# Patient Record
Sex: Female | Born: 1980 | Race: Black or African American | Hispanic: No | Marital: Married | Smoking: Never smoker
Health system: Southern US, Community
[De-identification: ages and names within clinical notes are randomized; demographics above are authoritative.]

## PROBLEM LIST (undated history)

## (undated) DIAGNOSIS — L259 Unspecified contact dermatitis, unspecified cause: Secondary | ICD-10-CM

## (undated) DIAGNOSIS — E669 Obesity, unspecified: Secondary | ICD-10-CM

## (undated) DIAGNOSIS — K469 Unspecified abdominal hernia without obstruction or gangrene: Secondary | ICD-10-CM

## (undated) DIAGNOSIS — L0501 Pilonidal cyst with abscess: Secondary | ICD-10-CM

## (undated) DIAGNOSIS — K5000 Crohn's disease of small intestine without complications: Secondary | ICD-10-CM

## (undated) DIAGNOSIS — L089 Local infection of the skin and subcutaneous tissue, unspecified: Secondary | ICD-10-CM

## (undated) DIAGNOSIS — J310 Chronic rhinitis: Secondary | ICD-10-CM

## (undated) DIAGNOSIS — N979 Female infertility, unspecified: Secondary | ICD-10-CM

## (undated) DIAGNOSIS — D649 Anemia, unspecified: Secondary | ICD-10-CM

## (undated) DIAGNOSIS — E559 Vitamin D deficiency, unspecified: Secondary | ICD-10-CM

## (undated) DIAGNOSIS — Z5189 Encounter for other specified aftercare: Secondary | ICD-10-CM

## (undated) DIAGNOSIS — B2790 Infectious mononucleosis, unspecified without complication: Secondary | ICD-10-CM

## (undated) DIAGNOSIS — K611 Rectal abscess: Secondary | ICD-10-CM

## (undated) DIAGNOSIS — K5090 Crohn's disease, unspecified, without complications: Secondary | ICD-10-CM

## (undated) DIAGNOSIS — S40812S Abrasion of left upper arm, sequela: Secondary | ICD-10-CM

## (undated) DIAGNOSIS — B019 Varicella without complication: Secondary | ICD-10-CM

## (undated) DIAGNOSIS — D219 Benign neoplasm of connective and other soft tissue, unspecified: Secondary | ICD-10-CM

## (undated) DIAGNOSIS — K432 Incisional hernia without obstruction or gangrene: Secondary | ICD-10-CM

## (undated) HISTORY — DX: Obesity, unspecified: E66.9

## (undated) HISTORY — PX: UPPER GASTROINTESTINAL ENDOSCOPY: SHX188

## (undated) HISTORY — DX: Female infertility, unspecified: N97.9

## (undated) HISTORY — DX: Infectious mononucleosis, unspecified without complication: B27.90

## (undated) HISTORY — DX: Vitamin D deficiency, unspecified: E55.9

## (undated) HISTORY — DX: Crohn's disease of small intestine without complications: K50.00

## (undated) HISTORY — DX: Unspecified contact dermatitis, unspecified cause: L25.9

## (undated) HISTORY — PX: KNEE ARTHROSCOPY: SUR90

## (undated) HISTORY — PX: WISDOM TOOTH EXTRACTION: SHX21

## (undated) HISTORY — DX: Encounter for other specified aftercare: Z51.89

## (undated) HISTORY — DX: Pilonidal cyst with abscess: L05.01

## (undated) HISTORY — PX: COLONOSCOPY: SHX174

## (undated) HISTORY — PX: PILONIDAL CYST EXCISION: SHX744

## (undated) HISTORY — DX: Rectal abscess: K61.1

## (undated) HISTORY — DX: Varicella without complication: B01.9

## (undated) HISTORY — DX: Incisional hernia without obstruction or gangrene: K43.2

## (undated) HISTORY — DX: Unspecified abdominal hernia without obstruction or gangrene: K46.9

## (undated) HISTORY — DX: Benign neoplasm of connective and other soft tissue, unspecified: D21.9

## (undated) HISTORY — DX: Crohn's disease, unspecified, without complications: K50.90

## (undated) HISTORY — DX: Chronic rhinitis: J31.0

---

## 2002-09-11 ENCOUNTER — Ambulatory Visit (HOSPITAL_BASED_OUTPATIENT_CLINIC_OR_DEPARTMENT_OTHER): Admission: RE | Admit: 2002-09-11 | Discharge: 2002-09-11 | Payer: Self-pay | Admitting: Ophthalmology

## 2004-11-25 HISTORY — PX: APPENDECTOMY: SHX54

## 2005-01-15 ENCOUNTER — Inpatient Hospital Stay (HOSPITAL_COMMUNITY): Admission: EM | Admit: 2005-01-15 | Discharge: 2005-01-28 | Payer: Self-pay | Admitting: Emergency Medicine

## 2005-01-18 ENCOUNTER — Ambulatory Visit: Payer: Self-pay | Admitting: Internal Medicine

## 2005-02-14 ENCOUNTER — Ambulatory Visit: Payer: Self-pay | Admitting: Internal Medicine

## 2005-02-20 ENCOUNTER — Ambulatory Visit (HOSPITAL_COMMUNITY): Admission: RE | Admit: 2005-02-20 | Discharge: 2005-02-20 | Payer: Self-pay | Admitting: General Surgery

## 2005-03-16 ENCOUNTER — Encounter (INDEPENDENT_AMBULATORY_CARE_PROVIDER_SITE_OTHER): Payer: Self-pay | Admitting: Specialist

## 2005-03-16 ENCOUNTER — Ambulatory Visit: Payer: Self-pay | Admitting: Gastroenterology

## 2005-03-16 DIAGNOSIS — K5289 Other specified noninfective gastroenteritis and colitis: Secondary | ICD-10-CM

## 2005-03-27 ENCOUNTER — Ambulatory Visit: Payer: Self-pay | Admitting: Internal Medicine

## 2005-04-26 ENCOUNTER — Ambulatory Visit: Payer: Self-pay | Admitting: Gastroenterology

## 2005-05-30 ENCOUNTER — Ambulatory Visit: Payer: Self-pay | Admitting: Gastroenterology

## 2005-06-29 ENCOUNTER — Ambulatory Visit: Payer: Self-pay | Admitting: Gastroenterology

## 2005-07-03 ENCOUNTER — Ambulatory Visit: Payer: Self-pay | Admitting: Gastroenterology

## 2005-07-20 ENCOUNTER — Ambulatory Visit: Payer: Self-pay | Admitting: Gastroenterology

## 2005-08-12 ENCOUNTER — Emergency Department (HOSPITAL_COMMUNITY): Admission: EM | Admit: 2005-08-12 | Discharge: 2005-08-12 | Payer: Self-pay | Admitting: Emergency Medicine

## 2005-08-14 ENCOUNTER — Ambulatory Visit: Payer: Self-pay | Admitting: Cardiology

## 2005-08-14 ENCOUNTER — Ambulatory Visit: Payer: Self-pay | Admitting: Gastroenterology

## 2005-08-15 ENCOUNTER — Ambulatory Visit: Payer: Self-pay | Admitting: Gastroenterology

## 2005-08-16 ENCOUNTER — Ambulatory Visit (HOSPITAL_COMMUNITY): Admission: RE | Admit: 2005-08-16 | Discharge: 2005-08-16 | Payer: Self-pay | Admitting: Gastroenterology

## 2005-08-29 ENCOUNTER — Ambulatory Visit: Payer: Self-pay | Admitting: Gastroenterology

## 2005-09-10 ENCOUNTER — Encounter (HOSPITAL_COMMUNITY): Admission: RE | Admit: 2005-09-10 | Discharge: 2005-12-09 | Payer: Self-pay | Admitting: Gastroenterology

## 2005-09-11 ENCOUNTER — Ambulatory Visit: Payer: Self-pay | Admitting: Gastroenterology

## 2005-11-12 ENCOUNTER — Ambulatory Visit: Payer: Self-pay | Admitting: Gastroenterology

## 2006-01-14 ENCOUNTER — Encounter (HOSPITAL_COMMUNITY): Admission: RE | Admit: 2006-01-14 | Discharge: 2006-02-08 | Payer: Self-pay | Admitting: Gastroenterology

## 2006-02-12 ENCOUNTER — Ambulatory Visit: Payer: Self-pay | Admitting: Gastroenterology

## 2006-03-27 ENCOUNTER — Encounter (HOSPITAL_COMMUNITY): Admission: RE | Admit: 2006-03-27 | Discharge: 2006-06-25 | Payer: Self-pay | Admitting: Gastroenterology

## 2006-04-29 ENCOUNTER — Ambulatory Visit: Payer: Self-pay | Admitting: Gastroenterology

## 2006-07-11 ENCOUNTER — Encounter (HOSPITAL_COMMUNITY): Admission: RE | Admit: 2006-07-11 | Discharge: 2006-10-09 | Payer: Self-pay | Admitting: Gastroenterology

## 2006-08-20 ENCOUNTER — Ambulatory Visit: Payer: Self-pay | Admitting: Gastroenterology

## 2006-11-05 ENCOUNTER — Ambulatory Visit: Payer: Self-pay | Admitting: Gastroenterology

## 2006-11-12 ENCOUNTER — Encounter (HOSPITAL_COMMUNITY): Admission: RE | Admit: 2006-11-12 | Discharge: 2007-02-10 | Payer: Self-pay | Admitting: Gastroenterology

## 2006-11-18 ENCOUNTER — Ambulatory Visit: Payer: Self-pay | Admitting: Internal Medicine

## 2006-11-18 LAB — CONVERTED CEMR LAB
AST: 21 units/L (ref 0–37)
Basophils Absolute: 0 10*3/uL (ref 0.0–0.1)
Bilirubin Urine: NEGATIVE
Bilirubin, Direct: 0.1 mg/dL (ref 0.0–0.3)
CO2: 30 meq/L (ref 19–32)
Chloride: 113 meq/L — ABNORMAL HIGH (ref 96–112)
Cholesterol: 164 mg/dL (ref 0–200)
Creatinine, Ser: 0.8 mg/dL (ref 0.4–1.2)
Eosinophils Absolute: 0.1 10*3/uL (ref 0.0–0.6)
GFR calc non Af Amer: 94 mL/min
Glucose, Bld: 79 mg/dL (ref 70–99)
HCT: 40.2 % (ref 36.0–46.0)
Hemoglobin: 13.9 g/dL (ref 12.0–15.0)
MCHC: 34.5 g/dL (ref 30.0–36.0)
MCV: 90.6 fL (ref 78.0–100.0)
Monocytes Absolute: 0.6 10*3/uL (ref 0.2–0.7)
Neutrophils Relative %: 62.3 % (ref 43.0–77.0)
Nitrite: NEGATIVE
RDW: 12.4 % (ref 11.5–14.6)
Sodium: 143 meq/L (ref 135–145)
TSH: 0.81 microintl units/mL (ref 0.35–5.50)
Total Bilirubin: 0.7 mg/dL (ref 0.3–1.2)
Total Protein, Urine: NEGATIVE mg/dL
Total Protein: 6.3 g/dL (ref 6.0–8.3)
pH: 6 (ref 5.0–8.0)

## 2006-12-02 ENCOUNTER — Ambulatory Visit: Payer: Self-pay | Admitting: Gastroenterology

## 2007-03-12 ENCOUNTER — Encounter (HOSPITAL_COMMUNITY): Admission: RE | Admit: 2007-03-12 | Discharge: 2007-05-01 | Payer: Self-pay | Admitting: Gastroenterology

## 2007-04-01 ENCOUNTER — Ambulatory Visit: Payer: Self-pay | Admitting: Gastroenterology

## 2007-05-14 DIAGNOSIS — K5000 Crohn's disease of small intestine without complications: Secondary | ICD-10-CM | POA: Insufficient documentation

## 2007-05-19 ENCOUNTER — Encounter (HOSPITAL_COMMUNITY): Admission: RE | Admit: 2007-05-19 | Discharge: 2007-05-28 | Payer: Self-pay | Admitting: Gastroenterology

## 2007-05-30 ENCOUNTER — Ambulatory Visit: Payer: Self-pay | Admitting: Internal Medicine

## 2007-05-30 LAB — CONVERTED CEMR LAB
Basophils Absolute: 0 10*3/uL (ref 0.0–0.1)
Eosinophils Absolute: 0.1 10*3/uL (ref 0.0–0.6)
MCHC: 34.4 g/dL (ref 30.0–36.0)
MCV: 91.2 fL (ref 78.0–100.0)
Mono Screen: NEGATIVE
Monocytes Relative: 9.4 % (ref 3.0–11.0)
Platelets: 353 10*3/uL (ref 150–400)
RBC: 4.77 M/uL (ref 3.87–5.11)
RDW: 12.8 % (ref 11.5–14.6)

## 2007-06-16 ENCOUNTER — Ambulatory Visit: Payer: Self-pay | Admitting: Internal Medicine

## 2007-07-07 ENCOUNTER — Ambulatory Visit: Payer: Self-pay | Admitting: Internal Medicine

## 2007-07-07 DIAGNOSIS — L259 Unspecified contact dermatitis, unspecified cause: Secondary | ICD-10-CM

## 2007-09-19 ENCOUNTER — Ambulatory Visit: Payer: Self-pay | Admitting: Gastroenterology

## 2007-09-19 LAB — CONVERTED CEMR LAB
ALT: 19 units/L (ref 0–35)
Basophils Absolute: 0 10*3/uL (ref 0.0–0.1)
Basophils Relative: 0.2 % (ref 0.0–1.0)
CO2: 29 meq/L (ref 19–32)
Calcium: 9.4 mg/dL (ref 8.4–10.5)
Chloride: 107 meq/L (ref 96–112)
GFR calc Af Amer: 113 mL/min
GFR calc non Af Amer: 94 mL/min
Glucose, Bld: 99 mg/dL (ref 70–99)
Hemoglobin: 13.9 g/dL (ref 12.0–15.0)
Lymphocytes Relative: 30.6 % (ref 12.0–46.0)
Monocytes Relative: 10.2 % (ref 3.0–12.0)
Neutro Abs: 5.4 10*3/uL (ref 1.4–7.7)
Neutrophils Relative %: 57.8 % (ref 43.0–77.0)
RBC: 4.5 M/uL (ref 3.87–5.11)
Sodium: 142 meq/L (ref 135–145)
Total Bilirubin: 0.6 mg/dL (ref 0.3–1.2)
Total Protein: 6.6 g/dL (ref 6.0–8.3)

## 2007-09-22 ENCOUNTER — Encounter: Payer: Self-pay | Admitting: Gastroenterology

## 2007-11-13 ENCOUNTER — Encounter: Payer: Self-pay | Admitting: Internal Medicine

## 2007-12-24 ENCOUNTER — Ambulatory Visit: Payer: Self-pay | Admitting: Internal Medicine

## 2008-02-17 ENCOUNTER — Ambulatory Visit: Payer: Self-pay | Admitting: Internal Medicine

## 2008-03-05 ENCOUNTER — Encounter: Payer: Self-pay | Admitting: Gastroenterology

## 2008-04-26 ENCOUNTER — Ambulatory Visit: Payer: Self-pay | Admitting: Gastroenterology

## 2008-06-10 ENCOUNTER — Encounter (INDEPENDENT_AMBULATORY_CARE_PROVIDER_SITE_OTHER): Payer: Self-pay | Admitting: *Deleted

## 2008-06-18 ENCOUNTER — Telehealth: Payer: Self-pay | Admitting: Gastroenterology

## 2008-06-25 ENCOUNTER — Ambulatory Visit: Payer: Self-pay | Admitting: Gastroenterology

## 2008-06-25 DIAGNOSIS — K5900 Constipation, unspecified: Secondary | ICD-10-CM | POA: Insufficient documentation

## 2008-06-29 ENCOUNTER — Ambulatory Visit: Payer: Self-pay | Admitting: Gastroenterology

## 2008-07-15 ENCOUNTER — Telehealth: Payer: Self-pay | Admitting: Internal Medicine

## 2008-07-16 ENCOUNTER — Ambulatory Visit: Payer: Self-pay | Admitting: Internal Medicine

## 2008-07-17 ENCOUNTER — Encounter: Payer: Self-pay | Admitting: Internal Medicine

## 2008-07-23 ENCOUNTER — Telehealth: Payer: Self-pay | Admitting: Internal Medicine

## 2008-08-03 ENCOUNTER — Ambulatory Visit: Payer: Self-pay | Admitting: Internal Medicine

## 2008-08-03 LAB — CONVERTED CEMR LAB
Basophils Absolute: 0.1 10*3/uL (ref 0.0–0.1)
Eosinophils Absolute: 0.3 10*3/uL (ref 0.0–0.7)
Lymphocytes Relative: 24.8 % (ref 12.0–46.0)
MCHC: 34 g/dL (ref 30.0–36.0)
MCV: 92.4 fL (ref 78.0–100.0)
Neutrophils Relative %: 67.9 % (ref 43.0–77.0)
Platelets: 299 10*3/uL (ref 150–400)
RBC: 4.39 M/uL (ref 3.87–5.11)
RDW: 12.9 % (ref 11.5–14.6)

## 2008-08-05 ENCOUNTER — Telehealth: Payer: Self-pay | Admitting: Internal Medicine

## 2008-08-06 ENCOUNTER — Telehealth: Payer: Self-pay | Admitting: Gastroenterology

## 2008-08-16 ENCOUNTER — Telehealth (INDEPENDENT_AMBULATORY_CARE_PROVIDER_SITE_OTHER): Payer: Self-pay | Admitting: *Deleted

## 2008-08-26 ENCOUNTER — Telehealth: Payer: Self-pay | Admitting: Gastroenterology

## 2008-09-03 ENCOUNTER — Ambulatory Visit: Payer: Self-pay | Admitting: Internal Medicine

## 2008-09-03 DIAGNOSIS — L52 Erythema nodosum: Secondary | ICD-10-CM

## 2008-09-03 DIAGNOSIS — J351 Hypertrophy of tonsils: Secondary | ICD-10-CM | POA: Insufficient documentation

## 2008-09-07 ENCOUNTER — Ambulatory Visit: Payer: Self-pay | Admitting: Internal Medicine

## 2008-09-10 ENCOUNTER — Telehealth (INDEPENDENT_AMBULATORY_CARE_PROVIDER_SITE_OTHER): Payer: Self-pay | Admitting: *Deleted

## 2008-09-15 ENCOUNTER — Telehealth: Payer: Self-pay | Admitting: Internal Medicine

## 2008-09-15 ENCOUNTER — Encounter: Payer: Self-pay | Admitting: Internal Medicine

## 2008-09-28 ENCOUNTER — Encounter (INDEPENDENT_AMBULATORY_CARE_PROVIDER_SITE_OTHER): Payer: Self-pay | Admitting: *Deleted

## 2008-11-23 ENCOUNTER — Telehealth: Payer: Self-pay | Admitting: Gastroenterology

## 2008-11-25 ENCOUNTER — Encounter: Payer: Self-pay | Admitting: Gastroenterology

## 2008-11-30 ENCOUNTER — Ambulatory Visit: Payer: Self-pay | Admitting: Gastroenterology

## 2008-12-15 ENCOUNTER — Telehealth (INDEPENDENT_AMBULATORY_CARE_PROVIDER_SITE_OTHER): Payer: Self-pay | Admitting: *Deleted

## 2009-01-10 ENCOUNTER — Telehealth: Payer: Self-pay | Admitting: Gastroenterology

## 2009-01-11 DIAGNOSIS — R109 Unspecified abdominal pain: Secondary | ICD-10-CM

## 2009-01-12 ENCOUNTER — Telehealth (INDEPENDENT_AMBULATORY_CARE_PROVIDER_SITE_OTHER): Payer: Self-pay | Admitting: *Deleted

## 2009-01-12 ENCOUNTER — Encounter: Payer: Self-pay | Admitting: Gastroenterology

## 2009-01-12 ENCOUNTER — Ambulatory Visit (HOSPITAL_COMMUNITY): Admission: RE | Admit: 2009-01-12 | Discharge: 2009-01-12 | Payer: Self-pay | Admitting: Gastroenterology

## 2009-01-13 ENCOUNTER — Ambulatory Visit: Payer: Self-pay | Admitting: Gastroenterology

## 2009-01-13 DIAGNOSIS — K5090 Crohn's disease, unspecified, without complications: Secondary | ICD-10-CM | POA: Insufficient documentation

## 2009-01-14 ENCOUNTER — Telehealth (INDEPENDENT_AMBULATORY_CARE_PROVIDER_SITE_OTHER): Payer: Self-pay | Admitting: *Deleted

## 2009-01-14 ENCOUNTER — Encounter: Payer: Self-pay | Admitting: Gastroenterology

## 2009-01-14 LAB — CONVERTED CEMR LAB
Basophils Absolute: 0 10*3/uL (ref 0.0–0.1)
Basophils Relative: 0.1 % (ref 0.0–3.0)
Eosinophils Relative: 1.1 % (ref 0.0–5.0)
HCT: 42.2 % (ref 36.0–46.0)
Hemoglobin: 14.1 g/dL (ref 12.0–15.0)
Lymphocytes Relative: 15.4 % (ref 12.0–46.0)
Lymphs Abs: 1.8 10*3/uL (ref 0.7–4.0)
Monocytes Relative: 7.8 % (ref 3.0–12.0)
Neutro Abs: 8.7 10*3/uL — ABNORMAL HIGH (ref 1.4–7.7)
RBC: 4.77 M/uL (ref 3.87–5.11)
RDW: 12.8 % (ref 11.5–14.6)
WBC: 11.5 10*3/uL — ABNORMAL HIGH (ref 4.5–10.5)

## 2009-02-08 ENCOUNTER — Ambulatory Visit: Payer: Self-pay | Admitting: Gastroenterology

## 2009-03-14 ENCOUNTER — Encounter: Payer: Self-pay | Admitting: Gastroenterology

## 2009-03-22 ENCOUNTER — Ambulatory Visit: Payer: Self-pay | Admitting: Gastroenterology

## 2009-04-25 ENCOUNTER — Ambulatory Visit: Payer: Self-pay | Admitting: Gastroenterology

## 2009-04-25 ENCOUNTER — Telehealth: Payer: Self-pay | Admitting: Gastroenterology

## 2009-04-27 LAB — CONVERTED CEMR LAB
BUN: 4 mg/dL — ABNORMAL LOW (ref 6–23)
Basophils Relative: 0 % (ref 0.0–3.0)
Chloride: 102 meq/L (ref 96–112)
Creatinine, Ser: 0.7 mg/dL (ref 0.4–1.2)
Eosinophils Relative: 0.7 % (ref 0.0–5.0)
GFR calc non Af Amer: 130.04 mL/min (ref >60)
Glucose, Bld: 85 mg/dL (ref 70–99)
Hemoglobin: 13.3 g/dL (ref 12.0–15.0)
MCV: 90.3 fL (ref 78.0–100.0)
Monocytes Absolute: 1 10*3/uL (ref 0.1–1.0)
Neutro Abs: 8.6 10*3/uL — ABNORMAL HIGH (ref 1.4–7.7)
Neutrophils Relative %: 76.1 % (ref 43.0–77.0)
Potassium: 3.8 meq/L (ref 3.5–5.1)
RBC: 4.32 M/uL (ref 3.87–5.11)
WBC: 11.3 10*3/uL — ABNORMAL HIGH (ref 4.5–10.5)

## 2009-04-29 ENCOUNTER — Ambulatory Visit: Payer: Self-pay | Admitting: Internal Medicine

## 2009-04-29 DIAGNOSIS — R1031 Right lower quadrant pain: Secondary | ICD-10-CM

## 2009-05-02 ENCOUNTER — Encounter: Payer: Self-pay | Admitting: Nurse Practitioner

## 2009-05-02 ENCOUNTER — Telehealth: Payer: Self-pay | Admitting: Nurse Practitioner

## 2009-05-05 ENCOUNTER — Telehealth: Payer: Self-pay | Admitting: Gastroenterology

## 2009-05-06 ENCOUNTER — Ambulatory Visit (HOSPITAL_COMMUNITY): Admission: RE | Admit: 2009-05-06 | Discharge: 2009-05-06 | Payer: Self-pay | Admitting: Gastroenterology

## 2009-05-06 ENCOUNTER — Telehealth: Payer: Self-pay | Admitting: Gastroenterology

## 2009-05-06 LAB — CONVERTED CEMR LAB
AST: 20 units/L (ref 0–37)
Albumin: 2.8 g/dL — ABNORMAL LOW (ref 3.5–5.2)
Alkaline Phosphatase: 44 units/L (ref 39–117)
Total Protein: 7.5 g/dL (ref 6.0–8.3)

## 2009-05-09 ENCOUNTER — Ambulatory Visit: Payer: Self-pay | Admitting: Cardiovascular Disease

## 2009-05-09 ENCOUNTER — Telehealth: Payer: Self-pay | Admitting: Gastroenterology

## 2009-05-09 ENCOUNTER — Telehealth (INDEPENDENT_AMBULATORY_CARE_PROVIDER_SITE_OTHER): Payer: Self-pay | Admitting: *Deleted

## 2009-05-10 ENCOUNTER — Inpatient Hospital Stay (HOSPITAL_COMMUNITY): Admission: AD | Admit: 2009-05-10 | Discharge: 2009-05-23 | Payer: Self-pay | Admitting: Gastroenterology

## 2009-05-10 ENCOUNTER — Ambulatory Visit: Payer: Self-pay | Admitting: Gastroenterology

## 2009-05-23 ENCOUNTER — Telehealth (INDEPENDENT_AMBULATORY_CARE_PROVIDER_SITE_OTHER): Payer: Self-pay | Admitting: *Deleted

## 2009-05-26 ENCOUNTER — Ambulatory Visit: Payer: Self-pay | Admitting: Gastroenterology

## 2009-05-26 ENCOUNTER — Inpatient Hospital Stay (HOSPITAL_COMMUNITY): Admission: RE | Admit: 2009-05-26 | Discharge: 2009-06-02 | Payer: Self-pay | Admitting: Gastroenterology

## 2009-05-26 ENCOUNTER — Telehealth: Payer: Self-pay | Admitting: Gastroenterology

## 2009-05-26 DIAGNOSIS — R935 Abnormal findings on diagnostic imaging of other abdominal regions, including retroperitoneum: Secondary | ICD-10-CM | POA: Insufficient documentation

## 2009-05-26 DIAGNOSIS — K5080 Crohn's disease of both small and large intestine without complications: Secondary | ICD-10-CM

## 2009-05-28 HISTORY — PX: OTHER SURGICAL HISTORY: SHX169

## 2009-06-02 ENCOUNTER — Encounter: Payer: Self-pay | Admitting: Gastroenterology

## 2009-06-07 ENCOUNTER — Ambulatory Visit: Payer: Self-pay | Admitting: Gastroenterology

## 2009-06-21 ENCOUNTER — Inpatient Hospital Stay (HOSPITAL_COMMUNITY): Admission: RE | Admit: 2009-06-21 | Discharge: 2009-06-27 | Payer: Self-pay | Admitting: General Surgery

## 2009-06-21 ENCOUNTER — Encounter (INDEPENDENT_AMBULATORY_CARE_PROVIDER_SITE_OTHER): Payer: Self-pay | Admitting: General Surgery

## 2009-07-12 ENCOUNTER — Ambulatory Visit: Payer: Self-pay | Admitting: Gastroenterology

## 2009-07-19 ENCOUNTER — Encounter (INDEPENDENT_AMBULATORY_CARE_PROVIDER_SITE_OTHER): Payer: Self-pay | Admitting: *Deleted

## 2009-07-19 ENCOUNTER — Telehealth: Payer: Self-pay | Admitting: Gastroenterology

## 2009-08-09 ENCOUNTER — Encounter: Payer: Self-pay | Admitting: Gastroenterology

## 2009-08-23 ENCOUNTER — Encounter: Payer: Self-pay | Admitting: Gastroenterology

## 2009-08-26 ENCOUNTER — Telehealth (INDEPENDENT_AMBULATORY_CARE_PROVIDER_SITE_OTHER): Payer: Self-pay | Admitting: *Deleted

## 2009-08-30 ENCOUNTER — Encounter (INDEPENDENT_AMBULATORY_CARE_PROVIDER_SITE_OTHER): Payer: Self-pay | Admitting: *Deleted

## 2009-09-05 ENCOUNTER — Encounter: Payer: Self-pay | Admitting: Gastroenterology

## 2009-09-09 ENCOUNTER — Ambulatory Visit: Payer: Self-pay | Admitting: Gastroenterology

## 2009-09-12 ENCOUNTER — Telehealth (INDEPENDENT_AMBULATORY_CARE_PROVIDER_SITE_OTHER): Payer: Self-pay | Admitting: *Deleted

## 2009-09-23 ENCOUNTER — Telehealth: Payer: Self-pay | Admitting: Gastroenterology

## 2009-10-26 ENCOUNTER — Ambulatory Visit: Payer: Self-pay | Admitting: Gastroenterology

## 2009-11-03 ENCOUNTER — Encounter: Payer: Self-pay | Admitting: Gastroenterology

## 2009-11-15 ENCOUNTER — Encounter (INDEPENDENT_AMBULATORY_CARE_PROVIDER_SITE_OTHER): Payer: Self-pay | Admitting: *Deleted

## 2009-12-06 ENCOUNTER — Encounter: Payer: Self-pay | Admitting: Gastroenterology

## 2010-02-23 ENCOUNTER — Encounter: Payer: Self-pay | Admitting: Internal Medicine

## 2010-02-24 ENCOUNTER — Ambulatory Visit: Payer: Self-pay | Admitting: Internal Medicine

## 2010-02-24 ENCOUNTER — Telehealth: Payer: Self-pay | Admitting: Gastroenterology

## 2010-02-24 ENCOUNTER — Telehealth: Payer: Self-pay | Admitting: *Deleted

## 2010-02-25 ENCOUNTER — Ambulatory Visit: Payer: Self-pay | Admitting: Family Medicine

## 2010-02-27 ENCOUNTER — Telehealth: Payer: Self-pay | Admitting: *Deleted

## 2010-03-03 ENCOUNTER — Ambulatory Visit: Payer: Self-pay | Admitting: Internal Medicine

## 2010-03-27 ENCOUNTER — Telehealth (INDEPENDENT_AMBULATORY_CARE_PROVIDER_SITE_OTHER): Payer: Self-pay | Admitting: *Deleted

## 2010-03-28 ENCOUNTER — Ambulatory Visit: Payer: Self-pay | Admitting: Internal Medicine

## 2010-04-07 ENCOUNTER — Ambulatory Visit: Payer: Self-pay | Admitting: Internal Medicine

## 2010-04-07 LAB — CONVERTED CEMR LAB
Albumin: 3.2 g/dL — ABNORMAL LOW (ref 3.5–5.2)
Alkaline Phosphatase: 55 units/L (ref 39–117)
BUN: 16 mg/dL (ref 6–23)
Basophils Relative: 0.7 % (ref 0.0–3.0)
Calcium: 8.7 mg/dL (ref 8.4–10.5)
Cholesterol: 154 mg/dL (ref 0–200)
Creatinine, Ser: 0.8 mg/dL (ref 0.4–1.2)
Eosinophils Absolute: 0.1 10*3/uL (ref 0.0–0.7)
GFR calc non Af Amer: 109.08 mL/min (ref >60)
HCT: 38.1 % (ref 36.0–46.0)
Hemoglobin: 12.8 g/dL (ref 12.0–15.0)
LDL Cholesterol: 92 mg/dL (ref 0–99)
Lymphocytes Relative: 33.9 % (ref 12.0–46.0)
MCHC: 33.6 g/dL (ref 30.0–36.0)
MCV: 88.5 fL (ref 78.0–100.0)
Neutro Abs: 4.4 10*3/uL (ref 1.4–7.7)
Nitrite: NEGATIVE
RBC: 4.31 M/uL (ref 3.87–5.11)
Specific Gravity, Urine: <1.005
Total Protein: 6.9 g/dL (ref 6.0–8.3)
WBC Urine, dipstick: NEGATIVE

## 2010-04-07 IMAGING — CT CT ABCESS DRAINAGE
1 of 4 series · 13 of 32 positions shown, 19 images · non-contrast
Comparison: none

CLINICAL DATA: Crohn's disease, recurrent right lower quadrant
subcutaneous abdominal wall abscess

[Series 2: rtn ap without · axial · non-contrast · 0.74mm/px · z∈[-484,-419]mm · 13 of 17 slices shown, 19 images]
[im 2/17  soft-tissue]
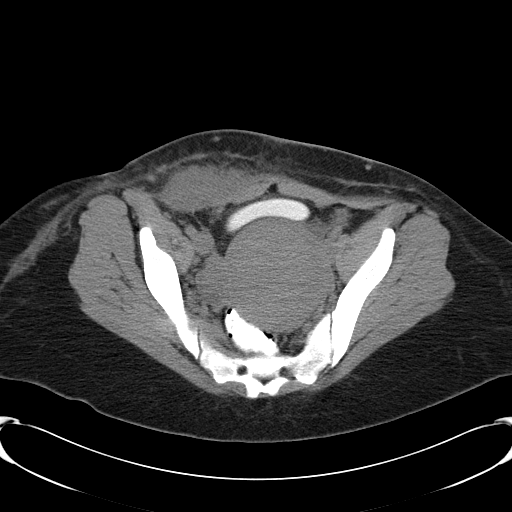
[im 2/17  bone]
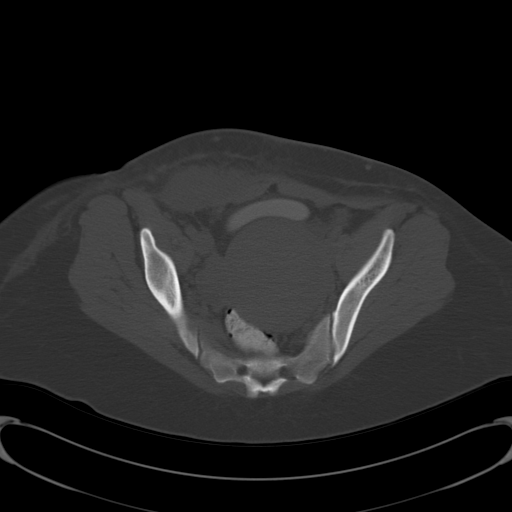
[im 3/17  soft-tissue]
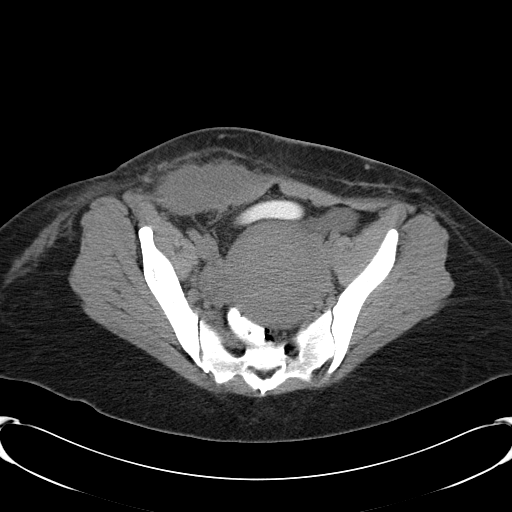
[im 4/17  soft-tissue]
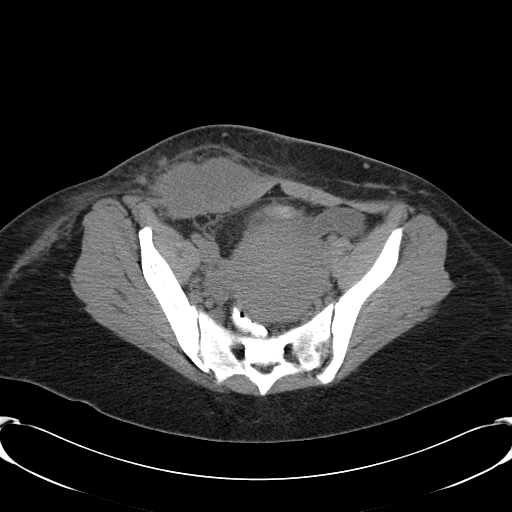
[im 5/17  soft-tissue]
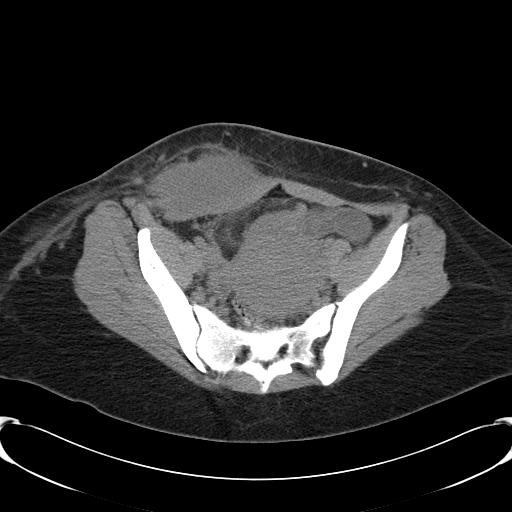
[im 6/17  soft-tissue]
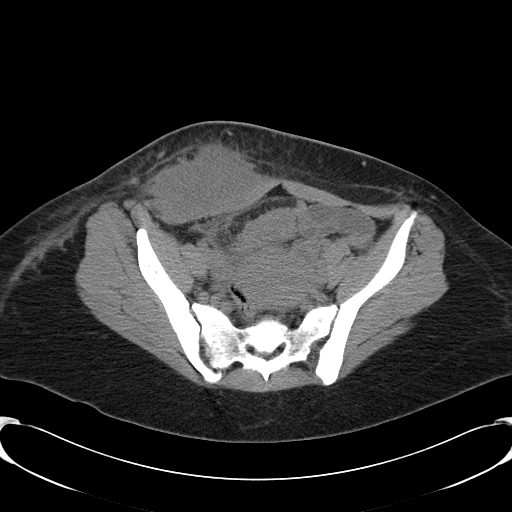
[im 7/17  soft-tissue]
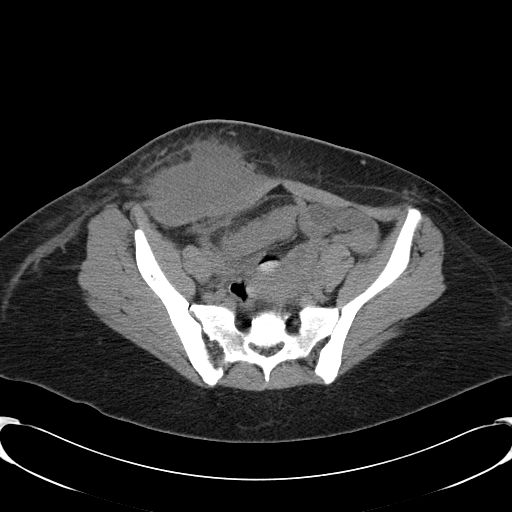
[im 9/17  soft-tissue]
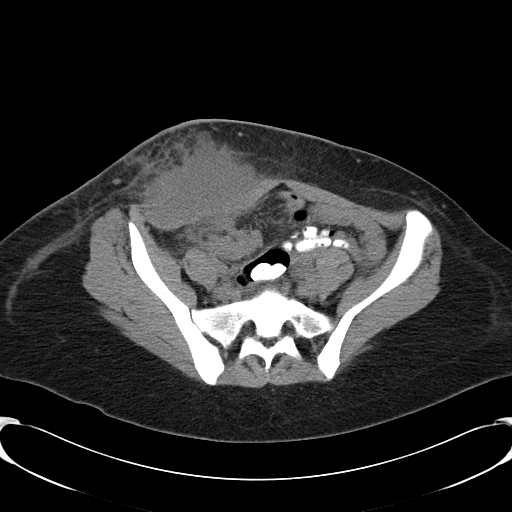
[im 10/17  soft-tissue]
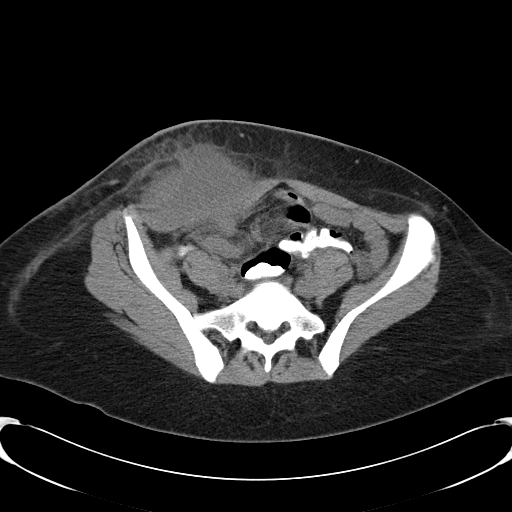
[im 11/17  soft-tissue]
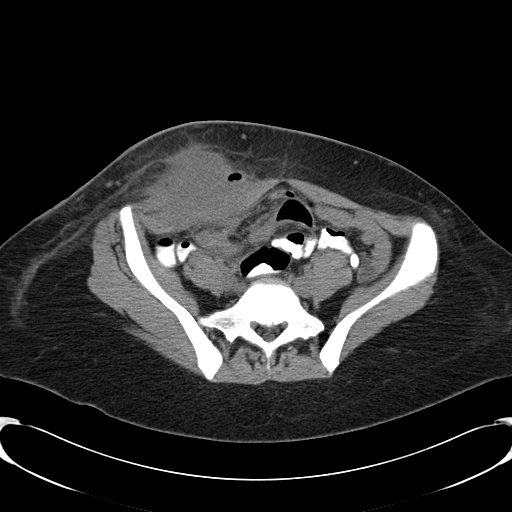
[im 11/17  bone]
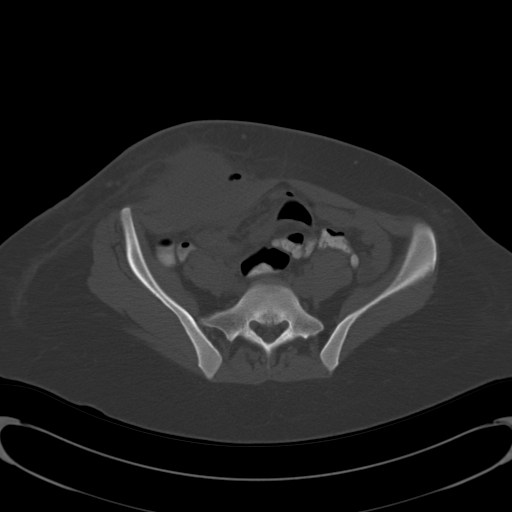
[im 12/17  soft-tissue]
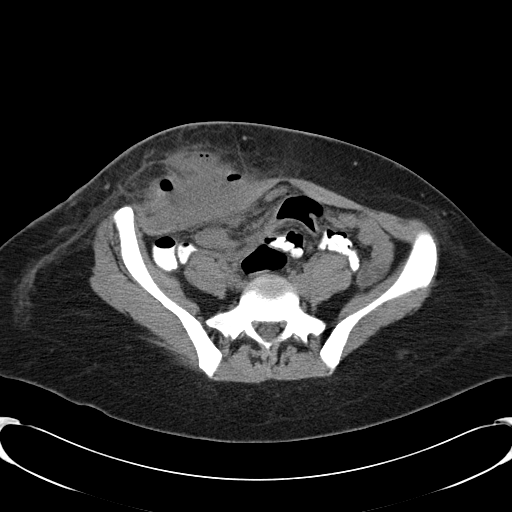
[im 12/17  lung]
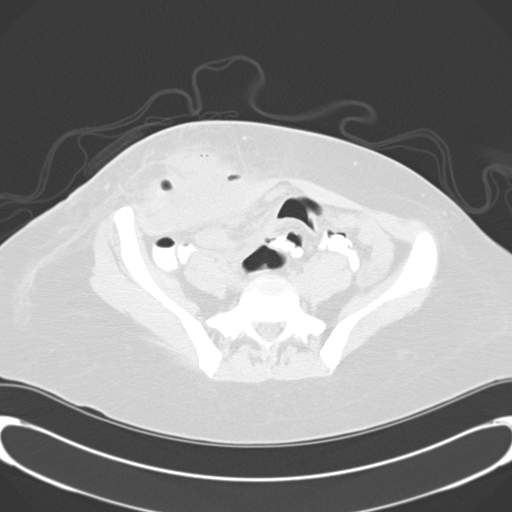
[im 13/17  soft-tissue]
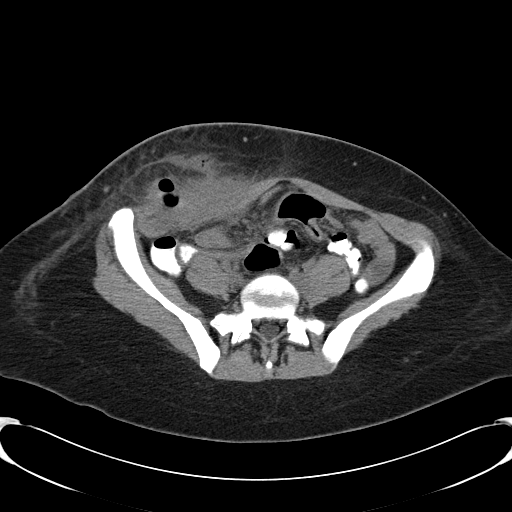
[im 13/17  lung]
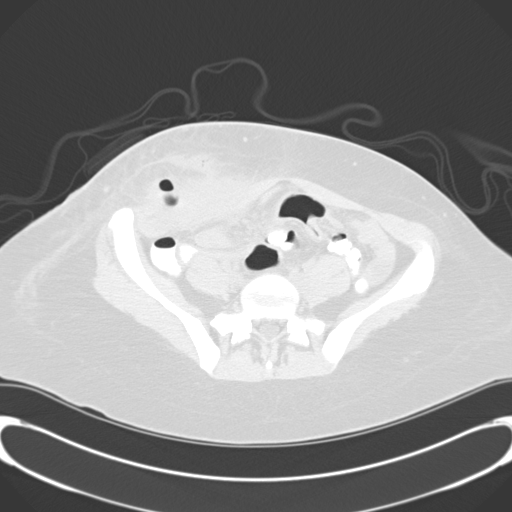
[im 14/17  soft-tissue]
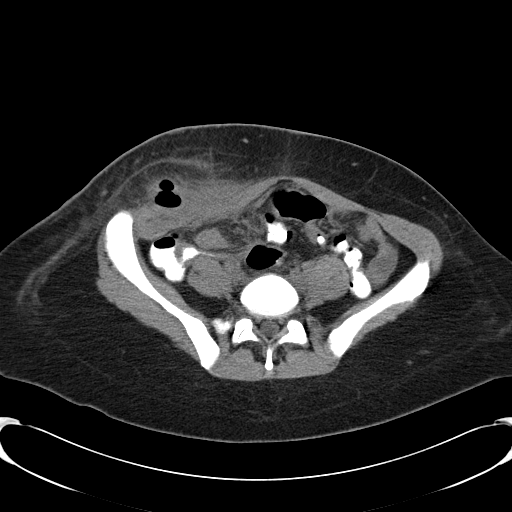
[im 14/17  lung]
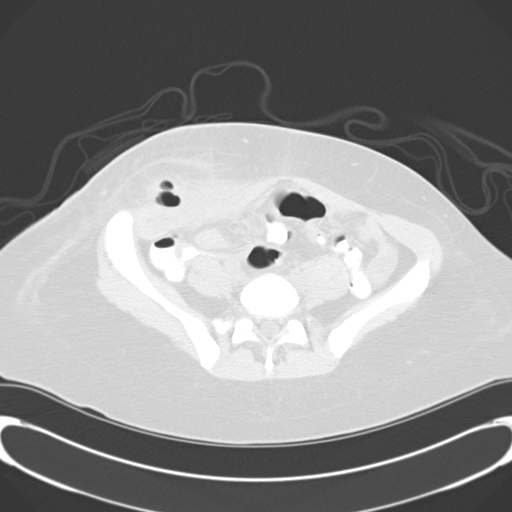
[im 15/17  soft-tissue]
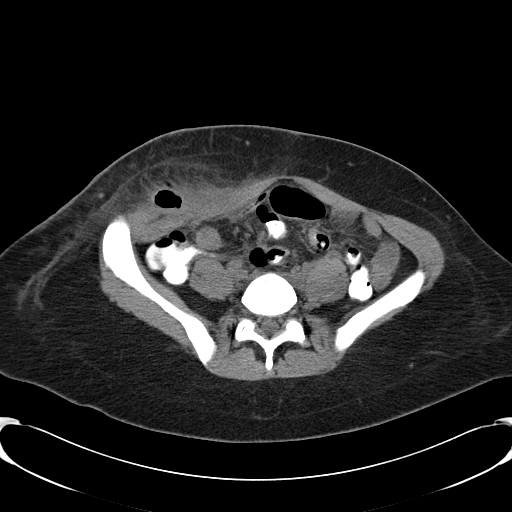
[im 15/17  lung]
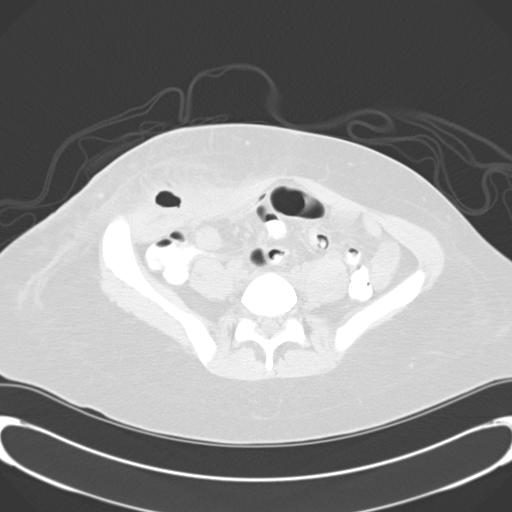

[13 of 32 positions shown; findings below may reference images not displayed]

CT GUIDED RIGHT LOWER QUADRANT ABSCESS DRAINAGE

Date:  05/27/2009 [DATE]

Radiologist:  Anar Makanova, M.D.

Medications:  2 mg Versed, 100 mcg Fentanyl

Guidance:  CT

Fluoroscopy time:  None.

Sedation time:  15 minutes

Contrast volume:  None.

Complications:  No immediate

PROCEDURE/FINDINGS:

Informed consent was obtained from the patient following
explanation of the procedure, risks, benefits and alternatives.
The patient understands, agrees and consents for the procedure.
All questions were addressed.  A time out was performed.

Maximal barrier sterile technique utilized including caps, mask,
sterile gowns, sterile gloves, large sterile drape, hand hygiene,
and betadine

Previous CT scan was reviewed demonstrating a recurrent right lower
quadrant subcutaneous abscess.

The patient was positioned supine.  Noncontrast localization CT was
performed through the abscess in the lower abdomen.  Under sterile
conditions and local anesthesia, an 18 gauge 10 cm access needle
was advanced from a right lower quadrant anterior oblique approach.
There was return of exudative fluid.  The guide wire was inserted
followed by tract dilatation to advance a 12-French drain.
Drainage catheter position in the abscess cavity was confirmed with
CT imaging.  Syringe aspiration yielded 100 ml of exudative fluid.
Samples sent for Gram stain culture.  Catheter secured with a
Prolene suture and connected to external drainage.  Sterile
dressing applied.  No immediate complication.  The patient
tolerated the procedure well.
IMPRESSION: CT guided right lower quadrant subcutaneous abscess drainage.

## 2010-04-18 ENCOUNTER — Ambulatory Visit: Payer: Self-pay | Admitting: Internal Medicine

## 2010-04-18 DIAGNOSIS — G4710 Hypersomnia, unspecified: Secondary | ICD-10-CM | POA: Insufficient documentation

## 2010-04-18 DIAGNOSIS — D259 Leiomyoma of uterus, unspecified: Secondary | ICD-10-CM

## 2010-04-18 DIAGNOSIS — L0501 Pilonidal cyst with abscess: Secondary | ICD-10-CM

## 2010-04-18 DIAGNOSIS — R946 Abnormal results of thyroid function studies: Secondary | ICD-10-CM

## 2010-05-02 ENCOUNTER — Encounter: Payer: Self-pay | Admitting: Gastroenterology

## 2010-05-16 ENCOUNTER — Encounter: Payer: Self-pay | Admitting: Internal Medicine

## 2010-05-18 ENCOUNTER — Telehealth: Payer: Self-pay | Admitting: Internal Medicine

## 2010-05-18 ENCOUNTER — Ambulatory Visit: Payer: Self-pay | Admitting: Internal Medicine

## 2010-05-18 DIAGNOSIS — G479 Sleep disorder, unspecified: Secondary | ICD-10-CM | POA: Insufficient documentation

## 2010-06-18 ENCOUNTER — Encounter: Payer: Self-pay | Admitting: Gastroenterology

## 2010-06-27 NOTE — Progress Notes (Signed)
Summary: TB skin test   Phone Note Outgoing Call Call back at [facility] Phone [phone]   Call placed by: Christian Mate CMA Deborra Medina),  September 12, 2009 2:57 PM Summary of Call: I called pt to remind her to come in and have her TB skin test read.  left message on machine to call back  Initial call taken by: Christian Mate CMA Deborra Medina),  September 12, 2009 2:58 PM

## 2010-06-27 NOTE — Miscellaneous (Signed)
Summary: [facility]   Imported By: Bubba Hales 08/15/2009 08:41:38  _____________________________________________________________________  External Attachment:    Type:   Image     Comment:   External Document

## 2010-06-27 NOTE — Letter (Signed)
Summary: [facility]   Imported By: Phillis Knack 01/06/2010 09:11:45  _____________________________________________________________________  External Attachment:    Type:   Image     Comment:   External Document

## 2010-06-27 NOTE — Progress Notes (Signed)
Summary: Return to work letter   Phone Note Call from Patient   Summary of Call: Johna walked in and is requesting a note to return to work on March 1.  Can I give this to her? Initial call taken by: Christian Mate CMA Deborra Medina),  July 19, 2009 3:13 PM  Follow-up for Phone Call        yes Follow-up by: Milus Banister MD,  July 19, 2009 3:26 PM

## 2010-06-27 NOTE — Assessment & Plan Note (Signed)
Summary: follow up on pneumonia/ssc   Vital Signs:  Patient profile:   30 year old female Menstrual status:  regular Weight:      131 pounds O2 Sat:      98 % on Room air Temp:     98.3 degrees F oral Pulse rate:   99 / minute BP sitting:   120 / 80  (left arm) Cuff size:   regular  Vitals Entered By: Sherron Monday, CMA (AAMA) (March 03, 2010 9:50 AM)  O2 Flow:  Room air CC: Follow-up visit on pneumonia. Pt states that she is feeling better but has a few coughing spells at night.   History of Present Illness: [name] comes in today  for follow up of pneumonia LUL  rx with levaquin.  is much better in energy 85% improved. Only cough at end of day 6 pm after work but not productive . had one episode of slight blood ( see phone note)    early on but none since. Back to work.  Asks about BF who is in chemo for lymphoma but doing ok. Next remicade in November per patient.    Preventive Screening-Counseling & Management  Alcohol-Tobacco     Alcohol drinks/day: 0     Smoking Status: never  Caffeine-Diet-Exercise     Caffeine use/day: 0     Does Patient Exercise: yes     Type of exercise: walking and aerobics     Exercise (avg: min/session): >60     Times/week: 2  Current Medications (verified): 1)  Multivitamins   Tabs (Multiple Vitamin) .... Take One By Mouth Once Daily 2)  Tri-Sprintec 0.18/0.215/0.25 Mg-35 Mcg Tabs (Norgestim-Eth Estrad Triphasic) .... As Directed 3)  Remicade 100 Mg Solr (Infliximab) .[name].. 61m/kg Every 8 Weeks  Allergies (verified): No Known Drug Allergies  Past History:  Past medical, surgical, family and social histories (including risk factors) reviewed for relevance to current acute and chronic problems.  Past Medical History: Reviewed history from 02/24/2010 and no changes required. RHINITIS /? 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 JElwyn ReachDr. Rowe-Rheu  Dr. RRiley NearingFoot specialist  Past Surgical History: Reviewed history from 02/24/2010 and no changes required. Knee Arthroscopy1998,1999,2002 Appendectomy ileocecal resection Jan 2011  Past History:  Care Management: Gastroenterology: JArdis HughsGynecology: RMancel BaleRheumatology:[place] Podiatry: UMarena Chancy  Family History: Reviewed history from 09/03/2008 and no changes required. Mother deceased secondary to brain aneurysm Father is age 6871and fairly healthy H30sister with diabetes and bipolar disorder Maternal grandmother with colon cancer Paternal grandfather with stomach cancer   Social History: Reviewed history from 09/03/2008 and no changes required. Single 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 in [employer]   Never Smoked Alcohol use-no Drug use-no hhof 1 no pets  sleep 6-7 hours           BF with lymphoma  on chemo. walking exercise.   Eats balanced   generally.   Review of Systems  The patient denies anorexia, fever, weight loss, weight gain, vision loss, peripheral edema, prolonged cough, headaches, hemoptysis, abdominal pain, abnormal bleeding, enlarged lymph nodes, and angioedema.    Physical Exam  General:  Well-developed,well-nourished,in no acute distress; alert,appropriate and cooperative throughout examination Neck:  No deformities, masses, or tenderness noted. Lungs:  Normal respiratory effort, chest expands  symmetrically. Lungs are clear to auscultation, no crackles or wheezes.no dullness.   Heart:  normal rate, regular rhythm, and no murmur.   Skin:  turgor normal and color normal.   Cervical Nodes:  No lymphadenopathy noted Psych:  Oriented X3, good eye contact, and not anxious appearing.     Impression &  Recommendations:  Problem # 1:  PNEUMONIA, LEFT UPPER LOBE (ICD-486) Assessment Improved much improved   slight decrease in energy and minor end of day cough .   but no systemic signs .   disc getting flu shot  ( usually gets after remicade ? novemnber )   at this time no need for follow up cxray as long as clinically improving  .  close follow up  discussed of any relapsing signs . Will reassess at  [facility].  The following medications were removed from the medication list:    Levaquin 750 Mg Tabs (Levofloxacin) .[name]... 1 by mouth once daily  Instructed patient to complete antibiotics, and call for worsened shortness of breath or new symptoms.   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 Take one by mouth once daily 2)  Tri-sprintec 0.18/0.215/0.25 Mg-35 Mcg Tabs (Norgestim-eth estrad triphasic) .... As directed 3)  Remicade 100 Mg Solr (Infliximab) .[name].. 4m/kg every 8 weeks  Appended Document: follow up on pneumonia/ssc thanks for forwarding this to me. Remicade probably puts her at risk for infections such as this pneumonia.  It is controlling her crohn's however, and I think she needs to stay on it. I will discuss perhaps lowering her dose a bit at next visit.

## 2010-06-27 NOTE — Letter (Signed)
Summary: Return to Work  [facility]  [address] [place], [place] [address]   Phone: [phone]  Fax: [phone]    07/19/2009  TO: Dorathy Daft IT MAY CONCERN  RE: [name] [address]   The above named individual is under my medical care and may return to work on:  July 26, 2009.    If you have any further questions or need additional information, please call.     Sincerely,   Owens Loffler MD typed by: Christian Mate CMA Deborra Medina)  Appended Document: Return to Work left at front desk for pt

## 2010-06-27 NOTE — Progress Notes (Signed)
Summary: Rectal abcess   Phone Note Call from Patient Call back at Home Phone [phone]   Caller: Patient Summary of Call: patient needs Dr. Theodis Shove to know she has a rectal abcess and she is on Doxycycline 100 take one by mouth two times a day x 10days (i added it to the med list) Initial call taken by: Bernita Buffy CMA Deborra Medina),  September 23, 2009 9:09 AM  Follow-up for Phone Call        Dr Jac Canavan:  Lamar Blinks just wanted you to be aware and see if you wanted anything else done for her. Follow-up by: Christian Mate CMA Deborra Medina),  September 23, 2009 9:27 AM  Additional Follow-up for Phone Call Additional follow up Details #1::        she needs rov with me in next 1-2 weeks Additional Follow-up by: Milus Banister MD,  Sep 26, 2009 7:24 AM    Additional Follow-up for Phone Call Additional follow up Details #2::    left message on machine to call back  [place] Deborra Medina)  Sep 26, 2009 8:29 AM   Spoke with the pt and she will call when she gets home from work and schedule the appt. Follow-up by: Christian Mate CMA Deborra Medina),  Sep 27, 2009 8:53 AM  New/Updated Medications: DOXYCYCLINE HYCLATE 100 MG CAPS (DOXYCYCLINE HYCLATE) take one by mouth two times a day X 10days

## 2010-06-27 NOTE — Assessment & Plan Note (Signed)
Summary: CPX/CJR   Vital Signs:  Patient profile:   30 year old female Menstrual status:  regular Weight:      189 pounds Temp:     98.5 degrees F oral Pulse rate:   60 / minute BP sitting:   120 / 80  (left arm) Cuff size:   regular  Vitals Entered By: Sherron Monday, CMA (AAMA) (April 18, 2010 8:58 AM) CC: Cyst on rt side of buttock inside. Pain started over the weekend and is now red and swollen. Hurts to set.   History of Present Illness: [name] comes in today  early for her appt as a walk in for  new onset of 2 days or buttock pain and  poss abscess.  No fever . hurts to sit NO idarrhea .   Didn get her remicade this am because of this.  she states she does not think recurrences flaring no fever change in bowel habits.  her respiratory infection see previous notes is resolved.  Has hx of I&D and packing  in April at an urgent care and then follow up with surgeon who said to follow and poss other intervention if recurring.  Her surgeon is Dr Zenia Resides.  Other concerns.  Issue with sleep ever since crohns surgery. can fall aslep but mind races in middle of night and acnt fo back . better on vacation other days.  NO alcohol or  issues .      Preventive Screening-Counseling & Management  Alcohol-Tobacco     Alcohol drinks/day: 0     Smoking Status: never  Caffeine-Diet-Exercise     Caffeine use/day: 0     Does Patient Exercise: yes     Type of exercise: walking and aerobics     Exercise (avg: min/session): >60     Times/week: 2  Current Medications (verified): 1)  Multivitamins   Tabs (Multiple Vitamin) .... Take One By Mouth Once Daily 2)  Tri-Sprintec 0.18/0.215/0.25 Mg-35 Mcg Tabs (Norgestim-Eth Estrad Triphasic) .... As Directed 3)  Remicade 100 Mg Solr (Infliximab) .[name].. 62m/kg Every 8 Weeks  Allergies (verified): No Known Drug Allergies  Past History:  Past medical, surgical, family and social histories (including risk factors) reviewed, and no changes  noted (except as noted below).  Past Medical History: Reviewed history from 03/28/2010 and no changes required. RHINITIS /? 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 JElwyn ReachDr. Rowe-Rheu  Dr. RRiley NearingFoot specialist  Past Surgical History: Reviewed history from 02/24/2010 and no changes required. Knee Arthroscopy1998,1999,2002 Appendectomy ileocecal resection Jan 2011  Past History:  Care Management: Gastroenterology: JArdis HughsGynecology: RMancel BaleRheumatology:[place] Podiatry: UMarena Chancy  Family History: Reviewed history from 09/03/2008 and no changes required. Mother deceased secondary to brain aneurysm Father is age 9133and fairly healthy H8sister with diabetes and bipolar disorder Maternal grandmother with colon cancer Paternal grandfather with stomach cancer   Neg for thyroid disease   Social History: Reviewed history from 03/03/2010 and no changes required. 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 walking exercise.   Eats balanced  generally.   Review of Systems  The patient denies anorexia, fever, weight loss, vision loss, hoarseness, chest pain, syncope, prolonged cough, melena, hematochezia, severe indigestion/heartburn, difficulty walking, abnormal bleeding, and enlarged lymph nodes.         apparently has fibroid that needs  medication treatment delayed  from summer because of various illnesses and  schedule  Physical Exam  General:  Well-developed,well-nourished,in no acute distress;  alert,appropriate and cooperative throughout examination Head:  normocephalic and atraumatic.   Eyes:  eoms  no lid lag  slightly prominent    no  stare  Ears:  R ear normal and L ear normal.   Neck:  palp no nodule Lungs:  normal respiratory effort, no intercostal retractions, no accessory muscle use, and normal breath sounds.   Heart:  normal rate, regular rhythm, and no murmur.   Abdomen:  Bowel sounds positive,abdomen soft and non-tender without masses, organomegaly or hernias noted. Msk:  no joint warmth and no redness over joints.   Pulses:  pulses intact without delay   Neurologic:  non focal grossly   no tremor  Skin:  buttock area  right isded pilonidal swelling edema and redness very tender    does not travel to rectal area .  central dimple and no discharge.  Old scar well healed  Cervical Nodes:  No lymphadenopathy noted Psych:  Oriented X3, normally interactive, good eye contact, not anxious appearing, and not depressed appearing.     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  because of upcoming holiday in our office is closed we've arranged for her to be seen by a surgeon's office today for probable I&D.   Problem # 2:  THYROID FUNCTION TEST, ABNORMAL (ICD-794.5) tsh low    no symptoms of hyper thyroid   ? if sleep related .   Will recheck tsh with free t4 t3  when gets remiicade or here before next visit .  disc  poss of  endo rx etc.       prescription written.  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   to try and reassess at follow up  check up   Complete Medication List: 1)  Multivitamins Tabs (Multiple vitamin) .... Take one by  mouth once daily 2)  Tri-sprintec 0.18/0.215/0.25 Mg-35 Mcg Tabs (Norgestim-eth estrad triphasic) .... As directed 3)  Remicade 100 Mg Solr (Infliximab) .[name].. 69m/kg every 8 weeks 4)  Lunesta 3 Mg Tabs (Eszopiclone) ..[name]. 1 by mouth hs as needed for sleep  Patient Instructions: 1)  See surgeon today for your pilonidal abscess.for reasons we discussed. 2)  YOur labs  are good excep t  your thyroid test is off  3)  Will need  repeat TSH  free T4 and free T3   at next lab draw .   794.5  4)  Dx abnromal thyroid tests. 5)  try lunesta for sleep as needed as we disscussed  6)  Schedule  check  up and follow up   December  22 thursday at 8 15 am    Orders Added: 1)  Est. Patient Level IV [[[id number]]

## 2010-06-27 NOTE — Progress Notes (Signed)
Summary: ov  Phone Note Call from Patient Call back at Home Phone [phone]   Caller: vm Summary of Call: Was just there pneumonia.  Same symptoms again & want to come in tomorrow am.   Initial call taken by: Shelbie Hutching, RN,  March 27, 2010 4:39 PM  Follow-up for Phone Call        Spoke to pt- Coughing, congestion, some SOB, feels feverish but unable to take temp. Pt is also having some wheezing.  Follow-up by: Sherron Monday, CMA Deborra Medina),  March 27, 2010 4:42 PM  Additional Follow-up for Phone Call Additional follow up Details #1::        Per Dr. Regis Bill- get a cxr, check temp tonight and can work in tomorrow. Pt aware and order put in for CXR. Appt made. Additional Follow-up by: Sherron Monday, CMA Deborra Medina),  March 27, 2010 4:54 PM

## 2010-06-27 NOTE — Assessment & Plan Note (Signed)
Summary: fu per doc panosh ?uri/njr   Vital Signs:  Patient profile:   30 year old female Menstrual status:  regular Height:      62 inches (157.48 cm) Weight:      129.31 pounds (58.78 kg) O2 Sat:      99 % on Room air Temp:     98.3 degrees F (36.83 degrees C) oral Pulse rate:   107 / minute BP sitting:   110 / 68  (left arm) Cuff size:   regular  Vitals Entered By: Gardenia Phlegm RMA (February 25, 2010 9:21 AM)  O2 Flow:  Room air CC: CT yesterday showed pneumonia- pt stated she started antibiotics (Levaquin) yesterday/ CF Is Patient Diabetic? No   History of Present Illness: Patient in today for reevaluation of a left sided pneumonia. she was seen yesterday with chills cough malaise, congestion. Was started on Levaquin. Since starting the Levaquin the chills have resolved. She is still having a mild intermittent cough. Fatigue is noted but no other concerning symptoms mild nasal congestion and some postnasal drips occurring. When she lies down between the postnasal drip and irritated cough he has trouble breathing slightly when she sits up the results. She denies chest pain, palpitations, shortness of breath or wheezing. No GI or GU c/o.  Current Medications (verified): 1)  Multivitamins   Tabs (Multiple Vitamin) .... Take One By Mouth Once Daily 2)  Tri-Sprintec 0.18/0.215/0.25 Mg-35 Mcg Tabs (Norgestim-Eth Estrad Triphasic) .... As Directed 3)  Remicade 100 Mg Solr (Infliximab) .[name].. 38m/kg Every 8 Weeks 4)  Levaquin 750 Mg Tabs (Levofloxacin) ..[name]. 1 By Mouth Once Daily  Allergies (verified): No Known Drug Allergies  Past History:  Past medical history reviewed for relevance to current acute and chronic problems. Social history (including risk factors) reviewed for relevance to current acute and chronic problems.  Past Medical History: Reviewed history from 02/24/2010 and no changes required. RHINITIS /? 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 JElwyn ReachDr. Rowe-Rheu  Dr. RVickey Hugerspecialist  Social History: Reviewed history from 09/03/2008 and no changes required. 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   Eats balanced   generally.   Review of Systems      See HPI  Physical Exam  General:  Well-developed,well-nourished,in no acute distress; alert,appropriate and cooperative throughout examination Head:  Normocephalic and atraumatic without obvious abnormalities. No apparent alopecia or balding. Ears:  External ear exam shows no significant lesions or deformities.  Otoscopic examination reveals clear canals, tympanic membranes are intact bilaterally without bulging, retraction, inflammation or discharge. Hearing is grossly normal bilaterally. Cerumen noted but not obstructed Mouth:  Oral mucosa and oropharynx without lesions or exudates.  Teeth in good repair. 2-3 + tonsils b/l. Neck:  No deformities, masses, or tenderness noted. Lungs:  Normal respiratory effort, chest expands symmetrically. Lungs are clear to auscultation, no crackles or wheezes. Heart:  Normal rate and regular rhythm. S1 and S2 normal without gallop, murmur, click, rub or other extra sounds. Abdomen:  Bowel sounds positive,abdomen soft and non-tender without masses, organomegaly or hernias noted. Extremities:  No clubbing, cyanosis, edema, or deformity  noted  Cervical Nodes:  R anterior LN tender and L anterior LN tender.   Psych:  Cognition and judgment appear intact. Alert and cooperative with normal attention span and concentration. No  apparent delusions, illusions, hallucinations   Impression & Recommendations:  Problem # 1:  PNEUMONIA, LEFT UPPER LOBE (ICD-486)  Her updated medication list for this problem includes:    Levaquin 750 Mg Tabs (Levofloxacin) .[name]... 1 by mouth once daily Doing better today, will keep out of work on 10/03 and she will notify us if she is unready to go back  Problem # 2:  Young (ICD-555.2) Encouraged daily probiotic while on antibiotics  Complete Medication List: 1)  Multivitamins Tabs (Multiple vitamin) .... Take one by mouth once daily 2)  Tri-sprintec 0.18/0.215/0.25 Mg-35 Mcg Tabs (Norgestim-eth estrad triphasic) .... As directed 3)  Remicade 100 Mg Solr (Infliximab) .[name].. 44m/kg every 8 weeks 4)  Levaquin 750 Mg Tabs (Levofloxacin) ..[name]. 1 by mouth once daily  Patient Instructions: 1)  Take your antibiotic as prescribed until ALL of it is gone, but stop if you develop a rash or swelling and contact our office as soon as possible.  2)  AcutePneumonia symptoms for less then 10 days are not  helped by antibiotics. Take over the counter cough medications. Call if no improvement in 5-7 days, sooner if increasing cough, fever, or new symptoms ( shortness of breath, chest pain) .  3)  Recommend a probiotic daily during the month you take an antibiotic, yogurt or Align caps

## 2010-06-27 NOTE — Progress Notes (Signed)
Summary: walk in   Phone Note Call from Patient Call back at [facility], [place] Phone [phone]   Caller: Patient Summary of Call: Pt walked in from GI- chills, slight fever. No abd. pain. Breathing feels heavy. Some coughing that started last night. This started today temp 99.6 pulse ox 98% room air p 100 bp 110/70 Meds up to date NKDA last infusion was 9/29 Initial call taken by: Sherron Monday, [place] ([place]),  February 24, 2010 12:19 PM  Follow-up for Phone Call        Pt seen Follow-up by: Sherron Monday, CMA (AAMA),  February 24, 2010 1:00 PM

## 2010-06-27 NOTE — Assessment & Plan Note (Signed)
Summary: Not any better/ssc   Vital Signs:  Patient profile:   30 year old female Menstrual status:  regular LMP:     03/06/2010 Weight:      184 pounds O2 Sat:      98 % on Room air Temp:     98.6 degrees F oral Pulse rate:   87 / minute BP sitting:   110 / 70  (left arm) Cuff size:   regular  Vitals Entered By: Sherron Monday, CMA (AAMA) (March 28, 2010 10:56 AM)  O2 Flow:  Room air CC: Still having dry coughing and extreme drainage. Sore throat this am but is feeling better now. LMP (date): 03/06/2010 LMP - Character: normal Menarche (age onset years): 13   Menses interval (days): 28-30 Menstrual flow (days): 7-9 Enter LMP: 03/06/2010   History of Present Illness: [name]  comes in with above concerns .    She had  onset with sore throat 3 days ago and took tylenol  cold and st went away and then got cough and congestion and end of work yestserday felt very tired and chilled    last pm.   and now some better today.   Cpncerned as this was similar to the symptoms she had with the penumonia.  See last visit. She has more of a st with this.    Exposed to school populations. No specific exposures. it hurts to swallow on the right and has some tender LN on that side .    Preventive Screening-Counseling & Management  Alcohol-Tobacco     Alcohol drinks/day: 0     Smoking Status: never  Caffeine-Diet-Exercise     Caffeine use/day: 0     Does Patient Exercise: yes     Type of exercise: walking and aerobics     Exercise (avg: min/session): >60     Times/week: 2  Current Medications (verified): 1)  Multivitamins   Tabs (Multiple Vitamin) .... Take One By Mouth Once Daily 2)  Tri-Sprintec 0.18/0.215/0.25 Mg-35 Mcg Tabs (Norgestim-Eth Estrad Triphasic) .... As Directed 3)  Remicade 100 Mg Solr (Infliximab) .[name].. 22m/kg Every 8 Weeks  Allergies (verified): No Known Drug Allergies  Past History:  Past medical, surgical, family and social histories (including  risk factors) reviewed, and no changes noted (except as noted below).  Past Medical History: RHINITIS /? 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 JElwyn ReachDr. Rowe-Rheu  Dr. RRiley NearingFoot specialist  Past Surgical History: Reviewed history from 02/24/2010 and no changes required. Knee Arthroscopy1998,1999,2002 Appendectomy ileocecal resection Jan 2011  Family History: Reviewed history from 09/03/2008 and no changes required. Mother deceased secondary to brain aneurysm Father is age 5724and fairly healthy H18sister with diabetes and bipolar disorder Maternal grandmother with colon cancer Paternal grandfather with stomach cancer   Social History: Reviewed history from 03/03/2010 and no changes required. 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 walking exercise.   Eats balanced   generally.   Review of Systems       The patient complains of anorexia and  enlarged lymph nodes.  The patient denies fever, weight gain, dyspnea on exertion, prolonged cough, hemoptysis, abdominal pain, transient blindness, difficulty walking, and angioedema.    Physical Exam  General:  alert, well-developed, and well-nourished.  midly congested  non toxic appearing. Head:  normocephalic and atraumatic.   Eyes:  PERRL, EOMs full, conjunctiva clear  Ears:  R ear normal, L ear normal, and no external deformities.   Nose:  no external deformity and no external erythema.  some congestion Mouth:  op right tonsil 1-2+ left 1+ right  slightly red with early bexudate Neck:  Mildly tender rightac node 1+  no pc or others  Lungs:  Normal respiratory effort, chest expands symmetrically. Lungs are clear to auscultation, no crackles or wheezes.no dullness.   Heart:  Normal rate and regular rhythm. S1 and S2 normal without gallop, murmur, click, rub or other extra sounds. Abdomen:  Bowel sounds positive,abdomen soft and non-tender without masses, organomegaly or  noted. Skin:  turgor normal, color normal, no ecchymoses, and no petechiae.   Cervical Nodes:  no posterior cervical adenopathy.  see neck exam  Psych:  Oriented X3, good eye contact, and not anxious appearing.   cxray is clear   Impression & Recommendations:  Problem # 1:  URI (ICD-465.9) acts viral and no signs of pneumonia .    Expectant management    Problem # 2:  ACUTE TONSILLITIS (ICD-463)   ?  she does have hypertrophy but now with  right midl adenopathy reactive  and early exudate   above illness seems viral but at risk with tonsillits  being bacterial cause of assymmetry  low threshhold to  add antibioitc because of this.   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 Take one by mouth once daily 2)  Tri-sprintec 0.18/0.215/0.25 Mg-35 Mcg Tabs (Norgestim-eth estrad triphasic) .... As directed 3)  Remicade 100 Mg Solr (Infliximab) .[name].. 80m/kg every 8 weeks 4)  Cephalexin 500 Mg Caps (Cephalexin) ..[name]. 1 by mouth two times a day for  right sided  tonsillitis  Patient Instructions: 1)  No pneumonia noted  and the coughing is probably from a viral chest cold that should get better on its own.  2)  However you have a right sided tonsilitis   and  would add antibioitc for this if not better in the next day.  3)  Call if fever or throat  getting  worse.   Prescriptions: CEPHALEXIN 500 MG CAPS (CEPHALEXIN) 1 by mouth  two times a day for  right sided  tonsillitis  #20 x 0   Entered and Authorized by:   WBurnis MedinMD   Signed by:   WBurnis MedinMD on 03/28/2010   Method used:   Electronically to        [facility] #[phone] (retail)       [address]      [place]  [address]      Ph: [phone] [phone]      Fax: [phone]  RxID:   [id number]   Orders Added: 1)  Est. Patient Level IV [[56433]

## 2010-06-27 NOTE — Letter (Signed)
Summary: [facility]   Imported By: Phillis Knack 09/22/2009 13:56:28  _____________________________________________________________________  External Attachment:    Type:   Image     Comment:   External Document

## 2010-06-27 NOTE — Letter (Signed)
Summary: Appointment Reminder  [facility]  [place], [place] [address]   Phone: [phone]  Fax: [phone]        August 30, 2009 MRN: [id number]    [address]  [place], [place]  [address]    Dear Ms. DAVIS,   We have been unable to reach you by phone to schedule a TB skin test  appointment that was recommended for you by Dr. Ardis Hughs.  It is very  important that we reach you to schedule an appointment. We hope that you  allow [name] to participate in your health care needs. Please contact us at  [phone] at your earliest convenience to schedule your appointment.     Sincerely,    Christian Mate CMA ([place])  Appended Document: Appointment Reminder letter mailed

## 2010-06-27 NOTE — Miscellaneous (Signed)
Summary: Med. Update  Clinical Lists Changes  Medications: Added new medication of AUGMENTIN 875-125 MG TABS (AMOXICILLIN-POT CLAVULANATE) Take one by mouth 2 times a day for 3 weeks. - Signed Added new medication of PROMETHAZINE HCL 25 MG TABS (PROMETHAZINE HCL) Take 1/2 to 1 tablet by mouth every 4-6 hours as needed for nausea/vomiting. - Signed Rx of AUGMENTIN 875-125 MG TABS (AMOXICILLIN-POT CLAVULANATE) Take one by mouth 2 times a day for 3 weeks.;  #42 x 0;  Signed;  Entered by: Vivia Ewing LPN;  Authorized by: Milus Banister MD;  Method used: Electronically to [facility] [phone]*, [address], [place], [place], [place]  [address], Ph: [phone] or [phone], Fax: [phone] Rx of PROMETHAZINE HCL 25 MG TABS (PROMETHAZINE HCL) Take 1/2 to 1 tablet by mouth every 4-6 hours as needed for nausea/vomiting.;  #30 x 0;  Signed;  Entered by: Vivia Ewing LPN;  Authorized by: Milus Banister MD;  Method used: Electronically to [facility] [phone]*, [address], [place], [place], [place]  [address], Ph: [phone] or [phone], Fax: [phone]    Prescriptions: PROMETHAZINE HCL 25 MG TABS (PROMETHAZINE HCL) Take 1/2 to 1 tablet by mouth every 4-6 hours as needed for nausea/vomiting.  #30 x 0   Entered by:   Vivia Ewing LPN   Authorized by:   Milus Banister MD   Signed by:   Vivia Ewing LPN on 17/61/6073   Method used:   Electronically to        [facility]. [phone]* (retail)       [address]       [place], [place]  [address]       Ph: [phone] or [phone]       Fax: [phone]   RxID:   [id number] AUGMENTIN 875-125 MG TABS (AMOXICILLIN-POT CLAVULANATE) Take one by mouth 2 times a day for 3 weeks.  #42 x 0   Entered by:   Vivia Ewing LPN   Authorized by:   Milus Banister MD   Signed by:   Vivia Ewing LPN on 35/36/1443   Method used:   Electronically to        [facility]. [phone]* (retail)       [address]       [place], [place]  [address]       Ph: [phone] or [phone]       Fax: [phone]   RxID:   [id number]

## 2010-06-27 NOTE — Assessment & Plan Note (Signed)
Review of gastrointestinal problems: 1. Crohn ileitis.  Presented like an acute appendicitis 2006.  Surgery, not in [place], was much more suggestive of Crohn's disease.  She did not respond to immunomodulators and so eventually she Began Remicade April 2007 While continuing on azathioprine.  Early 2008, stopped azathioprine and she tolerated solo maintenance Remicade (5 mg per kilogram) well. April 2009 continues to tolerate Remicade infusions very well, getting her infusions at Dr. Tanna Furry office.   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  Her Remicade dosing was increased to 10 mg per kilogram.  Winter, 2010 presented with right lower quadrant abscess, clear fistula connection between small bowel and the abscess. 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 Infection was drained with interventional radiology, drain eventually pulled however abscess recurred within days and so right lower quadrant drain was replaced.  January, 2011: she is on azathioprine 100 mg a day, Augmentin twice daily, planning for surgery late January. January, 2011: ileocecectomy performed for active disease, persistent fistula, abscess  History of Present Illness Visit Type: Follow-up Visit Primary GI MD: Owens Loffler MD Primary Provider: [name], MD Requesting Provider: n/a Chief Complaint: 2 month follow-up History of Present Illness:     has felt "fine" since last visit.  On 43m/kg every 8 weeks.  Most recent remicade was just last week.  Had labs drawn.  she has had no significant abdominal pains, no diarrhea. She has mild incisional tenderness that does not really bother her.           Current Medications (verified): 1)  Multivitamins   Tabs (Multiple Vitamin) .... Take One By Mouth Once Daily 2)  Loestrin 24 Fe 1-20 Mg-Mcg Tabs (Norethin Ace-Eth Estrad-Fe) ..[name]. 1 By  Mouth Once Daily 3)  Remicade 100 Mg Solr (Infliximab) ..[name]. 131mkg Every 8 Weeks  Allergies (verified): No Known Drug Allergies  Vital Signs:  Patient profile:   [age]ear old female Menstrual status:  regular Height:      62 inches Weight:      162 pounds BMI:     29.74 Pulse rate:   68 / minute Pulse rhythm:   regular BP sitting:   108 / 68  (left arm)  Vitals Entered By: BaRandye LoboCPriceApril 15, 2011 9:40 AM)  Physical Exam  Additional Exam:  Constitutional: generally well appearing Psychiatric: alert and oriented times 3 Abdomen: soft, non-tender, non-distended, normal bowel sounds    Impression & Recommendations:  Problem # 1:  Crohn's disease she is doing well on Remicade 10 mg per kilogram every 8 weeks. She will stay on this regimen. We will get recent labs that were drawn her last week sent over for our review. She will return to see me in 4 months and sooner if needed.  Patient Instructions: 1)  Continue remicade 10/mg/kg every 8 weeks. 2)  Return to see Dr. JaArdis Hughsn 4 months. 3)  We will recent labs sent over from Dr. RoTanna Furryffice. 4)  Call if any troubles. 5)  The medication list was reviewed and reconciled.  All changed / newly prescribed medications were explained.  A complete medication list was provided to the patient / caregiver.   Immunizations Administered:  PPD Skin Test:    Vaccine Type: PPD    Site: right forearm    Mfr: Sanofi Pasteur    Dose: 0.1 ml  Route: ID    Given by: Christian Mate CMA ([place])    Exp. Date: 10/23/2011    Lot #: [id number]  Appended Document: TB skin test read    Clinical Lists Changes  Observations: Added new observation of TB PPDRESULT: negative (09/12/2009 16:56) Added new observation of PPD RESULT: < 63m (09/12/2009 16:56) Added new observation of TB-PPD RDDTE: 09/12/2009 (09/12/2009 16:56)         PPD Results    Date of reading: 09/12/2009    Results: < 561m   Interpretation: negative

## 2010-06-27 NOTE — Assessment & Plan Note (Signed)
Review of gastrointestinal problems: 1. Crohn ileitis.  Presented like an acute appendicitis 2006.  Surgery, not in [place], was much more suggestive of Crohn's disease.  She did not respond to immunomodulators and so eventually she Began Remicade April 2007 While continuing on azathioprine.  Early 2008, stopped azathioprine and she tolerated solo maintenance Remicade (5 mg per kilogram) well. April 2009 continues to tolerate Remicade infusions very well, getting her infusions at Dr. Tanna Furry office.   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  Her Remicade dosing was increased to 10 mg per kilogram.  Winter, 2010 presented with right lower quadrant abscess, clear fistula connection between small bowel and the abscess. 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 Infection was drained with interventional radiology, drain eventually pulled however abscess recurred within days and so right lower quadrant drain was replaced.  January, 2011: she is on azathioprine 100 mg a day, Augmentin twice daily, planning for surgery late January.  History of Present Illness Visit Type: follow up  Primary GI MD: Owens Loffler MD Primary Provider: [name], MD Requesting Provider: n/a Chief Complaint: Post hosp f/u  History of Present Illness:     [name] was discharged from the hospital about a week ago. She is maintaining her right lower quadrant drain by flushing it twice a day. She has had no fevers or chills. Her right lower quadrant discomfort is much much improved. She is taking Augmentin twice daily. She is planning to have surgery with Dr. Zenia Resides in about 2 weeks' time. She will stay on antibiotics and continued drain care until then.           Current Medications (verified): 1)  Multivitamins   Tabs (Multiple Vitamin) .... Take One By Mouth Once Daily 2)  Loestrin 24  Fe 1-20 Mg-Mcg Tabs (Norethin Ace-Eth Estrad-Fe) .[name].. 1 By Mouth Once Daily 3)  Azathioprine 50 Mg Tabs (Azathioprine) .... Take 1 Tab Two Times A Day 4)  Augmentin 875-125 Mg Tabs (Amoxicillin-Pot Clavulanate) .... Take One By Mouth 2 Times A Day For 3 Weeks. 5)  Promethazine Hcl 25 Mg Tabs (Promethazine Hcl) .... Take 1/2 To 1 Tablet By Mouth Every 4-6 Hours As Needed For Nausea/vomiting. 6)  Ensure  Liqd (Nutritional Supplements) .... Three Times A Day  Allergies (verified): No Known Drug Allergies  Vital Signs:  Patient profile:   30 year old female Menstrual status:  regular Height:      62 inches Weight:      157 pounds BMI:     28.82 BSA:     1.73 Pulse rate:   112 / minute Pulse rhythm:   regular BP sitting:   98 / 62  (left arm) Cuff size:   regular  Vitals Entered By: Hope Pigeon CMA (June 07, 2009 10:27 AM)  Physical Exam  Additional Exam:  Constitutional: generally well appearing Psychiatric: alert and oriented times 3 Abdomen: soft,right lower quadrant drain with appropriate mild tenderness at the site. No suggestion of deep tissue infection., non-distended, normal bowel sounds    Impression & Recommendations:  Problem # 1:  Crohn's disease, right lower quadrant abscess,fistulas she will continue on antibiotics twice daily and we'll maintain care of the drain until the time of her surgery in about 2 weeks' time. She will continue taking azathioprine 100 mg a day until the time of surgery and will resume this medicine as  soon as she is reliably taking oral intake following surgery. She will return to my office in mid February which should be about 2 weeks after her surgery. She knows to get touch sooner if there are any issues in the surgeon's know how to contact the [facility] team during her hospital stay if that is required.  Patient Instructions: 1)  Stay on your augmentin twice daily. 2)  Continue drain care. 3)  Will plan that you restart the azathiaprine as  soon as you are eating, drinking reliably after the surgery. 4)  Return to see Dr. Ardis Hughs 2 weeks after surgery [facility] February). 5)  A copy of this information will be sent to Drs. [name], [employer]. 6)  The medication list was reviewed and reconciled.  All changed / newly prescribed medications were explained.  A complete medication list was provided to the patient / caregiver.

## 2010-06-27 NOTE — Progress Notes (Signed)
Summary: follow up request   Phone Note Call from Patient Call back at Home Phone [phone]   Caller: Patient Call For: Dr. Ardis Hughs Reason for Call: Talk to Nurse Summary of Call: pt walked in and asked to be seen right now... not feeling well after taking Remicade and wants to know if it is a drug reaction or Crohn's... Dr. Ardis Hughs is hospital doctor this week and , told pt that she could see her pcp and that I would have Patty call and touch base with her Initial call taken by: Lucien Mons,  February 24, 2010 11:44 AM  Follow-up for Phone Call        I spoke with  the pt and she is seeing Dr Regis Bill now, a chest xray has been ordered.  Dr Regis Bill thinks she has a URI.  I advised the pt to call back if Dr Regis Bill feels she needs to see a GI.  She agreed. Follow-up by: Christian Mate CMA Deborra Medina),  February 24, 2010 12:55 PM

## 2010-06-27 NOTE — Letter (Signed)
Summary: [facility]   Imported By: Phillis Knack 11/24/2009 08:12:01  _____________________________________________________________________  External Attachment:    Type:   Image     Comment:   External Document

## 2010-06-27 NOTE — Assessment & Plan Note (Signed)
Review of gastrointestinal problems: 1. Crohn ileitis.  Presented like an acute appendicitis 2006.  Surgery, not in [place], was much more suggestive of Crohn's disease.  She did not respond to immunomodulators and so eventually she Began Remicade April 2007 While continuing on azathioprine.  Early 2008, stopped azathioprine and she tolerated solo maintenance Remicade (5 mg per kilogram) well. April 2009 continues to tolerate Remicade infusions very well, getting her infusions at Dr. Tanna Furry office.   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  Her Remicade dosing was increased to 10 mg per kilogram.  Winter, 2010 presented with right lower quadrant abscess, clear fistula connection between small bowel and the abscess. 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 Infection was drained with interventional radiology, drain eventually pulled however abscess recurred within days and so right lower quadrant drain was replaced.  January, 2011: she is on azathioprine 100 mg a day, Augmentin twice daily, planning for surgery late January. January, 2011: ileocecectomy performed for active disease, persistent fistula, abscess 2. May, 2011: polynidol cyst drained by urgent care    History of Present Illness Visit Type: Follow-up Visit Primary GI MD: Owens Loffler MD Primary Provider: [name], MD Requesting Provider: n/a Chief Complaint: Colitis History of Present Illness:     very pleasant 30 year old woman whom I last saw 6 weeks ago.  she went home for mothers day, had pain in left buttocks.  Finally went to urgent care, incision/drainage, packing and salt baths.  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  Normal solid stools, non-bloody twice a day.  No abdominal pains.  Feels well. Last remicade 2 months ago, next is in one week.           Current Medications  (verified): 1)  Multivitamins   Tabs (Multiple Vitamin) .... Take One By Mouth Once Daily 2)  Tri-Sprintec 0.18/0.215/0.25 Mg-35 Mcg Tabs (Norgestim-Eth Estrad Triphasic) .... As Directed 3)  Remicade 100 Mg Solr (Infliximab) .[name].. 21m/kg Every 8 Weeks  Allergies (verified): No Known Drug Allergies  Vital Signs:  Patient profile:   30year old female Menstrual status:  regular Height:      62 inches Weight:      169.25 pounds BMI:     31.07 Pulse rate:   84 / minute Pulse rhythm:   regular BP sitting:   106 / 60  (left arm) Cuff size:   regular  Vitals Entered By: June McMurray CSummit(Deborra Medina (October 26, 2009 3:49 PM)  Physical Exam  Additional Exam:  Constitutional: generally well appearing Psychiatric: alert and oriented times 3 Abdomen: soft, non-tender, non-distended, normal bowel sounds Buttocks examination with female assistant in room: no fluctuance, slight linear scar at site of midline previous polynidol cyst. No sign of abscess.   Impression & Recommendations:  Problem # 1:  Crohn's disease symptoms under good control on an every 8 week 10 mg per kilogram Remicade. She will return to see me in 3 months.  Problem # 2:  recent poly-nidal cyst we will get records over from urgent care but it does sounds like that is what she had. Her symptoms are completely gone the site looks clear. She knows to call myself or her surgeon Dr. ARonnald Collumif symptoms return.  Patient Instructions: 1)  [facility] 2)  Return to see Dr. JArdis Hughsin 3 months.  3)  If symptoms return, call Dr. Zenia Resides or Dr. Ardis Hughs. 4)  The medication list was reviewed and reconciled.  All changed / newly prescribed medications were explained.  A complete medication list was provided to the patient / caregiver.

## 2010-06-27 NOTE — Progress Notes (Signed)
Summary: TB skin test reminder   Phone Note Outgoing Call Call back at [facility] Phone [phone]   Call placed by: Christian Mate CMA Deborra Medina),  August 26, 2009 1:42 PM Summary of Call: left message on machine to call back regarding annual TB skin test Initial call taken by: Christian Mate CMA Deborra Medina),  August 26, 2009 1:42 PM  Follow-up for Phone Call        left message on machine to call back [place] Deborra Medina)  August 29, 2009 8:37 AM   left message on machine to call back [place] Deborra Medina)  August 30, 2009 8:55 AM   letter mailed Follow-up by: Christian Mate CMA Deborra Medina),  August 30, 2009 8:55 AM

## 2010-06-27 NOTE — Letter (Signed)
Summary: Office Visit Letter  [facility]  [address] [place], [place] [address]   Phone: [phone]  Fax: [phone]      November 15, 2009 MRN: [id number]   [name]  [place], [place]  [address]   Dear Ms. DAVIS,   According to our records, it is time for you to schedule a follow-up office visit with [name] in the month of August 2011.   At your convenience, please call [phone] (option #2)to schedule an office visit. If you have any questions, concerns, or feel that this letter is in error, we would appreciate your call.   Sincerely,   Zannie Kehr, M.D.  [facility] [phone]

## 2010-06-27 NOTE — Assessment & Plan Note (Signed)
Summary: CHRONS FLARE UP OR POSSIBLE VIRUS//ALP   Vital Signs:  Patient profile:   30 year old female Menstrual status:  regular Height:      62 inches Weight:      169.25 pounds O2 Sat:      98 % on Room air Temp:     99.6 degrees F oral Pulse rate:   100 / minute BP sitting:   110 / 70  (left arm) Cuff size:   regular  Vitals Entered By: Sherron Monday, CMA (AAMA) (February 24, 2010 12:29 PM)  O2 Flow:  Room air CC: chills, slight fever. No abd. pain. Breathing feels heavy. Some coughing that started last night.   History of Present Illness: [name] comes in today  as a walk in  in patient  acute  ... see  triage note and GI note.   Had remicade infusion yesterday at [facility] for her crohns disease and was tole her blood tests were normal . however she develoed nausea chilles and sob and mild dry cough in  last nigh this am and  called Gi  was told to see pcp so walked in to clinic today .  No Vomiting diarrhea or bleeding .  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 slightly runny nose this am  and sob  no wheeze hemoptysis or sweats or inc LN    Had flare of crohns and abscess and surgery ileocolon resection   January 2011   has been stable in the past few months  . Last PPD was in March and negative .  Preventive Screening-Counseling & Management  Alcohol-Tobacco     Alcohol drinks/day: 0     Smoking Status: never  Caffeine-Diet-Exercise     Caffeine use/day: 0     Does Patient Exercise: yes     Type of exercise: walking and aerobics     Exercise (avg: min/session): >60     Times/week: 2  Current Medications (verified): 1)  Multivitamins   Tabs (Multiple Vitamin) .... Take One By Mouth Once Daily 2)  Tri-Sprintec 0.18/0.215/0.25 Mg-35 Mcg Tabs (Norgestim-Eth Estrad Triphasic) .... As Directed 3)  Remicade 100 Mg Solr (Infliximab) .[name].. 39m/kg Every 8 Weeks  Allergies (verified): No Known Drug Allergies  Past History:  Past medical, surgical, family  and social histories (including risk factors) reviewed, and no changes noted (except as noted below).  Past Medical History: RHINITIS /? 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 JElwyn ReachDr. Rowe-Rheu  Dr. RRiley NearingFoot specialist  Past Surgical History: Knee Arthroscopy1998,1999,2002 Appendectomy ileocecal resection Jan 2011  Past History:  Care Management: Gastroenterology: JArdis HughsGynecology: RMancel BaleRheumatology:[place] Podiatry: UMarena Chancy  Family History: Reviewed history from 09/03/2008 and no changes required. Mother deceased secondary to brain aneurysm Father is age 5047and fairly healthy H56sister with diabetes and bipolar disorder Maternal grandmother with colon cancer Paternal grandfather with stomach cancer   Social History: Reviewed history from 09/03/2008 and no changes required. 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   Eats balanced   generally.   Review of Systems  The patient denies vision loss, decreased hearing,  hoarseness, chest pain, syncope, peripheral edema, prolonged cough, abdominal pain, melena, hematochezia, hematuria, muscle weakness, transient blindness, difficulty walking, unusual weight change, abnormal bleeding, enlarged lymph nodes, and angioedema.         malaise fatigue chills no vomiting or diarrhea or urine uti signs    no leg pain or swelling    rest of  12 system reivew  neg or as   per hpi   Physical Exam  General:  mildly ill in nad  slight dypnea resolve with talking and  exam  nasally congested  Head:   Normocephalic and atraumatic without obvious abnormalities. No apparent alopecia or balding. Eyes:  clear  eoms nl no injection.   Ears:  R ear normal, L ear normal, and no external deformities.   Nose:  no external deformity and no external erythema.  congested no face pain  Mouth:  tonsils 1 + no redness or exudate    teeeth in ok repair  Neck:  soddy ac tender nodes no pc nodes  Lungs:  Normal respiratory effort, chest expands symmetrically. Lungs are clear to auscultation, no crackles or wheezes.no dullness.   ? if decrease bs   at bases dry irritative cough noted  Heart:  Normal rate and regular rhythm. S1 and S2 normal without gallop, murmur, click, rub or other extra sounds. Abdomen:  soft, non-tender, normal bowel sounds, no distention, no masses, no guarding, no hepatomegaly, and no splenomegaly.   Msk:  no joint swelling, no joint warmth, and no redness over joints.   Pulses:  nl cap perfusion intact  Extremities:  no clubbing cyanosis or edema  Neurologic:  non focal   alert & oriented X3, strength normal in all extremities, and gait normal.   nl balance Skin:  turgor normal, color normal, no ecchymoses, and no petechiae.   Cervical Nodes:  no posterior cervical adenopathy.  shoddy ac nodes  Psych:  Oriented X3, good eye contact, not anxious appearing, and not depressed appearing.     Impression & Recommendations:  Problem # 1:  COUGH (XFG-182.2) probably  rti    high risk situation for infecxtionr/o pneumonia other process with hx of remicade   and dyspnea. 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 Take one by mouth once daily 2)  Tri-sprintec 0.18/0.215/0.25 Mg-35 Mcg Tabs (Norgestim-eth estrad triphasic) .... As directed 3)  Remicade 100 Mg Solr (Infliximab) .[name].. 78m/kg every 8 weeks 4)  Levaquin 750 Mg Tabs (Levofloxacin) ..[name]. 1 by mouth once daily  Patient Instructions: 1)  get the chest x ray and will call you with  results and plan 2)  this seems like a respiratory infection  poss viral but  concern about bacterial infection also. 3)  Make appt saturday clinic for tomorrow   [facility]ed [facility] closed but called DR KMaudie Mercuryon call who was able to look up copy  of her blood tests and her wbc was  8.2  and nl diff .  Yesterday.   results will be faxed to [name].   See x ray report   probable  LUL pneumonia  and rx with levaquin and seek emergent care if  worse . otherwise keep sat am appt.

## 2010-06-27 NOTE — Progress Notes (Signed)
Summary: cough up a little blood  Phone Note Call from Patient Call back at Home Phone [phone]   Caller: Patient Call For: Burnis Medin MD Summary of Call: pt was seen friday 02-24-2010.  pt cough up a little blood today should she be concern? Initial call taken by: Glo Herring,  February 27, 2010 9:58 AM  Follow-up for Phone Call        have triage or clinical person call for more information  and ask when her follow up visit is. Follow-up by: Burnis Medin MD,  February 27, 2010 1:15 PM  Additional Follow-up for Phone Call Additional follow up Details #1::        Spoke to pt and it was a small amount of blood. Pt states that she was coughing up congestion when she saw some blood. No sob, wheezing, dizziness or passing out. PT states that it just happened the one time. Additional Follow-up by: Sherron Monday, CMA (AAMA),  February 27, 2010 1:32 PM    Additional Follow-up for Phone Call Additional follow up Details #2::    if fever still gone ok to wait until follow up  on friday .  if keeps happening .  call back Follow-up by: Burnis Medin MD,  February 27, 2010 4:55 PM  Additional Follow-up for Phone Call Additional follow up Details #3:: Details for Additional Follow-up Action Taken: Pt aware and is not having fever. She will call us back if she keeps this up. Additional Follow-up by: Sherron Monday, CMA (AAMA),  February 27, 2010 4:57 PM

## 2010-06-27 NOTE — Assessment & Plan Note (Signed)
Review of gastrointestinal problems: 1. Crohn ileitis.  Presented like an acute appendicitis 2006.  Surgery, not in [place], was much more suggestive of Crohn's disease.  She did not respond to immunomodulators and so eventually she Began Remicade April 2007 While continuing on azathioprine.  Early 2008, stopped azathioprine and she tolerated solo maintenance Remicade (5 mg per kilogram) well. April 2009 continues to tolerate Remicade infusions very well, getting her infusions at Dr. [name] office.   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  Her Remicade dosing was increased to 10 mg per kilogram.  Winter, 2010 presented with right lower quadrant abscess, clear fistula connection between small bowel and the abscess. 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 Infection was drained with interventional radiology, drain eventually pulled however abscess recurred within days and so right lower quadrant drain was replaced.  January, 2011: she is on azathioprine 100 mg a day, Augmentin twice daily, planning for surgery late January. January, 2011: ileocecectomy performed for active disease, persistent fistula, abscess    History of Present Illness Visit Type: Follow-up Visit Primary GI MD: Donna Loffler MD Primary Provider: [name], MD Requesting Provider: n/a Chief Complaint: F/u visit  History of Present Illness:     very pleasant 30 year old woman whom I last saw about 6 weeks ago, prior to her ileocecectomy.  s/p ileocecectomy last month.  Did well overall, discharged after about one week.  Since leaving the hospital she is finally starting to feel back to normal.  She is getting more active, not needing to nap.           Current Medications (verified): 1)  Multivitamins   Tabs (Multiple Vitamin) .... Take One By Mouth Once Daily 2)  Loestrin 24 Fe 1-20  Mg-Mcg Tabs (Norethin [name]-Eth Estrad-Fe) .[name].. 1 By Mouth Once Daily 3)  Ensure  Liqd (Nutritional Supplements) .... Three Times A Day  Allergies (verified): No Known Drug Allergies  Vital Signs:  Patient profile:   30 year old female Menstrual status:  regular Height:      62 inches Weight:      155 pounds BMI:     28.45 BSA:     1.72 Pulse rate:   98 / minute Pulse rhythm:   regular BP sitting:   98 / 62  (left arm) Cuff size:   regular  Vitals Entered By: [name] (July 12, 2009 10:58 AM)  Physical Exam  Additional Exam:  Constitutional: generally well appearing Psychiatric: alert and oriented times 3 Abdomen: soft,very mildly tender around the surgical incision site which is healing very nicely in right lower quadrant, non-distended, normal bowel sounds    Impression & Recommendations:  Problem # 1:  CROHN'S DISEASE she is doing well post ileocecectomy for last month. She needs to be back on maintenance Crohn's therapy. I have recommended that we restart the Remicade at 10 mg per kilogram. My decision was based on the fact that while on 5 mg per kilogram she continued to have a Medicaid enough inflammation in her intestines that she developed a fistula and ultimately an abscess. I do not think we need to add immunomodulators at this point. Indeed she never really responded to immunomodulators in the first place. We will set this up for her to be done every 8 weeks and she'll return to see me in 2-3 months time.  Patient Instructions: 1)  Start back on remicade at 10 mg/kg dose, every 8 weeks. 2)  Return to see Dr. [name] in 2-3 months, sooner if needed. 3)  A copy of this information will be sent to Dr. [name]. 4)  The medication list was reviewed and reconciled.  All changed / newly prescribed medications were explained.  A complete medication list was provided to the patient / caregiver.  Appended Document: Orders Update/Remicade    Clinical Lists  Changes  Orders: Added new Test order of [place] - T. Rowe (remicade) ([facility]) - Signed

## 2010-06-29 NOTE — Letter (Signed)
Summary: [facility]   Imported By: Laural Benes 05/04/2010 11:31:33  _____________________________________________________________________  External Attachment:    Type:   Image     Comment:   External Document

## 2010-06-29 NOTE — Letter (Signed)
Summary: [facility]   Imported By: Phillis Knack 06/14/2010 11:52:43  _____________________________________________________________________  External Attachment:    Type:   Image     Comment:   External Document

## 2010-06-29 NOTE — Progress Notes (Signed)
Summary: FYI  Phone Note Call from Patient   Caller: Patient Call For: Donna Medin MD Summary of Call: Pt's last tetanus was 06/2003. Initial call taken by: [facility] CMA AAMA,  May 18, 2010 1:50 PM      Immunization History:  Tetanus/Td Immunization History:    Tetanus/Td:  historical (06/29/2003)

## 2010-06-29 NOTE — Assessment & Plan Note (Signed)
Summary: fup ok per dr Regis Bill to do//ccm   Vital Signs:  Patient profile:   30 year old female Menstrual status:  regular LMP:     05/04/2010 Weight:      192 pounds Pulse rate:   72 / minute BP sitting:   120 / 80  (left arm) Cuff size:   regular  Vitals Entered By: Sherron Monday, CMA Deborra Medina) (May 18, 2010 8:25 AM) CC: follow-up visit LMP (date): 05/04/2010 LMP - Character: normal Menarche (age onset years): 6   Menses interval (days): 28-30 Menstrual flow (days): 7-9 Enter LMP: 05/04/2010   History of Present Illness: [name]  comes in today  for preventive visit   Since last visit : Pilonidal abscess better  and felt no definitive procedure needed unless recurrent. Crohns  :may have surgery in summer for hernia Sleep: lunesta helps  but not taking all the time .   working on this and prefers no meds GYNE:  has fibroid and" poss to be rx with  medication  to help it shrink" Had thyroid labs redone NA yet.  Preventive Screening-Counseling & Management  Alcohol-Tobacco     Alcohol drinks/day: 0     Smoking Status: never  Caffeine-Diet-Exercise     Caffeine use/day: 0     Does Patient Exercise: yes     Type of exercise: walking and aerobics     Exercise (avg: min/session): >60     Times/week: 2  Hep-HIV-STD-Contraception     Dental Visit-last 6 months yes     Sun Exposure-Excessive: no  Safety-Violence-Falls     Seat Belt Use: yes     Firearms in the Home: no firearms in the home     Smoke Detectors: yes     Fall Risk: no      Drug Use:  no.        Blood Transfusions:  no.    Current Medications (verified): 1)  Multivitamins   Tabs (Multiple Vitamin) .... Take One By Mouth Once Daily 2)  Tri-Sprintec 0.18/0.215/0.25 Mg-35 Mcg Tabs (Norgestim-Eth Estrad Triphasic) .... As Directed 3)  Remicade 100 Mg Solr (Infliximab) .[name].. 70m/kg Every 8 Weeks 4)  Lunesta 3 Mg Tabs (Eszopiclone) ..[name]. 1 By Mouth Hs As Needed For Sleep  Allergies  (verified): No Known Drug Allergies  Past History:  Past medical, surgical, family and social histories (including risk factors) reviewed, and no changes noted (except as noted below).  Past Medical History: RHINITIS /? 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 JElwyn ReachDr. Rowe-Rheu  Dr. RRiley NearingFoot specialist  Past Surgical History: Reviewed history from 02/24/2010 and no changes required. Knee Arthroscopy1998,1999,2002 Appendectomy ileocecal resection Jan 2011  Past History:  Care Management: Gastroenterology: JArdis HughsGynecology: RMancel BaleRheumatology:[place] Podiatry: UMarena Chancy  Family History: Reviewed history from 04/18/2010 and no changes required. Mother deceased secondary to brain aneurysm Father is age 5540and fairly healthy H81sister with diabetes and bipolar disorder Maternal grandmother with colon cancer Paternal grandfather with stomach cancer   Neg for thyroid disease   Social History: Reviewed history from 03/03/2010 and no changes required. 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 walking exercise.   Eats balanced   generally. Seat Belt Use:  yes Dental Care w/in 6 mos.:  yes Fall Risk:  no Sun Exposure-Excessive:  no Blood Transfusions:  no  Review of Systems  The patient denies anorexia, fever, weight loss, weight gain, vision loss, decreased hearing, hoarseness, chest pain, syncope, dyspnea on exertion, peripheral edema, prolonged cough,  headaches, hemoptysis, abdominal pain, melena, hematochezia, severe indigestion/heartburn, hematuria, muscle weakness, difficulty walking, depression, abnormal bleeding, enlarged lymph nodes, angioedema, and breast masses.    Physical Exam  General:  Well-developed,well-nourished,in no acute distress; alert,appropriate and cooperative throughout examination Head:  normocephalic and atraumatic.   Eyes:  PERRL, EOMs full, conjunctiva clear  Ears:  R ear normal, L ear normal, and ear piercing(s) noted.   Nose:  no external deformity and no nasal discharge.   Mouth:  good dentition and pharynx pink and moist.  tonsillar hypertrophy Neck:  palp no nodule Breasts:  No mass, nodules, thickening, tenderness, bulging, retraction, inflamation, nipple discharge or skin changes noted.   Lungs:  Normal respiratory effort, chest expands symmetrically. Lungs are clear to auscultation, no crackles or wheezes.no dullness.   Heart:  Normal rate and regular rhythm. S1 and S2 normal without gallop, murmur, click, rub or other extra sounds.no lifts.   Abdomen:  fibroid lower abd soft, non-tender, no hepatomegaly, and no splenomegaly.  wellhealed  scars  Genitalia:  per gyne Msk:  no joint swelling, no joint warmth, and no redness over joints.   Pulses:  pulses intact without delay   Extremities:  no clubbing cyanosis or edema  Neurologic:  alert & oriented X3, strength normal in all extremities, gait normal, and DTRs symmetrical and normal.   Skin:  turgor normal and color normal.  some inc hair on chin  neck  midl acne face  Cervical Nodes:  No lymphadenopathy noted Axillary Nodes:  No palpable lymphadenopathy Inguinal Nodes:  No significant adenopathy Psych:  Normal eye contact, appropriate affect. Cognition appears normal.    Impression & Recommendations:  Problem # 1:  Preventive Health Care (ICD-V70.0) Discussed nutrition,exercise,diet,healthy weight, vitamin D and calcium. 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  Problem # 5:  Hx of FIBROIDS, UTERUS (ICD-218.9) following  may go on   hormonal rc for this   Complete Medication List: 1)  Multivitamins Tabs (Multiple vitamin) .... Take one by mouth once daily 2)  Tri-sprintec 0.18/0.215/0.25 Mg-35 Mcg Tabs (Norgestim-eth estrad triphasic) .... As directed 3)  Remicade 100 Mg Solr (Infliximab) .[name].. 63m/kg every 8 weeks 4)  Lunesta 3 Mg Tabs (Eszopiclone) ..[name]. 1 by mouth hs as needed for sleep  Patient Instructions: 1)  can use lunesta as needed 2)  call in 2-3 weeks if havenet heard about thyroid tests. 3)  other wise yearly visits or as needed  4)  let u[place]now when you had  last  tetanus shot .   Orders Added: 1)  Est. Patient 18-39 years [[45625]2)  Est. Patient Level II [21308]  Appended Document: fup ok per dr Regis Bill to do//ccm Tell patient that her thyroid labs from DR  [facility] are normal this time.  ( tsh .64 and free t4 o.59  within reference range )  Recommend repeat TSH in 3 months either here or  elsewhere. ( dx abnormal thyroid test)   Appended Document: fup ok per dr Regis Bill to do//ccm Pt aware.

## 2010-08-13 LAB — DIFFERENTIAL
Eosinophils Absolute: 0.1 10*3/uL (ref 0.0–0.7)
Eosinophils Relative: 1 % (ref 0–5)
Lymphs Abs: 1.6 10*3/uL (ref 0.7–4.0)
Monocytes Relative: 8 % (ref 3–12)

## 2010-08-13 LAB — COMPREHENSIVE METABOLIC PANEL
ALT: 20 U/L (ref 0–35)
Albumin: 2.8 g/dL — ABNORMAL LOW (ref 3.5–5.2)
Alkaline Phosphatase: 44 U/L (ref 39–117)
Potassium: 4.5 mEq/L (ref 3.5–5.1)
Sodium: 138 mEq/L (ref 135–145)
Total Protein: 6.3 g/dL (ref 6.0–8.3)

## 2010-08-13 LAB — URINALYSIS, ROUTINE W REFLEX MICROSCOPIC
Nitrite: NEGATIVE
Specific Gravity, Urine: 1.01 (ref 1.005–1.030)
pH: 6 (ref 5.0–8.0)

## 2010-08-13 LAB — CBC
MCHC: 34.3 g/dL (ref 30.0–36.0)
Platelets: 229 10*3/uL (ref 150–400)
Platelets: 397 10*3/uL (ref 150–400)
RBC: 3.48 MIL/uL — ABNORMAL LOW (ref 3.87–5.11)
RBC: 4.21 MIL/uL (ref 3.87–5.11)
RDW: 17.4 % — ABNORMAL HIGH (ref 11.5–15.5)
WBC: 9.8 10*3/uL (ref 4.0–10.5)

## 2010-08-13 LAB — BASIC METABOLIC PANEL
BUN: 1 mg/dL — ABNORMAL LOW (ref 6–23)
Creatinine, Ser: 0.49 mg/dL (ref 0.4–1.2)
GFR calc non Af Amer: >60 mL/min (ref >60)
Potassium: 3.5 mEq/L (ref 3.5–5.1)

## 2010-08-13 LAB — URINE MICROSCOPIC-ADD ON

## 2010-08-13 LAB — PREGNANCY, URINE: Preg Test, Ur: NEGATIVE

## 2010-08-13 LAB — URINE CULTURE: Special Requests: NEGATIVE

## 2010-08-28 ENCOUNTER — Telehealth: Payer: Self-pay

## 2010-08-28 LAB — COMPREHENSIVE METABOLIC PANEL
ALT: 10 U/L (ref 0–35)
ALT: 11 U/L (ref 0–35)
AST: 15 U/L (ref 0–37)
Albumin: 2.4 g/dL — ABNORMAL LOW (ref 3.5–5.2)
Alkaline Phosphatase: 37 U/L — ABNORMAL LOW (ref 39–117)
BUN: 3 mg/dL — ABNORMAL LOW (ref 6–23)
CO2: 26 mEq/L (ref 19–32)
CO2: 27 mEq/L (ref 19–32)
Calcium: 8.3 mg/dL — ABNORMAL LOW (ref 8.4–10.5)
Chloride: 104 mEq/L (ref 96–112)
Chloride: 106 mEq/L (ref 96–112)
Creatinine, Ser: 0.7 mg/dL (ref 0.4–1.2)
GFR calc Af Amer: >60 mL/min (ref >60)
GFR calc non Af Amer: >60 mL/min (ref >60)
GFR calc non Af Amer: >60 mL/min (ref >60)
Glucose, Bld: 102 mg/dL — ABNORMAL HIGH (ref 70–99)
Glucose, Bld: 117 mg/dL — ABNORMAL HIGH (ref 70–99)
Potassium: 3.4 mEq/L — ABNORMAL LOW (ref 3.5–5.1)
Sodium: 134 mEq/L — ABNORMAL LOW (ref 135–145)
Sodium: 138 mEq/L (ref 135–145)
Total Bilirubin: 0.2 mg/dL — ABNORMAL LOW (ref 0.3–1.2)
Total Bilirubin: 0.5 mg/dL (ref 0.3–1.2)
Total Protein: 5.8 g/dL — ABNORMAL LOW (ref 6.0–8.3)

## 2010-08-28 LAB — CBC
HCT: 35.1 % — ABNORMAL LOW (ref 36.0–46.0)
HCT: 37.6 % (ref 36.0–46.0)
Hemoglobin: 11.8 g/dL — ABNORMAL LOW (ref 12.0–15.0)
Hemoglobin: 11.8 g/dL — ABNORMAL LOW (ref 12.0–15.0)
MCHC: 32.4 g/dL (ref 30.0–36.0)
MCV: 89.4 fL (ref 78.0–100.0)
MCV: 90.9 fL (ref 78.0–100.0)
Platelets: 249 10*3/uL (ref 150–400)
Platelets: 290 10*3/uL (ref 150–400)
Platelets: 300 10*3/uL (ref 150–400)
RBC: 3.93 MIL/uL (ref 3.87–5.11)
RBC: 4 MIL/uL (ref 3.87–5.11)
RBC: 4.05 MIL/uL (ref 3.87–5.11)
RBC: 4.13 MIL/uL (ref 3.87–5.11)
RDW: 15.7 % — ABNORMAL HIGH (ref 11.5–15.5)
WBC: 7.3 10*3/uL (ref 4.0–10.5)
WBC: 9 10*3/uL (ref 4.0–10.5)
WBC: 9.6 10*3/uL (ref 4.0–10.5)

## 2010-08-28 LAB — SEDIMENTATION RATE: Sed Rate: 34 mm/hr — ABNORMAL HIGH (ref 0–22)

## 2010-08-28 LAB — CULTURE, ROUTINE-ABSCESS

## 2010-08-28 LAB — DIFFERENTIAL
Basophils Relative: 1 % (ref 0–1)
Eosinophils Relative: 1 % (ref 0–5)
Lymphocytes Relative: 22 % (ref 12–46)
Lymphs Abs: 1.7 10*3/uL (ref 0.7–4.0)
Lymphs Abs: 2 10*3/uL (ref 0.7–4.0)
Monocytes Relative: 6 % (ref 3–12)
Neutro Abs: 7.1 10*3/uL (ref 1.7–7.7)
Neutrophils Relative %: 75 % (ref 43–77)

## 2010-08-28 NOTE — Telephone Encounter (Signed)
Message copied by Christian Mate on Mon Aug 28, 2010  8:40 AM ------      Message from: Christian Mate      Created: Tue Aug 01, 2010  1:31 PM       Annual skin test due

## 2010-08-28 NOTE — Telephone Encounter (Signed)
Left message on machine to call back  

## 2010-08-29 LAB — BODY FLUID CULTURE

## 2010-08-29 LAB — DIFFERENTIAL
Basophils Absolute: 0 10*3/uL (ref 0.0–0.1)
Basophils Relative: 0 % (ref 0–1)
Eosinophils Absolute: 0.1 10*3/uL (ref 0.0–0.7)
Eosinophils Relative: 1 % (ref 0–5)
Lymphs Abs: 3.7 10*3/uL (ref 0.7–4.0)
Neutrophils Relative %: 47 % (ref 43–77)

## 2010-08-29 LAB — COMPREHENSIVE METABOLIC PANEL
ALT: 23 U/L (ref 0–35)
AST: 14 U/L (ref 0–37)
Alkaline Phosphatase: 49 U/L (ref 39–117)
CO2: 29 mEq/L (ref 19–32)
Calcium: 8.7 mg/dL (ref 8.4–10.5)
GFR calc Af Amer: >60 mL/min (ref >60)
Glucose, Bld: 92 mg/dL (ref 70–99)
Potassium: 4 mEq/L (ref 3.5–5.1)
Sodium: 134 mEq/L — ABNORMAL LOW (ref 135–145)
Total Protein: 6.8 g/dL (ref 6.0–8.3)

## 2010-08-29 LAB — CBC
HCT: 40.3 % (ref 36.0–46.0)
MCV: 91.1 fL (ref 78.0–100.0)
Platelets: 363 10*3/uL (ref 150–400)
Platelets: 386 10*3/uL (ref 150–400)
RBC: 4.7 MIL/uL (ref 3.87–5.11)
RDW: 14.6 % (ref 11.5–15.5)
WBC: 10 10*3/uL (ref 4.0–10.5)
WBC: 10.2 10*3/uL (ref 4.0–10.5)

## 2010-08-29 LAB — ANAEROBIC CULTURE

## 2010-08-29 NOTE — Telephone Encounter (Signed)
Left message on machine to call back letter mailed. 

## 2010-09-06 ENCOUNTER — Ambulatory Visit (INDEPENDENT_AMBULATORY_CARE_PROVIDER_SITE_OTHER): Payer: BC Managed Care – PPO | Admitting: Gastroenterology

## 2010-09-06 DIAGNOSIS — K5080 Crohn's disease of both small and large intestine without complications: Secondary | ICD-10-CM

## 2010-09-08 ENCOUNTER — Encounter: Payer: BC Managed Care – PPO | Admitting: Gastroenterology

## 2010-09-08 LAB — TB SKIN TEST
Induration: NEGATIVE
TB Skin Test: NEGATIVE mm

## 2010-09-25 ENCOUNTER — Encounter: Payer: Self-pay | Admitting: Gastroenterology

## 2010-09-25 ENCOUNTER — Other Ambulatory Visit (INDEPENDENT_AMBULATORY_CARE_PROVIDER_SITE_OTHER): Payer: BC Managed Care – PPO

## 2010-09-25 ENCOUNTER — Ambulatory Visit (INDEPENDENT_AMBULATORY_CARE_PROVIDER_SITE_OTHER): Payer: BC Managed Care – PPO | Admitting: Gastroenterology

## 2010-09-25 VITALS — BP 118/76 | HR 80 | Ht 63.0 in | Wt 213.0 lb

## 2010-09-25 DIAGNOSIS — R8290 Unspecified abnormal findings in urine: Secondary | ICD-10-CM

## 2010-09-25 DIAGNOSIS — K5090 Crohn's disease, unspecified, without complications: Secondary | ICD-10-CM

## 2010-09-25 DIAGNOSIS — R82998 Other abnormal findings in urine: Secondary | ICD-10-CM

## 2010-09-25 LAB — URINALYSIS
Ketones, ur: NEGATIVE
Leukocytes, UA: NEGATIVE
Nitrite: NEGATIVE
Specific Gravity, Urine: 1.015 (ref 1.000–1.030)
Urobilinogen, UA: 0.2 (ref 0.0–1.0)

## 2010-09-25 NOTE — Progress Notes (Signed)
Review of gastrointestinal problems:  1. Crohn ileitis. Presented like an acute appendicitis 2006. Surgery, not in [place], was much more suggestive of Crohn's disease. She did not respond to immunomodulators and so eventually she Began Remicade April 2007 While continuing on azathioprine. Early 2008, stopped azathioprine and she tolerated solo maintenance Remicade (5 mg per kilogram) well. April 2009 continues to tolerate Remicade infusions very well, getting her infusions at Dr. Tanna Furry office. 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 Her Remicade dosing was increased to 10 mg per kilogram. Winter, 2010 presented with right lower quadrant abscess, clear fistula connection between small bowel and the abscess. 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 Infection was drained with interventional radiology, drain eventually pulled however abscess recurred within days and so right lower quadrant drain was replaced. January, 2011: she is on azathioprine 100 mg a day, Augmentin twice daily, planning for surgery late January. January, 2011: ileocecectomy performed for active disease, persistent fistula, abscess  2. May, 2011: polynidol cyst drained by urgent care   HPI: This is a very pleasant 30 year old woman whom I last saw in June 2011.  Was doing well on remicade q 8 weeks.  Started to have right sided distension/discomfort. Right sided abd wall hernia.  Had another polynidal cyst (winter then also another 2-3 weeks ago).   Planning on hernia repair the end of June with Dr. Zenia Resides.  Last remicade was early mid April.  The infusions are going well.    She has minor diarrhea at times, no obvious bleeding.  Does have mild right sided "gas like pains".   Also had pnumonia in November, not hospitalized.   Physical Exam: BP 118/76  Pulse 80  Ht 5' 3"  (1.6 m)  Wt 213 lb  (96.616 kg)  BMI 37.73 kg/m2 Constitutional: generally well-appearing Psychiatric: alert and oriented x3 Abdomen: soft, nontender, nondistended, no obvious ascites, no peritoneal signs, normal bowel sounds; right sided abdominal wall defect at the site of abdominal scar. Nontender.    Assessment and plan: 30 y.o. female with  Crohn's disease, right-sided abdominal hernia  Currently she is planning on having surgery to fix this abdominal wall defect in one or 2 months. She does have gas pains, mild right-sided discomforts. Not sure if this is related to her abdominal hernia. She is planning on having hernia surgery and I'm going to get in touch with her surgeon to discuss this.  She also mentioned that she has noticed some cloudy urine lately. No difficulty or pain with urination. We will order a urinalysis and urine culture

## 2010-09-25 NOTE — Patient Instructions (Addendum)
Return to see Dr. Ardis Hughs in late July (about a month after your upcoming surgery). Continue remicade every 8 weeks. Urinalysis, urine culture today for cloudy urine lately. Dr. Ardis Hughs will contact Dr. Zenia Resides to discuss the right sided hernia surgery that is planned for later this summer.

## 2010-09-27 LAB — URINE CULTURE

## 2010-10-06 ENCOUNTER — Telehealth: Payer: Self-pay | Admitting: Gastroenterology

## 2010-10-06 NOTE — Telephone Encounter (Signed)
Dr Ardis Hughs, you saw pt on 09/25/10 and wrote you will speak with Dr Zenia Resides, surgeon about pt's hernia surgery. Please advise. Thanks.

## 2010-10-09 NOTE — Telephone Encounter (Signed)
I spoke with her on the phone this morning, her hernia does seem fairly bothersome and it is enlarging. I agree with her going ahead and getting it fixed now.

## 2010-10-10 NOTE — Assessment & Plan Note (Signed)
[facility] OFFICE NOTE   [name], [name]                         MRN:          [id number]  DATE:04/01/2007                            DOB:          11-08-1980    PRIMARY CARE PHYSICIAN:  Sandy Salaam. Shawna Orleans, DO   GI PROBLEM LIST:  1. Perforated appendicitis.  Appendectomy in August 2006.  There was      question during surgery of Crohn disease, although surgical      followup later told her there was no evidence of Crohn disease.      She had postoperative right lower quadrant inflammation, phlegmon.      Admitted to [facility] for 10 days with right lower quadrant      phlegmon, question abscess, treated with intravenous antibiotics.  2. Crohn ileitis.  Serial CT scans showing significant, right-sided      inflammation and ileal thickening.  Inflammatory bowel disease      First Step performed, results pending.  Colonoscopy October 2006      with normal colon but inflamed terminal ileum.  Biopsies confirmed      normal colon and chronically inflamed terminal ileum.  No      granulomas.  Began Entocort, October 2006, 9 mg once daily.  During      Entocort taper, she  had Crohn's flare with pain and went to      outside emergency room.  She was admitted with IV steroids and sent      home on prednisone, Cipro, and Flagyl.  Began azathioprine January      2007. Increased azathioprine to 150 mg daily, February 2007.      Significant abdominal pain March 2007.  CT showed continued      significant inflammation, a question of abscess in left side      pelvis.  Repeat CT to attempt drainage of this fluid collection      showed fluid collection had improved after just 2 to 3 days of      increased steroids to 40 mg daily.  Began Remicade April 2007.      Tapered off steroids, tolerating Remicade and Azathioprine well.      March 2008, off steroids, off azathioprine, tolerating solo      maintenance Remicade well.   INTERVAL HISTORY:  I saw [name] [name] 4 months ago.  She has been receiving  Remicade infusions 5 mg/kg every 8 weeks and is tolerating this very  well.  She is completely off all of her other Crohn's medicines.  She  has 1-2 soft, brown, non-bloody stools a day.  She has no abdominal  pain.  She has no nausea or vomiting.  No obstructive symptoms.  She did  have some oral thrush recently and was followed by Urgent Care and took  what sounds like 2 pills of fluconazole and that cleared up very  quickly.  She has never had this before, and has had no trouble since  then.  Her last Remicade dose was about a month ago.   CURRENT MEDICINES:  1. Remicade infusions  5 mg/kg q 8 weeks, last dose mid October.  2. Caltrate.  3. Multivitamin.   PHYSICAL EXAMINATION:  VITAL SIGNS:  Weight 155 pounds, blood pressure  90/60, pulse 72.  CONSTITUTIONAL:  Generally well.  LUNGS:  Clear to auscultation bilaterally.  ABDOMEN:  Soft, nontender, nondistended.  Normoactive bowel sounds.   ASSESSMENT AND PLAN:  A 30 year old woman with Crohn's ileitis.   She is doing very well on Remicade 5 mg/kg q 8 weeks.  She will continue  on this for now.  She has a CBC and a complete metabolic profile checked  at the time of her Remicade and will continue doing that.  We will look  into her having her Remicade transfusions done at Dr. Tanna Furry office, who  is a rheumatologist in town, who has a very smooth and efficient system  for outpatient Remicade infusions.     Milus Banister, MD  Electronically Signed    DPJ/MedQ  DD: 04/01/2007  DT: 04/02/2007  Job #: [id number]   cc:   Sandy Salaam. Lucien Mons, M.D.

## 2010-10-10 NOTE — Assessment & Plan Note (Signed)
[facility] OFFICE NOTE   [name], [name]                         MRN:          [id number]  DATE:09/19/2007                            DOB:          Oct 21, 1980    PRIMARY CARE PHYSICIAN:  Sandy Salaam. Shawna Orleans, DO   PROBLEM LIST:  1. Perforated appendicitis.  Appendectomy in August 2006.  There was      question during surgery of Crohn disease, although surgical      followup later told her there was no evidence of Crohn disease.      She had postoperative right lower quadrant inflammation, phlegmon.      Admitted to [facility] for 10 days with right lower quadrant      phlegmon, question abscess, treated with intravenous antibiotics.  2. Crohn ileitis.  Serial CT scans showing significant, right-sided      inflammation and ileal thickening.  Inflammatory bowel disease      First Step performed, results pending.  Colonoscopy October 2006      with normal colon but inflamed terminal ileum.  Biopsies confirmed      normal colon and chronically inflamed terminal ileum.  No      granulomas.  Began Entocort, October 2006, 9 mg once daily.  During      Entocort taper, she  had Crohn's flare with pain and went to      outside emergency room.  She was admitted with IV steroids and sent      home on prednisone, Cipro, and Flagyl.  Began azathioprine January      2007. Increased azathioprine to 150 mg daily, February 2007.      Significant abdominal pain March 2007.  CT showed continued      significant inflammation, a question of abscess in left side      pelvis.  Repeat CT to attempt drainage of this fluid collection      showed fluid collection had improved after just 2 to 3 days of      increased steroids to 40 mg daily.  Began Remicade April 2007.      Tapered off steroids, tolerating Remicade and Azathioprine well.      March 2008, off steroids, off azathioprine, tolerating solo      maintenance Remicade well.  3.  April 2009 continues to tolerate Remicade infusions very well,      getting her infusions at Dr. Tanna Furry office.   INTERVAL HISTORY:  I last saw [name] five months ago.  She continues to  receive Remicade infusions q 8 weeks.  Now she is doing these at Dr.  Tanna Furry office.  She has no problems with diarrhea, constipation,  abdominal pain, fevers, chills, rashes, skin lesions.  She feels very  well overall.   CURRENT MEDICINES:  Caltrate, Flintstone multivitamin, Remicade 5 mg per  kg every 8 weeks.   PHYSICAL EXAMINATION:  Weight 164 pounds which is up 9 pounds since her  last visit.  Blood pressure 110/58, pulse 68.  CONSTITUTIONAL:  Generally well-appearing, anicteric sclerae.  LUNGS:  Clear to auscultation bilaterally.  HEART:  Regular rate and rhythm.  ABDOMEN:  Soft, nontender, nondistended, normal bowel sounds.  EXTREMITIES:  No lower extremity edema.   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   She is doing very well on Remicade 5 mg per kg every 8 weeks.  She will  continue to do this for now.  She will get a CBC and a complete  metabolic profile today.  She will return to see me in three to four  months or sooner if needed.  For now, she will continue getting her  Remicade infusions every eight weeks at Dr. Tanna Furry office.     Milus Banister, MD  Electronically Signed    DPJ/MedQ  DD: 09/19/2007  DT: 09/19/2007  Job #: [id number]   cc:   Michael Litter, M.D.  Sandy Salaam. Shawna Orleans, DO

## 2010-10-10 NOTE — Assessment & Plan Note (Signed)
[place] OFFICE NOTE   NAME:[name], [name]                         MRN:          [id number]  DATE:12/02/2006                            DOB:          [birth year]    PRIMARY CARE PHYSICIAN:  Dr. Drema Pry   GI PROBLEM LIST:  1. Perforated appendicitis.  Appendectomy in August 2006.  There was      question during surgery of Crohn disease, although surgical      followup later told her there was no evidence of Crohn disease.      She had postoperative right lower quadrant inflammation, phlegmon.      Admitted to [facility] for 10 days with right lower quadrant      phlegmon, question abscess, treated with intravenous antibiotics.  2. Crohn ileitis.  Serial CT scans showing significant, right-sided      inflammation and ileal thickening.  Inflammatory bowel disease      First Step performed, results pending.  Colonoscopy October 2006      with normal colon but inflamed terminal ileum.  Biopsies confirmed      normal colon and chronically inflamed terminal ileum.  No      granulomas.  Began Entocort, October 2006, 9 mg once daily.  During      Entocort taper, she  had Crohn's flare with pain and went to      outside emergency room.  She was admitted with IV steroids and sent      home on prednisone, Cipro, and Flagyl.  Began azathioprine January      2007. Increased azathioprine to 150 mg daily, February 2007.      Significant abdominal pain March 2007.  CT showed continued      significant inflammation, a question of abscess in left side      pelvis.  Repeat CT to attempt drainage of this fluid collection      showed fluid collection had improved after just 2 to 3 days of      increased steroids to 40 mg daily.  Began Remicade April 2007.      Tapered off steroids, tolerating Remicade and Azathioprine well.      March 2008, off steroids, off azathioprine, tolerating solo      maintenance Remicade well.   FAMILY HISTORY:  I last saw [name] 2-1/2 months ago.  She has been  receiving Remicade infusions 5 mg/kg every 8 weeks and is tolerating  this very well.  She is completely off all other Crohn's medicines.  She  has 1-2 soft but solid brown bowel movements a day, non-bloody. She has  no diarrhea, no abdominal pains.  She has no obstructive type symptoms  of nausea, vomiting or bloating after eating.   CURRENT MEDICINES:  1. Remicade infusions 5 mg/kg q. 8 weeks.  2. Caltrate.  3. Multivitamin.   Weight 148 pounds which is stable.  Blood pressure 110/58, pulse 68.  CONSTITUTIONAL:  Generally well-appearing.  LUNGS:  Clear to auscultation bilaterally.  HEART:  Regular rate and rhythm.  ABDOMEN:  Soft, nontender, nondistended. Normal  bowel sounds.   ASSESSMENT AND PLAN:  A 30 year old woman with Crohn's ileitis.   She is doing very well on Remicade solo therapy.  She will continue on  this.  She has a CBC and a complete metabolic profile checked at the  time of her Remicade and we will continue doing that.  She will see me  again in 4 months and sooner if needed.     Milus Banister, MD  Electronically Signed    DPJ/MedQ  DD: 12/02/2006  DT: 12/02/2006  Job #: [id number]   cc:   Sandy Salaam. Shawna Orleans, DO

## 2010-10-10 NOTE — Assessment & Plan Note (Signed)
[facility]                           PRIMARY CARE OFFICE NOTE   KETRA, DUCHESNE                         MRN:          [id number]  DATE:11/18/2006                            DOB:          06-18-1980    CHIEF COMPLAINT:  New patient to practice.   HISTORY OF PRESENT ILLNESS:  The patient is a 30 year old African-  American female here to establish primary care.  She is referred by Dr.  Oretha Caprice of [facility].  She has a past medical history of Crohn's  disease discovered in 2006.  She had a perforated appendix and there was  a question of postop right lower quadrant inflammation.  Subsequent CT  showed ileal thickening and biopsy confirmed Crohn's disease.  She was  initially started on IV prednisone and began azathioprine in January of  2007, then started Remicade April of 2007, tapering off steroids.  She  has a 20 pound weight loss after being diagnosed with Crohn's but on  maintenance Remicade has been able to regain most of that weight.   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   CURRENT MEDICATIONS:  1. Flintstone multivitamin.  2. Caltrate with Vitamin D b.i.d.   ALLERGIES TO MEDICATIONS:  None known.   SOCIAL HISTORY:  Patient is single, currently living with a roommate and  is employed as a Psychologist, educational.   FAMILY HISTORY:  Mother deceased secondary to brain aneurysm, father is  alive at age 62, he is fairly healthy.  Patient denies any history of  inflammatory bowel disease in her family.  She has a half sister that is  diabetic and bipolar.  There is colon cancer in the  maternal  grandmother and paternal grandfather is known to have stomach cancer.   HABITS:  1. No alcohol.  2. No tobacco.  3. Denies any recreational drug use.   PREVENTIVE CARE HISTORY:  1. Her last Pap was in 2007.  2. Last menstrual period June 17th.  3. She is not sexually  active.   REVIEW OF SYSTEMS:  No fevers, chills.  Patient denies any vision  changes, no skin changes, no chest pain, shortness of breath or cough.  Denies heartburn.  All other systems negative.   PHYSICAL EXAM:  VITALS:  Height is 5 feet 3 inches, weight is 145.2  pounds, temperature is 97.3, pulse is 64, BP is 109/63.  GENERAL:  The patient is a very pleasant 30 year old African-American  female in no apparent distress.  HEENT:  Normocephalic, atraumatic.  Pupils were equal and reactive to  light bilaterally.  Extraocular motility was intact.  Patient was  anicteric.  Conjunctiva was within normal limits.  Patient had bilateral  cerumen of her external auditory canals, could not visualize tympanic  membranes.  Hearing was grossly normal.  Oropharyngeal exam was  unremarkable.  Normal dentition.  NECK:  Supple, no adenopathy, carotid bruit, no thyromegaly or thyroid  nodules.  CHEST EXAM:  Normal respiratory effort.  LUNGS:  Clear  to auscultation bilaterally.  No rubs, no rales or  wheezing.  CARDIOVASCULAR:  Regular rate and rhythm.  No significant murmurs, rubs  or gallops appreciated.  ABDOMEN:  Soft and nontender, positive bowel sounds, no organomegaly.  MUSCULOSKELETAL:  No clubbing, cyanosis or edema.  SKIN:  Warm and dry.  NEUROLOGIC:  Cranial nerves II-XII grossly intact.   Review of laboratory data shows recent CBC of February, 2008, WBC 6.8,  H&H of 14 and 41.5, platelet count of 276,000.  Comprehensive metabolic  profile notable for glucose 66, BUN 9, creatinine 0.64, slightly  depressed albumin at 3.3.   IMPRESSION/RECOMMENDATIONS:  1. History of Crohn's disease currently in remission with Remicade.  2. History of knee surgery times three.  3. Health maintenance.   RECOMMENDATIONS:  1. She is looking for a new OB/GYN.  I recommended HPV testing as well      as consideration of Gardasil.  2. Discussed general health measures including normal weight.  We       established target weight of approximately 130 pounds.  We      discussed weight loss strategies.  3. She had screening labs this morning including cholesterol and TSH.  4. Follow up in 6 months to 1 year.     Sandy Salaam. Shawna Orleans, DO  Electronically Signed    RDY/MedQ  DD: 11/18/2006  DT: 11/18/2006  Job #: [id number]

## 2010-10-13 NOTE — H&P (Signed)
[name], [name]                ACCOUNT NO.:  [id number]   MEDICAL RECORD NO.:  [id number]          PATIENT TYPE:  EMS   LOCATION:  ED                           FACILITY[facility]   PHYSICIAN:  Felicita Gage, MD     DATE OF BIRTH:  Sep 03, 1980   DATE OF ADMISSION:  01/15/2005  DATE OF DISCHARGE:                                HISTORY & PHYSICAL   CHIEF COMPLAINT:  Abdominal pain, nausea and fever x2 days.   HISTORY OF PRESENT ILLNESS:  [name] is a very pleasant 30 year old  African-American woman status post appendectomy on 12/29/04 by a Dr. Elie Confer  at [facility] in [place], [place].  The patient states  that she felt well after the surgery.  She was discharged on hospital day  #2, on a liquid diet for three to four days after which she was switched to  a solid diet.  She was also on p.o. amoxicillin.  Followup appointment a  week after the surgery was determined to be normal.  The patient was advised  to resume a regular diet and was also advised that any heavy lifting or  straining may still elicit some pain in the area of the appendectomy scar.  The patient states that on the 16th she lifted some heavy pipes and felt  some pain with gas and fever and these symptoms resolved after she ate.  On  the morning of 8/21 the patient ate breakfast, passed some gas, had a bowel  movement and felt sore in the abdomen.  She  took some Mylanta, felt  nauseous, slightly lightheaded, no syncope, no vomiting but the pain  persisted. She was then evaluated by the school nurse and sent to [facility] Emergency Room for evaluation.   The patient stated that she has been feeling like she had a fever and took  Tylenol after which the fever broke.  No other symptoms at the present time.   PAST MEDICAL HISTORY:  1.  Bilateral ovarian cysts.  2. Bilateral knee surgery status post fall and      trauma.  3. Umbilical hernia as a child.  4. Status post appendectomy       12/29/04.   ADMISSION MEDICATIONS:  None.   ALLERGIES:  None.   FAMILY HISTORY:  Significant for mother dying of a cardiac aneurysm, father  has CHF and arthritis, one sister has diabetes mellitus.   SOCIAL HISTORY:  The patient is a Electronics engineer.  No alcohol nor tobacco  use.  The patient socially drinks three to four drinks a month.  No illicit  drug use.   REVIEW OF SYSTEMS:  Greater than 10 systems reviewed, other than HPI,  negative.   PHYSICAL EXAMINATION:  Blood pressure 121/71, pulse 104, respirations 20,  temperature 101.4.  GENERAL: Very pleasant, young, Caucasian woman lying in bed in no acute  distress.  Alert and oriented x3, cooperative with the exam.  HEENT: Normocephalic, atraumatic, PERRL, sclerae anicteric, mucous membranes  moist, conjunctivae pale.  NECK: Supple, no LAD, no JVD.  LUNGS: Clear to  auscultation bilaterally, no wheezes, no rales.  CARDIOVASCULAR SYSTEMS: S1, S2, regular rate and rhythm, no murmurs, rubs or  gallops.  ABDOMEN: Soft, positive bowel sounds, mild epigastric and left upper  quadrant tenderness.  Tenderness also in the area of the appendectomy scar  around McBurney's point.  No rebound or guarding noted.  EXTREMITIES: No clubbing, cyanosis or edema.  NEUROLOGIC EXAM: Nonfocal.   X-RAYS:  Abdominal x-ray series mild ileus versus partial small-bowel  obstruction left mid abdomen.   LABORATORIES:  WBC 16.8, hemoglobin 10.7, hematocrit 33.3, MCV 70.8,  platelets 851, urinalysis negative, sodium 132, potassium 3.6, chloride 97,  carbon dioxide 24, glucose 75, BUN 4, creatinine 0.8, calcium 9.0.   ASSESSMENT AND PLAN:  A 30 year old Caucasian woman, with abdominal pain,  nausea, fever x2 days.   1.  Abdominal pain, nausea, fever.  X-rays indicate partial small-bowel      obstruction verus mild ileus.  In light of the patient's recent surgery      we will cover her with antibiotics for possible abscess secondary to the       appendectomy.  She will be started on ciprofloxacin 400 mg IV q.12h. and      also metronidazole 1 g IV q.12 h.  She will be given morphine for pain,      Phenergan 12.5 mg IV q.4 h. For nausea.  She will also be given IV      fluids, normal saline at 125 mL per hour and she will be n.p.o. until      tomorrow.  If her pain is decreased we will start her on a clear diet      and advance as tolerated.  She is on Tylenol p.r.n. fever/pain as well.   1.  Anemia.  From the numbers, microcytic anemia likely secondary to blood      loss.  We will start her on IM supplement empirically.  Further      management will be deferred to primary care.  We will also check some      iron studies.   1.  Thrombocytosis.  The patient's platelets are 851, likely reactive.  We      do not have old records to compare at the present time.  We will      continue to monitor her platelets and plan further workup as needed.   DISPOSITION:  If patient's abdominal pain resolves with the present  management and she is able to tolerate p.o. without difficulty she will be  discharged with PCP followup.  If not, she will be seen by surgery for  possible I&D or further operative management.  Of note, the patient had a CT  scan of the abdomen pending at the time of this dictation.   PROPHYLAXIS:  The patient is on SCDs for DVT prophylaxis, Protonix for GI  prophylaxis.      Felicita Gage, MD  Electronically Signed     GDK/MEDQ  D:  01/15/2005  T:  01/15/2005  Job:  [id number]

## 2010-10-13 NOTE — Assessment & Plan Note (Signed)
[place] OFFICE NOTE   NAME:[name], [name]                         MRN:          [id number]  DATE:08/20/2006                            DOB:          May 16, 1981    GI PROBLEM LIST:  1. Perforated appendicitis.  Appendectomy in August 2006.  There was      question during surgery of Crohn disease, although surgical      followup later told her there was no evidence of Crohn disease.      She had postoperative right lower quadrant inflammation, phlegmon.      Admitted to [facility] for 10 days with right lower quadrant      phlegmon, question abscess, treated with intravenous antibiotics.  2. Crohn ileitis.  Serial CT scans showing significant, right-sided      inflammation and ileal thickening.  Inflammatory bowel disease      First Step performed, results pending.  Colonoscopy October 2006      with normal colon but inflamed terminal ileum.  Biopsies confirmed      normal colon and chronically inflamed terminal ileum.  No      granulomas.  Began Entocort, October 2006, 9 mg once daily.  During      Entocort taper, she  had Crohn's flare with pain and went to      outside emergency room.  She was admitted with IV steroids and sent      home on prednisone, Cipro, and Flagyl.  Began azathioprine January      2007. Increased azathioprine to 150 mg daily, February 2007.      Significant abdominal pain March 2007.  CT showed continued      significant inflammation, a question of abscess in left side      pelvis.  Repeat CT to attempt drainage of this fluid collection      showed fluid collection had improved after just 2 to 3 days of      increased steroids to 40 mg daily.  Began Remicade April 2007.      Tapered off steroids, tolerating Remicade and Azathioprine well.      March 2008, off steroids, off azathioprine, tolerating solo      maintenance Remicade well.   INTERVAL HISTORY:  I last saw [place] 3 months  ago.  She has been  receiving Remicade infusions q. 8 weeks at 5 mg per kg.  She is  tolerating this very well.  She is completely off azathioprine, and has  not been on steroids for months.  She has noticed in the past 2 to 3  weeks that she has had slight increased frequency, but her stools are  still solid and formed.  She is going 2 to 3 times a day.  No increase  in the amount of stool.  She has had no abdominal cramping.  She had no  eye symptoms, joint problems, or rashes or lesions on her skin.   CURRENT MEDICATIONS:  1. Remicade infusions q. 8 weeks.  2. Caltrate.  3. Multivitamin.   PHYSICAL EXAMINATION:  Weight 148  pounds.  Blood pressure 110/78.  Pulse  78.  CONSTITUTIONAL:  Generally well appearing.  LUNGS:  Clear to auscultation bilaterally.  HEART:  Regular rate and rhythm.  ABDOMEN:  Soft, non-tender, non-distended.  Normal bowel sounds.   ASSESSMENT AND PLAN:  A 30 year old woman with Crohn's ileitis.   She is doing very well on maintenance Remicade as solo therapy.  She  gets lab tests with her Remicade infusions.  The last set was 1 month  ago, and her CBC was completely normal, and her CMET was normal except  for a mildly decreased albumin of 3.3, and a glucose of 66.  Her recent  slight increase in frequency, I do not believe is Crohn's related.  She  has been exercising a bit more lately, and perhaps this has changed her  bowel regimen a bit.  She knows to get in touch with me if this change  gets worse though.  For the time being, she will remain on Remicade q. 8  weeks with labs done prior to each infusion.  She will return to see me  in 3 months.  She was also interested in getting set up for a primary  care physician, and I will arrange that to be done with our [place]  team.     Milus Banister, MD  Electronically Signed    DPJ/MedQ  DD: 08/20/2006  DT: 08/20/2006  Job #: [id number]

## 2010-10-13 NOTE — Assessment & Plan Note (Signed)
[place] OFFICE NOTE   NAME:DAVISFatime, [name]                         MRN:          [id number]  DATE:04/29/2006                            DOB:          [birth year]    GI PROBLEM LIST:  1. Perforated appendicitis.  Appendectomy in August 2006.  There was      question during surgery of Crohn disease, although surgical      followup later told her there was no evidence of Crohn disease.      She had postoperative right lower quadrant inflammation, phlegmon.      Admitted to [facility] for 10 days with right lower quadrant      phlegmon, question abscess, treated with intravenous antibiotics.  2. Crohn ileitis.  Serial CT scans showing significant, right-sided      inflammation and ileal thickening.  Inflammatory bowel disease      First Step performed, results pending.  Colonoscopy October 2006      with normal colon but inflamed terminal ileum.  Biopsies confirmed      normal colon and chronically inflamed terminal ileum.  No      granulomas.  Began Entocort, October 2006, 9 mg once daily.  During      Entocort taper, she  had Crohn's flare with pain and went to      outside emergency room.  She was admitted with IV steroids and sent      home on prednisone, Cipro, and Flagyl.  Began azathioprine January      2007. Increased azathioprine to 150 mg daily, February 2007.      Significant abdominal pain March 2007.  CT showed continued      significant inflammation, a question of abscess in left side      pelvis.  Repeat CT to attempt drainage of this fluid collection      showed fluid collection had improved after just 2 to 3 days of      increased steroids to 40 mg daily.  Began Remicade April 2007.      Tapered off steroids, tolerating Remicade and Azathioprine well.   INTERVAL HISTORY:  I last saw [name] 2-1/2 to 3 months ago.  She is  tolerating her q.8 week Remicade schedule very well, this is at 5 mg per  kg dosing.  She has also been taking azathioprine 150 mg p.o. daily,  that has been probably 7-8 months now.  Her last CBC showed a white  count of 5.9 thousand, this was the end of October, 2007.  She has felt  very well.  She did have some mild constipation two weeks ago but that  has since resolved.  She has had no urgency, no loss stools, no  bleeding, no dramatic abdominal pains.   CURRENT MEDICINES:  1. Azathioprine 150 mg daily.  2. Multivitamin.  3. Caltrate.  4. Remicade infusions 5 mg/kg q.8 weeks.   PHYSICAL EXAM:  Weight 154 pounds, up 7 pounds since her last visit.  Blood pressure 116/60, pulse 80.  CONSTITUTIONAL:  Generally well appearing.  HEART:  Regular  rate and rhythm.  LUNGS:  Clear to auscultation bilaterally.  ABDOMEN:  Soft, nontender, nondistended, normal bowel sounds.  EXTREMITIES:  No lower extremity edema.   ASSESSMENT AND PLAN:  A 30 year old woman with Crohn's ileitis.  She is  doing very well on every other month Remicade 5 mg/kg.  I have discussed  her case with some regional inflammatory bowel experts and I think it  seems reasonable to start tapering her off of one of her Crohn's  medicines, but I think the best choices are azathioprine, I am not sure  she ever really received Efectin, and recalling her past she was on it  for 2-3 months and still failing and that is when Remicade had to be  started.  Probably, it should have affected her before then.  She will  therefore stop her azathioprine, will see how she does on solely  Remicade for the next several months.  She does have scheduled labs at  the time of her infusions and we will continue to follow that.  She will  return to clinic in 3 months and sooner if needed.  She pays a $300 co-  pay and we have given her numbers to the [employer] representative to see  if there is any assistance plan that she qualifies for.     Milus Banister, MD  Electronically Signed    DPJ/MedQ  DD: 04/29/2006   DT: 04/30/2006  Job #: [id number]

## 2010-10-13 NOTE — Op Note (Signed)
[name], [name]                            ACCOUNT NO.:  [id number]   MEDICAL RECORD NO.:  [id number]                   PATIENT TYPE:  AMB   LOCATION:  DSC                                  FACILITY:  [facility]   PHYSICIAN:  Garlan Fair., M.D.         DATE OF BIRTH:  Oct 07, 1980   DATE OF PROCEDURE:  09/11/2002  DATE OF DISCHARGE:                                 OPERATIVE REPORT   PREOPERATIVE DIAGNOSIS:  Multiple chalazia, upper lids, both eyes.   POSTOPERATIVE DIAGNOSIS:  Multiple chalazia, upper lids, both eyes.   PROCEDURE:  Excision, multiple chalazia, upper lid, left and right eye.   ANESTHESIA:  Local using Xylocaine 2% and Marcaine 0.75% and Wydase, done  under IV sedation.   DESCRIPTION OF PROCEDURE:  Under the influence of IV sedation, the patient  was prepped and draped in the usual manner.  The upper lids of each eye were  infiltrated with several milliliters of Xylocaine.  There were three large  chalazia found to be located along the margin of the upper lid of the right  eye and two large chalazia located at the upper lid of the left eye.  A  chalazion clamp was applied over the larger lesion of the right eye.  The  lid was everted and a cruciate incision was made in the tarsus of the  lesion.  The lesion was curetted using chalazion curette, and the sac was  excised in total.  This same procedure was repeated for each of the other  two chalazia of the upper lid of the right eye.  Maxitrol ophthalmic  ointment and a pressure patch were applied.  Then the attention was given to  the left eye.  A chalazion clamp was applied over the larger lesion, which  was located centrally, and the lid was everted.  A cruciate incision was  made in the tarsus of the lesion, and the lesion was curetted using a  chalazion curette.  The sac was excised in total using sharp and blunt  dissection.  The chalazion clamp was applied over the smaller lesion and the  same procedure  was proceeded.  Hemostasis was achieved by using cautery.  Maxitrol ophthalmic ointment was applied along with a patch.  The patient  tolerated the procedure well and was discharged to the postanesthesia  recovery room in satisfactory condition.  She was instructed to keep both  eyes patched, but she is allowed to remove the patch over the left eye at  approximately 6 to 7 p.m. today.  She is to remove the patch over the right  eye tomorrow morning after 8 a.m.  She was instructed to call if there are  any problems.  She is to take Vicodin every four hours as needed for pain  and to resume her drops of __________ to both eyes four times a day when the  patches are removed.   DISCHARGE  DIAGNOSIS:  Multiple chalazia, upper lids, both eyes.                                               Garlan Fair., M.D.    TB/MEDQ  D:  09/11/2002  T:  09/11/2002  Job:  [id number]

## 2010-10-13 NOTE — Assessment & Plan Note (Signed)
[place] OFFICE NOTE   NAME:[name], [name]                         MRN:          [id number]  DATE:02/12/2006                            DOB:          March 04, 1981    Perforated appendicitis.  Appendectomy in August 2006.  There was question        during surgery of Crohn disease, although surgical followup later told        her there was no evidence of Crohn disease.  She had postoperative        right lower quadrant inflammation, phlegmon.  Admitted to [facility]        for 10 days with right lower quadrant phlegmon, question abscess,        treated with intravenous antibiotics.  2.    Crohn ileitis.  Serial CT scans showing significant, right-sided        inflammation and ileal thickening.  Inflammatory bowel disease First        Step performed, results pending.  Colonoscopy October 2006 with normal        colon but inflamed terminal ileum.  Biopsies confirmed normal colon        and chronically inflamed terminal ileum.  No granulomas.  Began        Entocort, October 2006, 9 mg once daily.  During Entocort taper, she        had Crohn's flare with pain and went to outside emergency room.  She        was admitted with IV steroids and sent home on prednisone, Cipro, and        Flagyl.  Began azathioprine January 2007. Increased azathioprine to        150 mg daily, February 2007.  Significant abdominal pain March 2007.        CT showed continued significant inflammation, a question of abscess in        left side pelvis.  Repeat CT to attempt drainage of this fluid        collection showed fluid collection had improved after just 2 to 3 days        of increased steroids to 40 mg daily.  Began Remicade April 2007.        Tapered off steroids, tolerating Remicade and Azathioprine well.   INTERVAL HISTORY:  I last saw [name] 3 months ago.  She has had 4 Remicade  infusions and is feeling very well.  She has one  to two bowel movements a  day.  Sometimes they are a little softer, sometimes a little harder.  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  She has had no fevers, chills, no  abdominal pains.   CURRENT MEDICATIONS:  1. Azathioprine 150 mg p.o. daily.  2. Remicade 5 mg/kg q.8 weeks.  3. Multivitamin.  4. Carafate.   PHYSICAL EXAMINATION:  Weight 147 pounds, up 2 pounds since her last visit.  Blood pressure 102/58, pulse 78.  CONSTITUTIONAL:  Generally well appearing.  NEUROLOGICAL:  Alert and oriented x3.  LUNGS:  Clear to auscultation bilaterally.  HEART:  Regular rate and rhythm.  ABDOMEN:  Soft, nontender, nondistended, normal bowel sounds.   ASSESSMENT AND PLAN:  A 30 year old woman with Crohn's ileitis.   She is doing very well on bimonthly Remicade and Azathioprine.  She needs a  CBC checked.  I will do that and liver tests to see if she is tolerating the  Azathioprine well.  If so, I will make no changes in her medicines and she  will continue Azathioprine 150 mg a day and Remicade q.8 weeks.  She will  return to see me in 3 months' time.                                   Milus Banister, MD   DPJ/MedQ  DD:  02/12/2006  DT:  02/13/2006  Job #:  [id number]

## 2010-10-13 NOTE — Discharge Summary (Signed)
[name], [name]                ACCOUNT NO.:  [id number]   MEDICAL RECORD NO.:  [id number]          PATIENT TYPE:  INP   LOCATION:  3825                         FACILITY:  [facility]   PHYSICIAN:  Minna Merritts, M.D.DATE OF BIRTH:  Dec 29, 1980   DATE OF ADMISSION:  01/15/2005  DATE OF DISCHARGE:  01/28/2005                                 DISCHARGE SUMMARY   ADDENDUM:  Please see the detailed discharge summary by Dr. Jacquelynn Cree,  dictated on January 26, 2005.   The patient continues to do well, currently afebrile, without any abdominal  pain, nausea, or vomiting. She will be discharged to home with the following  medications.   DISCHARGE MEDICATIONS:  1.  Augmentin 875 mg p.o. b.i.d. for another 7 days, to make a total of 2      weeks an antibiotics.  2.  Pentasa 250 mg 4 pills every day.  3.  Ferrous sulfate 325 mg p.o. daily.  4.  Calcium 500 mg with vitamin D, zinc, magnesium, over-the-counter      supplement every day.   DISCHARGE LABORATORY DATA:  White count of 12.4, hemoglobin and hematocrit  10.5 and 32.6, platelet count 448,000. Sodium 136, potassium 3.9, chloride  102, CO2 28, BUN 5, creatinine 0.7, blood sugar is 90. Calcium is 8.9.   SPECIAL INSTR5UCTIONS:  The patient is instructed to seek medical care if  she has diarrhea, fever, abdominal pain. She is also suggested to seek a  primary care physician for regular health care.   FOLLOWUP:  1.  The patient instructed to see Dr. Zella Richer in 2-3 weeks.  2.  She will see Dr. Ardis Hughs on February 14, 2005.           ______________________________  Minna Merritts, M.D.     PMJ/MEDQ  D:  01/28/2005  T:  01/28/2005  Job:  [id number]   cc:   Jacquelynn Cree, M.D.   Odis Hollingshead, M.D.  [address], [place]   [address]

## 2010-10-13 NOTE — Consult Note (Signed)
[name], [name]                ACCOUNT NO.:  [id number]   MEDICAL RECORD NO.:  [id number]          PATIENT TYPE:  INP   LOCATION:  1508                         FACILITY:  [facility]   PHYSICIAN:  Odis Hollingshead, M.D.DATE OF BIRTH:  [birth year]   DATE OF CONSULTATION:  DATE OF DISCHARGE:                                   CONSULTATION   REQUESTING PHYSICIAN:  Milus Banister, M.D., [facility].   REASON:  Right lower quadrant inflammatory process.   HISTORY OF PRESENT ILLNESS:  This is a 30 year old female who is  approximately postop day #18 from an open appendectomy from a ruptured  appendicitis.  Apparently, at the time, there was some concern for possible  Crohn's disease, although she was told that some tests were sent off, and  they were negative.  She was kept on antibiotics for a week postoperatively.  She came from [place] back to school at [employer], and for about the  past six days had not felt well, but yesterday got acutely ill with  increasing right lower quadrant and nausea.  Also had some constipation but  passed some gas.  She was noted to be febrile with a temperature of 101.4  and mildly tachycardic.  Her white cell count was elevated to 16,800.  A CT  scan demonstrated what appeared to be a complex inflammatory process in the  right lower quadrant.  She subsequently was admitted and started on IV  antibiotics.  Because of the question of Crohn's disease, a GI consultation  was obtained; however, this was not felt to be suggestive of Crohn's, given  her history and given the CT scan, but more like a postoperative infectious  process, and I was subsequently asked to see her.  Since starting IV  antibiotics, she feels much better.   PAST MEDICAL HISTORY:  1.  Perforated appendicitis.  2.  Umbilical hernia.  3.  Ovarian cyst.   PAST SURGICAL HISTORY:  1.  Umbilical hernia repair.  2.  Appendectomy.   ALLERGIES:  None.   CURRENT MEDICATIONS:  Include  Protonix, iron, Cipro, Flagyl, p.r.n. morphine  and Phenergan.   SOCIAL HISTORY:  Denies tobacco use.  Occasional alcohol use.  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   REVIEW OF SYSTEMS:  PULMONARY:  She says she has had a little cough since  the appendectomy that is nonproductive.  CARDIOVASCULAR:  No heart disease  or hypertension.   PHYSICAL EXAMINATION:  GENERAL:  A well-developed and well-nourished female  who appears to be in no acute distress.  She is pleasant and cooperative.  NECK:  Supple without masses.  RESPIRATORY:  Breath sounds are clear and equal.  Respirations unlabored.  CARDIOVASCULAR:  Heart demonstrates a regular rate and rhythm.  No murmur  heard.  ABDOMEN:  Soft with a right lower quadrant scar/incision that is clean and  intact with peri-incisional tenderness.  Some bowel sounds are heard.  No  obvious masses.  A small subumbilical incision is noted.   LABORATORY DATA:  White cell count is  14,000 today with a hemoglobin of 8.8.   CT scan was reviewed.  Demonstrates what appears to be a complex  inflammatory process in the right lower quadrant with small areas of fluid,  which could be a small little abscess as large as 1.7 cm and also some small  amount of free fluid.   IMPRESSION:  Postoperative infection/abscess with inflammatory phlegmon.  I  think this is probably more likely than Crohn's disease, although certainly  given the original surgeon's inspection operatively, this probably is  something that needs to be worked up further.  I think the fluid collections  may be too small to percutaneously drain.  She currently is better after  receiving IV fluid hydration and IV fluid antibiotics.   PLAN:  I would continue the IV antibiotics and bowel rest.  Would repeat her  CT scan in a few days.  Hopefully things will improve, but if she has more  fluid collection that is organizing, percutaneous drainage would be  warranted.      Odis Hollingshead, M.D.  Electronically Signed     TJR/MEDQ  D:  01/16/2005  T:  01/16/2005  Job:  [id number]   cc:   Milus Banister, M.D.

## 2010-10-13 NOTE — Discharge Summary (Signed)
NAMERENNEE, [name]                ACCOUNT NO.:  [id number]   MEDICAL RECORD NO.:  [id number]          PATIENT TYPE:  INP   LOCATION:  1508                         FACILITY:  [facility]   PHYSICIAN:  [name], M.D.   DATE OF BIRTH:  26-Jul-1980   DATE OF ADMISSION:  01/15/2005  DATE OF DISCHARGE:                                 DISCHARGE SUMMARY   DISCHARGE DIAGNOSES:  1.  Perforated appendicitis, status post appendectomy with postoperative      infection involving the terminal ileum and cecum with possible      microabscesses to same areas.  2.  Partial small bowel obstruction.  3.  Inflammatory bowel disease.  4.  Microcytic anemia.  5.  Hypokalemia.   DISCHARGE MEDICATIONS:  1.  Augmentin 875 mg p.o. b.i.d. x1 week.  2.  Pentasa 250 mg q.i.d.  3.  Calcium with vitamin D, zinc, and magnesium 500 mg daily.   CONSULTATIONS:  1.  Dr. Ardis Hughs of gastroenterology.  2.  Dr. Zella Richer of general surgery.   PROCEDURES/DIAGNOSTIC STUDIES:  1.  Acute abdominal series on January 15, 2005 showed mild ileus or partial      obstruction involving a small bowel loop in the left mid abdomen.  2.  CT scan of the abdomen and pelvis on January 15, 2005 showed a complex      inflammatory process in the right lower quadrant with wall thickening      involving the distal ileum and cecum.  Probable small loculi of      extraluminal gas and possible small abscesses in the right lower      quadrant.  Extensive inflammatory changes.  Mildly enlarged mesenteric      lymph nodes.  Changes were felt to be consistent with inflammatory or      infectious enteritis.  3.  Two views of the abdomen on January 17, 2005 showed interval improvement      and small bowel distention, compatible with an improving ileus.  No      extraluminal air was identified.  4.  Repeat CT scan of the abdomen and pelvis on January 19, 2005 showed      extensive inflammatory changes within the pelvis, largely unchanged to  slightly improved in the interval.  Loculated fluid collections persist,      which are slightly smaller on this study.  Moderate amount of free      pelvic fluid is stable.  5.  Repeat CT scanning on January 25, 2005 showed persistent wall thickening      inflammation involving the terminal ileum, suggesting Crohn's disease.      There is a small amount of fluid and inflammation around this area, but      it appears to be improved.  Two small focal fluid collections with some      thick rim-like enhancement appear slightly smaller and maybe resolving      small abscesses.  The uterus and ovaries appear normal.   DISCHARGE LABORATORY VALUES:  A CBC showed a white blood cell count of 9.2,  hemoglobin 10, hematocrit 31,  and platelets 518.  Sodium 136, potassium 3.9,  chloride 102, bicarb 28, BUN 2, creatinine 0.6, and glucose 95.  Calcium was  8.5.  Blood cultures were negative.   HISTORY OF PRESENT ILLNESS:  The patient is a 30 year old female who  presented to the hospital on postoperative day #18 from an open appendectomy  after having a ruptured appendicitis.  At the time of the surgical  intervention, there was a question of possible Crohn's disease.  Initial  testing was negative for this.  She was kept on antibiotics for  approximately one week postoperatively.  Over the week prior to her  admission, she developed increasing right lower quadrant pain accompanied by  nausea and constipation.  She also had some fever.  Upon initial evaluation  in the emergency department, she was found to have an elevated white blood  cell count and CT scanning revealed a complex inflammatory process, so she  was subsequently admitted and started on IV antibiotics.   HOSPITAL COURSE:  Problem 1:  Abdominal pain and fever:  The patient was  admitted and GI consultation was emergently obtained.  She was empirically  treated with IV Cipro and Flagyl.  It was felt that due to her leukocytosis,  fever, and  large amount of inflammation and fluid collection seen on the CT  scan, that a surgical consult would be prudent.  Dr. Zella Richer of surgery  saw the patient on hospital day #2.  She began to clinically improve on IV  antibiotics on hospital day #2; however, on hospital day #4, CT scanning did  not show any improvement, and the patient began to show evidence of  increasing white blood cell count.  Due to this, her antibiotics were  changed from Cipro and Flagyl to Primaxin.  She had dramatic improvement  with treatment with Primaxin.  She currently is on day #5 of Primaxin to  complete a course of 7 days, at which time she will be transitioned over to  p.o. Augmentin for an additional week.  The plan is for an outpatient  colonoscopy in two weeks time, once her infection is completely eradicated.  Problem 2:  Small bowel obstruction:  The patient's small bowel obstruction  resolved early in her hospital course, and she was able to gradually  progress her diet and is tolerating solid foods at the time of this  dictation.  Problem 3:  Hypokalemia:  Thought to be secondary to GI losses.  She was  appropriately repleted.  Problem 4:  Iron-deficiency anemia:  Likely secondary to her recent surgery  and ongoing inflammatory process.  Her hemoglobin remained stable, and she  was asymptomatic.   DISPOSITION:  The patient will be discharged home on January 28, 2005 if  she continues to show improvement.  She will follow up with Dr. Zella Richer  in 2-3 weeks.  She will follow up with Dr. Ardis Hughs in two weeks for a  colonoscopy.           ______________________________  [name], M.D.     CR/MEDQ  D:  01/26/2005  T:  01/26/2005  Job:  [id number]   cc:   Odis Hollingshead, M.D.  [address], [address]  [place]   [id number]   Milus Banister, M.D.

## 2010-11-08 ENCOUNTER — Encounter: Payer: Self-pay | Admitting: Internal Medicine

## 2010-11-08 ENCOUNTER — Ambulatory Visit (INDEPENDENT_AMBULATORY_CARE_PROVIDER_SITE_OTHER): Payer: BC Managed Care – PPO | Admitting: Internal Medicine

## 2010-11-08 ENCOUNTER — Ambulatory Visit (INDEPENDENT_AMBULATORY_CARE_PROVIDER_SITE_OTHER)
Admission: RE | Admit: 2010-11-08 | Discharge: 2010-11-08 | Disposition: A | Discharge status: Home / Self Care | Payer: BC Managed Care – PPO | Source: Ambulatory Visit | Attending: Internal Medicine | Admitting: Internal Medicine

## 2010-11-08 VITALS — BP 120/80 | HR 90 | Wt 220.0 lb

## 2010-11-08 DIAGNOSIS — M25532 Pain in left wrist: Secondary | ICD-10-CM

## 2010-11-08 DIAGNOSIS — M25539 Pain in unspecified wrist: Secondary | ICD-10-CM

## 2010-11-08 DIAGNOSIS — H6120 Impacted cerumen, unspecified ear: Secondary | ICD-10-CM

## 2010-11-08 NOTE — Assessment & Plan Note (Signed)
Bilateral ear canals successfully irrigated today

## 2010-11-08 NOTE — Assessment & Plan Note (Signed)
Status post fall and does not appear to be related to underlying Crohn's disease. Avoid anti-inflammatory with Crohn's disease. Obtain left wrist radiograph.

## 2010-11-08 NOTE — Progress Notes (Signed)
  Subjective:    Patient ID: [name], female    DOB: [birth year], 30 y.o.   MRN: [id number]  HPI Pt presents to clinic for evaluation of wrist pain and decreased hearing. Meds have axonal fall approximately one month ago striking her left side. Presented to urgent care with pain including wrist pain and was told to represent a tendinitis. No x-rays performed. Pain is persisted with certain movements but is mild. Does have full range of motion intact. Is not attempting a medication for this problem. No other alleviating or exacerbating factors. Also notes a clock sensation of left ear without drainage pain fever or chills. Attempted over-the-counter ear cleaning drops without improvement. No other complaints.  Reviewed past medical history, medications and allergies.  Review of Systems see history of present illness     Objective:   Physical Exam  Nursing note and vitals reviewed. Constitutional: She appears well-developed and well-nourished. No distress.  HENT:  Head: Normocephalic and atraumatic.  Right Ear: External ear normal.  Left Ear: External ear normal.  Nose: Nose normal.       Bilateral canals with cerumen blocking  Eyes: Conjunctivae are normal. No scleral icterus.  Musculoskeletal:       Left wrist without erythema warmth or effusion. No bony abnormality. Nontender. Sensation grossly intact. Full range of motion noted.  Neurological: She is alert.  Skin: Skin is warm and dry. No rash noted. She is not diaphoretic. No erythema.  Psychiatric: She has a normal mood and affect.          Assessment & Plan:

## 2010-11-09 ENCOUNTER — Telehealth: Payer: Self-pay

## 2010-11-09 ENCOUNTER — Other Ambulatory Visit (INDEPENDENT_AMBULATORY_CARE_PROVIDER_SITE_OTHER): Payer: Self-pay | Admitting: General Surgery

## 2010-11-09 ENCOUNTER — Ambulatory Visit (HOSPITAL_COMMUNITY)
Admission: RE | Admit: 2010-11-09 | Discharge: 2010-11-09 | Disposition: A | Discharge status: Home / Self Care | Payer: BC Managed Care – PPO | Source: Ambulatory Visit | Attending: General Surgery | Admitting: General Surgery

## 2010-11-09 ENCOUNTER — Encounter (HOSPITAL_COMMUNITY): Payer: BC Managed Care – PPO

## 2010-11-09 DIAGNOSIS — Z8701 Personal history of pneumonia (recurrent): Secondary | ICD-10-CM | POA: Insufficient documentation

## 2010-11-09 DIAGNOSIS — K432 Incisional hernia without obstruction or gangrene: Secondary | ICD-10-CM

## 2010-11-09 DIAGNOSIS — Z01818 Encounter for other preprocedural examination: Secondary | ICD-10-CM | POA: Insufficient documentation

## 2010-11-09 DIAGNOSIS — Z01812 Encounter for preprocedural laboratory examination: Secondary | ICD-10-CM | POA: Insufficient documentation

## 2010-11-09 LAB — DIFFERENTIAL
Basophils Absolute: 0 10*3/uL (ref 0.0–0.1)
Basophils Relative: 0 % (ref 0–1)
Neutro Abs: 4.6 10*3/uL (ref 1.7–7.7)
Neutrophils Relative %: 58 % (ref 43–77)

## 2010-11-09 LAB — BASIC METABOLIC PANEL
BUN: 11 mg/dL (ref 6–23)
GFR calc Af Amer: >60 mL/min (ref >60)
GFR calc non Af Amer: >60 mL/min (ref >60)
Potassium: 4.1 mEq/L (ref 3.5–5.1)
Sodium: 136 mEq/L (ref 135–145)

## 2010-11-09 LAB — SURGICAL PCR SCREEN: MRSA, PCR: NEGATIVE

## 2010-11-09 LAB — CBC
MCHC: 31.5 g/dL (ref 30.0–36.0)
Platelets: 329 10*3/uL (ref 150–400)
RDW: 14.2 % (ref 11.5–15.5)

## 2010-11-09 LAB — HCG, SERUM, QUALITATIVE: Preg, Serum: NEGATIVE

## 2010-11-09 NOTE — Telephone Encounter (Signed)
Message copied by Santiago Bumpers on Thu Nov 09, 2010  9:40 AM ------      Message from: Anise Salvo      Created: Thu Nov 09, 2010  8:05 AM       Wrist xray nl

## 2010-11-09 NOTE — Telephone Encounter (Signed)
Pt.notified

## 2010-11-09 NOTE — Telephone Encounter (Signed)
Left message for pt to call back for x-ray results

## 2010-11-09 NOTE — Telephone Encounter (Signed)
Message copied by Santiago Bumpers on Thu Nov 09, 2010 10:47 AM ------      Message from: Anise Salvo      Created: Thu Nov 09, 2010  8:05 AM       Wrist xray nl

## 2010-11-13 ENCOUNTER — Encounter (INDEPENDENT_AMBULATORY_CARE_PROVIDER_SITE_OTHER): Payer: Self-pay | Admitting: General Surgery

## 2010-11-13 ENCOUNTER — Telehealth: Payer: Self-pay | Admitting: Gastroenterology

## 2010-11-13 NOTE — Telephone Encounter (Signed)
ok 

## 2010-11-13 NOTE — Telephone Encounter (Signed)
FYI for Dr Ardis Hughs

## 2010-11-22 ENCOUNTER — Inpatient Hospital Stay (HOSPITAL_COMMUNITY): Admission: RE | Admit: 2010-11-22 | Payer: Self-pay | Source: Ambulatory Visit | Admitting: General Surgery

## 2010-11-22 ENCOUNTER — Other Ambulatory Visit (INDEPENDENT_AMBULATORY_CARE_PROVIDER_SITE_OTHER): Payer: Self-pay | Admitting: General Surgery

## 2010-11-22 ENCOUNTER — Ambulatory Visit (HOSPITAL_COMMUNITY)
Admission: RE | Admit: 2010-11-22 | Discharge: 2010-11-22 | Disposition: A | Discharge status: Home / Self Care | Payer: BC Managed Care – PPO | Source: Ambulatory Visit | Attending: General Surgery | Admitting: General Surgery

## 2010-11-22 DIAGNOSIS — K5090 Crohn's disease, unspecified, without complications: Secondary | ICD-10-CM | POA: Insufficient documentation

## 2010-11-22 DIAGNOSIS — L0591 Pilonidal cyst without abscess: Secondary | ICD-10-CM

## 2010-11-22 DIAGNOSIS — L0231 Cutaneous abscess of buttock: Secondary | ICD-10-CM | POA: Insufficient documentation

## 2010-11-22 DIAGNOSIS — S31809A Unspecified open wound of unspecified buttock, initial encounter: Secondary | ICD-10-CM | POA: Insufficient documentation

## 2010-11-22 DIAGNOSIS — X58XXXA Exposure to other specified factors, initial encounter: Secondary | ICD-10-CM | POA: Insufficient documentation

## 2010-11-22 DIAGNOSIS — K469 Unspecified abdominal hernia without obstruction or gangrene: Secondary | ICD-10-CM | POA: Insufficient documentation

## 2010-11-23 ENCOUNTER — Telehealth (INDEPENDENT_AMBULATORY_CARE_PROVIDER_SITE_OTHER): Payer: Self-pay

## 2010-11-23 NOTE — Telephone Encounter (Signed)
Pt called to ask if she has bowel movement how should she clean herself. Pt instructed to use wet wipes with gentle cleansing. Pt wants to know if she can change bandage if it becomes soiled. Pt advised to use the gauze given to here at discharge and change bandage if needed. Pt to call with concerns.

## 2010-11-24 ENCOUNTER — Encounter (INDEPENDENT_AMBULATORY_CARE_PROVIDER_SITE_OTHER): Payer: BC Managed Care – PPO

## 2010-11-30 ENCOUNTER — Encounter (INDEPENDENT_AMBULATORY_CARE_PROVIDER_SITE_OTHER): Payer: BC Managed Care – PPO | Admitting: General Surgery

## 2010-12-05 NOTE — Op Note (Signed)
Donna, [name] NO.:  [id number]  MEDICAL RECORD NO.:  [id number]  LOCATION:  DAYL                         FACILITY:  [facility]  PHYSICIAN:  Mare Loan, MD      DATE OF BIRTH:  May 02, 1981  DATE OF PROCEDURE:  11/22/2010 DATE OF DISCHARGE:                              OPERATIVE REPORT   PREOPERATIVE DIAGNOSIS:  Questionable pilonidal cyst, chronic abscess cavity.  POSTOPERATIVE DIAGNOSIS:  Chronic abscess cavity, question of cutaneous Crohn's.  SURGEON:  Leighana Neyman L. Zenia Resides, M.D.  ASSISTANT:  OR staff.  ESTIMATED BLOOD LOSS:  Minimal.  ANESTHESIA:  General endotracheal.  SPECIMEN:  Chronic abscess cavity.  FINDINGS:  Superficial cavity.  No deeper cavity was found.  DISPOSITION:  The patient to PACU in stable condition.  INDICATION FOR PROCEDURE:  [name] is a 30 year old female who I had previously done ileocecectomy for Crohn disease.  She had been doing well and actually developed a hernia, which I was going to repair.  She had been seen in the office by Dr. Brantley Stage and actually had incision and drainage what was felt to be a pilonidal cyst.  She was on schedule this week for repair of a hernia, but I had seen her last week because she began to have increasing pain around the gluteal cleft area.  I saw her last week in the office and once again, we found evidence of an abscess. Incision and drainage was performed.  We discussed removing the cavity versus repair of her hernia.  I told her that I did not think it was possible to see both at the same time given the infection risk. Therefore, we decided to proceed with excision of the cavity.  DETAILS OF PROCEDURE:  [name] was identified in the preoperative holding area and received IV antibiotics.  She was seen by Anesthesia and taken to the operating suite.  In the operating suite, placed in supine position.  Sequential compression devices were applied to lower extremity.  She was placed  under general endotracheal anesthesia.  She was then placed in prone position.  The gluteal cleft area was prepped and draped in usual sterile fashion.  Surgical time-out was performed. I began by drying an incision around the old cavity site.  Using a 15 blade, excised the skin around the cavity site.  I did not find any deeper connection using a hemostat.  I therefore used electrocautery to remove en bloc the cavity and the surrounding tissue.  The specimen was passed off the operative field.  I irrigated the wound bed and found minimal amount of oozing which was controlled with electrocautery.  I irrigated the wound bed with copies amounts of saline.  I then reapproximated in layers with a 3-0 Vicryl and 2-0 Vicryl.  I closed the dermis with a 2-0 Vicryl.  I then closed with nylon with interrupted 2-0 nylon suture.  I placed an iodoform packing inferiorly.  I then placed dry gauze and Tegaderm for final dressing.  The patient was awoken and placed in supine position, extubated, transferred to post anesthesia care unit.  She will be discharged home with Percocet.  Follow up this Friday for  wound check and removal of packing.  I will likely have her follow up with Dr. Johney Maine next week for another wound check, possible removal of some of the sutures.  I have instructed that if she may have some drainage, hopefully this will not recur.  I am going to probably have her follow up with Dr. Johney Maine for her hernia.  She is on Remicade for her Crohn's disease and has been treated successfully for the abdominal portion.  She will continue to follow Dr. Ardis Hughs as needed.     Mare Loan, MD     ALA/MEDQ  D:  11/22/2010  T:  11/22/2010  Job:  [id number]  cc:   Milus Banister, MD [address] [place], [place] [address]  [place] Regis Bill, MD [place] [facility] [place] [place] [address]  Electronically Signed by Ronnald Collum MD on 12/05/2010 11:42:33 AM

## 2010-12-08 ENCOUNTER — Ambulatory Visit (INDEPENDENT_AMBULATORY_CARE_PROVIDER_SITE_OTHER): Payer: BC Managed Care – PPO | Admitting: General Surgery

## 2010-12-08 VITALS — Temp 96.8°F

## 2010-12-08 DIAGNOSIS — Z9889 Other specified postprocedural states: Secondary | ICD-10-CM

## 2010-12-08 NOTE — Progress Notes (Signed)
Patient returns approximately 3 weeks following excision of a pilonidal cyst by Dr. Zenia Resides. She reports no particular problems.  On examination the wound is very nicely healed. She had 3 remaining sutures which are removed.  Of note is the patient has a known incisional hernia. She would like to have this repaired but wants to wait until next summer. I asked her to call the office a few months ahead of time when she would like to look into having this repaired.  For the current problems she will return as needed.

## 2010-12-08 NOTE — Patient Instructions (Signed)
Call as needed for any problems with the incision.  Call for appointment when you would like to address having your hernia repair

## 2011-01-10 ENCOUNTER — Encounter: Payer: Self-pay | Admitting: Gastroenterology

## 2011-01-10 ENCOUNTER — Ambulatory Visit (INDEPENDENT_AMBULATORY_CARE_PROVIDER_SITE_OTHER): Payer: BC Managed Care – PPO | Admitting: Gastroenterology

## 2011-01-10 VITALS — BP 112/82 | HR 88 | Ht 63.0 in | Wt 227.0 lb

## 2011-01-10 DIAGNOSIS — K5090 Crohn's disease, unspecified, without complications: Secondary | ICD-10-CM

## 2011-01-10 NOTE — Patient Instructions (Signed)
Return to see Dr. Ardis Hughs in 3-4 months, sooner if needed. Continue remicade every 8 weeks. A copy of this information will be made available to Dr. Excell Seltzer.

## 2011-01-10 NOTE — Progress Notes (Signed)
Review of gastrointestinal problems:  1. Crohn ileitis. Presented like an acute appendicitis 2006. Surgery, not in [place], was much more suggestive of Crohn's disease. She did not respond to immunomodulators and so eventually she Began Remicade April 2007 While continuing on azathioprine. Early 2008, stopped azathioprine and she tolerated solo maintenance Remicade (5 mg per kilogram) well. April 2009 continues to tolerate Remicade infusions very well, getting her infusions at Dr. Tanna Furry office. 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 Her Remicade dosing was increased to 10 mg per kilogram. Winter, 2010 presented with right lower quadrant abscess, clear fistula connection between small bowel and the abscess. 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 Infection was drained with interventional radiology, drain eventually pulled however abscess recurred within days and so right lower quadrant drain was replaced. January, 2011: she is on azathioprine 100 mg a day, Augmentin twice daily, planning for surgery late January. January, 2011: ileocecectomy performed for active disease, persistent fistula, abscess. April, 2012 doing well on Remicade every 8 weeks. 2. May, 2011: polynidol cyst drained by urgent care; drained another time 2011. eventually surgically resected spring 2012   HPI: This is a  very pleasant 30 year old woman whom I last saw about 4 months ago.  She is feeling fine.  She was considering hernia repair but postponed due to polynidol cyst issue again, finally surgically removed. No bleeding.  Minor perihernial discmfort.  No nausea or vomiting.  Her last remicade was late July.        Review of systems: Pertinent positive and negative review of systems were noted in the above HPI section.  All other review of systems was otherwise negative.   Past  Medical History  Diagnosis Date  . Chronic rhinitis   . Acute swimmers' ear   . Contact dermatitis and other eczema, due to unspecified cause   . Regional enteritis of small intestine   . Muscle abscess   . Rectal abscess   . Mononucleosis   . Obesity   . Pneumonia   . Pilonidal abscess   . Fibroids   . Crohn's     Past Surgical History  Procedure Date  . Knee arthroscopy 99, 02  . Appendectomy 7/06  . Ileocecal resection 1/11    crohns     reports that she has never smoked. She does not have any smokeless tobacco history on file. She reports that she does not drink alcohol or use illicit drugs.  family history includes Aneurysm in her mother; Bipolar disorder in her sister; Colon cancer in her maternal grandmother; Diabetes in her sister; Multiple sclerosis in her sister; and Stomach cancer in her paternal grandfather.    Current Medications, Allergies were all reviewed with the patient via [facility] record system.    Physical Exam: BP 112/82  Pulse 88  Ht 5' 3"  (1.6 m)  Wt 227 lb (102.967 kg)  BMI 40.21 kg/m2  LMP 01/04/2011 Constitutional: generally well-appearing Psychiatric: alert and oriented x3 Site of pilonidal cyst removal looked normal    Assessment and plan: 30 y.o. female with Crohn's disease, doing well currently  She will continue on Remicade every 8 weeks she'll return to see me in 3-4 months and sooner if needed.

## 2011-03-05 ENCOUNTER — Telehealth: Payer: Self-pay | Admitting: Internal Medicine

## 2011-03-05 ENCOUNTER — Encounter: Payer: Self-pay | Admitting: Internal Medicine

## 2011-03-05 NOTE — Telephone Encounter (Signed)
Pt req a work in cpx before end of the year. Pls advise.

## 2011-03-06 ENCOUNTER — Ambulatory Visit (INDEPENDENT_AMBULATORY_CARE_PROVIDER_SITE_OTHER): Payer: BC Managed Care – PPO | Admitting: Internal Medicine

## 2011-03-06 ENCOUNTER — Encounter: Payer: Self-pay | Admitting: Internal Medicine

## 2011-03-06 VITALS — BP 120/80 | HR 78 | Temp 98.7°F | Wt 230.0 lb

## 2011-03-06 DIAGNOSIS — N398 Other specified disorders of urinary system: Secondary | ICD-10-CM

## 2011-03-06 DIAGNOSIS — H6090 Unspecified otitis externa, unspecified ear: Secondary | ICD-10-CM

## 2011-03-06 DIAGNOSIS — IMO0002 Reserved for concepts with insufficient information to code with codable children: Secondary | ICD-10-CM

## 2011-03-06 DIAGNOSIS — M549 Dorsalgia, unspecified: Secondary | ICD-10-CM | POA: Insufficient documentation

## 2011-03-06 DIAGNOSIS — H60399 Other infective otitis externa, unspecified ear: Secondary | ICD-10-CM

## 2011-03-06 DIAGNOSIS — K5090 Crohn's disease, unspecified, without complications: Secondary | ICD-10-CM

## 2011-03-06 LAB — POCT URINALYSIS DIPSTICK
Bilirubin, UA: NEGATIVE
Glucose, UA: NEGATIVE
Ketones, UA: NEGATIVE
Spec Grav, UA: 1.015

## 2011-03-06 NOTE — Progress Notes (Signed)
  Subjective:    Patient ID: [name], female    DOB: 03/29/1981, 30 y.o.   MRN: [id number]  HPI Patient comes in today for SDA  For acute problem evaluation. However this has been going on a few weeks.  Martin Majestic to urgent care for Had ear wax removed and had cloudy  Urine so urine was checked   And was given 5 days of antibiotic  . Apparently ucx was clear  This was about  2 weeks.  Felt" better"  But doesn't feel drinking enough water. So increased intake at night  . Has been   Sore in  Low back back and hard to urinate with urgency at times in am ;no numbness or weakness of LE  No constipation.  Or change in stool No fever . Radiation in legs although poss pain on right more than left.  Crohns no change; had remicade 3 weeks ago?  Due in Novemnber Period started 3-4 days ago.  ? Change in bladder habits  With urgency in the middle of night  More but drinking a lot more water  In the evening. No sodas or sig etoh.   Review of Systems NO fever cp sob  Ears clogged and hurt and  Using ear drops prn not qid getting better but still an issue. Had abd hernia not yet repaired but no pain from this.  Feels malaise sometimes  Unsure of sx are important had pilonidal cyst removed this summer .    Past history family history social history reviewed in the electronic medical record.  Outpatient Encounter Prescriptions as of 03/06/2011  Medication Sig Dispense Refill  . Calcium Carbonate-Vit D-Min (CALTRATE PLUS PO) Take by mouth daily.        . drospirenone-ethinyl estradiol (YAZ,GIANVI,LORYNA) 3-0.02 MG tablet Take 1 tablet by mouth daily.        [name] inFLIXimab (REMICADE) 100 MG injection Inject 10 mg/kg into the vein every 8 (eight) weeks.        . Multiple Vitamin (MULTIVITAMIN) capsule Take 1 capsule by mouth daily.        [name] DISCONTD: Eszopiclone (ESZOPICLONE) 3 MG TABS Take 3 mg by mouth at bedtime. Take immediately before bedtime       . DISCONTD: Norgestimate-Ethinyl Estradiol Triphasic  (TRI-SPRINTEC) 0.18/0.215/0.25 MG-35 MCG tablet Take 1 tablet by mouth daily.           Objective:   Physical Exam wdwn in nad  HEENT: Normocephalic ;atraumatic , Eyes;  PERRL, EOMs  Full, lids and conjunctiva clear,,Ears: no deformities, canals  Whitish ear drops  In canals tm seen on left not right, Nose: no deformity or discharge  Mouth : OP clear without lesion or edema .  Mild erythema no exudate Neck no masses  Abdomen:  Sof,t normal bowel sounds without hepatosplenomegaly, no guarding rebound or masses no CVA tenderness Back somewhat lorsdosis Points to area of ls si area no midline tenderness and no flank pain. Some pain when lifting right leg no gross weakness Skin no bruising or bleeding     Assessment & Plan:  LBP ? If mechanical vs other   On menses now  Nl otherwise   UA clear except the blood. Sx not typical of renal stones

## 2011-03-06 NOTE — Patient Instructions (Signed)
The back pain could be mechanical back pain.  The urine test is normal except the blood from probably  Period. Get x ray.and will let you know results. Use tylenol as needed. For now If the pain is not getting better after period  ( another 1-2 weeks) or getting worse  Advise reevaluation. return office visit.

## 2011-03-07 ENCOUNTER — Encounter: Payer: Self-pay | Admitting: Internal Medicine

## 2011-03-07 DIAGNOSIS — N398 Other specified disorders of urinary system: Secondary | ICD-10-CM | POA: Insufficient documentation

## 2011-03-07 DIAGNOSIS — H6090 Unspecified otitis externa, unspecified ear: Secondary | ICD-10-CM | POA: Insufficient documentation

## 2011-03-07 LAB — COMPREHENSIVE METABOLIC PANEL
ALT: 14
AST: 21
Albumin: 3.3 — ABNORMAL LOW
Calcium: 9.2
Chloride: 105
Creatinine, Ser: 0.64
GFR calc Af Amer: >60
Sodium: 139
Total Bilirubin: 0.7

## 2011-03-07 LAB — CBC
MCV: 90.5
Platelets: 289
WBC: 9.5

## 2011-03-07 NOTE — Assessment & Plan Note (Signed)
Monitor as mechanical back pain and close follow up  X ray pending.

## 2011-03-08 ENCOUNTER — Ambulatory Visit (INDEPENDENT_AMBULATORY_CARE_PROVIDER_SITE_OTHER)
Admission: RE | Admit: 2011-03-08 | Discharge: 2011-03-08 | Disposition: A | Discharge status: Home / Self Care | Payer: BC Managed Care – PPO | Source: Ambulatory Visit | Attending: Internal Medicine | Admitting: Internal Medicine

## 2011-03-08 DIAGNOSIS — K5090 Crohn's disease, unspecified, without complications: Secondary | ICD-10-CM

## 2011-03-08 DIAGNOSIS — M549 Dorsalgia, unspecified: Secondary | ICD-10-CM

## 2011-03-09 ENCOUNTER — Telehealth: Payer: Self-pay | Admitting: *Deleted

## 2011-03-09 DIAGNOSIS — M549 Dorsalgia, unspecified: Secondary | ICD-10-CM

## 2011-03-09 DIAGNOSIS — Z0000 Encounter for general adult medical examination without abnormal findings: Secondary | ICD-10-CM

## 2011-03-09 NOTE — Telephone Encounter (Signed)
See result note with back x ray

## 2011-03-09 NOTE — Telephone Encounter (Signed)
Left message for pt to call back  °

## 2011-03-09 NOTE — Telephone Encounter (Signed)
Message copied by Sherron Monday on Fri Mar 09, 2011  1:23 PM ------      Message from: [facility], [place]      Created: Thu Mar 08, 2011  8:35 PM       Tell patient that  X ray shows no acute or abnormal finding.       Can do CPX in afternoon October 10 30 for 30 minutes .      We can do labs that day or ahead of time.   But Add hla b27 , crp ,  To her cpx labs dx back pain.

## 2011-03-12 LAB — CBC
HCT: 38.5
MCV: 91.2
Platelets: 262
RDW: 12.8

## 2011-03-12 LAB — BASIC METABOLIC PANEL
BUN: 9
Chloride: 106
Glucose, Bld: 67 — ABNORMAL LOW
Potassium: 3.9

## 2011-03-12 NOTE — Telephone Encounter (Signed)
Left message for pt to call back  °

## 2011-03-14 LAB — CBC
Platelets: 279
RDW: 13.5

## 2011-03-14 LAB — COMPREHENSIVE METABOLIC PANEL
ALT: 16
Albumin: 2.9 — ABNORMAL LOW
Alkaline Phosphatase: 72
GFR calc Af Amer: >60
Potassium: 3.9
Sodium: 141
Total Protein: 5.8 — ABNORMAL LOW

## 2011-03-15 NOTE — Telephone Encounter (Signed)
Pt aware and cpx appt made.

## 2011-03-19 ENCOUNTER — Other Ambulatory Visit (INDEPENDENT_AMBULATORY_CARE_PROVIDER_SITE_OTHER): Payer: BC Managed Care – PPO

## 2011-03-19 DIAGNOSIS — Z0000 Encounter for general adult medical examination without abnormal findings: Secondary | ICD-10-CM

## 2011-03-19 DIAGNOSIS — M549 Dorsalgia, unspecified: Secondary | ICD-10-CM

## 2011-03-19 LAB — BASIC METABOLIC PANEL
BUN: 10 mg/dL (ref 6–23)
Calcium: 8.3 mg/dL — ABNORMAL LOW (ref 8.4–10.5)
GFR: 111.49 mL/min (ref >60.00)
Glucose, Bld: 102 mg/dL — ABNORMAL HIGH (ref 70–99)
Potassium: 4 mEq/L (ref 3.5–5.1)

## 2011-03-19 LAB — LIPID PANEL
Cholesterol: 149 mg/dL (ref 0–200)
HDL: 67.8 mg/dL (ref >39.00)
Triglycerides: 53 mg/dL (ref 0.0–149.0)
VLDL: 10.6 mg/dL (ref 0.0–40.0)

## 2011-03-19 LAB — CBC WITH DIFFERENTIAL/PLATELET
Basophils Absolute: 0 10*3/uL (ref 0.0–0.1)
Hemoglobin: 12.3 g/dL (ref 12.0–15.0)
Lymphocytes Relative: 28.7 % (ref 12.0–46.0)
Monocytes Relative: 7.8 % (ref 3.0–12.0)
Neutro Abs: 5.4 10*3/uL (ref 1.4–7.7)
Neutrophils Relative %: 62.1 % (ref 43.0–77.0)
RDW: 15.7 % — ABNORMAL HIGH (ref 11.5–14.6)

## 2011-03-19 LAB — URINALYSIS
Bilirubin Urine: NEGATIVE
Hgb urine dipstick: NEGATIVE
Ketones, ur: NEGATIVE
Total Protein, Urine: NEGATIVE
Urine Glucose: NEGATIVE

## 2011-03-19 LAB — HEPATIC FUNCTION PANEL
Albumin: 3 g/dL — ABNORMAL LOW (ref 3.5–5.2)
Alkaline Phosphatase: 52 U/L (ref 39–117)
Total Protein: 6.9 g/dL (ref 6.0–8.3)

## 2011-03-28 ENCOUNTER — Encounter: Payer: Self-pay | Admitting: Internal Medicine

## 2011-03-28 ENCOUNTER — Ambulatory Visit (INDEPENDENT_AMBULATORY_CARE_PROVIDER_SITE_OTHER): Payer: BC Managed Care – PPO | Admitting: Internal Medicine

## 2011-03-28 VITALS — BP 120/60 | HR 121 | Ht 62.5 in | Wt 234.0 lb

## 2011-03-28 DIAGNOSIS — K5090 Crohn's disease, unspecified, without complications: Secondary | ICD-10-CM

## 2011-03-28 DIAGNOSIS — G4710 Hypersomnia, unspecified: Secondary | ICD-10-CM

## 2011-03-28 DIAGNOSIS — Z0000 Encounter for general adult medical examination without abnormal findings: Secondary | ICD-10-CM

## 2011-03-28 DIAGNOSIS — M549 Dorsalgia, unspecified: Secondary | ICD-10-CM

## 2011-03-28 DIAGNOSIS — E8809 Other disorders of plasma-protein metabolism, not elsewhere classified: Secondary | ICD-10-CM

## 2011-03-28 DIAGNOSIS — E669 Obesity, unspecified: Secondary | ICD-10-CM

## 2011-03-28 DIAGNOSIS — K6289 Other specified diseases of anus and rectum: Secondary | ICD-10-CM

## 2011-03-28 NOTE — Patient Instructions (Signed)
If  Your pain and loose stool does not improve  I want you to see Dr Ardis Hughs or GI department.  Also will send them a copy of you labs . If  doing better  Then check up in a year.  Healthy weight loss would help possibly back and knee joints.

## 2011-03-28 NOTE — Progress Notes (Signed)
Subjective:    Patient ID: [name], female    DOB: 10-Jan-1981, 30 y.o.   MRN: [id number]  HPI Patient comes in today for preventive visit and follow-up of medical issues. Update of her history since her last visit.  LBP some better no fever  No uti sx  Last night  Had rectal pain after hard bm  Then loose stool and pain ful area to sit thinks she has a hemorrhoid or fissure no fever or sig abd carmping  Didn't sleep much last night  Ears ok  Irritative get ear nfection   No fever uri now  Sees gyne for fibroid  Gi on remicade  Due in about 2 weeks  Outpatient Encounter Prescriptions as of 03/28/2011  Medication Sig Dispense Refill  . Calcium Carbonate-Vit D-Min (CALTRATE PLUS PO) Take by mouth daily.        . drospirenone-ethinyl estradiol (YAZ,GIANVI,LORYNA) 3-0.02 MG tablet Take 1 tablet by mouth daily.        [name] inFLIXimab (REMICADE) 100 MG injection Inject 10 mg/kg into the vein every 8 (eight) weeks.        [name] loratadine (CLARITIN) 10 MG tablet Take 10 mg by mouth daily.        . Multiple Vitamin (MULTIVITAMIN) capsule Take 1 capsule by mouth daily.            Review of Systems ROS:  GEN/ HEENTNo fever, significant weight changes sweats headaches vision problems hearing changes, CV/ PULM; No chest pain shortness of breath cough, syncope,edema  change in exercise tolerance. GI /GU: No adominal pain, vomiting, . No blood in the stool. No significant GU symptoms. Has fibroids and has had lupron for this SKIN/HEME: ,no acute skin rashes suspicious lesions or bleeding. No lymphadenopathy, nodules, masses.  NEURO/ PSYCH:  No neurologic signs such as weakness numbness No depression anxiety. IMM/ Allergy: No unusual infections.  Allergy .   REST of 12 system review negative  Past Medical History  Diagnosis Date  . Chronic rhinitis   . Acute swimmers' ear   . Contact dermatitis and other eczema, due to unspecified cause   . Regional enteritis of small intestine   . Muscle  abscess   . Rectal abscess   . Mononucleosis   . Obesity   . Pneumonia   . Pilonidal abscess   . Fibroids   . Crohn's    Past Surgical History  Procedure Date  . Knee arthroscopy 99, 02  . Appendectomy 7/06  . Ileocecal resection 1/11    crohns  . Pilonidal cyst excision     summer 2012    reports that she has never smoked. She does not have any smokeless tobacco history on file. She reports that she does not drink alcohol or use illicit drugs. family history includes Aneurysm in her mother; Bipolar disorder in her sister; Colon cancer in her maternal grandmother; Diabetes in her sister; Healthy in her father; Multiple sclerosis in her sister; and Stomach cancer in her paternal grandfather. No Known Allergies     Objective:   Physical Exam Physical Exam: Vital signs reviewed SHF:WYOV is a well-developed well-nourished alert cooperative  aafemale who appears her stated age in no acute distress.  HEENT: normocephalic  traumatic , Eyes: PERRL EOM's full, conjunctiva clear, Nares: paten,t no deformity discharge or tenderness., Ears: no deformity EAC's clear TMs with normal landmarks. Mouth: clear OP, no lesions, edema. Tonsils 1 + good airway.  Moist mucous membranes. Dentition in adequate repair. NECK: supple without  masses, thyromegaly or bruits. CHEST/PULM:  Clear to auscultation and percussion breath sounds equal no wheeze , rales or rhonchi. No chest wall deformities or tenderness. CV: PMI is nondisplaced, S1 S2 no gallops, murmurs, rubs. Peripheral pulses are full without delay.No JVD .  ABDOMEN: Bowel sounds normal nontender  No guard or rebound, no hepato splenomegal no CVA tenderness.  No hernia. Extremtities:  No clubbing cyanosis or edema, no acute joint swelling or redness no focal atrophy NEURO:  Oriented x3, cranial nerves 3-12 appear to be intact, no obvious focal weakness,gait within normal limits no abnormal reflexes or asymmetrical SKIN: No acute rashes normal turgor,  color, no bruising or petechiae. PSYCH: Oriented, good eye contact, no obvious depression anxiety, cognition and judgment appear normal. Ext GU nl  Rectal :no sig erythema no obv fissure   No mass seenRectal very tender ? No mass' clay colored dc  On glove  No blood   Lab Results  Component Value Date   WBC 8.7 03/19/2011   HGB 12.3 03/19/2011   HCT 37.0 03/19/2011   PLT 313.0 03/19/2011   GLUCOSE 102* 03/19/2011   CHOL 149 03/19/2011   TRIG 53.0 03/19/2011   HDL 67.80 03/19/2011   LDLCALC 71 03/19/2011   ALT 10 03/19/2011   AST 16 03/19/2011   NA 138 03/19/2011   K 4.0 03/19/2011   CL 108 03/19/2011   CREATININE 0.8 03/19/2011   BUN 10 03/19/2011   CO2 25 03/19/2011   TSH 1.42 03/19/2011   INR 1.47 05/26/2009       Assessment & Plan:  Preventive Health Care Counseled regarding healthy nutrition, exercise, sleep, injury prevention, calcium vit d and healthy weight . Flu shot   Due for remicade in 2 weeks or less Rectal pain  Very severe and hard to do a rectal exam  Pt thinks its fissure no fever abd pain with this. However she should see gi if not getting better.  Obesity Counseled.  Very busy   With current job and medial issues  Counseled.   Strategies discussed  Crohn's on remicade    Per gi  Labs nl except low albumin     Urine clear   No protein. LBP better prob mechanical Fibroids  Hx of lupron rx  ? Not that helpful.

## 2011-03-29 DIAGNOSIS — K6289 Other specified diseases of anus and rectum: Secondary | ICD-10-CM | POA: Insufficient documentation

## 2011-03-29 DIAGNOSIS — E8809 Other disorders of plasma-protein metabolism, not elsewhere classified: Secondary | ICD-10-CM | POA: Insufficient documentation

## 2011-03-29 NOTE — Assessment & Plan Note (Signed)
No fever  Onset after a hard stool  Unsure dx  At risk for infection

## 2011-03-29 NOTE — Assessment & Plan Note (Signed)
Improved poss mechanical labs urevealing

## 2011-03-30 ENCOUNTER — Telehealth: Payer: Self-pay

## 2011-03-30 NOTE — Telephone Encounter (Signed)
Message copied by Barron Alvine on Fri Mar 30, 2011  8:53 AM ------      Message from: Milus Banister      Created: Fri Mar 30, 2011  7:18 AM       Hey,      She was overall doing pretty well at my last visit.  Rectal pain could be perianal crohn's.  I'm not sure about her alb, it was definitely lower about a year ago (several checks).  Could be from chronic GI disease.             I'll have my office get in touch with her to get her back in to see me soon.            Thanks            dj                  Lavera Vandermeer,      Can you call Kammie, I'd like to see her in next 2-3 weeks, sooner if rectal pain worsens before then.            Thanks            ----- Message -----         From: Lottie Dawson         Sent: 03/29/2011   9:03 PM           To: Owens Loffler, MD            Linna Hoff;      Sending you note       Told her to  See you if rectal pain continues. Unsure about dx. Seems sever for hx.             Also,do you think her low albumin is from her gi status loss?  Her urine is neg for protein.  Do we need to do more evaluation for this?            Thanks       wanda

## 2011-03-30 NOTE — Telephone Encounter (Signed)
Left message on machine to call back  

## 2011-04-02 NOTE — Telephone Encounter (Signed)
Left message on machine to call back  I will send a letter to her home.

## 2011-04-09 ENCOUNTER — Ambulatory Visit (INDEPENDENT_AMBULATORY_CARE_PROVIDER_SITE_OTHER): Payer: BC Managed Care – PPO | Admitting: Gastroenterology

## 2011-04-09 ENCOUNTER — Encounter: Payer: Self-pay | Admitting: Gastroenterology

## 2011-04-09 VITALS — BP 96/50 | HR 64 | Ht 62.0 in | Wt 231.0 lb

## 2011-04-09 DIAGNOSIS — K5090 Crohn's disease, unspecified, without complications: Secondary | ICD-10-CM

## 2011-04-09 NOTE — Patient Instructions (Signed)
If you have recurrent anal discomforts, call to be seen by Dr. Ardis Hughs office during the pain. Return to see Dr. Ardis Hughs in 4 months, sooner if needed. A copy of this information will be made available to Dr. Regis Bill.

## 2011-04-09 NOTE — Progress Notes (Signed)
Review of gastrointestinal problems:  1. Crohn ileitis. Presented like an acute appendicitis 2006. Surgery, not in [place], was much more suggestive of Crohn's disease. She did not respond to immunomodulators and so eventually she Began Remicade April 2007 While continuing on azathioprine. Early 2008, stopped azathioprine and she tolerated solo maintenance Remicade (5 mg per kilogram) well. April 2009 continues to tolerate Remicade infusions very well, getting her infusions at Dr. Tanna Furry office. 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 Her Remicade dosing was increased to 10 mg per kilogram. Winter, 2010 presented with right lower quadrant abscess, clear fistula connection between small bowel and the abscess. 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 Infection was drained with interventional radiology, drain eventually pulled however abscess recurred within days and so right lower quadrant drain was replaced. January, 2011: she is on azathioprine 100 mg a day, Augmentin twice daily, planning for surgery late January. January, 2011: ileocecectomy performed for active disease, persistent fistula, abscess. April, 2012 doing well on Remicade every 8 weeks.  2. May, 2011: polynidol cyst drained by urgent care; drained another time 2011. eventually surgically resected spring 2012   HPI: This is a very pleasant 30 year old woman whom I last saw about 3 months ago.  Anal pain dramatically improved.  She has solid BMs. She has some pressure in rectum following a BM.  Has dyspepsia  No blood in stools, no abd pains.    Had a very loose stool diarrhea stool the day of the BM.    Past Medical History  Diagnosis Date  . Chronic rhinitis   . Acute swimmers' ear   . Contact dermatitis and other eczema, due to unspecified cause   . Regional enteritis of small intestine   .  Muscle abscess   . Rectal abscess   . Mononucleosis   . Obesity   . Pneumonia   . Pilonidal abscess   . Fibroids   . Crohn's     Past Surgical History  Procedure Date  . Knee arthroscopy 99, 02  . Appendectomy 7/06  . Ileocecal resection 1/11    crohns  . Pilonidal cyst excision     summer 2012    Current Outpatient Prescriptions  Medication Sig Dispense Refill  . Calcium Carbonate-Vit D-Min (CALTRATE PLUS PO) Take by mouth daily.        . drospirenone-ethinyl estradiol (YAZ,GIANVI,LORYNA) 3-0.02 MG tablet Take 1 tablet by mouth daily.        [name] inFLIXimab (REMICADE) 100 MG injection Inject 10 mg/kg into the vein every 8 (eight) weeks.        [name] loratadine (CLARITIN) 10 MG tablet Take 10 mg by mouth daily.        . Multiple Vitamin (MULTIVITAMIN) capsule Take 1 capsule by mouth daily.          Allergies as of 04/09/2011  . (No Known Allergies)    Family History  Problem Relation Age of Onset  . Aneurysm Mother     brain  . Diabetes Sister     half sister  . Bipolar disorder Sister     half sister  . Colon cancer Maternal Grandmother   . Stomach cancer Paternal Grandfather   . Multiple sclerosis Sister   . Healthy Father     fairly    History   Social History  . Marital Status: Single    Spouse Name: N/A  Number of Children: N/A  . Years of Education: N/A   Occupational History  . math teacher    Social History Main Topics  . Smoking status: Never Smoker   . Smokeless tobacco: Never Used  . Alcohol Use: No  . Drug Use: No  . Sexually Active: Not on file   Other Topics Concern  . 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 female with Crohn's disease, brief anal discomfort  She said her anal pain is completely gone. I did not do an anal exam on her today since she is symptom free. I doubt that this was Crohn's-related since it was such a brief episode. More likely symptomatic hemorrhoids or small fissure. She is to call here if she has recurrence.

## 2011-10-02 ENCOUNTER — Encounter (INDEPENDENT_AMBULATORY_CARE_PROVIDER_SITE_OTHER): Payer: Self-pay | Admitting: Surgery

## 2011-10-02 ENCOUNTER — Ambulatory Visit (INDEPENDENT_AMBULATORY_CARE_PROVIDER_SITE_OTHER): Payer: BC Managed Care – PPO | Admitting: Surgery

## 2011-10-02 VITALS — BP 134/82 | HR 68 | Temp 97.1°F | Resp 16 | Ht 62.5 in | Wt 243.0 lb

## 2011-10-02 DIAGNOSIS — K5090 Crohn's disease, unspecified, without complications: Secondary | ICD-10-CM

## 2011-10-02 DIAGNOSIS — K432 Incisional hernia without obstruction or gangrene: Secondary | ICD-10-CM

## 2011-10-02 DIAGNOSIS — Z6841 Body Mass Index (BMI) 40.0 and over, adult: Secondary | ICD-10-CM

## 2011-10-02 HISTORY — DX: Incisional hernia without obstruction or gangrene: K43.2

## 2011-10-02 NOTE — Progress Notes (Signed)
Subjective:     Patient ID: [name], female   DOB: 1980/07/07, 31 y.o.   MRN: [id number]  HPI  [name]  01/23/1981 [id number]  Patient Care Team: Burnis Medin, MD as PCP - General Milus Banister, MD (Gastroenterology) Delice Lesch, [name], MD (Rheumatology)  This patient is a 31 y.o.female who presents today for surgical evaluation at the request of Dr. Regis Bill.   Reason for visit: Consideration of repair of incisional hernia.  Patient is a Donna morbidly obese female. She is Crohn's. She is followed by Dr. Ardis Hughs. She gets Remicade every 2 months. Her disease is more stable. She does have a history of recurrent pilonidal infections. Last drained year ago. No new skin infections.  She required urgent ileocecostomy 2011 by my partner Dr. Zenia Resides whom has since left the practice. Patient developed a hernia at her incision. She held off on repair. She notes it's getting larger. She is due for a summer break. She is a Pharmacist, hospital. She is hoping to get it repaired the summer.  Patient Active Problem List  Diagnoses  . FIBROIDS, UTERUS  . PERSISTENT DISORDER INIT/MAINTAINING WAKEFULNESS  . TONSILLAR HYPERTROPHY  . CROHN'S DISEASE  . ECZEMA  . ERYTHEMA NODOSUM  . THYROID FUNCTION TEST, ABNORMAL  . UNSPECIFIED SLEEP DISTURBANCE  . Left wrist pain  . Cerumen impaction  . Back pain  . External otitis  . Voiding dysfunction  . Morbid obesity with body mass index of 40.0-44.9 in adult  . Hypoalbuminemia  . Incisional hernia, RLQ    Past Medical History  Diagnosis Date  . Chronic rhinitis   . Acute swimmers' ear   . Contact dermatitis and other eczema, due to unspecified cause   . Regional enteritis of small intestine   . Muscle abscess   . Rectal abscess   . Mononucleosis   . Obesity   . Pneumonia   . Pilonidal abscess   . Fibroids   . Crohn's   . Hernia     Past Surgical History  Procedure Date  . Knee arthroscopy  99, 02  . Appendectomy 7/06  . Ileocecal resection 1/11    crohns disease with fisula/stricture. Dr. Ronnald Collum  . Pilonidal cyst excision 2011, 2012    I&Ds    History   Social History  . Marital Status: Single    Spouse Name: N/A    Number of Children: N/A  . Years of Education: N/A   Occupational History  . math teacher    Social History Main Topics  . Smoking status: Never Smoker   . Smokeless tobacco: Never Used  . Alcohol Use: No  . Drug Use: No  . Sexually Active: Not on file   Other Topics Concern  . 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 Aneurysm Mother     brain  . Diabetes Sister     half sister  . Bipolar disorder Sister     half sister  . Colon cancer Maternal Grandmother   . Stomach cancer Paternal Grandfather   . Multiple sclerosis Sister   . Healthy Father     fairly    Current Outpatient Prescriptions  Medication Sig Dispense Refill  . Calcium Carbonate-Vit D-Min (  CALTRATE PLUS PO) Take by mouth daily.        . drospirenone-ethinyl estradiol (YAZ,GIANVI,LORYNA) 3-0.02 MG tablet Take 1 tablet by mouth daily.        [name] inFLIXimab (REMICADE) 100 MG injection Inject 10 mg/kg into the vein every 8 (eight) weeks.        [name] loratadine (CLARITIN) 10 MG tablet Take 10 mg by mouth daily.        . Multiple Vitamin (MULTIVITAMIN) capsule Take 1 capsule by mouth daily.           No Known Allergies  BP 134/82  Pulse 68  Temp(Src) 97.1 F (36.2 C) (Temporal)  Resp 16  Ht 5' 2.5" (1.588 m)  Wt 243 lb (110.224 kg)  BMI 43.74 kg/m2     Review of Systems  Constitutional: Negative for fever, chills, diaphoresis, appetite change and fatigue.  HENT: Negative for ear pain, sore throat, trouble swallowing, neck pain and ear discharge.    Eyes: Negative for photophobia, discharge and visual disturbance.  Respiratory: Negative for cough, choking, chest tightness and shortness of breath.   Cardiovascular: Negative for chest pain and palpitations.       Patient walks 30 minutes for about 1 miles without difficulty.  No exertional chest/neck/shoulder/arm pain.   Gastrointestinal: Negative for nausea, vomiting, abdominal pain, diarrhea, constipation, anal bleeding and rectal pain.       No personal nor family history of GI/colon cancer, +CROHN's irritable bowel syndrome, allergy such as Celiac Sprue, dietary/dairy problems, colitis, ulcers nor gastritis.    BM 2-3x/day  No recent sick contacts/gastroenteritis.  No travel outside the country.  No changes in diet.    Genitourinary: Negative for dysuria, frequency and difficulty urinating.  Musculoskeletal: Negative for myalgias and gait problem.  Skin: Negative for color change, pallor and rash.  Neurological: Negative for dizziness, speech difficulty, weakness and numbness.  Hematological: Negative for adenopathy.  Psychiatric/Behavioral: Negative for confusion and agitation. The patient is not nervous/anxious.        Objective:   Physical Exam  Constitutional: She is oriented to person, place, and time. She appears well-developed and well-nourished. No distress.       Pear shaped obesity  HENT:  Head: Normocephalic.  Mouth/Throat: Oropharynx is clear and moist. No oropharyngeal exudate.  Eyes: Conjunctivae and EOM are normal. Pupils are equal, round, and reactive to light. No scleral icterus.  Neck: Normal range of motion. Neck supple. No tracheal deviation present.  Cardiovascular: Normal rate, regular rhythm and intact distal pulses.   Pulmonary/Chest: Effort normal and breath sounds normal. No respiratory distress. She exhibits no tenderness.  Abdominal: Soft. She exhibits no distension and no mass. There is no tenderness. There is no rebound and no CVA tenderness. A  hernia is present. Hernia confirmed positive in the ventral area. Hernia confirmed negative in the right inguinal area and confirmed negative in the left inguinal area.         Obese.    Genitourinary: No vaginal discharge found.  Musculoskeletal: Normal range of motion. She exhibits no tenderness.  Lymphadenopathy:    She has no cervical adenopathy.       Right: No inguinal adenopathy present.       Left: No inguinal adenopathy present.  Neurological: She is alert and oriented to person, place, and time. No cranial nerve deficit. She exhibits normal muscle tone. Coordination normal.  Skin: Skin is warm and dry. No rash noted. She is not diaphoretic. No erythema.  Psychiatric: She has  a normal mood and affect. Her behavior is normal. Judgment and thought content normal.       Assessment:     [place] RLQ in morbidly obese pt chronically immunosuppressed with prior pilonidal infections    Plan:     She is above average risk for infection given her pilonidal drainage. However that is improved and stable now. Her Crohn's disease is stable so hopefully she will not develop any fistulae or other problems.  Given her morbid obesity, I do not think that primary. Will be sufficient. She required mesh. Probably a larger sheet given her risk factors. I think is reasonable start out laparoscopically provided to her not to severe adhesions. Undecided using a ultra light weight polypropylene versus parietex depending on how things go. She is interested in repair.  The anatomy & physiology of the abdominal wall was discussed.  The pathophysiology of hernias was discussed.  Natural history risks without surgery including progeressive enlargement, pain, incarceration & strangulation was discussed.   Contributors to complications such as smoking, obesity, diabetes, prior surgery, etc were discussed.   I feel the risks of no intervention will lead to serious problems that outweigh the operative risks;  therefore, I recommended surgery to reduce and repair the hernia.  I explained laparoscopic techniques with possible need for an open approach.  I noted the probable use of mesh to patch and/or buttress the hernia repair  Risks such as bleeding, infection, abscess, need for further treatment, heart attack, death, and other risks were discussed.  I noted a good likelihood this will help address the problem.   Goals of post-operative recovery were discussed as well.  Possibility that this will not correct all symptoms was explained.  I stressed the importance of low-impact activity, aggressive pain control, avoiding constipation, & not pushing through pain to minimize risk of post-operative chronic pain or injury. Possibility of reherniation especially with smoking, obesity, diabetes, immunosuppression, and other health conditions was discussed.  We will work to minimize complications.     An educational handout further explaining the pathology & treatment options was given as well.  Questions were answered.  The patient expresses understanding & wishes to proceed with surgery.

## 2011-10-02 NOTE — Patient Instructions (Signed)
Hernia Repair with Laparoscope A hernia occurs when an internal organ pushes out through a weak spot in the belly (abdominal) wall muscles. Hernias most commonly occur in the groin and around the navel. Hernias can also occur through a cut by the surgeon (incision) after an abdominal operation. A hernia may be caused by:  Lifting heavy objects.   Prolonged coughing.   Straining to move your bowels.  Hernias can often be pushed back into place (reduced). Most hernias tend to get worse over time. Problems occur when abdominal contents get stuck in the opening and the blood supply is blocked or impaired (incarcerated hernia). Because of these risks, you require surgery to repair the hernia. Your hernia will be repaired using a laparoscope. Laparoscopic surgery is a type of minimally invasive surgery. It does not involve making a typical surgical cut (incision) in the skin. A laparoscope is a telescope-like rod and lens system. It is usually connected to a video camera and a light source so your caregiver can clearly see the operative area. The instruments are inserted through  to  inch (5 mm or 10 mm) openings in the skin at specific locations. A working and viewing space is created by blowing a small amount of carbon dioxide gas into the abdominal cavity. The abdomen is essentially blown up like a balloon (insufflated). This elevates the abdominal wall above the internal organs like a dome. The carbon dioxide gas is common to the human body and can be absorbed by tissue and removed by the respiratory system. Once the repair is completed, the small incisions will be closed with either stitches (sutures) or staples (just like a paper stapler only this staple holds the skin together). LET YOUR CAREGIVERS KNOW ABOUT:  Allergies.   Medications taken including herbs, eye drops, over the counter medications, and creams.   Use of steroids (by mouth or creams).   Previous problems with anesthetics or  Novocaine.   Possibility of pregnancy, if this applies.   History of blood clots (thrombophlebitis).   History of bleeding or blood problems.   Previous surgery.   Other health problems.  BEFORE THE PROCEDURE  Laparoscopy can be done either in a hospital or out-patient clinic. You may be given a mild sedative to help you relax before the procedure. Once in the operating room, you will be given a general anesthesia to make you sleep (unless you and your caregiver choose a different anesthetic).  AFTER THE PROCEDURE  After the procedure you will be watched in a recovery area. Depending on what type of hernia was repaired, you might be admitted to the hospital or you might go home the same day. With this procedure you may have less pain and scarring. This usually results in a quicker recovery and less risk of infection. HOME CARE INSTRUCTIONS   Bed rest is not required. You may continue your normal activities but avoid heavy lifting (more than 10 pounds) or straining.   Cough gently. If you are a smoker it is best to stop, as even the best hernia repair can break down with the continual strain of coughing.   Avoid driving until given the OK by your surgeon.   There are no dietary restrictions unless given otherwise.   TAKE ALL MEDICATIONS AS DIRECTED.   Only take over-the-counter or prescription medicines for pain, discomfort, or fever as directed by your caregiver.  SEEK MEDICAL CARE IF:   There is increasing abdominal pain or pain in your incisions.  There is more bleeding from incisions, other than minimal spotting.   You feel light headed or faint.   You develop an unexplained fever, chills, and/or an oral temperature above 102 F (38.9 C).   You have redness, swelling, or increasing pain in the wound.   Pus coming from wound.   A foul smell coming from the wound or dressings.  SEEK IMMEDIATE MEDICAL CARE IF:   You develop a rash.   You have difficulty breathing.    You have any allergic problems.  MAKE SURE YOU:   Understand these instructions.   Will watch your condition.   Will get help right away if you are not doing well or get worse.  Document Released: 05/14/2005 Document Revised: 05/03/2011 Document Reviewed: 04/13/2009 [facility] Patient Information 2012 [place].

## 2011-10-23 ENCOUNTER — Ambulatory Visit (HOSPITAL_COMMUNITY): Admission: RE | Admit: 2011-10-23 | Payer: BC Managed Care – PPO | Source: Ambulatory Visit | Admitting: Surgery

## 2011-10-23 ENCOUNTER — Encounter (HOSPITAL_COMMUNITY): Admission: RE | Payer: Self-pay | Source: Ambulatory Visit

## 2011-10-23 SURGERY — REPAIR, HERNIA, VENTRAL, LAPAROSCOPIC
Anesthesia: General

## 2011-10-29 ENCOUNTER — Telehealth: Payer: Self-pay | Admitting: Gastroenterology

## 2011-10-29 NOTE — Telephone Encounter (Signed)
noted 

## 2011-11-15 ENCOUNTER — Encounter (HOSPITAL_COMMUNITY)
Admission: RE | Admit: 2011-11-15 | Discharge: 2011-11-15 | Disposition: A | Discharge status: Home / Self Care | Payer: BC Managed Care – PPO | Source: Ambulatory Visit | Attending: Surgery | Admitting: Surgery

## 2011-11-15 ENCOUNTER — Encounter (HOSPITAL_COMMUNITY): Payer: Self-pay

## 2011-11-15 ENCOUNTER — Encounter (HOSPITAL_COMMUNITY): Payer: Self-pay | Admitting: Pharmacy Technician

## 2011-11-15 DIAGNOSIS — K5090 Crohn's disease, unspecified, without complications: Secondary | ICD-10-CM | POA: Insufficient documentation

## 2011-11-15 DIAGNOSIS — D219 Benign neoplasm of connective and other soft tissue, unspecified: Secondary | ICD-10-CM

## 2011-11-15 HISTORY — PX: EYE SURGERY: SHX253

## 2011-11-15 HISTORY — DX: Benign neoplasm of connective and other soft tissue, unspecified: D21.9

## 2011-11-15 HISTORY — DX: Crohn's disease, unspecified, without complications: K50.90

## 2011-11-15 LAB — CBC
HCT: 36.4 % (ref 36.0–46.0)
Hemoglobin: 11 g/dL — ABNORMAL LOW (ref 12.0–15.0)
MCH: 24.2 pg — ABNORMAL LOW (ref 26.0–34.0)
MCHC: 30.2 g/dL (ref 30.0–36.0)
MCV: 80.2 fL (ref 78.0–100.0)
RBC: 4.54 MIL/uL (ref 3.87–5.11)

## 2011-11-15 NOTE — Pre-Procedure Instructions (Signed)
11-15-11 Teach back method used for preop instructions.

## 2011-11-15 NOTE — Patient Instructions (Addendum)
[facility]  11/15/2011   Your procedure is scheduled on:   11-27-2011  Report to [place] at     [place]   AM.  Call this number if you have problems the morning of surgery: [phone]   Remember:   Do not eat food:After Midnight.  Take these medicines the morning of surgery with A SIP OF WATER: Claritin, eye drops if needed   Do not wear jewelry, make-up or nail polish.  Do not wear lotions, powders, or perfumes. You may wear deodorant.  Do not shave 48 hours prior to surgery.(face and neck okay, no shaving of legs)  Do not bring valuables to the hospital.  Contacts, dentures or bridgework may not be worn into surgery.  Leave suitcase in the car. After surgery it may be brought to your room.  For patients admitted to the hospital, checkout time is 11:00 AM the day of discharge.   Patients discharged the day of surgery will not be allowed to drive home.  Name and phone number of your driver: family  Special Instructions: CHG Shower Use Special Wash: 1/2 bottle night before surgery and 1/2 bottle morning of surgery.(avoid face and genitals)   Please read over the following fact sheets that you were given: MRSA Information.

## 2011-11-23 ENCOUNTER — Telehealth (INDEPENDENT_AMBULATORY_CARE_PROVIDER_SITE_OTHER): Payer: Self-pay | Admitting: General Surgery

## 2011-11-23 NOTE — Telephone Encounter (Signed)
Patient called in to ask if it would be ok for her to go up stairs after she has hernia surgery. She lives on the second floor and concerned. I advised her that if the stairs are fairly spaced apart and steep that it may be best to stay with a friend who lives in a one story house on on the first floor of apartment for the first couple days. However, if the stairs are of reasonable height to make sure to take her time going up the stairs, use the railing for support and to take her time. Also advised she may have pain medication issued and that would help her. Also advised that she use caution with the stairs as needed. Patient agreed.

## 2011-11-27 ENCOUNTER — Encounter (HOSPITAL_COMMUNITY): Payer: Self-pay | Admitting: Anesthesiology

## 2011-11-27 ENCOUNTER — Ambulatory Visit (HOSPITAL_COMMUNITY): Payer: BC Managed Care – PPO | Admitting: Anesthesiology

## 2011-11-27 ENCOUNTER — Encounter (HOSPITAL_COMMUNITY): Payer: Self-pay | Admitting: *Deleted

## 2011-11-27 ENCOUNTER — Ambulatory Visit (HOSPITAL_COMMUNITY)
Admission: RE | Admit: 2011-11-27 | Discharge: 2011-11-28 | Disposition: A | Discharge status: Home / Self Care | Payer: BC Managed Care – PPO | Source: Ambulatory Visit | Attending: Surgery | Admitting: Surgery

## 2011-11-27 ENCOUNTER — Encounter (HOSPITAL_COMMUNITY)
Admission: RE | Disposition: A | Discharge status: Home / Self Care | Payer: Self-pay | Source: Ambulatory Visit | Attending: Surgery

## 2011-11-27 DIAGNOSIS — K432 Incisional hernia without obstruction or gangrene: Secondary | ICD-10-CM | POA: Insufficient documentation

## 2011-11-27 DIAGNOSIS — Z79899 Other long term (current) drug therapy: Secondary | ICD-10-CM | POA: Insufficient documentation

## 2011-11-27 DIAGNOSIS — Z01812 Encounter for preprocedural laboratory examination: Secondary | ICD-10-CM | POA: Insufficient documentation

## 2011-11-27 DIAGNOSIS — K660 Peritoneal adhesions (postprocedural) (postinfection): Secondary | ICD-10-CM | POA: Insufficient documentation

## 2011-11-27 DIAGNOSIS — K5090 Crohn's disease, unspecified, without complications: Secondary | ICD-10-CM | POA: Insufficient documentation

## 2011-11-27 DIAGNOSIS — D259 Leiomyoma of uterus, unspecified: Secondary | ICD-10-CM | POA: Insufficient documentation

## 2011-11-27 HISTORY — PX: VENTRAL HERNIA REPAIR: SHX424

## 2011-11-27 SURGERY — REPAIR, HERNIA, VENTRAL, LAPAROSCOPIC
Anesthesia: General | Site: Abdomen | Wound class: Clean

## 2011-11-27 MED ORDER — DEXAMETHASONE SODIUM PHOSPHATE 10 MG/ML IJ SOLN
INTRAMUSCULAR | Status: DC | PRN
  Administered 2011-11-27: 10 mg via INTRAVENOUS

## 2011-11-27 MED ORDER — PROMETHAZINE HCL 25 MG/ML IJ SOLN: 12.5000 mg | Freq: Four times a day (QID) | INTRAMUSCULAR | Status: DC | PRN

## 2011-11-27 MED ORDER — MAGIC MOUTHWASH
15.0000 mL | Freq: Four times a day (QID) | ORAL | Status: DC | PRN
  Filled 2011-11-27: qty 15

## 2011-11-27 MED ORDER — OXYCODONE HCL 5 MG PO TABS: 5.0000 mg | ORAL_TABLET | ORAL | Status: AC | PRN

## 2011-11-27 MED ORDER — SODIUM CHLORIDE 0.9 % IJ SOLN
3.0000 mL | Freq: Two times a day (BID) | INTRAMUSCULAR | Status: DC
  Administered 2011-11-27: 3 mL via INTRAVENOUS

## 2011-11-27 MED ORDER — BUPIVACAINE 0.25 % ON-Q PUMP DUAL CATH 300 ML
300.0000 mL | INJECTION | Status: DC
  Filled 2011-11-27: qty 300

## 2011-11-27 MED ORDER — DROSPIRENONE-ETHINYL ESTRADIOL 3-0.02 MG PO TABS
1.0000 | ORAL_TABLET | Freq: Every day | ORAL | Status: DC
  Administered 2011-11-27 – 2011-11-28 (×2): 1 via ORAL

## 2011-11-27 MED ORDER — HYDROMORPHONE HCL PF 1 MG/ML IJ SOLN: 0.2500 mg | INTRAMUSCULAR | Status: DC | PRN

## 2011-11-27 MED ORDER — HEPARIN SODIUM (PORCINE) 5000 UNIT/ML IJ SOLN
5000.0000 [IU] | Freq: Once | INTRAMUSCULAR | Status: AC
  Administered 2011-11-27: 5000 [IU] via SUBCUTANEOUS

## 2011-11-27 MED ORDER — PROPOFOL 10 MG/ML IV EMUL
INTRAVENOUS | Status: DC | PRN
  Administered 2011-11-27: 200 mg via INTRAVENOUS

## 2011-11-27 MED ORDER — LORATADINE 10 MG PO TABS
10.0000 mg | ORAL_TABLET | Freq: Every day | ORAL | Status: DC
  Administered 2011-11-27: 10 mg via ORAL
  Filled 2011-11-27 (×2): qty 1

## 2011-11-27 MED ORDER — ACETAMINOPHEN 650 MG RE SUPP: 650.0000 mg | RECTAL | Status: DC | PRN

## 2011-11-27 MED ORDER — CHLORHEXIDINE GLUCONATE 4 % EX LIQD
1.0000 "application " | Freq: Once | CUTANEOUS | Status: DC
  Filled 2011-11-27: qty 15

## 2011-11-27 MED ORDER — PSYLLIUM 95 % PO PACK
1.0000 | PACK | Freq: Two times a day (BID) | ORAL | Status: DC
  Administered 2011-11-27 – 2011-11-28 (×2): 1 via ORAL
  Filled 2011-11-27 (×3): qty 1

## 2011-11-27 MED ORDER — CEFAZOLIN SODIUM-DEXTROSE 2-3 GM-% IV SOLR
INTRAVENOUS | Status: AC
  Filled 2011-11-27: qty 50

## 2011-11-27 MED ORDER — LACTATED RINGERS IR SOLN
Status: DC | PRN
  Administered 2011-11-27: 1000 mL

## 2011-11-27 MED ORDER — BUPIVACAINE 0.25 % ON-Q PUMP DUAL CATH 300 ML
INJECTION | Status: DC | PRN
  Administered 2011-11-27: 300 mL

## 2011-11-27 MED ORDER — SUCCINYLCHOLINE CHLORIDE 20 MG/ML IJ SOLN
INTRAMUSCULAR | Status: DC | PRN
  Administered 2011-11-27: 100 mg via INTRAVENOUS

## 2011-11-27 MED ORDER — STERILE WATER FOR IRRIGATION IR SOLN
Status: DC | PRN
  Administered 2011-11-27: 1000 mL

## 2011-11-27 MED ORDER — ALUM & MAG HYDROXIDE-SIMETH 200-200-20 MG/5ML PO SUSP: 30.0000 mL | Freq: Four times a day (QID) | ORAL | Status: DC | PRN

## 2011-11-27 MED ORDER — GLYCOPYRROLATE 0.2 MG/ML IJ SOLN
INTRAMUSCULAR | Status: DC | PRN
  Administered 2011-11-27: .8 mg via INTRAVENOUS

## 2011-11-27 MED ORDER — LACTATED RINGERS IV SOLN
INTRAVENOUS | Status: DC
  Administered 2011-11-27: 14:00:00 via INTRAVENOUS

## 2011-11-27 MED ORDER — HYDROMORPHONE HCL PF 1 MG/ML IJ SOLN
INTRAMUSCULAR | Status: DC | PRN
  Administered 2011-11-27 (×3): 1 mg via INTRAVENOUS

## 2011-11-27 MED ORDER — BUPIVACAINE-EPINEPHRINE 0.25% -1:200000 IJ SOLN
INTRAMUSCULAR | Status: AC
  Filled 2011-11-27: qty 1

## 2011-11-27 MED ORDER — BUPIVACAINE-EPINEPHRINE PF 0.25-1:200000 % IJ SOLN
INTRAMUSCULAR | Status: AC
  Filled 2011-11-27: qty 30

## 2011-11-27 MED ORDER — ONDANSETRON HCL 4 MG/2ML IJ SOLN: 4.0000 mg | Freq: Four times a day (QID) | INTRAMUSCULAR | Status: DC | PRN

## 2011-11-27 MED ORDER — LIDOCAINE HCL (CARDIAC) 20 MG/ML IV SOLN
INTRAVENOUS | Status: DC | PRN
  Administered 2011-11-27: 100 mg via INTRAVENOUS

## 2011-11-27 MED ORDER — BISACODYL 10 MG RE SUPP: 10.0000 mg | Freq: Two times a day (BID) | RECTAL | Status: DC | PRN

## 2011-11-27 MED ORDER — ACETAMINOPHEN 325 MG PO TABS: 650.0000 mg | ORAL_TABLET | ORAL | Status: DC | PRN

## 2011-11-27 MED ORDER — LIP MEDEX EX OINT
1.0000 "application " | TOPICAL_OINTMENT | Freq: Two times a day (BID) | CUTANEOUS | Status: DC
  Administered 2011-11-27 – 2011-11-28 (×2): 1 via TOPICAL
  Filled 2011-11-27: qty 7

## 2011-11-27 MED ORDER — HEPARIN SODIUM (PORCINE) 5000 UNIT/ML IJ SOLN
INTRAMUSCULAR | Status: AC
  Filled 2011-11-27: qty 1

## 2011-11-27 MED ORDER — SODIUM CHLORIDE 0.9 % IJ SOLN: 3.0000 mL | INTRAMUSCULAR | Status: DC | PRN

## 2011-11-27 MED ORDER — ROCURONIUM BROMIDE 100 MG/10ML IV SOLN
INTRAVENOUS | Status: DC | PRN
  Administered 2011-11-27: 10 mg via INTRAVENOUS
  Administered 2011-11-27: 20 mg via INTRAVENOUS
  Administered 2011-11-27: 10 mg via INTRAVENOUS

## 2011-11-27 MED ORDER — ACETAMINOPHEN 10 MG/ML IV SOLN
INTRAVENOUS | Status: AC
  Filled 2011-11-27: qty 100

## 2011-11-27 MED ORDER — ACETAMINOPHEN 10 MG/ML IV SOLN
INTRAVENOUS | Status: DC | PRN
  Administered 2011-11-27: 1000 mg via INTRAVENOUS

## 2011-11-27 MED ORDER — NAPROXEN 500 MG PO TABS
500.0000 mg | ORAL_TABLET | Freq: Two times a day (BID) | ORAL | Status: DC
  Administered 2011-11-27 – 2011-11-28 (×2): 500 mg via ORAL
  Filled 2011-11-27 (×5): qty 1

## 2011-11-27 MED ORDER — MAGNESIUM HYDROXIDE 400 MG/5ML PO SUSP: 30.0000 mL | Freq: Two times a day (BID) | ORAL | Status: DC | PRN

## 2011-11-27 MED ORDER — OXYCODONE HCL 5 MG PO TABS
5.0000 mg | ORAL_TABLET | ORAL | Status: DC | PRN
  Administered 2011-11-27 (×2): 10 mg via ORAL
  Administered 2011-11-28: 5 mg via ORAL
  Administered 2011-11-28 (×2): 10 mg via ORAL
  Filled 2011-11-27 (×5): qty 2

## 2011-11-27 MED ORDER — LACTATED RINGERS IV BOLUS (SEPSIS)
1000.0000 mL | Freq: Three times a day (TID) | INTRAVENOUS | Status: DC | PRN
  Administered 2011-11-27 – 2011-11-28 (×2): 1000 mL via INTRAVENOUS

## 2011-11-27 MED ORDER — DIPHENHYDRAMINE HCL 25 MG PO TABS
25.0000 mg | ORAL_TABLET | Freq: Four times a day (QID) | ORAL | Status: DC | PRN
  Filled 2011-11-27: qty 1

## 2011-11-27 MED ORDER — NEOSTIGMINE METHYLSULFATE 1 MG/ML IJ SOLN
INTRAMUSCULAR | Status: DC | PRN
  Administered 2011-11-27: 5 mg via INTRAVENOUS

## 2011-11-27 MED ORDER — FENTANYL CITRATE 0.05 MG/ML IJ SOLN
INTRAMUSCULAR | Status: DC | PRN
  Administered 2011-11-27 (×2): 50 ug via INTRAVENOUS
  Administered 2011-11-27 (×2): 100 ug via INTRAVENOUS
  Administered 2011-11-27 (×2): 50 ug via INTRAVENOUS
  Administered 2011-11-27: 100 ug via INTRAVENOUS

## 2011-11-27 MED ORDER — FENTANYL CITRATE 0.05 MG/ML IJ SOLN
25.0000 ug | INTRAMUSCULAR | Status: DC | PRN
  Administered 2011-11-27 (×2): 50 ug via INTRAVENOUS
  Filled 2011-11-27 (×2): qty 2

## 2011-11-27 MED ORDER — ONDANSETRON HCL 4 MG/2ML IJ SOLN
INTRAMUSCULAR | Status: DC | PRN
  Administered 2011-11-27: 4 mg via INTRAVENOUS

## 2011-11-27 MED ORDER — SODIUM CHLORIDE 0.9 % IV SOLN: 250.0000 mL | INTRAVENOUS | Status: DC | PRN

## 2011-11-27 MED ORDER — CEFAZOLIN SODIUM-DEXTROSE 2-3 GM-% IV SOLR
2.0000 g | Freq: Once | INTRAVENOUS | Status: AC
  Administered 2011-11-27: 2 g via INTRAVENOUS

## 2011-11-27 MED ORDER — DEXTROSE 5 % IV SOLN
3.0000 g | Freq: Once | INTRAVENOUS | Status: DC
  Filled 2011-11-27: qty 30

## 2011-11-27 MED ORDER — LACTATED RINGERS IV SOLN
INTRAVENOUS | Status: DC | PRN
  Administered 2011-11-27 (×2): via INTRAVENOUS
  Administered 2011-11-27: 1000 mL

## 2011-11-27 MED ORDER — BUPIVACAINE-EPINEPHRINE 0.25% -1:200000 IJ SOLN
INTRAMUSCULAR | Status: DC | PRN
  Administered 2011-11-27: 80 mL

## 2011-11-27 MED ORDER — CEFAZOLIN SODIUM-DEXTROSE 2-3 GM-% IV SOLR: 2.0000 g | INTRAVENOUS | Status: DC

## 2011-11-27 MED ORDER — MIDAZOLAM HCL 5 MG/5ML IJ SOLN
INTRAMUSCULAR | Status: DC | PRN
  Administered 2011-11-27: 2 mg via INTRAVENOUS

## 2011-11-27 MED ORDER — DIPHENHYDRAMINE HCL 25 MG PO CAPS: 25.0000 mg | ORAL_CAPSULE | Freq: Four times a day (QID) | ORAL | Status: DC | PRN

## 2011-11-27 MED ORDER — PROMETHAZINE HCL 25 MG/ML IJ SOLN: 6.2500 mg | INTRAMUSCULAR | Status: DC | PRN

## 2011-11-27 SURGICAL SUPPLY — 47 items
APPLIER CLIP 5 13 M/L LIGAMAX5 (MISCELLANEOUS)
APR CLP MED LRG 5 ANG JAW (MISCELLANEOUS)
BINDER ABD UNIV 12 45-62 (WOUND CARE) IMPLANT
BINDER ABDOMINAL 46IN 62IN (WOUND CARE)
CANISTER SUCTION 2500CC (MISCELLANEOUS) ×2 IMPLANT
CATH KIT ON Q 7.5IN SLV (PAIN MANAGEMENT) ×2 IMPLANT
CLIP APPLIE 5 13 M/L LIGAMAX5 (MISCELLANEOUS) IMPLANT
CLOTH BEACON ORANGE TIMEOUT ST (SAFETY) ×2 IMPLANT
DECANTER SPIKE VIAL GLASS SM (MISCELLANEOUS) ×2 IMPLANT
DEVICE SECURE STRAP 25 ABSORB (INSTRUMENTS) ×2 IMPLANT
DEVICE TROCAR PUNCTURE CLOSURE (ENDOMECHANICALS) ×1 IMPLANT
DRAPE LAPAROSCOPIC ABDOMINAL (DRAPES) ×2 IMPLANT
DRSG TEGADERM 2-3/8X2-3/4 SM (GAUZE/BANDAGES/DRESSINGS) ×6 IMPLANT
ELECT REM PT RETURN 9FT ADLT (ELECTROSURGICAL) ×2
ELECTRODE REM PT RTRN 9FT ADLT (ELECTROSURGICAL) ×1 IMPLANT
FILTER SMOKE EVAC LAPAROSHD (FILTER) IMPLANT
GAUZE SPONGE 2X2 8PLY STRL LF (GAUZE/BANDAGES/DRESSINGS) IMPLANT
GLOVE ECLIPSE 8.0 STRL XLNG CF (GLOVE) ×2 IMPLANT
GLOVE INDICATOR 8.0 STRL GRN (GLOVE) ×4 IMPLANT
GOWN STRL NON-REIN LRG LVL3 (GOWN DISPOSABLE) ×4 IMPLANT
GOWN STRL REIN XL XLG (GOWN DISPOSABLE) ×4 IMPLANT
HAND ACTIVATED (MISCELLANEOUS) IMPLANT
KIT BASIN OR (CUSTOM PROCEDURE TRAY) ×2 IMPLANT
MESH PHYSIO OVAL 20X25CM (Mesh General) ×1 IMPLANT
NDL SPNL 22GX3.5 QUINCKE BK (NEEDLE) IMPLANT
NEEDLE SPNL 22GX3.5 QUINCKE BK (NEEDLE) ×2 IMPLANT
NS IRRIG 1000ML POUR BTL (IV SOLUTION) ×2 IMPLANT
PAIN PUMP ON-Q 400MLX5ML 5IN (MISCELLANEOUS) ×1 IMPLANT
PEN SKIN MARKING BROAD (MISCELLANEOUS) ×2 IMPLANT
PENCIL BUTTON HOLSTER BLD 10FT (ELECTRODE) IMPLANT
SCISSORS LAP 5X35 DISP (ENDOMECHANICALS) ×2 IMPLANT
SET IRRIG TUBING LAPAROSCOPIC (IRRIGATION / IRRIGATOR) IMPLANT
SLEEVE Z-THREAD 5X100MM (TROCAR) ×4 IMPLANT
SPONGE GAUZE 2X2 STER 10/PKG (GAUZE/BANDAGES/DRESSINGS)
STRIP CLOSURE SKIN 1/2X4 (GAUZE/BANDAGES/DRESSINGS) ×2 IMPLANT
STRIP CLOSURE SKIN 1/4X4 (GAUZE/BANDAGES/DRESSINGS) ×1 IMPLANT
SUT MNCRL AB 4-0 PS2 18 (SUTURE) ×2 IMPLANT
SUT PROLENE 1 CT 1 30 (SUTURE) ×7 IMPLANT
SUT VIC AB 2-0 UR6 27 (SUTURE) IMPLANT
TOWEL OR 17X26 10 PK STRL BLUE (TOWEL DISPOSABLE) ×2 IMPLANT
TRAY FOLEY CATH 14FRSI W/METER (CATHETERS) ×1 IMPLANT
TRAY LAP CHOLE (CUSTOM PROCEDURE TRAY) ×2 IMPLANT
TROCAR XCEL BLADELESS 5X75MML (TROCAR) ×2 IMPLANT
TROCAR Z-THREAD FIOS 11X100 BL (TROCAR) ×2 IMPLANT
TROCAR Z-THREAD FIOS 5X100MM (TROCAR) ×2 IMPLANT
TUBING INSUFFLATION 10FT LAP (TUBING) ×2 IMPLANT
TUNNELER SHEATH ON-Q 16GX12 DP (PAIN MANAGEMENT) ×1 IMPLANT

## 2011-11-27 NOTE — Preoperative (Signed)
Beta Blockers   Reason not to administer Beta Blockers:Not Applicable 

## 2011-11-27 NOTE — Brief Op Note (Signed)
11/27/2011  10:25 AM  PATIENT:  [name]  31 y.o. female  Patient Care Team: Burnis Medin, MD as PCP - General Milus Banister, MD (Gastroenterology) Delice Lesch, [name], MD (Rheumatology)  PRE-OPERATIVE DIAGNOSIS:  Incisional VWH  POST-OPERATIVE DIAGNOSIS:  Incisional [place] RLQ  PROCEDURE:  Procedure(s) (LRB): LAPAROSCOPIC VENTRAL HERNIA (N/A) INSERTION OF MESH (N/A) Takedown & retacking of bladder  SURGEON:  Surgeon(s) and Role:    * Adin Hector, MD - Primary  PHYSICIAN ASSISTANT:   ASSISTANTS: none   ANESTHESIA:   local and general  EBL:  Total I/O In: 1900 [I.V.:1900] Out: 325 [Urine:300; Blood:25]  BLOOD ADMINISTERED:none  DRAINS: none   LOCAL MEDICATIONS USED:  bupivicaine & OnQ pump   SPECIMEN:  No Specimen  DISPOSITION OF SPECIMEN:  N/A  COUNTS:  YES  TOURNIQUET:  * No tourniquets in log *  DICTATION: .Other Dictation: Dictation Number [id number]  PLAN OF CARE: Discharge to home after PACU  PATIENT DISPOSITION:  PACU - hemodynamically stable.   Delay start of Pharmacological VTE agent (>24hrs) due to surgical blood loss or risk of bleeding: not applicable

## 2011-11-27 NOTE — Discharge Instructions (Signed)
HERNIA REPAIR: POST OP INSTRUCTIONS  1. DIET: Follow a light bland diet the first 24 hours after arrival home, such as soup, liquids, crackers, etc.  Be sure to include lots of fluids daily.  Avoid fast food or heavy meals as your are more likely to get nauseated.  Eat a low fat the next few days after surgery. 2. Take your usually prescribed home medications unless otherwise directed. 3. PAIN CONTROL: a. Pain is best controlled by a usual combination of three different methods TOGETHER: i. Ice/Heat ii. Over the counter pain medication iii. Prescription pain medication b. Most patients will experience some swelling and bruising around the hernia(s) such as the bellybutton, groins, or old incisions.  Ice packs or heating pads (30-60 minutes up to 6 times a day) will help. Use ice for the first few days to help decrease swelling and bruising, then switch to heat to help relax tight/sore spots and speed recovery.  Some people prefer to use ice alone, heat alone, alternating between ice & heat.  Experiment to what works for you.  Swelling and bruising can take several weeks to resolve.   c. It is helpful to take an over-the-counter pain medication regularly for the first few weeks.  Choose one of the following that works best for you: i. Naproxen (Aleve, etc)  Two 294m tabs twice a day ii. Ibuprofen (Advil, etc) Three 2051mtabs four times a day (every meal & bedtime) iii. Acetaminophen (Tylenol, etc) 325-65028mour times a day (every meal & bedtime) d. A  prescription for pain medication should be given to you upon discharge.  Take your pain medication as prescribed.  i. If you are having problems/concerns with the prescription medicine (does not control pain, nausea, vomiting, rash, itching, etc), please call us [name] see if we need to switch you to a different pain medicine that will work better for you and/or control your side effect better. ii. If you need a refill on your pain  medication, please contact your pharmacy.  They will contact our office to request authorization. Prescriptions will not be filled after 5 pm or on week-ends. 4. Avoid getting constipated.  Between the surgery and the pain medications, it is common to experience some constipation.  Increasing fluid intake and taking a fiber supplement (such as Metamucil, Citrucel, FiberCon, MiraLax, etc) 1-2 times a day regularly will usually help prevent this problem from occurring.  A mild laxative (prune juice, Milk of Magnesia, MiraLax, etc) should be taken according to package directions if there are no bowel movements after 48 hours.   5. Wash / shower every day.  You may shower over the dressings as they are waterproof.   6. Remove your waterproof bandages 5 days after surgery.  You may leave the incision open to air.  You may replace a dressing/Band-Aid to cover the incision for comfort if you wish.  Continue to shower over incision(s) after the dressing is off.    7. ACTIVITIES as tolerated:   a. You may resume regular (light) daily activities beginning the next day--such as daily self-care, walking, climbing stairs--gradually increasing activities as tolerated.  If you can walk 30 minutes without difficulty, it is safe to try more intense activity such as jogging, treadmill, bicycling, low-impact aerobics, swimming, etc. b. Save the most intensive and strenuous activity for last such as sit-ups, heavy lifting, contact sports, etc  Refrain from any heavy lifting or straining until you are off narcotics for pain control.  c. DO NOT PUSH THROUGH PAIN.  Let pain be your guide: If it hurts to do something, don't do it.  Pain is your body warning you to avoid that activity for another week until the pain goes down. d. You may drive when you are no longer taking prescription pain medication, you can comfortably wear a seatbelt, and you can safely maneuver your car and apply brakes. e. Dennis Bast may have sexual intercourse  when it is comfortable.  8. FOLLOW UP in our office a. Please call [facility] at [phone] to set up an appointment to see your surgeon in the office for a follow-up appointment approximately 2-3 weeks after your surgery. b. Make sure that you call for this appointment the day you arrive home to insure a convenient appointment time. 9.  IF YOU HAVE DISABILITY OR FAMILY LEAVE FORMS, BRING THEM TO THE OFFICE FOR PROCESSING.  DO NOT GIVE THEM TO YOUR DOCTOR.  WHEN TO CALL us [phone]: 1. Poor pain control 2. Reactions / problems with new medications (rash/itching, nausea, etc)  3. Fever over 101.5 F (38.5 C) 4. Inability to urinate 5. Nausea and/or vomiting 6. Worsening swelling or bruising 7. Continued bleeding from incision. 8. Increased pain, redness, or drainage from the incision   The clinic staff is available to answer your questions during regular business hours (8:30am-5pm).  Please don't hesitate to call and ask to speak to one of our nurses for clinical concerns.   If you have a medical emergency, go to the nearest emergency room or call 911.  A surgeon from [facility] is always on call at the hospitals in [facility], [place], [place], [place], [place]  [address] ?  [address] [address], [place], [place]   [address] MAIN: [phone] ? TOLL FREE: [phone] ? FAX: [phone] [email or web address].

## 2011-11-27 NOTE — Op Note (Signed)
NAMEARYAA, BUNTING NO.:  [id number]  MEDICAL RECORD NO.:  [id number]  LOCATION:  [facility]                         FACILITY:  [facility]  PHYSICIAN:  Adin Hector, MD     DATE OF BIRTH:  20-May-1981  DATE OF PROCEDURE:  11/27/2011 DATE OF DISCHARGE:                              OPERATIVE REPORT   PRIMARY CARE PHYSICIAN:  [name]. Regis Bill, MD  GASTROENTEROLOGIST:  Milus Banister, MD  SURGEON:  Adin Hector, MD  ASSISTANT:  RN  PREOPERATIVE DIAGNOSIS:  Right lower quadrant incisional hernia status post small bowel resection for Crohn disease.  POSTOPERATIVE DIAGNOSIS:  Right lower quadrant incisional hernia status post small bowel resection for Crohn disease.  PROCEDURE PERFORMED: 1. Laparoscopic lysis of adhesions. 2. Laparoscopic takedown of bladder. 3. Laparoscopic ventral hernia repair with mesh. 4. Re-tacking of bladder.  ANESTHESIA: 1. General anesthesia 2. Local anesthetic and a field block around all fascial stitch sites. 3. ON-Q continuous bupivacaine pain pump.  SPECIMENS:  None.  DRAINS:  None.  ESTIMATED BLOOD LOSS:  Minimal.  COMPLICATIONS:  None apparent.  INDICATIONS:  [name] is a pleasant 31 year old morbidly obese female with Crohn disease.  She required an urgent ileocecal resection in 2012. She developed a hernia at her incision.  It has gotten larger and has become uncomfortable.  She wishes to have repair.  The anatomy and physiology of abdominal formation was discussed. Pathophysiology of herniation was discussed.  Options discussed and recommendations made for laparoscopic ventral hernia repair with mesh. Risks, benefits, and alternatives discussed.  Questions answered.  She agreed to proceed.  OPERATIVE FINDINGS: 1. She had a primarily vertical right lower quadrant incisional hernia     in the paramedian region more in the medial end of her prior     ileocecectomy site. 2. She had a fibroid uterus. 3. She had  moderate adhesions of omentum to the anterior abdominal     wall.  DESCRIPTION OF PROCEDURE:  Informed consent was confirmed.  The patient underwent general anesthesia without any difficulty.  She was positioned supine with arms tucked.  Her abdomen and mons pubis were prepped and draped in sterile fashion.  Surgical time-out confirmed our plan.  I placed a #5 mm port in the left upper quadrant using optical entry technique with the patient steep reverse Trendelenburg and left side up. Entry was clean.  Under direct visualization, I placed 5-mm port in the left flank.  I upsized the left upper quadrant port to a 10-mm port and placed a 5-mm port in the left lower quadrant laterally.  I turned attention to the abdominal examination.  She had some moderate omental adhesions that I carefully freed off using cold scissors and focused cautery.  I released that.  I ran the small bowel from the ileocolonic region more proximally and saw no intra-loop adhesions or other abnormalities.  She had a moderately enlarged fibroid uterus about 4 times normal.  I could see the obvious hernia defect.  I scored the peritoneum from the left lower quadrant across over to the right lower quadrant.  I got in the preperitoneal plane.  I freed the peritoneum down like  as you would for TAPP hernia repair.  I freed the bladder dome off the anterior abdominal wall and the pubis as well.  I marked out the region of defect.    Because she was morbidly obese and the hernia was a decent size, I chose a 25 x 20 cm dual-sided mesh (Physiomesh = ultralight Polypropylene/Monocryl).  I placed in the abdomen vertically so that the inferior edge tucked just below the pubic brim since it was rather low.  I secured it to the anterior abdominal wall using #1 Prolene interrupted primarily around the periphery on the upper 2/3rds and then along the pubic ridge inferiorly.  I then used a tacker to help tack the inferior edge to  the ligaments near the pubic rim.  I then tacked around the periphery and centrally as well.  This provided at least 10 cm coverage circumferentially given her morbid obesity, immunosuppressive state, and moderate hernia size.  I then retacked the peritoneum and bladder using the absorbable tacker back up to its original area and on the periphery.  I stayed away from the bladder itself, but just tacked the peritoneum.  Hemostasis was excellent.  I closed the fascial defect in the left upper quadrant using a 0-Vicryl stitch using laparoscopic suture passer under direct visualization.  I placed On Q tunnelers on right and left side.  I evacuated carbon dioxide, tried to stitch it down, closed the stitch sites with Steri- Strips and the port sites using Monocryl stitch.  The patient is being extubated to go to the recovery room.  If her skin is well controlled, she can leave today.  Otherwise, might have to stay overnight.     Adin Hector, MD     SCG/MEDQ  D:  11/27/2011  T:  11/27/2011  Job:  [id number]  cc:   [name]. Regis Bill, MD [place], [place] [address]  Milus Banister, MD [address] [place], [place] [address]

## 2011-11-27 NOTE — Transfer of Care (Signed)
Immediate Anesthesia Transfer of Care Note  Patient: [name]  Procedure(s) Performed: Procedure(s) (LRB): LAPAROSCOPIC VENTRAL HERNIA (N/A) INSERTION OF MESH (N/A)  Patient Location: PACU  Anesthesia Type: General  Level of Consciousness: sedated  Airway & Oxygen Therapy: Patient Spontanous Breathing and Patient connected to face mask oxygen  Post-op Assessment: Report given to PACU RN and Post -op Vital signs reviewed and stable  Post vital signs: Reviewed and stable  Complications: No apparent anesthesia complications

## 2011-11-27 NOTE — Anesthesia Preprocedure Evaluation (Addendum)
Anesthesia Evaluation  Patient identified by MRN, date of birth, ID band Patient awake    Reviewed: Allergy & Precautions, H&P , NPO status , Patient's Chart, lab work & pertinent test results  Airway Mallampati: II TM Distance: >3 FB Neck ROM: Full    Dental No notable dental hx.    Pulmonary  breath sounds clear to auscultation  Pulmonary exam normal       Cardiovascular negative cardio ROS  Rhythm:Regular Rate:Normal     Neuro/Psych negative neurological ROS  negative psych ROS   GI/Hepatic Neg liver ROS, Crohn's disease.   Endo/Other  negative endocrine ROSMorbid obesity  Renal/GU negative Renal ROS  negative genitourinary   Musculoskeletal negative musculoskeletal ROS (+)   Abdominal (+) + obese,   Peds negative pediatric ROS (+)  Hematology negative hematology ROS (+)   Anesthesia Other Findings   Reproductive/Obstetrics negative OB ROS                          Anesthesia Physical Anesthesia Plan  ASA: III  Anesthesia Plan: General   Post-op Pain Management:    Induction: Intravenous  Airway Management Planned: Oral ETT  Additional Equipment:   Intra-op Plan:   Post-operative Plan: Extubation in OR  Informed Consent: I have reviewed the patients History and Physical, chart, labs and discussed the procedure including the risks, benefits and alternatives for the proposed anesthesia with the patient or authorized representative who has indicated his/her understanding and acceptance.   Dental advisory given  Plan Discussed with: CRNA  Anesthesia Plan Comments:         Anesthesia Quick Evaluation

## 2011-11-27 NOTE — Anesthesia Postprocedure Evaluation (Signed)
  Anesthesia Post-op Note  Patient: [name]  Procedure(s) Performed: Procedure(s) (LRB): LAPAROSCOPIC VENTRAL HERNIA (N/A) INSERTION OF MESH (N/A)  Patient Location: PACU  Anesthesia Type: General  Level of Consciousness: awake and alert   Airway and Oxygen Therapy: Patient Spontanous Breathing  Post-op Pain: mild  Post-op Assessment: Post-op Vital signs reviewed, Patient's Cardiovascular Status Stable, Respiratory Function Stable, Patent Airway and No signs of Nausea or vomiting  Post-op Vital Signs: stable  Complications: No apparent anesthesia complications

## 2011-11-27 NOTE — H&P (Signed)
[name]  Jun 11, 1980 [id number]  CARE TEAM:  PCP: Lottie Dawson, MD  Outpatient Care Team: Patient Care Team: Donna Medin, MD as PCP - General Donna Banister, MD (Gastroenterology) Donna Lesch, MD (Obstetrics and Gynecology) Donna Duos, MD (Rheumatology)  Inpatient Treatment Team: Treatment Team: Attending Provider: Adin Hector, MD   This patient is a 31 y.o.female who presents today for surgical evaluation  Reason for visit: Consideration of repair of incisional hernia.  Patient is a pleasant morbidly obese female. She is Crohn's. She is followed by Dr. Ardis Hughs. She gets Remicade every 2 months. Her disease is more stable. She does have a history of recurrent pilonidal infections. Last drained year ago. No new skin infections.  She required urgent ileocecostomy 2011 by my partner Dr. [name] whom has since left the practice. Patient developed a hernia at her incision. She held off on repair. She notes it's getting larger. She is due for a summer break. She is a Pharmacist, hospital. She is hoping to get it repaired the summer.  No new events since last evaluation   Patient Active Problem List  Diagnosis  . FIBROIDS, UTERUS  . PERSISTENT DISORDER INIT/MAINTAINING WAKEFULNESS  . TONSILLAR HYPERTROPHY  . CROHN'S DISEASE  . ECZEMA  . ERYTHEMA NODOSUM  . THYROID FUNCTION TEST, ABNORMAL  . UNSPECIFIED SLEEP DISTURBANCE  . Left wrist pain  . Cerumen impaction  . Back pain  . External otitis  . Voiding dysfunction  . Morbid obesity with body mass index of 40.0-44.9 in adult  . Hypoalbuminemia  . Incisional hernia, RLQ    Past Medical History  Diagnosis Date  . Chronic rhinitis   . Acute swimmers' ear   . Contact dermatitis and other eczema, due to unspecified cause   . Regional enteritis of small intestine   . Rectal abscess   . Mononucleosis   . Obesity   . Pneumonia   . Pilonidal abscess   . Fibroids 11-15-11    remains with fibroids  . Crohn's 11-15-11      no issues in 2 yrs  . Hernia     Past Surgical History  Procedure Date  . Knee arthroscopy 99, 02    Bil.  [name] Appendectomy 7/06  . Ileocecal resection 1/11    crohns disease with fisula/stricture. Dr. Ronnald Collum  . Pilonidal cyst excision 2011, 2012    I&Ds  . Eye surgery 11-15-11    2003-multiple stys    History   Social History  . Marital Status: Single    Spouse Name: N/A    Number of Children: N/A  . Years of Education: N/A   Occupational History  . math teacher    Social History Main Topics  . Smoking status: Never Smoker   . Smokeless tobacco: Never Used  . Alcohol Use: No  . Drug Use: No  . Sexually Active: Not on file   Other Topics Concern  . 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 Aneurysm Mother     brain  . Diabetes Sister     half sister  . Bipolar disorder Sister     half sister  . Colon cancer Maternal Grandmother   . Stomach cancer Paternal Grandfather   . Multiple sclerosis Sister   .  Healthy Father     fairly    Current Facility-Administered Medications  Medication Dose Route Frequency Provider Last Rate Last Dose  . ceFAZolin (ANCEF) IVPB 2 g/50 mL premix  2 g Intravenous Once Donna Hector, MD      . chlorhexidine (HIBICLENS) 4 % liquid 1 application  1 application Topical Once Donna Hector, MD      . chlorhexidine (HIBICLENS) 4 % liquid 1 application  1 application Topical Once Donna Hector, MD      . heparin 5000 UNIT/ML injection           . heparin injection 5,000 Units  5,000 Units Subcutaneous Once Donna Hector, MD   5,000 Units at 11/27/11 0600  . DISCONTD: ceFAZolin (ANCEF) 3 g in dextrose 5 % 50 mL IVPB  3 g Intravenous Once Donna Hector, MD      . DISCONTD: ceFAZolin (ANCEF) IVPB 2 g/50 mL  premix  2 g Intravenous 60 min Pre-Op Donna Hector, MD       Facility-Administered Medications Ordered in Other Encounters  Medication Dose Route Frequency Provider Last Rate Last Dose  . lactated ringers infusion    Continuous PRN Francesco Sor, CRNA        Review of Systems  Constitutional: Negative for fever, chills, diaphoresis, appetite change and fatigue.  HENT: Negative for ear pain, sore throat, trouble swallowing, neck pain and ear discharge.  Eyes: Negative for photophobia, discharge and visual disturbance.  Respiratory: Negative for cough, choking, chest tightness and shortness of breath.  Cardiovascular: Negative for chest pain and palpitations.  Patient walks 30 minutes for about 1 miles without difficulty. No exertional chest/neck/shoulder/arm pain.  Gastrointestinal: Negative for nausea, vomiting, abdominal pain, diarrhea, constipation, anal bleeding and rectal pain.  No personal nor family history of GI/colon cancer, +CROHN's irritable bowel syndrome, allergy such as Celiac Sprue, dietary/dairy problems, colitis, ulcers nor gastritis.  BM 2-3x/day  No recent sick contacts/gastroenteritis. No travel outside the country. No changes in diet.  Genitourinary: Negative for dysuria, frequency and difficulty urinating.  Musculoskeletal: Negative for myalgias and gait problem.  Skin: Negative for color change, pallor and rash.  Neurological: Negative for dizziness, speech difficulty, weakness and numbness.  Hematological: Negative for adenopathy.  Psychiatric/Behavioral: Negative for confusion and agitation. The patient is not nervous/anxious.  Objective:   Physical Exam  Constitutional: She is oriented to person, place, and time. She appears well-developed and well-nourished. No distress.  Pear shaped obesity  HENT:  Head: Normocephalic.  Mouth/Throat: Oropharynx is clear and moist. No oropharyngeal exudate.  Eyes: Conjunctivae and EOM are normal. Pupils are equal, round,  and reactive to light. No scleral icterus.  Neck: Normal range of motion. Neck supple. No tracheal deviation present.  Cardiovascular: Normal rate, regular rhythm and intact distal pulses.  Pulmonary/Chest: Effort normal and breath sounds normal. No respiratory distress. She exhibits no tenderness.  Abdominal: Soft. She exhibits no distension and no mass. There is no tenderness. There is no rebound and no CVA tenderness. A hernia is present. Hernia confirmed positive in the ventral area. Hernia confirmed negative in the right inguinal area and confirmed negative in the left inguinal area.  RLQ [place] reducible  Obese.  Genitourinary: No vaginal discharge found.  Musculoskeletal: Normal range of motion. She exhibits no tenderness.  Lymphadenopathy:  She has no cervical adenopathy.  Right: No inguinal adenopathy present.  Left: No inguinal adenopathy present.  Neurological: She is alert and oriented to person, place, and  time. No cranial nerve deficit. She exhibits normal muscle tone. Coordination normal.  Skin: Skin is warm and dry. No rash noted. She is not diaphoretic. No erythema.  Psychiatric: She has a normal mood and affect. Her behavior is normal. Judgment and thought content normal.  Assessment:   [place] RLQ in morbidly obese pt chronically immunosuppressed with prior pilonidal infections  Plan:   She is above average risk for infection given her pilonidal drainage. However that is improved and stable now. Her Crohn's disease is stable so hopefully she will not develop any fistulae or other problems.  Given her morbid obesity, I do not think that primary. Will be sufficient. She required mesh. Probably a larger sheet given her risk factors. I think is reasonable start out laparoscopically provided to her not to severe adhesions. Undecided using a ultra light weight polypropylene versus parietex depending on how things go. She is interested in repair.  The anatomy & physiology of the abdominal  wall was discussed. The pathophysiology of hernias was discussed. Natural history risks without surgery including progeressive enlargement, pain, incarceration & strangulation was discussed. Contributors to complications such as smoking, obesity, diabetes, prior surgery, etc were discussed.  I feel the risks of no intervention will lead to serious problems that outweigh the operative risks; therefore, I recommended surgery to reduce and repair the hernia. I explained laparoscopic techniques with possible need for an open approach. I noted the probable use of mesh to patch and/or buttress the hernia repair  Risks such as bleeding, infection, abscess, need for further treatment, heart attack, death, and other risks were discussed. I noted a good likelihood this will help address the problem. Goals of post-operative recovery were discussed as well. Possibility that this will not correct all symptoms was explained. I stressed the importance of low-impact activity, aggressive pain control, avoiding constipation, & not pushing through pain to minimize risk of post-operative chronic pain or injury. Possibility of reherniation especially with smoking, obesity, diabetes, immunosuppression, and other health conditions was discussed. We will work to minimize complications.  An educational handout further explaining the pathology & treatment options was given as well. Questions were answered. The patient expresses understanding & wishes to proceed with surgery.  I have re-reviewed the the patient's records, history, medications, and allergies.  I have re-examined the patient.  I again discussed intraoperative plans and goals of post-operative recovery.  The patient agrees to proceed.    -VTE prophylaxis- SCDs, etc -mobilize as tolerated to help recovery  [name], M.D., F.A.C.S. Gastrointestinal and Minimally Invasive Surgery [facility] [address], [place], [place]  [id number] [phone] Main / Paging [phone] Voice Mail   11/27/2011

## 2011-11-28 ENCOUNTER — Encounter (HOSPITAL_COMMUNITY): Payer: Self-pay | Admitting: Surgery

## 2011-11-28 NOTE — Discharge Summary (Signed)
Physician Discharge Summary  Patient ID: Lilianne Delair MRN: [id number] DOB/AGE: 30-Aug-1980 31 y.o.  Admit date: 11/27/2011 Discharge date: 11/28/2011  Patient Care Team: Burnis Medin, MD as PCP - General Milus Banister, MD (Gastroenterology) Delice Lesch, [name], MD (Rheumatology)   Admission Diagnoses: Principal Problem:  *Incisional hernia, RLQ Active Problems:  CROHN'S DISEASE  Morbid obesity with body mass index of 40.0-44.9 in adult  Discharge Diagnoses:  Principal Problem:  *Incisional hernia, RLQ Active Problems:  CROHN'S DISEASE  Morbid obesity with body mass index of 40.0-44.9 in adult   Discharged Condition: good  Hospital Course: Pleasant morbidly obese female with Crohn's disease.  Underwent ileocecal resection a few years ago.  Developed an incisional hernia.  I recommended repair and she agreed.  She underwent laparoscopic lysis lesions and repair.  Mesh was placed.  She was rather sore required overnight observation.  By the time of discharge she was walking the hallways, urinating fine, tolerating solid food, pain was controlled with oral medication as well as heat and the On-Q pump.  Therefore I felt it was safe for her to be discharged home.  Care of and removal of On-Q catheter was discussed.  Dressing care was discussed.  We will follow her closely.  Instructions were discussed and written.  Consults: None  Significant Diagnostic Studies:   Treatments: surgery: Lap VEH repair w mesh  Discharge Exam: Blood pressure 129/85, pulse 83, temperature 98.3 F (36.8 C), temperature source Oral, resp. rate 18, height 5' 2.5" (1.588 m), weight 244 lb 11.4 oz (111 kg), last menstrual period 11/23/2011, SpO2 98.00%.  General: Pt awake/alert/oriented x4 in no major acute distress Eyes: PERRL, normal EOM. Sclera nonicteric Neuro: CN II-XII intact w/o focal sensory/motor deficits. Lymph: No head/neck/groin  lymphadenopathy Psych:  No delerium/psychosis/paranoia HENT: Normocephalic, Mucus membranes moist.  No thrush.  Mild hoarseness at first - improved w cough Neck: Supple, No tracheal deviation Chest: No pain.  Good respiratory excursion. CV:  Pulses intact.  Regular rhythm Abdomen: Soft, Nondistended.  Mild/mod tender at incisions.  No incarcerated hernias.  Binder & ONQ cath in place Ext:  SCDs BLE.  No significant edema.  No cyanosis Skin: No petechiae / purpurae   Disposition: 01-Home or Self Care  Discharge Orders    Future Orders Please Complete By Expires   Diet - low sodium heart healthy      Increase activity slowly        Medication List  As of 11/28/2011  7:18 AM   TAKE these medications         diphenhydrAMINE 25 MG tablet   Commonly known as: BENADRYL   Take 25 mg by mouth every 6 (six) hours as needed. allergies      drospirenone-ethinyl estradiol 3-0.02 MG tablet   Commonly known as: YAZ,GIANVI,LORYNA   Take 1 tablet by mouth daily.      flintstones complete 60 MG chewable tablet   Chew 1 tablet by mouth daily.      loratadine 10 MG tablet   Commonly known as: CLARITIN   Take 10 mg by mouth daily.      oxyCODONE 5 MG immediate release tablet   Commonly known as: Oxy IR/ROXICODONE   Take 1-2 tablets (5-10 mg total) by mouth every 4 (four) hours as needed for pain.      REMICADE 100 MG injection   Generic drug: inFLIXimab   Inject 10 mg/kg into the vein every 8 (eight) weeks.  tetrahydrozoline 0.05 % ophthalmic solution   Place 1 drop into both eyes as needed. Red eyes           Follow-up Information    Follow up with Dashea Mcmullan C., MD. Schedule an appointment as soon as possible for a visit in 3 weeks.   Contact information:   [facility] [address] [place] [phone]          Signed: Adin Hector. 11/28/2011, 7:18 AM

## 2011-11-28 NOTE — Progress Notes (Signed)
Pt instructed on how to remove On-Q pump after discharge from the hospital. Pt verbalized understanding. No comments or questions at this time. Pt understands how to check the On-Q pump and verbalized how to tell when it is empty and ready for removal.

## 2011-12-18 ENCOUNTER — Ambulatory Visit (INDEPENDENT_AMBULATORY_CARE_PROVIDER_SITE_OTHER): Payer: BC Managed Care – PPO | Admitting: Surgery

## 2011-12-18 ENCOUNTER — Encounter (INDEPENDENT_AMBULATORY_CARE_PROVIDER_SITE_OTHER): Payer: Self-pay | Admitting: Surgery

## 2011-12-18 VITALS — BP 126/72 | HR 84 | Temp 97.0°F | Resp 16 | Ht 62.5 in | Wt 238.4 lb

## 2011-12-18 DIAGNOSIS — K432 Incisional hernia without obstruction or gangrene: Secondary | ICD-10-CM

## 2011-12-18 NOTE — Patient Instructions (Addendum)
HERNIA REPAIR: POST OP INSTRUCTIONS  1. DIET: Follow a light bland diet the first 24 hours after arrival home, such as soup, liquids, crackers, etc.  Be sure to include lots of fluids daily.  Avoid fast food or heavy meals as your are more likely to get nauseated.  Eat a low fat the next few days after surgery. 2. Take your usually prescribed home medications unless otherwise directed. 3. PAIN CONTROL: a. Pain is best controlled by a usual combination of three different methods TOGETHER: i. Ice/Heat ii. Over the counter pain medication iii. Prescription pain medication b. Most patients will experience some swelling and bruising around the hernia(s) such as the bellybutton, groins, or old incisions.  Ice packs or heating pads (30-60 minutes up to 6 times a day) will help. Use ice for the first few days to help decrease swelling and bruising, then switch to heat to help relax tight/sore spots and speed recovery.  Some people prefer to use ice alone, heat alone, alternating between ice & heat.  Experiment to what works for you.  Swelling and bruising can take several weeks to resolve.   c. It is helpful to take an over-the-counter pain medication regularly for the first few weeks.  Choose one of the following that works best for you: i. Naproxen (Aleve, etc)  Two 279m tabs twice a day ii. Ibuprofen (Advil, etc) Three 2073mtabs four times a day (every meal & bedtime) iii. Acetaminophen (Tylenol, etc) 325-65040mour times a day (every meal & bedtime) d. A  prescription for pain medication should be given to you upon discharge.  Take your pain medication as prescribed.  i. If you are having problems/concerns with the prescription medicine (does not control pain, nausea, vomiting, rash, itching, etc), please call us [name] see if we need to switch you to a different pain medicine that will work better for you and/or control your side effect better. ii. If you need a refill on your pain  medication, please contact your pharmacy.  They will contact our office to request authorization. Prescriptions will not be filled after 5 pm or on week-ends. 4. Avoid getting constipated.  Between the surgery and the pain medications, it is common to experience some constipation.  Increasing fluid intake and taking a fiber supplement (such as Metamucil, Citrucel, FiberCon, MiraLax, etc) 1-2 times a day regularly will usually help prevent this problem from occurring.  A mild laxative (prune juice, Milk of Magnesia, MiraLax, etc) should be taken according to package directions if there are no bowel movements after 48 hours.   5. Wash / shower every day.  You may shower over the dressings as they are waterproof.   6. Remove your waterproof bandages 5 days after surgery.  You may leave the incision open to air.  You may replace a dressing/Band-Aid to cover the incision for comfort if you wish.  Continue to shower over incision(s) after the dressing is off.    7. ACTIVITIES as tolerated:   a. You may resume regular (light) daily activities beginning the next day-such as daily self-care, walking, climbing stairs-gradually increasing activities as tolerated.  If you can walk 30 minutes without difficulty, it is safe to try more intense activity such as jogging, treadmill, bicycling, low-impact aerobics, swimming, etc. b. Save the most intensive and strenuous activity for last such as sit-ups, heavy lifting, contact sports, etc  Refrain from any heavy lifting or straining until you are off narcotics for pain control.  c. DO NOT PUSH THROUGH PAIN.  Let pain be your guide: If it hurts to do something, don't do it.  Pain is your body warning you to avoid that activity for another week until the pain goes down. d. You may drive when you are no longer taking prescription pain medication, you can comfortably wear a seatbelt, and you can safely maneuver your car and apply brakes. e. Dennis Bast may have sexual intercourse  when it is comfortable.  8. FOLLOW UP in our office a. Please call [facility] at [phone] to set up an appointment to see your surgeon in the office for a follow-up appointment approximately 2-3 weeks after your surgery. b. Make sure that you call for this appointment the day you arrive home to insure a convenient appointment time. 9.  IF YOU HAVE DISABILITY OR FAMILY LEAVE FORMS, BRING THEM TO THE OFFICE FOR PROCESSING.  DO NOT GIVE THEM TO YOUR DOCTOR.  WHEN TO CALL us [phone]: 1. Poor pain control 2. Reactions / problems with new medications (rash/itching, nausea, etc)  3. Fever over 101.5 F (38.5 C) 4. Inability to urinate 5. Nausea and/or vomiting 6. Worsening swelling or bruising 7. Continued bleeding from incision. 8. Increased pain, redness, or drainage from the incision   The clinic staff is available to answer your questions during regular business hours (8:30am-5pm).  Please don't hesitate to call and ask to speak to one of our nurses for clinical concerns.   If you have a medical emergency, go to the nearest emergency room or call 911.  A surgeon from [facility] is always on call at the hospitals in [facility], [place], [place], [place], [place]  [address] ?  [address] [address], [place], [place]   [address] MAIN: [phone] ? TOLL FREE: [phone] ? FAX: [phone] [email or web address]

## 2011-12-18 NOTE — Progress Notes (Signed)
Subjective:     Patient ID: [name], female   DOB: 04-Oct-1980, 31 y.o.   MRN: [id number]  HPI   [name]  1980/12/09 [id number]  Patient Care Team: Burnis Medin, MD as PCP - General Milus Banister, MD (Gastroenterology) Delice Lesch, [name], MD (Rheumatology)  This patient is a 31 y.o.female who presents today for surgical evaluation at the request of Dr. Regis Bill.   Reason for visit: f/u s/p lap repair of RLQ incisional hernia. 11/27/2011  Patient is a pleasant morbidly obese female with Crohn's disease followed by Dr. Ardis Hughs. She gets Remicade every 2 months. Her disease is more stable. She does have a history of recurrent pilonidal infections. Last drained year ago. No new skin infections. She required urgent ileocecostomy 2011 by my partner Dr. Zenia Resides whom has since left the practice. Patient developed a hernia at her incision.   I repaired it laparoscopically.  It was low so I did do takedown her bladder and retackthat up.  She is able to self-remove the On-Q catheters out.  She is now three weeks out.  She notes she feels well.  Close to regular activity.  Off narcotics.  No fevers or chills.  Urinating fine.  Regular bowel movements.  Patient Active Problem List  Diagnosis  . FIBROIDS, UTERUS  . PERSISTENT DISORDER INIT/MAINTAINING WAKEFULNESS  . TONSILLAR HYPERTROPHY  . CROHN'S DISEASE  . ECZEMA  . ERYTHEMA NODOSUM  . THYROID FUNCTION TEST, ABNORMAL  . UNSPECIFIED SLEEP DISTURBANCE  . Left wrist pain  . Back pain  . External otitis  . Voiding dysfunction  . Morbid obesity with body mass index of 40.0-44.9 in adult  . Hypoalbuminemia  . Incisional hernia, RLQ    Past Medical History  Diagnosis Date  . Chronic rhinitis   . Acute swimmers' ear   . Contact dermatitis and other eczema, due to unspecified cause   . Regional enteritis of small intestine   . Rectal abscess   . Mononucleosis   . Obesity   .  Pneumonia   . Pilonidal abscess   . Fibroids 11-15-11    remains with fibroids  . Crohn's 11-15-11    no issues in 2 yrs  . Hernia     Past Surgical History  Procedure Date  . Knee arthroscopy 99, 02    Bil.  [name] Appendectomy 7/06  . Ileocecal resection 1/11    crohns disease with fisula/stricture. Dr. Ronnald Collum  . Pilonidal cyst excision 2011, 2012    I&Ds  . Eye surgery 11-15-11    2003-multiple stys  . Ventral hernia repair 11/27/2011    Procedure: LAPAROSCOPIC VENTRAL HERNIA;  Surgeon: Adin Hector, MD;  Location: [facility];  Service: General;  Laterality: N/A;  Laparoscopic Exploration & Repair of Hernia in Abdomen    History   Social History  . Marital Status: Single    Spouse Name: N/A    Number of Children: N/A  . Years of Education: N/A   Occupational History  . math teacher    Social History Main Topics  . Smoking status: Never Smoker   . Smokeless tobacco: Never Used  . Alcohol Use: No  . Drug Use: No  . Sexually Active: Not on file   Other Topics Concern  . 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 Aneurysm Mother     brain  . Heart disease Mother 68    heart anyrism  . Diabetes Sister     half sister  . Bipolar disorder Sister     half sister  . Colon cancer Maternal Grandmother   . Stomach cancer Paternal Grandfather   . Multiple sclerosis Sister   . Healthy Father     fairly    Current Outpatient Prescriptions  Medication Sig Dispense Refill  . diphenhydrAMINE (BENADRYL) 25 MG tablet Take 25 mg by mouth every 6 (six) hours as needed. allergies      . drospirenone-ethinyl estradiol (YAZ,GIANVI,LORYNA) 3-0.02 MG tablet Take 1 tablet by mouth daily.        . flintstones complete (FLINTSTONES) 60 MG chewable tablet Chew 1  tablet by mouth daily.      [name] inFLIXimab (REMICADE) 100 MG injection Inject 10 mg/kg into the vein every 8 (eight) weeks.       [name] loratadine (CLARITIN) 10 MG tablet Take 10 mg by mouth daily.       [name] tetrahydrozoline 0.05 % ophthalmic solution Place 1 drop into both eyes as needed. Red eyes         No Known Allergies  BP 126/72  Pulse 84  Temp 97 F (36.1 C) (Temporal)  Resp 16  Ht 5' 2.5" (1.588 m)  Wt 238 lb 6.4 oz (108.138 kg)  BMI 42.91 kg/m2  LMP 11/25/2011     Review of Systems  Constitutional: Negative for fever, chills, diaphoresis, appetite change and fatigue.  HENT: Negative for ear pain, sore throat, trouble swallowing, neck pain and ear discharge.   Eyes: Negative for photophobia, discharge and visual disturbance.  Respiratory: Negative for cough, choking, chest tightness and shortness of breath.   Cardiovascular: Negative for chest pain and palpitations.       Patient walks 30 minutes for about 1 miles without difficulty.  No exertional chest/neck/shoulder/arm pain.   Gastrointestinal: Negative for nausea, vomiting, abdominal pain, diarrhea, constipation, blood in stool, abdominal distention, anal bleeding and rectal pain.       No personal nor family history of GI/colon cancer, irritable bowel syndrome, allergy such as Celiac Sprue, dietary/dairy problems, colitis, ulcers nor gastritis.    BM 2-3x/day  No recent sick contacts/gastroenteritis.  No travel outside the country.  No changes in diet.    Genitourinary: Negative for dysuria, frequency and difficulty urinating.  Musculoskeletal: Negative for myalgias and gait problem.  Skin: Negative for color change, pallor and rash.  Neurological: Negative for dizziness, speech difficulty, weakness and numbness.  Hematological: Negative for adenopathy.  Psychiatric/Behavioral: Negative for confusion and agitation. The patient is not nervous/anxious.        Objective:   Physical Exam  Constitutional: She is  oriented to person, place, and time. She appears well-developed and well-nourished. No distress.       Pear shaped obesity  HENT:  Head: Normocephalic.  Mouth/Throat: Oropharynx is clear and moist. No oropharyngeal exudate.  Eyes: Conjunctivae and EOM are normal. Pupils are equal, round, and reactive to light. No scleral icterus.  Neck: Normal range of motion. Neck supple. No tracheal deviation present.  Cardiovascular: Normal rate, regular rhythm and intact distal pulses.   Pulmonary/Chest: Effort normal and breath sounds normal. No respiratory distress. She exhibits no tenderness.  Abdominal: Soft. She exhibits no distension and no mass. There is no tenderness. There  is no rebound and no CVA tenderness. No hernia. Hernia confirmed negative in the ventral area, confirmed negative in the right inguinal area and confirmed negative in the left inguinal area.         Obese.    Genitourinary: No vaginal discharge found.  Musculoskeletal: Normal range of motion. She exhibits no tenderness.  Lymphadenopathy:    She has no cervical adenopathy.       Right: No inguinal adenopathy present.       Left: No inguinal adenopathy present.  Neurological: She is alert and oriented to person, place, and time. No cranial nerve deficit. She exhibits normal muscle tone. Coordination normal.  Skin: Skin is warm and dry. No rash noted. She is not diaphoretic. No erythema.  Psychiatric: She has a normal mood and affect. Her behavior is normal. Judgment and thought content normal.       Assessment:     [place] RLQ in morbidly obese pt chronically immunosuppressed with prior pilonidal infections 3 weeks s/p lap repair, recovering well    Plan:     Increase activity as tolerated.  Do not push through pain.  Advanced on diet as tolerated. Bowel regimen to avoid problems.  Weight loss to minimize recurrence.  Return to clinic p.r.n. The patient expressed understanding and appreciation

## 2012-01-02 ENCOUNTER — Telehealth (INDEPENDENT_AMBULATORY_CARE_PROVIDER_SITE_OTHER): Payer: Self-pay | Admitting: General Surgery

## 2012-01-02 NOTE — Telephone Encounter (Signed)
[name], at Dr. Elza Rafter office, calling to ask if pt needed pre-medication to have her teeth cleaned.  They were concerned for the mesh implanted during her surgery.  FAXed okay to proceed without pre-meds:  [phone].

## 2012-07-28 LAB — HM PAP SMEAR: HM Pap smear: NORMAL

## 2012-09-26 ENCOUNTER — Ambulatory Visit (INDEPENDENT_AMBULATORY_CARE_PROVIDER_SITE_OTHER): Payer: BC Managed Care – PPO | Admitting: Internal Medicine

## 2012-09-26 ENCOUNTER — Encounter: Payer: Self-pay | Admitting: Internal Medicine

## 2012-09-26 VITALS — BP 114/70 | HR 91 | Temp 97.7°F | Wt 231.0 lb

## 2012-09-26 DIAGNOSIS — Z79899 Other long term (current) drug therapy: Secondary | ICD-10-CM

## 2012-09-26 DIAGNOSIS — K5090 Crohn's disease, unspecified, without complications: Secondary | ICD-10-CM

## 2012-09-26 DIAGNOSIS — J069 Acute upper respiratory infection, unspecified: Secondary | ICD-10-CM

## 2012-09-26 DIAGNOSIS — J351 Hypertrophy of tonsils: Secondary | ICD-10-CM

## 2012-09-26 DIAGNOSIS — J040 Acute laryngitis: Secondary | ICD-10-CM

## 2012-09-26 MED ORDER — DOXYCYCLINE HYCLATE 100 MG PO CAPS: 100.0000 mg | ORAL_CAPSULE | Freq: Two times a day (BID) | ORAL | Status: DC

## 2012-09-26 NOTE — Patient Instructions (Addendum)
This is a respiratory infection. It is most likely viral however if this is persistent or progressive would add antibiotics for possible bacterial sinusitis.  If your symptoms are persistent past 10-12 days without improvement or get face pain can begin the antibiotic .   If you get a fever contact the on-call service.  Saline nose spray decongestants in the day increased humidity plain Mucinex to try to break up the mucus could be helpful for comfort.  Hoarseness is just a sign of the congestion regarding the respiratory infection. Fortunately your lungs are clear today in your oxygen level is normal

## 2012-09-26 NOTE — Progress Notes (Signed)
Chief Complaint  Patient presents with  . Sore Throat    Sputum is yellow.  Has tried some otc dayquil, cough drops and robitussin  . Nasal Congestion  . Post nasal congestion  . Cough    HPI: Patient comes in today for SDA for  new problem evaluation. Last week  Sore throat and  Tonsils swollen  Got better  Then had urio sx and teaching and hard to talk and having yellow mucous.  Took today.  Throat feels fine right is always more swollen. Fever at onset not this week.  No cp but sore after coughing and getting trapped. Ongoing thick nasal drainage postnasal drainage cloudy and discolored. No specific face pain chest pain or shortness of breath  Her Crohn's disease is stable has done pretty well since hernia surgery last year. ROS: See pertinent positives and negatives per HPI. No active wheezing vomiting syncope  Past Medical History  Diagnosis Date  . Chronic rhinitis   . Acute swimmers' ear   . Contact dermatitis and other eczema, due to unspecified cause   . Regional enteritis of small intestine   . Rectal abscess   . Mononucleosis   . Obesity   . Pneumonia   . Pilonidal abscess   . Fibroids 11-15-11    remains with fibroids  . Crohn's 11-15-11    no issues in 2 yrs  . Hernia   . Incisional hernia, RLQ 10/02/2011    Family History  Problem Relation Age of Onset  . Aneurysm Mother     brain  . Heart disease Mother 7    heart anyrism  . Diabetes Sister     half sister  . Bipolar disorder Sister     half sister  . Colon cancer Maternal Grandmother   . Stomach cancer Paternal Grandfather   . Multiple sclerosis Sister   . Healthy Father     fairly    History   Social History  . Marital Status: Single    Spouse Name: N/A    Number of Children: N/A  . Years of Education: N/A   Occupational History  . math teacher    Social History Main Topics  . Smoking status: Never Smoker   . Smokeless tobacco: Never Used  . Alcohol Use: No  . Drug Use: No  .  Sexually Active: None   Other Topics Concern  . 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 diphenhydrAMINE (BENADRYL) 25 MG tablet Take 25 mg by mouth every 6 (six) hours as needed. allergies      . drospirenone-ethinyl estradiol (YAZ,GIANVI,LORYNA) 3-0.02 MG tablet Take 1 tablet by mouth daily.        . flintstones complete (FLINTSTONES) 60 MG chewable tablet Chew 1 tablet by mouth daily.      [name] inFLIXimab (REMICADE) 100 MG injection Inject 10 mg/kg into the vein every 8 (eight) weeks.       [name] loratadine (CLARITIN) 10 MG tablet Take 10 mg by mouth daily.       [name] doxycycline (VIBRAMYCIN) 100 MG capsule Take 1 capsule (100 mg total) by mouth 2 (two) times daily.  20 capsule  0  . [DISCONTINUED] tetrahydrozoline 0.05 % ophthalmic solution Place 1 drop into both eyes as needed. Red eyes       No facility-administered encounter medications on file as of 09/26/2012.    EXAM:  BP 114/70  Pulse 91  Temp(Src) 97.7 F (36.5 C) (Oral)  Wt 231 lb (104.781 kg)  BMI 41.55 kg/m2  SpO2 98%  LMP 09/26/2012  Body mass index is 41.55 kg/(m^2).  `WDWN in NAD  quiet respirations; mildly congested  somewhat hoarse. Non toxic . Is mouth breathing HEENT: Normocephalic ;atraumatic , Eyes;  PERRL, EOMs  Full, lids and conjunctiva clear,,Ears: no deformities, canals nl, TM landmarks normal, Nose: no deformity or some discharge and congested;face minimally tender Mouth : OP clear without lesion or edema . Tonsils +2 to +3 asymmetrical right greater than left no exudate Neck: Supple without adenopathy or masses or bruits Chest:  Clear to A&P without wheezes rales or rhonchi CV:  S1-S2 no gallops or murmurs peripheral perfusion is  normal Skin :nl perfusion and no acute rashes  ses on inspection palpation  MS: moves all extremities without noticeable focal  abnormality  PSYCH: pleasant and cooperative, no obvious depression or anxiety  ASSESSMENT AND PLAN:  Discussed the following assessment and plan:  URI, acute - Possible early sinusitis at risk  Acute laryngitis  TONSILLAR HYPERTROPHY  CROHN'S DISEASE  High risk medication use - Remicade Expected management and symptomatic treatment if persistent and progressive and asked like sinusitis she can add antibiotic.  currently I am avoiding Augmentin right now because of her history of Crohn's disease and what to avoid diarrhea from the medication. She is on high-risk medication close observation is warranted -Patient advised to return or notify health care team  if symptoms worsen or persist or new concerns arise.  Patient Instructions  This is a respiratory infection. It is most likely viral however if this is persistent or progressive would add antibiotics for possible bacterial sinusitis.  If your symptoms are persistent past 10-12 days without improvement or get face pain can begin the antibiotic .   If you get a fever contact the on-call service.  Saline nose spray decongestants in the day increased humidity plain Mucinex to try to break up the mucus could be helpful for comfort.  Hoarseness is just a sign of the congestion regarding the respiratory infection. Fortunately your lungs are clear today in your oxygen level is normal     Mariann Laster K. Greyden Besecker M.D.

## 2012-11-17 ENCOUNTER — Ambulatory Visit (INDEPENDENT_AMBULATORY_CARE_PROVIDER_SITE_OTHER): Payer: BC Managed Care – PPO | Admitting: Gastroenterology

## 2012-11-17 ENCOUNTER — Other Ambulatory Visit (INDEPENDENT_AMBULATORY_CARE_PROVIDER_SITE_OTHER): Payer: BC Managed Care – PPO

## 2012-11-17 ENCOUNTER — Encounter: Payer: Self-pay | Admitting: Gastroenterology

## 2012-11-17 VITALS — BP 134/62 | HR 95 | Ht 62.5 in | Wt 231.0 lb

## 2012-11-17 DIAGNOSIS — K5090 Crohn's disease, unspecified, without complications: Secondary | ICD-10-CM

## 2012-11-17 DIAGNOSIS — Z23 Encounter for immunization: Secondary | ICD-10-CM

## 2012-11-17 DIAGNOSIS — K50919 Crohn's disease, unspecified, with unspecified complications: Secondary | ICD-10-CM

## 2012-11-17 LAB — CALCIUM: Calcium: 9 mg/dL (ref 8.4–10.5)

## 2012-11-17 MED ORDER — DIPHENHYDRAMINE HCL 25 MG PO CAPS: 25.0000 mg | ORAL_CAPSULE | ORAL | Status: DC

## 2012-11-17 MED ORDER — INFLIXIMAB 100 MG IV SOLR: 5.0000 mg/kg | INTRAVENOUS | Status: AC

## 2012-11-17 MED ORDER — ACETAMINOPHEN ER 650 MG PO TBCR: 650.0000 mg | EXTENDED_RELEASE_TABLET | ORAL | Status: DC

## 2012-11-17 NOTE — Progress Notes (Signed)
Review of gastrointestinal problems:  1. Crohn ileitis. Presented like an acute appendicitis 2006. Surgery, not in [place], was much more suggestive of Crohn's disease. She did not respond to immunomodulators and so eventually she Began Remicade April 2007 While continuing on azathioprine. Early 2008, stopped azathioprine and she tolerated solo maintenance Remicade (5 mg per kilogram) well. April 2009 continues to tolerate Remicade infusions very well, getting her infusions at Dr. Tanna Furry office. 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 Her Remicade dosing was increased to 10 mg per kilogram. Winter, 2010 presented with right lower quadrant abscess, clear fistula connection between small bowel and the abscess. 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 Infection was drained with interventional radiology, drain eventually pulled however abscess recurred within days and so right lower quadrant drain was replaced. January, 2011: she is on azathioprine 100 mg a day, Augmentin twice daily, planning for surgery late January. January, 2011: ileocecectomy performed for active disease, persistent fistula, abscess. April, 2012 doing well on Remicade every 8 weeks.  2. May, 2011: polynidol cyst drained by urgent care; drained another time 2011. eventually surgically resected spring 2012    HPI: This is a    very pleasant 32 year old woman whom I last saw about 18 months ago.  She was just engaged to be married last month  Last seen 18 months ago.  Hernia surgery with DR. Gross last summer.  No real sicknesses in past year.  REmicade every 2 months.  BMs twice per day, no over bleeding.    No significant abd pains.     Past Medical History  Diagnosis Date  . Chronic rhinitis   . Acute swimmers' ear   . Contact dermatitis and other eczema, due to unspecified cause    . Regional enteritis of small intestine   . Rectal abscess   . Mononucleosis   . Obesity   . Pneumonia   . Pilonidal abscess   . Fibroids 11-15-11    remains with fibroids  . Crohn's 11-15-11    no issues in 2 yrs  . Hernia   . Incisional hernia, RLQ 10/02/2011    Past Surgical History  Procedure Laterality Date  . Knee arthroscopy  99, 02    Bil.  [name] Appendectomy  7/06  . Ileocecal resection  1/11    crohns disease with fisula/stricture. Dr. Ronnald Collum  . Pilonidal cyst excision  2011, 2012    I&Ds  . Eye surgery  11-15-11    2003-multiple stys  . Ventral hernia repair  11/27/2011    Procedure: LAPAROSCOPIC VENTRAL HERNIA;  Surgeon: Adin Hector, MD;  Location: [facility];  Service: General;  Laterality: N/A;  Laparoscopic Exploration & Repair of Hernia in Abdomen    Current Outpatient Prescriptions  Medication Sig Dispense Refill  . diphenhydrAMINE (BENADRYL) 25 MG tablet Take 25 mg by mouth every 6 (six) hours as needed. allergies      . drospirenone-ethinyl estradiol (YAZ,GIANVI,LORYNA) 3-0.02 MG tablet Take 1 tablet by mouth daily.        . flintstones complete (FLINTSTONES) 60 MG chewable tablet Chew 1 tablet by mouth daily.      [name] inFLIXimab (REMICADE) 100 MG injection Inject 10 mg/kg into the vein every 8 (eight) weeks.        No current facility-administered medications for this visit.    Allergies as of 11/17/2012  . (No Known  Allergies)    Family History  Problem Relation Age of Onset  . Aneurysm Mother     brain  . Heart disease Mother 54    heart anyrism  . Diabetes Sister     half sister  . Bipolar disorder Sister     half sister  . Colon cancer Maternal Grandmother   . Stomach cancer Paternal Grandfather   . Multiple sclerosis Sister   . Healthy Father     fairly    History   Social History  . Marital Status: Single    Spouse Name: N/A    Number of Children: N/A  . Years of Education: N/A   Occupational History  . math teacher    Social  History Main Topics  . Smoking status: Never Smoker   . Smokeless tobacco: Never Used  . Alcohol Use: No  . Drug Use: No  . Sexually Active: Not on file   Other Topics Concern  . 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 female with Crohn's disease  She asked about backing off her Remicade from 10 mg per kilogram down to 5 mg per kilogram and I think that is a good idea and probably safe trial. We discussed immunizations and her bone health. She will get vitamin levels, calcium level today. DEXA scan today. She requires TB testing around now we will perform that for her. She will return to see me in 6 months and sooner if needed.

## 2012-11-17 NOTE — Patient Instructions (Addendum)
You will have labs checked today in the basement lab.  Please head down after you check out with the front desk  (calcium, vitamin D). Dexa scan for bone health. 11/18/12 1130 am [facility]. Continue getting flu shots every year. Pneumococcal vaccination today. Please return to see Dr. Ardis Hughs in 6 months. Will decrease your every two month remicade injection to 81m/kg. TB testing today. Please return on 11/19/12 Wednesday for TB skin test reading.                                               We are excited to introduce MyChart, a new best-in-class service that provides you online access to important information in your electronic medical record. We want to make it easier for you to view your health information - all in one secure location - when and where you need it. We expect MyChart will enhance the quality of care and service we provide.  When you register for MyChart, you can:    View your test results.    Request appointments and receive appointment reminders via email.    Request medication renewals.    View your medical history, allergies, medications and immunizations.    Communicate with your physician's office through a password-protected site.    Conveniently print information such as your medication lists.  To find out if MyChart is right for you, please talk to a member of our clinical staff today. We will gladly answer your questions about this free health and wellness tool.  If you are age 30 or older and want a member of your family to have access to your record, you must provide written consent by completing a proxy form available at our office. Please speak to our clinical staff about guidelines regarding accounts for patients younger than age 30  As you activate your MyChart account and need any technical assistance, please call the MyChart technical support line at [phone] or email your question to [email or web address]. If you email  your question(s), please include your name, a return phone number and the best time to reach you.  If you have non-urgent health-related questions, you can send a message to our office through [place] [email or web address] If you have a medical emergency, call 911.  Thank you for using MyChart as your new health and wellness resource!   MyChart licensed from [employer]  1999-2010. Patents Pending.

## 2012-11-18 ENCOUNTER — Other Ambulatory Visit: Payer: BC Managed Care – PPO

## 2012-11-18 LAB — VITAMIN D 25 HYDROXY (VIT D DEFICIENCY, FRACTURES): Vit D, 25-Hydroxy: 24 ng/mL — ABNORMAL LOW (ref 30–89)

## 2012-11-19 ENCOUNTER — Ambulatory Visit (INDEPENDENT_AMBULATORY_CARE_PROVIDER_SITE_OTHER)
Admission: RE | Admit: 2012-11-19 | Discharge: 2012-11-19 | Disposition: A | Discharge status: Home / Self Care | Payer: BC Managed Care – PPO | Source: Ambulatory Visit | Attending: Gastroenterology | Admitting: Gastroenterology

## 2012-11-19 DIAGNOSIS — K50919 Crohn's disease, unspecified, with unspecified complications: Secondary | ICD-10-CM

## 2012-11-19 DIAGNOSIS — K5090 Crohn's disease, unspecified, without complications: Secondary | ICD-10-CM

## 2012-11-19 LAB — TB SKIN TEST: TB Skin Test: NEGATIVE

## 2013-03-23 ENCOUNTER — Other Ambulatory Visit (INDEPENDENT_AMBULATORY_CARE_PROVIDER_SITE_OTHER): Payer: BC Managed Care – PPO

## 2013-03-23 DIAGNOSIS — Z0000 Encounter for general adult medical examination without abnormal findings: Secondary | ICD-10-CM

## 2013-03-23 LAB — CBC WITH DIFFERENTIAL/PLATELET
Basophils Relative: 0.4 % (ref 0.0–3.0)
Eosinophils Relative: 1.1 % (ref 0.0–5.0)
Hemoglobin: 11.7 g/dL — ABNORMAL LOW (ref 12.0–15.0)
Lymphocytes Relative: 35.6 % (ref 12.0–46.0)
MCV: 80.2 fl (ref 78.0–100.0)
Neutro Abs: 5.7 10*3/uL (ref 1.4–7.7)
Neutrophils Relative %: 54.3 % (ref 43.0–77.0)
RBC: 4.46 Mil/uL (ref 3.87–5.11)
WBC: 10.6 10*3/uL — ABNORMAL HIGH (ref 4.5–10.5)

## 2013-03-23 LAB — BASIC METABOLIC PANEL
Calcium: 8.7 mg/dL (ref 8.4–10.5)
Chloride: 105 mEq/L (ref 96–112)
Creatinine, Ser: 0.8 mg/dL (ref 0.4–1.2)
Sodium: 138 mEq/L (ref 135–145)

## 2013-03-23 LAB — HEPATIC FUNCTION PANEL
ALT: 13 U/L (ref 0–35)
Albumin: 2.9 g/dL — ABNORMAL LOW (ref 3.5–5.2)
Alkaline Phosphatase: 66 U/L (ref 39–117)
Bilirubin, Direct: 0 mg/dL (ref 0.0–0.3)
Total Protein: 7.4 g/dL (ref 6.0–8.3)

## 2013-03-23 LAB — LIPID PANEL
Cholesterol: 172 mg/dL (ref 0–200)
HDL: 68.1 mg/dL (ref >39.00)
LDL Cholesterol: 84 mg/dL (ref 0–99)
Total CHOL/HDL Ratio: 3
VLDL: 19.6 mg/dL (ref 0.0–40.0)

## 2013-03-31 ENCOUNTER — Ambulatory Visit (INDEPENDENT_AMBULATORY_CARE_PROVIDER_SITE_OTHER): Payer: BC Managed Care – PPO | Admitting: Internal Medicine

## 2013-03-31 ENCOUNTER — Encounter: Payer: Self-pay | Admitting: Internal Medicine

## 2013-03-31 VITALS — BP 114/70 | HR 86 | Temp 97.8°F | Ht 62.75 in | Wt 246.0 lb

## 2013-03-31 DIAGNOSIS — Z79899 Other long term (current) drug therapy: Secondary | ICD-10-CM

## 2013-03-31 DIAGNOSIS — E8809 Other disorders of plasma-protein metabolism, not elsewhere classified: Secondary | ICD-10-CM

## 2013-03-31 DIAGNOSIS — K5090 Crohn's disease, unspecified, without complications: Secondary | ICD-10-CM

## 2013-03-31 DIAGNOSIS — Z23 Encounter for immunization: Secondary | ICD-10-CM

## 2013-03-31 DIAGNOSIS — Z6841 Body Mass Index (BMI) 40.0 and over, adult: Secondary | ICD-10-CM

## 2013-03-31 DIAGNOSIS — R5381 Other malaise: Secondary | ICD-10-CM

## 2013-03-31 DIAGNOSIS — D649 Anemia, unspecified: Secondary | ICD-10-CM | POA: Insufficient documentation

## 2013-03-31 DIAGNOSIS — Z0000 Encounter for general adult medical examination without abnormal findings: Secondary | ICD-10-CM

## 2013-03-31 NOTE — Progress Notes (Signed)
Chief Complaint  Patient presents with  . Annual Exam    HPI: Patient comes in today for Preventive Health Care visit   On remicade infusions  To get one in about 10 days   hasn't had flu vaccine yet   Has gyne ;heavy at times .  On cyclic hormones   Few cramps.   More tired  Than normal  Sometimes after eating   Normal sleep normally . Snore? No osa   [place] .   Had lost weight and then gained with fast food visits  A little more tired than usual .   Sleep 6-7 hours   hsa / about travel. Risk of infection   ROS:  GEN/ HEENT: No fever, significant weight changes sweats headaches vision problems hearing changes, CV/ PULM; No chest pain shortness of breath cough, syncope,edema  change in exercise tolerance. GI /GU: No adominal pain, vomiting, change in bowel habits. No blood in the stool. No significant GU symptoms. SKIN/HEME: ,no acute skin rashes suspicious lesions or bleeding. No lymphadenopathy, nodules, masses.  NEURO/ PSYCH:  No neurologic signs such as weakness numbness. No depression anxiety. IMM/ Allergy: No unusual infections.  Allergy .   REST of 12 system review negative except as per HPI   Past Medical History  Diagnosis Date  . Chronic rhinitis   . Acute swimmers' ear   . Contact dermatitis and other eczema, due to unspecified cause   . Regional enteritis of small intestine   . Rectal abscess   . Mononucleosis   . Obesity   . Pneumonia   . Pilonidal abscess   . Fibroids 11-15-11    remains with fibroids  . Crohn's 11-15-11    no issues in 2 yrs  . Hernia   . Incisional hernia, RLQ 10/02/2011    Family History  Problem Relation Age of Onset  . Aneurysm Mother     brain  . Heart disease Mother 49    heart anyrism  . Diabetes Sister     half sister  . Bipolar disorder Sister     half sister  . Colon cancer Maternal Grandmother   . Stomach cancer Paternal Grandfather   . Multiple sclerosis Sister   . Healthy Father     fairly    History    Social History  . Marital Status: Single    Spouse Name: N/A    Number of Children: N/A  . Years of Education: N/A   Occupational History  . math teacher    Social History Main Topics  . Smoking status: Never Smoker   . Smokeless tobacco: Never Used  . Alcohol Use: No  . Drug Use: No  . Sexual Activity: None   Other Topics Concern  . 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 Calcium Carb-Cholecalciferol (CALCIUM 600 + D PO) Take 1 tablet by mouth daily.  . diphenhydrAMINE (BENADRYL) 25 MG tablet Take 25 mg by mouth every 6 (six) hours as needed. allergies  . drospirenone-ethinyl estradiol (YAZ,GIANVI,LORYNA) 3-0.02 MG tablet Take 1 tablet by mouth daily.    . flintstones complete (FLINTSTONES) 60 MG chewable tablet Chew 1 tablet  by mouth daily.    EXAM:  BP 114/70  Pulse 86  Temp(Src) 97.8 F (36.6 C) (Oral)  Ht 5' 2.75" (1.594 m)  Wt 246 lb (111.585 kg)  BMI 43.92 kg/m2  SpO2 98%  LMP 03/17/2013  Body mass index is 43.92 kg/(m^2).  Physical Exam: Vital signs reviewed [id number] is a well-developed well-nourished alert cooperative   female who appears her stated age in no acute distress.  HEENT: normocephalic atraumatic , Eyes: PERRL EOM's full, conjunctiva clear, Nares: paten,t no deformity discharge or tenderness., mild congestion Ears: no deformity EAC's clear TMs with normal landmarks. Mouth: clear OP, no lesions, edema.  Moist mucous membranes. Dentition in adequate repair. NECK: supple without masses, thyromegaly or bruits. CHEST/PULM:  Clear to auscultation and percussion breath sounds equal no wheeze , rales or rhonchi. No chest wall deformities or tenderness. Breast: normal by inspection . No  dimpling, discharge, masses, tenderness or discharge . CV: PMI is nondisplaced, S1 S2 no gallops, murmurs, rubs. Peripheral pulses are full without delay.No JVD .  ABDOMEN: Bowel sounds normal nontender  No guard or rebound, no hepato splenomegal no CVA tenderness.   Extremtities:  No clubbing cyanosis or edema, no acute joint swelling or redness no focal atrophy NEURO:  Oriented x3, cranial nerves 3-12 appear to be intact, no obvious focal weakness,gait within normal limits no abnormal reflexes or asymmetrical SKIN: No acute rashes normal turgor, color, no bruising or petechiae. PSYCH: Oriented, good eye contact, no obvious depression anxiety, cognition and judgment appear normal. LN: no cervical axillary iadenopathy  Lab Results  Component Value Date   WBC 10.6* 03/23/2013   HGB 11.7* 03/23/2013   HCT 35.8* 03/23/2013   PLT 376.0 03/23/2013   GLUCOSE 88 03/23/2013   CHOL 172 03/23/2013   TRIG 98.0 03/23/2013   HDL 68.10 03/23/2013   LDLCALC 84 03/23/2013   ALT 13 03/23/2013   AST 16 03/23/2013   NA 138 03/23/2013   K 4.7 03/23/2013   CL 105 03/23/2013   CREATININE 0.8 03/23/2013   BUN 11 03/23/2013   CO2 26 03/23/2013   TSH 1.57 03/23/2013   INR 1.47 05/26/2009    ASSESSMENT AND PLAN:  Discussed the following assessment and plan:  Visit for preventive health examination  Need for prophylactic vaccination and inoculation against influenza - Plan: Flu Vaccine QUAD 36+ mos PF IM (Fluarix)  Morbid obesity with body mass index of 40.0-44.9 in adult  Hypoalbuminemia  High risk medication use  CROHN'S DISEASE  Anemia - mild presumed iron def bleeding and poss abs issue consider continuous therapy ocps , fu in a few months  Other malaise and fatigue Disc flu vaccine best 2 weeks away form infusion however about 10 days pre . And if wait it will be in December and  Better to give now.  Patient Care Team: Burnis Medin, MD as PCP - General Milus Banister, MD  (Gastroenterology) Delice Lesch, [name], MD (Rheumatology) Patient Instructions  Flu vaccine usually best 14 days away rom remicade  Can return for 14 days after infusion.  You have mail anemia prob iron deficincy  Can take iron supplement each day  . If ok with dr Ardis Hughs  Some times not well absorbed .  Ask gyne about continuous hormonal therapy to lose less blood every month. And help the anemia .  Recheck cbcdiff in  3-4 months and fu visit when convenient.   weight loss will help your fatigue  also .   [name]. Jiro Kiester M.D. Health Maintenance  Topic Date Due  . Tetanus/tdap  06/28/2013  . Influenza Vaccine  12/26/2013  . Pap Smear  07/29/2015   Health Maintenance Review

## 2013-03-31 NOTE — Patient Instructions (Addendum)
Flu vaccine usually best 14 days away rom remicade  Can return for 14 days after infusion.  You have mail anemia prob iron deficincy  Can take iron supplement each day  . If ok with dr Ardis Hughs  Some times not well absorbed .  Ask gyne about continuous hormonal therapy to lose less blood every month. And help the anemia .  Recheck cbcdiff in  3-4 months and fu visit when convenient.   weight loss will help your fatigue also .

## 2013-04-03 ENCOUNTER — Telehealth: Payer: Self-pay

## 2013-04-03 NOTE — Telephone Encounter (Signed)
Left message on machine to call back  

## 2013-04-03 NOTE — Telephone Encounter (Signed)
[name], I think once daily iron supp would be fine. We'll get in touch with her to let her know. Thanks [name], Can you call [name]. She should start OTC iron supp, 341m, one pill once daily. Thanks ----- Message ----- From: WBurnis Medin MD Sent: 03/31/2013 6:37 PM To: DMilus Banister MD Please advise patient if can proceed with adding iron supplement for hermild anemia Thanks [facility]

## 2013-04-06 NOTE — Telephone Encounter (Signed)
Left message on machine to call back work and home

## 2013-04-06 NOTE — Telephone Encounter (Signed)
Left message on machine to call back  

## 2013-04-06 NOTE — Telephone Encounter (Signed)
Pt has been notified and will begin the iron supplement

## 2013-07-17 ENCOUNTER — Telehealth: Payer: Self-pay | Admitting: Gastroenterology

## 2013-07-17 NOTE — Telephone Encounter (Signed)
She should start OTC iron supp, 311m, one pill once daily.  Pt aware and thanked me for calling

## 2013-07-29 ENCOUNTER — Other Ambulatory Visit (INDEPENDENT_AMBULATORY_CARE_PROVIDER_SITE_OTHER): Payer: BC Managed Care – PPO

## 2013-07-29 DIAGNOSIS — D649 Anemia, unspecified: Secondary | ICD-10-CM

## 2013-07-29 LAB — IBC PANEL
Iron: 114 ug/dL (ref 42–145)
SATURATION RATIOS: 36.8 % (ref 20.0–50.0)
TRANSFERRIN: 221 mg/dL (ref 212.0–360.0)

## 2013-07-29 LAB — CBC WITH DIFFERENTIAL/PLATELET
BASOS PCT: 0.5 % (ref 0.0–3.0)
Basophils Absolute: 0 10*3/uL (ref 0.0–0.1)
EOS ABS: 0.1 10*3/uL (ref 0.0–0.7)
Eosinophils Relative: 1.2 % (ref 0.0–5.0)
HCT: 40.1 % (ref 36.0–46.0)
HEMOGLOBIN: 13.1 g/dL (ref 12.0–15.0)
LYMPHS ABS: 3.2 10*3/uL (ref 0.7–4.0)
LYMPHS PCT: 34.9 % (ref 12.0–46.0)
MCHC: 32.7 g/dL (ref 30.0–36.0)
MCV: 86.2 fl (ref 78.0–100.0)
Monocytes Absolute: 0.9 10*3/uL (ref 0.1–1.0)
Monocytes Relative: 9.7 % (ref 3.0–12.0)
NEUTROS ABS: 5 10*3/uL (ref 1.4–7.7)
Neutrophils Relative %: 53.7 % (ref 43.0–77.0)
Platelets: 337 10*3/uL (ref 150.0–400.0)
RBC: 4.65 Mil/uL (ref 3.87–5.11)
RDW: 19.2 % — ABNORMAL HIGH (ref 11.5–14.6)
WBC: 9.3 10*3/uL (ref 4.5–10.5)

## 2013-07-29 LAB — FERRITIN: Ferritin: 14.8 ng/mL (ref 10.0–291.0)

## 2013-08-05 ENCOUNTER — Encounter: Payer: BC Managed Care – PPO | Admitting: Internal Medicine

## 2013-08-05 ENCOUNTER — Encounter: Payer: Self-pay | Admitting: Internal Medicine

## 2013-08-05 NOTE — Progress Notes (Signed)
Document opened and reviewed for OV but appt  canceled same day .  

## 2013-08-31 ENCOUNTER — Ambulatory Visit (INDEPENDENT_AMBULATORY_CARE_PROVIDER_SITE_OTHER): Payer: BC Managed Care – PPO | Admitting: Internal Medicine

## 2013-08-31 ENCOUNTER — Encounter: Payer: Self-pay | Admitting: Internal Medicine

## 2013-08-31 VITALS — BP 100/60 | Temp 98.0°F | Ht 62.75 in | Wt 247.0 lb

## 2013-08-31 DIAGNOSIS — R51 Headache: Secondary | ICD-10-CM

## 2013-08-31 DIAGNOSIS — R519 Headache, unspecified: Secondary | ICD-10-CM

## 2013-08-31 DIAGNOSIS — D649 Anemia, unspecified: Secondary | ICD-10-CM

## 2013-08-31 DIAGNOSIS — T887XXA Unspecified adverse effect of drug or medicament, initial encounter: Secondary | ICD-10-CM

## 2013-08-31 NOTE — Patient Instructions (Signed)
Anemia is better.  Pay attention.  To bleeding and diet.  Recheck routine  Preventive visit in November or thereabouts.,

## 2013-08-31 NOTE — Progress Notes (Signed)
Chief Complaint  Patient presents with  . Follow-up    HPI: [name]  comes in today for follow up of  multiple medical problems.  anemia has crohns and iron defic few weeks  And had rectal pain and then gas pain and  Loose stools   But hard.    "Rectum on fire. " Tried for  Weeks and couldn't tolerate otherwise  Cycle  Bleeding  And to see on Friday    Energy some better  Eating not out as much  And cooking  .  No fast foods .  No tea  recent pressure sore back of head feel in br a while ago and didn't head  Has allergy sx taking allegra .  No neuro sx.  ROS: See pertinent positives and negatives per HPI.  Past Medical History  Diagnosis Date  . Chronic rhinitis   . Acute swimmers' ear   . Contact dermatitis and other eczema, due to unspecified cause   . Regional enteritis of small intestine   . Rectal abscess   . Mononucleosis   . Obesity   . Pneumonia   . Pilonidal abscess   . Fibroids 11-15-11    remains with fibroids  . Crohn's 11-15-11    no issues in 2 yrs  . Hernia   . Incisional hernia, RLQ 10/02/2011    Family History  Problem Relation Age of Onset  . Aneurysm Mother     brain  . Heart disease Mother 79    heart anyrism  . Diabetes Sister     half sister  . Bipolar disorder Sister     half sister  . Colon cancer Maternal Grandmother   . Stomach cancer Paternal Grandfather   . Multiple sclerosis Sister   . Healthy Father     fairly    History   Social History  . Marital Status: Single    Spouse Name: N/A    Number of Children: N/A  . Years of Education: N/A   Occupational History  . math teacher    Social History Main Topics  . Smoking status: Never Smoker   . Smokeless tobacco: Never Used  . Alcohol Use: No  . Drug Use: No  . Sexual Activity: None   Other Topics Concern  . 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 Calcium Carb-Cholecalciferol (CALCIUM 1000 + D) 1000-800 MG-UNIT TABS Take by mouth.  . diphenhydrAMINE (BENADRYL) 25 MG tablet Take 25 mg by mouth every 6 (six) hours as needed. allergies  . drospirenone-ethinyl estradiol (YAZ,GIANVI,LORYNA) 3-0.02 MG tablet Take 1 tablet by mouth daily.    . flintstones complete (FLINTSTONES) 60 MG chewable tablet Chew 1 tablet by mouth daily.  . [DISCONTINUED] Calcium Carb-Cholecalciferol (CALCIUM 600 + D PO) Take 1 tablet by mouth daily.    EXAM:  BP 100/60  Temp(Src) 98 F (36.7 C) (Oral)  Ht 5' 2.75" (1.594 m)  Wt 247 lb (112.038 kg)  BMI 44.09 kg/m2  Body mass index is 44.09 kg/(m^2). Wt Readings from Last 3 Encounters:  08/31/13 247 lb (112.038 kg)  03/31/13 246 lb (111.585 kg)  11/17/12 231  lb (104.781 kg)    GENERAL: vitals reviewed and listed above, alert, oriented, appears well hydrated and in no acute distress HEENT: atraumatic, conjunctiva  clear, no obvious abnormalities on inspection of external nose and earsNECK: no obvious masses on inspection palpation  LUNGS: clear to auscultation bilaterally, no wheezes, rales or rhonchi, good air movement CV: HRRR, no clubbing cyanosis or  peripheral edema nl cap refill  MS: moves all extremities without noticeable focal  abnormality PSYCH: pleasant and cooperative, no obvious depression or anxiety Lab Results  Component Value Date   WBC 9.3 07/29/2013   HGB 13.1 07/29/2013   HCT 40.1 07/29/2013   PLT 337.0 07/29/2013   GLUCOSE 88 03/23/2013   CHOL 172 03/23/2013   TRIG 98.0 03/23/2013   HDL 68.10 03/23/2013   LDLCALC 84 03/23/2013   ALT 13 03/23/2013   AST 16 03/23/2013   NA 138 03/23/2013   K 4.7 03/23/2013   CL 105 03/23/2013   CREATININE 0.8 03/23/2013   BUN 11 03/23/2013   CO2 26 03/23/2013   TSH 1.57 03/23/2013   INR 1.47 05/26/2009     ASSESSMENT AND PLAN:  Discussed the following assessment and plan:  Anemia, unspecified - iron def much better  didnt toelrate iron fortunately ok today   Medication side effect - iron sulfate  Disc other options if needed   Iron in future ie once a week etc.  Head pain  Do obvious sig issues   Follow  Non focal exam today loows well .  Has mild allergic [place]  -Patient advised to return or notify health care team  if symptoms worsen ,persist or new concerns arise.  Patient Instructions  Anemia is better.  Pay attention.  To bleeding and diet.  Recheck routine  Preventive visit in November or thereabouts.,    [name]. Panosh M.D.  Pre visit review using our clinic review tool, if applicable. No additional management support is needed unless otherwise documented below in the visit note.

## 2014-04-07 ENCOUNTER — Ambulatory Visit (INDEPENDENT_AMBULATORY_CARE_PROVIDER_SITE_OTHER): Payer: BC Managed Care – PPO | Admitting: Internal Medicine

## 2014-04-07 ENCOUNTER — Other Ambulatory Visit: Payer: Self-pay | Admitting: *Deleted

## 2014-04-07 ENCOUNTER — Encounter: Payer: Self-pay | Admitting: Internal Medicine

## 2014-04-07 ENCOUNTER — Ambulatory Visit: Payer: BC Managed Care – PPO | Admitting: Family Medicine

## 2014-04-07 VITALS — BP 130/60 | Temp 98.1°F | Wt 261.6 lb

## 2014-04-07 DIAGNOSIS — R300 Dysuria: Secondary | ICD-10-CM

## 2014-04-07 DIAGNOSIS — Z6841 Body Mass Index (BMI) 40.0 and over, adult: Secondary | ICD-10-CM

## 2014-04-07 DIAGNOSIS — R5383 Other fatigue: Secondary | ICD-10-CM

## 2014-04-07 DIAGNOSIS — D259 Leiomyoma of uterus, unspecified: Secondary | ICD-10-CM

## 2014-04-07 DIAGNOSIS — Z23 Encounter for immunization: Secondary | ICD-10-CM

## 2014-04-07 DIAGNOSIS — N398 Other specified disorders of urinary system: Secondary | ICD-10-CM

## 2014-04-07 LAB — POCT URINALYSIS DIPSTICK
BILIRUBIN UA: NEGATIVE
GLUCOSE UA: NEGATIVE
Ketones, UA: NEGATIVE
Leukocytes, UA: NEGATIVE
NITRITE UA: NEGATIVE
Protein, UA: NEGATIVE
RBC UA: NEGATIVE
SPEC GRAV UA: 1.02
Urobilinogen, UA: 0.2
pH, UA: 6

## 2014-04-07 NOTE — Patient Instructions (Signed)
No uti today based on urine test.  Sounds like ineffective sleep   Advise urology help with the bladder issues .    You will be contact about this

## 2014-04-07 NOTE — Progress Notes (Signed)
Pre visit review using our clinic review tool, if applicable. No additional management support is needed unless otherwise documented below in the visit note.  Chief Complaint  Patient presents with  . Fatigue    Feeling very tired.  Has trouble emptying her bladder or voiding.  . Trouble urinating    HPI: Patient [name]  comes in today for SDA for  new problem evaluation.listed as possible UTI but patient states it's really with changes in voiding over the last few months. She states that she goes frequently during the day but at night feels like she has to go but sleeps through it and then has a hard time voiding in the morning. She is a very large fibroid is to be removed by surgery but not until the summer.  She is become more sleepy and tired recently and not rested when she awakens bedtime 11:30 up at 6:30. Denies any snoring but she does have enlarged tonsils.  Sometimes she takes an extra nap. Crohn's diseases and flaring. Last laboratory tests were in April and they were stable without significant anemia but slightly low ferritin. Denies specific depression otherwise.  ROS: See pertinent positives and negatives per HPI.  Past Medical History  Diagnosis Date  . Chronic rhinitis   . Acute swimmers' ear   . Contact dermatitis and other eczema, due to unspecified cause   . Regional enteritis of small intestine   . Rectal abscess   . Mononucleosis   . Obesity   . Pneumonia   . Pilonidal abscess   . Fibroids 11-15-11    remains with fibroids  . Crohn's 11-15-11    no issues in 2 yrs  . Hernia   . Incisional hernia, RLQ 10/02/2011    Family History  Problem Relation Age of Onset  . Aneurysm Mother     brain  . Heart disease Mother 39    heart anyrism  . Diabetes Sister     half sister  . Bipolar disorder Sister     half sister  . Colon cancer Maternal Grandmother   . Stomach cancer Paternal Grandfather   . Multiple sclerosis Sister   . Healthy Father     fairly      History   Social History  . Marital Status: Single    Spouse Name: N/A    Number of Children: N/A  . Years of Education: N/A   Occupational History  . math teacher    Social History Main Topics  . Smoking status: Never Smoker   . Smokeless tobacco: Never Used  . Alcohol Use: No  . Drug Use: No  . Sexual Activity: None   Other Topics Concern  . 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 Calcium Carb-Cholecalciferol (CALCIUM 1000 + D) 1000-800 MG-UNIT TABS Take by mouth.  . diphenhydrAMINE (BENADRYL) 25 MG tablet Take 25 mg by mouth every 6 (six) hours as needed. allergies  . drospirenone-ethinyl estradiol (YAZ,GIANVI,LORYNA) 3-0.02 MG tablet Take 1 tablet by mouth daily.    . flintstones complete (FLINTSTONES) 60 MG chewable tablet Chew 1 tablet by mouth daily.    EXAM:  BP 130/60  mmHg  Temp(Src) 98.1 F (36.7 C) (Oral)  Wt 261 lb 9.6 oz (118.661 kg)  Body mass index is 46.7 kg/(m^2).  GENERAL: vitals reviewed and listed above, alert, oriented, appears well hydrated and in no acute distress looks somewhat tired  HEENT: atraumatic, conjunctiva  clear, no obvious abnormalities on inspection of external nose and ears   NECK: no obvious masses on inspection palpation  LUNGS: clear to auscultation bilaterally, no wheezes, rales or rhonchi, good air movement CV: HRRR, no clubbing cyanosis or  peripheral edema nl cap refill  Abdomen:  Sof,t normal bowel sounds without hepatosplenomegaly, no guarding rebound s no CVA tenderness prominent fibroid mass  noted non tender. Cannot tell size of bladder MS: moves all extremities without noticeable focal  abnormality PSYCH: pleasant and cooperative, no obvious depression  or anxiety Wt Readings from Last 3 Encounters:  04/07/14 261 lb 9.6 oz (118.661 kg)  08/31/13 247 lb (112.038 kg)  03/31/13 246 lb (111.585 kg)   UA clear    ASSESSMENT AND PLAN:  Discussed the following assessment and plan:  Dysuria - Plan: POC Urinalysis Dipstick  Voiding dysfunction - Plan: Ambulatory referral to Urology  Uterine leiomyoma, unspecified location  Morbid obesity with body mass index of 40.0-44.9 in adult  Other fatigue  Need for prophylactic vaccination and inoculation against influenza - Plan: Flu Vaccine QUAD 36+ mos PF IM (Fluarix Quad PF) Certainly fibroid could be adding to her urinary symptoms however they seem more like voiding dysfunction not related to simple pressure. Also complains of fatigue that she has had before although relapse of been okay in the last 6 months. I still suspect some kind of abnormal sleeping although she denies sleep apnea. Would do the urology consult first she is due for her usual GYN surgery until next summer. Sitter further workup regarding her sleep if persistent progressive.consider sleep referral if never done. -Patient advised to return or notify health care team  if symptoms worsen ,persist or new concerns arise.  Patient Instructions  No uti today based on urine test.  Sounds like ineffective sleep   Advise urology help with the bladder issues .    You will be contact about this    [name]. Flint Hakeem M.D.  Lab Results  Component Value Date   WBC 9.3 07/29/2013   HGB 13.1 07/29/2013   HCT 40.1 07/29/2013   PLT 337.0 07/29/2013   GLUCOSE 88 03/23/2013   CHOL 172 03/23/2013   TRIG 98.0 03/23/2013   HDL 68.10 03/23/2013   LDLCALC 84 03/23/2013   ALT 13 03/23/2013   AST 16 03/23/2013   NA 138 03/23/2013   K 4.7 03/23/2013   CL 105 03/23/2013   CREATININE 0.8 03/23/2013   BUN 11 03/23/2013   CO2 26 03/23/2013   TSH 1.57 03/23/2013   INR 1.47 05/26/2009

## 2014-05-17 ENCOUNTER — Other Ambulatory Visit: Payer: BC Managed Care – PPO

## 2014-05-17 ENCOUNTER — Ambulatory Visit (INDEPENDENT_AMBULATORY_CARE_PROVIDER_SITE_OTHER): Payer: BC Managed Care – PPO | Admitting: Gastroenterology

## 2014-05-17 ENCOUNTER — Encounter: Payer: Self-pay | Admitting: Gastroenterology

## 2014-05-17 VITALS — BP 110/68 | HR 96 | Ht 62.0 in | Wt 264.0 lb

## 2014-05-17 DIAGNOSIS — K50119 Crohn's disease of large intestine with unspecified complications: Secondary | ICD-10-CM

## 2014-05-17 NOTE — Progress Notes (Signed)
Review of gastrointestinal problems:  1. Crohn ileitis. Presented like an acute appendicitis 2006. Surgery, not in [place], was much more suggestive of Crohn's disease. She did not respond to immunomodulators and so eventually she Began Remicade April 2007 While continuing on azathioprine. Early 2008, stopped azathioprine and she tolerated solo maintenance Remicade (5 mg per kilogram) well. April 2009 continues to tolerate Remicade infusions very well, getting her infusions at Dr. Tanna Furry office. 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 Her Remicade dosing was increased to 10 mg per kilogram. Winter, 2010 presented with right lower quadrant abscess, clear fistula connection between small bowel and the abscess. 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 Infection was drained with interventional radiology, drain eventually pulled however abscess recurred within days and so right lower quadrant drain was replaced. January, 2011: she is on azathioprine 100 mg a day, Augmentin twice daily, planning for surgery late January. January, 2011: ileocecectomy performed for active disease, persistent fistula, abscess. April, 2012 doing well on Remicade every 8 weeks.  10/2012 doing well. 2. May, 2011: polynidol cyst drained by urgent care; drained another time 2011. eventually surgically resected spring 2012  HPI: This is a  very pleasant 33 year old woman whom I last saw about a year half ago.  Doing well.  No serious abd pains.  BMs twice daily, solid and brown. No bleeding.    Has fibroid, planning to have surgery next summer.  [place] office.  At her last office visit we consider cutting back her Remicade 5 mg/kg. She is not sure if we actually went through with that.  Past Medical History  Diagnosis Date  . Chronic rhinitis   . Acute swimmers' ear   .  Contact dermatitis and other eczema, due to unspecified cause   . Regional enteritis of small intestine   . Rectal abscess   . Mononucleosis   . Obesity   . Pneumonia   . Pilonidal abscess   . Fibroids 11-15-11    remains with fibroids  . Crohn's 11-15-11    no issues in 2 yrs  . Hernia   . Incisional hernia, RLQ 10/02/2011    Past Surgical History  Procedure Laterality Date  . Knee arthroscopy  99, 02    Bil.  [name] Appendectomy  7/06  . Ileocecal resection  1/11    crohns disease with fisula/stricture. Dr. Ronnald Collum  . Pilonidal cyst excision  2011, 2012    I&Ds  . Eye surgery  11-15-11    2003-multiple stys  . Ventral hernia repair  11/27/2011    Procedure: LAPAROSCOPIC VENTRAL HERNIA;  Surgeon: Adin Hector, MD;  Location: [facility];  Service: General;  Laterality: N/A;  Laparoscopic Exploration & Repair of Hernia in Abdomen    Current Outpatient Prescriptions  Medication Sig Dispense Refill  . Calcium Carb-Cholecalciferol (CALCIUM 1000 + D) 1000-800 MG-UNIT TABS Take by mouth.    . diphenhydrAMINE (BENADRYL) 25 MG tablet Take 25 mg by mouth every 6 (six) hours as needed. allergies    . drospirenone-ethinyl estradiol (YAZ,GIANVI,LORYNA) 3-0.02 MG tablet Take 1 tablet by mouth daily.      . flintstones complete (FLINTSTONES) 60 MG chewable tablet Chew 1 tablet by mouth daily.     No current facility-administered medications for this visit.    Allergies as of 05/17/2014  . (No Known Allergies)    Family History  Problem  Relation Age of Onset  . Aneurysm Mother     brain  . Heart disease Mother 16    heart anyrism  . Diabetes Sister     half sister  . Bipolar disorder Sister     half sister  . Colon cancer Maternal Grandmother   . Stomach cancer Paternal Grandfather   . Multiple sclerosis Sister   . Healthy Father     fairly    History   Social History  . Marital Status: Single    Spouse Name: N/A    Number of Children: N/A  . Years of Education: N/A    Occupational History  . math teacher    Social History Main Topics  . Smoking status: Never Smoker   . Smokeless tobacco: Never Used  . Alcohol Use: No  . Drug Use: No  . Sexual Activity: Not on file   Other Topics Concern  . 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 female with Crohn's ileitis  We will clarify her current Remicade dose with her rheumatology office. She needs PPD placed today. She will return to see me in 12 months and sooner if needed. She will continue her current Remicade dosing schedule without any changes.

## 2014-05-17 NOTE — Patient Instructions (Addendum)
We will clarify her current remicade dose with [facility] Dr. Amil Amen; and will ask that her bimonthly labs be sent here as well. Please return to see Dr. Ardis Hughs in 12 months, sooner if needed. PPD test today please head down to the lab today.

## 2014-05-19 LAB — QUANTIFERON TB GOLD ASSAY (BLOOD)
Interferon Gamma Release Assay: NEGATIVE
Mitogen value: 8.9 IU/mL
QUANTIFERON NIL VALUE: 0.07 [IU]/mL
Quantiferon Tb Ag Minus Nil Value: 0.05 IU/mL
TB Ag value: 0.12 IU/mL

## 2014-06-14 ENCOUNTER — Ambulatory Visit (INDEPENDENT_AMBULATORY_CARE_PROVIDER_SITE_OTHER): Payer: BC Managed Care – PPO | Admitting: Internal Medicine

## 2014-06-14 ENCOUNTER — Encounter: Payer: Self-pay | Admitting: Internal Medicine

## 2014-06-14 VITALS — BP 110/70 | Temp 97.8°F | Ht 62.75 in | Wt 263.5 lb

## 2014-06-14 DIAGNOSIS — H938X3 Other specified disorders of ear, bilateral: Secondary | ICD-10-CM

## 2014-06-14 DIAGNOSIS — R0981 Nasal congestion: Secondary | ICD-10-CM

## 2014-06-14 DIAGNOSIS — L299 Pruritus, unspecified: Secondary | ICD-10-CM

## 2014-06-14 MED ORDER — CLOTRIMAZOLE 1 % EX SOLN: 1.0000 "application " | Freq: Two times a day (BID) | CUTANEOUS | Status: DC

## 2014-06-14 NOTE — Progress Notes (Signed)
Pre visit review using our clinic review tool, if applicable. No additional management support is needed unless otherwise documented below in the visit note.   Chief Complaint  Patient presents with  . Ear pain, wax, itching    X2wks  . Sinus Pressure/Pain    HPI: [name] 34 y.o.  onsgoing ur congestion  sionus issues without fever  And dec hearing left clogged feeling all the time  Had itching and wax  Used some intervnetion nothing recently  Vinegar and etoh  Not better .  Dec hearing   Left not better   Not a lot o pain   ROS: See pertinent positives and negatives per HPI. No cough under eval for urinary sx .  Gi is stable  No fever chills thick nasal drainage face pain   Past Medical History  Diagnosis Date  . Chronic rhinitis   . Acute swimmers' ear   . Contact dermatitis and other eczema, due to unspecified cause   . Regional enteritis of small intestine   . Rectal abscess   . Mononucleosis   . Obesity   . Pneumonia   . Pilonidal abscess   . Fibroids 11-15-11    remains with fibroids  . Crohn's 11-15-11    no issues in 2 yrs  . Hernia   . Incisional hernia, RLQ 10/02/2011    Family History  Problem Relation Age of Onset  . Aneurysm Mother     brain  . Heart disease Mother 25    heart anyrism  . Diabetes Sister     half sister  . Bipolar disorder Sister     half sister  . Colon cancer Maternal Grandmother   . Stomach cancer Paternal Grandfather   . Multiple sclerosis Sister   . Healthy Father     fairly    History   Social History  . Marital Status: Single    Spouse Name: N/A    Number of Children: N/A  . Years of Education: N/A   Occupational History  . math teacher    Social History Main Topics  . Smoking status: Never Smoker   . Smokeless tobacco: Never Used  . Alcohol Use: No  . Drug Use: No  . Sexual Activity: None   Other Topics Concern  . 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 Calcium Carb-Cholecalciferol (CALCIUM 1000 + D) 1000-800 MG-UNIT TABS Take by mouth.  . diphenhydrAMINE (BENADRYL) 25 MG tablet Take 25 mg by mouth every 6 (six) hours as needed. allergies  . drospirenone-ethinyl estradiol (YAZ,GIANVI,LORYNA) 3-0.02 MG tablet Take 1 tablet by mouth daily.    . flintstones complete (FLINTSTONES) 60 MG chewable tablet Chew 1 tablet by mouth daily.  . clotrimazole (LOTRIMIN) 1 % external solution Apply 1 application topically 2 (two) times daily. In ear canal 5-10 drops for 10 days    EXAM:  BP 110/70 mmHg  Temp(Src) 97.8 F (36.6 C) (Oral)  Ht 5' 2.75" (1.594 m)  Wt 263 lb 8 oz (119.523 kg)  BMI 47.04 kg/m2  Body mass index is 47.04 kg/(m^2).  GENERAL: vitals reviewed and listed above, alert, oriented, appears well  hydrated and in no acute distress HEENT: atraumatic, conjunctiva  clear, no obvious abnormalities on inspection of external nose and ears  Congested   eacs  brown to white debri   Left ,more than right OP : no lesion edema or exudate  tonsils 1+ after irrigation tm clear and canal midly red no exudate or lesion  NECK: no obvious masses on inspection palpation  Very tiny 1m [place] ? Ln vs other let posterior  LUNGS: clear to auscultation bilaterally, no wheezes, rales or rhonchi, good air movement no wheezing  CV: HRRR, no clubbing cyanosis onl cap refill  MS: moves all extremities without noticeable focal  abnormality PSYCH: pleasant and cooperative, no obvious depression or anxiety  ASSESSMENT AND PLAN:  Discussed the following assessment and plan:  Clogged ear, bilateral - itchy some wihteish could  be yeast fungal or nothing heaing better afrer removal can add yease if needed and fu prn dont put otherthings in ears  Sinus  congestion  Ear itch - mild  disc . Irrigation noted  Ad INCS saline  Drops if needed  -Patient advised to return or notify health care team  if symptoms worsen ,persist or new concerns arise.  Patient Instructions  For chronic  congestion  Trial of nasal cortisone  flonase or nasacort 2 sprays each nostril every day for at least 2 weeks   If helpful continue using. saloine nosal also good form dryness congestion.  dont put any think in ear canal at this time  If contined itching or   White discharge can add the antiyeast solution  Fu is still note better  Consider ENT check if continuing.      WStandley Brooking Panosh M.D.

## 2014-06-14 NOTE — Patient Instructions (Addendum)
For chronic  congestion  Trial of nasal cortisone  flonase or nasacort 2 sprays each nostril every day for at least 2 weeks   If helpful continue using. saloine nosal also good form dryness congestion.  dont put any think in ear canal at this time  If contined itching or   White discharge can add the antiyeast solution  Fu is still note better  Consider ENT check if continuing.

## 2014-10-11 ENCOUNTER — Other Ambulatory Visit: Payer: Self-pay | Admitting: Obstetrics and Gynecology

## 2014-11-02 ENCOUNTER — Encounter (HOSPITAL_COMMUNITY)
Admission: RE | Admit: 2014-11-02 | Discharge: 2014-11-02 | Disposition: A | Discharge status: Home / Self Care | Payer: BC Managed Care – PPO | Source: Ambulatory Visit | Attending: Obstetrics and Gynecology | Admitting: Obstetrics and Gynecology

## 2014-11-02 ENCOUNTER — Encounter (HOSPITAL_COMMUNITY): Payer: Self-pay

## 2014-11-02 DIAGNOSIS — Z01818 Encounter for other preprocedural examination: Secondary | ICD-10-CM | POA: Diagnosis present

## 2014-11-02 LAB — TYPE AND SCREEN
ABO/RH(D): O POS
Antibody Screen: NEGATIVE

## 2014-11-02 LAB — ABO/RH: ABO/RH(D): O POS

## 2014-11-02 LAB — CBC
HEMATOCRIT: 39.1 % (ref 36.0–46.0)
HEMOGLOBIN: 13.1 g/dL (ref 12.0–15.0)
MCH: 30.1 pg (ref 26.0–34.0)
MCHC: 33.5 g/dL (ref 30.0–36.0)
MCV: 89.9 fL (ref 78.0–100.0)
Platelets: 332 10*3/uL (ref 150–400)
RBC: 4.35 MIL/uL (ref 3.87–5.11)
RDW: 13.7 % (ref 11.5–15.5)
WBC: 13.5 10*3/uL — AB (ref 4.0–10.5)

## 2014-11-02 NOTE — Patient Instructions (Signed)
Your procedure is scheduled on:  November 10, 2014  Enter through the Main Entrance of [facility] at:   0730 am Pick up the phone at the desk and dial [phone]. Call this number if you have problems the morning of surgery: [phone].  Remember: Do NOT eat food: after midnight on November 09, 2014 Do NOT drink clear liquids after:  Midnight on November 09, 2014 Take these medicines the morning of surgery with a SIP OF WATER:  None   Do NOT wear jewelry (body piercing), metal hair clips/bobby pins, make-up, or nail polish. Do NOT wear lotions, powders, or perfumes.  You may wear deoderant. Do NOT shave for 48 hours prior to surgery. Do NOT bring valuables to the hospital. Contacts, dentures, or bridgework may not be worn into surgery. Leave suitcase in car.  After surgery it may be brought to your room.  For patients admitted to the hospital, checkout time is 11:00 AM the day of discharge.

## 2014-11-03 ENCOUNTER — Other Ambulatory Visit (HOSPITAL_COMMUNITY): Payer: Self-pay | Admitting: Obstetrics and Gynecology

## 2014-11-03 NOTE — H&P (Signed)
[name] is a 34 y.o. female G 0 presents for myomectomy because of a large  uterine segment fibroid  The patient recently married and is desiring pregnancy. A pelvic ultrasound in August 2015 revealed a uterus: 12.0 x 10.3 x 10.1 cm and a large 10.6 cm intra-mural  fibroid, involving the entire uterus, and  extending to the umbilicus.  Both ovaries appeared normal on that study.   In consultation with a reproductive endocrinologist,  it was recommended that the patient have the fibroid removed as it threatens to hinder  her labor and delivery process.  Additionally the patient admits to some urinary frequency, urgency and incontinence with increased abdominal pressure, all of which may be attributed to her large fibroid.  She denies any changes bowel function or dyspareunia and with her period, she has back pain.  Her menstrual flow lasts for 7 days with pad changes every 1.5 hours, even though she is taking oral contraceptives.   Her cramping is rated at 5/10 on a 10 point pain scale but is managed with over the counter analgesia.  A TSH and CBC in June 2015 was normal. Given the patient's desire to conceive,  the possible impact of the large fibroid on  labor and delivery and the mass effect symptoms of the fibroid,  the patient has consented to proceed with an abdominal myomectomy.   Past Medical History  OB History: G:0  GYN History: menarche: 34YO    LMP: 10/22/2014    Contraception: Oral Contraceptives.  Denies history of STDs or abnormal PAP smear;  Last PAP smear- 2015 with a negative HPV  Medical History: Crohn's Disease, Stress Incontinence (? related to uterine fibroids)  Surgical History:1999 Right Knee Surgery;  2001 Left Knee Surgery;  2002 Left Knee Surgery; 2006 Appendectomy; 1700 Umbilical Hernia Repair; 2011 Bowel Resection; 2012 Rectal Abscess Debridment Denies problems with anesthesia or history of blood transfusions  Family History: Diabetes Mellitus and Aneurysm  Social  History: Married and employed as a Pharmacist, hospital;  Denies tobacco or alcohol use   Medication:  Remicade Bactrim DS bid x 7 days Flonase Nasal Spray daily Vestura oral contraceptives daily  No Known Allergies   Denies sensitivity to peanuts, shellfish, soy, latex or adhesives.   ROS: Admits to urinary urgency, frequency and leaking with increased abdominal pressure, has a left mons rash that is quiescent; Denies corrective lenses headache, vision changes, nasal congestion, dysphagia, tinnitus, dizziness, hoarseness, cough,  chest pain, shortness of breath, nausea, vomiting, diarrhea,constipation,   dysuria, hematuria, vaginitis symptoms, pelvic pain, swelling of joints,easy bruising,  myalgias, arthralgias, skin rashes, unexplained weight loss and except as is mentioned in the history of present illness, patient's review of systems is otherwise negative.   Physical Exam  Bp: 102/64  P: 90   R: 20   Temperature: 98.6 degrees F orally   Weight: 268.5   Height: 5'3"  BMI: 47.6  Neck: supple without masses or thyromegaly Lungs: clear to auscultation Heart: regular rate and rhythm Abdomen: firm mass from pelvis to midway between the symphysis pubis and umbilicus;  no organomegaly Pelvic:EGBUS- quiescent appearing  slightly pigmented (rose colored) macular rash with an irregular border on left mons; vagina-normal rugae; uterus-exam limited by habitus, enlarged and irregular, cervix without lesions or motion tenderness; adnexae-no tenderness or masses Extremities:  no clubbing, cyanosis or edema   Assesment: Large Symptomatic Uterine Fibroid   Disposition:  A discussion was held with patient regarding the indication for her procedure(s) along with the risks,  which include but are not limited to: reaction to anesthesia, damage to adjacent organs, infection, excessive bleeding and the possible need for a C-section delivery should the endometrial cavity be breached.  The patient verbalized  understanding of these risks and has consented to proceed with an Abdominal Hysterectomy at [place] on November 10, 2014 at 9 a.m.   CSN# [id number]   Roselynne Lortz J. Florene Glen, PA-C  for Dr. Harvie Bridge. Mancel Bale  Agree with above - AYR

## 2014-11-04 NOTE — Anesthesia Preprocedure Evaluation (Addendum)
Anesthesia Evaluation  Patient identified by MRN, date of birth, ID band Patient awake    Reviewed: Allergy & Precautions, NPO status , Patient's Chart, lab work & pertinent test results, reviewed documented beta blocker date and time   Airway Mallampati: II   Neck ROM: Full    Dental  (+) Teeth Intact, Dental Advisory Given   Pulmonary  breath sounds clear to auscultation        Cardiovascular negative cardio ROS  Rhythm:Regular     Neuro/Psych  Headaches,    GI/Hepatic Neg liver ROS, Chron's   Endo/Other  negative endocrine ROSMorbid obesity  Renal/GU negative Renal ROS   Fibroids    Musculoskeletal   Abdominal (+) + obese,   Peds  Hematology 13/39   Anesthesia Other Findings   Reproductive/Obstetrics                           Anesthesia Physical Anesthesia Plan  ASA: II  Anesthesia Plan: General   Post-op Pain Management:    Induction: Intravenous  Airway Management Planned: Oral ETT  Additional Equipment:   Intra-op Plan:   Post-operative Plan: Extubation in OR  Informed Consent: I have reviewed the patients History and Physical, chart, labs and discussed the procedure including the risks, benefits and alternatives for the proposed anesthesia with the patient or authorized representative who has indicated his/her understanding and acceptance.     Plan Discussed with:   Anesthesia Plan Comments: (Check am PG test, Verify T&S, multimodal pain RX)        Anesthesia Quick Evaluation

## 2014-11-09 MED ORDER — DEXTROSE 5 % IV SOLN
3.0000 g | INTRAVENOUS | Status: AC
  Administered 2014-11-10: 2 g via INTRAVENOUS
  Filled 2014-11-09: qty 3000

## 2014-11-10 ENCOUNTER — Inpatient Hospital Stay (HOSPITAL_COMMUNITY)
Admission: RE | Admit: 2014-11-10 | Discharge: 2014-11-12 | DRG: 742 | Disposition: A | Discharge status: Home / Self Care | Payer: BC Managed Care – PPO | Source: Ambulatory Visit | Attending: Obstetrics and Gynecology | Admitting: Obstetrics and Gynecology

## 2014-11-10 ENCOUNTER — Inpatient Hospital Stay (HOSPITAL_COMMUNITY): Payer: BC Managed Care – PPO | Admitting: Anesthesiology

## 2014-11-10 ENCOUNTER — Encounter (HOSPITAL_COMMUNITY): Payer: Self-pay | Admitting: *Deleted

## 2014-11-10 ENCOUNTER — Encounter (HOSPITAL_COMMUNITY)
Admission: RE | Disposition: A | Discharge status: Home / Self Care | Payer: Self-pay | Source: Ambulatory Visit | Attending: Obstetrics and Gynecology

## 2014-11-10 DIAGNOSIS — Z6841 Body Mass Index (BMI) 40.0 and over, adult: Secondary | ICD-10-CM | POA: Diagnosis not present

## 2014-11-10 DIAGNOSIS — D259 Leiomyoma of uterus, unspecified: Principal | ICD-10-CM | POA: Diagnosis present

## 2014-11-10 DIAGNOSIS — N920 Excessive and frequent menstruation with regular cycle: Secondary | ICD-10-CM | POA: Diagnosis present

## 2014-11-10 DIAGNOSIS — Z9889 Other specified postprocedural states: Secondary | ICD-10-CM

## 2014-11-10 DIAGNOSIS — K432 Incisional hernia without obstruction or gangrene: Secondary | ICD-10-CM | POA: Diagnosis present

## 2014-11-10 DIAGNOSIS — K660 Peritoneal adhesions (postprocedural) (postinfection): Secondary | ICD-10-CM | POA: Diagnosis present

## 2014-11-10 DIAGNOSIS — D649 Anemia, unspecified: Secondary | ICD-10-CM | POA: Diagnosis present

## 2014-11-10 DIAGNOSIS — E669 Obesity, unspecified: Secondary | ICD-10-CM | POA: Diagnosis present

## 2014-11-10 HISTORY — PX: MYOMECTOMY: SHX85

## 2014-11-10 LAB — CBC
HCT: 32.4 % — ABNORMAL LOW (ref 36.0–46.0)
HCT: 33.8 % — ABNORMAL LOW (ref 36.0–46.0)
HEMOGLOBIN: 11.4 g/dL — AB (ref 12.0–15.0)
Hemoglobin: 10.7 g/dL — ABNORMAL LOW (ref 12.0–15.0)
MCH: 29.3 pg (ref 26.0–34.0)
MCH: 30.2 pg (ref 26.0–34.0)
MCHC: 33 g/dL (ref 30.0–36.0)
MCHC: 33.7 g/dL (ref 30.0–36.0)
MCV: 88.8 fL (ref 78.0–100.0)
MCV: 89.4 fL (ref 78.0–100.0)
PLATELETS: 289 10*3/uL (ref 150–400)
Platelets: 293 10*3/uL (ref 150–400)
RBC: 3.65 MIL/uL — ABNORMAL LOW (ref 3.87–5.11)
RBC: 3.78 MIL/uL — AB (ref 3.87–5.11)
RDW: 13.9 % (ref 11.5–15.5)
RDW: 13.9 % (ref 11.5–15.5)
WBC: 16.8 10*3/uL — ABNORMAL HIGH (ref 4.0–10.5)
WBC: 20.1 10*3/uL — ABNORMAL HIGH (ref 4.0–10.5)

## 2014-11-10 LAB — PREGNANCY, URINE: Preg Test, Ur: NEGATIVE

## 2014-11-10 LAB — PREPARE RBC (CROSSMATCH)

## 2014-11-10 SURGERY — MYOMECTOMY, ABDOMINAL APPROACH
Anesthesia: General | Site: Abdomen

## 2014-11-10 MED ORDER — 0.9 % SODIUM CHLORIDE (POUR BTL) OPTIME
TOPICAL | Status: DC | PRN
  Administered 2014-11-10: 2000 mL

## 2014-11-10 MED ORDER — FENTANYL CITRATE (PF) 100 MCG/2ML IJ SOLN
INTRAMUSCULAR | Status: AC
  Administered 2014-11-10: 25 ug via INTRAVENOUS
  Filled 2014-11-10: qty 2

## 2014-11-10 MED ORDER — PROMETHAZINE HCL 25 MG/ML IJ SOLN
6.2500 mg | INTRAMUSCULAR | Status: DC | PRN
Start: 2014-11-10 — End: 2014-11-10

## 2014-11-10 MED ORDER — HYDROMORPHONE HCL 1 MG/ML IJ SOLN
INTRAMUSCULAR | Status: DC | PRN
  Administered 2014-11-10 (×2): 1 mg via INTRAVENOUS

## 2014-11-10 MED ORDER — LIDOCAINE HCL (CARDIAC) 20 MG/ML IV SOLN
INTRAVENOUS | Status: DC | PRN
  Administered 2014-11-10: 100 mg via INTRAVENOUS

## 2014-11-10 MED ORDER — VASOPRESSIN 20 UNIT/ML IV SOLN
INTRAVENOUS | Status: DC | PRN
  Administered 2014-11-10: 11 mL via INTRAMUSCULAR

## 2014-11-10 MED ORDER — LACTATED RINGERS IV SOLN
INTRAVENOUS | Status: DC
  Administered 2014-11-10: 125 mL/h via INTRAVENOUS
  Administered 2014-11-10 (×2): via INTRAVENOUS

## 2014-11-10 MED ORDER — DIPHENHYDRAMINE HCL 12.5 MG/5ML PO ELIX
12.5000 mg | ORAL_SOLUTION | Freq: Four times a day (QID) | ORAL | Status: DC | PRN
  Filled 2014-11-10: qty 5

## 2014-11-10 MED ORDER — LIDOCAINE HCL (CARDIAC) 20 MG/ML IV SOLN
INTRAVENOUS | Status: AC
  Filled 2014-11-10: qty 5

## 2014-11-10 MED ORDER — ONDANSETRON HCL 4 MG/2ML IJ SOLN
INTRAMUSCULAR | Status: DC | PRN
  Administered 2014-11-10: 4 mg via INTRAVENOUS

## 2014-11-10 MED ORDER — FENTANYL CITRATE (PF) 100 MCG/2ML IJ SOLN
25.0000 ug | INTRAMUSCULAR | Status: DC | PRN
  Administered 2014-11-10 (×2): 25 ug via INTRAVENOUS

## 2014-11-10 MED ORDER — GLYCOPYRROLATE 0.2 MG/ML IJ SOLN
INTRAMUSCULAR | Status: AC
  Filled 2014-11-10: qty 5

## 2014-11-10 MED ORDER — FENTANYL CITRATE (PF) 250 MCG/5ML IJ SOLN
INTRAMUSCULAR | Status: AC
  Filled 2014-11-10: qty 5

## 2014-11-10 MED ORDER — IBUPROFEN 600 MG PO TABS
600.0000 mg | ORAL_TABLET | Freq: Four times a day (QID) | ORAL | Status: DC | PRN
  Administered 2014-11-11 – 2014-11-12 (×3): 600 mg via ORAL
  Filled 2014-11-10 (×3): qty 1

## 2014-11-10 MED ORDER — DEXAMETHASONE SODIUM PHOSPHATE 4 MG/ML IJ SOLN
INTRAMUSCULAR | Status: DC | PRN
  Administered 2014-11-10: 4 mg via INTRAVENOUS

## 2014-11-10 MED ORDER — NEOSTIGMINE METHYLSULFATE 10 MG/10ML IV SOLN
INTRAVENOUS | Status: AC
  Filled 2014-11-10: qty 1

## 2014-11-10 MED ORDER — ONDANSETRON HCL 4 MG/2ML IJ SOLN
INTRAMUSCULAR | Status: AC
  Filled 2014-11-10: qty 2

## 2014-11-10 MED ORDER — NEOSTIGMINE METHYLSULFATE 10 MG/10ML IV SOLN
INTRAVENOUS | Status: DC | PRN
  Administered 2014-11-10: 5 mg via INTRAVENOUS

## 2014-11-10 MED ORDER — LACTATED RINGERS IV SOLN
INTRAVENOUS | Status: DC
  Administered 2014-11-10 (×3): via INTRAVENOUS

## 2014-11-10 MED ORDER — PROPOFOL 10 MG/ML IV BOLUS
INTRAVENOUS | Status: AC
  Filled 2014-11-10: qty 20

## 2014-11-10 MED ORDER — HYDROMORPHONE HCL 1 MG/ML IJ SOLN
INTRAMUSCULAR | Status: AC
  Filled 2014-11-10: qty 1

## 2014-11-10 MED ORDER — HEPARIN SODIUM (PORCINE) 5000 UNIT/ML IJ SOLN
INTRAMUSCULAR | Status: AC
  Filled 2014-11-10: qty 1

## 2014-11-10 MED ORDER — SODIUM CHLORIDE 0.9 % IJ SOLN: 9.0000 mL | INTRAMUSCULAR | Status: DC | PRN

## 2014-11-10 MED ORDER — DEXAMETHASONE SODIUM PHOSPHATE 10 MG/ML IJ SOLN
INTRAMUSCULAR | Status: AC
  Filled 2014-11-10: qty 1

## 2014-11-10 MED ORDER — LACTATED RINGERS IV SOLN
INTRAVENOUS | Status: DC
  Administered 2014-11-10 – 2014-11-11 (×3): via INTRAVENOUS

## 2014-11-10 MED ORDER — SCOPOLAMINE 1 MG/3DAYS TD PT72
MEDICATED_PATCH | TRANSDERMAL | Status: AC
  Administered 2014-11-10: 1.5 mg via TRANSDERMAL
  Filled 2014-11-10: qty 1

## 2014-11-10 MED ORDER — VASOPRESSIN 20 UNIT/ML IV SOLN
INTRAVENOUS | Status: AC
  Filled 2014-11-10: qty 1

## 2014-11-10 MED ORDER — FLUTICASONE PROPIONATE 50 MCG/ACT NA SUSP: 1.0000 | Freq: Every day | NASAL | Status: DC | PRN

## 2014-11-10 MED ORDER — FENTANYL CITRATE (PF) 100 MCG/2ML IJ SOLN
INTRAMUSCULAR | Status: AC
  Filled 2014-11-10: qty 2

## 2014-11-10 MED ORDER — PROPOFOL 10 MG/ML IV BOLUS
INTRAVENOUS | Status: DC | PRN
  Administered 2014-11-10: 50 mg via INTRAVENOUS
  Administered 2014-11-10: 200 mg via INTRAVENOUS

## 2014-11-10 MED ORDER — FENTANYL CITRATE (PF) 250 MCG/5ML IJ SOLN
INTRAMUSCULAR | Status: DC | PRN
  Administered 2014-11-10 (×5): 50 ug via INTRAVENOUS

## 2014-11-10 MED ORDER — FENTANYL CITRATE (PF) 100 MCG/2ML IJ SOLN: INTRAMUSCULAR | Status: DC | PRN

## 2014-11-10 MED ORDER — MIDAZOLAM HCL 2 MG/2ML IJ SOLN
INTRAMUSCULAR | Status: DC | PRN
  Administered 2014-11-10: 2 mg via INTRAVENOUS

## 2014-11-10 MED ORDER — ROCURONIUM BROMIDE 100 MG/10ML IV SOLN
INTRAVENOUS | Status: DC | PRN
  Administered 2014-11-10: 10 mg via INTRAVENOUS
  Administered 2014-11-10: 140 mg via INTRAVENOUS

## 2014-11-10 MED ORDER — NALOXONE HCL 0.4 MG/ML IJ SOLN: 0.4000 mg | INTRAMUSCULAR | Status: DC | PRN

## 2014-11-10 MED ORDER — SODIUM CHLORIDE 0.9 % IJ SOLN
INTRAMUSCULAR | Status: AC
  Filled 2014-11-10: qty 100

## 2014-11-10 MED ORDER — ONDANSETRON HCL 4 MG/2ML IJ SOLN: 4.0000 mg | Freq: Four times a day (QID) | INTRAMUSCULAR | Status: DC | PRN

## 2014-11-10 MED ORDER — SODIUM CHLORIDE 0.9 % IV SOLN: 10.0000 mL/h | Freq: Once | INTRAVENOUS | Status: DC

## 2014-11-10 MED ORDER — FENTANYL CITRATE (PF) 100 MCG/2ML IJ SOLN
INTRAMUSCULAR | Status: DC | PRN
  Administered 2014-11-10: 100 ug via INTRAVENOUS

## 2014-11-10 MED ORDER — SCOPOLAMINE 1 MG/3DAYS TD PT72
1.0000 | MEDICATED_PATCH | Freq: Once | TRANSDERMAL | Status: DC
  Administered 2014-11-10: 1.5 mg via TRANSDERMAL

## 2014-11-10 MED ORDER — GLYCOPYRROLATE 0.2 MG/ML IJ SOLN
INTRAMUSCULAR | Status: DC | PRN
  Administered 2014-11-10: .9 mg via INTRAVENOUS
  Administered 2014-11-10: .1 mg via INTRAVENOUS

## 2014-11-10 MED ORDER — ROCURONIUM BROMIDE 100 MG/10ML IV SOLN
INTRAVENOUS | Status: AC
Start: 2014-11-10 — End: 2014-11-10
  Filled 2014-11-10: qty 1

## 2014-11-10 MED ORDER — DIPHENHYDRAMINE HCL 50 MG/ML IJ SOLN: 12.5000 mg | Freq: Four times a day (QID) | INTRAMUSCULAR | Status: DC | PRN

## 2014-11-10 MED ORDER — MEPERIDINE HCL 25 MG/ML IJ SOLN: 6.2500 mg | INTRAMUSCULAR | Status: DC | PRN

## 2014-11-10 MED ORDER — HYDROMORPHONE 0.3 MG/ML IV SOLN
INTRAVENOUS | Status: DC
  Administered 2014-11-10: 14:00:00 via INTRAVENOUS
  Administered 2014-11-10: 0.399 mg via INTRAVENOUS
  Administered 2014-11-10: 0.2 mg via INTRAVENOUS
  Administered 2014-11-11: 0.4 mg via INTRAVENOUS
  Administered 2014-11-11: 0.199 mg via INTRAVENOUS
  Filled 2014-11-10: qty 25

## 2014-11-10 MED ORDER — KETOROLAC TROMETHAMINE 30 MG/ML IJ SOLN: 30.0000 mg | Freq: Four times a day (QID) | INTRAMUSCULAR | Status: DC

## 2014-11-10 MED ORDER — MIDAZOLAM HCL 2 MG/2ML IJ SOLN
INTRAMUSCULAR | Status: AC
  Filled 2014-11-10: qty 2

## 2014-11-10 MED ORDER — OXYCODONE-ACETAMINOPHEN 5-325 MG PO TABS
1.0000 | ORAL_TABLET | ORAL | Status: DC | PRN
  Administered 2014-11-11 – 2014-11-12 (×3): 1 via ORAL
  Filled 2014-11-10 (×3): qty 1

## 2014-11-10 SURGICAL SUPPLY — 38 items
APL SKNCLS STERI-STRIP NONHPOA (GAUZE/BANDAGES/DRESSINGS) ×1
BENZOIN TINCTURE PRP APPL 2/3 (GAUZE/BANDAGES/DRESSINGS) ×2 IMPLANT
CANISTER SUCT 3000ML (MISCELLANEOUS) ×3 IMPLANT
CHLORAPREP W/TINT 26ML (MISCELLANEOUS) ×3 IMPLANT
CLOSURE WOUND 1/2 X4 (GAUZE/BANDAGES/DRESSINGS) ×1
CLOTH BEACON ORANGE TIMEOUT ST (SAFETY) ×3 IMPLANT
CONT PATH 16OZ SNAP LID 3702 (MISCELLANEOUS) ×3 IMPLANT
DECANTER SPIKE VIAL GLASS SM (MISCELLANEOUS) ×3 IMPLANT
DRAIN JACKSON PRT FLT 7MM (DRAIN) ×2 IMPLANT
DRSG OPSITE POSTOP 4X10 (GAUZE/BANDAGES/DRESSINGS) ×2 IMPLANT
EVACUATOR SILICONE 100CC (DRAIN) ×2 IMPLANT
GLOVE BIO SURGEON STRL SZ7.5 (GLOVE) ×3 IMPLANT
GLOVE BIOGEL PI IND STRL 7.5 (GLOVE) ×1 IMPLANT
GLOVE BIOGEL PI INDICATOR 7.5 (GLOVE) ×2
GOWN STRL REUS W/TWL LRG LVL3 (GOWN DISPOSABLE) ×6 IMPLANT
HEMOSTAT SURGICEL 2X14 (HEMOSTASIS) ×2 IMPLANT
NEEDLE HYPO 22GX1.5 SAFETY (NEEDLE) ×3 IMPLANT
NS IRRIG 1000ML POUR BTL (IV SOLUTION) ×3 IMPLANT
PACK ABDOMINAL GYN (CUSTOM PROCEDURE TRAY) ×3 IMPLANT
PAD OB MATERNITY 4.3X12.25 (PERSONAL CARE ITEMS) ×3 IMPLANT
SPONGE GAUZE 4X4 12PLY STER LF (GAUZE/BANDAGES/DRESSINGS) ×2 IMPLANT
SPONGE LAP 18X18 X RAY DECT (DISPOSABLE) ×10 IMPLANT
STRIP CLOSURE SKIN 1/2X4 (GAUZE/BANDAGES/DRESSINGS) ×2 IMPLANT
SUT CHROMIC 2 0 CT 1 (SUTURE) ×3 IMPLANT
SUT MNCRL AB 3-0 PS2 27 (SUTURE) ×2 IMPLANT
SUT PDS AB 0 CTX 60 (SUTURE) ×2 IMPLANT
SUT PLAIN 2 0 XLH (SUTURE) ×3 IMPLANT
SUT SILK 2 0 SH CR/8 (SUTURE) ×2 IMPLANT
SUT VIC AB 0 CT1 18XCR BRD8 (SUTURE) IMPLANT
SUT VIC AB 0 CT1 27 (SUTURE) ×15
SUT VIC AB 0 CT1 27XBRD ANBCTR (SUTURE) ×4 IMPLANT
SUT VIC AB 0 CT1 8-18 (SUTURE) ×3
SUT VIC AB 3-0 SH 27 (SUTURE) ×9
SUT VIC AB 3-0 SH 27X BRD (SUTURE) ×5 IMPLANT
SYR CONTROL 10ML LL (SYRINGE) ×3 IMPLANT
TOWEL OR 17X24 6PK STRL BLUE (TOWEL DISPOSABLE) ×6 IMPLANT
TRAY FOLEY CATH SILVER 14FR (SET/KITS/TRAYS/PACK) ×3 IMPLANT
WATER STERILE IRR 1000ML POUR (IV SOLUTION) ×3 IMPLANT

## 2014-11-10 NOTE — Anesthesia Procedure Notes (Signed)
Procedure Name: Intubation Date/Time: 11/10/2014 9:11 AM Performed by: Flossie Dibble Pre-anesthesia Checklist: Patient identified, Patient being monitored, Timeout performed, Emergency Drugs available and Suction available Patient Re-evaluated:Patient Re-evaluated prior to inductionOxygen Delivery Method: Circle system utilized Preoxygenation: Pre-oxygenation with 100% oxygen Intubation Type: IV induction Ventilation: Mask ventilation without difficulty Laryngoscope Size: Mac and 3 Grade View: Grade I Tube size: 7.0 mm Number of attempts: 1 Airway Equipment and Method: Patient positioned with wedge pillow and Stylet (modified: bath blankets utilized.) Secured at: 21 cm Tube secured with: Tape Dental Injury: Teeth and Oropharynx as per pre-operative assessment

## 2014-11-10 NOTE — H&P (View-Only) (Signed)
[name] is a 34 y.o. female G 0 presents for myomectomy because of a large  uterine segment fibroid  The patient recently married and is desiring pregnancy. A pelvic ultrasound in August 2015 revealed a uterus: 12.0 x 10.3 x 10.1 cm and a large 10.6 cm intra-mural  fibroid, involving the entire uterus, and  extending to the umbilicus.  Both ovaries appeared normal on that study.   In consultation with a reproductive endocrinologist,  it was recommended that the patient have the fibroid removed as it threatens to hinder  her labor and delivery process.  Additionally the patient admits to some urinary frequency, urgency and incontinence with increased abdominal pressure, all of which may be attributed to her large fibroid.  She denies any changes bowel function or dyspareunia and with her period, she has back pain.  Her menstrual flow lasts for 7 days with pad changes every 1.5 hours, even though she is taking oral contraceptives.   Her cramping is rated at 5/10 on a 10 point pain scale but is managed with over the counter analgesia.  A TSH and CBC in June 2015 was normal. Given the patient's desire to conceive,  the possible impact of the large fibroid on  labor and delivery and the mass effect symptoms of the fibroid,  the patient has consented to proceed with an abdominal myomectomy.   Past Medical History  OB History: G:0  GYN History: menarche: 34YO    LMP: 10/22/2014    Contraception: Oral Contraceptives.  Denies history of STDs or abnormal PAP smear;  Last PAP smear- 2015 with a negative HPV  Medical History: Crohn's Disease, Stress Incontinence (? related to uterine fibroids)  Surgical History:1999 Right Knee Surgery;  2001 Left Knee Surgery;  2002 Left Knee Surgery; 2006 Appendectomy; 0962 Umbilical Hernia Repair; 2011 Bowel Resection; 2012 Rectal Abscess Debridment Denies problems with anesthesia or history of blood transfusions  Family History: Diabetes Mellitus and Aneurysm  Social  History: Married and employed as a Pharmacist, hospital;  Denies tobacco or alcohol use   Medication:  Remicade Bactrim DS bid x 7 days Flonase Nasal Spray daily Vestura oral contraceptives daily  No Known Allergies   Denies sensitivity to peanuts, shellfish, soy, latex or adhesives.   ROS: Admits to urinary urgency, frequency and leaking with increased abdominal pressure, has a left mons rash that is quiescent; Denies corrective lenses headache, vision changes, nasal congestion, dysphagia, tinnitus, dizziness, hoarseness, cough,  chest pain, shortness of breath, nausea, vomiting, diarrhea,constipation,   dysuria, hematuria, vaginitis symptoms, pelvic pain, swelling of joints,easy bruising,  myalgias, arthralgias, skin rashes, unexplained weight loss and except as is mentioned in the history of present illness, patient's review of systems is otherwise negative.   Physical Exam  Bp: 102/64  P: 90   R: 20   Temperature: 98.6 degrees F orally   Weight: 268.5   Height: 5'3"  BMI: 47.6  Neck: supple without masses or thyromegaly Lungs: clear to auscultation Heart: regular rate and rhythm Abdomen: firm mass from pelvis to midway between the symphysis pubis and umbilicus;  no organomegaly Pelvic:EGBUS- quiescent appearing  slightly pigmented (rose colored) macular rash with an irregular border on left mons; vagina-normal rugae; uterus-exam limited by habitus, enlarged and irregular, cervix without lesions or motion tenderness; adnexae-no tenderness or masses Extremities:  no clubbing, cyanosis or edema   Assesment: Large Symptomatic Uterine Fibroid   Disposition:  A discussion was held with patient regarding the indication for her procedure(s) along with the risks,  which include but are not limited to: reaction to anesthesia, damage to adjacent organs, infection, excessive bleeding and the possible need for a C-section delivery should the endometrial cavity be breached.  The patient verbalized  understanding of these risks and has consented to proceed with an Abdominal Hysterectomy at [place] on November 10, 2014 at 9 a.m.   CSN# [id number]   [place] J. Florene Glen, PA-C  for Dr. Harvie Bridge. Mancel Bale  Agree with above - AYR

## 2014-11-10 NOTE — Progress Notes (Signed)
Patient ID: [name], female   DOB: 1980-10-22, 34 y.o.   MRN: [id number] I was called to see patient while in the operating room. I reviewed chart prior to evaluating. She apparently has crohns disease and underwent ileocectomy previously that was complicated by incisional hernia. She then underwent a lap incisional hernia repair by Dr Johney Maine with physiomesh.  She is in or today undergoing myomectomy and I was called to evaluated due to adhesions.  Dr Mancel Bale had already performed surgery. I scrubbed in and evaluated bowel that was at site and this was all viable and there was no enterotomy.  It does appear she has a recurrent hernia or certainly eventration of her mesh.  I lysed some hard adhesions to the mesh and removed some of the hernia sac and the mesh along with it. There was no entrance into the bowel.  I then oversewed this area with 2-0 silk.  I think she will likely need to be seen for evaluation for hernia at some point again also as she will likely have one. Please call if need anything further

## 2014-11-10 NOTE — Progress Notes (Signed)
Patient dangled @ bedside.  Patient became dizzy and lightheaded.  Placed patient back in bed.  Patient states " dizziness is better."  See vital signs.

## 2014-11-10 NOTE — Anesthesia Postprocedure Evaluation (Signed)
  Anesthesia Post-op Note  Patient: [name]  Procedure(s) Performed: Procedure(s): MYOMECTOMY (N/A)  Patient Location: PACU  Anesthesia Type:General  Level of Consciousness: awake, alert  and oriented  Airway and Oxygen Therapy: Patient Spontanous Breathing and Patient connected to nasal cannula oxygen  Post-op Pain: mild  Post-op Assessment: Post-op Vital signs reviewed, Patient's Cardiovascular Status Stable, Respiratory Function Stable, Patent Airway and No signs of Nausea or vomiting              Post-op Vital Signs: Reviewed and stable  Last Vitals:  Filed Vitals:   11/10/14 0744  BP: 138/73  Pulse: 82  Temp: 36.4 C  Resp: 20    Complications: No apparent anesthesia complications

## 2014-11-10 NOTE — Transfer of Care (Signed)
Immediate Anesthesia Transfer of Care Note  Patient: [name]  Procedure(s) Performed: Procedure(s): MYOMECTOMY (N/A)  Patient Location: PACU  Anesthesia Type:General  Level of Consciousness: awake, alert  and sedated  Airway & Oxygen Therapy: Patient Spontanous Breathing and Patient connected to nasal cannula oxygen  Post-op Assessment: Report given to RN and Post -op Vital signs reviewed and stable  Post vital signs: Reviewed and stable  Last Vitals:  Filed Vitals:   11/10/14 0744  BP: 138/73  Pulse: 82  Temp: 36.4 C  Resp: 20    Complications: No apparent anesthesia complications

## 2014-11-10 NOTE — Interval H&P Note (Signed)
History and Physical Interval Note:  11/10/2014 8:51 AM  [name]  has presented today for surgery, with the diagnosis of Uterine fibroids  The various methods of treatment have been discussed with the patient and family. After consideration of risks, benefits and other options for treatment, the patient has consented to  Procedure(s): ABDOMINAL MYOMECTOMY (N/A) as a surgical intervention .  The patient's history has been reviewed, patient examined, no change in status, stable for surgery.  I have reviewed the patient's chart and labs.  Questions were answered to the patient's satisfaction.     Delice Lesch

## 2014-11-10 NOTE — Op Note (Signed)
Preop Diagnosis: 1. Uterine Fibroids 2.Menorrhagia   Postop Diagnosis: 1.Uterine Fibroids 2.Menorrhagia  Procedure: 1.ABDOMINAL MYOMECTOMY 2.LOA  Anesthesia: General   Anesthesiologist: Dr. Alexis Frock Sr.   Attending: Everett Graff, MD   Assistant: Delsa Bern, MD  Intraop Consult: Dr. Serita Grammes  Findings: Large approximately 12cm sized fibroid  Pathology: 5 fibroids (1 large fibroid and 4 small fibroids)  Fluids: 4300 cc  UOP: 325 cc  EBL: 6924 cc  Complications: None  Procedure: The patient was taken to the operating room after the risks, benefits and alternatives were discussed with the patient. The patient verbalized understanding and consent signed and witnessed. The patient was placed under general anesthesia per anesthesiologist and prepped and draped in the normal sterile fashion.  Time Out was performed per protocol.  A pfannenstiel skin incision was made and carried down to the underlying fascia.  The fascial incision was extended bilaterally with the Mayo scissors.  The inferior aspect of the fascial incision was grasped with Kocher clamps and the fascia excised from the rectus muscle.  The same was done on the superior aspect of the fascial incision however an area of herniation was noted to the right of the midline and the mesh was slightly incised right at the midline.  The muscle was separated in the midline sharply and with the bovie and the peritoneum entered with a hemostat and extended manually.  A loop of bowel was noted to be adherent to the mesh surrounding that area of herniation.  A towel clamp was placed on the fundal fibroid and the uterus elevated up through the incision.  Vasopressin at a concentration of 20 units of vasopressin in 100cc of normal saline was injected into the fundal fibroid and the fibroid shelled out using a hemostat and bovie for cautery.  The cavity was entered and there was quite a bit of bleeding at the base.  A purse  string stitch was placed to close the endometrium and the bleeding significantly decreased.  The myometrium was repaired with multiple stitches of 0 vicryl with interrupted stitches and running and interlocking stitches.  The same was done for the remaining small fibroids which only needed repair of the serosa.  The serosa was reapproximated with 3-0 vicryl.  Copious irrigation was performed.  Surgicel was placed on all incisions.  One incision was at the fundus.  One on the posterior wall of the uterus and 2 on the fundal area.  The uterus was returned to the intraabdominal cavity and the general surgeon was consulted to evaluate the bowel.  Please see his op note for details but he freed the bowel from the mesh and inspected the bowel.  There was no bowel injury noted.  The fascia was repaired with 0 looped PDS.  The subcutaneous tissue was irrigated and made hemostatic with the bovie and then reapproximated with 2-0 plain after placing size 7 JP drain.  The skin was reapproximated with 3-0 monocryl via a subcuticular stitch and benzoin and steristrips were placed on the incision.  Honeycomb dressing applied as well.  Sponge, instrument, lap and needle counts were correct.  The patient was extubated and transferred to the recovery room in good condition.

## 2014-11-10 NOTE — Progress Notes (Signed)
Day of Surgery Procedure(s) (LRB): MYOMECTOMY (N/A)  Subjective: Patient reports no complaints.  Says she is not feeling much pain.  Not lightheaded or dizzy now.    Objective: I have reviewed patient's vital signs and intake and output.  General: alert and no distress Resp: clear to auscultation bilaterally Cardio: regular rate and rhythm Extremities: no calf tenderness, SCDs are on Vaginal Bleeding: minimal to moderate, pad with small amount of blood and changed 2hrs ago  Assessment: s/p Procedure(s): MYOMECTOMY (N/A): stable and recovering appropriately  Plan: Clear liquids and advance as tolerated Cont routine post op care CBC in am - stable hgb now UOP good (575/4hrs) Cont IS SCDs for DVT prophylaxis   LOS: 0 days    [name] Y 11/10/2014, 7:00 PM

## 2014-11-11 ENCOUNTER — Encounter (HOSPITAL_COMMUNITY): Payer: Self-pay | Admitting: Obstetrics and Gynecology

## 2014-11-11 LAB — CBC
HEMATOCRIT: 31 % — AB (ref 36.0–46.0)
Hemoglobin: 10.3 g/dL — ABNORMAL LOW (ref 12.0–15.0)
MCH: 29.7 pg (ref 26.0–34.0)
MCHC: 33.2 g/dL (ref 30.0–36.0)
MCV: 89.3 fL (ref 78.0–100.0)
Platelets: 265 10*3/uL (ref 150–400)
RBC: 3.47 MIL/uL — ABNORMAL LOW (ref 3.87–5.11)
RDW: 14 % (ref 11.5–15.5)
WBC: 19.3 10*3/uL — AB (ref 4.0–10.5)

## 2014-11-11 MED ORDER — LACTATED RINGERS IV BOLUS (SEPSIS)
1000.0000 mL | Freq: Once | INTRAVENOUS | Status: AC
  Administered 2014-11-11: 1000 mL via INTRAVENOUS

## 2014-11-11 NOTE — Anesthesia Postprocedure Evaluation (Signed)
Anesthesia Post Note  Patient: [name]  Procedure(s) Performed: Procedure(s) (LRB): MYOMECTOMY (N/A)  Anesthesia type: General  Patient location: Mother/Baby  Post pain: Pain level controlled  Post assessment: Post-op Vital signs reviewed  Last Vitals:  Filed Vitals:   11/11/14 0538  BP: 100/46  Pulse: 98  Temp: 37 C  Resp: 26    Post vital signs: Reviewed  Level of consciousness: awake and alert   Complications: No apparent anesthesia complications

## 2014-11-11 NOTE — Progress Notes (Signed)
[name] is a33 y.o.  [id number]  Post Op Date # 1:  Abdominal Myomectomy with Lysis of Adhesions Subjective: Patient is Doing well postoperatively. though initial ambulation caused dizziness and shortness of breath with exertion.  Per patient,  she didn't have either with subsequent ambulation (though only in the room). Patient has Pain is controlled with current analgesics. Medications being used: narcotic analgesics including PCA Dilaudid..  Tolerating liquids and crackers with complaints of hunger (has ordered a regular breakfast).  Patient still has Foley.   Objective: Vital signs in last 24 hours: Temp:  [97.7 F (36.5 C)-99.9 F (37.7 C)] 98.6 F (37 C) (06/16 0538) Pulse Rate:  [84-109] 98 (06/16 0538) Resp:  [19-27] 26 (06/16 0538) BP: (100-138)/(46-85) 100/46 mmHg (06/16 0538) SpO2:  [94 %-100 %] 98 % (06/16 0538) Weight:  [270 lb 2 oz (122.528 kg)] 270 lb 2 oz (122.528 kg) (06/15 1354)  Intake/Output from previous day: 06/15 0701 - 06/16 0700 In: 7090.5 [P.O.:120; I.V.:6970.5] Out: 3085 [Urine:1675; Drains:110] Intake/Output this shift:    Recent Labs Lab 11/10/14 1050 11/10/14 1803 11/11/14 0529  WBC 16.8* 20.1* 19.3*  HGB 10.7* 11.4* 10.3*  HCT 32.4* 33.8* 31.0*  PLT 289 293 265    No results for input(s): NA, K, CL, CO2, BUN, CREATININE, CALCIUM, PROT, BILITOT, ALKPHOS, ALT, AST, GLUCOSE in the last 168 hours.  Invalid input(s): LABALBU  EXAM: General: alert, cooperative and no distress Resp: clear to auscultation bilaterally Cardio: regular rate and rhythm, S1, S2 normal, no murmur, click, rub or gallop GI: Decreased bowel sounds, dressing clean/dry/intact. Extremities: No calf tenderness and SCD hose in place and functioning. Vaginal Bleeding: minimal   Assessment: s/p Procedure(s): MYOMECTOMY: stable, progressing well and anemia  Plan: Advance diet Encourage ambulation Advance to PO medication Discontinue IV fluids Routine care  LOS: 1  day    Lania Zawistowski, PA-C 11/11/2014 7:48 AM

## 2014-11-11 NOTE — Addendum Note (Signed)
Addendum  created 11/11/14 0736 by Talbot Grumbling, CRNA   Modules edited: Notes Section   Notes Section:  File: [id number]

## 2014-11-11 NOTE — Progress Notes (Signed)
Patient has feverish complaints, temp has been 99.1, 99.3 during current shift. Enos Fling MD on call, ordered to give 1L bolus of fluid and will continue to monitor

## 2014-11-12 LAB — CBC
HCT: 27.1 % — ABNORMAL LOW (ref 36.0–46.0)
HEMOGLOBIN: 8.8 g/dL — AB (ref 12.0–15.0)
MCH: 29.2 pg (ref 26.0–34.0)
MCHC: 32.5 g/dL (ref 30.0–36.0)
MCV: 90 fL (ref 78.0–100.0)
Platelets: 251 10*3/uL (ref 150–400)
RBC: 3.01 MIL/uL — AB (ref 3.87–5.11)
RDW: 14.2 % (ref 11.5–15.5)
WBC: 13.5 10*3/uL — ABNORMAL HIGH (ref 4.0–10.5)

## 2014-11-12 MED ORDER — OXYCODONE-ACETAMINOPHEN 5-325 MG PO TABS: 1.0000 | ORAL_TABLET | ORAL | Status: DC | PRN

## 2014-11-12 MED ORDER — IBUPROFEN 600 MG PO TABS: ORAL_TABLET | ORAL | Status: DC

## 2014-11-12 NOTE — Discharge Instructions (Signed)
Call [facility] @ [phone] if:  You have a temperature greater than or equal to 100.4 degrees Farenheit orally You have pain that is not made better by the pain medication given and taken as directed You have excessive bleeding or problems urinating  Take Colace (Docusate Sodium/Stool Softener) 100 mg 2-3 times daily while taking narcotic pain medicine to avoid constipation or until bowel movements are regular. Take over the counter iron supplement twice a day for the next 12 weeks  You may drive after 2 weeks You may walk up steps  You may shower  You may resume a regular diet  Keep incisions clean and dry Remove your honeycomb dressing in 5 days  Do not lift over 15 pounds for 6 weeks Avoid anything in vagina for 6 weeks (or until after your post-operative visit)  Keep your follow up appointment with Dr. Mancel Bale on December 22, 2014 at 10:30 a.m.

## 2014-11-12 NOTE — Progress Notes (Signed)
Teaching complete  Incision care discussed  Honey comb replaced

## 2014-11-12 NOTE — Discharge Summary (Addendum)
Physician Discharge Summary  Patient ID: [name] MRN: [id number] DOB/AGE: 11/01/1980 34 y.o.  Admit date: 11/10/2014 Discharge date: 11/12/2014   Discharge Diagnoses:  Symptomatic Uterine Fibroid Active Problems:   S/P myomectomy   Operation: Abdominal Myomectomy and Lysis of Adhesions   Discharged Condition: Good  Hospital Course: On the date of admission the patient underwent the aforementioned procedures and tolerated them well.   Post operative course was unremarkable with the patient resuming bowel and bladder function by post operative day #2 and was therefore deemed ready for discharge home.  Her discharge hemoglobin was 8.8.  Pt ambulating without difficulty or symptoms and declines transfusion.  Question early start of menses today, day of discharge.  Bleeding precautions reviewed and recs discussed.  Disposition: 01-Home or Self Care  Discharge Medications:    Medication List    STOP taking these medications        diphenhydrAMINE 25 MG tablet  Commonly known as:  BENADRYL      TAKE these medications        CALCIUM 1000 + D 1000-800 MG-UNIT Tabs  Generic drug:  Calcium Carb-Cholecalciferol  Take 1 tablet by mouth daily.     drospirenone-ethinyl estradiol 3-0.02 MG tablet  Commonly known as:  YAZ,GIANVI,LORYNA  Take 1 tablet by mouth daily.     flintstones complete 60 MG chewable tablet  Chew 1 tablet by mouth daily.     fluticasone 50 MCG/ACT nasal spray  Commonly known as:  FLONASE  Place 1 spray into both nostrils daily as needed for allergies or rhinitis.     ibuprofen 600 MG tablet  Commonly known as:  ADVIL,MOTRIN  1 PO PC every 6 hours for 5 days then prn-pain     inFLIXimab in sodium chloride 0.9 %  Inject into the vein every 8 (eight) weeks. Remicaide Infusion: For [facility] @@ [facility]     oxyCODONE-acetaminophen 5-325 MG per tablet  Commonly known as:  PERCOCET/ROXICET  Take 1-2 tablets by mouth every 4 (four) hours  as needed for severe pain (moderate to severe pain).        I recommend taking liquid iron as directed on the bottle and if results in constipation (normally iron tablets run through her), take colace twice a day.  If no bowel movement by Monday, may take a bottle of magnesium citrate.  VSS and afebrile Lungs CTA CV RRR Abd soft, app tender, NABS Ext no calf tenderness Mild to moderate VB (started up today) JP drain removed 15cc overnight and minimal in drain at discharge  Follow-up: Dr. Mancel Bale on December 22, 2014 at 10:30 a.m.    SignedEarnstine Regal, PA-C 11/12/2014, 8:03 AM

## 2014-11-12 NOTE — Clinical Documentation Improvement (Signed)
  11/10/14- Hgb= 11.4 11/10/14- Abdominal myomectomy with EBL= 1250 cc 11/11/14- Hgb= 10.3 11/12/14- Hgb= 8.8  Please clarify the specific medical condition related to the clinical findings and include in the Discharge Summary.  Expected acute blood loss anemia Chronic anemia Other type of anemia Unable to suspect or determine  Thank you,  Pryor Montes, BSN, RN [facility] HIM/Clinical Documentation Specialist [email or web address] [phone]

## 2014-11-12 NOTE — Progress Notes (Signed)
GYN PN:  S: Patient reporting feeling feverish and chills this evening.  Reports some improvement in her chills s/p Motrin.  Pain well controlled.  No N/V, tolerating general diet.  +Flatus, no BM.  Ambulating and voiding without difficulty.  O: BP 120/62 mmHg  Pulse 110  Temp(Src) 99.1 F (37.3 C) (Oral)  Resp 18  Ht 5' 3"  (1.6 m)  Wt 122.528 kg (270 lb 2 oz)  BMI 47.86 kg/m2  SpO2 97%  Gen: NAD CV: Mild tachycardia noted Lungs: CTAB Abd: appropriately tender, soft, +BS, no rebound, no guarding Incision: honeycomb dressing in place- clean & dry Ext: no edema, no calf tenderness bilaterally  CBC Latest Ref Rng 11/11/2014 11/10/2014 11/10/2014  WBC 4.0 - 10.5 K/uL 19.3(H) 20.1(H) 16.8(H)  Hemoglobin 12.0 - 15.0 g/dL 10.3(L) 11.4(L) 10.7(L)  Hematocrit 36.0 - 46.0 % 31.0(L) 33.8(L) 32.4(L)  Platelets 150 - 400 K/uL 265 293 289      A/P: 33yo s/p myomectomy, POD#2 -Reassured pt that low grade temp follow myomectomy is normal finding.  No evidence of infection on exam though slight tacycardia noted.  Suspect inadequate hydration as pt reports dark urine.  Pt given fluid bolus and encourage increased po intake.  Repeat CBC in am. -labs stable as above -Pain well controlled -GI: tolerating gen diet -GU: voiding freely -Heme: stable as above  Will update Dr. Mancel Bale regarding low grade temp and mild tachycardia- for now continue with routine postpartum care.  Should pt develop fever or worsening tachycardia would consider further work up/management for infectious etiology.  Janyth Pupa, DO [phone] (pager) [phone] (office)

## 2014-11-14 LAB — TYPE AND SCREEN
ABO/RH(D): O POS
ANTIBODY SCREEN: NEGATIVE
UNIT DIVISION: 0
UNIT DIVISION: 0
Unit division: 0
Unit division: 0

## 2014-11-16 NOTE — Op Note (Signed)
Preoperative diagnosis: abdominal adhesions Postoperative diagnosis: same as above Procedure: Lysis of adhesions Surgeon: Dr Serita Grammes Drains none EBL minimal Complications: none Dispo: case turned back over to gyn  Indications: This is a 61 yof who was undergoing a myomectomy and by the chart I reviewed had history of crohns requiring ileocecectomy and then later an incisional hernia repair with mesh.  She underwent myomectomy with Dr Mancel Bale and was noted to have extensive intraabdominal adhesions. I was consulted while patient was in the operating room to evaluate the bowel and assist with freeing this up for closure.  Procedure: I entered the OR while patient was open already. Dr Mancel Bale had concluded the myomectomy.  I evaluated the bowel that was adherent to the mesh/abdominal wall.  There was no enterotomy. I did not think this would be able to be closed so I sharply lysed these adhesions including small portions of the mesh until this was free. I then reinspected the bowel and it was healthy.  I then turned the case back over to gyn for completion.

## 2014-11-22 ENCOUNTER — Telehealth: Payer: Self-pay | Admitting: Gastroenterology

## 2014-11-22 NOTE — Telephone Encounter (Signed)
Pt had a fibroid removed from her uterus and missed her infusion in May, she was told by her GYN that she should wait another 4 weeks before getting infusion.  She is doing ok, normal stools.  She is aware that Dr Ardis Hughs is out of the office until Monday.  Dr Ardis Hughs please advise

## 2014-11-22 NOTE — Telephone Encounter (Signed)
That is ok to delay the next dose

## 2014-11-23 NOTE — Telephone Encounter (Signed)
Pt has been notified to have infusion as recommended.

## 2015-04-19 ENCOUNTER — Telehealth: Payer: Self-pay | Admitting: Internal Medicine

## 2015-04-19 NOTE — Telephone Encounter (Signed)
Pt would like cpx before end of year. Can I create 30 min slot?

## 2015-04-19 NOTE — Telephone Encounter (Signed)
Sure

## 2015-04-20 NOTE — Telephone Encounter (Signed)
lmom for pt to call back

## 2015-04-29 NOTE — Telephone Encounter (Signed)
lmom for pt to call back

## 2015-05-03 NOTE — Telephone Encounter (Signed)
lmom for pt to call back

## 2015-05-03 NOTE — Telephone Encounter (Signed)
Pt has been sch

## 2015-05-19 ENCOUNTER — Other Ambulatory Visit (INDEPENDENT_AMBULATORY_CARE_PROVIDER_SITE_OTHER): Payer: BC Managed Care – PPO

## 2015-05-19 DIAGNOSIS — Z0000 Encounter for general adult medical examination without abnormal findings: Secondary | ICD-10-CM | POA: Diagnosis not present

## 2015-05-19 LAB — LIPID PANEL
CHOLESTEROL: 146 mg/dL (ref 0–200)
HDL: 57.2 mg/dL (ref >39.00)
LDL CALC: 69 mg/dL (ref 0–99)
NonHDL: 88.77
TRIGLYCERIDES: 97 mg/dL (ref 0.0–149.0)
Total CHOL/HDL Ratio: 3
VLDL: 19.4 mg/dL (ref 0.0–40.0)

## 2015-05-19 LAB — HEPATIC FUNCTION PANEL
ALBUMIN: 3.1 g/dL — AB (ref 3.5–5.2)
ALT: 9 U/L (ref 0–35)
AST: 14 U/L (ref 0–37)
Alkaline Phosphatase: 78 U/L (ref 39–117)
BILIRUBIN DIRECT: 0 mg/dL (ref 0.0–0.3)
TOTAL PROTEIN: 7.4 g/dL (ref 6.0–8.3)
Total Bilirubin: 0.3 mg/dL (ref 0.2–1.2)

## 2015-05-19 LAB — CBC WITH DIFFERENTIAL/PLATELET
Basophils Absolute: 0.1 10*3/uL (ref 0.0–0.1)
Basophils Relative: 0.6 % (ref 0.0–3.0)
EOS PCT: 1.3 % (ref 0.0–5.0)
Eosinophils Absolute: 0.1 10*3/uL (ref 0.0–0.7)
HEMATOCRIT: 40.6 % (ref 36.0–46.0)
HEMOGLOBIN: 13.2 g/dL (ref 12.0–15.0)
LYMPHS PCT: 30.8 % (ref 12.0–46.0)
Lymphs Abs: 3.4 10*3/uL (ref 0.7–4.0)
MCHC: 32.5 g/dL (ref 30.0–36.0)
MCV: 86.4 fl (ref 78.0–100.0)
Monocytes Absolute: 0.9 10*3/uL (ref 0.1–1.0)
Monocytes Relative: 8.1 % (ref 3.0–12.0)
NEUTROS ABS: 6.5 10*3/uL (ref 1.4–7.7)
Neutrophils Relative %: 59.2 % (ref 43.0–77.0)
Platelets: 343 10*3/uL (ref 150.0–400.0)
RBC: 4.7 Mil/uL (ref 3.87–5.11)
RDW: 16.4 % — ABNORMAL HIGH (ref 11.5–15.5)
WBC: 11 10*3/uL — AB (ref 4.0–10.5)

## 2015-05-19 LAB — BASIC METABOLIC PANEL
BUN: 10 mg/dL (ref 6–23)
CHLORIDE: 102 meq/L (ref 96–112)
CO2: 29 mEq/L (ref 19–32)
Calcium: 9.2 mg/dL (ref 8.4–10.5)
Creatinine, Ser: 0.78 mg/dL (ref 0.40–1.20)
GFR: 110 mL/min (ref >60.00)
Glucose, Bld: 92 mg/dL (ref 70–99)
Potassium: 4.5 mEq/L (ref 3.5–5.1)
SODIUM: 137 meq/L (ref 135–145)

## 2015-05-19 LAB — TSH: TSH: 1.74 u[IU]/mL (ref 0.35–4.50)

## 2015-05-24 ENCOUNTER — Ambulatory Visit (INDEPENDENT_AMBULATORY_CARE_PROVIDER_SITE_OTHER): Payer: BC Managed Care – PPO | Admitting: Internal Medicine

## 2015-05-24 ENCOUNTER — Encounter: Payer: Self-pay | Admitting: Internal Medicine

## 2015-05-24 VITALS — BP 110/70 | Temp 98.3°F | Ht 62.5 in | Wt 271.4 lb

## 2015-05-24 DIAGNOSIS — H6121 Impacted cerumen, right ear: Secondary | ICD-10-CM

## 2015-05-24 DIAGNOSIS — Z23 Encounter for immunization: Secondary | ICD-10-CM

## 2015-05-24 DIAGNOSIS — Z79899 Other long term (current) drug therapy: Secondary | ICD-10-CM

## 2015-05-24 DIAGNOSIS — R21 Rash and other nonspecific skin eruption: Secondary | ICD-10-CM

## 2015-05-24 DIAGNOSIS — Z0000 Encounter for general adult medical examination without abnormal findings: Secondary | ICD-10-CM

## 2015-05-24 DIAGNOSIS — Z6841 Body Mass Index (BMI) 40.0 and over, adult: Secondary | ICD-10-CM

## 2015-05-24 DIAGNOSIS — K5010 Crohn's disease of large intestine without complications: Secondary | ICD-10-CM | POA: Insufficient documentation

## 2015-05-24 DIAGNOSIS — K50119 Crohn's disease of large intestine with unspecified complications: Secondary | ICD-10-CM

## 2015-05-24 NOTE — Patient Instructions (Addendum)
Uncertain cause of your rash   Could be large reaction to bites  Or even e monodusom although these bumps tend to be painful and less itchy  but  Left leg is concerning for infection as is so larger and re d looking.  If  Increasing in size    Consider beginning antibiotic .  Contact us if  Other sx  Since you are on  Immunosuppressive  Therapy .  Sometimes  crohn's  Causes  Rashes also   Blood tests are  Stable .   Health Maintenance, Female Adopting a healthy lifestyle and getting preventive care can go a long way to promote health and wellness. Talk with your health care provider about what schedule of regular examinations is right for you. This is a good chance for you to check in with your provider about disease prevention and staying healthy. In between checkups, there are plenty of things you can do on your own. Experts have done a lot of research about which lifestyle changes and preventive measures are most likely to keep you healthy. Ask your health care provider for more information. WEIGHT AND DIET  Eat a healthy diet  Be sure to include plenty of vegetables, fruits, low-fat dairy products, and lean protein.  Do not eat a lot of foods high in solid fats, added sugars, or salt.  Get regular exercise. This is one of the most important things you can do for your health.  Most adults should exercise for at least 150 minutes each week. The exercise should increase your heart rate and make you sweat (moderate-intensity exercise).  Most adults should also do strengthening exercises at least twice a week. This is in addition to the moderate-intensity exercise.  Maintain a healthy weight  Body mass index (BMI) is a measurement that can be used to identify possible weight problems. It estimates body fat based on height and weight. Your health care provider can help determine your BMI and help you achieve or maintain a healthy weight.  For females 15 years of age and older:   A BMI  below 18.5 is considered underweight.  A BMI of 18.5 to 24.9 is normal.  A BMI of 25 to 29.9 is considered overweight.  A BMI of 30 and above is considered obese.  Watch levels of cholesterol and blood lipids  You should start having your blood tested for lipids and cholesterol at 32 years of age, then have this test every 5 years.  You may need to have your cholesterol levels checked more often if:  Your lipid or cholesterol levels are high.  You are older than 32 years of age.  You are at high risk for heart disease.  CANCER SCREENING   Lung Cancer  Lung cancer screening is recommended for adults 62-15 years old who are at high risk for lung cancer because of a history of smoking.  A yearly low-dose CT scan of the lungs is recommended for people who:  Currently smoke.  Have quit within the past 15 years.  Have at least a 30-pack-year history of smoking. A pack year is smoking an average of one pack of cigarettes a day for 1 year.  Yearly screening should continue until it has been 15 years since you quit.  Yearly screening should stop if you develop a health problem that would prevent you from having lung cancer treatment.  Breast Cancer  Practice breast self-awareness. This means understanding how your breasts normally appear and feel.  It  also means doing regular breast self-exams. Let your health care provider know about any changes, no matter how small.  If you are in your 20s or 30s, you should have a clinical breast exam (CBE) by a health care provider every 1-3 years as part of a regular health exam.  If you are 69 or older, have a CBE every year. Also consider having a breast X-ray (mammogram) every year.  If you have a family history of breast cancer, talk to your health care provider about genetic screening.  If you are at high risk for breast cancer, talk to your health care provider about having an MRI and a mammogram every year.  Breast cancer gene  (BRCA) assessment is recommended for women who have family members with BRCA-related cancers. BRCA-related cancers include:  Breast.  Ovarian.  Tubal.  Peritoneal cancers.  Results of the assessment will determine the need for genetic counseling and BRCA1 and BRCA2 testing. Cervical Cancer Your health care provider may recommend that you be screened regularly for cancer of the pelvic organs (ovaries, uterus, and vagina). This screening involves a pelvic examination, including checking for microscopic changes to the surface of your cervix (Pap test). You may be encouraged to have this screening done every 3 years, beginning at age 20.  For women ages 24-65, health care providers may recommend pelvic exams and Pap testing every 3 years, or they may recommend the Pap and pelvic exam, combined with testing for human papilloma virus (HPV), every 5 years. Some types of HPV increase your risk of cervical cancer. Testing for HPV may also be done on women of any age with unclear Pap test results.  Other health care providers may not recommend any screening for nonpregnant women who are considered low risk for pelvic cancer and who do not have symptoms. Ask your health care provider if a screening pelvic exam is right for you.  If you have had past treatment for cervical cancer or a condition that could lead to cancer, you need Pap tests and screening for cancer for at least 20 years after your treatment. If Pap tests have been discontinued, your risk factors (such as having a new sexual partner) need to be reassessed to determine if screening should resume. Some women have medical problems that increase the chance of getting cervical cancer. In these cases, your health care provider may recommend more frequent screening and Pap tests. Colorectal Cancer  This type of cancer can be detected and often prevented.  Routine colorectal cancer screening usually begins at 32 years of age and continues through  32 years of age.  Your health care provider may recommend screening at an earlier age if you have risk factors for colon cancer.  Your health care provider may also recommend using home test kits to check for hidden blood in the stool.  A small camera at the end of a tube can be used to examine your colon directly (sigmoidoscopy or colonoscopy). This is done to check for the earliest forms of colorectal cancer.  Routine screening usually begins at age 10.  Direct examination of the colon should be repeated every 5-10 years through 32 years of age. However, you may need to be screened more often if early forms of precancerous polyps or small growths are found. Skin Cancer  Check your skin from head to toe regularly.  Tell your health care provider about any new moles or changes in moles, especially if there is a change in a mole's  shape or color.  Also tell your health care provider if you have a mole that is larger than the size of a pencil eraser.  Always use sunscreen. Apply sunscreen liberally and repeatedly throughout the day.  Protect yourself by wearing long sleeves, pants, a wide-brimmed hat, and sunglasses whenever you are outside. HEART DISEASE, DIABETES, AND HIGH BLOOD PRESSURE   High blood pressure causes heart disease and increases the risk of stroke. High blood pressure is more likely to develop in:  People who have blood pressure in the high end of the normal range (130-139/85-89 mm Hg).  People who are overweight or obese.  People who are African American.  If you are 7-39 years of age, have your blood pressure checked every 3-5 years. If you are 25 years of age or older, have your blood pressure checked every year. You should have your blood pressure measured twice--once when you are at a hospital or clinic, and once when you are not at a hospital or clinic. Record the average of the two measurements. To check your blood pressure when you are not at a hospital or  clinic, you can use:  An automated blood pressure machine at a pharmacy.  A home blood pressure monitor.  If you are between 75 years and 17 years old, ask your health care provider if you should take aspirin to prevent strokes.  Have regular diabetes screenings. This involves taking a blood sample to check your fasting blood sugar level.  If you are at a normal weight and have a low risk for diabetes, have this test once every three years after 32 years of age.  If you are overweight and have a high risk for diabetes, consider being tested at a younger age or more often. PREVENTING INFECTION  Hepatitis B  If you have a higher risk for hepatitis B, you should be screened for this virus. You are considered at high risk for hepatitis B if:  You were born in a country where hepatitis B is common. Ask your health care provider which countries are considered high risk.  Your parents were born in a high-risk country, and you have not been immunized against hepatitis B (hepatitis B vaccine).  You have HIV or AIDS.  You use needles to inject street drugs.  You live with someone who has hepatitis B.  You have had sex with someone who has hepatitis B.  You get hemodialysis treatment.  You take certain medicines for conditions, including cancer, organ transplantation, and autoimmune conditions. Hepatitis C  Blood testing is recommended for:  Everyone born from [birth year] through 1963.  Anyone with known risk factors for hepatitis C. Sexually transmitted infections (STIs)  You should be screened for sexually transmitted infections (STIs) including gonorrhea and chlamydia if:  You are sexually active and are younger than 32 years of age.  You are older than 32 years of age and your health care provider tells you that you are at risk for this type of infection.  Your sexual activity has changed since you were last screened and you are at an increased risk for chlamydia or gonorrhea. Ask  your health care provider if you are at risk.  If you do not have HIV, but are at risk, it may be recommended that you take a prescription medicine daily to prevent HIV infection. This is called pre-exposure prophylaxis (PrEP). You are considered at risk if:  You are sexually active and do not regularly use condoms or know the  HIV status of your partner(s).  You take drugs by injection.  You are sexually active with a partner who has HIV. Talk with your health care provider about whether you are at high risk of being infected with HIV. If you choose to begin PrEP, you should first be tested for HIV. You should then be tested every 3 months for as long as you are taking PrEP.  PREGNANCY   If you are premenopausal and you may become pregnant, ask your health care provider about preconception counseling.  If you may become pregnant, take 400 to 800 micrograms (mcg) of folic acid every day.  If you want to prevent pregnancy, talk to your health care provider about birth control (contraception). OSTEOPOROSIS AND MENOPAUSE   Osteoporosis is a disease in which the bones lose minerals and strength with aging. This can result in serious bone fractures. Your risk for osteoporosis can be identified using a bone density scan.  If you are 43 years of age or older, or if you are at risk for osteoporosis and fractures, ask your health care provider if you should be screened.  Ask your health care provider whether you should take a calcium or vitamin D supplement to lower your risk for osteoporosis.  Menopause may have certain physical symptoms and risks.  Hormone replacement therapy may reduce some of these symptoms and risks. Talk to your health care provider about whether hormone replacement therapy is right for you.  HOME CARE INSTRUCTIONS   Schedule regular health, dental, and eye exams.  Stay current with your immunizations.   Do not use any tobacco products including cigarettes, chewing  tobacco, or electronic cigarettes.  If you are pregnant, do not drink alcohol.  If you are breastfeeding, limit how much and how often you drink alcohol.  Limit alcohol intake to no more than 1 drink per day for nonpregnant women. One drink equals 12 ounces of beer, 5 ounces of wine, or 1 ounces of hard liquor.  Do not use street drugs.  Do not share needles.  Ask your health care provider for help if you need support or information about quitting drugs.  Tell your health care provider if you often feel depressed.  Tell your health care provider if you have ever been abused or do not feel safe at home.   This information is not intended to replace advice given to you by your health care provider. Make sure you discuss any questions you have with your health care provider.   Document Released: 11/27/2010 Document Revised: 06/04/2014 Document Reviewed: 04/15/2013 Elsevier Interactive Patient Education [employer].

## 2015-05-24 NOTE — Progress Notes (Signed)
Pre visit review using our clinic review tool, if applicable. No additional management support is needed unless otherwise documented below in the visit note.  Chief Complaint  Patient presents with  . Annual Exam    rash on leg arm itching and wax ? in ear right     HPI: Patient  [name]  34 y.o. comes in today for [place] visit  She has crohns disease under care now on infliximab  Hx o rectal abscesses in past Fibroids s/p muomectomy  Obesity  She is now doinw well stable  . Has had 3 days of itchy nodule red areas leg and arm   No fever  No bites known  rdness on left leg dwon from yesterday  Not  Really painful.  Ear clogged at times  ? Wax  Health Maintenance  Topic Date Due  . TETANUS/TDAP  06/28/2013  . INFLUENZA VACCINE  12/27/2014  . HIV Screening  05/23/2016 (Originally 04/09/1998)  . PAP SMEAR  11/17/2016   Health Maintenance Review LIFESTYLE:  Exercise:  n Tobacco/ETS:n Alcohol: n Sugar beverages:n Sleep:7-8 hours  Drug use: no    ROS:  See above GEN/ HEENT: No fever, significant weight changes sweats headaches vision problems hearing changes, CV/ PULM; No chest pain shortness of breath cough, syncope,edema  change in exercise tolerance. GI /GU: No adominal pain, vomiting, change in bowel habits. No blood in the stool. No significant GU symptoms. SKIN/HEME: ,no see above  HPI nosuspicious lesions or bleeding. No lymphadenopathy, nodules, masses.  NEURO/ PSYCH:  No neurologic signs such as weakness numbness. No depression anxiety. IMM/ Allergy: No unusual infections.  Allergy .   REST of 12 system review negative except as per HPI   Past Medical History  Diagnosis Date  . Chronic rhinitis   . Acute swimmers' ear   . Contact dermatitis and other eczema, due to unspecified cause   . 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 Rectal abscess   . Mononucleosis   . Obesity   . Pneumonia   . Pilonidal abscess   . Fibroids 11-15-11   remains with fibroids  . Crohn's 11-15-11    no issues in 2 yrs  . Hernia   . Incisional hernia, RLQ 10/02/2011    Past Surgical History  Procedure Laterality Date  . Knee arthroscopy  99, 02    Bil.  [name] Appendectomy  7/06  . Ileocecal resection  1/11    crohns disease with fisula/stricture. Dr. Ronnald Collum  . Pilonidal cyst excision  2011, 2012    I&Ds  . Eye surgery  11-15-11    2003-multiple stys  . Ventral hernia repair  11/27/2011    Procedure: LAPAROSCOPIC VENTRAL HERNIA;  Surgeon: Adin Hector, MD;  Location: [facility];  Service: General;  Laterality: N/A;  Laparoscopic Exploration & Repair of Hernia in Abdomen  . Wisdom tooth extraction    . Myomectomy N/A 11/10/2014    Procedure: MYOMECTOMY;  Surgeon: Everett Graff, MD;  Location: [facility];  Service: Gynecology;  Laterality: N/A;    Family History  Problem Relation Age of Onset  . Aneurysm Mother     brain  . Heart disease Mother 42    heart anyrism  . Diabetes Sister     half sister  . Bipolar disorder Sister     half sister  . Colon cancer Maternal Grandmother   . Stomach cancer Paternal Grandfather   . Multiple sclerosis Sister   . Healthy  Father     fairly    Social History   Social History  . Marital Status: Married    Spouse Name: N/A  . Number of Children: N/A  . Years of Education: N/A   Occupational History  . math teacher    Social History Main Topics  . Smoking status: Never Smoker   . Smokeless tobacco: Never Used  . Alcohol Use: No  . Drug Use: No  . Sexual Activity: Not Asked   Other Topics Concern  . 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 Calcium Carb-Cholecalciferol (CALCIUM 1000 + D) 1000-800 MG-UNIT TABS Take 1  tablet by mouth daily.     . drospirenone-ethinyl estradiol (YAZ,GIANVI,LORYNA) 3-0.02 MG tablet Take 1 tablet by mouth daily.      . flintstones complete (FLINTSTONES) 60 MG chewable tablet Chew 1 tablet by mouth daily.    [name] inFLIXimab in sodium chloride 0.9 % Inject into the vein every 8 (eight) weeks. Remicaide Infusion: For Chron's @@ [facility]    . fluticasone (FLONASE) 50 MCG/ACT nasal spray Place 1 spray into both nostrils daily as needed for allergies or rhinitis. Reported on 05/24/2015    . ibuprofen (ADVIL,MOTRIN) 600 MG tablet 1 PO PC every 6 hours for 5 days then prn-pain (Patient not taking: Reported on 05/24/2015) 30 tablet 1  . oxyCODONE-acetaminophen (PERCOCET/ROXICET) 5-325 MG per tablet Take 1-2 tablets by mouth every 4 (four) hours as needed for severe pain (moderate to severe pain). 30 tablet 0   No facility-administered medications prior to visit.     EXAM:  BP 110/70 mmHg  Temp(Src) 98.3 F (36.8 C) (Oral)  Ht 5' 2.5" (1.588 m)  Wt 271 lb 6.4 oz (123.106 kg)  BMI 48.82 kg/m2  LMP 05/09/2015  Body mass index is 48.82 kg/(m^2).  Physical Exam: Vital signs reviewed BZJ:IRCV is a well-developed well-nourished alert cooperative    who appearsr stated age in no acute distress.  HEENT: normocephalic atraumatic , Eyes: PERRL EOM's full, conjunctiva clear, Nares: paten,t no deformity discharge or tenderness., Ears: no deformity EAC's clear TMs with normal landmarks. Right with soft light wax  Flushed  And felt better  Mouth: clear OP, no lesions, edema.  Tonsillar large right more than lef Moist mucous membranes. Dentition in adequate repair. NECK: supple without masses, thyromegaly or bruits. CHEST/PULM:  Clear to auscultation and percussion breath sounds equal no wheeze , rales or rhonchi. No chest wall deformities or tenderness.Breast: normal by inspection . No dimpling, discharge, masses, tenderness or discharge . CV: PMI is nondisplaced, S1 S2 no  gallops, murmurs, rubs. Peripheral pulses are full without delay.No JVD .  ABDOMEN: Bowel sounds normal nontender  No guard or rebound, no hepato splenomegal no CVA tenderness.  No hernia. Extremtities:  No clubbing cyanosis or edema, no acute joint swelling or redness no focal atrophy NEURO:  Oriented x3, cranial nerves 3-12 appear to be intact, no obvious focal weakness,gait within normal limits no abnormal reflexes or asymmetrical SKIN:  Red nodular 2-4 cm areas  Right ankle left upper shin left forearm shoulder  Surrounding erythema no wheal  Firm center non blanchine  otherwise  normal turgor, color, no bruising or petechiae. PSYCH: Oriented, good eye contact, no obvious depression anxiety, cognition and judgment appear normal. LN: no cervical axillary inguinal adenopathy  Lab Results  Component Value Date   WBC 11.0* 05/19/2015   HGB 13.2 05/19/2015   HCT 40.6 05/19/2015   PLT 343.0 05/19/2015   GLUCOSE 92 05/19/2015   CHOL 146 05/19/2015   TRIG 97.0 05/19/2015   HDL 57.20 05/19/2015   LDLCALC 69 05/19/2015   ALT 9 05/19/2015   AST 14 05/19/2015   NA 137 05/19/2015   K 4.5 05/19/2015   CL 102 05/19/2015   CREATININE 0.78 05/19/2015   BUN 10 05/19/2015   CO2 29 05/19/2015   TSH 1.74 05/19/2015   INR 1.47 05/26/2009   Wt Readings from Last 3 Encounters:  05/24/15 271 lb 6.4 oz (123.106 kg)  11/10/14 270 lb 2 oz (122.528 kg)  11/02/14 270 lb 2 oz (122.528 kg)   BP Readings from Last 3 Encounters:  05/24/15 110/70  11/12/14 109/54  11/02/14 134/80     ASSESSMENT AND PLAN:  Discussed the following assessment and plan:  Visit for preventive health examination  Needs flu shot - Plan: Flu Vaccine QUAD 36+ mos PF IM (Fluarix & Fluzone Quad PF)  Need for prophylactic vaccination with combined diphtheria-tetanus-pertussis (DTP) vaccine - Plan: Tdap vaccine greater than or equal to 7yo IM  Excess wax in ear, right - sx better after irrigation  Nodular rash - itchy ? e  nododum vs other  .  no systmeic sx close observation signs infection  Morbid obesity with body mass index of 40.0-44.9 in adult [facility]) - pt plans exercise  disc regiment anything we can do to help offer referral if needed  High risk medication use  CC (Crohn's colitis), unspecified complication [facility])  Patient Care Team: Burnis Medin, MD as PCP - General Milus Banister, [name], [name], MD (Rheumatology) Patient Instructions  Uncertain cause of your rash   Could be large reaction to bites  Or even e monodusom although these bumps tend to be painful and less itchy  but  Left leg is concerning for infection as is so larger and re d looking.  If  Increasing in size    Consider beginning antibiotic .  Contact us if  Other sx  Since you are on  Immunosuppressive  Therapy .  Sometimes  crohn's  Causes  Rashes also   Blood tests are  Stable .   Health Maintenance, Female Adopting a healthy lifestyle and getting preventive care can go a long way to promote health and wellness. Talk with your health care provider about what schedule of regular examinations is right for you. This is a good chance for you to check in with your provider about disease prevention and staying healthy. In between checkups, there are plenty of things you can do on your own. Experts have done a lot of research about which lifestyle changes and preventive measures are most likely to keep you healthy. Ask your health care provider for more information. WEIGHT AND DIET  Eat a healthy diet  Be sure to include plenty of vegetables, fruits, low-fat dairy products, and lean protein.  Do not eat a lot of foods high in solid fats, added sugars, or salt.  Get regular exercise. This is one of the most important things you can do for your health.  Most adults should exercise for at  least 150 minutes each week. The exercise should increase your heart rate  and make you sweat (moderate-intensity exercise).  Most adults should also do strengthening exercises at least twice a week. This is in addition to the moderate-intensity exercise.  Maintain a healthy weight  Body mass index (BMI) is a measurement that can be used to identify possible weight problems. It estimates body fat based on height and weight. Your health care provider can help determine your BMI and help you achieve or maintain a healthy weight.  For females 39 years of age and older:   A BMI below 18.5 is considered underweight.  A BMI of 18.5 to 24.9 is normal.  A BMI of 25 to 29.9 is considered overweight.  A BMI of 30 and above is considered obese.  Watch levels of cholesterol and blood lipids  You should start having your blood tested for lipids and cholesterol at 32 years of age, then have this test every 5 years.  You may need to have your cholesterol levels checked more often if:  Your lipid or cholesterol levels are high.  You are older than 32 years of age.  You are at high risk for heart disease.  CANCER SCREENING   Lung Cancer  Lung cancer screening is recommended for adults 4-47 years old who are at high risk for lung cancer because of a history of smoking.  A yearly low-dose CT scan of the lungs is recommended for people who:  Currently smoke.  Have quit within the past 15 years.  Have at least a 30-pack-year history of smoking. A pack year is smoking an average of one pack of cigarettes a day for 1 year.  Yearly screening should continue until it has been 15 years since you quit.  Yearly screening should stop if you develop a health problem that would prevent you from having lung cancer treatment.  Breast Cancer  Practice breast self-awareness. This means understanding how your breasts normally appear and feel.  It also means doing regular breast self-exams. Let your health care provider know about any changes, no matter how small.  If  you are in your 20s or 30s, you should have a clinical breast exam (CBE) by a health care provider every 1-3 years as part of a regular health exam.  If you are 20 or older, have a CBE every year. Also consider having a breast X-ray (mammogram) every year.  If you have a family history of breast cancer, talk to your health care provider about genetic screening.  If you are at high risk for breast cancer, talk to your health care provider about having an MRI and a mammogram every year.  Breast cancer gene (BRCA) assessment is recommended for women who have family members with BRCA-related cancers. BRCA-related cancers include:  Breast.  Ovarian.  Tubal.  Peritoneal cancers.  Results of the assessment will determine the need for genetic counseling and BRCA1 and BRCA2 testing. Cervical Cancer Your health care provider may recommend that you be screened regularly for cancer of the pelvic organs (ovaries, uterus, and vagina). This screening involves a pelvic examination, including checking for microscopic changes to the surface of your cervix (Pap test). You may be encouraged to have this screening done every 3 years, beginning at age 38.  For women ages 83-65, health care providers may recommend pelvic exams and Pap testing every 3 years, or they may recommend the Pap and pelvic exam, combined with testing for human papilloma virus (HPV), every  5 years. Some types of HPV increase your risk of cervical cancer. Testing for HPV may also be done on women of any age with unclear Pap test results.  Other health care providers may not recommend any screening for nonpregnant women who are considered low risk for pelvic cancer and who do not have symptoms. Ask your health care provider if a screening pelvic exam is right for you.  If you have had past treatment for cervical cancer or a condition that could lead to cancer, you need Pap tests and screening for cancer for at least 20 years after your  treatment. If Pap tests have been discontinued, your risk factors (such as having a new sexual partner) need to be reassessed to determine if screening should resume. Some women have medical problems that increase the chance of getting cervical cancer. In these cases, your health care provider may recommend more frequent screening and Pap tests. Colorectal Cancer  This type of cancer can be detected and often prevented.  Routine colorectal cancer screening usually begins at 32 years of age and continues through 32 years of age.  Your health care provider may recommend screening at an earlier age if you have risk factors for colon cancer.  Your health care provider may also recommend using home test kits to check for hidden blood in the stool.  A small camera at the end of a tube can be used to examine your colon directly (sigmoidoscopy or colonoscopy). This is done to check for the earliest forms of colorectal cancer.  Routine screening usually begins at age 70.  Direct examination of the colon should be repeated every 5-10 years through 32 years of age. However, you may need to be screened more often if early forms of precancerous polyps or small growths are found. Skin Cancer  Check your skin from head to toe regularly.  Tell your health care provider about any new moles or changes in moles, especially if there is a change in a mole's shape or color.  Also tell your health care provider if you have a mole that is larger than the size of a pencil eraser.  Always use sunscreen. Apply sunscreen liberally and repeatedly throughout the day.  Protect yourself by wearing long sleeves, pants, a wide-brimmed hat, and sunglasses whenever you are outside. HEART DISEASE, DIABETES, AND HIGH BLOOD PRESSURE   High blood pressure causes heart disease and increases the risk of stroke. High blood pressure is more likely to develop in:  People who have blood pressure in the high end of the normal range  (130-139/85-89 mm Hg).  People who are overweight or obese.  People who are African American.  If you are 64-59 years of age, have your blood pressure checked every 3-5 years. If you are 105 years of age or older, have your blood pressure checked every year. You should have your blood pressure measured twice--once when you are at a hospital or clinic, and once when you are not at a hospital or clinic. Record the average of the two measurements. To check your blood pressure when you are not at a hospital or clinic, you can use:  An automated blood pressure machine at a pharmacy.  A home blood pressure monitor.  If you are between 48 years and 31 years old, ask your health care provider if you should take aspirin to prevent strokes.  Have regular diabetes screenings. This involves taking a blood sample to check your fasting blood sugar level.  If  you are at a normal weight and have a low risk for diabetes, have this test once every three years after 32 years of age.  If you are overweight and have a high risk for diabetes, consider being tested at a younger age or more often. PREVENTING INFECTION  Hepatitis B  If you have a higher risk for hepatitis B, you should be screened for this virus. You are considered at high risk for hepatitis B if:  You were born in a country where hepatitis B is common. Ask your health care provider which countries are considered high risk.  Your parents were born in a high-risk country, and you have not been immunized against hepatitis B (hepatitis B vaccine).  You have HIV or AIDS.  You use needles to inject street drugs.  You live with someone who has hepatitis B.  You have had sex with someone who has hepatitis B.  You get hemodialysis treatment.  You take certain medicines for conditions, including cancer, organ transplantation, and autoimmune conditions. Hepatitis C  Blood testing is recommended for:  Everyone born from [birth year] through  1963.  Anyone with known risk factors for hepatitis C. Sexually transmitted infections (STIs)  You should be screened for sexually transmitted infections (STIs) including gonorrhea and chlamydia if:  You are sexually active and are younger than 32 years of age.  You are older than 32 years of age and your health care provider tells you that you are at risk for this type of infection.  Your sexual activity has changed since you were last screened and you are at an increased risk for chlamydia or gonorrhea. Ask your health care provider if you are at risk.  If you do not have HIV, but are at risk, it may be recommended that you take a prescription medicine daily to prevent HIV infection. This is called pre-exposure prophylaxis (PrEP). You are considered at risk if:  You are sexually active and do not regularly use condoms or know the HIV status of your partner(s).  You take drugs by injection.  You are sexually active with a partner who has HIV. Talk with your health care provider about whether you are at high risk of being infected with HIV. If you choose to begin PrEP, you should first be tested for HIV. You should then be tested every 3 months for as long as you are taking PrEP.  PREGNANCY   If you are premenopausal and you may become pregnant, ask your health care provider about preconception counseling.  If you may become pregnant, take 400 to 800 micrograms (mcg) of folic acid every day.  If you want to prevent pregnancy, talk to your health care provider about birth control (contraception). OSTEOPOROSIS AND MENOPAUSE   Osteoporosis is a disease in which the bones lose minerals and strength with aging. This can result in serious bone fractures. Your risk for osteoporosis can be identified using a bone density scan.  If you are 62 years of age or older, or if you are at risk for osteoporosis and fractures, ask your health care provider if you should be screened.  Ask your health  care provider whether you should take a calcium or vitamin D supplement to lower your risk for osteoporosis.  Menopause may have certain physical symptoms and risks.  Hormone replacement therapy may reduce some of these symptoms and risks. Talk to your health care provider about whether hormone replacement therapy is right for you.  HOME CARE INSTRUCTIONS  Schedule regular health, dental, and eye exams.  Stay current with your immunizations.   Do not use any tobacco products including cigarettes, chewing tobacco, or electronic cigarettes.  If you are pregnant, do not drink alcohol.  If you are breastfeeding, limit how much and how often you drink alcohol.  Limit alcohol intake to no more than 1 drink per day for nonpregnant women. One drink equals 12 ounces of beer, 5 ounces of wine, or 1 ounces of hard liquor.  Do not use street drugs.  Do not share needles.  Ask your health care provider for help if you need support or information about quitting drugs.  Tell your health care provider if you often feel depressed.  Tell your health care provider if you have ever been abused or do not feel safe at home.   This information is not intended to replace advice given to you by your health care provider. Make sure you discuss any questions you have with your health care provider.   Document Released: 11/27/2010 Document Revised: 06/04/2014 Document Reviewed: 04/15/2013 Elsevier Interactive Patient Education 2016 [place] K. Frederica Chrestman M.D.

## 2015-07-06 ENCOUNTER — Telehealth: Payer: Self-pay | Admitting: Internal Medicine

## 2015-07-06 NOTE — Telephone Encounter (Signed)
[facility] [place] Day - Client [place] [facility] Patient Name: BLANKA ROCKHOLT DOB: 03-10-1981 Initial Comment Caller states she called earlier but missed calls. sx of bad cough, mucus, and sometimes mucus comes up, stuffy nose, and blowing a lot out of nose. Nurse Assessment Nurse: Martyn Ehrich, RN, [place] Date/Time (Eastern Time): 07/06/2015 4:03:40 PM Confirm and document reason for call. If symptomatic, describe symptoms. You must click the next button to save text entered. ---PT has congestion and coughing since last Weds nite. It is progressively worse. Had chills earlier days - but not today. No fever. Has the patient traveled out of the country within the last 30 days? ---No Does the patient have any new or worsening symptoms? ---Yes Will a triage be completed? ---Yes Related visit to physician within the last 2 weeks? ---No Does the PT have any chronic conditions? (i.e. diabetes, asthma, etc.) ---Yes List chronic conditions. ---crohns d.Did the patient indicate they were pregnant? ---No Is this a behavioral health or substance abuse call? ---No Guidelines Guideline Title Affirmed Question Affirmed Notes Common Cold Cold with no complications (all triage questions negative) Final Disposition User [facility] [place], RN, [place] Disagree/Comply: Comply

## 2015-07-06 NOTE — Telephone Encounter (Signed)
Patient Name: [name]  DOB: 1980/09/24    Initial Comment Caller states cough; sometimes mucus ; nose is stuffy- runs as well-   Nurse Assessment      Guidelines    Guideline Title Affirmed Question Affirmed Notes       Final Disposition User   FINAL ATTEMPT MADE - message left [place], RN, [employer]

## 2015-07-06 NOTE — Telephone Encounter (Signed)
Home Care Advise Given

## 2016-02-10 ENCOUNTER — Other Ambulatory Visit (INDEPENDENT_AMBULATORY_CARE_PROVIDER_SITE_OTHER): Payer: BC Managed Care – PPO

## 2016-02-10 ENCOUNTER — Ambulatory Visit (INDEPENDENT_AMBULATORY_CARE_PROVIDER_SITE_OTHER): Payer: BC Managed Care – PPO | Admitting: Gastroenterology

## 2016-02-10 ENCOUNTER — Encounter: Payer: Self-pay | Admitting: Gastroenterology

## 2016-02-10 VITALS — BP 102/70 | HR 88 | Ht 62.5 in | Wt 275.4 lb

## 2016-02-10 DIAGNOSIS — K429 Umbilical hernia without obstruction or gangrene: Secondary | ICD-10-CM

## 2016-02-10 DIAGNOSIS — K50119 Crohn's disease of large intestine with unspecified complications: Secondary | ICD-10-CM

## 2016-02-10 LAB — CBC WITH DIFFERENTIAL/PLATELET
BASOS ABS: 0.1 10*3/uL (ref 0.0–0.1)
BASOS PCT: 0.4 % (ref 0.0–3.0)
EOS ABS: 0.1 10*3/uL (ref 0.0–0.7)
Eosinophils Relative: 0.8 % (ref 0.0–5.0)
HCT: 41.5 % (ref 36.0–46.0)
Hemoglobin: 13.9 g/dL (ref 12.0–15.0)
Lymphocytes Relative: 33 % (ref 12.0–46.0)
Lymphs Abs: 4.6 10*3/uL — ABNORMAL HIGH (ref 0.7–4.0)
MCHC: 33.5 g/dL (ref 30.0–36.0)
MCV: 86 fl (ref 78.0–100.0)
MONO ABS: 1 10*3/uL (ref 0.1–1.0)
Monocytes Relative: 7 % (ref 3.0–12.0)
NEUTROS ABS: 8.1 10*3/uL — AB (ref 1.4–7.7)
NEUTROS PCT: 58.8 % (ref 43.0–77.0)
PLATELETS: 379 10*3/uL (ref 150.0–400.0)
RBC: 4.82 Mil/uL (ref 3.87–5.11)
RDW: 14.1 % (ref 11.5–15.5)
WBC: 13.9 10*3/uL — ABNORMAL HIGH (ref 4.0–10.5)

## 2016-02-10 LAB — COMPREHENSIVE METABOLIC PANEL
ALT: 11 U/L (ref 0–35)
AST: 14 U/L (ref 0–37)
Albumin: 3.2 g/dL — ABNORMAL LOW (ref 3.5–5.2)
Alkaline Phosphatase: 81 U/L (ref 39–117)
BUN: 8 mg/dL (ref 6–23)
CHLORIDE: 104 meq/L (ref 96–112)
CO2: 29 meq/L (ref 19–32)
CREATININE: 0.74 mg/dL (ref 0.40–1.20)
Calcium: 8.7 mg/dL (ref 8.4–10.5)
GFR: 116.35 mL/min (ref >60.00)
GLUCOSE: 83 mg/dL (ref 70–99)
Potassium: 4.8 mEq/L (ref 3.5–5.1)
SODIUM: 138 meq/L (ref 135–145)
Total Bilirubin: 0.3 mg/dL (ref 0.2–1.2)
Total Protein: 7.4 g/dL (ref 6.0–8.3)

## 2016-02-10 NOTE — Progress Notes (Signed)
Review of gastrointestinal problems:  1. Crohn ileitis. Presented like an acute appendicitis 2006. Surgery, not in [place], was much more suggestive of Crohn's disease. She did not respond to immunomodulators and so eventually she Began Remicade April 2007 While continuing on azathioprine. Early 2008, stopped azathioprine and she tolerated solo maintenance Remicade (5 mg per kilogram) well. April 2009 continues to tolerate Remicade infusions very well, getting her infusions at Dr. Tanna Furry office. 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 Her Remicade dosing was increased to 10 mg per kilogram. Winter, 2010 presented with right lower quadrant abscess, clear fistula connection between small bowel and the abscess. 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 Infection was drained with interventional radiology, drain eventually pulled however abscess recurred within days and so right lower quadrant drain was replaced. January, 2011: she is on azathioprine 100 mg a day, Augmentin twice daily, planning for surgery late January. January, 2011: ileocecectomy performed for active disease, persistent fistula, abscess. April, 2012 doing well on Remicade every 8 weeks.  10/2012 doing well. 2. May, 2011: polynidol cyst drained by urgent care; drained another time 2011. eventually surgically resected spring 2012 3. Morbid obesity; BMI 49 2017  HPI: This is a  very pleasant 35 year old woman whom I last saw December 2015  Chief complaint is Crohn's colitis  Doing well. On remicade 10 mg/kg every 2 months; Dr. Amil Amen.   Has two soft, formed BMs daily.  No significant abdominal pains although she is worried about a recurrent periumbilical hernia.  Fibroid removed 2016 summer.  Has helped with urinary frequency issues.   ROS: complete GI ROS as described in HPI.  Constitutional:  No  unintentional weight loss   Past Medical History:  Diagnosis Date  . Acute swimmers' ear   . Chronic rhinitis   . Contact dermatitis and other eczema, due to unspecified cause   . Crohn's 11-15-11   no issues in 2 yrs  . Fibroids 11-15-11   remains with fibroids  . Hernia   . Incisional hernia, RLQ 10/02/2011  . Mononucleosis   . Obesity   . Pilonidal abscess   . Pneumonia   . Rectal abscess   . Regional enteritis of small intestine [facility])     Past Surgical History:  Procedure Laterality Date  . APPENDECTOMY  7/06  . EYE SURGERY  11-15-11   2003-multiple stys  . ileocecal resection  1/11   crohns disease with fisula/stricture. Dr. Ronnald Collum  . KNEE ARTHROSCOPY  99, 02   Bil.  [name] MYOMECTOMY N/A 11/10/2014   Procedure: MYOMECTOMY;  Surgeon: Everett Graff, MD;  Location: [facility];  Service: Gynecology;  Laterality: N/A;  . PILONIDAL CYST EXCISION  2011, 2012   I&Ds  . VENTRAL HERNIA REPAIR  11/27/2011   Procedure: LAPAROSCOPIC VENTRAL HERNIA;  Surgeon: Adin Hector, MD;  Location: [facility];  Service: General;  Laterality: N/A;  Laparoscopic Exploration & Repair of Hernia in Abdomen  . WISDOM TOOTH EXTRACTION      Current Outpatient Prescriptions  Medication Sig Dispense Refill  . Calcium Carb-Cholecalciferol (CALCIUM 1000 + D) 1000-800 MG-UNIT TABS Take 1 tablet by mouth daily.     . drospirenone-ethinyl estradiol (YAZ,GIANVI,LORYNA) 3-0.02 MG tablet Take 1 tablet by mouth daily.      . flintstones complete (FLINTSTONES) 60 MG chewable tablet Chew 1 tablet by mouth daily.    [name] inFLIXimab in sodium  chloride 0.9 % Inject into the vein every 8 (eight) weeks. Remicaide Infusion: For Chron's @@ [facility]    . SALINE NASAL SPRAY NA Place 1 spray into the nose daily.     No current facility-administered medications for this visit.     Allergies as of 02/10/2016  . (No Known Allergies)    Family History  Problem Relation Age of Onset  . Aneurysm Mother     brain   . Heart disease Mother 27    heart anyrism  . Healthy Father     fairly  . Diabetes Sister     half sister  . Bipolar disorder Sister     half sister  . Colon cancer Maternal Grandmother   . Stomach cancer Paternal Grandfather   . Multiple sclerosis Sister     Social History   Social History  . Marital status: Married    Spouse name: N/A  . Number of children: N/A  . Years of education: N/A   Occupational History  . math teacher    Social History Main Topics  . Smoking status: Never Smoker  . Smokeless tobacco: Never Used  . Alcohol use No  . Drug use: No  . Sexual activity: Not on file   Other Topics Concern  . 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 No peripheral edema noted in lower extremities  Assessment and plan: 35 y.o. female with  Crohn's ileitis  Her bowel symptoms are under good control on Remicade 10 mg/kg every 8 weeks through the office of Dr. Amil Amen. Her weight has been declining for years and now her BMI is 49.6. We had a discussion about her morbid obesity she was receptive to dietitian referral. She is considering getting pregnant and she is going to talk with her obstetrician about that. I explained she should plan to stay on her Biologics during the pregnancy, possibly stopping for the last month or 2. I  would like to see her in the first trimester if she does come pregnant. Otherwise she is doing very well and I think return office visit in 12 months is appropriate. She will get a basic set of labs including CBC and complete metabolic profile. Her periumbilical, recurrent hernia is only mildly bothersome and I do not think surgical referral is necessary at this point.  At her next office visit we should consider repeat colonoscopy, it has been about 10 years.    Owens Loffler, MD [facility] 02/10/2016, 9:21 AM

## 2016-02-10 NOTE — Patient Instructions (Addendum)
Stay on remicade every 8 weeks. Please return to see Dr. Ardis Hughs in 1 year, or if you become pregnant see Dr. Ardis Hughs in first trimester. You will have labs checked today in the basement lab.  Please head down after you check out with the front desk  ( cbc, cmet). Dietician referral for general weight loss strategies.

## 2016-03-08 ENCOUNTER — Encounter: Payer: BC Managed Care – PPO | Attending: Gastroenterology | Admitting: Dietician

## 2016-03-08 ENCOUNTER — Encounter: Payer: Self-pay | Admitting: Dietician

## 2016-03-08 DIAGNOSIS — K50119 Crohn's disease of large intestine with unspecified complications: Secondary | ICD-10-CM | POA: Insufficient documentation

## 2016-03-08 DIAGNOSIS — Z6841 Body Mass Index (BMI) 40.0 and over, adult: Secondary | ICD-10-CM

## 2016-03-08 NOTE — Patient Instructions (Signed)
Great job with the changes that you have made. Continue with your healthy diet and avoiding sugar containing beverages. Recommend some form of exercise most days.   Make mindful choices, eat slowly, stop when you are full.

## 2016-03-08 NOTE — Progress Notes (Signed)
  Medical Nutrition Therapy:  Appt start time: 0930 end time:  1030.   Assessment:  Primary concerns today: Patient is here alone.  She would like to lose weight.  Hx includes Crohn's disease which has been stable for the past 6 years.  She had surgery about 6 years ago for this.  She has chronic high WBC.  She wants to become pregnant.  Weight hx: Lowest adult weight 160-165 lbs after Chron's flair. Weight goal:  Initial 230 lbs Highest adult weight 275 lbs this year. Today 269 lbs.  Patient lives with her husband.  She does the shopping and share cooking.  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  Her husband is content with his weight but is willing to help and she states that he is supportive.  Preferred Learning Style:   No preference indicated   Learning Readiness:   Ready  Change in progress   MEDICATIONS: see list to include MVI   DIETARY INTAKE:  Usual eating pattern includes 3 meals and 2 snacks per day. Preps on Sunday.  "If I drink enough water in the day, I do not see a need for a snack."  Changes made after visit to Dr. Ardis Hughs one month ago by decreasing sugar drinks and sweets and packing lunch more rather than buying.  She states that she is feeling better. Avoided foods include spicy foods.    24-hr recall:  B ( AM): instant oatmeal  Snk ( AM): none L ( PM): chicken, beans, rice or potatoes- was eating out one month ago Snk ( PM): occasional fruit, graham crackers D ( PM): baked meat, veges, one starch Snk ( PM): occasional dessert or other sugar snack but usually tries to avoid eating after 8:00 Beverages: water, quit bottled sweet tea, occasional fruit punch, occasional homemade sweet tea  Usual physical activity: none, enjoys dance, pilades  Estimated energy needs: 1600 calories 180 g carbohydrates 100 g protein 53 g fat  Progress Towards Goal(s):  In progress.   Nutritional Diagnosis:  NB-1.1 Food and nutrition-related knowledge deficit As  related to weight management.  As evidenced by patient report.    Intervention:  Nutrition counseling/education related to healthy eating/ for weight management.  Great job with the changes that you have made. Continue with your healthy diet and avoiding sugar containing beverages. Recommend some form of exercise most days.   Make mindful choices, eat slowly, stop when you are full.  Teaching Method Utilized:  Visual Auditory Hands on  Handouts given during visit include:  Weight loss tips  Meal plan card  My plate  Snack list  Breakfast idease  Nutrition therapy for Chron's  Barriers to learning/adherence to lifestyle change: none  Demonstrated degree of understanding via:  Teach Back   Monitoring/Evaluation:  Dietary intake, exercise, and body weight prn.

## 2016-05-29 ENCOUNTER — Other Ambulatory Visit (INDEPENDENT_AMBULATORY_CARE_PROVIDER_SITE_OTHER): Payer: BC Managed Care – PPO

## 2016-05-29 DIAGNOSIS — Z0000 Encounter for general adult medical examination without abnormal findings: Secondary | ICD-10-CM

## 2016-05-29 LAB — HEPATIC FUNCTION PANEL
ALBUMIN: 3.3 g/dL — AB (ref 3.5–5.2)
ALT: 12 U/L (ref 0–35)
AST: 13 U/L (ref 0–37)
Alkaline Phosphatase: 94 U/L (ref 39–117)
BILIRUBIN TOTAL: 0.3 mg/dL (ref 0.2–1.2)
Bilirubin, Direct: 0.1 mg/dL (ref 0.0–0.3)
Total Protein: 6.7 g/dL (ref 6.0–8.3)

## 2016-05-29 LAB — CBC WITH DIFFERENTIAL/PLATELET
BASOS PCT: 0.4 % (ref 0.0–3.0)
Basophils Absolute: 0.1 10*3/uL (ref 0.0–0.1)
Eosinophils Absolute: 0.2 10*3/uL (ref 0.0–0.7)
Eosinophils Relative: 1 % (ref 0.0–5.0)
HCT: 39.6 % (ref 36.0–46.0)
Hemoglobin: 13.2 g/dL (ref 12.0–15.0)
LYMPHS ABS: 4.4 10*3/uL — AB (ref 0.7–4.0)
Lymphocytes Relative: 25.5 % (ref 12.0–46.0)
MCHC: 33.3 g/dL (ref 30.0–36.0)
MCV: 86.7 fl (ref 78.0–100.0)
MONO ABS: 1.4 10*3/uL — AB (ref 0.1–1.0)
MONOS PCT: 8 % (ref 3.0–12.0)
Neutro Abs: 11.2 10*3/uL — ABNORMAL HIGH (ref 1.4–7.7)
Neutrophils Relative %: 65.1 % (ref 43.0–77.0)
Platelets: 431 10*3/uL — ABNORMAL HIGH (ref 150.0–400.0)
RBC: 4.57 Mil/uL (ref 3.87–5.11)
RDW: 14.1 % (ref 11.5–15.5)
WBC: 17.3 10*3/uL — ABNORMAL HIGH (ref 4.0–10.5)

## 2016-05-29 LAB — LIPID PANEL
CHOLESTEROL: 164 mg/dL (ref 0–200)
HDL: 42.3 mg/dL (ref >39.00)
LDL CALC: 106 mg/dL — AB (ref 0–99)
NonHDL: 121.97
TRIGLYCERIDES: 81 mg/dL (ref 0.0–149.0)
Total CHOL/HDL Ratio: 4
VLDL: 16.2 mg/dL (ref 0.0–40.0)

## 2016-05-29 LAB — TSH: TSH: 1.86 u[IU]/mL (ref 0.35–4.50)

## 2016-05-29 LAB — BASIC METABOLIC PANEL
BUN: 9 mg/dL (ref 6–23)
CHLORIDE: 101 meq/L (ref 96–112)
CO2: 30 mEq/L (ref 19–32)
Calcium: 8.7 mg/dL (ref 8.4–10.5)
Creatinine, Ser: 0.65 mg/dL (ref 0.40–1.20)
GFR: 134.89 mL/min (ref >60.00)
GLUCOSE: 81 mg/dL (ref 70–99)
POTASSIUM: 4 meq/L (ref 3.5–5.1)
Sodium: 137 mEq/L (ref 135–145)

## 2016-06-13 ENCOUNTER — Encounter: Payer: BC Managed Care – PPO | Admitting: Internal Medicine

## 2016-06-19 NOTE — Progress Notes (Signed)
Pre visit review using our clinic review tool, if applicable. No additional management support is needed unless otherwise documented below in the visit note.  Chief Complaint  Patient presents with  . Annual Exam    HPI: Patient  [name]  36 y.o. comes in today for Preventive Health Care visit   Had recent resp infection   And had just begum antibiotic before lab tests were done . Now is better tonsils not bothering her  .  crohns stable last remicade nov   Stopped ocps   To try for preg .  She is hesitant concern about  Complications and going throught gocy changes   Because of past hx of many surgeries.  But ok  Per her gi and obgyne  Health Maintenance  Topic Date Due  . HIV Screening  06/19/2017 (Originally 04/09/1998)  . PAP SMEAR  11/17/2016  . TETANUS/TDAP  05/23/2025  . INFLUENZA VACCINE  Addressed   Health Maintenance Review LIFESTYLE:  Exercise:   Teaching  .  Tobacco/ETS: no Alcohol:   no Sugar beverages: not as much     Has seen   Dietician and not doing this .  Sleep:  Average 7  Drug use: no   HH of    2  No pets  Work:  40   9 hours   Per day and weekend . [place] .  HS Now off ocps  married    ROS:  GEN/ HEENT: No fever, significant weight changes sweats headaches vision problems hearing changes, CV/ PULM; No chest pain shortness of breath cough, syncope,edema  change in exercise tolerance. GI /GU: No adominal pain, vomiting, change in bowel habits. No blood in the stool. No significant GU symptoms. SKIN/HEME: ,no acute skin rashes suspicious lesions or bleeding. No lymphadenopathy, nodules, masses.  NEURO/ PSYCH:  No neurologic signs such as weakness numbness. No depression anxiety. IMM/ Allergy: No unusual infections.  Allergy .   REST of 12 system review negative except as per HPI   Past Medical History:  Diagnosis Date  . Acute swimmers' ear   . Chronic rhinitis   . Contact dermatitis and other eczema, due to unspecified cause   .  Crohn's 11-15-11   no issues in 2 yrs  . Fibroids 11-15-11   remains with fibroids  . Hernia   . Incisional hernia, RLQ 10/02/2011  . Mononucleosis   . Obesity   . Pilonidal abscess   . Pneumonia   . Rectal abscess   . 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[place])   . THYROID FUNCTION TEST, ABNORMAL 04/18/2010   Qualifier: Diagnosis of  By: Regis Bill MD, [name]     Past Surgical History:  Procedure Laterality Date  . APPENDECTOMY  7/06  . EYE SURGERY  11-15-11   2003-multiple stys  . ileocecal resection  1/11   crohns disease with fisula/stricture. Dr. Ronnald Collum  . KNEE ARTHROSCOPY  99, 02   Bil.  [name] MYOMECTOMY N/A 11/10/2014   Procedure: MYOMECTOMY;  Surgeon: Everett Graff, MD;  Location: [facility];  Service: Gynecology;  Laterality: N/A;  . PILONIDAL CYST EXCISION  2011, 2012   I&Ds  . VENTRAL HERNIA REPAIR  11/27/2011   Procedure: LAPAROSCOPIC VENTRAL HERNIA;  Surgeon: Adin Hector, MD;  Location: [facility];  Service: General;  Laterality: N/A;  Laparoscopic Exploration & Repair of Hernia in Abdomen  . WISDOM TOOTH EXTRACTION      Family History  Problem Relation Age of Onset  .  Aneurysm Mother     brain  . Heart disease Mother 36    heart anyrism  . Healthy Father     fairly  . Diabetes Sister     half sister  . Bipolar disorder Sister     half sister  . Colon cancer Maternal Grandmother   . Stomach cancer Paternal Grandfather   . Multiple sclerosis Sister     Social History   Social History  . Marital status: Married    Spouse name: N/A  . Number of children: N/A  . Years of education: N/A   Occupational History  . math teacher    Social History Main Topics  . Smoking status: Never Smoker  . Smokeless tobacco: Never Used  . Alcohol use No  . Drug use: No  . Sexual activity: Not Asked   Other Topics Concern  . 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 Calcium Carb-Cholecalciferol (CALCIUM 1000 + D) 1000-800 MG-UNIT TABS Take 1 tablet by mouth daily.     . flintstones complete (FLINTSTONES) 60 MG chewable tablet Chew 1 tablet by mouth daily.    [name] inFLIXimab in sodium chloride 0.9 % Inject into the vein every 8 (eight) weeks. Remicaide Infusion: For Chron's @@ [facility]    . SALINE NASAL SPRAY NA Place 1 spray into the nose daily.    . drospirenone-ethinyl estradiol (YAZ,GIANVI,LORYNA) 3-0.02 MG tablet Take 1 tablet by mouth daily.       No facility-administered medications prior to visit.      EXAM:  BP 118/80 (BP Location: Right Arm, Patient Position: Sitting, Cuff Size: Large)   Temp 97.6 F (36.4 C) (Oral)   Ht 5' 3"  (1.6 m)   Wt 270 lb (122.5 kg)   BMI 47.83 kg/m   Body mass index is 47.83 kg/m.  Physical Exam: Vital signs reviewed NOM:VEHM is a well-developed well-nourished alert cooperative    who appearsr stated age in no acute distress.  HEENT: normocephalic atraumatic , Eyes: PERRL EOM's full, conjunctiva clear, Nares: paten,t no deformity discharge or tenderness., Ears: no deformity EAC's clear TMs with normal landmarks. Mouth: clear OP, no lesions, edema  Tonsils 2 + l;eft more than right airway patent.  Moist mucous membranes. Dentition in adequate repair. NECK: supple without masses, thyromegaly or bruits. [place] ac nodes  CHEST/PULM:  Clear to auscultation and percussion breath sounds equal no wheeze , rales or rhonchi. No chest wall deformities or tenderness. CV: PMI is nondisplaced, S1 S2 no gallops, murmurs, rubs. Peripheral pulses are full without delay.No JVD .  ABDOMEN: Bowel sounds normal nontender  No guard or rebound, no hepato splenomegal no CVA tenderness.  No hernia. Breast exam deferred done per gyne Extremtities:  No clubbing  cyanosis or edema, no acute joint swelling or redness no focal atrophy NEURO:  Oriented x3, cranial nerves 3-12 appear to be intact, no obvious focal weakness,gait within normal limits no abnormal reflexes or asymmetrical SKIN: No acute rashes normal turgor, color, no bruising or petechiae. PSYCH: Oriented, good eye contact, no obvious depression anxiety, cognition and judgment appear normal. LN: no cervical axillary inguinal adenopathy  Lab  Results  Component Value Date   WBC 17.3 (H) 05/29/2016   HGB 13.2 05/29/2016   HCT 39.6 05/29/2016   PLT 431.0 (H) 05/29/2016   GLUCOSE 81 05/29/2016   CHOL 164 05/29/2016   TRIG 81.0 05/29/2016   HDL 42.30 05/29/2016   LDLCALC 106 (H) 05/29/2016   ALT 12 05/29/2016   AST 13 05/29/2016   NA 137 05/29/2016   K 4.0 05/29/2016   CL 101 05/29/2016   CREATININE 0.65 05/29/2016   BUN 9 05/29/2016   CO2 30 05/29/2016   TSH 1.86 05/29/2016   INR 1.47 05/26/2009   Wt Readings from Last 3 Encounters:  06/20/16 270 lb (122.5 kg)  03/08/16 269 lb (122 kg)  02/10/16 275 lb 6 oz (124.9 kg)   BP Readings from Last 3 Encounters:  06/20/16 118/80  02/10/16 102/70  05/24/15 110/70     ASSESSMENT AND PLAN:  Discussed the following assessment and plan:  Visit for preventive health examination  High risk medication use - remicade   Crohn's disease of colon with complication ([place])  TONSILLAR HYPERTROPHY - recurrent tonsil infection but gets better in  interim wants to avoid surgery   Morbid obesity with body mass index of 40.0-44.9 in adult ([place]) -  ont nutrition adovce advise weight waqtcher help.  Disc  preconceptions  And  Risk benefit of pregnancy etc.    Self care attendance    Healthy eating and weigh loss will help  Suspect that the elevated WBC with neutrophilia was related to her respiratory infection just treated with antibiotics at the time of the blood draw. She appears to be well now. Will follow if recurrent problem. Patient Care  Team: Burnis Medin, MD as PCP - General Milus Banister, [name], [name], MD (Rheumatology) Patient Instructions  Glad you are doing well.  I suppose the elevated WBC was from the infection you had .    4 weeks after remicade come for  The second  Pneumonia vaccine Make sure you are taking    Folic acid supplements to avoid  Birth defects  .   Try joining weight  watchers   Also  They have some good apps and tracking systems to help  You  Eat healthy and lose weight.     [name]. Trinty Marken M.D.

## 2016-06-20 ENCOUNTER — Ambulatory Visit (INDEPENDENT_AMBULATORY_CARE_PROVIDER_SITE_OTHER): Payer: BC Managed Care – PPO | Admitting: Internal Medicine

## 2016-06-20 ENCOUNTER — Encounter: Payer: Self-pay | Admitting: Internal Medicine

## 2016-06-20 VITALS — BP 118/80 | Temp 97.6°F | Ht 63.0 in | Wt 270.0 lb

## 2016-06-20 DIAGNOSIS — J351 Hypertrophy of tonsils: Secondary | ICD-10-CM | POA: Diagnosis not present

## 2016-06-20 DIAGNOSIS — K50119 Crohn's disease of large intestine with unspecified complications: Secondary | ICD-10-CM | POA: Diagnosis not present

## 2016-06-20 DIAGNOSIS — Z0000 Encounter for general adult medical examination without abnormal findings: Secondary | ICD-10-CM

## 2016-06-20 DIAGNOSIS — Z79899 Other long term (current) drug therapy: Secondary | ICD-10-CM | POA: Diagnosis not present

## 2016-06-20 DIAGNOSIS — Z6841 Body Mass Index (BMI) 40.0 and over, adult: Secondary | ICD-10-CM

## 2016-06-20 NOTE — Patient Instructions (Addendum)
Glad you are doing well.  I suppose the elevated WBC was from the infection you had .    4 weeks after remicade come for  The second  Pneumonia vaccine Make sure you are taking    Folic acid supplements to avoid  Birth defects  .   Try joining weight  watchers   Also  They have some good apps and tracking systems to help  You  Eat healthy and lose weight.

## 2017-02-15 ENCOUNTER — Encounter: Payer: Self-pay | Admitting: Internal Medicine

## 2017-02-15 ENCOUNTER — Encounter: Payer: Self-pay | Admitting: Gastroenterology

## 2017-02-15 ENCOUNTER — Ambulatory Visit (INDEPENDENT_AMBULATORY_CARE_PROVIDER_SITE_OTHER): Payer: BC Managed Care – PPO | Admitting: Gastroenterology

## 2017-02-15 VITALS — BP 110/62 | HR 103 | Ht 62.0 in | Wt 291.0 lb

## 2017-02-15 DIAGNOSIS — K5000 Crohn's disease of small intestine without complications: Secondary | ICD-10-CM

## 2017-02-15 NOTE — Progress Notes (Signed)
Review of gastrointestinal problems:  1. Crohn ileo(colitis).Presented like an acute appendicitis 2006. Surgery, not in [place], was much more suggestive of Crohn's disease. 02/2005 colonoscopy Dr. Ardis Hughs: rectum to cecum inflammation (erosions, granularity, TI nodular, friable, ulcerated. Path showed chronic, active TI inflammation; normal colon biopsies.  She did not respond to immunomodulators and so eventually she Began Remicade April 2007 While continuing on azathioprine. Early 2008, stopped azathioprine and she tolerated solo maintenance Remicade (5 mg per kilogram) well. April 2009 continues to tolerate Remicade infusions very well, getting her infusions at Dr. Tanna Furry office. 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 Her Remicade dosing was increased to 10 mg per kilogram. Winter, 2010 presented with right lower quadrant abscess, clear fistula connection between small bowel and the abscess. 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 Infection was drained with interventional radiology, drain eventually pulled however abscess recurred within days and so right lower quadrant drain was replaced. January, 2011: she is on azathioprine 100 mg a day, Augmentin twice daily, planning for surgery late January. January, 2011: ileocecectomy performed for active disease, persistent fistula, abscess. April, 2012 doing well on Remicade every 8 weeks.  10/2012 doing well.  01/2016 doing well on remicade 1m/kg every 8 weeks Dr. BMelissa Noonoffice. 2. May, 2011: polynidol cystdrained by urgent care; drained another time 2011. eventually surgically resected spring 2012 3. Morbid obesity; BMI 49 2017; BMI 53 2018    Blood work July 2018 CBC shows white blood cell count 11.8 thousand, hemoglobin 12.5, platelets 385. Complete metabolic profile was normal.  HPI: This is a  very pleasant  36year old woman my last saw about a year ago she is continuing to get Remicade 10 mg/kg every 8 weeks for her Crohn's ileitis.  Her bowels have been perfectly normal 1-2 soft solid brown bowel movements a day without any bleeding. She has no abdominal pains.  She has been bothered by some minor skin fungal infections on her feet. Is also been bothered by a lot of mosquito bites this summer.  Chief complaint is Crohn's ileitis  She has gained about 16 pounds in the past year. Her BMI is now 53  ROS: complete GI ROS as described in HPI, all other review negative.  Constitutional:  No unintentional weight loss   Past Medical History:  Diagnosis Date  . Acute swimmers' ear   . Chronic rhinitis   . Contact dermatitis and other eczema, due to unspecified cause   . Crohn's 11-15-11   no issues in 2 yrs  . Fibroids 11-15-11   remains with fibroids  . Hernia   . Incisional hernia, RLQ 10/02/2011  . Mononucleosis   . Obesity   . Pilonidal abscess   . Pneumonia   . Rectal abscess   . 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   . THYROID FUNCTION TEST, ABNORMAL 04/18/2010   Qualifier: Diagnosis of  By: PRegis BillMD, WStandley Brooking    Past Surgical History:  Procedure Laterality Date  . APPENDECTOMY  7/06  . EYE SURGERY  11-15-11   2003-multiple stys  . ileocecal resection  1/11   crohns disease with fisula/stricture. Dr. ARonnald Collum . KNEE ARTHROSCOPY  99, 02   Bil.  .Marland KitchenMYOMECTOMY N/A 11/10/2014   Procedure: MYOMECTOMY;  Surgeon: AEverett Graff MD;  [facility];  Service: Gynecology;  Laterality: N/A;  . PILONIDAL CYST EXCISION  2011, 2012  I&Ds  . VENTRAL HERNIA REPAIR  11/27/2011   Procedure: LAPAROSCOPIC VENTRAL HERNIA;  Surgeon: Adin Hector, MD;  Location[facility];  Service: General;  Laterality: N/A;  Laparoscopic Exploration & Repair of Hernia in Abdomen  . WISDOM TOOTH EXTRACTION      Current Outpatient Prescriptions  Medication Sig Dispense Refill  . Calcium  Carb-Cholecalciferol (CALCIUM 1000 + D) 1000-800 MG-UNIT TABS Take 1 tablet by mouth daily.     [name] inFLIXimab in sodium chloride 0.9 % Inject into the vein every 8 (eight) weeks. Remicaide Infusion: For Chron's @@ [facility]    . Prenatal MV-Min-Fe Fum-FA-DHA (PRENATAL 1 PO) Take by mouth.    [name] SALINE NASAL SPRAY NA Place 1 spray into the nose daily.     No current facility-administered medications for this visit.     Allergies as of 02/15/2017  . (No Known Allergies)    Family History  Problem Relation Age of Onset  . Aneurysm Mother        brain  . Heart disease Mother 78       heart anyrism  . Healthy Father        fairly  . Diabetes Sister        half sister  . Bipolar disorder Sister        half sister  . Colon cancer Maternal Grandmother   . Stomach cancer Paternal Grandfather   . Multiple sclerosis Sister     Social History   Social History  . Marital status: Married    Spouse name: N/A  . Number of children: N/A  . Years of education: N/A   Occupational History  . math teacher    Social History Main Topics  . Smoking status: Never Smoker  . Smokeless tobacco: Never Used  . Alcohol use No  . Drug use: No  . Sexual activity: Yes    Partners: Male   Other Topics Concern  . 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 female with Crohn's ileitis  She is on  maintenance Remicade at 10 mg/kg and that has been the case for 6 or 7 years now. Her last colonoscopy was in 2006 I recommended we repeat that now. If she has no sign of active inflammation in any of her examined colon, terminal ileum and we discussed possibly trying to turn her Remicade down to 5 mg/kg again. I also explained to her that there are newer biologic agents that have much better side effect profile than infliximab. In particular Stelara has very good efficacy for Crohn's disease and has much less serious side effects involving immune system.  Please see the "Patient Instructions" section for addition details about the plan.  Owens Loffler, MD [facility] 02/15/2017, 3:59 PM

## 2017-02-15 NOTE — Patient Instructions (Addendum)
You will be set up for a colonoscopy ([facility] with MAC sedation).  Call on Monday and we can get you set up for any Thursday in October.  Normal BMI (Body Mass Index- based on height and weight) is between 19 and 25. Your BMI today is Body mass index is 53.22 kg/m. [name] Please consider follow up  regarding your BMI with your Primary Care Provider.

## 2017-02-18 ENCOUNTER — Other Ambulatory Visit: Payer: Self-pay

## 2017-02-18 ENCOUNTER — Telehealth: Payer: Self-pay

## 2017-02-18 DIAGNOSIS — K50119 Crohn's disease of large intestine with unspecified complications: Secondary | ICD-10-CM

## 2017-02-18 MED ORDER — NA SULFATE-K SULFATE-MG SULF 17.5-3.13-1.6 GM/177ML PO SOLN: 1.0000 | Freq: Once | ORAL | 0 refills | Status: AC

## 2017-02-18 NOTE — Telephone Encounter (Signed)
The pt has been scheduled for colon on 03/07/17 and notified via My Chart

## 2017-02-18 NOTE — Telephone Encounter (Signed)
The pt called to advise she is ready to set up colon at [facility] for 03/07/17.

## 2017-02-22 ENCOUNTER — Ambulatory Visit: Payer: BC Managed Care – PPO | Admitting: Gastroenterology

## 2017-03-04 ENCOUNTER — Telehealth: Payer: Self-pay | Admitting: Gastroenterology

## 2017-03-04 NOTE — Telephone Encounter (Signed)
Left message on machine to call back  

## 2017-03-04 NOTE — Telephone Encounter (Signed)
The pt called to state she was put on an abx for a "sore" on her arm and wants to make sure she can continue with colon as planned.  She was advised that as long as she has no respiratory complaints and no fever she can proceed as planned.

## 2017-03-05 ENCOUNTER — Encounter (HOSPITAL_COMMUNITY): Payer: Self-pay | Admitting: *Deleted

## 2017-03-05 NOTE — Progress Notes (Signed)
Patient stated she ate some corn last night, patient instructed to call dr Ardis Hughs and let him know.

## 2017-03-07 ENCOUNTER — Encounter (HOSPITAL_COMMUNITY)
Admission: RE | Disposition: A | Discharge status: Home / Self Care | Payer: Self-pay | Source: Ambulatory Visit | Attending: Gastroenterology

## 2017-03-07 ENCOUNTER — Encounter (HOSPITAL_COMMUNITY): Payer: Self-pay | Admitting: Emergency Medicine

## 2017-03-07 ENCOUNTER — Ambulatory Visit (HOSPITAL_COMMUNITY)
Admission: RE | Admit: 2017-03-07 | Discharge: 2017-03-07 | Disposition: A | Discharge status: Home / Self Care | Payer: BC Managed Care – PPO | Source: Ambulatory Visit | Attending: Gastroenterology | Admitting: Gastroenterology

## 2017-03-07 ENCOUNTER — Ambulatory Visit (HOSPITAL_COMMUNITY): Payer: BC Managed Care – PPO | Admitting: Anesthesiology

## 2017-03-07 DIAGNOSIS — Z980 Intestinal bypass and anastomosis status: Secondary | ICD-10-CM | POA: Insufficient documentation

## 2017-03-07 DIAGNOSIS — K5000 Crohn's disease of small intestine without complications: Secondary | ICD-10-CM | POA: Diagnosis present

## 2017-03-07 DIAGNOSIS — Z9049 Acquired absence of other specified parts of digestive tract: Secondary | ICD-10-CM | POA: Insufficient documentation

## 2017-03-07 DIAGNOSIS — K50119 Crohn's disease of large intestine with unspecified complications: Secondary | ICD-10-CM

## 2017-03-07 DIAGNOSIS — Z6841 Body Mass Index (BMI) 40.0 and over, adult: Secondary | ICD-10-CM | POA: Diagnosis not present

## 2017-03-07 DIAGNOSIS — K633 Ulcer of intestine: Secondary | ICD-10-CM

## 2017-03-07 HISTORY — DX: Anemia, unspecified: D64.9

## 2017-03-07 HISTORY — DX: Local infection of the skin and subcutaneous tissue, unspecified: L08.9

## 2017-03-07 HISTORY — DX: Abrasion of left upper arm, sequela: S40.812S

## 2017-03-07 HISTORY — PX: COLONOSCOPY WITH PROPOFOL: SHX5780

## 2017-03-07 SURGERY — COLONOSCOPY WITH PROPOFOL
Anesthesia: Monitor Anesthesia Care

## 2017-03-07 MED ORDER — PROPOFOL 10 MG/ML IV BOLUS
INTRAVENOUS | Status: AC
  Filled 2017-03-07: qty 20

## 2017-03-07 MED ORDER — LIDOCAINE 2% (20 MG/ML) 5 ML SYRINGE
INTRAMUSCULAR | Status: AC
  Filled 2017-03-07: qty 5

## 2017-03-07 MED ORDER — PROPOFOL 10 MG/ML IV BOLUS
INTRAVENOUS | Status: DC | PRN
  Administered 2017-03-07: 20 mg via INTRAVENOUS

## 2017-03-07 MED ORDER — PROPOFOL 500 MG/50ML IV EMUL
INTRAVENOUS | Status: DC | PRN
  Administered 2017-03-07: 140 ug/kg/min via INTRAVENOUS

## 2017-03-07 MED ORDER — LIDOCAINE 2% (20 MG/ML) 5 ML SYRINGE
INTRAMUSCULAR | Status: DC | PRN
  Administered 2017-03-07: 100 mg via INTRAVENOUS

## 2017-03-07 MED ORDER — SODIUM CHLORIDE 0.9 % IV SOLN: INTRAVENOUS | Status: DC

## 2017-03-07 MED ORDER — PROPOFOL 10 MG/ML IV BOLUS
INTRAVENOUS | Status: AC
  Filled 2017-03-07: qty 40

## 2017-03-07 MED ORDER — LACTATED RINGERS IV SOLN
INTRAVENOUS | Status: DC
  Administered 2017-03-07: 09:00:00 via INTRAVENOUS

## 2017-03-07 SURGICAL SUPPLY — 22 items

## 2017-03-07 NOTE — Anesthesia Procedure Notes (Signed)
Date/Time: 03/07/2017 8:56 AM Performed by: Danley Danker L Oxygen Delivery Method: Simple face mask

## 2017-03-07 NOTE — Anesthesia Preprocedure Evaluation (Addendum)
Anesthesia Evaluation  Patient identified by MRN, date of birth, ID band Patient awake    Reviewed: Allergy & Precautions, NPO status , Patient's Chart, lab work & pertinent test results, reviewed documented beta blocker date and time   Airway Mallampati: II   Neck ROM: Full    Dental  (+) Teeth Intact, Dental Advisory Given   Pulmonary    Pulmonary exam normal        Cardiovascular negative cardio ROS Normal cardiovascular exam     Neuro/Psych  Headaches,    GI/Hepatic Neg liver ROS, Chron's   Endo/Other  Morbid obesity  Renal/GU negative Renal ROS   Fibroids    Musculoskeletal   Abdominal (+) + obese,   Peds  Hematology 13/39   Anesthesia Other Findings Super obese  Reproductive/Obstetrics                             Anesthesia Physical  Anesthesia Plan  ASA: IV  Anesthesia Plan: MAC   Post-op Pain Management:    Induction: Intravenous  PONV Risk Score and Plan: 2 and Propofol infusion and Treatment may vary due to age or medical condition  Airway Management Planned: Natural Airway  Additional Equipment:   Intra-op Plan:   Post-operative Plan:   Informed Consent: I have reviewed the patients History and Physical, chart, labs and discussed the procedure including the risks, benefits and alternatives for the proposed anesthesia with the patient or authorized representative who has indicated his/her understanding and acceptance.     Plan Discussed with: CRNA  Anesthesia Plan Comments:         Anesthesia Quick Evaluation

## 2017-03-07 NOTE — H&P (View-Only) (Signed)
Review of gastrointestinal problems:  1. Crohn ileo(colitis).Presented like an acute appendicitis 2006. Surgery, not in [place], was much more suggestive of Crohn's disease. 02/2005 colonoscopy Dr. Ardis Hughs: rectum to cecum inflammation (erosions, granularity, TI nodular, friable, ulcerated. Path showed chronic, active TI inflammation; normal colon biopsies.  She did not respond to immunomodulators and so eventually she Began Remicade April 2007 While continuing on azathioprine. Early 2008, stopped azathioprine and she tolerated solo maintenance Remicade (5 mg per kilogram) well. April 2009 continues to tolerate Remicade infusions very well, getting her infusions at Dr. Tanna Furry office. 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 Her Remicade dosing was increased to 10 mg per kilogram. Winter, 2010 presented with right lower quadrant abscess, clear fistula connection between small bowel and the abscess. 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 Infection was drained with interventional radiology, drain eventually pulled however abscess recurred within days and so right lower quadrant drain was replaced. January, 2011: she is on azathioprine 100 mg a day, Augmentin twice daily, planning for surgery late January. January, 2011: ileocecectomy performed for active disease, persistent fistula, abscess. April, 2012 doing well on Remicade every 8 weeks.  10/2012 doing well.  01/2016 doing well on remicade 38m/kg every 8 weeks Dr. BMelissa Noonoffice. 2. May, 2011: polynidol cystdrained by urgent care; drained another time 2011. eventually surgically resected spring 2012 3. Morbid obesity; BMI 49 2017; BMI 53 2018    Blood work July 2018 CBC shows white blood cell count 11.8 thousand, hemoglobin 12.5, platelets 385. Complete metabolic profile was normal.  HPI: This is a  very pleasant  36year old woman my last saw about a year ago she is continuing to get Remicade 10 mg/kg every 8 weeks for her Crohn's ileitis.  Her bowels have been perfectly normal 1-2 soft solid brown bowel movements a day without any bleeding. She has no abdominal pains.  She has been bothered by some minor skin fungal infections on her feet. Is also been bothered by a lot of mosquito bites this summer.  Chief complaint is Crohn's ileitis  She has gained about 16 pounds in the past year. Her BMI is now 53  ROS: complete GI ROS as described in HPI, all other review negative.  Constitutional:  No unintentional weight loss   Past Medical History:  Diagnosis Date  . Acute swimmers' ear   . Chronic rhinitis   . Contact dermatitis and other eczema, due to unspecified cause   . Crohn's 11-15-11   no issues in 2 yrs  . Fibroids 11-15-11   remains with fibroids  . Hernia   . Incisional hernia, RLQ 10/02/2011  . Mononucleosis   . Obesity   . Pilonidal abscess   . Pneumonia   . Rectal abscess   . 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[place]   . THYROID FUNCTION TEST, ABNORMAL 04/18/2010   Qualifier: Diagnosis of  By: PRegis BillMD, WStandley Brooking    Past Surgical History:  Procedure Laterality Date  . APPENDECTOMY  7/06  . EYE SURGERY  11-15-11   2003-multiple stys  . ileocecal resection  1/11   crohns disease with fisula/stricture. Dr. ARonnald Collum . KNEE ARTHROSCOPY  99, 02   Bil.  .Marland KitchenMYOMECTOMY N/A 11/10/2014   Procedure: MYOMECTOMY;  Surgeon: AEverett Graff MD;  [facility];  Service: Gynecology;  Laterality: N/A;  . PILONIDAL CYST EXCISION  2011, 2012  I&Ds  . VENTRAL HERNIA REPAIR  11/27/2011   Procedure: LAPAROSCOPIC VENTRAL HERNIA;  Surgeon: Adin Hector, MD;  Location[facility];  Service: General;  Laterality: N/A;  Laparoscopic Exploration & Repair of Hernia in Abdomen  . WISDOM TOOTH EXTRACTION      Current Outpatient Prescriptions  Medication Sig Dispense Refill  . Calcium  Carb-Cholecalciferol (CALCIUM 1000 + D) 1000-800 MG-UNIT TABS Take 1 tablet by mouth daily.     [name] inFLIXimab in sodium chloride 0.9 % Inject into the vein every 8 (eight) weeks. Remicaide Infusion: For Chron's @@ [facility]    . Prenatal MV-Min-Fe Fum-FA-DHA (PRENATAL 1 PO) Take by mouth.    [name] SALINE NASAL SPRAY NA Place 1 spray into the nose daily.     No current facility-administered medications for this visit.     Allergies as of 02/15/2017  . (No Known Allergies)    Family History  Problem Relation Age of Onset  . Aneurysm Mother        brain  . Heart disease Mother 43       heart anyrism  . Healthy Father        fairly  . Diabetes Sister        half sister  . Bipolar disorder Sister        half sister  . Colon cancer Maternal Grandmother   . Stomach cancer Paternal Grandfather   . Multiple sclerosis Sister     Social History   Social History  . Marital status: Married    Spouse name: N/A  . Number of children: N/A  . Years of education: N/A   Occupational History  . math teacher    Social History Main Topics  . Smoking status: Never Smoker  . Smokeless tobacco: Never Used  . Alcohol use No  . Drug use: No  . Sexual activity: Yes    Partners: Male   Other Topics Concern  . 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 female with Crohn's ileitis  She is on  maintenance Remicade at 10 mg/kg and that has been the case for 6 or 7 years now. Her last colonoscopy was in 2006 I recommended we repeat that now. If she has no sign of active inflammation in any of her examined colon, terminal ileum and we discussed possibly trying to turn her Remicade down to 5 mg/kg again. I also explained to her that there are newer biologic agents that have much better side effect profile than infliximab. In particular Stelara has very good efficacy for Crohn's disease and has much less serious side effects involving immune system.  Please see the "Patient Instructions" section for addition details about the plan.  Owens Loffler, MD [facility] 02/15/2017, 3:59 PM

## 2017-03-07 NOTE — Op Note (Signed)
[facility] Patient Name: [name] Procedure Date: 03/07/2017 MRN: [id number] Attending MD: Milus Banister , MD Date of Birth: 25-Mar-1981 CSN: [id number] Age: 36 Admit Type: Outpatient Procedure:                Colonoscopy Indications:              Crohn's disease of the small bowel, Disease                            activity assessment of Crohn's disease of the small                            bowel Providers:                Milus Banister, MD, Elmer Ramp. Tilden Dome, RN,                            William Dalton, Technician Referring MD:              Medicines:                Monitored Anesthesia Care Complications:            No immediate complications. Estimated blood loss:                            None. Estimated Blood Loss:     Estimated blood loss: none. Procedure:                Pre-Anesthesia Assessment:                           - Prior to the procedure, a History and Physical                            was performed, and patient medications and                            allergies were reviewed. The patient's tolerance of                            previous anesthesia was also reviewed. The risks                            and benefits of the procedure and the sedation                            options and risks were discussed with the patient.                            All questions were answered, and informed consent                            was obtained. Prior Anticoagulants: The patient has                            taken no previous anticoagulant  or antiplatelet                            agents. ASA Grade Assessment: III - A patient with                            severe systemic disease. After reviewing the risks                            and benefits, the patient was deemed in                            satisfactory condition to undergo the procedure.                           After obtaining informed consent, the colonoscope                   was passed under direct vision. Throughout the                            procedure, the patient's blood pressure, pulse, and                            oxygen saturations were monitored continuously. The                            EC-[id number] ([id number]) scope was introduced through                            the anus and advanced to the the ileocolonic                            anastomosis. The colonoscopy was performed without                            difficulty. The patient tolerated the procedure                            well. The quality of the bowel preparation was                            excellent. The rectum was photographed. Scope In: 9:03:13 AM Scope Out: 9:18:24 AM Scope Withdrawal Time: 0 hours 7 minutes 5 seconds  Total Procedure Duration: 0 hours 15 minutes 11 seconds  Findings:      The 2011 ileocectomy anastomosis was widely patent. Just proximal to the       IC anastomosis there were 5-6 puctate erosions and the surrounding       mucosa was slightly inflammed. The more proximal neo-terminal ileum       mucosa was normal (to about 10cm from the anastomosis). The area of the       erosions was biospied.      The colon mucosa was normal throughout, random biopsies were taken.      The exam was otherwise without abnormality on direct and retroflexion  views. Impression:               - Widely patent IC anastomosis from 2011                            ileocectomy.                           - A few small erosions and mild inflammation just                            proximal to the IC anastomosis, biopsied.                           - Normal colonic mucosa, biopsied. Moderate Sedation:      N/A- Per Anesthesia Care Recommendation:           - Patient has a contact number available for                            emergencies. The signs and symptoms of potential                            delayed complications were discussed with the                             patient. Return to normal activities tomorrow.                            Written discharge instructions were provided to the                            patient.                           - Resume previous diet.                           - Continue present medications.                           - Await final pathology results. Given macroscopic                            inflammation, will likely recommend no change in                            your biologic dosing (53m/kg every 8 weeks). Procedure Code(s):        --- Professional ---                           [id number] Colonoscopy, flexible; with biopsy, single                            or multiple Diagnosis Code(s):        --- Professional ---  K63.3, Ulcer of intestine                           K50.00, Crohn's disease of small intestine without                            complications CPT copyright 2016 American Medical Association. All rights reserved. The codes documented in this report are preliminary and upon coder review may  be revised to meet current compliance requirements. Milus Banister, MD 03/07/2017 9:28:34 AM This report has been signed electronically. Number of Addenda: 0

## 2017-03-07 NOTE — Anesthesia Postprocedure Evaluation (Signed)
Anesthesia Post Note  Patient: [name]  Procedure(s) Performed: COLONOSCOPY WITH PROPOFOL (N/A )     Patient location during evaluation: PACU Anesthesia Type: MAC Level of consciousness: awake and alert Pain management: pain level controlled Vital Signs Assessment: post-procedure vital signs reviewed and stable Respiratory status: spontaneous breathing, nonlabored ventilation, respiratory function stable and patient connected to nasal cannula oxygen Cardiovascular status: stable and blood pressure returned to baseline Postop Assessment: no apparent nausea or vomiting Anesthetic complications: no    Last Vitals:  Vitals:   03/07/17 0940 03/07/17 0945  BP: (!) 99/51 (!) 115/52  Pulse: 81 72  Resp: (!) 22 (!) 26  Temp:    SpO2: 99% 99%    Last Pain:  Vitals:   03/07/17 0926  TempSrc: Oral                 Unknown Flannigan P Treasure [place]

## 2017-03-07 NOTE — Transfer of Care (Signed)
Immediate Anesthesia Transfer of Care Note  Patient: [name]  Procedure(s) Performed: COLONOSCOPY WITH PROPOFOL (N/A )  Patient Location: Endoscopy Unit  Anesthesia Type:MAC  Level of Consciousness: awake  Airway & Oxygen Therapy: Patient Spontanous Breathing and Patient connected to face mask oxygen  Post-op Assessment: Report given to RN and Post -op Vital signs reviewed and stable  Post vital signs: Reviewed and stable  Last Vitals:  Vitals:   03/07/17 0813  BP: 120/61  Pulse: 76  Resp: 17  Temp: 36.7 C  SpO2: 98%    Last Pain:  Vitals:   03/07/17 0813  TempSrc: Oral         Complications: No apparent anesthesia complications

## 2017-03-07 NOTE — Interval H&P Note (Signed)
History and Physical Interval Note:  03/07/2017 8:45 AM  [name]  has presented today for surgery, with the diagnosis of crohns   The various methods of treatment have been discussed with the patient and family. After consideration of risks, benefits and other options for treatment, the patient has consented to  Procedure(s): COLONOSCOPY WITH PROPOFOL (N/A) as a surgical intervention .  The patient's history has been reviewed, patient examined, no change in status, stable for surgery.  I have reviewed the patient's chart and labs.  Questions were answered to the patient's satisfaction.     Milus Banister

## 2017-03-07 NOTE — Discharge Instructions (Signed)

## 2017-03-08 ENCOUNTER — Encounter (HOSPITAL_COMMUNITY): Payer: Self-pay | Admitting: Gastroenterology

## 2017-07-04 ENCOUNTER — Encounter: Payer: BC Managed Care – PPO | Admitting: Internal Medicine

## 2017-07-08 ENCOUNTER — Encounter: Payer: BC Managed Care – PPO | Admitting: Internal Medicine

## 2017-07-22 NOTE — Progress Notes (Signed)
Chief Complaint  Patient presents with  . Annual Exam    Pt is trying to get pregnant / having cough x 1 mo / Discuss [place] [facility] program    HPI: Patient  [name]  37 y.o. comes in today for [place] visit  And above   Cough irritative upper  Chest no fever cp sob fever   crohns  still   On remicade.  Stable   Fertility rx and weight gain   Ovulation medicine . So far.   Asks about referral     Health Maintenance  Topic Date Due  . HIV Screening  04/09/1998  . PAP SMEAR  01/27/2020  . TETANUS/TDAP  05/23/2025  . INFLUENZA VACCINE  Completed   Health Maintenance Review LIFESTYLE:  Exercise:   Play wii to begin this .  2 x per week .  Tobacco/ETS: no Alcohol:   no Sugar beverages: no Sleep: about 6 -7 Drug use: no HH of  2 no pets  Work: HS  ROS:  GEN/ HEENT: No fever, significant weight changes sweats headaches vision problems hearing changes, CV/ PULM; No chest pain shortness of breath cough, syncope,edema  change in exercise tolerance. GI /GU: No adominal pain, vomiting, change in bowel habits. No blood in the stool. No significant GU symptoms. SKIN/HEME: ,no acute skin rashes suspicious lesions or bleeding. No lymphadenopathy, nodules, masses.  NEURO/ PSYCH:  No neurologic signs such as weakness numbness. No depression anxiety. IMM/ Allergy: No unusual infections.  Allergy .   REST of 12 system review negative except as per HPI   Past Medical History:  Diagnosis Date  . Abrasion of upper arm with infection, left, sequela    started antibiotics lef arm oct 4th started antibiotics healing well  . Anemia    hx of 2 yrs ago  . Chronic rhinitis   . Contact dermatitis and other eczema, due to unspecified cause   . Crohn's 11-15-11   no issues in 2 yrs  . Fibroids 11-15-11   remains with fibroids  . Hernia   . Incisional hernia, RLQ 10/02/2011  . Mononucleosis   . Obesity   . Pilonidal abscess   . Rectal abscess   . Regional  enteritis of small intestine [facility])     Past Surgical History:  Procedure Laterality Date  . APPENDECTOMY  7/06  . COLONOSCOPY WITH PROPOFOL N/A 03/07/2017   Procedure: COLONOSCOPY WITH PROPOFOL;  Surgeon: Milus Banister, MD;  Location: [facility];  Service: Endoscopy;  Laterality: N/A;  . EYE SURGERY  11-15-11   2003-multiple stys  . ileocecal resection  1/11   crohns disease with fisula/stricture. Dr. Ronnald Collum  . KNEE ARTHROSCOPY  99, 02   Bil.  [name] MYOMECTOMY N/A 11/10/2014   Procedure: MYOMECTOMY;  Surgeon: Everett Graff, MD;  Location: [place] [facility];  Service: Gynecology;  Laterality: N/A;  . PILONIDAL CYST EXCISION  2011, 2012   I&Ds  . VENTRAL HERNIA REPAIR  11/27/2011   Procedure: LAPAROSCOPIC VENTRAL HERNIA;  Surgeon: Adin Hector, MD;  Location: [facility];  Service: General;  Laterality: N/A;  Laparoscopic Exploration & Repair of Hernia in Abdomen  . WISDOM TOOTH EXTRACTION      Family History  Problem Relation Age of Onset  . Aneurysm Mother        brain  . Heart disease Mother 26       heart anyrism  . Healthy Father        fairly  .  Diabetes Sister        half sister  . Bipolar disorder Sister        half sister  . Colon cancer Maternal Grandmother   . Stomach cancer Paternal Grandfather   . Multiple sclerosis Sister     Social History   Socioeconomic History  . Marital status: Married    Spouse name: None  . Number of children: None  . Years of education: None  . Highest education level: None  Social Needs  . Financial resource strain: None  . Food insecurity - worry: None  . Food insecurity - inability: None  . Transportation needs - medical: None  . Transportation needs - non-medical: None  Occupational History  . Occupation: Music therapist  Tobacco Use  . Smoking status: Never Smoker  . Smokeless tobacco: Never Used  Substance and Sexual Activity  . Alcohol use: No  . Drug use: No  . Sexual activity: Yes    Partners: Male  Other Topics Concern    . 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 Cholecalciferol (VITAMIN D) 2000 units CAPS Take 2,000 Units by mouth daily.    . clomiPHENE (CLOMID) 50 MG tablet Take 50 mg by mouth every 30 (thirty) days. During menstrual cycles x 90month    . InFLIXimab (REMICADE IV) IV infusion every 2 months    . metFORMIN (GLUCOPHAGE) 500 MG tablet Take 500 mg by mouth daily with breakfast.    . Prenatal Vit-Fe Fumarate-FA (PRENATAL PO) Take 1 tablet by mouth daily.    .Marland Kitchensulfamethoxazole-trimethoprim (BACTRIM DS,SEPTRA DS) 800-160 MG tablet Take 1 tablet by mouth 2 (two) times daily. For 10 days     No facility-administered medications prior to visit.      EXAM:  BP 126/64 (BP Location: Left Arm, Patient Position: Sitting, Cuff Size: Large)   Pulse 84   Temp 98 F (36.7 C) (Oral)   Ht 5' 2.5" (1.588 m)   Wt 299 lb 14.4 oz (136 kg)   BMI 53.98 kg/m   Body mass index is 53.98 kg/m. Wt Readings from Last 3 Encounters:  07/23/17 299 lb 14.4 oz (136 kg)  03/07/17 291 lb (132 kg)  02/15/17 291 lb (132 kg)    Physical Exam: Vital signs reviewed [id number] a well-developed well-nourished alert cooperative    who appearsr stated age in no acute distress.  HEENT: normocephalic atraumatic , Eyes: PERRL EOM's full, conjunctiva clear, Nares: paten,t no deformity discharge or tenderness., Ears: no deformity EAC's clear TMs with normal landmarks. Mouth: clear OP, no lesions, edema. Tonsils 3+ no swelling or exudate  Moist mucous membranes. Dentition in adequate repair. NECK: supple without masses, thyromegaly or bruits. CHEST/PULM:  Clear to auscultation and percussion breath sounds equal no wheeze , rales or rhonchi. No chest wall  deformities or tenderness. Breast: normal by inspection . No dimpling, discharge, masses, tenderness or discharge . CV: PMI is nondisplaced, S1 S2 no gallops, murmurs, rubs. Peripheral pulses without delay.No JVD .  ABDOMEN: Bowel sounds normal nontender  No guard or rebound, no hepato splenomegal no CVA tenderness.  . Extremtities:  No clubbing cyanosis or edema,  no acute joint swelling or redness no focal atrophy NEURO:  Oriented x3, cranial nerves 3-12 appear to be intact, no obvious focal weakness,gait within normal limits no abnormal reflexes or asymmetrical SKIN: No acute rashes normal turgor, color, no bruising or petechiae. PSYCH: Oriented, good eye contact, no obvious depression anxiety, cognition and judgment appear normal. LN: no cervical axillary l adenopathy    BP Readings from Last 3 Encounters:  07/23/17 126/64  03/07/17 (!) 115/52  02/15/17 110/62    Lab results reviewed with patient   ASSESSMENT AND PLAN:  Discussed the following assessment and plan:  Visit for preventive health examination - Plan: CMP, CBC with Differential/Platelet, Hemoglobin A1c, Lipid panel, TSH, T4, free  Crohn's disease of colon with complication ([place]) - Plan: Amb Ref to Medical Weight Management, CMP, CBC with Differential/Platelet, Hemoglobin A1c, Lipid panel, TSH, T4, free  Obesity, morbid, BMI 50 or higher ([place]) - Plan: Amb Ref to Medical Weight Management, CMP, CBC with Differential/Platelet, Hemoglobin A1c, Lipid panel, TSH, T4, free  Cough, persistent - Plan: CMP, CBC with Differential/Platelet, Hemoglobin A1c, Lipid panel, TSH, T4, free  TONSILLAR HYPERTROPHY - Plan: CMP, CBC with Differential/Platelet, Hemoglobin A1c, Lipid panel, TSH, T4, free  Medication management - Plan: CMP, CBC with Differential/Platelet, Hemoglobin A1c, Lipid panel, TSH, T4, free Cough could be from  Silent reflux  Vs  pnd allergy etc   Exam is unrevealing but    Not alarming  Can fu if  persistent or  progressive  Weight loss may help  In fertility  Patient Care Team: Burnis Medin, MD as PCP - General Milus Banister, [name], [name], MD (Rheumatology) Patient Instructions  Try claritin allegra or zyrtec generic   Could have silent reflux cause the cough also   Weight loss will help and soetimes we use acid suppression .    Will notify you  of labs when available.   Health Maintenance, Female Adopting a healthy lifestyle and getting preventive care can go a long way to promote health and wellness. Talk with your health care provider about what schedule of regular examinations is right for you. This is a good chance for you to check in with your provider about disease prevention and staying healthy. In between checkups, there are plenty of things you can do on your own. Experts have done a lot of research about which lifestyle changes and preventive measures are most likely to keep you healthy. Ask your health care provider for more information. Weight and diet Eat a healthy diet  Be sure to include plenty of vegetables, fruits, low-fat dairy products, and lean protein.  Do not eat a lot of foods high in solid fats, added sugars, or salt.  Get regular exercise. This is one of the most important things you can do for your health. ? Most adults should exercise for at least 150 minutes each week. The exercise should increase your heart rate and make you sweat (moderate-intensity exercise). ? Most adults should also do strengthening exercises at least twice a week. This is in addition to the moderate-intensity exercise.  Maintain a healthy weight  Body mass index (BMI) is a measurement that can be used to identify possible weight problems. It estimates body fat based on height and weight. Your health care provider can help determine your BMI and help you achieve or maintain a healthy weight.  For females 52  years of age and older: ? A BMI  below 18.5 is considered underweight. ? A BMI of 18.5 to 24.9 is normal. ? A BMI of 25 to 29.9 is considered overweight. ? A BMI of 30 and above is considered obese.  Watch levels of cholesterol and blood lipids  You should start having your blood tested for lipids and cholesterol at 34 years of age, then have this test every 5 years.  You may need to have your cholesterol levels checked more often if: ? Your lipid or cholesterol levels are high. ? You are older than 35 years of age. ? You are at high risk for heart disease.  Cancer screening Lung Cancer  Lung cancer screening is recommended for adults 59-47 years old who are at high risk for lung cancer because of a history of smoking.  A yearly low-dose CT scan of the lungs is recommended for people who: ? Currently smoke. ? Have quit within the past 15 years. ? Have at least a 30-pack-year history of smoking. A pack year is smoking an average of one pack of cigarettes a day for 1 year.  Yearly screening should continue until it has been 15 years since you quit.  Yearly screening should stop if you develop a health problem that would prevent you from having lung cancer treatment.  Breast Cancer  Practice breast self-awareness. This means understanding how your breasts normally appear and feel.  It also means doing regular breast self-exams. Let your health care provider know about any changes, no matter how small.  If you are in your 20s or 30s, you should have a clinical breast exam (CBE) by a health care provider every 1-3 years as part of a regular health exam.  If you are 27 or older, have a CBE every year. Also consider having a breast X-ray (mammogram) every year.  If you have a family history of breast cancer, talk to your health care provider about genetic screening.  If you are at high risk for breast cancer, talk to your health care provider about having an MRI and a mammogram every  year.  Breast cancer gene (BRCA) assessment is recommended for women who have family members with BRCA-related cancers. BRCA-related cancers include: ? Breast. ? Ovarian. ? Tubal. ? Peritoneal cancers.  Results of the assessment will determine the need for genetic counseling and BRCA1 and BRCA2 testing.  Cervical Cancer Your health care provider may recommend that you be screened regularly for cancer of the pelvic organs (ovaries, uterus, and vagina). This screening involves a pelvic examination, including checking for microscopic changes to the surface of your cervix (Pap test). You may be encouraged to have this screening done every 3 years, beginning at age 95.  For women ages 61-65, health care providers may recommend pelvic exams and Pap testing every 3 years, or they may recommend the Pap and pelvic exam, combined with testing for human papilloma virus (HPV), every 5 years. Some types of HPV increase your risk of cervical cancer. Testing for HPV may also be done on women of any age with unclear Pap test results.  Other health care providers may not recommend any screening for nonpregnant women who are considered low risk for pelvic cancer and who do not have symptoms. Ask your health care provider if a screening pelvic exam is right for you.  If you have had past treatment for cervical cancer or a condition that could lead to cancer, you need Pap tests and screening for cancer for at least 20 years after your  treatment. If Pap tests have been discontinued, your risk factors (such as having a new sexual partner) need to be reassessed to determine if screening should resume. Some women have medical problems that increase the chance of getting cervical cancer. In these cases, your health care provider may recommend more frequent screening and Pap tests.  Colorectal Cancer  This type of cancer can be detected and often prevented.  Routine colorectal cancer screening usually begins at 35  years of age and continues through 35 years of age.  Your health care provider may recommend screening at an earlier age if you have risk factors for colon cancer.  Your health care provider may also recommend using home test kits to check for hidden blood in the stool.  A small camera at the end of a tube can be used to examine your colon directly (sigmoidoscopy or colonoscopy). This is done to check for the earliest forms of colorectal cancer.  Routine screening usually begins at age 32.  Direct examination of the colon should be repeated every 5-10 years through 35 years of age. However, you may need to be screened more often if early forms of precancerous polyps or small growths are found.  Skin Cancer  Check your skin from head to toe regularly.  Tell your health care provider about any new moles or changes in moles, especially if there is a change in a mole's shape or color.  Also tell your health care provider if you have a mole that is larger than the size of a pencil eraser.  Always use sunscreen. Apply sunscreen liberally and repeatedly throughout the day.  Protect yourself by wearing long sleeves, pants, a wide-brimmed hat, and sunglasses whenever you are outside.  Heart disease, diabetes, and high blood pressure  High blood pressure causes heart disease and increases the risk of stroke. High blood pressure is more likely to develop in: ? People who have blood pressure in the high end of the normal range (130-139/85-89 mm Hg). ? People who are overweight or obese. ? People who are African American.  If you are 33-35 years of age, have your blood pressure checked every 3-5 years. If you are 57 years of age or older, have your blood pressure checked every year. You should have your blood pressure measured twice-once when you are at a hospital or clinic, and once when you are not at a hospital or clinic. Record the average of the two measurements. To check your blood pressure  when you are not at a hospital or clinic, you can use: ? An automated blood pressure machine at a pharmacy. ? A home blood pressure monitor.  If you are between 106 years and 79 years old, ask your health care provider if you should take aspirin to prevent strokes.  Have regular diabetes screenings. This involves taking a blood sample to check your fasting blood sugar level. ? If you are at a normal weight and have a low risk for diabetes, have this test once every three years after 35 years of age. ? If you are overweight and have a high risk for diabetes, consider being tested at a younger age or more often. Preventing infection Hepatitis B  If you have a higher risk for hepatitis B, you should be screened for this virus. You are considered at high risk for hepatitis B if: ? You were born in a country where hepatitis B is common. Ask your health care provider which countries are considered high risk. ?  Your parents were born in a high-risk country, and you have not been immunized against hepatitis B (hepatitis B vaccine). ? You have HIV or AIDS. ? You use needles to inject street drugs. ? You live with someone who has hepatitis B. ? You have had sex with someone who has hepatitis B. ? You get hemodialysis treatment. ? You take certain medicines for conditions, including cancer, organ transplantation, and autoimmune conditions.  Hepatitis C  Blood testing is recommended for: ? Everyone born from [birth year] through 1963. ? Anyone with known risk factors for hepatitis C.  Sexually transmitted infections (STIs)  You should be screened for sexually transmitted infections (STIs) including gonorrhea and chlamydia if: ? You are sexually active and are younger than 35 years of age. ? You are older than 35 years of age and your health care provider tells you that you are at risk for this type of infection. ? Your sexual activity has changed since you were last screened and you are at an increased  risk for chlamydia or gonorrhea. Ask your health care provider if you are at risk.  If you do not have HIV, but are at risk, it may be recommended that you take a prescription medicine daily to prevent HIV infection. This is called pre-exposure prophylaxis (PrEP). You are considered at risk if: ? You are sexually active and do not regularly use condoms or know the HIV status of your partner(s). ? You take drugs by injection. ? You are sexually active with a partner who has HIV.  Talk with your health care provider about whether you are at high risk of being infected with HIV. If you choose to begin PrEP, you should first be tested for HIV. You should then be tested every 3 months for as long as you are taking PrEP. Pregnancy  If you are premenopausal and you may become pregnant, ask your health care provider about preconception counseling.  If you may become pregnant, take 400 to 800 micrograms (mcg) of folic acid every day.  If you want to prevent pregnancy, talk to your health care provider about birth control (contraception). Osteoporosis and menopause  Osteoporosis is a disease in which the bones lose minerals and strength with aging. This can result in serious bone fractures. Your risk for osteoporosis can be identified using a bone density scan.  If you are 68 years of age or older, or if you are at risk for osteoporosis and fractures, ask your health care provider if you should be screened.  Ask your health care provider whether you should take a calcium or vitamin D supplement to lower your risk for osteoporosis.  Menopause may have certain physical symptoms and risks.  Hormone replacement therapy may reduce some of these symptoms and risks. Talk to your health care provider about whether hormone replacement therapy is right for you. Follow these instructions at home:  Schedule regular health, dental, and eye exams.  Stay current with your immunizations.  Do not use any  tobacco products including cigarettes, chewing tobacco, or electronic cigarettes.  If you are pregnant, do not drink alcohol.  If you are breastfeeding, limit how much and how often you drink alcohol.  Limit alcohol intake to no more than 1 drink per day for nonpregnant women. One drink equals 12 ounces of beer, 5 ounces of wine, or 1 ounces of hard liquor.  Do not use street drugs.  Do not share needles.  Ask your health care provider for help if you  need support or information about quitting drugs.  Tell your health care provider if you often feel depressed.  Tell your health care provider if you have ever been abused or do not feel safe at home. This information is not intended to replace advice given to you by your health care provider. Make sure you discuss any questions you have with your health care provider. Document Released: 11/27/2010 Document Revised: 10/20/2015 Document Reviewed: 02/15/2015 Elsevier Interactive Patient Education  2018 [place]. Mialynn Shelvin M.D.

## 2017-07-23 ENCOUNTER — Ambulatory Visit (INDEPENDENT_AMBULATORY_CARE_PROVIDER_SITE_OTHER): Payer: BC Managed Care – PPO | Admitting: Internal Medicine

## 2017-07-23 ENCOUNTER — Encounter: Payer: Self-pay | Admitting: Internal Medicine

## 2017-07-23 VITALS — BP 126/64 | HR 84 | Temp 98.0°F | Ht 62.5 in | Wt 299.9 lb

## 2017-07-23 DIAGNOSIS — K50119 Crohn's disease of large intestine with unspecified complications: Secondary | ICD-10-CM

## 2017-07-23 DIAGNOSIS — R053 Chronic cough: Secondary | ICD-10-CM

## 2017-07-23 DIAGNOSIS — Z0000 Encounter for general adult medical examination without abnormal findings: Secondary | ICD-10-CM

## 2017-07-23 DIAGNOSIS — Z79899 Other long term (current) drug therapy: Secondary | ICD-10-CM

## 2017-07-23 DIAGNOSIS — R05 Cough: Secondary | ICD-10-CM | POA: Diagnosis not present

## 2017-07-23 DIAGNOSIS — J351 Hypertrophy of tonsils: Secondary | ICD-10-CM

## 2017-07-23 NOTE — Patient Instructions (Signed)
Try claritin allegra or zyrtec generic   Could have silent reflux cause the cough also   Weight loss will help and soetimes we use acid suppression .    Will notify you  of labs when available.   Health Maintenance, Female Adopting a healthy lifestyle and getting preventive care can go a long way to promote health and wellness. Talk with your health care provider about what schedule of regular examinations is right for you. This is a good chance for you to check in with your provider about disease prevention and staying healthy. In between checkups, there are plenty of things you can do on your own. Experts have done a lot of research about which lifestyle changes and preventive measures are most likely to keep you healthy. Ask your health care provider for more information. Weight and diet Eat a healthy diet  Be sure to include plenty of vegetables, fruits, low-fat dairy products, and lean protein.  Do not eat a lot of foods high in solid fats, added sugars, or salt.  Get regular exercise. This is one of the most important things you can do for your health. ? Most adults should exercise for at least 150 minutes each week. The exercise should increase your heart rate and make you sweat (moderate-intensity exercise). ? Most adults should also do strengthening exercises at least twice a week. This is in addition to the moderate-intensity exercise.  Maintain a healthy weight  Body mass index (BMI) is a measurement that can be used to identify possible weight problems. It estimates body fat based on height and weight. Your health care provider can help determine your BMI and help you achieve or maintain a healthy weight.  For females 2 years of age and older: ? A BMI below 18.5 is considered underweight. ? A BMI of 18.5 to 24.9 is normal. ? A BMI of 25 to 29.9 is considered overweight. ? A BMI of 30 and above is considered obese.  Watch levels of cholesterol and blood lipids  You should  start having your blood tested for lipids and cholesterol at 35 years of age, then have this test every 5 years.  You may need to have your cholesterol levels checked more often if: ? Your lipid or cholesterol levels are high. ? You are older than 35 years of age. ? You are at high risk for heart disease.  Cancer screening Lung Cancer  Lung cancer screening is recommended for adults 23-73 years old who are at high risk for lung cancer because of a history of smoking.  A yearly low-dose CT scan of the lungs is recommended for people who: ? Currently smoke. ? Have quit within the past 15 years. ? Have at least a 30-pack-year history of smoking. A pack year is smoking an average of one pack of cigarettes a day for 1 year.  Yearly screening should continue until it has been 15 years since you quit.  Yearly screening should stop if you develop a health problem that would prevent you from having lung cancer treatment.  Breast Cancer  Practice breast self-awareness. This means understanding how your breasts normally appear and feel.  It also means doing regular breast self-exams. Let your health care provider know about any changes, no matter how small.  If you are in your 20s or 30s, you should have a clinical breast exam (CBE) by a health care provider every 1-3 years as part of a regular health exam.  If you are 40  or older, have a CBE every year. Also consider having a breast X-ray (mammogram) every year.  If you have a family history of breast cancer, talk to your health care provider about genetic screening.  If you are at high risk for breast cancer, talk to your health care provider about having an MRI and a mammogram every year.  Breast cancer gene (BRCA) assessment is recommended for women who have family members with BRCA-related cancers. BRCA-related cancers include: ? Breast. ? Ovarian. ? Tubal. ? Peritoneal cancers.  Results of the assessment will determine the need  for genetic counseling and BRCA1 and BRCA2 testing.  Cervical Cancer Your health care provider may recommend that you be screened regularly for cancer of the pelvic organs (ovaries, uterus, and vagina). This screening involves a pelvic examination, including checking for microscopic changes to the surface of your cervix (Pap test). You may be encouraged to have this screening done every 3 years, beginning at age 31.  For women ages 64-65, health care providers may recommend pelvic exams and Pap testing every 3 years, or they may recommend the Pap and pelvic exam, combined with testing for human papilloma virus (HPV), every 5 years. Some types of HPV increase your risk of cervical cancer. Testing for HPV may also be done on women of any age with unclear Pap test results.  Other health care providers may not recommend any screening for nonpregnant women who are considered low risk for pelvic cancer and who do not have symptoms. Ask your health care provider if a screening pelvic exam is right for you.  If you have had past treatment for cervical cancer or a condition that could lead to cancer, you need Pap tests and screening for cancer for at least 20 years after your treatment. If Pap tests have been discontinued, your risk factors (such as having a new sexual partner) need to be reassessed to determine if screening should resume. Some women have medical problems that increase the chance of getting cervical cancer. In these cases, your health care provider may recommend more frequent screening and Pap tests.  Colorectal Cancer  This type of cancer can be detected and often prevented.  Routine colorectal cancer screening usually begins at 35 years of age and continues through 35 years of age.  Your health care provider may recommend screening at an earlier age if you have risk factors for colon cancer.  Your health care provider may also recommend using home test kits to check for hidden blood in  the stool.  A small camera at the end of a tube can be used to examine your colon directly (sigmoidoscopy or colonoscopy). This is done to check for the earliest forms of colorectal cancer.  Routine screening usually begins at age 30.  Direct examination of the colon should be repeated every 5-10 years through 35 years of age. However, you may need to be screened more often if early forms of precancerous polyps or small growths are found.  Skin Cancer  Check your skin from head to toe regularly.  Tell your health care provider about any new moles or changes in moles, especially if there is a change in a mole's shape or color.  Also tell your health care provider if you have a mole that is larger than the size of a pencil eraser.  Always use sunscreen. Apply sunscreen liberally and repeatedly throughout the day.  Protect yourself by wearing long sleeves, pants, a wide-brimmed hat, and sunglasses whenever you are  outside.  Heart disease, diabetes, and high blood pressure  High blood pressure causes heart disease and increases the risk of stroke. High blood pressure is more likely to develop in: ? People who have blood pressure in the high end of the normal range (130-139/85-89 mm Hg). ? People who are overweight or obese. ? People who are African American.  If you are 18-39 years of age, have your blood pressure checked every 3-5 years. If you are 40 years of age or older, have your blood pressure checked every year. You should have your blood pressure measured twice-once when you are at a hospital or clinic, and once when you are not at a hospital or clinic. Record the average of the two measurements. To check your blood pressure when you are not at a hospital or clinic, you can use: ? An automated blood pressure machine at a pharmacy. ? A home blood pressure monitor.  If you are between 55 years and 79 years old, ask your health care provider if you should take aspirin to prevent  strokes.  Have regular diabetes screenings. This involves taking a blood sample to check your fasting blood sugar level. ? If you are at a normal weight and have a low risk for diabetes, have this test once every three years after 35 years of age. ? If you are overweight and have a high risk for diabetes, consider being tested at a younger age or more often. Preventing infection Hepatitis B  If you have a higher risk for hepatitis B, you should be screened for this virus. You are considered at high risk for hepatitis B if: ? You were born in a country where hepatitis B is common. Ask your health care provider which countries are considered high risk. ? Your parents were born in a high-risk country, and you have not been immunized against hepatitis B (hepatitis B vaccine). ? You have HIV or AIDS. ? You use needles to inject street drugs. ? You live with someone who has hepatitis B. ? You have had sex with someone who has hepatitis B. ? You get hemodialysis treatment. ? You take certain medicines for conditions, including cancer, organ transplantation, and autoimmune conditions.  Hepatitis C  Blood testing is recommended for: ? Everyone born from 1945 through 1965. ? Anyone with known risk factors for hepatitis C.  Sexually transmitted infections (STIs)  You should be screened for sexually transmitted infections (STIs) including gonorrhea and chlamydia if: ? You are sexually active and are younger than 35 years of age. ? You are older than 35 years of age and your health care provider tells you that you are at risk for this type of infection. ? Your sexual activity has changed since you were last screened and you are at an increased risk for chlamydia or gonorrhea. Ask your health care provider if you are at risk.  If you do not have HIV, but are at risk, it may be recommended that you take a prescription medicine daily to prevent HIV infection. This is called pre-exposure prophylaxis  (PrEP). You are considered at risk if: ? You are sexually active and do not regularly use condoms or know the HIV status of your partner(s). ? You take drugs by injection. ? You are sexually active with a partner who has HIV.  Talk with your health care provider about whether you are at high risk of being infected with HIV. If you choose to begin PrEP, you should first be tested   for HIV. You should then be tested every 3 months for as long as you are taking PrEP. Pregnancy  If you are premenopausal and you may become pregnant, ask your health care provider about preconception counseling.  If you may become pregnant, take 400 to 800 micrograms (mcg) of folic acid every day.  If you want to prevent pregnancy, talk to your health care provider about birth control (contraception). Osteoporosis and menopause  Osteoporosis is a disease in which the bones lose minerals and strength with aging. This can result in serious bone fractures. Your risk for osteoporosis can be identified using a bone density scan.  If you are 47 years of age or older, or if you are at risk for osteoporosis and fractures, ask your health care provider if you should be screened.  Ask your health care provider whether you should take a calcium or vitamin D supplement to lower your risk for osteoporosis.  Menopause may have certain physical symptoms and risks.  Hormone replacement therapy may reduce some of these symptoms and risks. Talk to your health care provider about whether hormone replacement therapy is right for you. Follow these instructions at home:  Schedule regular health, dental, and eye exams.  Stay current with your immunizations.  Do not use any tobacco products including cigarettes, chewing tobacco, or electronic cigarettes.  If you are pregnant, do not drink alcohol.  If you are breastfeeding, limit how much and how often you drink alcohol.  Limit alcohol intake to no more than 1 drink per day for  nonpregnant women. One drink equals 12 ounces of beer, 5 ounces of wine, or 1 ounces of hard liquor.  Do not use street drugs.  Do not share needles.  Ask your health care provider for help if you need support or information about quitting drugs.  Tell your health care provider if you often feel depressed.  Tell your health care provider if you have ever been abused or do not feel safe at home. This information is not intended to replace advice given to you by your health care provider. Make sure you discuss any questions you have with your health care provider. Document Released: 11/27/2010 Document Revised: 10/20/2015 Document Reviewed: 02/15/2015 [employer]vier Interactive Patient Education  [employer].

## 2017-07-24 LAB — LIPID PANEL
CHOL/HDL RATIO: 3
Cholesterol: 146 mg/dL (ref 0–200)
HDL: 47.6 mg/dL (ref >39.00)
LDL Cholesterol: 89 mg/dL (ref 0–99)
NONHDL: 98.48
Triglycerides: 47 mg/dL (ref 0.0–149.0)
VLDL: 9.4 mg/dL (ref 0.0–40.0)

## 2017-07-24 LAB — COMPREHENSIVE METABOLIC PANEL
ALT: 8 U/L (ref 0–35)
AST: 11 U/L (ref 0–37)
Albumin: 3.3 g/dL — ABNORMAL LOW (ref 3.5–5.2)
Alkaline Phosphatase: 87 U/L (ref 39–117)
BUN: 10 mg/dL (ref 6–23)
CO2: 29 meq/L (ref 19–32)
Calcium: 9.3 mg/dL (ref 8.4–10.5)
Chloride: 99 mEq/L (ref 96–112)
Creatinine, Ser: 0.74 mg/dL (ref 0.40–1.20)
GFR: 115.34 mL/min (ref >60.00)
GLUCOSE: 77 mg/dL (ref 70–99)
POTASSIUM: 4 meq/L (ref 3.5–5.1)
SODIUM: 136 meq/L (ref 135–145)
Total Bilirubin: 0.3 mg/dL (ref 0.2–1.2)
Total Protein: 7.1 g/dL (ref 6.0–8.3)

## 2017-07-24 LAB — CBC WITH DIFFERENTIAL/PLATELET
BASOS PCT: 2.6 % (ref 0.0–3.0)
Basophils Absolute: 0.4 10*3/uL — ABNORMAL HIGH (ref 0.0–0.1)
EOS PCT: 0.8 % (ref 0.0–5.0)
Eosinophils Absolute: 0.1 10*3/uL (ref 0.0–0.7)
HCT: 35.8 % — ABNORMAL LOW (ref 36.0–46.0)
Hemoglobin: 11.3 g/dL — ABNORMAL LOW (ref 12.0–15.0)
LYMPHS ABS: 4 10*3/uL (ref 0.7–4.0)
Lymphocytes Relative: 27.5 % (ref 12.0–46.0)
MCHC: 31.6 g/dL (ref 30.0–36.0)
MCV: 81.1 fl (ref 78.0–100.0)
Monocytes Absolute: 1.1 10*3/uL — ABNORMAL HIGH (ref 0.1–1.0)
Monocytes Relative: 7.3 % (ref 3.0–12.0)
NEUTROS ABS: 8.9 10*3/uL — AB (ref 1.4–7.7)
NEUTROS PCT: 61.8 % (ref 43.0–77.0)
PLATELETS: 412 10*3/uL — AB (ref 150.0–400.0)
RBC: 4.42 Mil/uL (ref 3.87–5.11)
RDW: 17.4 % — AB (ref 11.5–15.5)
WBC: 14.4 10*3/uL — ABNORMAL HIGH (ref 4.0–10.5)

## 2017-07-24 LAB — TSH: TSH: 1.1 u[IU]/mL (ref 0.35–4.50)

## 2017-07-24 LAB — T4, FREE: FREE T4: 0.84 ng/dL (ref 0.60–1.60)

## 2017-07-24 LAB — HEMOGLOBIN A1C: Hgb A1c MFr Bld: 6.1 % (ref 4.6–6.5)

## 2017-07-30 NOTE — Progress Notes (Signed)
LM x 1 on 07/29/17

## 2017-08-26 DEATH — deceased

## 2017-08-28 ENCOUNTER — Encounter: Payer: Self-pay | Admitting: Gastroenterology

## 2017-09-19 ENCOUNTER — Encounter (INDEPENDENT_AMBULATORY_CARE_PROVIDER_SITE_OTHER): Payer: Self-pay

## 2017-10-02 ENCOUNTER — Encounter (INDEPENDENT_AMBULATORY_CARE_PROVIDER_SITE_OTHER): Payer: Self-pay | Admitting: Family Medicine

## 2017-10-02 ENCOUNTER — Ambulatory Visit (INDEPENDENT_AMBULATORY_CARE_PROVIDER_SITE_OTHER): Payer: BC Managed Care – PPO | Admitting: Family Medicine

## 2017-10-02 VITALS — BP 104/68 | HR 85 | Temp 97.4°F | Ht 63.0 in | Wt 293.0 lb

## 2017-10-02 DIAGNOSIS — Z6841 Body Mass Index (BMI) 40.0 and over, adult: Secondary | ICD-10-CM | POA: Diagnosis not present

## 2017-10-02 DIAGNOSIS — E559 Vitamin D deficiency, unspecified: Secondary | ICD-10-CM | POA: Diagnosis not present

## 2017-10-02 DIAGNOSIS — Z9189 Other specified personal risk factors, not elsewhere classified: Secondary | ICD-10-CM | POA: Diagnosis not present

## 2017-10-02 DIAGNOSIS — Z1331 Encounter for screening for depression: Secondary | ICD-10-CM

## 2017-10-02 DIAGNOSIS — R5383 Other fatigue: Secondary | ICD-10-CM | POA: Diagnosis not present

## 2017-10-02 DIAGNOSIS — R739 Hyperglycemia, unspecified: Secondary | ICD-10-CM | POA: Diagnosis not present

## 2017-10-02 DIAGNOSIS — R0602 Shortness of breath: Secondary | ICD-10-CM | POA: Diagnosis not present

## 2017-10-02 DIAGNOSIS — K5010 Crohn's disease of large intestine without complications: Secondary | ICD-10-CM | POA: Diagnosis not present

## 2017-10-02 DIAGNOSIS — Z0289 Encounter for other administrative examinations: Secondary | ICD-10-CM

## 2017-10-03 LAB — COMPREHENSIVE METABOLIC PANEL
ALK PHOS: 105 IU/L (ref 39–117)
ALT: 11 IU/L (ref 0–32)
AST: 13 IU/L (ref 0–40)
Albumin/Globulin Ratio: 1 — ABNORMAL LOW (ref 1.2–2.2)
Albumin: 3.5 g/dL (ref 3.5–5.5)
BUN/Creatinine Ratio: 12 (ref 9–23)
BUN: 8 mg/dL (ref 6–20)
Bilirubin Total: 0.2 mg/dL (ref 0.0–1.2)
CHLORIDE: 99 mmol/L (ref 96–106)
CO2: 24 mmol/L (ref 20–29)
CREATININE: 0.65 mg/dL (ref 0.57–1.00)
Calcium: 8.9 mg/dL (ref 8.7–10.2)
GFR calc Af Amer: 134 mL/min/{1.73_m2} (ref >59)
GFR calc non Af Amer: 116 mL/min/{1.73_m2} (ref >59)
GLUCOSE: 80 mg/dL (ref 65–99)
Globulin, Total: 3.6 g/dL (ref 1.5–4.5)
Potassium: 4.2 mmol/L (ref 3.5–5.2)
Sodium: 138 mmol/L (ref 134–144)
Total Protein: 7.1 g/dL (ref 6.0–8.5)

## 2017-10-03 LAB — CBC WITH DIFFERENTIAL
BASOS ABS: 0 10*3/uL (ref 0.0–0.2)
Basos: 0 %
EOS (ABSOLUTE): 0.2 10*3/uL (ref 0.0–0.4)
Eos: 1 %
Hematocrit: 36.9 % (ref 34.0–46.6)
Hemoglobin: 11.2 g/dL (ref 11.1–15.9)
IMMATURE GRANULOCYTES: 1 %
Immature Grans (Abs): 0.1 10*3/uL (ref 0.0–0.1)
LYMPHS: 30 %
Lymphocytes Absolute: 3.9 10*3/uL — ABNORMAL HIGH (ref 0.7–3.1)
MCH: 24.7 pg — AB (ref 26.6–33.0)
MCHC: 30.4 g/dL — ABNORMAL LOW (ref 31.5–35.7)
MCV: 82 fL (ref 79–97)
MONOCYTES: 7 %
Monocytes Absolute: 1 10*3/uL — ABNORMAL HIGH (ref 0.1–0.9)
NEUTROS ABS: 8 10*3/uL — AB (ref 1.4–7.0)
NEUTROS PCT: 61 %
RBC: 4.53 x10E6/uL (ref 3.77–5.28)
RDW: 17.4 % — ABNORMAL HIGH (ref 12.3–15.4)
WBC: 13.1 10*3/uL — ABNORMAL HIGH (ref 3.4–10.8)

## 2017-10-03 LAB — VITAMIN B12: VITAMIN B 12: 428 pg/mL (ref 232–1245)

## 2017-10-03 LAB — LIPID PANEL WITH LDL/HDL RATIO
CHOLESTEROL TOTAL: 149 mg/dL (ref 100–199)
HDL: 51 mg/dL (ref >39)
LDL CALC: 83 mg/dL (ref 0–99)
LDL/HDL RATIO: 1.6 ratio (ref 0.0–3.2)
Triglycerides: 75 mg/dL (ref 0–149)
VLDL CHOLESTEROL CAL: 15 mg/dL (ref 5–40)

## 2017-10-03 LAB — VITAMIN D 25 HYDROXY (VIT D DEFICIENCY, FRACTURES): VIT D 25 HYDROXY: 24.9 ng/mL — AB (ref 30.0–100.0)

## 2017-10-03 LAB — HEMOGLOBIN A1C
ESTIMATED AVERAGE GLUCOSE: 126 mg/dL
HEMOGLOBIN A1C: 6 % — AB (ref 4.8–5.6)

## 2017-10-03 LAB — FOLATE: FOLATE: 6.2 ng/mL (ref >3.0)

## 2017-10-03 LAB — T4, FREE: Free T4: 1.08 ng/dL (ref 0.82–1.77)

## 2017-10-03 LAB — T3: T3, Total: 118 ng/dL (ref 71–180)

## 2017-10-03 LAB — TSH: TSH: 1.13 u[IU]/mL (ref 0.450–4.500)

## 2017-10-03 LAB — INSULIN, RANDOM: INSULIN: 9.8 u[IU]/mL (ref 2.6–24.9)

## 2017-10-08 NOTE — Progress Notes (Signed)
.  Office: [phone]  /  Fax: [phone]   HPI:   Chief Complaint: OBESITY  [name] (MR# [id number]) is a 37 y.o. female who presents on 10/02/2017 for obesity evaluation and treatment. Current BMI is Body mass index is 51.9 kg/m.[name] [name] has struggled with obesity for years and has been unsuccessful in either losing weight or maintaining long term weight loss. [name] attended our information session and states she is currently in the action stage of change and ready to dedicate time achieving and maintaining a healthier weight.   [name] states her family eats meals together her desired weight loss is 83 lbs she started gaining weight after she had a depo shot for fibroid her heaviest weight ever was 299 lbs she is a picky eater and doesn't like to eat healthier foods  she has significant food cravings issues  she snacks frequently in the evenings she is frequently drinking liquids with calories she frequently makes poor food choices she frequently eats larger portions than normal  she struggles with emotional eating    Fatigue [name] feels her energy is lower than it should be. This has worsened with weight gain and has not worsened recently. [name] admits to daytime somnolence and  admits to waking up still tired. Patient is at risk for obstructive sleep apnea. Patent has a history of symptoms of daytime fatigue. Patient generally gets 6 hours of sleep per night, and states they generally have generally restful sleep. Snoring is present. Apneic episodes are not present. Epworth Sleepiness Score is 12.  Dyspnea on exertion Sarabi notes increasing shortness of breath with exercising and seems to be worsening over time with weight gain. She notes getting out of breath sooner with activity than she used to. This has not gotten worse recently. Adalee denies orthopnea.  Hyperglycemia [name] has a history of some elevated blood glucose readings without a diagnosis of diabetes. She  is on metformin for infertility, no diagnosis of polycystic ovarian syndrome. She admits to polyphagia.  At risk for diabetes [name] is at higher than average risk for developing diabetes due to her obesity and hyperglycemia. She currently denies polyuria or polydipsia.  Vitamin D Deficiency [name] has a diagnosis of vitamin D deficiency. She is on Vit D prescription 50,000 weekly. She notes fatigue and denies nausea, vomiting or muscle weakness.  Crohn's Disease [name] is status post ileocecal resection, she stable on Remicade. Trying to avoid very spicy food and ETOH.  Depression Screen [name]'s Food and Mood (modified PHQ-9) score was  Depression screen PHQ 2/9 10/02/2017  Decreased Interest 1  Down, Depressed, Hopeless 0  PHQ - 2 Score 1  Altered sleeping 2  Tired, decreased energy 3  Change in appetite 1  Feeling bad or failure about yourself  0  Trouble concentrating 0  Moving slowly or fidgety/restless 0  Suicidal thoughts 0  PHQ-9 Score 7  Difficult doing work/chores Not difficult at all    ALLERGIES: No Known Allergies  MEDICATIONS: Current Outpatient Medications on File Prior to Visit  Medication Sig Dispense Refill  . Cholecalciferol (VITAMIN D) 2000 units CAPS Take 2,000 Units by mouth daily.    . clomiPHENE (CLOMID) 50 MG tablet Take 50 mg by mouth every 30 (thirty) days. During menstrual cycles x 9month    . fexofenadine (ALLEGRA) 180 MG tablet Take 180 mg by mouth daily.    . InFLIXimab (REMICADE IV) IV infusion every 2 months    . metFORMIN (GLUCOPHAGE) 500 MG tablet Take 500 mg  by mouth daily with breakfast.    . Prenatal Vit-Fe Fumarate-FA (PRENATAL PO) Take 1 tablet by mouth daily.     No current facility-administered medications on file prior to visit.     PAST MEDICAL HISTORY: Past Medical History:  Diagnosis Date  . Abrasion of upper arm with infection, left, sequela    started antibiotics lef arm oct 4th started antibiotics healing well  . Anemia     hx of 2 yrs ago  . Chronic rhinitis   . Contact dermatitis and other eczema, due to unspecified cause   . Crohn's 11-15-11   no issues in 2 yrs  . Fibroids 11-15-11   remains with fibroids  . Hernia   . Incisional hernia, RLQ 10/02/2011  . Infertility, female   . Mononucleosis   . Obesity   . Pilonidal abscess   . Rectal abscess   . 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 Vitamin D deficiency     PAST SURGICAL HISTORY: Past Surgical History:  Procedure Laterality Date  . APPENDECTOMY  7/06  . COLONOSCOPY WITH PROPOFOL N/A 03/07/2017   Procedure: COLONOSCOPY WITH PROPOFOL;  Surgeon: Milus Banister, MD;  Location: [facility];  Service: Endoscopy;  Laterality: N/A;  . EYE SURGERY  11-15-11   2003-multiple stys  . ileocecal resection  1/11   crohns disease with fisula/stricture. Dr. Ronnald Collum  . KNEE ARTHROSCOPY  99, 02   [place].  [name] MYOMECTOMY N/A 11/10/2014   Procedure: MYOMECTOMY;  Surgeon: Everett Graff, MD;  Location: [facility];  Service: Gynecology;  Laterality: N/A;  . PILONIDAL CYST EXCISION  2011, 2012   I&Ds  . VENTRAL HERNIA REPAIR  11/27/2011   Procedure: LAPAROSCOPIC VENTRAL HERNIA;  Surgeon: Adin Hector, MD;  Location: [facility];  Service: General;  Laterality: N/A;  Laparoscopic Exploration & Repair of Hernia in Abdomen  . WISDOM TOOTH EXTRACTION      SOCIAL HISTORY: Social History   Tobacco Use  . Smoking status: Never Smoker  . Smokeless tobacco: Never Used  Substance Use Topics  . Alcohol use: No  . Drug use: No    FAMILY HISTORY: Family History  Problem Relation Age of Onset  . Aneurysm Mother        brain  . Heart disease Mother 27       heart anyrism  . Healthy Father        fairly  . Diabetes Sister        half sister  . Bipolar disorder Sister        half sister  . Colon cancer Maternal Grandmother   . Stomach cancer Paternal Grandfather   . Multiple sclerosis Sister     ROS: Review of Systems  Constitutional: Positive for  malaise/fatigue. Negative for weight loss.  HENT: Positive for ear discharge.        + Nasal stuffiness + Swollen glands  Respiratory: Positive for shortness of breath.   Cardiovascular: Negative for orthopnea.       + Very cold feet or hands  Gastrointestinal: Negative for nausea and vomiting.  Genitourinary: Negative for frequency.  Musculoskeletal:       Negative muscle weakness  Endo/Heme/Allergies: Negative for polydipsia.       Positive polyphagia    PHYSICAL EXAM: Blood pressure 104/68, pulse 85, temperature (!) 97.4 F (36.3 C), temperature source Oral, height 5' 3"  (1.6 m), weight 293 lb (132.9 kg), last menstrual period 09/09/2017, SpO2 98 %. Body mass index is  51.9 kg/m. Physical Exam  Constitutional: She is oriented to person, place, and time. She appears well-developed and well-nourished.  HENT:  Head: Normocephalic and atraumatic.  Nose: Nose normal.  Eyes: EOM are normal. No scleral icterus.  Neck: Normal range of motion. Neck supple. No thyromegaly present.  Cardiovascular: Normal rate and regular rhythm.  Pulmonary/Chest: Effort normal. No respiratory distress.  Abdominal: Soft. There is no tenderness.  + Obesity  Musculoskeletal:  Range of Motion normal in all 4 extremities Trace edema noted in bilateral lower extremities  Neurological: She is alert and oriented to person, place, and time. Coordination normal.  Skin: Skin is warm and dry.  Psychiatric: She has a normal mood and affect. Her behavior is normal.  Vitals reviewed.   RECENT LABS AND TESTS: BMET    Component Value Date/Time   NA 138 10/02/2017 1245   K 4.2 10/02/2017 1245   CL 99 10/02/2017 1245   CO2 24 10/02/2017 1245   GLUCOSE 80 10/02/2017 1245   GLUCOSE 77 07/23/2017 1606   BUN 8 10/02/2017 1245   CREATININE 0.65 10/02/2017 1245   CALCIUM 8.9 10/02/2017 1245   GFRNONAA 116 10/02/2017 1245   GFRAA 134 10/02/2017 1245   Lab Results  Component Value Date   HGBA1C 6.0 (H)  10/02/2017   Lab Results  Component Value Date   INSULIN 9.8 10/02/2017   CBC    Component Value Date/Time   WBC 13.1 (H) 10/02/2017 1245   WBC 14.4 (H) 07/23/2017 1606   RBC 4.53 10/02/2017 1245   RBC 4.42 07/23/2017 1606   HGB 11.2 10/02/2017 1245   HCT 36.9 10/02/2017 1245   PLT 412.0 (H) 07/23/2017 1606   MCV 82 10/02/2017 1245   MCH 24.7 (L) 10/02/2017 1245   MCH 29.2 11/12/2014 0536   MCHC 30.4 (L) 10/02/2017 1245   MCHC 31.6 07/23/2017 1606   RDW 17.4 (H) 10/02/2017 1245   LYMPHSABS 3.9 (H) 10/02/2017 1245   MONOABS 1.1 (H) 07/23/2017 1606   EOSABS 0.2 10/02/2017 1245   BASOSABS 0.0 10/02/2017 1245   Iron/TIBC/Ferritin/ %Sat    Component Value Date/Time   IRON 114 07/29/2013 0825   FERRITIN 14.8 07/29/2013 0825   IRONPCTSAT 36.8 07/29/2013 0825   Lipid Panel     Component Value Date/Time   CHOL 149 10/02/2017 1245   TRIG 75 10/02/2017 1245   HDL 51 10/02/2017 1245   CHOLHDL 3 07/23/2017 1606   VLDL 9.4 07/23/2017 1606   LDLCALC 83 10/02/2017 1245   Hepatic Function Panel     Component Value Date/Time   PROT 7.1 10/02/2017 1245   ALBUMIN 3.5 10/02/2017 1245   AST 13 10/02/2017 1245   ALT 11 10/02/2017 1245   ALKPHOS 105 10/02/2017 1245   BILITOT 0.2 10/02/2017 1245   BILIDIR 0.1 05/29/2016 0938      Component Value Date/Time   TSH 1.130 10/02/2017 1245   Vitamin D No recent labs  ECG  shows NSR with a rate of 87 BPM INDIRECT CALORIMETER done today shows a VO2 of 272 and a REE of 1896. Her calculated basal metabolic rate is 7494 thus her basal metabolic rate is worse than expected.    ASSESSMENT AND PLAN: Other fatigue - Plan: EKG 12-Lead, Vitamin B12, CBC With Differential, Folate, T3, T4, free, TSH, Lipid Panel With LDL/HDL Ratio  Shortness of breath on exertion - Plan: CBC With Differential  Hyperglycemia - Plan: Comprehensive metabolic panel, Hemoglobin A1c, Insulin, random  Vitamin D deficiency - Plan:  VITAMIN D 25 Hydroxy (Vit-D  Deficiency, Fractures)  Crohn's disease of colon without complication (HCC)  Depression screening  At risk for diabetes mellitus  Class 3 severe obesity with serious comorbidity and body mass index (BMI) of 50.0 to 59.9 in adult, unspecified obesity type ([place])  PLAN:  Fatigue [name] was informed that her fatigue may be related to obesity, depression or many other causes. Labs will be ordered, and in the meanwhile [name] has agreed to work on diet, exercise and weight loss to help with fatigue. Proper sleep hygiene was discussed including the need for 7-8 hours of quality sleep each night. A sleep study was not ordered based on symptoms and Epworth score.  Dyspnea on exertion [name]'s shortness of breath appears to be obesity related and exercise induced. She has agreed to work on weight loss and gradually increase exercise to treat her exercise induced shortness of breath. If [name] follows our instructions and loses weight without improvement of her shortness of breath, we will plan to refer to pulmonology. We will monitor this condition regularly. [name] agrees to this plan.  Hyperglycemia Fasting labs will be obtained and results with be discussed with [name] in 2 weeks at her follow up visit. In the meanwhile [name] was started on a lower simple carbohydrate diet and will work on weight loss efforts.  Diabetes risk counselling [name] was given extended (15 minutes) diabetes prevention counseling today. She is 37 y.o. female and has risk factors for diabetes including obesity and hyperglycemia. We discussed intensive lifestyle modifications today with an emphasis on weight loss as well as increasing exercise and decreasing simple carbohydrates in her diet.  Vitamin D Deficiency [name] was informed that low vitamin D levels contributes to fatigue and are associated with obesity, breast, and colon cancer. [name] agrees to continue taking prescription Vit D @50 ,000 IU every week and will  follow up for routine testing of vitamin D, at least 2-3 times per year. She was informed of the risk of over-replacement of vitamin D and agrees to not increase her dose unless she discusses this with [name] first. [name] agrees to follow up with our clinic in 2 weeks.  Crohn's Disease [name] is to start diet prescription and will follow closely as we may need to adjust eating plan due to GI issues. [name] agrees to follow up with our clinic in 2 weeks.  Depression Screen [name] had a mildly positive depression screening. Depression is commonly associated with obesity and often results in emotional eating behaviors. We will monitor this closely and work on CBT to help improve the non-hunger eating patterns. Referral to Psychology may be required if no improvement is seen as she continues in our clinic.  Obesity [name] is currently in the action stage of change and her goal is to continue with weight loss efforts She has agreed to follow the Category 3 plan [name] has been instructed to work up to a goal of 150 minutes of combined cardio and strengthening exercise per week for weight loss and overall health benefits. We discussed the following Behavioral Modification Strategies today: increasing lean protein intake, decreasing simple carbohydrates , decrease eating out and work on meal planning and easy cooking plans  [name] has agreed to follow up with our clinic in 2 weeks. She was informed of the importance of frequent follow up visits to maximize her success with intensive lifestyle modifications for her multiple health conditions. She was informed we would discuss her lab results at her next visit unless  there is a critical issue that needs to be addressed sooner. [name] agreed to keep her next visit at the agreed upon time to discuss these results.    OBESITY BEHAVIORAL INTERVENTION VISIT  Today's visit was # 1 out of 22.  Starting weight: 293 lbs Starting date: 10/02/17 Today's weight : 293  lbs  Today's date: 10/02/2017 Total lbs lost to date: 0 (Patients must lose 7 lbs in the first 6 months to continue with counseling)   ASK: We discussed the diagnosis of obesity with Standley Dakins today and Alecia agreed to give [name] permission to discuss obesity behavioral modification therapy today.  ASSESS: Circe has the diagnosis of obesity and her BMI today is 51.92 [place] is in the action stage of change   ADVISE: Mirakle was educated on the multiple health risks of obesity as well as the benefit of weight loss to improve her health. She was advised of the need for long term treatment and the importance of lifestyle modifications.  AGREE: Multiple dietary modification options and treatment options were discussed and  Carletha agreed to the above obesity treatment plan.   I, Trixie Dredge, am acting as transcriptionist for Dennard Nip, MD  I have reviewed the above documentation for accuracy and completeness, and I agree with the above. -Dennard Nip, MD

## 2017-10-16 ENCOUNTER — Ambulatory Visit (INDEPENDENT_AMBULATORY_CARE_PROVIDER_SITE_OTHER): Payer: BC Managed Care – PPO | Admitting: Family Medicine

## 2017-10-16 VITALS — BP 110/70 | HR 83 | Temp 97.6°F | Ht 63.0 in | Wt 287.0 lb

## 2017-10-16 DIAGNOSIS — Z9189 Other specified personal risk factors, not elsewhere classified: Secondary | ICD-10-CM

## 2017-10-16 DIAGNOSIS — Z6841 Body Mass Index (BMI) 40.0 and over, adult: Secondary | ICD-10-CM | POA: Diagnosis not present

## 2017-10-16 DIAGNOSIS — R7303 Prediabetes: Secondary | ICD-10-CM | POA: Diagnosis not present

## 2017-10-16 DIAGNOSIS — E559 Vitamin D deficiency, unspecified: Secondary | ICD-10-CM

## 2017-10-17 MED ORDER — VITAMIN D (ERGOCALCIFEROL) 1.25 MG (50000 UNIT) PO CAPS: 50000.0000 [IU] | ORAL_CAPSULE | ORAL | 0 refills | Status: DC

## 2017-10-17 NOTE — Progress Notes (Signed)
Office: [phone]  /  Fax: [phone]   HPI:   Chief Complaint: OBESITY [name] is here to discuss her progress with her obesity treatment plan. She is on the Category 3 plan and is following her eating plan approximately 100 % of the time. She states she is exercising 0 minutes 0 times per week. [name] did very well with weight loss on Category 3 plan. Hunger was controlled and she struggled to eat all her protein. She liked her food choices and did well with meal prepping.  Her weight is 287 lb (130.2 kg) today and has had a weight loss of 6 pounds over a period of 2 weeks since her last visit. She has lost 6 lbs since starting treatment with [name].  Vitamin D Deficiency [name] has a diagnosis of vitamin D deficiency. She is on OTC Vit D, not yet at goal. She denies nausea, vomiting or muscle weakness.  Pre-Diabetes [name] has a diagnosis of pre-diabetes based on her elevated Hgb A1c and was informed this puts her at greater risk of developing diabetes. A1c elevated at 6.0, polyphagia improved on diet prescription and she denies hypoglycemia. She is taking metformin currently and continues to work on diet and exercise to decrease risk of diabetes.   At risk for diabetes [name] is at higher than average risk for developing diabetes due to her obesity and pre-diabetes. She currently denies polyuria or polydipsia.  ALLERGIES: No Known Allergies  MEDICATIONS: Current Outpatient Medications on File Prior to Visit  Medication Sig Dispense Refill  . Cholecalciferol (VITAMIN D) 2000 units CAPS Take 2,000 Units by mouth daily.    . clomiPHENE (CLOMID) 50 MG tablet Take 50 mg by mouth every 30 (thirty) days. During menstrual cycles x 76month    . fexofenadine (ALLEGRA) 180 MG tablet Take 180 mg by mouth daily.    . InFLIXimab (REMICADE IV) IV infusion every 2 months    . metFORMIN (GLUCOPHAGE) 500 MG tablet Take 500 mg by mouth daily with breakfast.    . Prenatal Vit-Fe Fumarate-FA  (PRENATAL PO) Take 1 tablet by mouth daily.     No current facility-administered medications on file prior to visit.     PAST MEDICAL HISTORY: Past Medical History:  Diagnosis Date  . Abrasion of upper arm with infection, left, sequela    started antibiotics lef arm oct 4th started antibiotics healing well  . Anemia    hx of 2 yrs ago  . Chronic rhinitis   . Contact dermatitis and other eczema, due to unspecified cause   . Crohn's 11-15-11   no issues in 2 yrs  . Fibroids 11-15-11   remains with fibroids  . Hernia   . Incisional hernia, RLQ 10/02/2011  . Infertility, female   . Mononucleosis   . Obesity   . Pilonidal abscess   . Rectal abscess   . 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   . Vitamin D deficiency     PAST SURGICAL HISTORY: Past Surgical History:  Procedure Laterality Date  . APPENDECTOMY  7/06  . COLONOSCOPY WITH PROPOFOL N/A 03/07/2017   Procedure: COLONOSCOPY WITH PROPOFOL;  Surgeon: JMilus Banister MD;  Location: [facility];  Service: Endoscopy;  Laterality: N/A;  . EYE SURGERY  11-15-11   2003-multiple stys  . ileocecal resection  1/11   crohns disease with fisula/stricture. Dr. ARonnald Collum . KNEE ARTHROSCOPY  99, 02   Bil.  .Marland KitchenMYOMECTOMY N/A 11/10/2014   Procedure: MYOMECTOMY;  Surgeon: AEverett Graff  MD;  Location: [facility];  Service: Gynecology;  Laterality: N/A;  . PILONIDAL CYST EXCISION  2011, 2012   I&Ds  . VENTRAL HERNIA REPAIR  11/27/2011   Procedure: LAPAROSCOPIC VENTRAL HERNIA;  Surgeon: Adin Hector, MD;  Location: [facility];  Service: General;  Laterality: N/A;  Laparoscopic Exploration & Repair of Hernia in Abdomen  . WISDOM TOOTH EXTRACTION      SOCIAL HISTORY: Social History   Tobacco Use  . Smoking status: Never Smoker  . Smokeless tobacco: Never Used  Substance Use Topics  . Alcohol use: No  . Drug use: No    FAMILY HISTORY: Family History  Problem Relation Age of Onset  . Aneurysm Mother        brain  . Heart  disease Mother 29       heart anyrism  . Healthy Father        fairly  . Diabetes Sister        half sister  . Bipolar disorder Sister        half sister  . Colon cancer Maternal Grandmother   . Stomach cancer Paternal Grandfather   . Multiple sclerosis Sister     ROS: Review of Systems  Constitutional: Positive for weight loss.  Gastrointestinal: Negative for nausea and vomiting.  Genitourinary: Negative for frequency.  Musculoskeletal:       Negative muscle weakness  Endo/Heme/Allergies: Negative for polydipsia.       Positive polyphagia Negative hypoglycemia    PHYSICAL EXAM: Blood pressure 110/70, pulse 83, temperature 97.6 F (36.4 C), temperature source Oral, height 5' 3"  (1.6 m), weight 287 lb (130.2 kg), SpO2 97 %. Body mass index is 50.84 kg/m. Physical Exam  Constitutional: She is oriented to person, place, and time. She appears well-developed and well-nourished.  Cardiovascular: Normal rate.  Pulmonary/Chest: Effort normal.  Musculoskeletal: Normal range of motion.  Neurological: She is oriented to person, place, and time.  Skin: Skin is warm and dry.  Psychiatric: She has a normal mood and affect. Her behavior is normal.  Vitals reviewed.   RECENT LABS AND TESTS: BMET    Component Value Date/Time   NA 138 10/02/2017 1245   K 4.2 10/02/2017 1245   CL 99 10/02/2017 1245   CO2 24 10/02/2017 1245   GLUCOSE 80 10/02/2017 1245   GLUCOSE 77 07/23/2017 1606   BUN 8 10/02/2017 1245   CREATININE 0.65 10/02/2017 1245   CALCIUM 8.9 10/02/2017 1245   GFRNONAA 116 10/02/2017 1245   GFRAA 134 10/02/2017 1245   Lab Results  Component Value Date   HGBA1C 6.0 (H) 10/02/2017   HGBA1C 6.1 07/23/2017   Lab Results  Component Value Date   INSULIN 9.8 10/02/2017   CBC    Component Value Date/Time   WBC 13.1 (H) 10/02/2017 1245   WBC 14.4 (H) 07/23/2017 1606   RBC 4.53 10/02/2017 1245   RBC 4.42 07/23/2017 1606   HGB 11.2 10/02/2017 1245   HCT 36.9  10/02/2017 1245   PLT 412.0 (H) 07/23/2017 1606   MCV 82 10/02/2017 1245   MCH 24.7 (L) 10/02/2017 1245   MCH 29.2 11/12/2014 0536   MCHC 30.4 (L) 10/02/2017 1245   MCHC 31.6 07/23/2017 1606   RDW 17.4 (H) 10/02/2017 1245   LYMPHSABS 3.9 (H) 10/02/2017 1245   MONOABS 1.1 (H) 07/23/2017 1606   EOSABS 0.2 10/02/2017 1245   BASOSABS 0.0 10/02/2017 1245   Iron/TIBC/Ferritin/ %Sat    Component Value Date/Time   IRON 114  07/29/2013 0825   FERRITIN 14.8 07/29/2013 0825   IRONPCTSAT 36.8 07/29/2013 0825   Lipid Panel     Component Value Date/Time   CHOL 149 10/02/2017 1245   TRIG 75 10/02/2017 1245   HDL 51 10/02/2017 1245   CHOLHDL 3 07/23/2017 1606   VLDL 9.4 07/23/2017 1606   LDLCALC 83 10/02/2017 1245   Hepatic Function Panel     Component Value Date/Time   PROT 7.1 10/02/2017 1245   ALBUMIN 3.5 10/02/2017 1245   AST 13 10/02/2017 1245   ALT 11 10/02/2017 1245   ALKPHOS 105 10/02/2017 1245   BILITOT 0.2 10/02/2017 1245   BILIDIR 0.1 05/29/2016 0938      Component Value Date/Time   TSH 1.130 10/02/2017 1245   TSH 1.10 07/23/2017 1606   TSH 1.86 05/29/2016 0938  Results for YENI, JIGGETTS (MRN [id number]) as of 10/17/2017 11:00  Ref. Range 10/02/2017 12:45  Vitamin D, 25-Hydroxy Latest Ref Range: 30.0 - 100.0 ng/mL 24.9 (L)    ASSESSMENT AND PLAN: Vitamin D deficiency  Prediabetes  At risk for diabetes mellitus  Class 3 severe obesity with serious comorbidity and body mass index (BMI) of 50.0 to 59.9 in adult, unspecified obesity type ([place])  PLAN:  Vitamin D Deficiency [name] was informed that low vitamin D levels contributes to fatigue and are associated with obesity, breast, and colon cancer. [name] agrees to start prescription Vit D @50 ,000 IU every week #4 with no refills. She will follow up for routine testing of vitamin D, at least 2-3 times per year. She was informed of the risk of over-replacement of vitamin D and agrees to not increase her dose unless she  discusses this with [name] first. [name] agrees to follow up with our clinic in 2 to 3 weeks.  Pre-Diabetes [name] will continue to work on weight loss, diet, exercise, and decreasing simple carbohydrates in her diet to help decrease the risk of diabetes. We dicussed metformin including benefits and risks. She was informed that eating too many simple carbohydrates or too many calories at one sitting increases the likelihood of GI side effects. Mayar agrees to continue taking metformin and she agrees to follow up with our clinic in 2 to 3 weeks as directed to monitor her progress.  Diabetes risk counselling [name] was given extended (30 minutes) diabetes prevention counseling today. She is 37 y.o. female and has risk factors for diabetes including obesity and pre-diabetes. We discussed intensive lifestyle modifications today with an emphasis on weight loss as well as increasing exercise and decreasing simple carbohydrates in her diet.  Obesity [name] is currently in the action stage of change. As such, her goal is to continue with weight loss efforts She has agreed to follow the Category 3 plan [name] has been instructed to work up to a goal of 150 minutes of combined cardio and strengthening exercise per week for weight loss and overall health benefits. We discussed the following Behavioral Modification Strategies today: increasing lean protein intake, decreasing simple carbohydrates  and holiday eating strategies    Hadlee has agreed to follow up with our clinic in 2 to 3 weeks. She was informed of the importance of frequent follow up visits to maximize her success with intensive lifestyle modifications for her multiple health conditions.   OBESITY BEHAVIORAL INTERVENTION VISIT  Today's visit was # 2 out of 22.  Starting weight: 293 lbs Starting date: 10/02/17 Today's weight : 287 lbs Today's date: 10/16/2017 Total lbs lost to date: 6 (Patients must  lose 7 lbs in the first 6 months to continue  with counseling)   ASK: We discussed the diagnosis of obesity with Standley Dakins today and Dimitra agreed to give [name] permission to discuss obesity behavioral modification therapy today.  ASSESS: Marchel has the diagnosis of obesity and her BMI today is 50.85 Inger is in the action stage of change   ADVISE: Delita was educated on the multiple health risks of obesity as well as the benefit of weight loss to improve her health. She was advised of the need for long term treatment and the importance of lifestyle modifications.  AGREE: Multiple dietary modification options and treatment options were discussed and  Adela agreed to the above obesity treatment plan.  I, Trixie Dredge, am acting as transcriptionist for Dennard Nip, MD  I have reviewed the above documentation for accuracy and completeness, and I agree with the above. -Dennard Nip, MD

## 2017-10-27 ENCOUNTER — Other Ambulatory Visit (HOSPITAL_COMMUNITY)
Admission: RE | Admit: 2017-10-27 | Discharge: 2017-10-27 | Disposition: A | Discharge status: Home / Self Care | Payer: BC Managed Care – PPO | Source: Ambulatory Visit | Attending: Obstetrics and Gynecology | Admitting: Obstetrics and Gynecology

## 2017-10-27 DIAGNOSIS — N979 Female infertility, unspecified: Secondary | ICD-10-CM | POA: Insufficient documentation

## 2017-10-28 LAB — PROGESTERONE: PROGESTERONE: 11.2 ng/mL

## 2017-11-07 ENCOUNTER — Ambulatory Visit (INDEPENDENT_AMBULATORY_CARE_PROVIDER_SITE_OTHER): Payer: BC Managed Care – PPO | Admitting: Family Medicine

## 2017-11-07 VITALS — BP 112/69 | HR 76 | Temp 98.0°F | Ht 63.0 in | Wt 279.0 lb

## 2017-11-07 DIAGNOSIS — E559 Vitamin D deficiency, unspecified: Secondary | ICD-10-CM | POA: Diagnosis not present

## 2017-11-07 DIAGNOSIS — Z9189 Other specified personal risk factors, not elsewhere classified: Secondary | ICD-10-CM | POA: Diagnosis not present

## 2017-11-07 DIAGNOSIS — Z6841 Body Mass Index (BMI) 40.0 and over, adult: Secondary | ICD-10-CM | POA: Diagnosis not present

## 2017-11-07 DIAGNOSIS — E66813 Obesity, class 3: Secondary | ICD-10-CM

## 2017-11-07 MED ORDER — VITAMIN D (ERGOCALCIFEROL) 1.25 MG (50000 UNIT) PO CAPS
50000.0000 [IU] | ORAL_CAPSULE | ORAL | 0 refills | Status: DC
Start: 2017-11-07 — End: 2017-11-25

## 2017-11-07 NOTE — Progress Notes (Signed)
Office: [phone]  /  Fax: [phone]   HPI:   Chief Complaint: OBESITY [name] is here to discuss her progress with her obesity treatment plan. She is on the Category 3 plan and is following her eating plan approximately 98 % of the time. She states she is exercising 0 minutes 0 times per week. [name] continues to do well with Category 3 plan. Hunger is controlled. She is traveling soon and has questions about eating out.  Her weight is 279 lb (126.6 kg) today and has had a weight loss of 8 pounds over a period of 3 weeks since her last visit. She has lost 14 lbs since starting treatment with [name].  Vitamin D Deficiency [name] has a diagnosis of vitamin D deficiency. She is stable on prescription Vit D and denies nausea, vomiting or muscle weakness.  At risk for osteopenia and osteoporosis [name] is at higher risk of osteopenia and osteoporosis due to vitamin D deficiency.   ALLERGIES: No Known Allergies  MEDICATIONS: Current Outpatient Medications on File Prior to Visit  Medication Sig Dispense Refill  . clomiPHENE (CLOMID) 50 MG tablet Take 50 mg by mouth every 30 (thirty) days. During menstrual cycles x 25month    . fexofenadine (ALLEGRA) 180 MG tablet Take 180 mg by mouth daily.    . InFLIXimab (REMICADE IV) IV infusion every 2 months    . metFORMIN (GLUCOPHAGE) 500 MG tablet Take 500 mg by mouth daily with breakfast.    . Prenatal Vit-Fe Fumarate-FA (PRENATAL PO) Take 1 tablet by mouth daily.    . Vitamin D, Ergocalciferol, (DRISDOL) 50000 units CAPS capsule Take 1 capsule (50,000 Units total) by mouth every 7 (seven) days. 4 capsule 0   No current facility-administered medications on file prior to visit.     PAST MEDICAL HISTORY: Past Medical History:  Diagnosis Date  . Abrasion of upper arm with infection, left, sequela    started antibiotics lef arm oct 4th started antibiotics healing well  . Anemia    hx of 2 yrs ago  . Chronic rhinitis   . Contact dermatitis  and other eczema, due to unspecified cause   . Crohn's 11-15-11   no issues in 2 yrs  . Fibroids 11-15-11   remains with fibroids  . Hernia   . Incisional hernia, RLQ 10/02/2011  . Infertility, female   . Mononucleosis   . Obesity   . Pilonidal abscess   . Rectal abscess   . 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   . Vitamin D deficiency     PAST SURGICAL HISTORY: Past Surgical History:  Procedure Laterality Date  . APPENDECTOMY  7/06  . COLONOSCOPY WITH PROPOFOL N/A 03/07/2017   Procedure: COLONOSCOPY WITH PROPOFOL;  Surgeon: JMilus Banister MD;  Location: [facility];  Service: Endoscopy;  Laterality: N/A;  . EYE SURGERY  11-15-11   2003-multiple stys  . ileocecal resection  1/11   crohns disease with fisula/stricture. Dr. ARonnald Collum . KNEE ARTHROSCOPY  99, 02   Bil.  .Marland KitchenMYOMECTOMY N/A 11/10/2014   Procedure: MYOMECTOMY;  Surgeon: AEverett Graff MD;  [facility];  Service: Gynecology;  Laterality: N/A;  . PILONIDAL CYST EXCISION  2011, 2012   I&Ds  . VENTRAL HERNIA REPAIR  11/27/2011   Procedure: LAPAROSCOPIC VENTRAL HERNIA;  Surgeon: SAdin Hector MD;  Location: [facility];  Service: General;  Laterality: N/A;  Laparoscopic Exploration & Repair of Hernia in Abdomen  . WISDOM TOOTH EXTRACTION  SOCIAL HISTORY: Social History   Tobacco Use  . Smoking status: Never Smoker  . Smokeless tobacco: Never Used  Substance Use Topics  . Alcohol use: No  . Drug use: No    FAMILY HISTORY: Family History  Problem Relation Age of Onset  . Aneurysm Mother        brain  . Heart disease Mother 59       heart anyrism  . Healthy Father        fairly  . Diabetes Sister        half sister  . Bipolar disorder Sister        half sister  . Colon cancer Maternal Grandmother   . Stomach cancer Paternal Grandfather   . Multiple sclerosis Sister     ROS: Review of Systems  Constitutional: Positive for weight loss.  Gastrointestinal: Negative for nausea and  vomiting.  Musculoskeletal:       Negative muscle weakness    PHYSICAL EXAM: Blood pressure 112/69, pulse 76, temperature 98 F (36.7 C), temperature source Oral, height 5' 3"  (1.6 m), weight 279 lb (126.6 kg), last menstrual period 11/06/2017, SpO2 97 %. Body mass index is 49.42 kg/m. Physical Exam  Constitutional: She is oriented to person, place, and time. She appears well-developed and well-nourished.  Cardiovascular: Normal rate.  Pulmonary/Chest: Effort normal.  Musculoskeletal: Normal range of motion.  Neurological: She is oriented to person, place, and time.  Skin: Skin is warm and dry.  Psychiatric: She has a normal mood and affect. Her behavior is normal.  Vitals reviewed.   RECENT LABS AND TESTS: BMET    Component Value Date/Time   NA 138 10/02/2017 1245   K 4.2 10/02/2017 1245   CL 99 10/02/2017 1245   CO2 24 10/02/2017 1245   GLUCOSE 80 10/02/2017 1245   GLUCOSE 77 07/23/2017 1606   BUN 8 10/02/2017 1245   CREATININE 0.65 10/02/2017 1245   CALCIUM 8.9 10/02/2017 1245   GFRNONAA 116 10/02/2017 1245   GFRAA 134 10/02/2017 1245   Lab Results  Component Value Date   HGBA1C 6.0 (H) 10/02/2017   HGBA1C 6.1 07/23/2017   Lab Results  Component Value Date   INSULIN 9.8 10/02/2017   CBC    Component Value Date/Time   WBC 13.1 (H) 10/02/2017 1245   WBC 14.4 (H) 07/23/2017 1606   RBC 4.53 10/02/2017 1245   RBC 4.42 07/23/2017 1606   HGB 11.2 10/02/2017 1245   HCT 36.9 10/02/2017 1245   PLT 412.0 (H) 07/23/2017 1606   MCV 82 10/02/2017 1245   MCH 24.7 (L) 10/02/2017 1245   MCH 29.2 11/12/2014 0536   MCHC 30.4 (L) 10/02/2017 1245   MCHC 31.6 07/23/2017 1606   RDW 17.4 (H) 10/02/2017 1245   LYMPHSABS 3.9 (H) 10/02/2017 1245   MONOABS 1.1 (H) 07/23/2017 1606   EOSABS 0.2 10/02/2017 1245   BASOSABS 0.0 10/02/2017 1245   Iron/TIBC/Ferritin/ %Sat    Component Value Date/Time   IRON 114 07/29/2013 0825   FERRITIN 14.8 07/29/2013 0825   IRONPCTSAT 36.8  07/29/2013 0825   Lipid Panel     Component Value Date/Time   CHOL 149 10/02/2017 1245   TRIG 75 10/02/2017 1245   HDL 51 10/02/2017 1245   CHOLHDL 3 07/23/2017 1606   VLDL 9.4 07/23/2017 1606   LDLCALC 83 10/02/2017 1245   Hepatic Function Panel     Component Value Date/Time   PROT 7.1 10/02/2017 1245   ALBUMIN 3.5 10/02/2017 1245  AST 13 10/02/2017 1245   ALT 11 10/02/2017 1245   ALKPHOS 105 10/02/2017 1245   BILITOT 0.2 10/02/2017 1245   BILIDIR 0.1 05/29/2016 0938      Component Value Date/Time   TSH 1.130 10/02/2017 1245   TSH 1.10 07/23/2017 1606   TSH 1.86 05/29/2016 0938  Results for [name], [name] (MRN [id number]) as of 11/07/2017 11:57  Ref. Range 10/02/2017 12:45  Vitamin D, 25-Hydroxy Latest Ref Range: 30.0 - 100.0 ng/mL 24.9 (L)    ASSESSMENT AND PLAN: Vitamin D deficiency - Plan: Vitamin D, Ergocalciferol, (DRISDOL) 50000 units CAPS capsule  At risk for osteoporosis  Class 3 severe obesity with serious comorbidity and body mass index (BMI) of 45.0 to 49.9 in adult, unspecified obesity type ([place])  PLAN:  Vitamin D Deficiency [name] was informed that low vitamin D levels contributes to fatigue and are associated with obesity, breast, and colon cancer. [name] agrees to continue taking prescription Vit D @50 ,000 IU every week #4 and we will refill for 1 month. She will follow up for routine testing of vitamin D, at least 2-3 times per year. She was informed of the risk of over-replacement of vitamin D and agrees to not increase her dose unless she discusses this with [name] first. [name] agrees to follow up with our clinic in 2 to 3 weeks.  At risk for osteopenia and osteoporosis [name] is at risk for osteopenia and osteoporsis due to her vitamin D deficiency. She was encouraged to take her vitamin D and follow her higher calcium diet and increase strengthening exercise to help strengthen her bones and decrease her risk of osteopenia and  osteoporosis.  Obesity [name] is currently in the action stage of change. As such, her goal is to continue with weight loss efforts She has agreed to follow the Category 3 plan [name] has been instructed to work up to a goal of 150 minutes of combined cardio and strengthening exercise per week for weight loss and overall health benefits. We discussed the following Behavioral Modification Strategies today: increasing lean protein intake, work on meal planning and easy cooking plans, travel eating strategies, and celebration eating strategies   [name] has agreed to follow up with our clinic in 2 to 3 weeks. She was informed of the importance of frequent follow up visits to maximize her success with intensive lifestyle modifications for her multiple health conditions.   OBESITY BEHAVIORAL INTERVENTION VISIT  Today's visit was # 3 out of 22.  Starting weight: 293 lbs Starting date: 10/02/17 Today's weight : 279 lbs Today's date: 11/07/2017 Total lbs lost to date: 14 (Patients must lose 7 lbs in the first 6 months to continue with counseling)   ASK: We discussed the diagnosis of obesity with [name] today and [name] agreed to give [name] permission to discuss obesity behavioral modification therapy today.  ASSESS: [name] has the diagnosis of obesity and her BMI today is 49.44 [name] is in the action stage of change   ADVISE: [name] was educated on the multiple health risks of obesity as well as the benefit of weight loss to improve her health. She was advised of the need for long term treatment and the importance of lifestyle modifications.  AGREE: Multiple dietary modification options and treatment options were discussed and  [place] agreed to the above obesity treatment plan.  I, [name], am acting as transcriptionist for Donna Nip, MD  I have reviewed the above documentation for accuracy and completeness, and I agree with the above. -  Donna Nip, MD

## 2017-11-21 ENCOUNTER — Encounter: Payer: Self-pay | Admitting: *Deleted

## 2017-11-25 ENCOUNTER — Ambulatory Visit (INDEPENDENT_AMBULATORY_CARE_PROVIDER_SITE_OTHER): Payer: BC Managed Care – PPO | Admitting: Family Medicine

## 2017-11-25 VITALS — BP 101/64 | HR 84 | Temp 98.2°F | Ht 63.0 in | Wt 278.0 lb

## 2017-11-25 DIAGNOSIS — Z9189 Other specified personal risk factors, not elsewhere classified: Secondary | ICD-10-CM | POA: Diagnosis not present

## 2017-11-25 DIAGNOSIS — E66813 Obesity, class 3: Secondary | ICD-10-CM

## 2017-11-25 DIAGNOSIS — E559 Vitamin D deficiency, unspecified: Secondary | ICD-10-CM | POA: Diagnosis not present

## 2017-11-25 DIAGNOSIS — R7303 Prediabetes: Secondary | ICD-10-CM

## 2017-11-25 DIAGNOSIS — Z6841 Body Mass Index (BMI) 40.0 and over, adult: Secondary | ICD-10-CM

## 2017-11-25 MED ORDER — VITAMIN D (ERGOCALCIFEROL) 1.25 MG (50000 UNIT) PO CAPS: 50000.0000 [IU] | ORAL_CAPSULE | ORAL | 0 refills | Status: DC

## 2017-11-25 NOTE — Progress Notes (Signed)
Office: [phone]  /  Fax: [phone]   HPI:   Chief Complaint: OBESITY [name] is here to discuss her progress with her obesity treatment plan. She is on the Category 3 plan and is following her eating plan approximately 95 % of the time. She states she is exercising 0 minutes 0 times per week. [name] did well with weight loss on her anniversary trip last week. She reports mindful eating and she is making sure to get protein in and she planned snacks for her trip. Her weight is 278 lb (126.1 kg) today and has had a weight loss of 1 pound over a period of 2 weeks since her last visit. She has lost 15 lbs since starting treatment with [name].  Vitamin D deficiency Clarke has a diagnosis of vitamin D deficiency. Her previous level was not at goal. She is currently taking prescription vit D and denies nausea, vomiting or muscle weakness.  Pre-Diabetes [name] has a diagnosis of prediabetes based on her elevated Hgb A1c and was informed this puts her at greater risk of developing diabetes. She is taking metformin currently and continues to work on diet and exercise to decrease risk of diabetes. She denies nausea, diarrhea or hypoglycemia.  At risk for diabetes [name] is at higher than average risk for developing diabetes due to her obesity and pre-diabetes. She currently denies polyuria or polydipsia.  ALLERGIES: No Known Allergies  MEDICATIONS: Current Outpatient Medications on File Prior to Visit  Medication Sig Dispense Refill  . clomiPHENE (CLOMID) 50 MG tablet Take 50 mg by mouth every 30 (thirty) days. During menstrual cycles x 65month    . fexofenadine (ALLEGRA) 180 MG tablet Take 180 mg by mouth daily.    . InFLIXimab (REMICADE IV) IV infusion every 2 months    . metFORMIN (GLUCOPHAGE) 500 MG tablet Take 500 mg by mouth daily with breakfast.    . Prenatal Vit-Fe Fumarate-FA (PRENATAL PO) Take 1 tablet by mouth daily.     No current facility-administered medications on file prior  to visit.     PAST MEDICAL HISTORY: Past Medical History:  Diagnosis Date  . Abrasion of upper arm with infection, left, sequela    started antibiotics lef arm oct 4th started antibiotics healing well  . Anemia    hx of 2 yrs ago  . Chicken pox   . Chronic rhinitis   . Contact dermatitis and other eczema, due to unspecified cause   . Crohn's 11-15-11   no issues in 2 yrs  . Fibroids 11-15-11   remains with fibroids  . Hernia   . Incisional hernia, RLQ 10/02/2011  . Infertility, female   . Mononucleosis   . Obesity   . Pilonidal abscess   . Rectal abscess   . 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[place]   . Vitamin D deficiency     PAST SURGICAL HISTORY: Past Surgical History:  Procedure Laterality Date  . APPENDECTOMY  7/06  . COLONOSCOPY WITH PROPOFOL N/A 03/07/2017   Procedure: COLONOSCOPY WITH PROPOFOL;  Surgeon: JMilus Banister MD;  Location: [facility];  Service: Endoscopy;  Laterality: N/A;  . EYE SURGERY  11-15-11   2003-multiple stys  . ileocecal resection  1/11   crohns disease with fisula/stricture. Dr. ARonnald Collum . KNEE ARTHROSCOPY  99, 02   Bil.  .Marland KitchenMYOMECTOMY N/A 11/10/2014   Procedure: MYOMECTOMY;  Surgeon: AEverett Graff MD;  [facility];  Service: Gynecology;  Laterality: N/A;  . PILONIDAL CYST EXCISION  2011, 2012   I&Ds  . VENTRAL HERNIA REPAIR  11/27/2011   Procedure: LAPAROSCOPIC VENTRAL HERNIA;  Surgeon: Adin Hector, MD;  Location: [facility];  Service: General;  Laterality: N/A;  Laparoscopic Exploration & Repair of Hernia in Abdomen  . WISDOM TOOTH EXTRACTION      SOCIAL HISTORY: Social History   Tobacco Use  . Smoking status: Never Smoker  . Smokeless tobacco: Never Used  Substance Use Topics  . Alcohol use: No  . Drug use: No    FAMILY HISTORY: Family History  Problem Relation Age of Onset  . Aneurysm Mother        brain  . Heart disease Mother 37       heart anyrism  . Healthy Father        fairly  . Diabetes Sister         half sister  . Bipolar disorder Sister        half sister  . Colon cancer Maternal Grandmother   . Stomach cancer Paternal Grandfather   . Multiple sclerosis Sister     ROS: Review of Systems  Constitutional: Positive for weight loss.  Gastrointestinal: Negative for diarrhea, nausea and vomiting.  Genitourinary: Negative for frequency.  Musculoskeletal:       Negative for muscle weakness  Endo/Heme/Allergies: Negative for polydipsia.       Negative for hypoglycemia    PHYSICAL EXAM: Blood pressure 101/64, pulse 84, temperature 98.2 F (36.8 C), temperature source Oral, height 5' 3"  (1.6 m), weight 278 lb (126.1 kg), last menstrual period 11/06/2017, SpO2 96 %. Body mass index is 49.25 kg/m. Physical Exam  Constitutional: She is oriented to person, place, and time. She appears well-developed and well-nourished.  Cardiovascular: Normal rate.  Pulmonary/Chest: Effort normal.  Musculoskeletal: Normal range of motion.  Neurological: She is oriented to person, place, and time.  Skin: Skin is warm and dry.  Psychiatric: She has a normal mood and affect. Her behavior is normal.  Vitals reviewed.   RECENT LABS AND TESTS: BMET    Component Value Date/Time   NA 138 10/02/2017 1245   K 4.2 10/02/2017 1245   CL 99 10/02/2017 1245   CO2 24 10/02/2017 1245   GLUCOSE 80 10/02/2017 1245   GLUCOSE 77 07/23/2017 1606   BUN 8 10/02/2017 1245   CREATININE 0.65 10/02/2017 1245   CALCIUM 8.9 10/02/2017 1245   GFRNONAA 116 10/02/2017 1245   GFRAA 134 10/02/2017 1245   Lab Results  Component Value Date   HGBA1C 6.0 (H) 10/02/2017   HGBA1C 6.1 07/23/2017   Lab Results  Component Value Date   INSULIN 9.8 10/02/2017   CBC    Component Value Date/Time   WBC 13.1 (H) 10/02/2017 1245   WBC 14.4 (H) 07/23/2017 1606   RBC 4.53 10/02/2017 1245   RBC 4.42 07/23/2017 1606   HGB 11.2 10/02/2017 1245   HCT 36.9 10/02/2017 1245   PLT 412.0 (H) 07/23/2017 1606   MCV 82 10/02/2017  1245   MCH 24.7 (L) 10/02/2017 1245   MCH 29.2 11/12/2014 0536   MCHC 30.4 (L) 10/02/2017 1245   MCHC 31.6 07/23/2017 1606   RDW 17.4 (H) 10/02/2017 1245   LYMPHSABS 3.9 (H) 10/02/2017 1245   MONOABS 1.1 (H) 07/23/2017 1606   EOSABS 0.2 10/02/2017 1245   BASOSABS 0.0 10/02/2017 1245   Iron/TIBC/Ferritin/ %Sat    Component Value Date/Time   IRON 114 07/29/2013 0825   FERRITIN 14.8 07/29/2013 0825   IRONPCTSAT 36.8  07/29/2013 0825   Lipid Panel     Component Value Date/Time   CHOL 149 10/02/2017 1245   TRIG 75 10/02/2017 1245   HDL 51 10/02/2017 1245   CHOLHDL 3 07/23/2017 1606   VLDL 9.4 07/23/2017 1606   LDLCALC 83 10/02/2017 1245   Hepatic Function Panel     Component Value Date/Time   PROT 7.1 10/02/2017 1245   ALBUMIN 3.5 10/02/2017 1245   AST 13 10/02/2017 1245   ALT 11 10/02/2017 1245   ALKPHOS 105 10/02/2017 1245   BILITOT 0.2 10/02/2017 1245   BILIDIR 0.1 05/29/2016 0938      Component Value Date/Time   TSH 1.130 10/02/2017 1245   TSH 1.10 07/23/2017 1606   TSH 1.86 05/29/2016 0938   Results for DANYLLE, OUK (MRN [id number]) as of 11/25/2017 16:03  Ref. Range 10/02/2017 12:45  Vitamin D, 25-Hydroxy Latest Ref Range: 30.0 - 100.0 ng/mL 24.9 (L)   ASSESSMENT AND PLAN: Vitamin D deficiency - Plan: Vitamin D, Ergocalciferol, (DRISDOL) 50000 units CAPS capsule  Prediabetes  At risk for diabetes mellitus  Class 3 severe obesity with serious comorbidity and body mass index (BMI) of 45.0 to 49.9 in adult, unspecified obesity type ([place])  PLAN:  Vitamin D Deficiency Metha was informed that low vitamin D levels contributes to fatigue and are associated with obesity, breast, and colon cancer. She agrees to continue to take prescription Vit D @50 ,000 IU every week #4 with no refills and will follow up for routine testing of vitamin D, at least 2-3 times per year. She was informed of the risk of over-replacement of vitamin D and agrees to not increase her dose  unless she discusses this with [name] first. Min agrees to follow up as directed.  Pre-Diabetes Lynesha will continue to work on weight loss, exercise, and decreasing simple carbohydrates in her diet to help decrease the risk of diabetes. We dicussed metformin including benefits and risks. She was informed that eating too many simple carbohydrates or too many calories at one sitting increases the likelihood of GI side effects. Meghanne agrees to continue  metformin for now and a prescription was not written today. We will recheck A1c in 2 months and Alicia agreed to follow up with [name] as directed to monitor her progress.  Diabetes risk counseling Kyomi was given extended (15 minutes) diabetes prevention counseling today. She is 37 y.o. female and has risk factors for diabetes including obesity and pre-diabetes. We discussed intensive lifestyle modifications today with an emphasis on weight loss as well as increasing exercise and decreasing simple carbohydrates in her diet.    Obesity Karina is currently in the action stage of change. As such, her goal is to continue with weight loss efforts She has agreed to follow the Category 3 plan Lulla has been instructed to work up to a goal of 150 minutes of combined cardio and strengthening exercise per week for weight loss and overall health benefits. We discussed the following Behavioral Modification Strategies today: planning for success, work on meal planning and easy cooking plans  Narcissa has agreed to follow up with our clinic in 3 weeks. She was informed of the importance of frequent follow up visits to maximize her success with intensive lifestyle modifications for her multiple health conditions.   OBESITY BEHAVIORAL INTERVENTION VISIT  Today's visit was # 4 out of 22.  Starting weight: 293 lbs Starting date: 10/02/17 Today's weight : 278 lbs  Today's date: 11/25/2017 Total lbs lost to date: 15 (Patients  must lose 7 lbs in the first 6 months to  continue with counseling)   ASK: We discussed the diagnosis of obesity with Standley Dakins today and Alvia agreed to give [name] permission to discuss obesity behavioral modification therapy today.  ASSESS: Stefhanie has the diagnosis of obesity and her BMI today is 49.26 Ceri is in the action stage of change   ADVISE: Amberle was educated on the multiple health risks of obesity as well as the benefit of weight loss to improve her health. She was advised of the need for long term treatment and the importance of lifestyle modifications.  AGREE: Multiple dietary modification options and treatment options were discussed and  Marium agreed to the above obesity treatment plan.  I, Doreene Nest, am acting as transcriptionist for Dennard Nip, MD  I have reviewed the above documentation for accuracy and completeness, and I agree with the above. -Dennard Nip, MD

## 2017-12-16 ENCOUNTER — Ambulatory Visit (INDEPENDENT_AMBULATORY_CARE_PROVIDER_SITE_OTHER): Payer: BC Managed Care – PPO | Admitting: Family Medicine

## 2017-12-16 VITALS — BP 102/67 | HR 86 | Temp 97.7°F | Ht 63.0 in | Wt 276.0 lb

## 2017-12-16 DIAGNOSIS — Z6841 Body Mass Index (BMI) 40.0 and over, adult: Secondary | ICD-10-CM

## 2017-12-16 DIAGNOSIS — E559 Vitamin D deficiency, unspecified: Secondary | ICD-10-CM | POA: Diagnosis not present

## 2017-12-16 NOTE — Progress Notes (Signed)
Office: [phone]  /  Fax: [phone]   HPI:   Chief Complaint: OBESITY [name] is here to discuss her progress with her obesity treatment plan. She is on the Category 3 plan and is following her eating plan approximately 99 % of the time. She states she is playing the Wii for 60 minutes 1 time per week. [name] did well with weight loss. She had no problem eating all the food on her plan. Her weight is 276 lb (125.2 kg) today and has had a weight loss of 2 pounds over a period of 3 weeks since her last visit. She has lost 17 lbs since starting treatment with [name].  Vitamin D deficiency [name] has a diagnosis of vitamin D deficiency. She is currently taking prescription vit D and denies nausea, vomiting or muscle weakness.  ALLERGIES: No Known Allergies  MEDICATIONS: Current Outpatient Medications on File Prior to Visit  Medication Sig Dispense Refill  . clomiPHENE (CLOMID) 50 MG tablet Take 50 mg by mouth every 30 (thirty) days. During menstrual cycles x 42month    . fexofenadine (ALLEGRA) 180 MG tablet Take 180 mg by mouth daily.    . InFLIXimab (REMICADE IV) IV infusion every 2 months    . metFORMIN (GLUCOPHAGE) 500 MG tablet Take 500 mg by mouth daily with breakfast.    . Prenatal Vit-Fe Fumarate-FA (PRENATAL PO) Take 1 tablet by mouth daily.    . Vitamin D, Ergocalciferol, (DRISDOL) 50000 units CAPS capsule Take 1 capsule (50,000 Units total) by mouth every 7 (seven) days. 4 capsule 0   No current facility-administered medications on file prior to visit.     PAST MEDICAL HISTORY: Past Medical History:  Diagnosis Date  . Abrasion of upper arm with infection, left, sequela    started antibiotics lef arm oct 4th started antibiotics healing well  . Anemia    hx of 2 yrs ago  . Chicken pox   . Chronic rhinitis   . Contact dermatitis and other eczema, due to unspecified cause   . Crohn's 11-15-11   no issues in 2 yrs  . Fibroids 11-15-11   remains with fibroids  . Hernia    . Incisional hernia, RLQ 10/02/2011  . Infertility, female   . Mononucleosis   . Obesity   . Pilonidal abscess   . Rectal abscess   . 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   . Vitamin D deficiency     PAST SURGICAL HISTORY: Past Surgical History:  Procedure Laterality Date  . APPENDECTOMY  7/06  . COLONOSCOPY WITH PROPOFOL N/A 03/07/2017   Procedure: COLONOSCOPY WITH PROPOFOL;  Surgeon: JMilus Banister MD;  Location: [facility];  Service: Endoscopy;  Laterality: N/A;  . EYE SURGERY  11-15-11   2003-multiple stys  . ileocecal resection  1/11   crohns disease with fisula/stricture. Dr. ARonnald Collum . KNEE ARTHROSCOPY  99, 02   Bil.  .Marland KitchenMYOMECTOMY N/A 11/10/2014   Procedure: MYOMECTOMY;  Surgeon: AEverett Graff MD;  [facility];  Service: Gynecology;  Laterality: N/A;  . PILONIDAL CYST EXCISION  2011, 2012   I&Ds  . VENTRAL HERNIA REPAIR  11/27/2011   Procedure: LAPAROSCOPIC VENTRAL HERNIA;  Surgeon: SAdin Hector MD;  [facility];  Service: General;  Laterality: N/A;  Laparoscopic Exploration & Repair of Hernia in Abdomen  . WISDOM TOOTH EXTRACTION      SOCIAL HISTORY: Social History   Tobacco Use  . Smoking status: Never Smoker  . Smokeless tobacco:  Never Used  Substance Use Topics  . Alcohol use: No  . Drug use: No    FAMILY HISTORY: Family History  Problem Relation Age of Onset  . Aneurysm Mother        brain  . Heart disease Mother 40       heart anyrism  . Healthy Father        fairly  . Diabetes Sister        half sister  . Bipolar disorder Sister        half sister  . Colon cancer Maternal Grandmother   . Stomach cancer Paternal Grandfather   . Multiple sclerosis Sister     ROS: Review of Systems  Constitutional: Positive for weight loss.  Gastrointestinal: Negative for nausea and vomiting.  Musculoskeletal:       Negative for muscle weakness    PHYSICAL EXAM: Blood pressure 102/67, pulse 86, temperature 97.7 F (36.5 C),  temperature source Oral, height 5' 3"  (1.6 m), weight 276 lb (125.2 kg), last menstrual period 12/03/2017, SpO2 96 %. Body mass index is 48.89 kg/m. Physical Exam  Constitutional: She is oriented to person, place, and time. She appears well-developed and well-nourished.  Cardiovascular: Normal rate.  Pulmonary/Chest: Effort normal.  Musculoskeletal: Normal range of motion.  Neurological: She is oriented to person, place, and time.  Skin: Skin is warm and dry.  Psychiatric: She has a normal mood and affect. Her behavior is normal.  Vitals reviewed.   RECENT LABS AND TESTS: BMET    Component Value Date/Time   NA 138 10/02/2017 1245   K 4.2 10/02/2017 1245   CL 99 10/02/2017 1245   CO2 24 10/02/2017 1245   GLUCOSE 80 10/02/2017 1245   GLUCOSE 77 07/23/2017 1606   BUN 8 10/02/2017 1245   CREATININE 0.65 10/02/2017 1245   CALCIUM 8.9 10/02/2017 1245   GFRNONAA 116 10/02/2017 1245   GFRAA 134 10/02/2017 1245   Lab Results  Component Value Date   HGBA1C 6.0 (H) 10/02/2017   HGBA1C 6.1 07/23/2017   Lab Results  Component Value Date   INSULIN 9.8 10/02/2017   CBC    Component Value Date/Time   WBC 13.1 (H) 10/02/2017 1245   WBC 14.4 (H) 07/23/2017 1606   RBC 4.53 10/02/2017 1245   RBC 4.42 07/23/2017 1606   HGB 11.2 10/02/2017 1245   HCT 36.9 10/02/2017 1245   PLT 412.0 (H) 07/23/2017 1606   MCV 82 10/02/2017 1245   MCH 24.7 (L) 10/02/2017 1245   MCH 29.2 11/12/2014 0536   MCHC 30.4 (L) 10/02/2017 1245   MCHC 31.6 07/23/2017 1606   RDW 17.4 (H) 10/02/2017 1245   LYMPHSABS 3.9 (H) 10/02/2017 1245   MONOABS 1.1 (H) 07/23/2017 1606   EOSABS 0.2 10/02/2017 1245   BASOSABS 0.0 10/02/2017 1245   Iron/TIBC/Ferritin/ %Sat    Component Value Date/Time   IRON 114 07/29/2013 0825   FERRITIN 14.8 07/29/2013 0825   IRONPCTSAT 36.8 07/29/2013 0825   Lipid Panel     Component Value Date/Time   CHOL 149 10/02/2017 1245   TRIG 75 10/02/2017 1245   HDL 51 10/02/2017 1245     CHOLHDL 3 07/23/2017 1606   VLDL 9.4 07/23/2017 1606   LDLCALC 83 10/02/2017 1245   Hepatic Function Panel     Component Value Date/Time   PROT 7.1 10/02/2017 1245   ALBUMIN 3.5 10/02/2017 1245   AST 13 10/02/2017 1245   ALT 11 10/02/2017 1245   ALKPHOS 105 10/02/2017  1245   BILITOT 0.2 10/02/2017 1245   BILIDIR 0.1 05/29/2016 0938      Component Value Date/Time   TSH 1.130 10/02/2017 1245   TSH 1.10 07/23/2017 1606   TSH 1.86 05/29/2016 0938   Results for MANUEL, LAWHEAD (MRN [id number]) as of 12/16/2017 14:30  Ref. Range 10/02/2017 12:45  Vitamin D, 25-Hydroxy Latest Ref Range: 30.0 - 100.0 ng/mL 24.9 (L)   ASSESSMENT AND PLAN: Vitamin D deficiency  Class 3 severe obesity with serious comorbidity and body mass index (BMI) of 45.0 to 49.9 in adult, unspecified obesity type ([place])  PLAN:  Vitamin D Deficiency [name] was informed that low vitamin D levels contributes to fatigue and are associated with obesity, breast, and colon cancer. She agrees to continue to take prescription Vit D @50 ,000 IU every week and we will check vitamin D levels next month. [name] will follow up for routine testing of vitamin D, at least 2-3 times per year. She was informed of the risk of over-replacement of vitamin D and agrees to not increase her dose unless she discusses this with [name] first.  We spent > than 50% of the 15 minute visit on the counseling as documented in the note.  Obesity [name] is currently in the action stage of change. As such, her goal is to continue with weight loss efforts She has agreed to follow the Category 3 plan [name] has been instructed to work up to a goal of 150 minutes of combined cardio and strengthening exercise per week for weight loss and overall health benefits. We discussed the following Behavioral Modification Strategies today: increase H2O intake and work on meal planning and easy cooking plans  [name] has agreed to follow up with our clinic in 3 weeks. She  was informed of the importance of frequent follow up visits to maximize her success with intensive lifestyle modifications for her multiple health conditions.   OBESITY BEHAVIORAL INTERVENTION VISIT  Today's visit was # 5 out of 22.  Starting weight: 293 lbs Starting date: 10/02/17 Today's weight : 276 lbs Today's date: 12/16/2017 Total lbs lost to date: 17    ASK: We discussed the diagnosis of obesity with [name] today and [name] agreed to give [name] permission to discuss obesity behavioral modification therapy today.  ASSESS: [name] has the diagnosis of obesity and her BMI today is 48.9 [name] is in the action stage of change   ADVISE: [name] was educated on the multiple health risks of obesity as well as the benefit of weight loss to improve her health. She was advised of the need for long term treatment and the importance of lifestyle modifications.  AGREE: Multiple dietary modification options and treatment options were discussed and  [name] agreed to the above obesity treatment plan.  I, Doreene Nest, am acting as transcriptionist for Dennard Nip, MD  I have reviewed the above documentation for accuracy and completeness, and I agree with the above. -Dennard Nip, MD

## 2017-12-24 ENCOUNTER — Telehealth: Payer: Self-pay | Admitting: Gastroenterology

## 2017-12-24 NOTE — Telephone Encounter (Signed)
   Labs dated December 09, 2017 were reviewed.  White blood cell count 17,000, hemoglobin 12.7, normal platelets.  Complete med about profile was normal.   Can you let her know that I reviewed her Labs her white blood cell count was little high.  Not sure why that would be.,  She needs next available ROV with me

## 2017-12-24 NOTE — Telephone Encounter (Signed)
Left message on machine to call back  

## 2017-12-25 NOTE — Telephone Encounter (Signed)
The pt is aware and will call back to set up f/u appt.

## 2017-12-25 NOTE — Telephone Encounter (Signed)
Left message on machine to call back  

## 2018-01-06 ENCOUNTER — Ambulatory Visit (INDEPENDENT_AMBULATORY_CARE_PROVIDER_SITE_OTHER): Payer: BC Managed Care – PPO | Admitting: Physician Assistant

## 2018-01-06 VITALS — BP 108/71 | HR 77 | Temp 97.9°F | Ht 63.0 in | Wt 270.0 lb

## 2018-01-06 DIAGNOSIS — E7849 Other hyperlipidemia: Secondary | ICD-10-CM | POA: Diagnosis not present

## 2018-01-06 DIAGNOSIS — Z9189 Other specified personal risk factors, not elsewhere classified: Secondary | ICD-10-CM | POA: Diagnosis not present

## 2018-01-06 DIAGNOSIS — E559 Vitamin D deficiency, unspecified: Secondary | ICD-10-CM

## 2018-01-06 DIAGNOSIS — E8881 Metabolic syndrome: Secondary | ICD-10-CM

## 2018-01-06 DIAGNOSIS — Z6841 Body Mass Index (BMI) 40.0 and over, adult: Secondary | ICD-10-CM

## 2018-01-06 MED ORDER — VITAMIN D (ERGOCALCIFEROL) 1.25 MG (50000 UNIT) PO CAPS: 50000.0000 [IU] | ORAL_CAPSULE | ORAL | 0 refills | Status: DC

## 2018-01-06 NOTE — Progress Notes (Signed)
Office: [phone]  /  Fax: [phone]   HPI:   Chief Complaint: OBESITY [name] is here to discuss her progress with her obesity treatment plan. She is on the Category 3 plan and is following her eating plan approximately 98 % of the time. She states she is doing Wii fitness for 45 minutes 1 time per week. [name] did very well with weight loss. She reports following plan very closely. She is meal planning well. 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  Her weight is 270 lb (122.5 kg) today and has had a weight loss of 6 pounds over a period of 3 weeks since her last visit. She has lost 23 lbs since starting treatment with [name].  Vitamin D Deficiency [name] has a diagnosis of vitamin D deficiency. She is currently taking prescription Vit D, last level not at goal. She denies nausea, vomiting or muscle weakness.  At risk for osteopenia and osteoporosis [name] is at higher risk of osteopenia and osteoporosis due to vitamin D deficiency.   Insulin Resistance [name] has a diagnosis of insulin resistance based on her elevated fasting insulin level >5. Although [name]'s blood glucose readings are still under good control, insulin resistance puts her at greater risk of metabolic syndrome and diabetes. She is taking metformin currently and continues to work on diet and exercise to decrease risk of diabetes. She denies polyphagia, hypoglycemia, nausea, vomiting, or diarrhea.  Hyperlipidemia [name] has hyperlipidemia and has been trying to improve her cholesterol levels with intensive lifestyle modification including a low saturated fat diet, exercise and weight loss. Last level at goal and she denies any chest pain, claudication or myalgias.  ALLERGIES: No Known Allergies  MEDICATIONS: Current Outpatient Medications on File Prior to Visit  Medication Sig Dispense Refill  . clomiPHENE (CLOMID) 50 MG tablet Take 50 mg by mouth every  30 (thirty) days. During menstrual cycles x 40month    . fexofenadine (ALLEGRA) 180 MG tablet Take 180 mg by mouth daily.    . InFLIXimab (REMICADE IV) IV infusion every 2 months    . metFORMIN (GLUCOPHAGE) 500 MG tablet Take 500 mg by mouth daily with breakfast.    . Prenatal Vit-Fe Fumarate-FA (PRENATAL PO) Take 1 tablet by mouth daily.     No current facility-administered medications on file prior to visit.     PAST MEDICAL HISTORY: Past Medical History:  Diagnosis Date  . Abrasion of upper arm with infection, left, sequela    started antibiotics lef arm oct 4th started antibiotics healing well  . Anemia    hx of 2 yrs ago  . Chicken pox   . Chronic rhinitis   . Contact dermatitis and other eczema, due to unspecified cause   . Crohn's 11-15-11   no issues in 2 yrs  . Fibroids 11-15-11   remains with fibroids  . Hernia   . Incisional hernia, RLQ 10/02/2011  . Infertility, female   . Mononucleosis   . Obesity   . Pilonidal abscess   . Rectal abscess   . 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   . Vitamin D deficiency     PAST SURGICAL HISTORY: Past Surgical History:  Procedure Laterality Date  . APPENDECTOMY  7/06  . COLONOSCOPY WITH PROPOFOL N/A 03/07/2017   Procedure: COLONOSCOPY WITH PROPOFOL;  Surgeon: JMilus Banister MD;  Location: [facility];  Service: Endoscopy;  Laterality: N/A;  . EYE SURGERY  11-15-11  2003-multiple stys  . ileocecal resection  1/11   crohns disease with fisula/stricture. Dr. Ronnald Collum  . KNEE ARTHROSCOPY  99, 02   Bil.  [name] MYOMECTOMY N/A 11/10/2014   Procedure: MYOMECTOMY;  Surgeon: Everett Graff, MD;  Location: [facility];  Service: Gynecology;  Laterality: N/A;  . PILONIDAL CYST EXCISION  2011, 2012   I&Ds  . VENTRAL HERNIA REPAIR  11/27/2011   Procedure: LAPAROSCOPIC VENTRAL HERNIA;  Surgeon: Adin Hector, MD;  Location: [facility];  Service: General;  Laterality: N/A;  Laparoscopic Exploration & Repair of Hernia in Abdomen  . WISDOM  TOOTH EXTRACTION      SOCIAL HISTORY: Social History   Tobacco Use  . Smoking status: Never Smoker  . Smokeless tobacco: Never Used  Substance Use Topics  . Alcohol use: No  . Drug use: No    FAMILY HISTORY: Family History  Problem Relation Age of Onset  . Aneurysm Mother        brain  . Heart disease Mother 67       heart anyrism  . Healthy Father        fairly  . Diabetes Sister        half sister  . Bipolar disorder Sister        half sister  . Colon cancer Maternal Grandmother   . Stomach cancer Paternal Grandfather   . Multiple sclerosis Sister     ROS: Review of Systems  Constitutional: Positive for weight loss.  Cardiovascular: Negative for chest pain and claudication.  Gastrointestinal: Negative for diarrhea, nausea and vomiting.  Musculoskeletal: Negative for myalgias.       Negative muscle weakness  Endo/Heme/Allergies:       Negative polyphagia Negative hypoglycemia    PHYSICAL EXAM: Blood pressure 108/71, pulse 77, temperature 97.9 F (36.6 C), temperature source Oral, height 5' 3"  (1.6 m), weight 270 lb (122.5 kg), SpO2 95 %. Body mass index is 47.83 kg/m. Physical Exam  Constitutional: She is oriented to person, place, and time. She appears well-developed and well-nourished.  Cardiovascular: Normal rate.  Pulmonary/Chest: Effort normal.  Musculoskeletal: Normal range of motion.  Neurological: She is oriented to person, place, and time.  Skin: Skin is warm and dry.  Psychiatric: She has a normal mood and affect. Her behavior is normal.  Vitals reviewed.   RECENT LABS AND TESTS: BMET    Component Value Date/Time   NA 138 10/02/2017 1245   K 4.2 10/02/2017 1245   CL 99 10/02/2017 1245   CO2 24 10/02/2017 1245   GLUCOSE 80 10/02/2017 1245   GLUCOSE 77 07/23/2017 1606   BUN 8 10/02/2017 1245   CREATININE 0.65 10/02/2017 1245   CALCIUM 8.9 10/02/2017 1245   GFRNONAA 116 10/02/2017 1245   GFRAA 134 10/02/2017 1245   Lab Results    Component Value Date   HGBA1C 6.0 (H) 10/02/2017   HGBA1C 6.1 07/23/2017   Lab Results  Component Value Date   INSULIN 9.8 10/02/2017   CBC    Component Value Date/Time   WBC 13.1 (H) 10/02/2017 1245   WBC 14.4 (H) 07/23/2017 1606   RBC 4.53 10/02/2017 1245   RBC 4.42 07/23/2017 1606   HGB 11.2 10/02/2017 1245   HCT 36.9 10/02/2017 1245   PLT 412.0 (H) 07/23/2017 1606   MCV 82 10/02/2017 1245   MCH 24.7 (L) 10/02/2017 1245   MCH 29.2 11/12/2014 0536   MCHC 30.4 (L) 10/02/2017 1245   MCHC 31.6 07/23/2017 1606  RDW 17.4 (H) 10/02/2017 1245   LYMPHSABS 3.9 (H) 10/02/2017 1245   MONOABS 1.1 (H) 07/23/2017 1606   EOSABS 0.2 10/02/2017 1245   BASOSABS 0.0 10/02/2017 1245   Iron/TIBC/Ferritin/ %Sat    Component Value Date/Time   IRON 114 07/29/2013 0825   FERRITIN 14.8 07/29/2013 0825   IRONPCTSAT 36.8 07/29/2013 0825   Lipid Panel     Component Value Date/Time   CHOL 149 10/02/2017 1245   TRIG 75 10/02/2017 1245   HDL 51 10/02/2017 1245   CHOLHDL 3 07/23/2017 1606   VLDL 9.4 07/23/2017 1606   LDLCALC 83 10/02/2017 1245   Hepatic Function Panel     Component Value Date/Time   PROT 7.1 10/02/2017 1245   ALBUMIN 3.5 10/02/2017 1245   AST 13 10/02/2017 1245   ALT 11 10/02/2017 1245   ALKPHOS 105 10/02/2017 1245   BILITOT 0.2 10/02/2017 1245   BILIDIR 0.1 05/29/2016 0938      Component Value Date/Time   TSH 1.130 10/02/2017 1245   TSH 1.10 07/23/2017 1606   TSH 1.86 05/29/2016 0938  Results for WINNI, EHRHARD (MRN [id number]) as of 01/06/2018 17:25  Ref. Range 10/02/2017 12:45  Vitamin D, 25-Hydroxy Latest Ref Range: 30.0 - 100.0 ng/mL 24.9 (L)    ASSESSMENT AND PLAN: Vitamin D deficiency - Plan: VITAMIN D 25 Hydroxy (Vit-D Deficiency, Fractures), Vitamin D, Ergocalciferol, (DRISDOL) 50000 units CAPS capsule  Insulin resistance - Plan: Comprehensive metabolic panel, Hemoglobin A1c, Insulin, random  Other hyperlipidemia - Plan: Lipid Panel With LDL/HDL  Ratio  At risk for osteoporosis  Class 3 severe obesity with serious comorbidity and body mass index (BMI) of 45.0 to 49.9 in adult, unspecified obesity type (HCC)  PLAN:  Vitamin D Deficiency Gaila was informed that low vitamin D levels contributes to fatigue and are associated with obesity, breast, and colon cancer. Jalaiyah agrees to continue taking prescription Vit D @50 ,000 IU every week #4 and we will refill for 1 month. She will follow up for routine testing of vitamin D, at least 2-3 times per year. She was informed of the risk of over-replacement of vitamin D and agrees to not increase her dose unless she discusses this with [name] first. We will check labs and Jeslynn agrees to follow up with our clinic in 4 weeks.  At risk for osteopenia and osteoporosis Yanelle is at risk for osteopenia and osteoporsis due to her vitamin D deficiency. She was encouraged to take her vitamin D and follow her higher calcium diet and increase strengthening exercise to help strengthen her bones and decrease her risk of osteopenia and osteoporosis.  Insulin Resistance Azzie will continue to work on weight loss, diet, exercise, and decreasing simple carbohydrates in her diet to help decrease the risk of diabetes. We dicussed metformin including benefits and risks. She was informed that eating too many simple carbohydrates or too many calories at one sitting increases the likelihood of GI side effects. Gabbrielle agrees to continue taking metformin and we will check labs. Jatavia agrees to follow up with our clinic in 4 weeks as directed to monitor her progress.  Hyperlipidemia Chandrea was informed of the American Heart Association Guidelines emphasizing intensive lifestyle modifications as the first line treatment for hyperlipidemia. We discussed many lifestyle modifications today in depth, and Navi will continue to work on decreasing saturated fats such as fatty red meat, butter and many fried foods. She will also  increase vegetables and lean protein in her diet and continue to work on diet,  exercise, and weight loss efforts. We will check labs and Tiffanni agrees to follow up with our clinic in 4 weeks.  Obesity Omara is currently in the action stage of change. As such, her goal is to continue with weight loss efforts She has agreed to follow the Category 3 plan Fatemah has been instructed to work up to a goal of 150 minutes of combined cardio and strengthening exercise per week for weight loss and overall health benefits. We discussed the following Behavioral Modification Strategies today: work on meal planning and easy cooking plans and planning for success   Laynie has agreed to follow up with our clinic in 4 weeks. She was informed of the importance of frequent follow up visits to maximize her success with intensive lifestyle modifications for her multiple health conditions.   OBESITY BEHAVIORAL INTERVENTION VISIT  Today's visit was # 6 out of 22.  Starting weight: 293 lbs Starting date: 10/02/17 Today's weight : 270 lbs Today's date: 01/06/2018 Total lbs lost to date: 70    ASK: We discussed the diagnosis of obesity with [name] today and [name] agreed to give [name] permission to discuss obesity behavioral modification therapy today.  ASSESS: Najiyah has the diagnosis of obesity and her BMI today is 47.84 [name] is in the action stage of change   ADVISE: Cardelia was educated on the multiple health risks of obesity as well as the benefit of weight loss to improve her health. She was advised of the need for long term treatment and the importance of lifestyle modifications.  AGREE: Multiple dietary modification options and treatment options were discussed and  Lilliah agreed to the above obesity treatment plan.  [name], am acting as transcriptionist for Abby Potash, PA-C I, Abby Potash, PA-C have reviewed above note and agree with its content

## 2018-01-07 LAB — COMPREHENSIVE METABOLIC PANEL
ALT: 11 IU/L (ref 0–32)
AST: 15 IU/L (ref 0–40)
Albumin/Globulin Ratio: 0.9 — ABNORMAL LOW (ref 1.2–2.2)
Albumin: 3.5 g/dL (ref 3.5–5.5)
Alkaline Phosphatase: 97 IU/L (ref 39–117)
BUN/Creatinine Ratio: 14 (ref 9–23)
BUN: 10 mg/dL (ref 6–20)
Bilirubin Total: 0.2 mg/dL (ref 0.0–1.2)
CALCIUM: 8.9 mg/dL (ref 8.7–10.2)
CO2: 23 mmol/L (ref 20–29)
CREATININE: 0.69 mg/dL (ref 0.57–1.00)
Chloride: 101 mmol/L (ref 96–106)
GFR calc Af Amer: 131 mL/min/{1.73_m2} (ref >59)
GFR, EST NON AFRICAN AMERICAN: 114 mL/min/{1.73_m2} (ref >59)
Globulin, Total: 3.9 g/dL (ref 1.5–4.5)
Glucose: 73 mg/dL (ref 65–99)
Potassium: 4.2 mmol/L (ref 3.5–5.2)
Sodium: 139 mmol/L (ref 134–144)
Total Protein: 7.4 g/dL (ref 6.0–8.5)

## 2018-01-07 LAB — LIPID PANEL WITH LDL/HDL RATIO
CHOLESTEROL TOTAL: 138 mg/dL (ref 100–199)
HDL: 43 mg/dL (ref >39)
LDL Calculated: 85 mg/dL (ref 0–99)
LDl/HDL Ratio: 2 ratio (ref 0.0–3.2)
TRIGLYCERIDES: 48 mg/dL (ref 0–149)
VLDL Cholesterol Cal: 10 mg/dL (ref 5–40)

## 2018-01-07 LAB — INSULIN, RANDOM: INSULIN: 10.1 u[IU]/mL (ref 2.6–24.9)

## 2018-01-07 LAB — HEMOGLOBIN A1C
Est. average glucose Bld gHb Est-mCnc: 114 mg/dL
Hgb A1c MFr Bld: 5.6 % (ref 4.8–5.6)

## 2018-01-07 LAB — VITAMIN D 25 HYDROXY (VIT D DEFICIENCY, FRACTURES): Vit D, 25-Hydroxy: 38.4 ng/mL (ref 30.0–100.0)

## 2018-02-04 ENCOUNTER — Ambulatory Visit (INDEPENDENT_AMBULATORY_CARE_PROVIDER_SITE_OTHER): Payer: BC Managed Care – PPO | Admitting: Physician Assistant

## 2018-02-04 VITALS — BP 94/61 | HR 90 | Temp 97.1°F | Ht 63.0 in | Wt 263.0 lb

## 2018-02-04 DIAGNOSIS — R7303 Prediabetes: Secondary | ICD-10-CM | POA: Diagnosis not present

## 2018-02-04 DIAGNOSIS — Z9189 Other specified personal risk factors, not elsewhere classified: Secondary | ICD-10-CM | POA: Diagnosis not present

## 2018-02-04 DIAGNOSIS — E559 Vitamin D deficiency, unspecified: Secondary | ICD-10-CM | POA: Diagnosis not present

## 2018-02-04 DIAGNOSIS — Z6841 Body Mass Index (BMI) 40.0 and over, adult: Secondary | ICD-10-CM

## 2018-02-04 MED ORDER — VITAMIN D (ERGOCALCIFEROL) 1.25 MG (50000 UNIT) PO CAPS: 50000.0000 [IU] | ORAL_CAPSULE | ORAL | 0 refills | Status: DC

## 2018-02-04 MED ORDER — METFORMIN HCL 500 MG PO TABS
500.0000 mg | ORAL_TABLET | Freq: Every day | ORAL | 0 refills | Status: DC
Start: 2018-02-04 — End: 2018-02-26

## 2018-02-05 NOTE — Progress Notes (Signed)
Office: [phone]  /  Fax: [phone]   HPI:   Chief Complaint: OBESITY [name] is here to discuss her progress with her obesity treatment plan. She is on the Category 3 plan and is following her eating plan approximately 97 % of the time. She states she is exercising 0 minutes 0 times per week. [name] did very well with weight loss. 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 She has been meal planning very well.  Her weight is 263 lb (119.3 kg) today and has had a weight loss of 7 pounds over a period of 4 weeks since her last visit. She has lost 30 lbs since starting treatment with [name].  Vitamin D Deficiency [name] has a diagnosis of vitamin D deficiency. She is on prescription Vit D and denies nausea, vomiting or muscle weakness.  At risk for osteopenia and osteoporosis Donna is at higher risk of osteopenia and osteoporosis due to vitamin D deficiency.   Pre-Diabetes [name] has a diagnosis of pre-diabetes based on her elevated Hgb A1c and was informed this puts her at greater risk of developing diabetes. She is taking metformin currently and last A1c improved at 5.6. She continues to work on diet and exercise to decrease risk of diabetes. She denies polyphagia or hypoglycemia.  ALLERGIES: No Known Allergies  MEDICATIONS: Current Outpatient Medications on File Prior to Visit  Medication Sig Dispense Refill  . clomiPHENE (CLOMID) 50 MG tablet Take 50 mg by mouth every 30 (thirty) days. During menstrual cycles x 81month    . fexofenadine (ALLEGRA) 180 MG tablet Take 180 mg by mouth daily.    . InFLIXimab (REMICADE IV) IV infusion every 2 months    . Prenatal Vit-Fe Fumarate-FA (PRENATAL PO) Take 1 tablet by mouth daily.     No current facility-administered medications on file prior to visit.     PAST MEDICAL HISTORY: Past Medical History:  Diagnosis Date  . Abrasion of upper arm with infection, left, sequela    started antibiotics  lef arm oct 4th started antibiotics healing well  . Anemia    hx of 2 yrs ago  . Chicken pox   . Chronic rhinitis   . Contact dermatitis and other eczema, due to unspecified cause   . Crohn's 11-15-11   no issues in 2 yrs  . Fibroids 11-15-11   remains with fibroids  . Hernia   . Incisional hernia, RLQ 10/02/2011  . Infertility, female   . Mononucleosis   . Obesity   . Pilonidal abscess   . Rectal abscess   . 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   . Vitamin D deficiency     PAST SURGICAL HISTORY: Past Surgical History:  Procedure Laterality Date  . APPENDECTOMY  7/06  . COLONOSCOPY WITH PROPOFOL N/A 03/07/2017   Procedure: COLONOSCOPY WITH PROPOFOL;  Surgeon: JMilus Banister MD;  Location: [facility];  Service: Endoscopy;  Laterality: N/A;  . EYE SURGERY  11-15-11   2003-multiple stys  . ileocecal resection  1/11   crohns disease with fisula/stricture. Dr. ARonnald Collum . KNEE ARTHROSCOPY  99, 02   Bil.  .Marland KitchenMYOMECTOMY N/A 11/10/2014   Procedure: MYOMECTOMY;  Surgeon: AEverett Graff MD;  [facility];  Service: Gynecology;  Laterality: N/A;  . PILONIDAL CYST EXCISION  2011, 2012   I&Ds  . VENTRAL HERNIA REPAIR  11/27/2011   Procedure: LAPAROSCOPIC VENTRAL HERNIA;  Surgeon: SAdin Hector MD;  Location:  [facility];  Service: General;  Laterality: N/A;  Laparoscopic Exploration & Repair of Hernia in Abdomen  . WISDOM TOOTH EXTRACTION      SOCIAL HISTORY: Social History   Tobacco Use  . Smoking status: Never Smoker  . Smokeless tobacco: Never Used  Substance Use Topics  . Alcohol use: No  . Drug use: No    FAMILY HISTORY: Family History  Problem Relation Age of Onset  . Aneurysm Mother        brain  . Heart disease Mother 12       heart anyrism  . Healthy Father        fairly  . Diabetes Sister        half sister  . Bipolar disorder Sister        half sister  . Colon cancer Maternal Grandmother   . Stomach cancer Paternal Grandfather   . Multiple  sclerosis Sister     ROS: Review of Systems  Constitutional: Positive for weight loss.  Gastrointestinal: Negative for nausea and vomiting.  Musculoskeletal:       Negative muscle weakness  Endo/Heme/Allergies:       Negative polyphagia Negative hypoglycemia    PHYSICAL EXAM: Blood pressure 94/61, pulse 90, temperature (!) 97.1 F (36.2 C), temperature source Oral, height 5' 3"  (1.6 m), weight 263 lb (119.3 kg), last menstrual period 01/07/2018, SpO2 96 %. Body mass index is 46.59 kg/m. Physical Exam  Constitutional: She is oriented to person, place, and time. She appears well-developed and well-nourished.  Cardiovascular: Normal rate.  Pulmonary/Chest: Effort normal.  Musculoskeletal: Normal range of motion.  Neurological: She is oriented to person, place, and time.  Skin: Skin is warm and dry.  Psychiatric: She has a normal mood and affect. Her behavior is normal.  Vitals reviewed.   RECENT LABS AND TESTS: BMET    Component Value Date/Time   NA 139 01/06/2018 1122   K 4.2 01/06/2018 1122   CL 101 01/06/2018 1122   CO2 23 01/06/2018 1122   GLUCOSE 73 01/06/2018 1122   GLUCOSE 77 07/23/2017 1606   BUN 10 01/06/2018 1122   CREATININE 0.69 01/06/2018 1122   CALCIUM 8.9 01/06/2018 1122   GFRNONAA 114 01/06/2018 1122   GFRAA 131 01/06/2018 1122   Lab Results  Component Value Date   HGBA1C 5.6 01/06/2018   HGBA1C 6.0 (H) 10/02/2017   HGBA1C 6.1 07/23/2017   Lab Results  Component Value Date   INSULIN 10.1 01/06/2018   INSULIN 9.8 10/02/2017   CBC    Component Value Date/Time   WBC 13.1 (H) 10/02/2017 1245   WBC 14.4 (H) 07/23/2017 1606   RBC 4.53 10/02/2017 1245   RBC 4.42 07/23/2017 1606   HGB 11.2 10/02/2017 1245   HCT 36.9 10/02/2017 1245   PLT 412.0 (H) 07/23/2017 1606   MCV 82 10/02/2017 1245   MCH 24.7 (L) 10/02/2017 1245   MCH 29.2 11/12/2014 0536   MCHC 30.4 (L) 10/02/2017 1245   MCHC 31.6 07/23/2017 1606   RDW 17.4 (H) 10/02/2017 1245    LYMPHSABS 3.9 (H) 10/02/2017 1245   MONOABS 1.1 (H) 07/23/2017 1606   EOSABS 0.2 10/02/2017 1245   BASOSABS 0.0 10/02/2017 1245   Iron/TIBC/Ferritin/ %Sat    Component Value Date/Time   IRON 114 07/29/2013 0825   FERRITIN 14.8 07/29/2013 0825   IRONPCTSAT 36.8 07/29/2013 0825   Lipid Panel     Component Value Date/Time   CHOL 138 01/06/2018 1122   TRIG 48 01/06/2018  1122   HDL 43 01/06/2018 1122   CHOLHDL 3 07/23/2017 1606   VLDL 9.4 07/23/2017 1606   LDLCALC 85 01/06/2018 1122   Hepatic Function Panel     Component Value Date/Time   PROT 7.4 01/06/2018 1122   ALBUMIN 3.5 01/06/2018 1122   AST 15 01/06/2018 1122   ALT 11 01/06/2018 1122   ALKPHOS 97 01/06/2018 1122   BILITOT 0.2 01/06/2018 1122   BILIDIR 0.1 05/29/2016 0938      Component Value Date/Time   TSH 1.130 10/02/2017 1245   TSH 1.10 07/23/2017 1606   TSH 1.86 05/29/2016 0938  Results for CHENIKA, NEVILS (MRN [id number]) as of 02/05/2018 08:22  Ref. Range 01/06/2018 11:22  Vitamin D, 25-Hydroxy Latest Ref Range: 30.0 - 100.0 ng/mL 38.4    ASSESSMENT AND PLAN: Vitamin D deficiency - Plan: Vitamin D, Ergocalciferol, (DRISDOL) 50000 units CAPS capsule  Prediabetes - Plan: metFORMIN (GLUCOPHAGE) 500 MG tablet  At risk for osteoporosis  Class 3 severe obesity with serious comorbidity and body mass index (BMI) of 45.0 to 49.9 in adult, unspecified obesity type ([place])  PLAN:  Vitamin D Deficiency Shavaun was informed that low vitamin D levels contributes to fatigue and are associated with obesity, breast, and colon cancer. Alisha agrees to continue taking prescription Vit D @50 ,000 IU every week #4 and we will refill for 1 month. She will follow up for routine testing of vitamin D, at least 2-3 times per year. She was informed of the risk of over-replacement of vitamin D and agrees to not increase her dose unless she discusses this with [name] first. Fatema agrees to follow up with our clinic in 3 to 4 weeks.  At risk  for osteopenia and osteoporosis Leanora was given extended  (15 minutes) osteoporosis prevention counseling today. Mikesha is at risk for osteopenia and osteoporsis due to her vitamin D deficiency. She was encouraged to take her vitamin D and follow her higher calcium diet and increase strengthening exercise to help strengthen her bones and decrease her risk of osteopenia and osteoporosis.  Pre-Diabetes Orabelle will continue to work on weight loss, exercise, and decreasing simple carbohydrates in her diet to help decrease the risk of diabetes. We dicussed metformin including benefits and risks. She was informed that eating too many simple carbohydrates or too many calories at one sitting increases the likelihood of GI side effects. Escarlet agrees to continue taking metformin 500 mg q AM #30 and we will refill for 1 month. Julyssa agrees to follow up with our clinic in 3 to 4 weeks as directed to monitor her progress.  Obesity Oluwasemilore is currently in the action stage of change. As such, her goal is to continue with weight loss efforts She has agreed to follow the Category 3 plan Rozell has been instructed to work up to a goal of 150 minutes of combined cardio and strengthening exercise per week for weight loss and overall health benefits. We discussed the following Behavioral Modification Strategies today: work on meal planning and easy cooking plans, increase H20 intake, and planning for success   Christiann has agreed to follow up with our clinic in 3 to 4 weeks. She was informed of the importance of frequent follow up visits to maximize her success with intensive lifestyle modifications for her multiple health conditions.   OBESITY BEHAVIORAL INTERVENTION VISIT  Today's visit was # 7   Starting weight: 293 lbs Starting date: 10/02/17 Today's weight : 263 lbs  Today's date: 02/04/2018 Total lbs lost  to date: 77    ASK: We discussed the diagnosis of obesity with Standley Dakins today and Krissa agreed  to give [name] permission to discuss obesity behavioral modification therapy today.  ASSESS: Zonia has the diagnosis of obesity and her BMI today is 46.6 Aime is in the action stage of change   ADVISE: Selin was educated on the multiple health risks of obesity as well as the benefit of weight loss to improve her health. She was advised of the need for long term treatment and the importance of lifestyle modifications.  AGREE: Multiple dietary modification options and treatment options were discussed and  Silena agreed to the above obesity treatment plan.  Wilhemena Durie, am acting as transcriptionist for Abby Potash, PA-C I, Abby Potash, PA-C have reviewed above note and agree with its content

## 2018-02-24 LAB — HM PAP SMEAR: HM Pap smear: NORMAL

## 2018-02-26 ENCOUNTER — Encounter (INDEPENDENT_AMBULATORY_CARE_PROVIDER_SITE_OTHER): Payer: Self-pay | Admitting: Physician Assistant

## 2018-02-26 ENCOUNTER — Ambulatory Visit (INDEPENDENT_AMBULATORY_CARE_PROVIDER_SITE_OTHER): Payer: BC Managed Care – PPO | Admitting: Physician Assistant

## 2018-02-26 VITALS — BP 100/66 | HR 81 | Temp 98.2°F | Ht 63.0 in | Wt 262.0 lb

## 2018-02-26 DIAGNOSIS — Z9189 Other specified personal risk factors, not elsewhere classified: Secondary | ICD-10-CM | POA: Diagnosis not present

## 2018-02-26 DIAGNOSIS — E559 Vitamin D deficiency, unspecified: Secondary | ICD-10-CM | POA: Diagnosis not present

## 2018-02-26 DIAGNOSIS — R7303 Prediabetes: Secondary | ICD-10-CM

## 2018-02-26 DIAGNOSIS — Z6841 Body Mass Index (BMI) 40.0 and over, adult: Secondary | ICD-10-CM

## 2018-02-26 MED ORDER — VITAMIN D (ERGOCALCIFEROL) 1.25 MG (50000 UNIT) PO CAPS: 50000.0000 [IU] | ORAL_CAPSULE | ORAL | 0 refills | Status: DC

## 2018-02-26 MED ORDER — METFORMIN HCL 500 MG PO TABS: 500.0000 mg | ORAL_TABLET | Freq: Every day | ORAL | 0 refills | Status: DC

## 2018-02-27 NOTE — Progress Notes (Signed)
Office: [phone]  /  Fax: [phone]   HPI:   Chief Complaint: OBESITY [name] is here to discuss her progress with her obesity treatment plan. She is on the Category 3 plan and is following her eating plan approximately 92 % of the time. She states she is exercising 0 minutes 0 times per week. Enijah did well with weight loss. She reports being very stressed with work recently and working longer hours. She has been eating out more than normal.  Her weight is 262 lb (118.8 kg) today and has had a weight loss of 1 pound over a period of 3 weeks since her last visit. She has lost 31 lbs since starting treatment with [name].  Vitamin D Deficiency [name] has a diagnosis of vitamin D deficiency. She is currently taking prescription Vit D and denies nausea, vomiting or muscle weakness.  At risk for osteopenia and osteoporosis [name] is at higher risk of osteopenia and osteoporosis due to vitamin D deficiency.   Pre-Diabetes [name] has a diagnosis of pre-diabetes based on her elevated Hgb A1c and was informed this puts her at greater risk of developing diabetes. She is taking metformin currently and continues to work on diet and exercise to decrease risk of diabetes. She denies nausea or hypoglycemia.  ALLERGIES: No Known Allergies  MEDICATIONS: Current Outpatient Medications on File Prior to Visit  Medication Sig Dispense Refill  . clomiPHENE (CLOMID) 50 MG tablet Take 50 mg by mouth every 30 (thirty) days. During menstrual cycles x 67month    . fexofenadine (ALLEGRA) 180 MG tablet Take 180 mg by mouth daily.    . InFLIXimab (REMICADE IV) IV infusion every 2 months    . Prenatal Vit-Fe Fumarate-FA (PRENATAL PO) Take 1 tablet by mouth daily.     No current facility-administered medications on file prior to visit.     PAST MEDICAL HISTORY: Past Medical History:  Diagnosis Date  . Abrasion of upper arm with infection, left, sequela    started antibiotics lef arm oct 4th started  antibiotics healing well  . Anemia    hx of 2 yrs ago  . Chicken pox   . Chronic rhinitis   . Contact dermatitis and other eczema, due to unspecified cause   . Crohn's 11-15-11   no issues in 2 yrs  . Fibroids 11-15-11   remains with fibroids  . Hernia   . Incisional hernia, RLQ 10/02/2011  . Infertility, female   . Mononucleosis   . Obesity   . Pilonidal abscess   . Rectal abscess   . 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   . Vitamin D deficiency     PAST SURGICAL HISTORY: Past Surgical History:  Procedure Laterality Date  . APPENDECTOMY  7/06  . COLONOSCOPY WITH PROPOFOL N/A 03/07/2017   Procedure: COLONOSCOPY WITH PROPOFOL;  Surgeon: JMilus Banister MD;  Location: [facility];  Service: Endoscopy;  Laterality: N/A;  . EYE SURGERY  11-15-11   2003-multiple stys  . ileocecal resection  1/11   crohns disease with fisula/stricture. Dr. ARonnald Collum . KNEE ARTHROSCOPY  99, 02   Bil.  .Marland KitchenMYOMECTOMY N/A 11/10/2014   Procedure: MYOMECTOMY;  Surgeon: AEverett Graff MD;  [facility];  Service: Gynecology;  Laterality: N/A;  . PILONIDAL CYST EXCISION  2011, 2012   I&Ds  . VENTRAL HERNIA REPAIR  11/27/2011   Procedure: LAPAROSCOPIC VENTRAL HERNIA;  Surgeon: SAdin Hector MD;  Location: [facility];  Service: General;  Laterality: N/A;  Laparoscopic Exploration & Repair of Hernia in Abdomen  . WISDOM TOOTH EXTRACTION      SOCIAL HISTORY: Social History   Tobacco Use  . Smoking status: Never Smoker  . Smokeless tobacco: Never Used  Substance Use Topics  . Alcohol use: No  . Drug use: No    FAMILY HISTORY: Family History  Problem Relation Age of Onset  . Aneurysm Mother        brain  . Heart disease Mother 49       heart anyrism  . Healthy Father        fairly  . Diabetes Sister        half sister  . Bipolar disorder Sister        half sister  . Colon cancer Maternal Grandmother   . Stomach cancer Paternal Grandfather   . Multiple sclerosis Sister      ROS: Review of Systems  Constitutional: Positive for weight loss.  Gastrointestinal: Negative for diarrhea, nausea and vomiting.  Musculoskeletal:       Negative muscle weakness  Endo/Heme/Allergies:       Negative hypoglycemia    PHYSICAL EXAM: Blood pressure 100/66, pulse 81, temperature 98.2 F (36.8 C), temperature source Oral, height 5' 3"  (1.6 m), weight 262 lb (118.8 kg), SpO2 96 %. Body mass index is 46.41 kg/m. Physical Exam  Constitutional: She is oriented to person, place, and time. She appears well-developed and well-nourished.  Cardiovascular: Normal rate.  Pulmonary/Chest: Effort normal.  Musculoskeletal: Normal range of motion.  Neurological: She is oriented to person, place, and time.  Skin: Skin is warm and dry.  Psychiatric: She has a normal mood and affect. Her behavior is normal.  Vitals reviewed.   RECENT LABS AND TESTS: BMET    Component Value Date/Time   NA 139 01/06/2018 1122   K 4.2 01/06/2018 1122   CL 101 01/06/2018 1122   CO2 23 01/06/2018 1122   GLUCOSE 73 01/06/2018 1122   GLUCOSE 77 07/23/2017 1606   BUN 10 01/06/2018 1122   CREATININE 0.69 01/06/2018 1122   CALCIUM 8.9 01/06/2018 1122   GFRNONAA 114 01/06/2018 1122   GFRAA 131 01/06/2018 1122   Lab Results  Component Value Date   HGBA1C 5.6 01/06/2018   HGBA1C 6.0 (H) 10/02/2017   HGBA1C 6.1 07/23/2017   Lab Results  Component Value Date   INSULIN 10.1 01/06/2018   INSULIN 9.8 10/02/2017   CBC    Component Value Date/Time   WBC 13.1 (H) 10/02/2017 1245   WBC 14.4 (H) 07/23/2017 1606   RBC 4.53 10/02/2017 1245   RBC 4.42 07/23/2017 1606   HGB 11.2 10/02/2017 1245   HCT 36.9 10/02/2017 1245   PLT 412.0 (H) 07/23/2017 1606   MCV 82 10/02/2017 1245   MCH 24.7 (L) 10/02/2017 1245   MCH 29.2 11/12/2014 0536   MCHC 30.4 (L) 10/02/2017 1245   MCHC 31.6 07/23/2017 1606   RDW 17.4 (H) 10/02/2017 1245   LYMPHSABS 3.9 (H) 10/02/2017 1245   MONOABS 1.1 (H) 07/23/2017 1606    EOSABS 0.2 10/02/2017 1245   BASOSABS 0.0 10/02/2017 1245   Iron/TIBC/Ferritin/ %Sat    Component Value Date/Time   IRON 114 07/29/2013 0825   FERRITIN 14.8 07/29/2013 0825   IRONPCTSAT 36.8 07/29/2013 0825   Lipid Panel     Component Value Date/Time   CHOL 138 01/06/2018 1122   TRIG 48 01/06/2018 1122   HDL 43 01/06/2018 1122   CHOLHDL 3 07/23/2017 1606  VLDL 9.4 07/23/2017 1606   LDLCALC 85 01/06/2018 1122   Hepatic Function Panel     Component Value Date/Time   PROT 7.4 01/06/2018 1122   ALBUMIN 3.5 01/06/2018 1122   AST 15 01/06/2018 1122   ALT 11 01/06/2018 1122   ALKPHOS 97 01/06/2018 1122   BILITOT 0.2 01/06/2018 1122   BILIDIR 0.1 05/29/2016 0938      Component Value Date/Time   TSH 1.130 10/02/2017 1245   TSH 1.10 07/23/2017 1606   TSH 1.86 05/29/2016 0938   Results for KARIANNE, NOGUEIRA (MRN [id number]) as of 02/27/2018 09:07  Ref. Range 01/06/2018 11:22  Vitamin D, 25-Hydroxy Latest Ref Range: 30.0 - 100.0 ng/mL 38.4   ASSESSMENT AND PLAN: Vitamin D deficiency - Plan: Vitamin D, Ergocalciferol, (DRISDOL) 50000 units CAPS capsule  Prediabetes - Plan: metFORMIN (GLUCOPHAGE) 500 MG tablet  At risk for osteoporosis  Class 3 severe obesity with serious comorbidity and body mass index (BMI) of 45.0 to 49.9 in adult, unspecified obesity type ([place])  PLAN:  Vitamin D Deficiency Dempsey was informed that low vitamin D levels contributes to fatigue and are associated with obesity, breast, and colon cancer. Caitlinn agrees to continue taking prescription Vit D @50 ,000 IU every week #4 and we will refill for 1 month. She will follow up for routine testing of vitamin D, at least 2-3 times per year. She was informed of the risk of over-replacement of vitamin D and agrees to not increase her dose unless she discusses this with [name] first. Saumya agrees to follow up with our clinic in 3 weeks.  At risk for osteopenia and osteoporosis Anyely was given extended  (15 minutes)  osteoporosis prevention counseling today. Floyd is at risk for osteopenia and osteoporsis due to her vitamin D deficiency. She was encouraged to take her vitamin D and follow her higher calcium diet and increase strengthening exercise to help strengthen her bones and decrease her risk of osteopenia and osteoporosis.  Pre-Diabetes Ariam will continue to work on weight loss, exercise, and decreasing simple carbohydrates in her diet to help decrease the risk of diabetes. We dicussed metformin including benefits and risks. She was informed that eating too many simple carbohydrates or too many calories at one sitting increases the likelihood of GI side effects. Nikkole agrees to continue taking metformin 500 mg q AM #30 and we will refill for 1 month. Ivori agrees to follow up with our clinic in 3 weeks as directed to monitor her progress.  Obesity Komal is currently in the action stage of change. As such, her goal is to continue with weight loss efforts She has agreed to follow the Category 3 plan Tahiry has been instructed to work up to a goal of 150 minutes of combined cardio and strengthening exercise per week for weight loss and overall health benefits. We discussed the following Behavioral Modification Strategies today: decrease eating out, work on meal planning and easy cooking plans, and better snacking choices   Allante has agreed to follow up with our clinic in 3 weeks. She was informed of the importance of frequent follow up visits to maximize her success with intensive lifestyle modifications for her multiple health conditions.   OBESITY BEHAVIORAL INTERVENTION VISIT  Today's visit was # 8   Starting weight: 293 lbs Starting date: 10/02/17 Today's weight : 262 lbs Today's date: 02/26/2018 Total lbs lost to date: 62    ASK: We discussed the diagnosis of obesity with Standley Dakins today and Danyia agreed to give  [name] permission to discuss obesity behavioral modification therapy  today.  ASSESS: Avni has the diagnosis of obesity and her BMI today is 46.42 Shalan is in the action stage of change   ADVISE: Jeaneen was educated on the multiple health risks of obesity as well as the benefit of weight loss to improve her health. She was advised of the need for long term treatment and the importance of lifestyle modifications.  AGREE: Multiple dietary modification options and treatment options were discussed and  Shital agreed to the above obesity treatment plan.  Wilhemena Durie, am acting as transcriptionist for Abby Potash, PA-C I, Abby Potash, PA-C have reviewed above note and agree with its content

## 2018-03-05 ENCOUNTER — Ambulatory Visit: Payer: BC Managed Care – PPO | Admitting: Gastroenterology

## 2018-03-05 ENCOUNTER — Encounter: Payer: Self-pay | Admitting: Gastroenterology

## 2018-03-05 ENCOUNTER — Other Ambulatory Visit: Payer: BC Managed Care – PPO

## 2018-03-05 VITALS — BP 112/70 | HR 68 | Ht 62.5 in | Wt 261.1 lb

## 2018-03-05 DIAGNOSIS — Z23 Encounter for immunization: Secondary | ICD-10-CM | POA: Diagnosis not present

## 2018-03-05 DIAGNOSIS — K5090 Crohn's disease, unspecified, without complications: Secondary | ICD-10-CM

## 2018-03-05 NOTE — Progress Notes (Signed)
This editor: Milus Banister, MD (Physician)    Review of gastrointestinal problems:  1. Crohn ileitis.Presented like an acute appendicitis 2006. Surgery, not in [place], was much more suggestive of Crohn's disease. 02/2005 colonoscopy Dr. Ardis Hughs: rectum to cecum inflammation (erosions, granularity, TI nodular, friable, ulcerated. Path showed chronic, active TI inflammation; normal colon biopsies.  She did not respond to immunomodulators and so eventually she Began Remicade April 2007 While continuing on azathioprine. Early 2008, stopped azathioprine and she tolerated solo maintenance Remicade (5 mg per kilogram) well. April 2009 continues to tolerate Remicade infusions very well, getting her infusions at Dr. Tanna Furry office. 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 Her Remicade dosing was increased to 10 mg per kilogram. Winter, 2010 presented with right lower quadrant abscess, clear fistula connection between small bowel and the abscess. 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 Infection was drained with interventional radiology, drain eventually pulled however abscess recurred within days and so right lower quadrant drain was replaced. January, 2011: she is on azathioprine 100 mg a day, Augmentin twice daily, planning for surgery late January. January, 2011: ileocecectomy performed for active disease, persistent fistula, abscess. April, 2012 doing well on Remicade every 8 weeks.  10/2012 doing well.  01/2016 doing well on remicade 73m/kg every 8 weeks Dr. BMelissa Noonoffice.  Colonoscopy Dr. JArdis Hughs10/2018 showed normal, widely patent ileocecal anastomosis.  A few small erosions were noted just proximal to the IC anastomosis.  The colon was normal.  Pathology showed mild colonic inflammation in the small bowel only.  Colon biopsies were normal. 2. May, 2011: polynidol  cystdrained by urgent care; drained another time 2011. eventually surgically resected spring 2012 3. Morbid obesity; BMI 49 2017; BMI 53 2018 BMI 47 2019     HPI: This is a very pleasant 37year old woman whom I last saw about a year ago at the time of colonoscopy.  See those results summarized above.  She continues Remicade 10 mg/kg every 8 weeks and on that regimen she has no issues with her bowels.  She has a single bowel movement every day or 2, it is brown, solid, nonbloody.  She has no abdominal pains.  Chief complaint is Crohn's ileitis  She's lost 30 pounds after starting a new bariatric program  ROS: complete GI ROS as described in HPI, all other review negative.  [facility].  Constitutional:  No unintentional weight loss   Past Medical History:  Diagnosis Date  . Abrasion of upper arm with infection, left, sequela    started antibiotics lef arm oct 4th started antibiotics healing well  . Anemia    hx of 2 yrs ago  . Chicken pox   . Chronic rhinitis   . Contact dermatitis and other eczema, due to unspecified cause   . Crohn's 11-15-11   no issues in 2 yrs  . Fibroids 11-15-11   remains with fibroids  . Hernia   . Incisional hernia, RLQ 10/02/2011  . Infertility, female   . Mononucleosis   . Obesity   . Pilonidal abscess   . Rectal abscess   . 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[place]   . Vitamin D deficiency     Past Surgical History:  Procedure Laterality Date  . APPENDECTOMY  7/06  . COLONOSCOPY WITH PROPOFOL N/A 03/07/2017   Procedure: COLONOSCOPY WITH PROPOFOL;  Surgeon:  Milus Banister, MD;  Location: [facility];  Service: Endoscopy;  Laterality: N/A;  . EYE SURGERY  11-15-11   2003-multiple stys  . ileocecal resection  1/11   crohns disease with fisula/stricture. Dr. Ronnald Collum  . KNEE ARTHROSCOPY  99, 02   Bil.  [name] MYOMECTOMY N/A 11/10/2014   Procedure: MYOMECTOMY;  Surgeon: Everett Graff, MD;  Location:  [facility];  Service: Gynecology;  Laterality: N/A;  . PILONIDAL CYST EXCISION  2011, 2012   I&Ds  . VENTRAL HERNIA REPAIR  11/27/2011   Procedure: LAPAROSCOPIC VENTRAL HERNIA;  Surgeon: Adin Hector, MD;  Location: [facility];  Service: General;  Laterality: N/A;  Laparoscopic Exploration & Repair of Hernia in Abdomen  . WISDOM TOOTH EXTRACTION      Current Outpatient Medications  Medication Sig Dispense Refill  . fexofenadine (ALLEGRA) 180 MG tablet Take 180 mg by mouth daily.    . InFLIXimab (REMICADE IV) IV infusion every 2 months    . metFORMIN (GLUCOPHAGE) 500 MG tablet Take 1 tablet (500 mg total) by mouth daily with breakfast. 30 tablet 0  . Prenatal Vit-Fe Fumarate-FA (PRENATAL PO) Take 1 tablet by mouth daily.    . Vitamin D, Ergocalciferol, (DRISDOL) 50000 units CAPS capsule Take 1 capsule (50,000 Units total) by mouth every 7 (seven) days. 4 capsule 0  . letrozole (FEMARA) 2.5 MG tablet Take 1 tablet by mouth daily.     No current facility-administered medications for this visit.     Allergies as of 03/05/2018  . (No Known Allergies)    Family History  Problem Relation Age of Onset  . Aneurysm Mother        brain  . Heart disease Mother 85       heart anyrism  . Healthy Father        fairly  . Diabetes Sister        half sister  . Bipolar disorder Sister        half sister  . Colon cancer Maternal Grandmother   . Stomach cancer Paternal Grandfather   . Multiple sclerosis Sister     Social History   Socioeconomic History  . Marital status: Married    Spouse name: Eudora Guevarra  . Number of children: Not on file  . Years of education: Not on file  . Highest education level: Not on file  Occupational History  . Occupation: Music therapist  Social Needs  . Financial resource strain: Not on file  . Food insecurity:    Worry: Not on file    Inability: Not on file  . Transportation needs:    Medical: Not on file    Non-medical: Not on file  Tobacco Use  . Smoking  status: Never Smoker  . Smokeless tobacco: Never Used  Substance and Sexual Activity  . Alcohol use: No  . Drug use: No  . Sexual activity: Yes    Partners: Male  Lifestyle  . Physical activity:    Days per week: Not on file    Minutes per session: Not on file  . Stress: Not on file  Relationships  . Social connections:    Talks on phone: Not on file    Gets together: Not on file    Attends religious service: Not on file    Active member of club or organization: Not on file    Attends meetings of clubs or organizations: Not on file    Relationship status: Not on file  . Intimate  partner violence:    Fear of current or ex partner: Not on file    Emotionally abused: Not on file    Physically abused: Not on file    Forced sexual activity: Not on file  Other Topics Concern  . 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 female with Crohn's ileitis  I commended her on her success losing weight over the past few months.  She seems very committed to continuing this and I did explain to her that I think her size, morbid obesity is a probably a bigger health concern overall than her Crohn's.  She understands and agrees.  Her Crohn's is under good clinical remission on 10 mg/kg Remicade every 8 weeks.  She will get TB test today, flu shot today.  She will return to see me in 1 year and sooner if needed.  Please see the "Patient  Instructions" section for addition details about the plan.  Owens Loffler, MD [facility] 03/05/2018, 9:05 AM

## 2018-03-05 NOTE — Patient Instructions (Addendum)
You will have labs checked today in the basement lab.  Please head down after you check out with the front desk  (quant gold TB testing). Flu Shot today. Please return to see Dr. Ardis Hughs in 1 year, sooner if any problems.   Thank you for entrusting me with your care and choosing [facility].  Dr Ardis Hughs

## 2018-03-07 LAB — QUANTIFERON-TB GOLD PLUS
Mitogen-NIL: 8.3 IU/mL
NIL: 0.04 IU/mL
QUANTIFERON-TB GOLD PLUS: NEGATIVE
TB1-NIL: 0 IU/mL
TB2-NIL: 0 [IU]/mL

## 2018-03-24 ENCOUNTER — Ambulatory Visit (INDEPENDENT_AMBULATORY_CARE_PROVIDER_SITE_OTHER): Payer: BC Managed Care – PPO | Admitting: Physician Assistant

## 2018-03-24 VITALS — BP 98/65 | HR 77 | Temp 98.3°F | Ht 63.0 in | Wt 253.0 lb

## 2018-03-24 DIAGNOSIS — R7303 Prediabetes: Secondary | ICD-10-CM | POA: Diagnosis not present

## 2018-03-24 DIAGNOSIS — Z6841 Body Mass Index (BMI) 40.0 and over, adult: Secondary | ICD-10-CM

## 2018-03-24 DIAGNOSIS — Z9189 Other specified personal risk factors, not elsewhere classified: Secondary | ICD-10-CM

## 2018-03-24 DIAGNOSIS — E559 Vitamin D deficiency, unspecified: Secondary | ICD-10-CM

## 2018-03-24 MED ORDER — METFORMIN HCL 500 MG PO TABS: 500.0000 mg | ORAL_TABLET | Freq: Every day | ORAL | 0 refills | Status: DC

## 2018-03-24 MED ORDER — VITAMIN D (ERGOCALCIFEROL) 1.25 MG (50000 UNIT) PO CAPS: 50000.0000 [IU] | ORAL_CAPSULE | ORAL | 0 refills | Status: DC

## 2018-03-25 NOTE — Progress Notes (Signed)
Office: [phone]  /  Fax: [phone]   HPI:   Chief Complaint: OBESITY [name] is here to discuss her progress with her obesity treatment plan. She is on the  follow the Category 3 plan and is following her eating plan approximately 95 % of the time. She states she is exercising by playing the Wii for 15-45 minutes 1 times per week. [name] is doing very well with weight loss. She reports that she is able to eat small amounts of foods she enjoys and then stop. She is enjoying the meal plan.   Her weight is 253 lb (114.8 kg) today and has had a weight loss of 9 pounds over a period of 4 weeks since her last visit. She has lost 40 lbs since starting treatment with [name].  Vitamin D deficiency Money has a diagnosis of vitamin D deficiency. She is currently taking vit D and denies nausea, vomiting or muscle weakness.  Pre-Diabetes [name] has a diagnosis of prediabetes based on her elevated HgA1c and was informed this puts her at greater risk of developing diabetes. She is taking metformin currently and continues to work on diet and exercise to decrease risk of diabetes. She denies nausea,vomiting, diarrhea, polyphagia,  or hypoglycemia.  At risk for osteopenia and osteoporosis [name] is at higher risk of osteopenia and osteoporosis due to vitamin D deficiency.    ALLERGIES: No Known Allergies  MEDICATIONS: Current Outpatient Medications on File Prior to Visit  Medication Sig Dispense Refill  . fexofenadine (ALLEGRA) 180 MG tablet Take 180 mg by mouth daily.    . InFLIXimab (REMICADE IV) IV infusion every 2 months    . letrozole (FEMARA) 2.5 MG tablet Take 1 tablet by mouth daily.    . Prenatal Vit-Fe Fumarate-FA (PRENATAL PO) Take 1 tablet by mouth daily.     No current facility-administered medications on file prior to visit.     PAST MEDICAL HISTORY: Past Medical History:  Diagnosis Date  . Abrasion of upper arm with infection, left, sequela    started antibiotics lef arm  oct 4th started antibiotics healing well  . Anemia    hx of 2 yrs ago  . Chicken pox   . Chronic rhinitis   . Contact dermatitis and other eczema, due to unspecified cause   . Crohn's 11-15-11   no issues in 2 yrs  . Fibroids 11-15-11   remains with fibroids  . Hernia   . Incisional hernia, RLQ 10/02/2011  . Infertility, female   . Mononucleosis   . Obesity   . Pilonidal abscess   . Rectal abscess   . 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 Vitamin D deficiency     PAST SURGICAL HISTORY: Past Surgical History:  Procedure Laterality Date  . APPENDECTOMY  7/06  . COLONOSCOPY WITH PROPOFOL N/A 03/07/2017   Procedure: COLONOSCOPY WITH PROPOFOL;  Surgeon: Milus Banister, MD;  Location: [facility];  Service: Endoscopy;  Laterality: N/A;  . EYE SURGERY  11-15-11   2003-multiple stys  . ileocecal resection  1/11   crohns disease with fisula/stricture. Dr. Ronnald Collum  . KNEE ARTHROSCOPY  99, 02   Bil.  [name] MYOMECTOMY N/A 11/10/2014   Procedure: MYOMECTOMY;  Surgeon: Everett Graff, MD;  Location: [facility];  Service: Gynecology;  Laterality: N/A;  . PILONIDAL CYST EXCISION  2011, 2012   I&Ds  . VENTRAL HERNIA REPAIR  11/27/2011   Procedure: LAPAROSCOPIC VENTRAL HERNIA;  Surgeon: Adin Hector, MD;  Location: [facility];  Service: General;  Laterality: N/A;  Laparoscopic Exploration & Repair of Hernia in Abdomen  . WISDOM TOOTH EXTRACTION      SOCIAL HISTORY: Social History   Tobacco Use  . Smoking status: Never Smoker  . Smokeless tobacco: Never Used  Substance Use Topics  . Alcohol use: No  . Drug use: No    FAMILY HISTORY: Family History  Problem Relation Age of Onset  . Aneurysm Mother        brain  . Heart disease Mother 40       heart anyrism  . Healthy Father        fairly  . Diabetes Sister        half sister  . Bipolar disorder Sister        half sister  . Colon cancer Maternal Grandmother   . Stomach cancer Paternal Grandfather   . Multiple sclerosis  Sister     ROS: Review of Systems  Constitutional: Positive for weight loss.  Gastrointestinal: Negative for diarrhea, nausea and vomiting.  Musculoskeletal:       Negative for muscle weakness  Endo/Heme/Allergies:       Negative for polyphagia and hypoglycemia    PHYSICAL EXAM: Blood pressure 98/65, pulse 77, temperature 98.3 F (36.8 C), temperature source Oral, height 5' 3"  (1.6 m), weight 253 lb (114.8 kg), SpO2 96 %. Body mass index is 44.82 kg/m. Physical Exam  Constitutional: She is oriented to person, place, and time. She appears well-developed and well-nourished.  HENT:  Head: Normocephalic.  Neck: Normal range of motion.  Cardiovascular: Normal rate.  Pulmonary/Chest: Effort normal.  Musculoskeletal: Normal range of motion.  Neurological: She is alert and oriented to person, place, and time.  Skin: Skin is warm and dry.  Psychiatric: She has a normal mood and affect. Her behavior is normal.  Vitals reviewed.   RECENT LABS AND TESTS: BMET    Component Value Date/Time   NA 139 01/06/2018 1122   K 4.2 01/06/2018 1122   CL 101 01/06/2018 1122   CO2 23 01/06/2018 1122   GLUCOSE 73 01/06/2018 1122   GLUCOSE 77 07/23/2017 1606   BUN 10 01/06/2018 1122   CREATININE 0.69 01/06/2018 1122   CALCIUM 8.9 01/06/2018 1122   GFRNONAA 114 01/06/2018 1122   GFRAA 131 01/06/2018 1122   Lab Results  Component Value Date   HGBA1C 5.6 01/06/2018   HGBA1C 6.0 (H) 10/02/2017   HGBA1C 6.1 07/23/2017   Lab Results  Component Value Date   INSULIN 10.1 01/06/2018   INSULIN 9.8 10/02/2017   CBC    Component Value Date/Time   WBC 13.1 (H) 10/02/2017 1245   WBC 14.4 (H) 07/23/2017 1606   RBC 4.53 10/02/2017 1245   RBC 4.42 07/23/2017 1606   HGB 11.2 10/02/2017 1245   HCT 36.9 10/02/2017 1245   PLT 412.0 (H) 07/23/2017 1606   MCV 82 10/02/2017 1245   MCH 24.7 (L) 10/02/2017 1245   MCH 29.2 11/12/2014 0536   MCHC 30.4 (L) 10/02/2017 1245   MCHC 31.6 07/23/2017 1606     RDW 17.4 (H) 10/02/2017 1245   LYMPHSABS 3.9 (H) 10/02/2017 1245   MONOABS 1.1 (H) 07/23/2017 1606   EOSABS 0.2 10/02/2017 1245   BASOSABS 0.0 10/02/2017 1245   Iron/TIBC/Ferritin/ %Sat    Component Value Date/Time   IRON 114 07/29/2013 0825   FERRITIN 14.8 07/29/2013 0825   IRONPCTSAT 36.8 07/29/2013 0825   Lipid Panel     Component Value Date/Time  CHOL 138 01/06/2018 1122   TRIG 48 01/06/2018 1122   HDL 43 01/06/2018 1122   CHOLHDL 3 07/23/2017 1606   VLDL 9.4 07/23/2017 1606   LDLCALC 85 01/06/2018 1122   Hepatic Function Panel     Component Value Date/Time   PROT 7.4 01/06/2018 1122   ALBUMIN 3.5 01/06/2018 1122   AST 15 01/06/2018 1122   ALT 11 01/06/2018 1122   ALKPHOS 97 01/06/2018 1122   BILITOT 0.2 01/06/2018 1122   BILIDIR 0.1 05/29/2016 0938      Component Value Date/Time   TSH 1.130 10/02/2017 1245   TSH 1.10 07/23/2017 1606   TSH 1.86 05/29/2016 0938    Ref. Range 01/06/2018 11:22  Vitamin D, 25-Hydroxy Latest Ref Range: 30.0 - 100.0 ng/mL 38.4   ASSESSMENT AND PLAN: Vitamin D deficiency - Plan: Vitamin D, Ergocalciferol, (DRISDOL) 50000 units CAPS capsule  Prediabetes - Plan: metFORMIN (GLUCOPHAGE) 500 MG tablet  At risk for osteoporosis  Class 3 severe obesity with serious comorbidity and body mass index (BMI) of 40.0 to 44.9 in adult, unspecified obesity type ([place])  PLAN: Vitamin D Deficiency Karizma was informed that low vitamin D levels contributes to fatigue and are associated with obesity, breast, and colon cancer. She agrees to continue to take prescription Vit D @50 ,000 IU every week #4 with no refills and will follow up for routine testing of vitamin D, at least 2-3 times per year. She was informed of the risk of over-replacement of vitamin D and agrees to not increase her dose unless she discusses this with [name] first. Agrees to follow up with our clinic as directed.   Pre-Diabetes Geniene will continue to work on weight loss, exercise,  and decreasing simple carbohydrates in her diet to help decrease the risk of diabetes. We dicussed metformin including benefits and risks. She was informed that eating too many simple carbohydrates or too many calories at one sitting increases the likelihood of GI side effects. Graceland agrees to continue Metformin 500 mg daily #30 with no refills. Jaira agreed to follow up with [name] as directed to monitor her progress.  At risk for osteopenia and osteoporosis Yasmine was given extended  (15 minutes) osteoporosis prevention counseling today. Melissa is at risk for osteopenia and osteoporosis due to her vitamin D deficiency. She was encouraged to take her vitamin D and follow her higher calcium diet and increase strengthening exercise to help strengthen her bones and decrease her risk of osteopenia and osteoporosis.  Obesity Neila is currently in the action stage of change. As such, her goal is to continue with weight loss efforts She has agreed to follow the Category 3 plan Emmaclaire has been instructed to work up to a goal of 150 minutes of combined cardio and strengthening exercise per week for weight loss and overall health benefits. We discussed the following Behavioral Modification Strategies today: work on meal planning and easy cooking plans and planning for success.   Makinna has agreed to follow up with our clinic in 3 weeks. She was informed of the importance of frequent follow up visits to maximize her success with intensive lifestyle modifications for her multiple health conditions.   OBESITY BEHAVIORAL INTERVENTION VISIT  Today's visit was # 9   Starting weight: 293 lb Starting date: 10/02/17 Today's weight : 253 lb Today's date: 03/24/18 Total lbs lost to date: 40 lb    ASK: We discussed the diagnosis of obesity with Standley Dakins today and Raziya agreed to give [name] permission to  discuss obesity behavioral modification therapy today.  ASSESS: Illianna has the diagnosis of obesity and  her BMI today is 44.83.  Athina is in the action stage of change   ADVISE: Ilse was educated on the multiple health risks of obesity as well as the benefit of weight loss to improve her health. She was advised of the need for long term treatment and the importance of lifestyle modifications to improve her current health and to decrease her risk of future health problems.  AGREE: Multiple dietary modification options and treatment options were discussed and  Nakea agreed to follow the recommendations documented in the above note.  ARRANGE: Anzley was educated on the importance of frequent visits to treat obesity as outlined per CMS and USPSTF guidelines and agreed to schedule her next follow up appointment today.  Leary Roca, am acting as transcriptionist for Abby Potash, PA-C  I, Abby Potash, PA-C have reviewed above note and agree with its content

## 2018-03-29 ENCOUNTER — Other Ambulatory Visit (HOSPITAL_COMMUNITY)
Admission: RE | Admit: 2018-03-29 | Discharge: 2018-03-29 | Disposition: A | Discharge status: Home / Self Care | Payer: BC Managed Care – PPO | Source: Ambulatory Visit | Attending: Obstetrics and Gynecology | Admitting: Obstetrics and Gynecology

## 2018-03-29 DIAGNOSIS — N979 Female infertility, unspecified: Secondary | ICD-10-CM | POA: Diagnosis present

## 2018-03-30 LAB — PROGESTERONE: Progesterone: 18.6 ng/mL

## 2018-04-15 ENCOUNTER — Encounter (INDEPENDENT_AMBULATORY_CARE_PROVIDER_SITE_OTHER): Payer: Self-pay | Admitting: Physician Assistant

## 2018-04-15 ENCOUNTER — Ambulatory Visit (INDEPENDENT_AMBULATORY_CARE_PROVIDER_SITE_OTHER): Payer: BC Managed Care – PPO | Admitting: Physician Assistant

## 2018-04-15 VITALS — BP 105/69 | HR 87 | Temp 98.5°F | Ht 63.0 in | Wt 245.0 lb

## 2018-04-15 DIAGNOSIS — R7303 Prediabetes: Secondary | ICD-10-CM

## 2018-04-15 DIAGNOSIS — Z9189 Other specified personal risk factors, not elsewhere classified: Secondary | ICD-10-CM | POA: Diagnosis not present

## 2018-04-15 DIAGNOSIS — E559 Vitamin D deficiency, unspecified: Secondary | ICD-10-CM | POA: Diagnosis not present

## 2018-04-15 DIAGNOSIS — Z6841 Body Mass Index (BMI) 40.0 and over, adult: Secondary | ICD-10-CM

## 2018-04-15 MED ORDER — METFORMIN HCL 500 MG PO TABS: 500.0000 mg | ORAL_TABLET | Freq: Every day | ORAL | 0 refills | Status: DC

## 2018-04-15 MED ORDER — VITAMIN D (ERGOCALCIFEROL) 1.25 MG (50000 UNIT) PO CAPS: 50000.0000 [IU] | ORAL_CAPSULE | ORAL | 0 refills | Status: DC

## 2018-04-17 NOTE — Progress Notes (Signed)
Office: [phone]  /  Fax: [phone]   HPI:   Chief Complaint: OBESITY [name] is here to discuss her progress with her obesity treatment plan. She is on the Category 3 plan and is following her eating plan approximately 99 % of the time. She states she is exercising 0 minutes 0 times per week. [name] did very well with weight loss. She struggled to get all of her food in due to an illness, but she has been successful most days.  Her weight is 245 lb (111.1 kg) today and has had a weight loss of 8 pounds over a period of 3 weeks since her last visit. She has lost 48 lbs since starting treatment with [name].  Vitamin D Deficiency [name] has a diagnosis of vitamin D deficiency. She is on prescription Vit D and denies nausea, vomiting or muscle weakness.  Pre-Diabetes [name] has a diagnosis of pre-diabetes based on her elevated Hgb A1c and was informed this puts her at greater risk of developing diabetes. She is on metformin and denies nausea, vomiting, or diarrhea. She continues to work on diet and exercise to decrease risk of diabetes. She denies polyphagia or hypoglycemia.  At risk for diabetes [name] is at higher than average risk for developing diabetes due to her obesity and pre-diabetes. She currently denies polyuria or polydipsia.  ALLERGIES: No Known Allergies  MEDICATIONS: Current Outpatient Medications on File Prior to Visit  Medication Sig Dispense Refill  . fexofenadine (ALLEGRA) 180 MG tablet Take 180 mg by mouth daily.    . InFLIXimab (REMICADE IV) IV infusion every 2 months    . letrozole (FEMARA) 2.5 MG tablet Take 1 tablet by mouth daily.    . Prenatal Vit-Fe Fumarate-FA (PRENATAL PO) Take 1 tablet by mouth daily.     No current facility-administered medications on file prior to visit.     PAST MEDICAL HISTORY: Past Medical History:  Diagnosis Date  . Abrasion of upper arm with infection, left, sequela    started antibiotics lef arm oct 4th started antibiotics  healing well  . Anemia    hx of 2 yrs ago  . Chicken pox   . Chronic rhinitis   . Contact dermatitis and other eczema, due to unspecified cause   . Crohn's 11-15-11   no issues in 2 yrs  . Fibroids 11-15-11   remains with fibroids  . Hernia   . Incisional hernia, RLQ 10/02/2011  . Infertility, female   . Mononucleosis   . Obesity   . Pilonidal abscess   . Rectal abscess   . 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 Vitamin D deficiency     PAST SURGICAL HISTORY: Past Surgical History:  Procedure Laterality Date  . APPENDECTOMY  7/06  . COLONOSCOPY WITH PROPOFOL N/A 03/07/2017   Procedure: COLONOSCOPY WITH PROPOFOL;  Surgeon: Milus Banister, MD;  Location: [facility];  Service: Endoscopy;  Laterality: N/A;  . EYE SURGERY  11-15-11   2003-multiple stys  . ileocecal resection  1/11   crohns disease with fisula/stricture. Dr. Ronnald Collum  . KNEE ARTHROSCOPY  99, 02   Bil.  [name] MYOMECTOMY N/A 11/10/2014   Procedure: MYOMECTOMY;  Surgeon: Everett Graff, MD;  Location: [facility];  Service: Gynecology;  Laterality: N/A;  . PILONIDAL CYST EXCISION  2011, 2012   I&Ds  . VENTRAL HERNIA REPAIR  11/27/2011   Procedure: LAPAROSCOPIC VENTRAL HERNIA;  Surgeon: Adin Hector, MD;  Location: [facility];  Service: General;  Laterality:  N/A;  Laparoscopic Exploration & Repair of Hernia in Abdomen  . WISDOM TOOTH EXTRACTION      SOCIAL HISTORY: Social History   Tobacco Use  . Smoking status: Never Smoker  . Smokeless tobacco: Never Used  Substance Use Topics  . Alcohol use: No  . Drug use: No    FAMILY HISTORY: Family History  Problem Relation Age of Onset  . Aneurysm Mother        brain  . Heart disease Mother 10       heart anyrism  . Healthy Father        fairly  . Diabetes Sister        half sister  . Bipolar disorder Sister        half sister  . Colon cancer Maternal Grandmother   . Stomach cancer Paternal Grandfather   . Multiple sclerosis Sister     ROS: Review  of Systems  Constitutional: Positive for weight loss.  Gastrointestinal: Negative for diarrhea, nausea and vomiting.  Genitourinary: Negative for frequency.  Musculoskeletal:       Negative muscle weakness  Endo/Heme/Allergies: Negative for polydipsia.       Negative polyphagia Negative hypoglycemia    PHYSICAL EXAM: Blood pressure 105/69, pulse 87, temperature 98.5 F (36.9 C), temperature source Oral, height 5' 3"  (1.6 m), weight 245 lb (111.1 kg), SpO2 98 %. Body mass index is 43.4 kg/m. Physical Exam  Constitutional: She is oriented to person, place, and time. She appears well-developed and well-nourished.  Cardiovascular: Normal rate.  Pulmonary/Chest: Effort normal.  Musculoskeletal: Normal range of motion.  Neurological: She is oriented to person, place, and time.  Skin: Skin is warm and dry.  Psychiatric: She has a normal mood and affect. Her behavior is normal.  Vitals reviewed.   RECENT LABS AND TESTS: BMET    Component Value Date/Time   NA 139 01/06/2018 1122   K 4.2 01/06/2018 1122   CL 101 01/06/2018 1122   CO2 23 01/06/2018 1122   GLUCOSE 73 01/06/2018 1122   GLUCOSE 77 07/23/2017 1606   BUN 10 01/06/2018 1122   CREATININE 0.69 01/06/2018 1122   CALCIUM 8.9 01/06/2018 1122   GFRNONAA 114 01/06/2018 1122   GFRAA 131 01/06/2018 1122   Lab Results  Component Value Date   HGBA1C 5.6 01/06/2018   HGBA1C 6.0 (H) 10/02/2017   HGBA1C 6.1 07/23/2017   Lab Results  Component Value Date   INSULIN 10.1 01/06/2018   INSULIN 9.8 10/02/2017   CBC    Component Value Date/Time   WBC 13.1 (H) 10/02/2017 1245   WBC 14.4 (H) 07/23/2017 1606   RBC 4.53 10/02/2017 1245   RBC 4.42 07/23/2017 1606   HGB 11.2 10/02/2017 1245   HCT 36.9 10/02/2017 1245   PLT 412.0 (H) 07/23/2017 1606   MCV 82 10/02/2017 1245   MCH 24.7 (L) 10/02/2017 1245   MCH 29.2 11/12/2014 0536   MCHC 30.4 (L) 10/02/2017 1245   MCHC 31.6 07/23/2017 1606   RDW 17.4 (H) 10/02/2017 1245    LYMPHSABS 3.9 (H) 10/02/2017 1245   MONOABS 1.1 (H) 07/23/2017 1606   EOSABS 0.2 10/02/2017 1245   BASOSABS 0.0 10/02/2017 1245   Iron/TIBC/Ferritin/ %Sat    Component Value Date/Time   IRON 114 07/29/2013 0825   FERRITIN 14.8 07/29/2013 0825   IRONPCTSAT 36.8 07/29/2013 0825   Lipid Panel     Component Value Date/Time   CHOL 138 01/06/2018 1122   TRIG 48 01/06/2018 1122  HDL 43 01/06/2018 1122   CHOLHDL 3 07/23/2017 1606   VLDL 9.4 07/23/2017 1606   LDLCALC 85 01/06/2018 1122   Hepatic Function Panel     Component Value Date/Time   PROT 7.4 01/06/2018 1122   ALBUMIN 3.5 01/06/2018 1122   AST 15 01/06/2018 1122   ALT 11 01/06/2018 1122   ALKPHOS 97 01/06/2018 1122   BILITOT 0.2 01/06/2018 1122   BILIDIR 0.1 05/29/2016 0938      Component Value Date/Time   TSH 1.130 10/02/2017 1245   TSH 1.10 07/23/2017 1606   TSH 1.86 05/29/2016 0938  Results for MALISSIE, MUSGRAVE (MRN [id number]) as of 04/17/2018 16:54  Ref. Range 01/06/2018 11:22  Vitamin D, 25-Hydroxy Latest Ref Range: 30.0 - 100.0 ng/mL 38.4    ASSESSMENT AND PLAN: Vitamin D deficiency - Plan: Vitamin D, Ergocalciferol, (DRISDOL) 1.25 MG (50000 UT) CAPS capsule  Prediabetes - Plan: metFORMIN (GLUCOPHAGE) 500 MG tablet  At risk for diabetes mellitus  Class 3 severe obesity with serious comorbidity and body mass index (BMI) of 40.0 to 44.9 in adult, unspecified obesity type ([place])  PLAN:  Vitamin D Deficiency Hatsumi was informed that low vitamin D levels contributes to fatigue and are associated with obesity, breast, and colon cancer. Lydiann agrees to continue taking prescription Vit D @50 ,000 IU every week #4 and we will refill for 1 month. She will follow up for routine testing of vitamin D, at least 2-3 times per year. She was informed of the risk of over-replacement of vitamin D and agrees to not increase her dose unless she discusses this with [name] first. We will recheck labs at next visit. Wendelin agrees to  follow up with our clinic in 3 weeks.  Pre-Diabetes Angelamarie will continue to work on weight loss, exercise, and decreasing simple carbohydrates in her diet to help decrease the risk of diabetes. We dicussed metformin including benefits and risks. She was informed that eating too many simple carbohydrates or too many calories at one sitting increases the likelihood of GI side effects. Anum agrees to continue taking metformin 500 mg q AM #30 and we will refill for 1 month. We will recheck labs at next visit. Irisha agrees to follow up with our clinic in 3 weeks as directed to monitor her progress.  Diabetes risk counselling Vyla was given extended (15 minutes) diabetes prevention counseling today. She is 37 y.o. female and has risk factors for diabetes including obesity and pre-diabetes. We discussed intensive lifestyle modifications today with an emphasis on weight loss as well as increasing exercise and decreasing simple carbohydrates in her diet.  Obesity Laurin is currently in the action stage of change. As such, her goal is to continue with weight loss efforts She has agreed to follow the Category 3 plan Margalit has been instructed to work up to a goal of 150 minutes of combined cardio and strengthening exercise per week for weight loss and overall health benefits. We discussed the following Behavioral Modification Strategies today: work on meal planning and easy cooking plans and holiday eating strategies    Alvia has agreed to follow up with our clinic in 3 weeks. She was informed of the importance of frequent follow up visits to maximize her success with intensive lifestyle modifications for her multiple health conditions.   OBESITY BEHAVIORAL INTERVENTION VISIT  Today's visit was # 10   Starting weight: 293 lbs Starting date: 10/02/17 Today's weight : 245 lbs Today's date: 04/15/2018 Total lbs lost to date: 107  ASK: We discussed the diagnosis of obesity with Standley Dakins  today and Charita agreed to give [name] permission to discuss obesity behavioral modification therapy today.  ASSESS: Irais has the diagnosis of obesity and her BMI today is 43.41 Larene is in the action stage of change   ADVISE: Haillee was educated on the multiple health risks of obesity as well as the benefit of weight loss to improve her health. She was advised of the need for long term treatment and the importance of lifestyle modifications.  AGREE: Multiple dietary modification options and treatment options were discussed and  Cason agreed to the above obesity treatment plan.  Wilhemena Durie, am acting as transcriptionist for Abby Potash, PA-C I, Abby Potash, PA-C have reviewed above note and agree with its content

## 2018-05-08 ENCOUNTER — Ambulatory Visit (INDEPENDENT_AMBULATORY_CARE_PROVIDER_SITE_OTHER): Payer: BC Managed Care – PPO | Admitting: Physician Assistant

## 2018-05-08 ENCOUNTER — Encounter (INDEPENDENT_AMBULATORY_CARE_PROVIDER_SITE_OTHER): Payer: Self-pay | Admitting: Physician Assistant

## 2018-05-08 VITALS — BP 125/71 | HR 91 | Temp 98.2°F | Ht 63.0 in | Wt 246.0 lb

## 2018-05-08 DIAGNOSIS — E7849 Other hyperlipidemia: Secondary | ICD-10-CM

## 2018-05-08 DIAGNOSIS — Z9189 Other specified personal risk factors, not elsewhere classified: Secondary | ICD-10-CM | POA: Diagnosis not present

## 2018-05-08 DIAGNOSIS — Z6841 Body Mass Index (BMI) 40.0 and over, adult: Secondary | ICD-10-CM

## 2018-05-08 DIAGNOSIS — R7303 Prediabetes: Secondary | ICD-10-CM

## 2018-05-08 DIAGNOSIS — E559 Vitamin D deficiency, unspecified: Secondary | ICD-10-CM | POA: Diagnosis not present

## 2018-05-08 NOTE — Progress Notes (Signed)
Office: [phone]  /  Fax: [phone]   HPI:   Chief Complaint: OBESITY [name] is here to discuss her progress with her obesity treatment plan. She is on the Category 3 plan and is following her eating plan approximately 95 % of the time. She states she is exercising 0 minutes 0 times per week. Hadas reports that she had a week when she was not getting all of her food in, due to not feeling well. She had a better week this past week and she is feeling ready to get back on track.  Her weight is 246 lb (111.6 kg) today and has gained 1 pound since her last visit. She has lost 47 lbs since starting treatment with [name].  Hyperlipidemia Salli has hyperlipidemia and has been trying to improve her cholesterol levels with intensive lifestyle modification including a low saturated fat diet, exercise and weight loss. She is not on medications and denies any chest pain, claudication or myalgias.  At risk for cardiovascular disease Makalyn is at a higher than average risk for cardiovascular disease due to obesity and hyperlipidemia. She currently denies any chest pain.  Vitamin D Deficiency Brendi has a diagnosis of vitamin D deficiency. She is currently taking prescription Vit D and denies nausea, vomiting or muscle weakness.  Pre-Diabetes Almee has a diagnosis of pre-diabetes based on her elevated Hgb A1c and was informed this puts her at greater risk of developing diabetes. She is on metformin and denies nausea, vomiting, or diarrhea. She continues to work on diet and exercise to decrease risk of diabetes. She denies polyphagia or hypoglycemia.  ALLERGIES: No Known Allergies  MEDICATIONS: Current Outpatient Medications on File Prior to Visit  Medication Sig Dispense Refill  . fexofenadine (ALLEGRA) 180 MG tablet Take 180 mg by mouth daily.    . InFLIXimab (REMICADE IV) IV infusion every 2 months    . letrozole (FEMARA) 2.5 MG tablet Take 1 tablet by mouth daily.    . metFORMIN  (GLUCOPHAGE) 500 MG tablet Take 1 tablet (500 mg total) by mouth daily with breakfast. 30 tablet 0  . Prenatal Vit-Fe Fumarate-FA (PRENATAL PO) Take 1 tablet by mouth daily.    . Vitamin D, Ergocalciferol, (DRISDOL) 1.25 MG (50000 UT) CAPS capsule Take 1 capsule (50,000 Units total) by mouth every 7 (seven) days. 4 capsule 0   No current facility-administered medications on file prior to visit.     PAST MEDICAL HISTORY: Past Medical History:  Diagnosis Date  . Abrasion of upper arm with infection, left, sequela    started antibiotics lef arm oct 4th started antibiotics healing well  . Anemia    hx of 2 yrs ago  . Chicken pox   . Chronic rhinitis   . Contact dermatitis and other eczema, due to unspecified cause   . Crohn's 11-15-11   no issues in 2 yrs  . Fibroids 11-15-11   remains with fibroids  . Hernia   . Incisional hernia, RLQ 10/02/2011  . Infertility, female   . Mononucleosis   . Obesity   . Pilonidal abscess   . Rectal abscess   . 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 Vitamin D deficiency     PAST SURGICAL HISTORY: Past Surgical History:  Procedure Laterality Date  . APPENDECTOMY  7/06  . COLONOSCOPY WITH PROPOFOL N/A 03/07/2017   Procedure: COLONOSCOPY WITH PROPOFOL;  Surgeon: Milus Banister, MD;  Location: [facility];  Service: Endoscopy;  Laterality: N/A;  . EYE SURGERY  11-15-11   2003-multiple stys  . ileocecal resection  1/11   crohns disease with fisula/stricture. Dr. Ronnald Collum  . KNEE ARTHROSCOPY  99, 02   Bil.  [name] MYOMECTOMY N/A 11/10/2014   Procedure: MYOMECTOMY;  Surgeon: Everett Graff, MD;  Location: [facility];  Service: Gynecology;  Laterality: N/A;  . PILONIDAL CYST EXCISION  2011, 2012   I&Ds  . VENTRAL HERNIA REPAIR  11/27/2011   Procedure: LAPAROSCOPIC VENTRAL HERNIA;  Surgeon: Adin Hector, MD;  Location: [facility];  Service: General;  Laterality: N/A;  Laparoscopic Exploration & Repair of Hernia in Abdomen  . WISDOM TOOTH EXTRACTION        SOCIAL HISTORY: Social History   Tobacco Use  . Smoking status: Never Smoker  . Smokeless tobacco: Never Used  Substance Use Topics  . Alcohol use: No  . Drug use: No    FAMILY HISTORY: Family History  Problem Relation Age of Onset  . Aneurysm Mother        brain  . Heart disease Mother 6       heart anyrism  . Healthy Father        fairly  . Diabetes Sister        half sister  . Bipolar disorder Sister        half sister  . Colon cancer Maternal Grandmother   . Stomach cancer Paternal Grandfather   . Multiple sclerosis Sister     ROS: Review of Systems  Constitutional: Negative for weight loss.  Cardiovascular: Negative for chest pain and claudication.  Gastrointestinal: Negative for diarrhea, nausea and vomiting.  Musculoskeletal: Negative for myalgias.       Negative muscle weakness  Endo/Heme/Allergies:       Negative Polyphagia Negative hypoglycemia    PHYSICAL EXAM: Blood pressure 125/71, pulse 91, temperature 98.2 F (36.8 C), temperature source Oral, height 5' 3"  (1.6 m), weight 246 lb (111.6 kg), last menstrual period 05/07/2018, SpO2 92 %. Body mass index is 43.58 kg/m. Physical Exam Vitals signs reviewed.  Constitutional:      Appearance: Normal appearance. She is obese.  Cardiovascular:     Rate and Rhythm: Normal rate.  Pulmonary:     Effort: Pulmonary effort is normal.  Musculoskeletal: Normal range of motion.  Skin:    General: Skin is warm and dry.  Neurological:     Mental Status: She is alert and oriented to person, place, and time.  Psychiatric:        Mood and Affect: Mood normal.        Behavior: Behavior normal.     RECENT LABS AND TESTS: BMET    Component Value Date/Time   NA 139 01/06/2018 1122   K 4.2 01/06/2018 1122   CL 101 01/06/2018 1122   CO2 23 01/06/2018 1122   GLUCOSE 73 01/06/2018 1122   GLUCOSE 77 07/23/2017 1606   BUN 10 01/06/2018 1122   CREATININE 0.69 01/06/2018 1122   CALCIUM 8.9 01/06/2018  1122   GFRNONAA 114 01/06/2018 1122   GFRAA 131 01/06/2018 1122   Lab Results  Component Value Date   HGBA1C 5.6 01/06/2018   HGBA1C 6.0 (H) 10/02/2017   HGBA1C 6.1 07/23/2017   Lab Results  Component Value Date   INSULIN 10.1 01/06/2018   INSULIN 9.8 10/02/2017   CBC    Component Value Date/Time   WBC 13.1 (H) 10/02/2017 1245   WBC 14.4 (H) 07/23/2017 1606   RBC 4.53 10/02/2017 1245   RBC 4.42 07/23/2017  1606   HGB 11.2 10/02/2017 1245   HCT 36.9 10/02/2017 1245   PLT 412.0 (H) 07/23/2017 1606   MCV 82 10/02/2017 1245   MCH 24.7 (L) 10/02/2017 1245   MCH 29.2 11/12/2014 0536   MCHC 30.4 (L) 10/02/2017 1245   MCHC 31.6 07/23/2017 1606   RDW 17.4 (H) 10/02/2017 1245   LYMPHSABS 3.9 (H) 10/02/2017 1245   MONOABS 1.1 (H) 07/23/2017 1606   EOSABS 0.2 10/02/2017 1245   BASOSABS 0.0 10/02/2017 1245   Iron/TIBC/Ferritin/ %Sat    Component Value Date/Time   IRON 114 07/29/2013 0825   FERRITIN 14.8 07/29/2013 0825   IRONPCTSAT 36.8 07/29/2013 0825   Lipid Panel     Component Value Date/Time   CHOL 138 01/06/2018 1122   TRIG 48 01/06/2018 1122   HDL 43 01/06/2018 1122   CHOLHDL 3 07/23/2017 1606   VLDL 9.4 07/23/2017 1606   LDLCALC 85 01/06/2018 1122   Hepatic Function Panel     Component Value Date/Time   PROT 7.4 01/06/2018 1122   ALBUMIN 3.5 01/06/2018 1122   AST 15 01/06/2018 1122   ALT 11 01/06/2018 1122   ALKPHOS 97 01/06/2018 1122   BILITOT 0.2 01/06/2018 1122   BILIDIR 0.1 05/29/2016 0938      Component Value Date/Time   TSH 1.130 10/02/2017 1245   TSH 1.10 07/23/2017 1606   TSH 1.86 05/29/2016 0938   Results for MIKESHA, MIGLIACCIO (MRN [id number]) as of 05/08/2018 11:55  Ref. Range 01/06/2018 11:22  Vitamin D, 25-Hydroxy Latest Ref Range: 30.0 - 100.0 ng/mL 38.4   ASSESSMENT AND PLAN: Other hyperlipidemia - Plan: Lipid Panel With LDL/HDL Ratio  Vitamin D deficiency - Plan: VITAMIN D 25 Hydroxy (Vit-D Deficiency, Fractures)  Prediabetes - Plan:  Comprehensive metabolic panel, Hemoglobin A1c, Insulin, random  At risk for heart disease  Class 3 severe obesity with serious comorbidity and body mass index (BMI) of 40.0 to 44.9 in adult, unspecified obesity type (HCC)  PLAN:  Hyperlipidemia Donnalyn was informed of the American Heart Association Guidelines emphasizing intensive lifestyle modifications as the first line treatment for hyperlipidemia. We discussed many lifestyle modifications today in depth, and Cherron will continue to work on decreasing saturated fats such as fatty red meat, butter and many fried foods. She will also increase vegetables and lean protein in her diet and continue to work on diet, exercise, and weight loss efforts. We will check labs and Codee agrees to follow up with our clinic in 3 to 4 weeks.  Cardiovascular risk counselling Skie was given extended (15 minutes) coronary artery disease prevention counseling today. She is 37 y.o. female and has risk factors for heart disease including obesity and hyperlipidemia. We discussed intensive lifestyle modifications today with an emphasis on specific weight loss instructions and strategies. Pt was also informed of the importance of increasing exercise and decreasing saturated fats to help prevent heart disease.  Vitamin D Deficiency Alisia was informed that low vitamin D levels contributes to fatigue and are associated with obesity, breast, and colon cancer. Naomi agrees to continue taking prescription Vit D @50 ,000 IU every week and will follow up for routine testing of vitamin D, at least 2-3 times per year. She was informed of the risk of over-replacement of vitamin D and agrees to not increase her dose unless she discusses this with [name] first. We will check labs and Daissy agrees to follow up in 3 to 4 weeks.  Pre-Diabetes Labresha will continue to work on weight loss, diet, exercise,  and decreasing simple carbohydrates in her diet to help decrease the risk of diabetes.  We dicussed metformin including benefits and risks. She was informed that eating too many simple carbohydrates or too many calories at one sitting increases the likelihood of GI side effects. Daleyssa agrees to continue taking metformin and we will check labs today. Kaylyn agrees to follow up with our clinic in 3 to 4 weeks as directed to monitor her progress.  Obesity Amalya is currently in the action stage of change. As such, her goal is to continue with weight loss efforts She has agreed to follow the Category 3 plan Theresea has been instructed to work up to a goal of 150 minutes of combined cardio and strengthening exercise per week for weight loss and overall health benefits. We discussed the following Behavioral Modification Strategies today: work on meal planning and easy cooking plans and holiday eating strategies    Tzipporah has agreed to follow up with our clinic in 3 to 4 weeks. She was informed of the importance of frequent follow up visits to maximize her success with intensive lifestyle modifications for her multiple health conditions.   OBESITY BEHAVIORAL INTERVENTION VISIT  Today's visit was # 11   Starting weight: 293 lbs Starting date: 10/02/17 Today's weight : 246 lbs Today's date: 05/08/2018 Total lbs lost to date: 55     ASK: We discussed the diagnosis of obesity with Standley Dakins today and Chennel agreed to give [name] permission to discuss obesity behavioral modification therapy today.  ASSESS: Robbyn has the diagnosis of obesity and her BMI today is 43.59 Anniece is in the action stage of change   ADVISE: Shailyn was educated on the multiple health risks of obesity as well as the benefit of weight loss to improve her health. She was advised of the need for long term treatment and the importance of lifestyle modifications.  AGREE: Multiple dietary modification options and treatment options were discussed and  Min agreed to the above obesity treatment plan.  I, Tammy  Wysor, am acting as Location manager for [employer], PA-C I, Abby Potash, PA-C have reviewed above note and agree with its content

## 2018-05-09 LAB — COMPREHENSIVE METABOLIC PANEL
ALT: 11 IU/L (ref 0–32)
AST: 13 IU/L (ref 0–40)
Albumin/Globulin Ratio: 1.1 — ABNORMAL LOW (ref 1.2–2.2)
Albumin: 3.4 g/dL — ABNORMAL LOW (ref 3.5–5.5)
Alkaline Phosphatase: 79 IU/L (ref 39–117)
BUN/Creatinine Ratio: 23 (ref 9–23)
BUN: 15 mg/dL (ref 6–20)
Bilirubin Total: 0.2 mg/dL (ref 0.0–1.2)
CHLORIDE: 102 mmol/L (ref 96–106)
CO2: 22 mmol/L (ref 20–29)
Calcium: 9 mg/dL (ref 8.7–10.2)
Creatinine, Ser: 0.64 mg/dL (ref 0.57–1.00)
GFR calc Af Amer: 134 mL/min/{1.73_m2} (ref >59)
GFR calc non Af Amer: 116 mL/min/{1.73_m2} (ref >59)
Globulin, Total: 3.1 g/dL (ref 1.5–4.5)
Glucose: 82 mg/dL (ref 65–99)
Potassium: 4.1 mmol/L (ref 3.5–5.2)
Sodium: 138 mmol/L (ref 134–144)
Total Protein: 6.5 g/dL (ref 6.0–8.5)

## 2018-05-09 LAB — LIPID PANEL WITH LDL/HDL RATIO
Cholesterol, Total: 150 mg/dL (ref 100–199)
HDL: 42 mg/dL (ref >39)
LDL Calculated: 100 mg/dL — ABNORMAL HIGH (ref 0–99)
LDl/HDL Ratio: 2.4 ratio (ref 0.0–3.2)
Triglycerides: 42 mg/dL (ref 0–149)
VLDL Cholesterol Cal: 8 mg/dL (ref 5–40)

## 2018-05-09 LAB — HEMOGLOBIN A1C
Est. average glucose Bld gHb Est-mCnc: 117 mg/dL
Hgb A1c MFr Bld: 5.7 % — ABNORMAL HIGH (ref 4.8–5.6)

## 2018-05-09 LAB — SPECIMEN STATUS REPORT

## 2018-05-09 LAB — VITAMIN D 25 HYDROXY (VIT D DEFICIENCY, FRACTURES): Vit D, 25-Hydroxy: 49.5 ng/mL (ref 30.0–100.0)

## 2018-05-09 LAB — INSULIN, RANDOM: INSULIN: 3.5 u[IU]/mL (ref 2.6–24.9)

## 2018-06-03 ENCOUNTER — Other Ambulatory Visit: Payer: Self-pay | Admitting: Obstetrics and Gynecology

## 2018-06-03 ENCOUNTER — Other Ambulatory Visit (HOSPITAL_COMMUNITY): Payer: Self-pay | Admitting: Obstetrics and Gynecology

## 2018-06-03 DIAGNOSIS — N979 Female infertility, unspecified: Secondary | ICD-10-CM

## 2018-06-05 ENCOUNTER — Ambulatory Visit (INDEPENDENT_AMBULATORY_CARE_PROVIDER_SITE_OTHER): Payer: BC Managed Care – PPO | Admitting: Physician Assistant

## 2018-06-05 ENCOUNTER — Encounter (INDEPENDENT_AMBULATORY_CARE_PROVIDER_SITE_OTHER): Payer: Self-pay | Admitting: Physician Assistant

## 2018-06-05 VITALS — BP 106/68 | HR 86 | Temp 97.9°F | Ht 63.0 in | Wt 241.0 lb

## 2018-06-05 DIAGNOSIS — Z6841 Body Mass Index (BMI) 40.0 and over, adult: Secondary | ICD-10-CM

## 2018-06-05 DIAGNOSIS — E559 Vitamin D deficiency, unspecified: Secondary | ICD-10-CM | POA: Diagnosis not present

## 2018-06-05 DIAGNOSIS — R7303 Prediabetes: Secondary | ICD-10-CM

## 2018-06-05 DIAGNOSIS — Z9189 Other specified personal risk factors, not elsewhere classified: Secondary | ICD-10-CM

## 2018-06-05 MED ORDER — VITAMIN D (ERGOCALCIFEROL) 1.25 MG (50000 UNIT) PO CAPS: 50000.0000 [IU] | ORAL_CAPSULE | ORAL | 0 refills | Status: DC

## 2018-06-05 MED ORDER — METFORMIN HCL 500 MG PO TABS: 500.0000 mg | ORAL_TABLET | Freq: Every day | ORAL | 0 refills | Status: DC

## 2018-06-05 NOTE — Progress Notes (Signed)
Office: [phone]  /  Fax: [phone]   HPI:   Chief Complaint: OBESITY Aracelli is here to discuss her progress with her obesity treatment plan. She is on the Category 3 plan and is following her eating plan approximately 97 % of the time. She states she is dancing 10 minutes 1 times per week. Jatasia did very well with weight loss. She reports that she is no longer sick and has been able to get all of her food in on the plan. Her weight is 241 lb (109.3 kg) today and has had a weight loss of 5 pounds over a period of 4 weeks since her last visit. She has lost 52 lbs since starting treatment with [name].  Vitamin D deficiency Ren has a diagnosis of vitamin D deficiency. She is currently taking prescription Vit D and denies nausea, vomiting or muscle weakness.  Pre-Diabetes Anthonia has a diagnosis of prediabetes based on her elevated Hgb A1c and was informed this puts her at greater risk of developing diabetes. She is taking metformin currently and continues to work on diet and exercise to decrease risk of diabetes. She denies nausea, vomiting or diarrhea. She denies polyphagia or hypoglycemia.  At risk for diabetes Rhapsody is at higher than average risk for developing diabetes due to her obesity. She currently denies polyuria or polydipsia.  ASSESSMENT AND PLAN:  Vitamin D deficiency - Plan: Vitamin D, Ergocalciferol, (DRISDOL) 1.25 MG (50000 UT) CAPS capsule  Prediabetes - Plan: metFORMIN (GLUCOPHAGE) 500 MG tablet  At risk for diabetes mellitus  Class 3 severe obesity with serious comorbidity and body mass index (BMI) of 40.0 to 44.9 in adult, unspecified obesity type ([place])  PLAN:  Vitamin D Deficiency Marieli was informed that low vitamin D levels contributes to fatigue and are associated with obesity, breast, and colon cancer. She agrees to continue to take prescription Vit D 50,000 IU every other week #2 with no refills and will follow up for routine testing of vitamin D, at  least 2-3 times per year. She was informed of the risk of over-replacement of vitamin D and agrees to not increase her dose unless she discusses this with [name] first. Asli agrees to follow up with our clinic in 3 weeks.  Pre-Diabetes Therasa will continue to work on weight loss, exercise, and decreasing simple carbohydrates in her diet to help decrease the risk of diabetes. We dicussed metformin including benefits and risks. She was informed that eating too many simple carbohydrates or too many calories at one sitting increases the likelihood of GI side effects. Isley is taking metformin 500 mg qd with breakfast #30 with no refills for now and a prescription was written today. Meghna agreed to follow up with [name] as directed to monitor her progress. Randalyn agrees to follow up with our clinic in 3 weeks.  Diabetes risk counseling Amparo was given extended (15 minutes) diabetes prevention counseling today. She is 38 y.o. female and has risk factors for diabetes including obesity. We discussed intensive lifestyle modifications today with an emphasis on weight loss as well as increasing exercise and decreasing simple carbohydrates in her diet.  Obesity Koni is currently in the action stage of change. As such, her goal is to continue with weight loss efforts She has agreed to follow the Category 3 plan Shalunda has been instructed to work up to a goal of 150 minutes of combined cardio and strengthening exercise per week for weight loss and overall health benefits. We discussed the following Behavioral  Modification Strategies today: work on meal planning and cooking strategies and ways to avoid boredom eating  Naileah has agreed to follow up with our clinic in 3 weeks. She was informed of the importance of frequent follow up visits to maximize her success with intensive lifestyle modifications for her multiple health conditions.  ALLERGIES: No Known Allergies  MEDICATIONS: Current Outpatient Medications  on File Prior to Visit  Medication Sig Dispense Refill  . fexofenadine (ALLEGRA) 180 MG tablet Take 180 mg by mouth daily.    . InFLIXimab (REMICADE IV) IV infusion every 2 months    . letrozole (FEMARA) 2.5 MG tablet Take 1 tablet by mouth daily.    . Prenatal Vit-Fe Fumarate-FA (PRENATAL PO) Take 1 tablet by mouth daily.     No current facility-administered medications on file prior to visit.     PAST MEDICAL HISTORY: Past Medical History:  Diagnosis Date  . Abrasion of upper arm with infection, left, sequela    started antibiotics lef arm oct 4th started antibiotics healing well  . Anemia    hx of 2 yrs ago  . Chicken pox   . Chronic rhinitis   . Contact dermatitis and other eczema, due to unspecified cause   . Crohn's 11-15-11   no issues in 2 yrs  . Fibroids 11-15-11   remains with fibroids  . Hernia   . Incisional hernia, RLQ 10/02/2011  . Infertility, female   . Mononucleosis   . Obesity   . Pilonidal abscess   . Rectal abscess   . 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 Vitamin D deficiency     PAST SURGICAL HISTORY: Past Surgical History:  Procedure Laterality Date  . APPENDECTOMY  7/06  . COLONOSCOPY WITH PROPOFOL N/A 03/07/2017   Procedure: COLONOSCOPY WITH PROPOFOL;  Surgeon: Milus Banister, MD;  Location: [facility];  Service: Endoscopy;  Laterality: N/A;  . EYE SURGERY  11-15-11   2003-multiple stys  . ileocecal resection  1/11   crohns disease with fisula/stricture. Dr. Ronnald Collum  . KNEE ARTHROSCOPY  99, 02   Bil.  [name] MYOMECTOMY N/A 11/10/2014   Procedure: MYOMECTOMY;  Surgeon: Everett Graff, MD;  Location: [facility];  Service: Gynecology;  Laterality: N/A;  . PILONIDAL CYST EXCISION  2011, 2012   I&Ds  . VENTRAL HERNIA REPAIR  11/27/2011   Procedure: LAPAROSCOPIC VENTRAL HERNIA;  Surgeon: Adin Hector, MD;  Location: [facility];  Service: General;  Laterality: N/A;  Laparoscopic Exploration & Repair of Hernia in Abdomen  . WISDOM TOOTH EXTRACTION       SOCIAL HISTORY: Social History   Tobacco Use  . Smoking status: Never Smoker  . Smokeless tobacco: Never Used  Substance Use Topics  . Alcohol use: No  . Drug use: No    FAMILY HISTORY: Family History  Problem Relation Age of Onset  . Aneurysm Mother        brain  . Heart disease Mother 77       heart anyrism  . Healthy Father        fairly  . Diabetes Sister        half sister  . Bipolar disorder Sister        half sister  . Colon cancer Maternal Grandmother   . Stomach cancer Paternal Grandfather   . Multiple sclerosis Sister     ROS: Review of Systems  Constitutional: Positive for weight loss.  Gastrointestinal: Negative for nausea and vomiting.  Genitourinary: Negative  for frequency.  Musculoskeletal:       Negative for muscle weakness  Endo/Heme/Allergies: Negative for polydipsia.       Negative for polyphagia Negative for hypoglycemia     PHYSICAL EXAM: Blood pressure 106/68, pulse 86, temperature 97.9 F (36.6 C), temperature source Oral, height 5' 3"  (1.6 m), weight 241 lb (109.3 kg), last menstrual period 05/07/2018, SpO2 93 %. Body mass index is 42.69 kg/m. Physical Exam Vitals signs reviewed.  Constitutional:      Appearance: Normal appearance. She is obese.  Cardiovascular:     Rate and Rhythm: Normal rate.     Pulses: Normal pulses.  Pulmonary:     Effort: Pulmonary effort is normal.  Musculoskeletal: Normal range of motion.  Skin:    General: Skin is warm and dry.  Neurological:     Mental Status: She is alert and oriented to person, place, and time.  Psychiatric:        Mood and Affect: Mood normal.        Behavior: Behavior normal.     RECENT LABS AND TESTS: BMET    Component Value Date/Time   NA 138 05/08/2018 0000   K 4.1 05/08/2018 0000   CL 102 05/08/2018 0000   CO2 22 05/08/2018 0000   GLUCOSE 82 05/08/2018 0000   GLUCOSE 77 07/23/2017 1606   BUN 15 05/08/2018 0000   CREATININE 0.64 05/08/2018 0000   CALCIUM  9.0 05/08/2018 0000   GFRNONAA 116 05/08/2018 0000   GFRAA 134 05/08/2018 0000   Lab Results  Component Value Date   HGBA1C 5.7 (H) 05/08/2018   HGBA1C 5.6 01/06/2018   HGBA1C 6.0 (H) 10/02/2017   HGBA1C 6.1 07/23/2017   Lab Results  Component Value Date   INSULIN 3.5 05/08/2018   INSULIN 10.1 01/06/2018   INSULIN 9.8 10/02/2017   CBC    Component Value Date/Time   WBC 13.1 (H) 10/02/2017 1245   WBC 14.4 (H) 07/23/2017 1606   RBC 4.53 10/02/2017 1245   RBC 4.42 07/23/2017 1606   HGB 11.2 10/02/2017 1245   HCT 36.9 10/02/2017 1245   PLT 412.0 (H) 07/23/2017 1606   MCV 82 10/02/2017 1245   MCH 24.7 (L) 10/02/2017 1245   MCH 29.2 11/12/2014 0536   MCHC 30.4 (L) 10/02/2017 1245   MCHC 31.6 07/23/2017 1606   RDW 17.4 (H) 10/02/2017 1245   LYMPHSABS 3.9 (H) 10/02/2017 1245   MONOABS 1.1 (H) 07/23/2017 1606   EOSABS 0.2 10/02/2017 1245   BASOSABS 0.0 10/02/2017 1245   Iron/TIBC/Ferritin/ %Sat    Component Value Date/Time   IRON 114 07/29/2013 0825   FERRITIN 14.8 07/29/2013 0825   IRONPCTSAT 36.8 07/29/2013 0825   Lipid Panel     Component Value Date/Time   CHOL 150 05/08/2018 0000   TRIG 42 05/08/2018 0000   HDL 42 05/08/2018 0000   CHOLHDL 3 07/23/2017 1606   VLDL 9.4 07/23/2017 1606   LDLCALC 100 (H) 05/08/2018 0000   Hepatic Function Panel     Component Value Date/Time   PROT 6.5 05/08/2018 0000   ALBUMIN 3.4 (L) 05/08/2018 0000   AST 13 05/08/2018 0000   ALT 11 05/08/2018 0000   ALKPHOS 79 05/08/2018 0000   BILITOT 0.2 05/08/2018 0000   BILIDIR 0.1 05/29/2016 0938      Component Value Date/Time   TSH 1.130 10/02/2017 1245   TSH 1.10 07/23/2017 1606   TSH 1.86 05/29/2016 0938     Ref. Range 05/08/2018 00:00  Vitamin  D, 25-Hydroxy Latest Ref Range: 30.0 - 100.0 ng/mL 49.5      OBESITY BEHAVIORAL INTERVENTION VISIT  Today's visit was # 12  Starting weight: 293 lbs Starting date: 10/02/2017 Today's weight : 241 lbs Today's date:  06/05/2018 Total lbs lost to date: 39    ASK: We discussed the diagnosis of obesity with Standley Dakins today and Timiya agreed to give [name] permission to discuss obesity behavioral modification therapy today.  ASSESS: Britanee has the diagnosis of obesity and her BMI today is 42.7 Jailyne is in the action stage of change   ADVISE: Donelda was educated on the multiple health risks of obesity as well as the benefit of weight loss to improve her health. She was advised of the need for long term treatment and the importance of lifestyle modifications to improve her current health and to decrease her risk of future health problems.  AGREE: Multiple dietary modification options and treatment options were discussed and  Fleta agreed to follow the recommendations documented in the above note.  ARRANGE: Milissa was educated on the importance of frequent visits to treat obesity as outlined per CMS and USPSTF guidelines and agreed to schedule her next follow up appointment today.  I, Tammy Wysor, am acting as Location manager for [employer], PA-C I, Abby Potash, PA-C have reviewed above note and agree with its content

## 2018-06-11 ENCOUNTER — Ambulatory Visit (HOSPITAL_COMMUNITY): Payer: BC Managed Care – PPO

## 2018-06-13 ENCOUNTER — Ambulatory Visit (HOSPITAL_COMMUNITY)
Admission: RE | Admit: 2018-06-13 | Discharge: 2018-06-13 | Disposition: A | Discharge status: Home / Self Care | Payer: BC Managed Care – PPO | Source: Ambulatory Visit | Attending: Obstetrics and Gynecology | Admitting: Obstetrics and Gynecology

## 2018-06-13 DIAGNOSIS — N7011 Chronic salpingitis: Secondary | ICD-10-CM | POA: Diagnosis not present

## 2018-06-13 DIAGNOSIS — N979 Female infertility, unspecified: Secondary | ICD-10-CM | POA: Insufficient documentation

## 2018-06-13 MED ORDER — IOPAMIDOL (ISOVUE-300) INJECTION 61%
30.0000 mL | Freq: Once | INTRAVENOUS | Status: AC | PRN
  Administered 2018-06-13: 10 mL

## 2018-06-26 ENCOUNTER — Ambulatory Visit (INDEPENDENT_AMBULATORY_CARE_PROVIDER_SITE_OTHER): Payer: BC Managed Care – PPO | Admitting: Physician Assistant

## 2018-06-26 ENCOUNTER — Encounter (INDEPENDENT_AMBULATORY_CARE_PROVIDER_SITE_OTHER): Payer: Self-pay | Admitting: Physician Assistant

## 2018-06-26 VITALS — BP 112/73 | HR 73 | Temp 97.7°F | Ht 63.0 in | Wt 236.0 lb

## 2018-06-26 DIAGNOSIS — E559 Vitamin D deficiency, unspecified: Secondary | ICD-10-CM

## 2018-06-26 DIAGNOSIS — Z6841 Body Mass Index (BMI) 40.0 and over, adult: Secondary | ICD-10-CM

## 2018-06-26 DIAGNOSIS — Z9189 Other specified personal risk factors, not elsewhere classified: Secondary | ICD-10-CM | POA: Diagnosis not present

## 2018-06-26 DIAGNOSIS — R7303 Prediabetes: Secondary | ICD-10-CM

## 2018-06-26 MED ORDER — VITAMIN D (ERGOCALCIFEROL) 1.25 MG (50000 UNIT) PO CAPS: 50000.0000 [IU] | ORAL_CAPSULE | ORAL | 0 refills | Status: DC

## 2018-06-26 MED ORDER — METFORMIN HCL 500 MG PO TABS: 500.0000 mg | ORAL_TABLET | Freq: Every day | ORAL | 0 refills | Status: DC

## 2018-06-26 NOTE — Progress Notes (Signed)
Office: [phone]  /  Fax: [phone]   HPI:   Chief Complaint: OBESITY [name] is here to discuss her progress with her obesity treatment plan. She is on the Category 3 plan and is following her eating plan approximately 98 % of the time. She states she is doing aerobics 10 minutes 3-4 times per week. [name] did well with weight loss. She reports that she has increased her exercise. She is not drinking enough water. Her weight is 236 lb (107 kg) today and has had a weight loss of 5 pounds over a period of 3 weeks since her last visit. She has lost 57 lbs since starting treatment with [name].  Pre-Diabetes [name] has a diagnosis of prediabetes based on her elevated Hgb A1c and was informed this puts her at greater risk of developing diabetes. She is taking metformin currently and continues to work on diet and exercise to decrease risk of diabetes. She denies nausea or hypoglycemia. She denies polyphagia.  Vitamin D deficiency [name] has a diagnosis of vitamin D deficiency. She is currently taking prescription Vit D and denies nausea, vomiting or muscle weakness.  At risk for diabetes [name] is at higher than average risk for developing diabetes due to her obesity. She currently denies polyuria or polydipsia.   ASSESSMENT AND PLAN:  Prediabetes - Plan: metFORMIN (GLUCOPHAGE) 500 MG tablet  Vitamin D deficiency - Plan: Vitamin D, Ergocalciferol, (DRISDOL) 1.25 MG (50000 UT) CAPS capsule  At risk for diabetes mellitus  Class 3 severe obesity with serious comorbidity and body mass index (BMI) of 40.0 to 44.9 in adult, unspecified obesity type ([place])  PLAN:  Pre-Diabetes [name] will continue to work on weight loss, exercise, and decreasing simple carbohydrates in her diet to help decrease the risk of diabetes. We dicussed metformin including benefits and risks. She was informed that eating too many simple carbohydrates or too many calories at one sitting increases the likelihood of GI  side effects. [name] agrees to continue taking metformin 500 mg daily #30 with no refills for now and a prescription was written today. [name] agrees to follow up with our clinic in 4 weeks.  Vitamin D Deficiency [name] was informed that low vitamin D levels contributes to fatigue and are associated with obesity, breast, and colon cancer. She agrees to continue to take prescription Vit D @50 ,000 IU every 14 days #2  with no refills and will follow up for routine testing of vitamin D, at least 2-3 times per year. She was informed of the risk of over-replacement of vitamin D and agrees to not increase her dose unless she discusses this with [name] first. [name] agrees to follow up with our clinic in 4 weeks.   Diabetes risk counseling [name] was given extended (15 minutes) diabetes prevention counseling today. She is 38 y.o. female and has risk factors for diabetes including obesity. We discussed intensive lifestyle modifications today with an emphasis on weight loss as well as increasing exercise and decreasing simple carbohydrates in her diet.   Obesity [name] is currently in the action stage of change. As such, her goal is to continue with weight loss efforts She has agreed to follow the Category 3 plan [name] has been instructed to work up to a goal of 150 minutes of combined cardio and strengthening exercise per week for weight loss and overall health benefits. We discussed the following Behavioral Modification Strategies today: increase H20 intake, work on meal planning and easy cooking plans and planning for success  [name]  has agreed to follow up with our clinic in 4 weeks. She was informed of the importance of frequent follow up visits to maximize her success with intensive lifestyle modifications for her multiple health conditions.  ALLERGIES: No Known Allergies  MEDICATIONS: Current Outpatient Medications on File Prior to Visit  Medication Sig Dispense Refill  . fexofenadine (ALLEGRA) 180  MG tablet Take 180 mg by mouth daily.    . InFLIXimab (REMICADE IV) IV infusion every 2 months    . letrozole (FEMARA) 2.5 MG tablet Take 1 tablet by mouth daily.    . Prenatal Vit-Fe Fumarate-FA (PRENATAL PO) Take 1 tablet by mouth daily.     No current facility-administered medications on file prior to visit.     PAST MEDICAL HISTORY: Past Medical History:  Diagnosis Date  . Abrasion of upper arm with infection, left, sequela    started antibiotics lef arm oct 4th started antibiotics healing well  . Anemia    hx of 2 yrs ago  . Chicken pox   . Chronic rhinitis   . Contact dermatitis and other eczema, due to unspecified cause   . Crohn's 11-15-11   no issues in 2 yrs  . Fibroids 11-15-11   remains with fibroids  . Hernia   . Incisional hernia, RLQ 10/02/2011  . Infertility, female   . Mononucleosis   . Obesity   . Pilonidal abscess   . Rectal abscess   . 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 Vitamin D deficiency     PAST SURGICAL HISTORY: Past Surgical History:  Procedure Laterality Date  . APPENDECTOMY  7/06  . COLONOSCOPY WITH PROPOFOL N/A 03/07/2017   Procedure: COLONOSCOPY WITH PROPOFOL;  Surgeon: Milus Banister, MD;  Location: [facility];  Service: Endoscopy;  Laterality: N/A;  . EYE SURGERY  11-15-11   2003-multiple stys  . ileocecal resection  1/11   crohns disease with fisula/stricture. Dr. Ronnald Collum  . KNEE ARTHROSCOPY  99, 02   Bil.  [name] MYOMECTOMY N/A 11/10/2014   Procedure: MYOMECTOMY;  Surgeon: Everett Graff, MD;  Location: [facility];  Service: Gynecology;  Laterality: N/A;  . PILONIDAL CYST EXCISION  2011, 2012   I&Ds  . VENTRAL HERNIA REPAIR  11/27/2011   Procedure: LAPAROSCOPIC VENTRAL HERNIA;  Surgeon: Adin Hector, MD;  Location: [facility];  Service: General;  Laterality: N/A;  Laparoscopic Exploration & Repair of Hernia in Abdomen  . WISDOM TOOTH EXTRACTION      SOCIAL HISTORY: Social History   Tobacco Use  . Smoking status: Never  Smoker  . Smokeless tobacco: Never Used  Substance Use Topics  . Alcohol use: No  . Drug use: No    FAMILY HISTORY: Family History  Problem Relation Age of Onset  . Aneurysm Mother        brain  . Heart disease Mother 11       heart anyrism  . Healthy Father        fairly  . Diabetes Sister        half sister  . Bipolar disorder Sister        half sister  . Colon cancer Maternal Grandmother   . Stomach cancer Paternal Grandfather   . Multiple sclerosis Sister     ROS: Review of Systems  Constitutional: Positive for weight loss.  Gastrointestinal: Negative for nausea and vomiting.  Genitourinary:       Negative for polyuria  Musculoskeletal:       Negative for  muscle weakness  Endo/Heme/Allergies:       Negative for hypoglycemia Negative for polyphagia    PHYSICAL EXAM: Blood pressure 112/73, pulse 73, temperature 97.7 F (36.5 C), height 5' 3"  (1.6 m), weight 236 lb (107 kg), last menstrual period 06/06/2018, SpO2 96 %. Body mass index is 41.81 kg/m. Physical Exam Vitals signs reviewed.  Constitutional:      Appearance: Normal appearance. She is obese.  Cardiovascular:     Rate and Rhythm: Normal rate.     Pulses: Normal pulses.  Pulmonary:     Effort: Pulmonary effort is normal.  Musculoskeletal: Normal range of motion.  Skin:    General: Skin is warm and dry.  Neurological:     Mental Status: She is alert and oriented to person, place, and time.  Psychiatric:        Mood and Affect: Mood normal.        Behavior: Behavior normal.     RECENT LABS AND TESTS: BMET    Component Value Date/Time   NA 138 05/08/2018 0000   K 4.1 05/08/2018 0000   CL 102 05/08/2018 0000   CO2 22 05/08/2018 0000   GLUCOSE 82 05/08/2018 0000   GLUCOSE 77 07/23/2017 1606   BUN 15 05/08/2018 0000   CREATININE 0.64 05/08/2018 0000   CALCIUM 9.0 05/08/2018 0000   GFRNONAA 116 05/08/2018 0000   GFRAA 134 05/08/2018 0000   Lab Results  Component Value Date   HGBA1C  5.7 (H) 05/08/2018   HGBA1C 5.6 01/06/2018   HGBA1C 6.0 (H) 10/02/2017   HGBA1C 6.1 07/23/2017   Lab Results  Component Value Date   INSULIN 3.5 05/08/2018   INSULIN 10.1 01/06/2018   INSULIN 9.8 10/02/2017   CBC    Component Value Date/Time   WBC 13.1 (H) 10/02/2017 1245   WBC 14.4 (H) 07/23/2017 1606   RBC 4.53 10/02/2017 1245   RBC 4.42 07/23/2017 1606   HGB 11.2 10/02/2017 1245   HCT 36.9 10/02/2017 1245   PLT 412.0 (H) 07/23/2017 1606   MCV 82 10/02/2017 1245   MCH 24.7 (L) 10/02/2017 1245   MCH 29.2 11/12/2014 0536   MCHC 30.4 (L) 10/02/2017 1245   MCHC 31.6 07/23/2017 1606   RDW 17.4 (H) 10/02/2017 1245   LYMPHSABS 3.9 (H) 10/02/2017 1245   MONOABS 1.1 (H) 07/23/2017 1606   EOSABS 0.2 10/02/2017 1245   BASOSABS 0.0 10/02/2017 1245   Iron/TIBC/Ferritin/ %Sat    Component Value Date/Time   IRON 114 07/29/2013 0825   FERRITIN 14.8 07/29/2013 0825   IRONPCTSAT 36.8 07/29/2013 0825   Lipid Panel     Component Value Date/Time   CHOL 150 05/08/2018 0000   TRIG 42 05/08/2018 0000   HDL 42 05/08/2018 0000   CHOLHDL 3 07/23/2017 1606   VLDL 9.4 07/23/2017 1606   LDLCALC 100 (H) 05/08/2018 0000   Hepatic Function Panel     Component Value Date/Time   PROT 6.5 05/08/2018 0000   ALBUMIN 3.4 (L) 05/08/2018 0000   AST 13 05/08/2018 0000   ALT 11 05/08/2018 0000   ALKPHOS 79 05/08/2018 0000   BILITOT 0.2 05/08/2018 0000   BILIDIR 0.1 05/29/2016 0938      Component Value Date/Time   TSH 1.130 10/02/2017 1245   TSH 1.10 07/23/2017 1606   TSH 1.86 05/29/2016 0938     Ref. Range 05/08/2018 00:00  Vitamin D, 25-Hydroxy Latest Ref Range: 30.0 - 100.0 ng/mL 49.5     OBESITY BEHAVIORAL INTERVENTION VISIT  Today's visit was # 13   Starting weight: 293 lbs Starting date: 10/02/2017 Today's weight :: 236 lbs Today's date: 06/26/2018 Total lbs lost to date: 26  ASK: We discussed the diagnosis of obesity with Standley Dakins today and Doniesha agreed to give [name]  permission to discuss obesity behavioral modification therapy today.  ASSESS: Lavaeh has the diagnosis of obesity and her BMI today is 41.82 Yovana is in the action stage of change   ADVISE: Miana was educated on the multiple health risks of obesity as well as the benefit of weight loss to improve her health. She was advised of the need for long term treatment and the importance of lifestyle modifications to improve her current health and to decrease her risk of future health problems.  AGREE: Multiple dietary modification options and treatment options were discussed and  Aritza agreed to follow the recommendations documented in the above note.  ARRANGE: Latonia was educated on the importance of frequent visits to treat obesity as outlined per CMS and USPSTF guidelines and agreed to schedule her next follow up appointment today.  I, Tammy Wysor, am acting as Location manager for [employer] I, Abby Potash, PA-C have reviewed above note and agree with its content

## 2018-07-22 ENCOUNTER — Encounter (INDEPENDENT_AMBULATORY_CARE_PROVIDER_SITE_OTHER): Payer: Self-pay | Admitting: Physician Assistant

## 2018-07-22 ENCOUNTER — Ambulatory Visit (INDEPENDENT_AMBULATORY_CARE_PROVIDER_SITE_OTHER): Payer: BC Managed Care – PPO | Admitting: Physician Assistant

## 2018-07-22 VITALS — BP 104/67 | HR 80 | Temp 98.1°F | Ht 63.0 in | Wt 231.0 lb

## 2018-07-22 DIAGNOSIS — E559 Vitamin D deficiency, unspecified: Secondary | ICD-10-CM | POA: Diagnosis not present

## 2018-07-22 DIAGNOSIS — Z6834 Body mass index (BMI) 34.0-34.9, adult: Secondary | ICD-10-CM | POA: Diagnosis not present

## 2018-07-22 DIAGNOSIS — R7303 Prediabetes: Secondary | ICD-10-CM | POA: Diagnosis not present

## 2018-07-22 DIAGNOSIS — E669 Obesity, unspecified: Secondary | ICD-10-CM

## 2018-07-22 DIAGNOSIS — Z9189 Other specified personal risk factors, not elsewhere classified: Secondary | ICD-10-CM | POA: Diagnosis not present

## 2018-07-22 MED ORDER — VITAMIN D (ERGOCALCIFEROL) 1.25 MG (50000 UNIT) PO CAPS: 50000.0000 [IU] | ORAL_CAPSULE | ORAL | 0 refills | Status: DC

## 2018-07-22 MED ORDER — METFORMIN HCL 500 MG PO TABS: 500.0000 mg | ORAL_TABLET | Freq: Every day | ORAL | 0 refills | Status: DC

## 2018-07-23 NOTE — Progress Notes (Signed)
Office: [phone]  /  Fax: [phone]   HPI:   Chief Complaint: OBESITY [name] is here to discuss her progress with her obesity treatment plan. She is on the Category 3 plan and is following her eating plan approximately 92 % of the time. She states she is exercising 0 minutes 0 times per week. [name] did very well with weight loss. She is following the plan closely and reports that her hunger is controlled.  Her weight is 231 lb (104.8 kg) today and has had a weight loss of 5 pounds over a period of 3 to 4 weeks since her last visit. She has lost 62 lbs since starting treatment with [name].  Vitamin D Deficiency [name] has a diagnosis of vitamin D deficiency. She is on prescription Vit D and denies nausea, vomiting or muscle weakness.  Pre-Diabetes [name] has a diagnosis of pre-diabetes based on her elevated Hgb A1c and was informed this puts her at greater risk of developing diabetes. She denies nausea, vomiting, or diarrhea on metformin. She continues to work on diet and exercise to decrease risk of diabetes. She denies polyphagia or hypoglycemia.  At risk for diabetes [name] is at higher than average risk for developing diabetes due to her obesity and pre-diabetes. She currently denies polyuria or polydipsia.  ALLERGIES: No Known Allergies  MEDICATIONS: Current Outpatient Medications on File Prior to Visit  Medication Sig Dispense Refill  . fexofenadine (ALLEGRA) 180 MG tablet Take 180 mg by mouth daily.    . InFLIXimab (REMICADE IV) IV infusion every 2 months    . letrozole (FEMARA) 2.5 MG tablet Take 1 tablet by mouth daily.    . Prenatal Vit-Fe Fumarate-FA (PRENATAL PO) Take 1 tablet by mouth daily.     No current facility-administered medications on file prior to visit.     PAST MEDICAL HISTORY: Past Medical History:  Diagnosis Date  . Abrasion of upper arm with infection, left, sequela    started antibiotics lef arm oct 4th started antibiotics healing well  .  Anemia    hx of 2 yrs ago  . Chicken pox   . Chronic rhinitis   . Contact dermatitis and other eczema, due to unspecified cause   . Crohn's 11-15-11   no issues in 2 yrs  . Fibroids 11-15-11   remains with fibroids  . Hernia   . Incisional hernia, RLQ 10/02/2011  . Infertility, female   . Mononucleosis   . Obesity   . Pilonidal abscess   . Rectal abscess   . 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 Vitamin D deficiency     PAST SURGICAL HISTORY: Past Surgical History:  Procedure Laterality Date  . APPENDECTOMY  7/06  . COLONOSCOPY WITH PROPOFOL N/A 03/07/2017   Procedure: COLONOSCOPY WITH PROPOFOL;  Surgeon: Milus Banister, MD;  Location: [facility];  Service: Endoscopy;  Laterality: N/A;  . EYE SURGERY  11-15-11   2003-multiple stys  . ileocecal resection  1/11   crohns disease with fisula/stricture. Dr. Ronnald Collum  . KNEE ARTHROSCOPY  99, 02   Bil.  [name] MYOMECTOMY N/A 11/10/2014   Procedure: MYOMECTOMY;  Surgeon: Everett Graff, MD;  Location: [facility];  Service: Gynecology;  Laterality: N/A;  . PILONIDAL CYST EXCISION  2011, 2012   I&Ds  . VENTRAL HERNIA REPAIR  11/27/2011   Procedure: LAPAROSCOPIC VENTRAL HERNIA;  Surgeon: Adin Hector, MD;  Location: [facility];  Service: General;  Laterality: N/A;  Laparoscopic Exploration & Repair of  Hernia in Abdomen  . WISDOM TOOTH EXTRACTION      SOCIAL HISTORY: Social History   Tobacco Use  . Smoking status: Never Smoker  . Smokeless tobacco: Never Used  Substance Use Topics  . Alcohol use: No  . Drug use: No    FAMILY HISTORY: Family History  Problem Relation Age of Onset  . Aneurysm Mother        brain  . Heart disease Mother 96       heart anyrism  . Healthy Father        fairly  . Diabetes Sister        half sister  . Bipolar disorder Sister        half sister  . Colon cancer Maternal Grandmother   . Stomach cancer Paternal Grandfather   . Multiple sclerosis Sister     ROS: Review of Systems    Constitutional: Positive for weight loss.  Gastrointestinal: Negative for diarrhea, nausea and vomiting.  Genitourinary: Negative for frequency.  Musculoskeletal:       Negative muscle weakness  Endo/Heme/Allergies: Negative for polydipsia.       Negative hypoglycemia Negative polyphagia    PHYSICAL EXAM: Blood pressure 104/67, pulse 80, temperature 98.1 F (36.7 C), temperature source Oral, height 5' 3"  (1.6 m), weight 231 lb (104.8 kg), SpO2 94 %. Body mass index is 40.92 kg/m. Physical Exam Vitals signs reviewed.  Constitutional:      Appearance: Normal appearance. She is obese.  Cardiovascular:     Rate and Rhythm: Normal rate.     Pulses: Normal pulses.  Pulmonary:     Effort: Pulmonary effort is normal.     Breath sounds: Normal breath sounds.  Musculoskeletal: Normal range of motion.  Skin:    General: Skin is warm and dry.  Neurological:     Mental Status: She is alert and oriented to person, place, and time.  Psychiatric:        Mood and Affect: Mood normal.        Behavior: Behavior normal.     RECENT LABS AND TESTS: BMET    Component Value Date/Time   NA 138 05/08/2018 0000   K 4.1 05/08/2018 0000   CL 102 05/08/2018 0000   CO2 22 05/08/2018 0000   GLUCOSE 82 05/08/2018 0000   GLUCOSE 77 07/23/2017 1606   BUN 15 05/08/2018 0000   CREATININE 0.64 05/08/2018 0000   CALCIUM 9.0 05/08/2018 0000   GFRNONAA 116 05/08/2018 0000   GFRAA 134 05/08/2018 0000   Lab Results  Component Value Date   HGBA1C 5.7 (H) 05/08/2018   HGBA1C 5.6 01/06/2018   HGBA1C 6.0 (H) 10/02/2017   HGBA1C 6.1 07/23/2017   Lab Results  Component Value Date   INSULIN 3.5 05/08/2018   INSULIN 10.1 01/06/2018   INSULIN 9.8 10/02/2017   CBC    Component Value Date/Time   WBC 13.1 (H) 10/02/2017 1245   WBC 14.4 (H) 07/23/2017 1606   RBC 4.53 10/02/2017 1245   RBC 4.42 07/23/2017 1606   HGB 11.2 10/02/2017 1245   HCT 36.9 10/02/2017 1245   PLT 412.0 (H) 07/23/2017 1606    MCV 82 10/02/2017 1245   MCH 24.7 (L) 10/02/2017 1245   MCH 29.2 11/12/2014 0536   MCHC 30.4 (L) 10/02/2017 1245   MCHC 31.6 07/23/2017 1606   RDW 17.4 (H) 10/02/2017 1245   LYMPHSABS 3.9 (H) 10/02/2017 1245   MONOABS 1.1 (H) 07/23/2017 1606   EOSABS 0.2 10/02/2017 1245  BASOSABS 0.0 10/02/2017 1245   Iron/TIBC/Ferritin/ %Sat    Component Value Date/Time   IRON 114 07/29/2013 0825   FERRITIN 14.8 07/29/2013 0825   IRONPCTSAT 36.8 07/29/2013 0825   Lipid Panel     Component Value Date/Time   CHOL 150 05/08/2018 0000   TRIG 42 05/08/2018 0000   HDL 42 05/08/2018 0000   CHOLHDL 3 07/23/2017 1606   VLDL 9.4 07/23/2017 1606   LDLCALC 100 (H) 05/08/2018 0000   Hepatic Function Panel     Component Value Date/Time   PROT 6.5 05/08/2018 0000   ALBUMIN 3.4 (L) 05/08/2018 0000   AST 13 05/08/2018 0000   ALT 11 05/08/2018 0000   ALKPHOS 79 05/08/2018 0000   BILITOT 0.2 05/08/2018 0000   BILIDIR 0.1 05/29/2016 0938      Component Value Date/Time   TSH 1.130 10/02/2017 1245   TSH 1.10 07/23/2017 1606   TSH 1.86 05/29/2016 0938    ASSESSMENT AND PLAN: Vitamin D deficiency - Plan: Vitamin D, Ergocalciferol, (DRISDOL) 1.25 MG (50000 UT) CAPS capsule  Prediabetes - Plan: metFORMIN (GLUCOPHAGE) 500 MG tablet  At risk for diabetes mellitus  Class 1 obesity with serious comorbidity and body mass index (BMI) of 34.0 to 34.9 in adult, unspecified obesity type  PLAN:  Vitamin D Deficiency Allisson was informed that low vitamin D levels contributes to fatigue and are associated with obesity, breast, and colon cancer. Kynslie agrees to continue taking prescription Vit D @50 ,000 IU every 14 days #2 and we will refill for 1 month. She will follow up for routine testing of vitamin D, at least 2-3 times per year. She was informed of the risk of over-replacement of vitamin D and agrees to not increase her dose unless she discusses this with [name] first. Hiliary agrees to follow up with our  clinic in 4 weeks.  Pre-Diabetes Frieda will continue to work on weight loss, exercise, and decreasing simple carbohydrates in her diet to help decrease the risk of diabetes. We dicussed metformin including benefits and risks. She was informed that eating too many simple carbohydrates or too many calories at one sitting increases the likelihood of GI side effects. Brionna agrees to continue taking metformin 500 mg q AM #30 and we will refill for 1 month. Eilah agrees to follow up with our clinic in 4 weeks as directed to monitor her progress.  Diabetes risk counseling Gennie was given extended (15 minutes) diabetes prevention counseling today. She is 38 y.o. female and has risk factors for diabetes including obesity and pre-diabetes. We discussed intensive lifestyle modifications today with an emphasis on weight loss as well as increasing exercise and decreasing simple carbohydrates in her diet.  Obesity Charene is currently in the action stage of change. As such, her goal is to continue with weight loss efforts She has agreed to follow the Category 3 plan Naylani has been instructed to work up to a goal of 150 minutes of combined cardio and strengthening exercise per week for weight loss and overall health benefits. We discussed the following Behavioral Modification Strategies today: work on meal planning and easy cooking plans and planning for success   Velta has agreed to follow up with our clinic in 4 weeks. She was informed of the importance of frequent follow up visits to maximize her success with intensive lifestyle modifications for her multiple health conditions.   OBESITY BEHAVIORAL INTERVENTION VISIT  Today's visit was # 14   Starting weight: 293 lbs Starting date: 10/02/17 Today's  weight : 231 lbs  Today's date: 07/22/2018 Total lbs lost to date: 62    07/22/2018  Height 5' 3"  (1.6 m)  Weight 231 lb (104.8 kg)  BMI (Calculated) 40.93  BLOOD PRESSURE - SYSTOLIC 683  BLOOD  PRESSURE - DIASTOLIC 67   Body Fat % 72.9 %  Total Body Water (lbs) 107.4 lbs     ASK: We discussed the diagnosis of obesity with Standley Dakins today and Akylah agreed to give [name] permission to discuss obesity behavioral modification therapy today.  ASSESS: Hadlei has the diagnosis of obesity and her BMI today is 40.93 Tonya is in the action stage of change   ADVISE: [place] was educated on the multiple health risks of obesity as well as the benefit of weight loss to improve her health. She was advised of the need for long term treatment and the importance of lifestyle modifications.  AGREE: Multiple dietary modification options and treatment options were discussed and  Natalyia agreed to the above obesity treatment plan.  Wilhemena Durie, am acting as transcriptionist for Abby Potash, PA-C I, Abby Potash, PA-C have reviewed above note and agree with its content

## 2018-08-19 ENCOUNTER — Ambulatory Visit (INDEPENDENT_AMBULATORY_CARE_PROVIDER_SITE_OTHER): Payer: BC Managed Care – PPO | Admitting: Physician Assistant

## 2018-08-19 ENCOUNTER — Encounter (INDEPENDENT_AMBULATORY_CARE_PROVIDER_SITE_OTHER): Payer: Self-pay

## 2018-08-19 ENCOUNTER — Encounter (INDEPENDENT_AMBULATORY_CARE_PROVIDER_SITE_OTHER): Payer: Self-pay | Admitting: Physician Assistant

## 2018-08-19 ENCOUNTER — Other Ambulatory Visit: Payer: Self-pay

## 2018-08-19 DIAGNOSIS — Z6841 Body Mass Index (BMI) 40.0 and over, adult: Secondary | ICD-10-CM | POA: Diagnosis not present

## 2018-08-19 DIAGNOSIS — R7303 Prediabetes: Secondary | ICD-10-CM | POA: Diagnosis not present

## 2018-08-19 DIAGNOSIS — E559 Vitamin D deficiency, unspecified: Secondary | ICD-10-CM | POA: Diagnosis not present

## 2018-08-19 MED ORDER — METFORMIN HCL 500 MG PO TABS: 500.0000 mg | ORAL_TABLET | Freq: Every day | ORAL | 0 refills | Status: DC

## 2018-08-19 MED ORDER — VITAMIN D (ERGOCALCIFEROL) 1.25 MG (50000 UNIT) PO CAPS: 50000.0000 [IU] | ORAL_CAPSULE | ORAL | 0 refills | Status: DC

## 2018-08-19 NOTE — Progress Notes (Signed)
Office: [phone]  /  Fax: [phone]  TeleHealth Visit: [name] has verbally consented to this TeleHealth visit today. The patient is located at home, the provider is located at the [facility] office. The participants in this visit include the listed provider and the patient. The visit was conducted by FaceTime.    HPI:   Chief Complaint: OBESITY [name] is here to discuss her progress with her obesity treatment plan. She is on the Category 3 plan and is following her eating plan approximately 95 % of the time. She states she is doing Wii exercise for 30 minutes 3 times per week. Telemedicine visit Jalaiya at home. She reports that according to her scale she has lost 3 pounds and current weight is 228. She continues to find the plan easy to follow. She is going to start exercising more.   Vitamin D Deficiency [name] has a diagnosis of vitamin D deficiency. She is currently taking prescription Vit D and denies nausea, vomiting or muscle weakness.  Pre-Diabetes [name] has a diagnosis of pre-diabetes based on her elevated Hgb A1c and was informed this puts her at greater risk of developing diabetes. She is taking metformin currently and denies nausea, vomiting, or diarrhea. She continues to work on diet and exercise to decrease risk of diabetes. She denies polyphagia or hypoglycemia.  ALLERGIES: No Known Allergies  MEDICATIONS: Current Outpatient Medications on File Prior to Visit  Medication Sig Dispense Refill   fexofenadine (ALLEGRA) 180 MG tablet Take 180 mg by mouth daily.     InFLIXimab (REMICADE IV) IV infusion every 2 months     letrozole (FEMARA) 2.5 MG tablet Take 1 tablet by mouth daily.     Prenatal Vit-Fe Fumarate-FA (PRENATAL PO) Take 1 tablet by mouth daily.     No current facility-administered medications on file prior to visit.     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 Dr. Ronnald Collum   KNEE ARTHROSCOPY  99, 02   Bil.   MYOMECTOMY N/A 11/10/2014   Procedure: MYOMECTOMY;  Surgeon: Everett Graff, MD;  Location: [facility];  Service: Gynecology;  Laterality: N/A;   PILONIDAL CYST EXCISION  2011, 2012   I&Ds   VENTRAL HERNIA REPAIR  11/27/2011   Procedure: LAPAROSCOPIC VENTRAL HERNIA;  Surgeon: Adin Hector, MD;  Location: [facility];  Service: General;  Laterality: N/A;  Laparoscopic Exploration & Repair of Hernia in Abdomen   WISDOM TOOTH EXTRACTION      SOCIAL HISTORY: Social History   Tobacco Use   Smoking status: Never Smoker   Smokeless tobacco: Never Used  Substance Use Topics   Alcohol use: No   Drug use: No    FAMILY HISTORY: Family History  Problem Relation Age of Onset   Aneurysm Mother        brain   Heart disease Mother 69       heart anyrism   Healthy Father        fairly   Diabetes Sister        half sister   Bipolar disorder Sister        half sister   Colon cancer Maternal Grandmother    Stomach cancer  Paternal Grandfather    Multiple sclerosis Sister     ROS: Review of Systems  Constitutional: Positive for weight loss.  Gastrointestinal: Negative for diarrhea, nausea and vomiting.  Musculoskeletal:       Negative muscle weakness  Endo/Heme/Allergies:       Negative polyphagia Negative hypoglycemia    PHYSICAL EXAM: There were no vitals taken for this visit. There is no height or weight on file to calculate BMI. Physical Exam  RECENT LABS AND TESTS: BMET    Component Value Date/Time   NA 138 05/08/2018 0000   K 4.1 05/08/2018 0000   CL 102 05/08/2018 0000   CO2 22 05/08/2018 0000   GLUCOSE 82 05/08/2018 0000   GLUCOSE 77 07/23/2017 1606   BUN 15 05/08/2018 0000   CREATININE 0.64 05/08/2018 0000   CALCIUM 9.0 05/08/2018 0000   GFRNONAA 116 05/08/2018 0000   GFRAA 134 05/08/2018 0000   Lab Results  Component Value Date   HGBA1C 5.7 (H) 05/08/2018   HGBA1C 5.6 01/06/2018   HGBA1C 6.0 (H) 10/02/2017   HGBA1C 6.1 07/23/2017   Lab Results  Component Value Date   INSULIN 3.5 05/08/2018   INSULIN 10.1 01/06/2018   INSULIN 9.8 10/02/2017   CBC    Component Value Date/Time   WBC 13.1 (H) 10/02/2017 1245   WBC 14.4 (H) 07/23/2017 1606   RBC 4.53 10/02/2017 1245   RBC 4.42 07/23/2017 1606   HGB 11.2 10/02/2017 1245   HCT 36.9 10/02/2017 1245   PLT 412.0 (H) 07/23/2017 1606   MCV 82 10/02/2017 1245   MCH 24.7 (L) 10/02/2017 1245   MCH 29.2 11/12/2014 0536   MCHC 30.4 (L) 10/02/2017 1245   MCHC 31.6 07/23/2017 1606   RDW 17.4 (H) 10/02/2017 1245   LYMPHSABS 3.9 (H) 10/02/2017 1245   MONOABS 1.1 (H) 07/23/2017 1606   EOSABS 0.2 10/02/2017 1245   BASOSABS 0.0 10/02/2017 1245   Iron/TIBC/Ferritin/ %Sat    Component Value Date/Time   IRON 114 07/29/2013 0825   FERRITIN 14.8 07/29/2013 0825   IRONPCTSAT 36.8 07/29/2013 0825   Lipid Panel     Component Value Date/Time   CHOL 150 05/08/2018 0000   TRIG 42 05/08/2018 0000   HDL 42 05/08/2018 0000    CHOLHDL 3 07/23/2017 1606   VLDL 9.4 07/23/2017 1606   LDLCALC 100 (H) 05/08/2018 0000   Hepatic Function Panel     Component Value Date/Time   PROT 6.5 05/08/2018 0000   ALBUMIN 3.4 (L) 05/08/2018 0000   AST 13 05/08/2018 0000   ALT 11 05/08/2018 0000   ALKPHOS 79 05/08/2018 0000   BILITOT 0.2 05/08/2018 0000  BILIDIR 0.1 05/29/2016 0938      Component Value Date/Time   TSH 1.130 10/02/2017 1245   TSH 1.10 07/23/2017 1606   TSH 1.86 05/29/2016 4235    ASSESSMENT AND PLAN: Vitamin D deficiency - Plan: VITAMIN D 25 Hydroxy (Vit-D Deficiency, Fractures), Vitamin D, Ergocalciferol, (DRISDOL) 1.25 MG (50000 UT) CAPS capsule  Prediabetes - Plan: Comprehensive metabolic panel, Hemoglobin A1c, Insulin, random, Lipid Panel With LDL/HDL Ratio, metFORMIN (GLUCOPHAGE) 500 MG tablet  Class 3 severe obesity with serious comorbidity and body mass index (BMI) of 40.0 to 44.9 in adult, unspecified obesity type (HCC)  PLAN:  Vitamin D Deficiency [name] was informed that low vitamin D levels contributes to fatigue and are associated with obesity, breast, and colon cancer. [name] agrees to continue taking prescription Vit D @50 ,000 IU every 14 days #2 and we will refill for 1 month. She will follow up for routine testing of vitamin D, at least 2-3 times per year. She was informed of the risk of over-replacement of vitamin D and agrees to not increase her dose unless she discusses this with [name] first. [name] agrees to follow up with our clinic in 3 weeks.  Pre-Diabetes [name] will continue to work on weight loss, exercise, and decreasing simple carbohydrates in her diet to help decrease the risk of diabetes. We dicussed metformin including benefits and risks. She was informed that eating too many simple carbohydrates or too many calories at one sitting increases the likelihood of GI side effects. [name] agrees to continue taking metformin 500 mg q AM #30 and we will refill for 1 month. [name] agrees  to follow up with our clinic in 3 weeks as directed to monitor her progress.  I spent > than 50% of the 15 minute visit on counseling as documented in the note.  Obesity [name] is currently in the action stage of change. As such, her goal is to continue with weight loss efforts She has agreed to follow the Category 3 plan [name] has been instructed to work up to a goal of 150 minutes of combined cardio and strengthening exercise per week or start resistance training for weight loss and overall health benefits. We discussed the following Behavioral Modification Strategies today: work on meal planning and easy cooking plans and keeping healthy foods in the home   [name] has agreed to follow up with our clinic in 3 weeks. She was informed of the importance of frequent follow up visits to maximize her success with intensive lifestyle modifications for her multiple health conditions.   OBESITY BEHAVIORAL INTERVENTION VISIT  Today's visit was # 15   Starting weight: 293 lbs Starting date: 10/02/17 Today's weight : N/A  Today's date: 08/19/2018 Total lbs lost to date: N/A    ASK: We discussed the diagnosis of obesity with [name] today and [name] agreed to give [name] permission to discuss obesity behavioral modification therapy today.  ASSESS: [name] has the diagnosis of obesity and her BMI today is N/A [name] is in the action stage of change   ADVISE: [name] was educated on the multiple health risks of obesity as well as the benefit of weight loss to improve her health. She was advised of the need for long term treatment and the importance of lifestyle modifications.  AGREE: Multiple dietary modification options and treatment options were discussed and  [name] agreed to the above obesity treatment plan.  Wilhemena Durie, am acting as transcriptionist for Abby Potash, PA-C

## 2018-08-20 ENCOUNTER — Other Ambulatory Visit: Payer: Self-pay | Admitting: Surgery

## 2018-08-20 DIAGNOSIS — K432 Incisional hernia without obstruction or gangrene: Secondary | ICD-10-CM

## 2018-08-25 ENCOUNTER — Ambulatory Visit
Admission: RE | Admit: 2018-08-25 | Discharge: 2018-08-25 | Disposition: A | Discharge status: Home / Self Care | Payer: BC Managed Care – PPO | Source: Ambulatory Visit | Attending: Surgery | Admitting: Surgery

## 2018-08-25 ENCOUNTER — Other Ambulatory Visit: Payer: Self-pay

## 2018-08-25 DIAGNOSIS — K432 Incisional hernia without obstruction or gangrene: Secondary | ICD-10-CM

## 2018-08-25 MED ORDER — IOPAMIDOL (ISOVUE-300) INJECTION 61%
125.0000 mL | Freq: Once | INTRAVENOUS | Status: AC | PRN
  Administered 2018-08-25: 125 mL via INTRAVENOUS

## 2018-08-27 LAB — COMPREHENSIVE METABOLIC PANEL
ALT: 10 IU/L (ref 0–32)
AST: 12 IU/L (ref 0–40)
Albumin/Globulin Ratio: 0.9 — ABNORMAL LOW (ref 1.2–2.2)
Albumin: 3.5 g/dL — ABNORMAL LOW (ref 3.8–4.8)
Alkaline Phosphatase: 87 IU/L (ref 39–117)
BUN/Creatinine Ratio: 21 (ref 9–23)
BUN: 13 mg/dL (ref 6–20)
Bilirubin Total: 0.2 mg/dL (ref 0.0–1.2)
CO2: 23 mmol/L (ref 20–29)
Calcium: 9.1 mg/dL (ref 8.7–10.2)
Chloride: 99 mmol/L (ref 96–106)
Creatinine, Ser: 0.61 mg/dL (ref 0.57–1.00)
GFR calc Af Amer: 136 mL/min/{1.73_m2} (ref >59)
GFR calc non Af Amer: 118 mL/min/{1.73_m2} (ref >59)
Globulin, Total: 3.7 g/dL (ref 1.5–4.5)
Glucose: 86 mg/dL (ref 65–99)
POTASSIUM: 4.2 mmol/L (ref 3.5–5.2)
Sodium: 138 mmol/L (ref 134–144)
Total Protein: 7.2 g/dL (ref 6.0–8.5)

## 2018-08-27 LAB — LIPID PANEL WITH LDL/HDL RATIO
Cholesterol, Total: 150 mg/dL (ref 100–199)
HDL: 44 mg/dL (ref >39)
LDL Calculated: 98 mg/dL (ref 0–99)
LDl/HDL Ratio: 2.2 ratio (ref 0.0–3.2)
Triglycerides: 40 mg/dL (ref 0–149)
VLDL Cholesterol Cal: 8 mg/dL (ref 5–40)

## 2018-08-27 LAB — HEMOGLOBIN A1C
Est. average glucose Bld gHb Est-mCnc: 111 mg/dL
Hgb A1c MFr Bld: 5.5 % (ref 4.8–5.6)

## 2018-08-27 LAB — VITAMIN D 25 HYDROXY (VIT D DEFICIENCY, FRACTURES): VIT D 25 HYDROXY: 50.1 ng/mL (ref 30.0–100.0)

## 2018-08-27 LAB — INSULIN, RANDOM: INSULIN: 14.2 u[IU]/mL (ref 2.6–24.9)

## 2018-09-11 ENCOUNTER — Encounter (INDEPENDENT_AMBULATORY_CARE_PROVIDER_SITE_OTHER): Payer: Self-pay | Admitting: Physician Assistant

## 2018-09-11 ENCOUNTER — Ambulatory Visit (INDEPENDENT_AMBULATORY_CARE_PROVIDER_SITE_OTHER): Payer: BC Managed Care – PPO | Admitting: Physician Assistant

## 2018-09-11 ENCOUNTER — Other Ambulatory Visit: Payer: Self-pay

## 2018-09-11 DIAGNOSIS — E66813 Obesity, class 3: Secondary | ICD-10-CM

## 2018-09-11 DIAGNOSIS — Z6841 Body Mass Index (BMI) 40.0 and over, adult: Secondary | ICD-10-CM | POA: Diagnosis not present

## 2018-09-11 DIAGNOSIS — E559 Vitamin D deficiency, unspecified: Secondary | ICD-10-CM

## 2018-09-15 NOTE — Progress Notes (Signed)
Office: [phone]  /  Fax: [phone] TeleHealth Visit:  [name] has verbally consented to this TeleHealth visit today. The patient is located at home, the provider is located at the [facility] office. The participants in this visit include the listed provider and patient. The visit was conducted today via face time.  HPI:   Chief Complaint: OBESITY [name] is here to discuss her progress with her obesity treatment plan. She is on the Category 3 plan and is following her eating plan approximately 98 % of the time. She states she is exercising 0 minutes 0 times per week. [name] continues to do well with weight loss. She is not exercising as much with the stay at home order, but she states that she is going to start using resistance bands.  We were unable to weigh the patient today for this TeleHealth visit. She feels as if she has lost 2-3 lbs since her last visit. She has lost 62-65 lbs since starting treatment with [name].  Vitamin D Deficiency [name] has a diagnosis of vitamin D deficiency. She is currently taking prescription Vit D twice monthly, last level was at goal. She denies nausea, vomiting or muscle weakness.  ASSESSMENT AND PLAN:  Vitamin D deficiency  Class 3 severe obesity with serious comorbidity and body mass index (BMI) of 40.0 to 44.9 in adult, unspecified obesity type ([place])  PLAN:  Vitamin D Deficiency [name] was informed that low vitamin D levels contributes to fatigue and are associated with obesity, breast, and colon cancer. [name] agrees to continue taking prescription Vit D @50 ,000 IU every 14 days and will follow up for routine testing of vitamin D, at least 2-3 times per year. She was informed of the risk of over-replacement of vitamin D and agrees to not increase her dose unless she discusses this with [name] first. [name] agrees to follow up with our clinic in 3 weeks.  Obesity [name] is currently in the action stage of change. As such, her  goal is to continue with weight loss efforts She has agreed to follow the Category 3 plan [name] has been instructed to work up to a goal of 150 minutes of combined cardio and strengthening exercise per week for weight loss and overall health benefits. We discussed the following Behavioral Modification Strategies today: work on meal planning and easy cooking plans and ways to avoid boredom eating   [name] has agreed to follow up with our clinic in 3 weeks. She was informed of the importance of frequent follow up visits to maximize her success with intensive lifestyle modifications for her multiple health conditions.  ALLERGIES: No Known Allergies  MEDICATIONS: Current Outpatient Medications on File Prior to Visit  Medication Sig Dispense Refill  . fexofenadine (ALLEGRA) 180 MG tablet Take 180 mg by mouth daily.    . InFLIXimab (REMICADE IV) IV infusion every 2 months    . letrozole (FEMARA) 2.5 MG tablet Take 1 tablet by mouth daily.    . metFORMIN (GLUCOPHAGE) 500 MG tablet Take 1 tablet (500 mg total) by mouth daily with breakfast. 30 tablet 0  . Prenatal Vit-Fe Fumarate-FA (PRENATAL PO) Take 1 tablet by mouth daily.    . Vitamin D, Ergocalciferol, (DRISDOL) 1.25 MG (50000 UT) CAPS capsule Take 1 capsule (50,000 Units total) by mouth every 14 (fourteen) days. 2 capsule 0   No current facility-administered medications on file prior to visit.     PAST MEDICAL HISTORY: Past Medical History:  Diagnosis Date  .  Abrasion of upper arm with infection, left, sequela    started antibiotics lef arm oct 4th started antibiotics healing well  . Anemia    hx of 2 yrs ago  . Chicken pox   . Chronic rhinitis   . Contact dermatitis and other eczema, due to unspecified cause   . Crohn's 11-15-11   no issues in 2 yrs  . Fibroids 11-15-11   remains with fibroids  . Hernia   . Incisional hernia, RLQ 10/02/2011  . Infertility, female   . Mononucleosis   . Obesity   . Pilonidal abscess   . Rectal  abscess   . 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 Vitamin D deficiency     PAST SURGICAL HISTORY: Past Surgical History:  Procedure Laterality Date  . APPENDECTOMY  7/06  . COLONOSCOPY WITH PROPOFOL N/A 03/07/2017   Procedure: COLONOSCOPY WITH PROPOFOL;  Surgeon: Milus Banister, MD;  Location: [facility];  Service: Endoscopy;  Laterality: N/A;  . EYE SURGERY  11-15-11   2003-multiple stys  . ileocecal resection  1/11   crohns disease with fisula/stricture. Dr. Ronnald Collum  . KNEE ARTHROSCOPY  99, 02   Bil.  [name] MYOMECTOMY N/A 11/10/2014   Procedure: MYOMECTOMY;  Surgeon: Everett Graff, MD;  Location: [facility];  Service: Gynecology;  Laterality: N/A;  . PILONIDAL CYST EXCISION  2011, 2012   I&Ds  . VENTRAL HERNIA REPAIR  11/27/2011   Procedure: LAPAROSCOPIC VENTRAL HERNIA;  Surgeon: Adin Hector, MD;  Location: [facility];  Service: General;  Laterality: N/A;  Laparoscopic Exploration & Repair of Hernia in Abdomen  . WISDOM TOOTH EXTRACTION      SOCIAL HISTORY: Social History   Tobacco Use  . Smoking status: Never Smoker  . Smokeless tobacco: Never Used  Substance Use Topics  . Alcohol use: No  . Drug use: No    FAMILY HISTORY: Family History  Problem Relation Age of Onset  . Aneurysm Mother        brain  . Heart disease Mother 62       heart anyrism  . Healthy Father        fairly  . Diabetes Sister        half sister  . Bipolar disorder Sister        half sister  . Colon cancer Maternal Grandmother   . Stomach cancer Paternal Grandfather   . Multiple sclerosis Sister     ROS: Review of Systems  Constitutional: Positive for weight loss.  Gastrointestinal: Negative for nausea and vomiting.  Musculoskeletal:       Negative muscle weakness    PHYSICAL EXAM: Pt in no acute distress  RECENT LABS AND TESTS: BMET    Component Value Date/Time   NA 138 08/26/2018 0850   K 4.2 08/26/2018 0850   CL 99 08/26/2018 0850   CO2 23 08/26/2018 0850    GLUCOSE 86 08/26/2018 0850   GLUCOSE 77 07/23/2017 1606   BUN 13 08/26/2018 0850   CREATININE 0.61 08/26/2018 0850   CALCIUM 9.1 08/26/2018 0850   GFRNONAA 118 08/26/2018 0850   GFRAA 136 08/26/2018 0850   Lab Results  Component Value Date   HGBA1C 5.5 08/26/2018   HGBA1C 5.7 (H) 05/08/2018   HGBA1C 5.6 01/06/2018   HGBA1C 6.0 (H) 10/02/2017   HGBA1C 6.1 07/23/2017   Lab Results  Component Value Date   INSULIN 14.2 08/26/2018   INSULIN 3.5 05/08/2018   INSULIN 10.1 01/06/2018  INSULIN 9.8 10/02/2017   CBC    Component Value Date/Time   WBC 13.1 (H) 10/02/2017 1245   WBC 14.4 (H) 07/23/2017 1606   RBC 4.53 10/02/2017 1245   RBC 4.42 07/23/2017 1606   HGB 11.2 10/02/2017 1245   HCT 36.9 10/02/2017 1245   PLT 412.0 (H) 07/23/2017 1606   MCV 82 10/02/2017 1245   MCH 24.7 (L) 10/02/2017 1245   MCH 29.2 11/12/2014 0536   MCHC 30.4 (L) 10/02/2017 1245   MCHC 31.6 07/23/2017 1606   RDW 17.4 (H) 10/02/2017 1245   LYMPHSABS 3.9 (H) 10/02/2017 1245   MONOABS 1.1 (H) 07/23/2017 1606   EOSABS 0.2 10/02/2017 1245   BASOSABS 0.0 10/02/2017 1245   Iron/TIBC/Ferritin/ %Sat    Component Value Date/Time   IRON 114 07/29/2013 0825   FERRITIN 14.8 07/29/2013 0825   IRONPCTSAT 36.8 07/29/2013 0825   Lipid Panel     Component Value Date/Time   CHOL 150 08/26/2018 0850   TRIG 40 08/26/2018 0850   HDL 44 08/26/2018 0850   CHOLHDL 3 07/23/2017 1606   VLDL 9.4 07/23/2017 1606   LDLCALC 98 08/26/2018 0850   Hepatic Function Panel     Component Value Date/Time   PROT 7.2 08/26/2018 0850   ALBUMIN 3.5 (L) 08/26/2018 0850   AST 12 08/26/2018 0850   ALT 10 08/26/2018 0850   ALKPHOS 87 08/26/2018 0850   BILITOT 0.2 08/26/2018 0850   BILIDIR 0.1 05/29/2016 0938      Component Value Date/Time   TSH 1.130 10/02/2017 1245   TSH 1.10 07/23/2017 1606   TSH 1.86 05/29/2016 0938      I, Trixie Dredge, am acting as transcriptionist for Abby Potash, PA-C I, Abby Potash,  PA-C have reviewed above note and agree with its content

## 2018-09-30 ENCOUNTER — Ambulatory Visit (INDEPENDENT_AMBULATORY_CARE_PROVIDER_SITE_OTHER): Payer: BC Managed Care – PPO | Admitting: Physician Assistant

## 2018-09-30 ENCOUNTER — Other Ambulatory Visit: Payer: Self-pay

## 2018-09-30 ENCOUNTER — Encounter (INDEPENDENT_AMBULATORY_CARE_PROVIDER_SITE_OTHER): Payer: Self-pay | Admitting: Physician Assistant

## 2018-09-30 DIAGNOSIS — Z6841 Body Mass Index (BMI) 40.0 and over, adult: Secondary | ICD-10-CM

## 2018-09-30 DIAGNOSIS — E559 Vitamin D deficiency, unspecified: Secondary | ICD-10-CM

## 2018-09-30 DIAGNOSIS — R7303 Prediabetes: Secondary | ICD-10-CM

## 2018-09-30 MED ORDER — METFORMIN HCL 500 MG PO TABS: 500.0000 mg | ORAL_TABLET | Freq: Every day | ORAL | 0 refills | Status: DC

## 2018-09-30 MED ORDER — VITAMIN D (ERGOCALCIFEROL) 1.25 MG (50000 UNIT) PO CAPS: 50000.0000 [IU] | ORAL_CAPSULE | ORAL | 0 refills | Status: DC

## 2018-10-01 NOTE — Progress Notes (Signed)
Office: [phone]  /  Fax: [phone] TeleHealth Visit:  [name] has verbally consented to this TeleHealth visit today. The patient is located at home, the provider is located at the [employer] [facility] office. The participants in this visit include the listed provider and patient. The visit was conducted today via Webex.  HPI:   Chief Complaint: OBESITY [name] is here to discuss her progress with her obesity treatment plan. She is on the Category 3 plan and is following her eating plan approximately 99% of the time. She states she is exercising 0 minutes 0 times per week. Kavina is doing well overall with the plan. She is moving this weekend and wants to discuss options for eating out. She has stopped working out but will restart after the move. We were unable to weigh the patient today for this TeleHealth visit. She feels as if she has lost 2 lbs since her last visit. She has lost 62 lbs since starting treatment with [name].  Vitamin D deficiency [name] has a diagnosis of Vitamin D deficiency. She is currently taking prescription Vit D and denies nausea, vomiting or muscle weakness.  Pre-Diabetes [name] has a diagnosis of prediabetes based on her elevated Hgb A1c and was informed this puts her at greater risk of developing diabetes. She is taking metformin currently and continues to work on diet and exercise to decrease risk of diabetes. She denies nausea, vomiting, diarrhea, or polyphagia.  ASSESSMENT AND PLAN:  Prediabetes - Plan: metFORMIN (GLUCOPHAGE) 500 MG tablet  Vitamin D deficiency - Plan: Vitamin D, Ergocalciferol, (DRISDOL) 1.25 MG (50000 UT) CAPS capsule  Class 3 severe obesity with serious comorbidity and body mass index (BMI) of 40.0 to 44.9 in adult, unspecified obesity type ([place])  PLAN:  Vitamin D Deficiency [name] was informed that low Vitamin D levels contributes to fatigue and are associated with obesity, breast, and colon cancer. She agrees to  continue to take prescription Vit D @ 50,000 IU every 14 days #2 with 0 refills and will follow-up for routine testing of Vitamin D, at least 2-3 times per year. She was informed of the risk of over-replacement of Vitamin D and agrees to not increase her dose unless she discusses this with [name] first. [name] agrees to follow-up with our clinic in 2 weeks.  Pre-Diabetes [name] will continue to work on weight loss, exercise, and decreasing simple carbohydrates in her diet to help decrease the risk of diabetes. We dicussed metformin including benefits and risks. She was informed that eating too many simple carbohydrates or too many calories at one sitting increases the likelihood of GI side effects. [name] is currently taking metformin and a refill prescription was written today for #30 with 0 refills. [name] agrees to follow-up with our clinic in 2 weeks.  Obesity [name] is currently in the action stage of change. As such, her goal is to continue with weight loss efforts. She has agreed to follow the Category 3 plan. [name] has been instructed to work up to a goal of 150 minutes of combined cardio and strengthening exercise per week for weight loss and overall health benefits. We discussed the following Behavioral Modification Strategies today: work on meal planning, easy cooking plans, and keeping healthy foods in the home.  [name] has agreed to follow-up with our clinic in 2 weeks. She was informed of the importance of frequent follow-up visits to maximize her success with intensive lifestyle modifications for her multiple health conditions.  ALLERGIES: No Known Allergies  MEDICATIONS: Current Outpatient Medications on File Prior to Visit  Medication Sig Dispense Refill  . fexofenadine (ALLEGRA) 180 MG tablet Take 180 mg by mouth daily.    . InFLIXimab (REMICADE IV) IV infusion every 2 months    . letrozole (FEMARA) 2.5 MG tablet Take 1 tablet by mouth daily.    . Prenatal Vit-Fe Fumarate-FA  (PRENATAL PO) Take 1 tablet by mouth daily.     No current facility-administered medications on file prior to visit.     PAST MEDICAL HISTORY: Past Medical History:  Diagnosis Date  . Abrasion of upper arm with infection, left, sequela    started antibiotics lef arm oct 4th started antibiotics healing well  . Anemia    hx of 2 yrs ago  . Chicken pox   . Chronic rhinitis   . Contact dermatitis and other eczema, due to unspecified cause   . Crohn's 11-15-11   no issues in 2 yrs  . Fibroids 11-15-11   remains with fibroids  . Hernia   . Incisional hernia, RLQ 10/02/2011  . Infertility, female   . Mononucleosis   . Obesity   . Pilonidal abscess   . Rectal abscess   . 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 Vitamin D deficiency     PAST SURGICAL HISTORY: Past Surgical History:  Procedure Laterality Date  . APPENDECTOMY  7/06  . COLONOSCOPY WITH PROPOFOL N/A 03/07/2017   Procedure: COLONOSCOPY WITH PROPOFOL;  Surgeon: Milus Banister, MD;  Location: [facility];  Service: Endoscopy;  Laterality: N/A;  . EYE SURGERY  11-15-11   2003-multiple stys  . ileocecal resection  1/11   crohns disease with fisula/stricture. Dr. Ronnald Collum  . KNEE ARTHROSCOPY  99, 02   Bil.  [name] MYOMECTOMY N/A 11/10/2014   Procedure: MYOMECTOMY;  Surgeon: Everett Graff, MD;  Location: [facility];  Service: Gynecology;  Laterality: N/A;  . PILONIDAL CYST EXCISION  2011, 2012   I&Ds  . VENTRAL HERNIA REPAIR  11/27/2011   Procedure: LAPAROSCOPIC VENTRAL HERNIA;  Surgeon: Adin Hector, MD;  Location: [facility];  Service: General;  Laterality: N/A;  Laparoscopic Exploration & Repair of Hernia in Abdomen  . WISDOM TOOTH EXTRACTION      SOCIAL HISTORY: Social History   Tobacco Use  . Smoking status: Never Smoker  . Smokeless tobacco: Never Used  Substance Use Topics  . Alcohol use: No  . Drug use: No    FAMILY HISTORY: Family History  Problem Relation Age of Onset  . Aneurysm Mother         brain  . Heart disease Mother 50       heart anyrism  . Healthy Father        fairly  . Diabetes Sister        half sister  . Bipolar disorder Sister        half sister  . Colon cancer Maternal Grandmother   . Stomach cancer Paternal Grandfather   . Multiple sclerosis Sister    ROS: Review of Systems  Gastrointestinal: Negative for diarrhea, nausea and vomiting.  Musculoskeletal:       Negative for muscle weakness.  Endo/Heme/Allergies:       Negative for polyphagia.   PHYSICAL EXAM: Pt in no acute distress  RECENT LABS AND TESTS: BMET    Component Value Date/Time   NA 138 08/26/2018 0850   K 4.2 08/26/2018 0850   CL 99 08/26/2018 0850   CO2 23 08/26/2018 0850  GLUCOSE 86 08/26/2018 0850   GLUCOSE 77 07/23/2017 1606   BUN 13 08/26/2018 0850   CREATININE 0.61 08/26/2018 0850   CALCIUM 9.1 08/26/2018 0850   GFRNONAA 118 08/26/2018 0850   GFRAA 136 08/26/2018 0850   Lab Results  Component Value Date   HGBA1C 5.5 08/26/2018   HGBA1C 5.7 (H) 05/08/2018   HGBA1C 5.6 01/06/2018   HGBA1C 6.0 (H) 10/02/2017   HGBA1C 6.1 07/23/2017   Lab Results  Component Value Date   INSULIN 14.2 08/26/2018   INSULIN 3.5 05/08/2018   INSULIN 10.1 01/06/2018   INSULIN 9.8 10/02/2017   CBC    Component Value Date/Time   WBC 13.1 (H) 10/02/2017 1245   WBC 14.4 (H) 07/23/2017 1606   RBC 4.53 10/02/2017 1245   RBC 4.42 07/23/2017 1606   HGB 11.2 10/02/2017 1245   HCT 36.9 10/02/2017 1245   PLT 412.0 (H) 07/23/2017 1606   MCV 82 10/02/2017 1245   MCH 24.7 (L) 10/02/2017 1245   MCH 29.2 11/12/2014 0536   MCHC 30.4 (L) 10/02/2017 1245   MCHC 31.6 07/23/2017 1606   RDW 17.4 (H) 10/02/2017 1245   LYMPHSABS 3.9 (H) 10/02/2017 1245   MONOABS 1.1 (H) 07/23/2017 1606   EOSABS 0.2 10/02/2017 1245   BASOSABS 0.0 10/02/2017 1245   Iron/TIBC/Ferritin/ %Sat    Component Value Date/Time   IRON 114 07/29/2013 0825   FERRITIN 14.8 07/29/2013 0825   IRONPCTSAT 36.8 07/29/2013 0825    Lipid Panel     Component Value Date/Time   CHOL 150 08/26/2018 0850   TRIG 40 08/26/2018 0850   HDL 44 08/26/2018 0850   CHOLHDL 3 07/23/2017 1606   VLDL 9.4 07/23/2017 1606   LDLCALC 98 08/26/2018 0850   Hepatic Function Panel     Component Value Date/Time   PROT 7.2 08/26/2018 0850   ALBUMIN 3.5 (L) 08/26/2018 0850   AST 12 08/26/2018 0850   ALT 10 08/26/2018 0850   ALKPHOS 87 08/26/2018 0850   BILITOT 0.2 08/26/2018 0850   BILIDIR 0.1 05/29/2016 0938      Component Value Date/Time   TSH 1.130 10/02/2017 1245   TSH 1.10 07/23/2017 1606   TSH 1.86 05/29/2016 0938   Results for SAMAIYA, AWADALLAH (MRN [id number]) as of 10/01/2018 09:02  Ref. Range 08/26/2018 08:50  Vitamin D, 25-Hydroxy Latest Ref Range: 30.0 - 100.0 ng/mL 50.1    I, Michaelene Song, am acting as Location manager for [employer], PA-C I, Abby Potash, PA-C have reviewed above note and agree with its content

## 2018-10-15 ENCOUNTER — Encounter (INDEPENDENT_AMBULATORY_CARE_PROVIDER_SITE_OTHER): Payer: Self-pay | Admitting: Physician Assistant

## 2018-10-15 ENCOUNTER — Ambulatory Visit (INDEPENDENT_AMBULATORY_CARE_PROVIDER_SITE_OTHER): Payer: BC Managed Care – PPO | Admitting: Physician Assistant

## 2018-10-15 ENCOUNTER — Other Ambulatory Visit: Payer: Self-pay

## 2018-10-15 DIAGNOSIS — E559 Vitamin D deficiency, unspecified: Secondary | ICD-10-CM | POA: Diagnosis not present

## 2018-10-15 DIAGNOSIS — R7303 Prediabetes: Secondary | ICD-10-CM

## 2018-10-15 DIAGNOSIS — Z6841 Body Mass Index (BMI) 40.0 and over, adult: Secondary | ICD-10-CM

## 2018-10-15 MED ORDER — VITAMIN D (ERGOCALCIFEROL) 1.25 MG (50000 UNIT) PO CAPS: 50000.0000 [IU] | ORAL_CAPSULE | ORAL | 0 refills | Status: DC

## 2018-10-15 MED ORDER — METFORMIN HCL 500 MG PO TABS: 500.0000 mg | ORAL_TABLET | Freq: Every day | ORAL | 0 refills | Status: DC

## 2018-10-16 NOTE — Progress Notes (Signed)
Office: [phone]  /  Fax: [phone] TeleHealth Visit:  [name] has verbally consented to this TeleHealth visit today. The patient is located at home, the provider is located at the [employer] [facility] office. The participants in this visit include the listed provider and patient. The visit was conducted today via Webex.  HPI:   Chief Complaint: OBESITY [name] is here to discuss her progress with her obesity treatment plan. She is on the Category 3 plan and is following her eating plan approximately 85% of the time. She states she is exercising 0 minutes 0 times per week. [name] reports that her most recent weight is 216 lbs. She continues to do well with the plan. She just moved into a new house and is back to cooking all of her meals. We were unable to weigh the patient today for this TeleHealth visit. She feels as if she has lost 2-3 lbs (most recent weight 216 lbs) since her last visit. She has lost 62 lbs since starting treatment with [name].  Pre-Diabetes [name] has a diagnosis of prediabetes based on her elevated Hgb A1c and was informed this puts her at greater risk of developing diabetes. She is taking metformin currently and continues to work on diet and exercise to decrease risk of diabetes. She denies nausea, vomiting, or diarrhea. No polyphagia.  Vitamin D deficiency [name] has a diagnosis of Vitamin D deficiency. She is currently taking prescription Vit D and denies nausea, vomiting or muscle weakness.  ASSESSMENT AND PLAN:  Vitamin D deficiency - Plan: Vitamin D, Ergocalciferol, (DRISDOL) 1.25 MG (50000 UT) CAPS capsule  Prediabetes - Plan: metFORMIN (GLUCOPHAGE) 500 MG tablet  Class 3 severe obesity with serious comorbidity and body mass index (BMI) of 40.0 to 44.9 in adult, unspecified obesity type ([place])  PLAN:  Pre-Diabetes [name] will continue to work on weight loss, exercise, and decreasing simple carbohydrates in her diet to help decrease the risk of  diabetes. We dicussed metformin including benefits and risks. She was informed that eating too many simple carbohydrates or too many calories at one sitting increases the likelihood of GI side effects. [name] was given a refill on her metformin #30 with 0 refills and agrees to follow-up with our clinic in 2 weeks.  Vitamin D Deficiency [name] was informed that low Vitamin D levels contributes to fatigue and are associated with obesity, breast, and colon cancer. She agrees to continue to take prescription Vit D @ 50,000 IU every 14 days #2 with 0 refills and will follow-up for routine testing of Vitamin D, at least 2-3 times per year. She was informed of the risk of over-replacement of Vitamin D and agrees to not increase her dose unless she discusses this with [name] first. [name] agrees to follow-up with our clinic in 2 weeks.  Obesity [name] is currently in the action stage of change. As such, her goal is to continue with weight loss efforts. She has agreed to follow the Category 3 plan. [name] has been instructed to work up to a goal of 150 minutes of combined cardio and strengthening exercise per week for weight loss and overall health benefits. We discussed the following Behavioral Modification Strategies today: keeping healthy foods in the home and ways to avoid boredom eating.  [name] has agreed to follow-up with our clinic in 2 weeks. She was informed of the importance of frequent follow-up visits to maximize her success with intensive lifestyle modifications for her multiple health conditions.  ALLERGIES: No Known Allergies  MEDICATIONS: Current Outpatient Medications on File Prior to Visit  Medication Sig Dispense Refill  . fexofenadine (ALLEGRA) 180 MG tablet Take 180 mg by mouth daily.    . InFLIXimab (REMICADE IV) IV infusion every 2 months    . letrozole (FEMARA) 2.5 MG tablet Take 1 tablet by mouth daily.    . Prenatal Vit-Fe Fumarate-FA (PRENATAL PO) Take 1 tablet by mouth daily.      No current facility-administered medications on file prior to visit.     PAST MEDICAL HISTORY: Past Medical History:  Diagnosis Date  . Abrasion of upper arm with infection, left, sequela    started antibiotics lef arm oct 4th started antibiotics healing well  . Anemia    hx of 2 yrs ago  . Chicken pox   . Chronic rhinitis   . Contact dermatitis and other eczema, due to unspecified cause   . Crohn's 11-15-11   no issues in 2 yrs  . Fibroids 11-15-11   remains with fibroids  . Hernia   . Incisional hernia, RLQ 10/02/2011  . Infertility, female   . Mononucleosis   . Obesity   . Pilonidal abscess   . Rectal abscess   . 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 Vitamin D deficiency     PAST SURGICAL HISTORY: Past Surgical History:  Procedure Laterality Date  . APPENDECTOMY  7/06  . COLONOSCOPY WITH PROPOFOL N/A 03/07/2017   Procedure: COLONOSCOPY WITH PROPOFOL;  Surgeon: Milus Banister, MD;  Location: [facility];  Service: Endoscopy;  Laterality: N/A;  . EYE SURGERY  11-15-11   2003-multiple stys  . ileocecal resection  1/11   crohns disease with fisula/stricture. Dr. Ronnald Collum  . KNEE ARTHROSCOPY  99, 02   Bil.  [name] Kitchen MYOMECTOMY N/A 11/10/2014   Procedure: MYOMECTOMY;  Surgeon: Everett Graff, MD;  Location: [facility];  Service: Gynecology;  Laterality: N/A;  . PILONIDAL CYST EXCISION  2011, 2012   I&Ds  . VENTRAL HERNIA REPAIR  11/27/2011   Procedure: LAPAROSCOPIC VENTRAL HERNIA;  Surgeon: Adin Hector, MD;  Location: [facility];  Service: General;  Laterality: N/A;  Laparoscopic Exploration & Repair of Hernia in Abdomen  . WISDOM TOOTH EXTRACTION      SOCIAL HISTORY: Social History   Tobacco Use  . Smoking status: Never Smoker  . Smokeless tobacco: Never Used  Substance Use Topics  . Alcohol use: No  . Drug use: No    FAMILY HISTORY: Family History  Problem Relation Age of Onset  . Aneurysm Mother        brain  . Heart disease Mother 32       heart  anyrism  . Healthy Father        fairly  . Diabetes Sister        half sister  . Bipolar disorder Sister        half sister  . Colon cancer Maternal Grandmother   . Stomach cancer Paternal Grandfather   . Multiple sclerosis Sister    ROS: Review of Systems  Gastrointestinal: Negative for diarrhea, nausea and vomiting.  Musculoskeletal:       Negative for muscle weakness.  Endo/Heme/Allergies:       Negative for polyphagia.   PHYSICAL EXAM: Pt in no acute distress  RECENT LABS AND TESTS: BMET    Component Value Date/Time   NA 138 08/26/2018 0850   K 4.2 08/26/2018 0850   CL 99 08/26/2018 0850   CO2 23 08/26/2018 0850  GLUCOSE 86 08/26/2018 0850   GLUCOSE 77 07/23/2017 1606   BUN 13 08/26/2018 0850   CREATININE 0.61 08/26/2018 0850   CALCIUM 9.1 08/26/2018 0850   GFRNONAA 118 08/26/2018 0850   GFRAA 136 08/26/2018 0850   Lab Results  Component Value Date   HGBA1C 5.5 08/26/2018   HGBA1C 5.7 (H) 05/08/2018   HGBA1C 5.6 01/06/2018   HGBA1C 6.0 (H) 10/02/2017   HGBA1C 6.1 07/23/2017   Lab Results  Component Value Date   INSULIN 14.2 08/26/2018   INSULIN 3.5 05/08/2018   INSULIN 10.1 01/06/2018   INSULIN 9.8 10/02/2017   CBC    Component Value Date/Time   WBC 13.1 (H) 10/02/2017 1245   WBC 14.4 (H) 07/23/2017 1606   RBC 4.53 10/02/2017 1245   RBC 4.42 07/23/2017 1606   HGB 11.2 10/02/2017 1245   HCT 36.9 10/02/2017 1245   PLT 412.0 (H) 07/23/2017 1606   MCV 82 10/02/2017 1245   MCH 24.7 (L) 10/02/2017 1245   MCH 29.2 11/12/2014 0536   MCHC 30.4 (L) 10/02/2017 1245   MCHC 31.6 07/23/2017 1606   RDW 17.4 (H) 10/02/2017 1245   LYMPHSABS 3.9 (H) 10/02/2017 1245   MONOABS 1.1 (H) 07/23/2017 1606   EOSABS 0.2 10/02/2017 1245   BASOSABS 0.0 10/02/2017 1245   Iron/TIBC/Ferritin/ %Sat    Component Value Date/Time   IRON 114 07/29/2013 0825   FERRITIN 14.8 07/29/2013 0825   IRONPCTSAT 36.8 07/29/2013 0825   Lipid Panel     Component Value Date/Time    CHOL 150 08/26/2018 0850   TRIG 40 08/26/2018 0850   HDL 44 08/26/2018 0850   CHOLHDL 3 07/23/2017 1606   VLDL 9.4 07/23/2017 1606   LDLCALC 98 08/26/2018 0850   Hepatic Function Panel     Component Value Date/Time   PROT 7.2 08/26/2018 0850   ALBUMIN 3.5 (L) 08/26/2018 0850   AST 12 08/26/2018 0850   ALT 10 08/26/2018 0850   ALKPHOS 87 08/26/2018 0850   BILITOT 0.2 08/26/2018 0850   BILIDIR 0.1 05/29/2016 0938      Component Value Date/Time   TSH 1.130 10/02/2017 1245   TSH 1.10 07/23/2017 1606   TSH 1.86 05/29/2016 0938   Results for MARGARUITE, TOP (MRN [id number]) as of 10/16/2018 08:14  Ref. Range 08/26/2018 08:50  Vitamin D, 25-Hydroxy Latest Ref Range: 30.0 - 100.0 ng/mL 50.1    I, Michaelene Song, am acting as Location manager for [employer], PA-C I, Abby Potash, PA-C have reviewed above note and agree with its content

## 2018-10-29 ENCOUNTER — Encounter (INDEPENDENT_AMBULATORY_CARE_PROVIDER_SITE_OTHER): Payer: Self-pay | Admitting: Physician Assistant

## 2018-10-29 ENCOUNTER — Other Ambulatory Visit: Payer: Self-pay

## 2018-10-29 ENCOUNTER — Ambulatory Visit (INDEPENDENT_AMBULATORY_CARE_PROVIDER_SITE_OTHER): Payer: BC Managed Care – PPO | Admitting: Physician Assistant

## 2018-10-29 DIAGNOSIS — Z6841 Body Mass Index (BMI) 40.0 and over, adult: Secondary | ICD-10-CM | POA: Diagnosis not present

## 2018-10-29 DIAGNOSIS — E559 Vitamin D deficiency, unspecified: Secondary | ICD-10-CM | POA: Diagnosis not present

## 2018-10-29 NOTE — Progress Notes (Signed)
Office: [phone]  /  Fax: [phone] TeleHealth Visit:  [name] has verbally consented to this TeleHealth visit today. The patient is located at home, the provider is located at the [employer] [facility] office. The participants in this visit include the listed provider and patient. The visit was conducted today via webex.  HPI:   Chief Complaint: OBESITY [name] is here to discuss her progress with her obesity treatment plan. She is on the Category 3 plan and is following her eating plan approximately 98 % of the time. She states she is exercising 0 minutes 0 times per week. [name] reports that she has been doing well on the plan. She is not currently working out, but states she will start again soon. She states her weight is 216 lbs this morning.  We were unable to weigh the patient today for this TeleHealth visit. She feels as if she has maintained her weight since her last visit. She has lost 62 lbs since starting treatment with [name].  Vitamin D Deficiency [name] has a diagnosis of vitamin D deficiency. She is currently taking prescription Vit D. She denies nausea, vomiting or muscle weakness.  ASSESSMENT AND PLAN:  Vitamin D deficiency  Class 3 severe obesity with serious comorbidity and body mass index (BMI) of 40.0 to 44.9 in adult, unspecified obesity type ([place])  PLAN:  Vitamin D Deficiency [name] was informed that low vitamin D levels contributes to fatigue and are associated with obesity, breast, and colon cancer. [name] agrees to continue taking prescription Vit D @50 ,000 IU every 14 days and will follow up for routine testing of vitamin D, at least 2-3 times per year. She was informed of the risk of over-replacement of vitamin D and agrees to not increase her dose unless she discusses this with [name] first. [name] agrees to follow up with our clinic in 2 weeks.  Obesity [name] is currently in the action stage of change. As such, her goal is to continue with weight  loss efforts She has agreed to follow the Category 3 plan [name] has been instructed to work up to a goal of 150 minutes of combined cardio and strengthening exercise per week for weight loss and overall health benefits. We discussed the following Behavioral Modification Strategies today: work on meal planning and easy cooking plans and planning for success   [name] has agreed to follow up with our clinic in 2 weeks. She was informed of the importance of frequent follow up visits to maximize her success with intensive lifestyle modifications for her multiple health conditions.  ALLERGIES: No Known Allergies  MEDICATIONS: Current Outpatient Medications on File Prior to Visit  Medication Sig Dispense Refill  . fexofenadine (ALLEGRA) 180 MG tablet Take 180 mg by mouth daily.    . InFLIXimab (REMICADE IV) IV infusion every 2 months    . letrozole (FEMARA) 2.5 MG tablet Take 1 tablet by mouth daily.    . metFORMIN (GLUCOPHAGE) 500 MG tablet Take 1 tablet (500 mg total) by mouth daily with breakfast. 30 tablet 0  . Prenatal Vit-Fe Fumarate-FA (PRENATAL PO) Take 1 tablet by mouth daily.    . Vitamin D, Ergocalciferol, (DRISDOL) 1.25 MG (50000 UT) CAPS capsule Take 1 capsule (50,000 Units total) by mouth every 14 (fourteen) days. 2 capsule 0   No current facility-administered medications on file prior to visit.     PAST MEDICAL HISTORY: Past Medical History:  Diagnosis Date  . Abrasion of upper arm with infection, left, sequela  started antibiotics lef arm oct 4th started antibiotics healing well  . Anemia    hx of 2 yrs ago  . Chicken pox   . Chronic rhinitis   . Contact dermatitis and other eczema, due to unspecified cause   . Crohn's 11-15-11   no issues in 2 yrs  . Fibroids 11-15-11   remains with fibroids  . Hernia   . Incisional hernia, RLQ 10/02/2011  . Infertility, female   . Mononucleosis   . Obesity   . Pilonidal abscess   . Rectal abscess   . Regional enteritis of small  intestine ([name])   . Vitamin D deficiency     PAST SURGICAL HISTORY: Past Surgical History:  Procedure Laterality Date  . APPENDECTOMY  7/06  . COLONOSCOPY WITH PROPOFOL N/A 03/07/2017   Procedure: COLONOSCOPY WITH PROPOFOL;  Surgeon: Milus Banister, MD;  Location: [facility];  Service: Endoscopy;  Laterality: N/A;  . EYE SURGERY  11-15-11   2003-multiple stys  . ileocecal resection  1/11   crohns disease with fisula/stricture. Dr. Ronnald Collum  . KNEE ARTHROSCOPY  99, 02   Bil.  [name] MYOMECTOMY N/A 11/10/2014   Procedure: MYOMECTOMY;  Surgeon: Everett Graff, MD;  Location: [facility];  Service: Gynecology;  Laterality: N/A;  . PILONIDAL CYST EXCISION  2011, 2012   I&Ds  . VENTRAL HERNIA REPAIR  11/27/2011   Procedure: LAPAROSCOPIC VENTRAL HERNIA;  Surgeon: Adin Hector, MD;  Location: [facility];  Service: General;  Laterality: N/A;  Laparoscopic Exploration & Repair of Hernia in Abdomen  . WISDOM TOOTH EXTRACTION      SOCIAL HISTORY: Social History   Tobacco Use  . Smoking status: Never Smoker  . Smokeless tobacco: Never Used  Substance Use Topics  . Alcohol use: No  . Drug use: No    FAMILY HISTORY: Family History  Problem Relation Age of Onset  . Aneurysm Mother        brain  . Heart disease Mother 54       heart anyrism  . Healthy Father        fairly  . Diabetes Sister        half sister  . Bipolar disorder Sister        half sister  . Colon cancer Maternal Grandmother   . Stomach cancer Paternal Grandfather   . Multiple sclerosis Sister     ROS: Review of Systems  Constitutional: Negative for weight loss.  Gastrointestinal: Negative for nausea and vomiting.  Musculoskeletal:       Negative muscle weakness    PHYSICAL EXAM: Pt in no acute distress  RECENT LABS AND TESTS: BMET    Component Value Date/Time   NA 138 08/26/2018 0850   K 4.2 08/26/2018 0850   CL 99 08/26/2018 0850   CO2 23 08/26/2018 0850   GLUCOSE 86 08/26/2018 0850   GLUCOSE 77  07/23/2017 1606   BUN 13 08/26/2018 0850   CREATININE 0.61 08/26/2018 0850   CALCIUM 9.1 08/26/2018 0850   GFRNONAA 118 08/26/2018 0850   GFRAA 136 08/26/2018 0850   Lab Results  Component Value Date   HGBA1C 5.5 08/26/2018   HGBA1C 5.7 (H) 05/08/2018   HGBA1C 5.6 01/06/2018   HGBA1C 6.0 (H) 10/02/2017   HGBA1C 6.1 07/23/2017   Lab Results  Component Value Date   INSULIN 14.2 08/26/2018   INSULIN 3.5 05/08/2018   INSULIN 10.1 01/06/2018   INSULIN 9.8 10/02/2017   CBC    Component Value  Date/Time   WBC 13.1 (H) 10/02/2017 1245   WBC 14.4 (H) 07/23/2017 1606   RBC 4.53 10/02/2017 1245   RBC 4.42 07/23/2017 1606   HGB 11.2 10/02/2017 1245   HCT 36.9 10/02/2017 1245   PLT 412.0 (H) 07/23/2017 1606   MCV 82 10/02/2017 1245   MCH 24.7 (L) 10/02/2017 1245   MCH 29.2 11/12/2014 0536   MCHC 30.4 (L) 10/02/2017 1245   MCHC 31.6 07/23/2017 1606   RDW 17.4 (H) 10/02/2017 1245   LYMPHSABS 3.9 (H) 10/02/2017 1245   MONOABS 1.1 (H) 07/23/2017 1606   EOSABS 0.2 10/02/2017 1245   BASOSABS 0.0 10/02/2017 1245   Iron/TIBC/Ferritin/ %Sat    Component Value Date/Time   IRON 114 07/29/2013 0825   FERRITIN 14.8 07/29/2013 0825   IRONPCTSAT 36.8 07/29/2013 0825   Lipid Panel     Component Value Date/Time   CHOL 150 08/26/2018 0850   TRIG 40 08/26/2018 0850   HDL 44 08/26/2018 0850   CHOLHDL 3 07/23/2017 1606   VLDL 9.4 07/23/2017 1606   LDLCALC 98 08/26/2018 0850   Hepatic Function Panel     Component Value Date/Time   PROT 7.2 08/26/2018 0850   ALBUMIN 3.5 (L) 08/26/2018 0850   AST 12 08/26/2018 0850   ALT 10 08/26/2018 0850   ALKPHOS 87 08/26/2018 0850   BILITOT 0.2 08/26/2018 0850   BILIDIR 0.1 05/29/2016 0938      Component Value Date/Time   TSH 1.130 10/02/2017 1245   TSH 1.10 07/23/2017 1606   TSH 1.86 05/29/2016 0938      I, Trixie Dredge, am acting as transcriptionist for Abby Potash, PA-C I, Abby Potash, PA-C have reviewed above note and agree  with its content

## 2018-11-10 ENCOUNTER — Encounter (INDEPENDENT_AMBULATORY_CARE_PROVIDER_SITE_OTHER): Payer: Self-pay

## 2018-11-11 ENCOUNTER — Telehealth (INDEPENDENT_AMBULATORY_CARE_PROVIDER_SITE_OTHER): Payer: BC Managed Care – PPO | Admitting: Physician Assistant

## 2018-11-11 ENCOUNTER — Ambulatory Visit: Payer: Self-pay | Admitting: Surgery

## 2018-11-11 ENCOUNTER — Other Ambulatory Visit: Payer: Self-pay

## 2018-11-11 DIAGNOSIS — E66813 Obesity, class 3: Secondary | ICD-10-CM

## 2018-11-11 DIAGNOSIS — R7303 Prediabetes: Secondary | ICD-10-CM | POA: Diagnosis not present

## 2018-11-11 DIAGNOSIS — E559 Vitamin D deficiency, unspecified: Secondary | ICD-10-CM

## 2018-11-11 DIAGNOSIS — Z6841 Body Mass Index (BMI) 40.0 and over, adult: Secondary | ICD-10-CM | POA: Diagnosis not present

## 2018-11-12 NOTE — Progress Notes (Signed)
Office: [phone]  /  Fax: [phone] TeleHealth Visit:  [name] has verbally consented to this TeleHealth visit today. The patient is located at home, the provider is located at the [employer] [facility] office. The participants in this visit include the listed provider and patient. The visit was conducted today via Webex.  HPI:   Chief Complaint: OBESITY [name] is here to discuss her progress with her obesity treatment plan. She is on the Category 3 plan and is following her eating plan approximately 95% of the time. She states she is walking/Zumba/weight training 20 minutes 4 times per week. [name] reports her most recent weight to be 216 lbs. She is following the plan closely and exercising regularly.  We were unable to weigh the patient today for this TeleHealth visit. She feels as if she has maintained her weight since her last visit. She has lost 62 lbs since starting treatment with [name].  Vitamin D deficiency [name] has a diagnosis of Vitamin D deficiency. She is currently taking prescription Vit D and denies nausea, vomiting or muscle weakness.  Pre-Diabetes [name] has a diagnosis of prediabetes based on her elevated Hgb A1c and was informed this puts her at greater risk of developing diabetes. She is taking metformin currently and continues to work on diet and exercise to decrease risk of diabetes. She denies nausea, vomiting, or diarrhea. No polyphagia.  ASSESSMENT AND PLAN:  No diagnosis found.  PLAN:  Vitamin D Deficiency [name] was informed that low Vitamin D levels contributes to fatigue and are associated with obesity, breast, and colon cancer. She agrees to continue to take prescription Vit D @ 50,000 IU every 14 days #2 with 0 refills and will follow-up for routine testing of Vitamin D, at least 2-3 times per year. She was informed of the risk of over-replacement of Vitamin D and agrees to not increase her dose unless she discusses this with [name] first. [name]  agrees to follow-up with our clinic in 2 weeks.  Pre-Diabetes [name] will continue to work on weight loss, exercise, and decreasing simple carbohydrates in her diet to help decrease the risk of diabetes. We discussed metformin including benefits and risks. She was informed that eating too many simple carbohydrates or too many calories at one sitting increases the likelihood of GI side effects. [name] was given a refill on her metformin #30 with 0 refills and agrees to follow-up with our clinic in 2 weeks.  Obesity [name] is currently in the action stage of change. As such, her goal is to continue with weight loss efforts. She has agreed to follow the Category 3 plan and journal 1500 calories + 95 grams of protein. [name] has been instructed to work up to a goal of 150 minutes of combined cardio and strengthening exercise per week for weight loss and overall health benefits. We discussed the following Behavioral Modification Strategies today: work on meal planning, easy cooking plans, and keep a strict food journal.  [name] has agreed to follow-up with our clinic in 2 weeks. She was informed of the importance of frequent follow-up visits to maximize her success with intensive lifestyle modifications for her multiple health conditions.  ALLERGIES: No Known Allergies  MEDICATIONS: Current Outpatient Medications on File Prior to Visit  Medication Sig Dispense Refill   fexofenadine (ALLEGRA) 180 MG tablet Take 180 mg by mouth daily.     InFLIXimab (REMICADE IV) IV infusion every 2 months     letrozole (FEMARA) 2.5 MG tablet Take 1 tablet  by mouth daily.     metFORMIN (GLUCOPHAGE) 500 MG tablet Take 1 tablet (500 mg total) by mouth daily with breakfast. 30 tablet 0   Prenatal Vit-Fe Fumarate-FA (PRENATAL PO) Take 1 tablet by mouth daily.     Vitamin D, Ergocalciferol, (DRISDOL) 1.25 MG (50000 UT) CAPS capsule Take 1 capsule (50,000 Units total) by mouth every 14 (fourteen) days. 2 capsule 0     No current facility-administered medications on file prior to visit.     PAST MEDICAL HISTORY: Past Medical History:  Diagnosis Date   Abrasion of upper arm with infection, left, sequela    started antibiotics lef arm oct 4th started antibiotics healing well   Anemia    hx of 2 yrs ago   Chicken pox    Chronic rhinitis    Contact dermatitis and other eczema, due to unspecified cause    Crohn's 11-15-11   no issues in 2 yrs   Fibroids 11-15-11   remains with fibroids   Hernia    Incisional hernia, RLQ 10/02/2011   Infertility, female    Mononucleosis    Obesity    Pilonidal abscess    Rectal abscess    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[place])    Vitamin D deficiency     PAST SURGICAL HISTORY: Past Surgical History:  Procedure Laterality Date   APPENDECTOMY  7/06   COLONOSCOPY WITH PROPOFOL N/A 03/07/2017   Procedure: COLONOSCOPY WITH PROPOFOL;  Surgeon: Milus Banister, MD;  Location: [facility];  Service: Endoscopy;  Laterality: N/A;   EYE SURGERY  11-15-11   2003-multiple stys   ileocecal resection  1/11   crohns disease with fisula/stricture. Dr. Ronnald Collum   KNEE ARTHROSCOPY  99, 02   Bil.   MYOMECTOMY N/A 11/10/2014   Procedure: MYOMECTOMY;  Surgeon: Everett Graff, MD;  Location: [facility];  Service: Gynecology;  Laterality: N/A;   PILONIDAL CYST EXCISION  2011, 2012   I&Ds   VENTRAL HERNIA REPAIR  11/27/2011   Procedure: LAPAROSCOPIC VENTRAL HERNIA;  Surgeon: Adin Hector, MD;  Location: [facility];  Service: General;  Laterality: N/A;  Laparoscopic Exploration & Repair of Hernia in Abdomen   WISDOM TOOTH EXTRACTION      SOCIAL HISTORY: Social History   Tobacco Use   Smoking status: Never Smoker   Smokeless tobacco: Never Used  Substance Use Topics   Alcohol use: No   Drug use: No    FAMILY HISTORY: Family History  Problem Relation Age of Onset   Aneurysm Mother        brain   Heart disease Mother 48       heart  anyrism   Healthy Father        fairly   Diabetes Sister        half sister   Bipolar disorder Sister        half sister   Colon cancer Maternal Grandmother    Stomach cancer Paternal Grandfather    Multiple sclerosis Sister    ROS: Review of Systems  Gastrointestinal: Negative for diarrhea, nausea and vomiting.  Musculoskeletal:       Negative for muscle weakness.  Endo/Heme/Allergies:       Negative for polyphagia.   PHYSICAL EXAM: Pt in no acute distress  RECENT LABS AND TESTS: BMET    Component Value Date/Time   NA 138 08/26/2018 0850   K 4.2 08/26/2018 0850   CL 99 08/26/2018 0850   CO2 23 08/26/2018 0850  GLUCOSE 86 08/26/2018 0850   GLUCOSE 77 07/23/2017 1606   BUN 13 08/26/2018 0850   CREATININE 0.61 08/26/2018 0850   CALCIUM 9.1 08/26/2018 0850   GFRNONAA 118 08/26/2018 0850   GFRAA 136 08/26/2018 0850   Lab Results  Component Value Date   HGBA1C 5.5 08/26/2018   HGBA1C 5.7 (H) 05/08/2018   HGBA1C 5.6 01/06/2018   HGBA1C 6.0 (H) 10/02/2017   HGBA1C 6.1 07/23/2017   Lab Results  Component Value Date   INSULIN 14.2 08/26/2018   INSULIN 3.5 05/08/2018   INSULIN 10.1 01/06/2018   INSULIN 9.8 10/02/2017   CBC    Component Value Date/Time   WBC 13.1 (H) 10/02/2017 1245   WBC 14.4 (H) 07/23/2017 1606   RBC 4.53 10/02/2017 1245   RBC 4.42 07/23/2017 1606   HGB 11.2 10/02/2017 1245   HCT 36.9 10/02/2017 1245   PLT 412.0 (H) 07/23/2017 1606   MCV 82 10/02/2017 1245   MCH 24.7 (L) 10/02/2017 1245   MCH 29.2 11/12/2014 0536   MCHC 30.4 (L) 10/02/2017 1245   MCHC 31.6 07/23/2017 1606   RDW 17.4 (H) 10/02/2017 1245   LYMPHSABS 3.9 (H) 10/02/2017 1245   MONOABS 1.1 (H) 07/23/2017 1606   EOSABS 0.2 10/02/2017 1245   BASOSABS 0.0 10/02/2017 1245   Iron/TIBC/Ferritin/ %Sat    Component Value Date/Time   IRON 114 07/29/2013 0825   FERRITIN 14.8 07/29/2013 0825   IRONPCTSAT 36.8 07/29/2013 0825   Lipid Panel     Component Value Date/Time     CHOL 150 08/26/2018 0850   TRIG 40 08/26/2018 0850   HDL 44 08/26/2018 0850   CHOLHDL 3 07/23/2017 1606   VLDL 9.4 07/23/2017 1606   LDLCALC 98 08/26/2018 0850   Hepatic Function Panel     Component Value Date/Time   PROT 7.2 08/26/2018 0850   ALBUMIN 3.5 (L) 08/26/2018 0850   AST 12 08/26/2018 0850   ALT 10 08/26/2018 0850   ALKPHOS 87 08/26/2018 0850   BILITOT 0.2 08/26/2018 0850   BILIDIR 0.1 05/29/2016 0938      Component Value Date/Time   TSH 1.130 10/02/2017 1245   TSH 1.10 07/23/2017 1606   TSH 1.86 05/29/2016 0938   Results for KRISTINIA, LEAVY (MRN [id number]) as of 11/12/2018 16:31  Ref. Range 08/26/2018 08:50  Vitamin D, 25-Hydroxy Latest Ref Range: 30.0 - 100.0 ng/mL 50.1    I, Michaelene Song, am acting as Location manager for [employer], PA-C I, Abby Potash, PA-C have reviewed above note and agree with its content

## 2018-11-13 MED ORDER — METFORMIN HCL 500 MG PO TABS: 500.0000 mg | ORAL_TABLET | Freq: Every day | ORAL | 0 refills | Status: DC

## 2018-11-13 MED ORDER — VITAMIN D (ERGOCALCIFEROL) 1.25 MG (50000 UNIT) PO CAPS: 50000.0000 [IU] | ORAL_CAPSULE | ORAL | 2 refills | Status: DC

## 2018-11-26 ENCOUNTER — Other Ambulatory Visit: Payer: Self-pay

## 2018-11-26 ENCOUNTER — Encounter (INDEPENDENT_AMBULATORY_CARE_PROVIDER_SITE_OTHER): Payer: Self-pay | Admitting: Physician Assistant

## 2018-11-26 ENCOUNTER — Ambulatory Visit (INDEPENDENT_AMBULATORY_CARE_PROVIDER_SITE_OTHER): Payer: BC Managed Care – PPO | Admitting: Physician Assistant

## 2018-11-26 VITALS — BP 94/66 | HR 81 | Temp 98.1°F | Ht 63.0 in | Wt 214.0 lb

## 2018-11-26 DIAGNOSIS — E559 Vitamin D deficiency, unspecified: Secondary | ICD-10-CM

## 2018-11-26 DIAGNOSIS — Z6838 Body mass index (BMI) 38.0-38.9, adult: Secondary | ICD-10-CM

## 2018-11-26 NOTE — Progress Notes (Signed)
Office: [phone]  /  Fax: [phone]   HPI:   TIME: 25 minutes.  Chief Complaint: OBESITY [name] is here to discuss her progress with her obesity treatment plan. She is on the Category 3 plan and is following her eating plan approximately 93% of the time. She states she is doing resistance/cardio 30 minutes 5 times per week. [name] reports following the plan except recently for her anniversary. She has done well overall with the plan and reports no struggles. Her weight is 214 lb (97.1 kg) today and has had a weight loss of 17 pounds over a period of 4 months since her last in office visit. She has lost 79 lbs since starting treatment with [name].  Vitamin D deficiency [name] has a diagnosis of Vitamin D deficiency. She is currently taking prescription Vit D every other week and denies nausea, vomiting or muscle weakness.  ASSESSMENT AND PLAN:  Vitamin D deficiency  Class 2 severe obesity with serious comorbidity and body mass index (BMI) of 38.0 to 38.9 in adult, unspecified obesity type ([place])  PLAN:  Vitamin D Deficiency [name] was informed that low Vitamin D levels contributes to fatigue and are associated with obesity, breast, and colon cancer. She agrees to continue taking prescription Vit D every other week and will follow-up for routine testing of Vitamin D, at least 2-3 times per year. She was informed of the risk of over-replacement of Vitamin D and agrees to not increase her dose unless she discusses this with [name] first. [name] agrees to follow-up with our clinic in 2 weeks.  I spent > than 50% of the 25 minute visit on counseling as documented in the note.  Obesity [name] is currently in the action stage of change. As such, her goal is to continue with weight loss efforts. She has agreed to follow the Category 3 plan and journal 1500 calories + 100 grams of protein daily. [name] will have IC check at her next visit. [name] has been instructed to work up to a goal of  150 minutes of combined cardio and strengthening exercise per week for weight loss and overall health benefits. We discussed the following Behavioral Modification Strategies today: work on meal planning, easy cooking plans, and keeping healthy foods in the home.  [name] has agreed to follow-up with our clinic in 2 weeks. She was informed of the importance of frequent follow-up visits to maximize her success with intensive lifestyle modifications for her multiple health conditions.  I spent > than 50% of the 25 minute visit on counseling as documented in the note.   ALLERGIES: No Known Allergies  MEDICATIONS: Current Outpatient Medications on File Prior to Visit  Medication Sig Dispense Refill   fexofenadine (ALLEGRA) 180 MG tablet Take 180 mg by mouth daily.     InFLIXimab (REMICADE IV) IV infusion every 2 months     letrozole (FEMARA) 2.5 MG tablet Take 1 tablet by mouth daily.     metFORMIN (GLUCOPHAGE) 500 MG tablet Take 1 tablet (500 mg total) by mouth daily with breakfast. 30 tablet 0   Prenatal Vit-Fe Fumarate-FA (PRENATAL PO) Take 1 tablet by mouth daily.     Vitamin D, Ergocalciferol, (DRISDOL) 1.25 MG (50000 UT) CAPS capsule Take 1 capsule (50,000 Units total) by mouth every 14 (fourteen) days. 2 capsule 2   No current facility-administered medications on file prior to visit.     PAST MEDICAL HISTORY: Past Medical History:  Diagnosis Date   Abrasion of upper arm with infection,  left, sequela    started antibiotics lef arm oct 4th started antibiotics healing well   Anemia    hx of 2 yrs ago   Chicken pox    Chronic rhinitis    Contact dermatitis and other eczema, due to unspecified cause    Crohn's 11-15-11   no issues in 2 yrs   Fibroids 11-15-11   remains with fibroids   Hernia    Incisional hernia, RLQ 10/02/2011   Infertility, female    Mononucleosis    Obesity    Pilonidal abscess    Rectal abscess    Regional enteritis of small intestine  ([place])    Vitamin D deficiency     PAST SURGICAL HISTORY: Past Surgical History:  Procedure Laterality Date   APPENDECTOMY  7/06   COLONOSCOPY WITH PROPOFOL N/A 03/07/2017   Procedure: COLONOSCOPY WITH PROPOFOL;  Surgeon: Milus Banister, MD;  Location: [facility];  Service: Endoscopy;  Laterality: N/A;   EYE SURGERY  11-15-11   2003-multiple stys   ileocecal resection  1/11   crohns disease with fisula/stricture. Dr. Ronnald Collum   KNEE ARTHROSCOPY  99, 02   Bil.   MYOMECTOMY N/A 11/10/2014   Procedure: MYOMECTOMY;  Surgeon: Everett Graff, MD;  Location: [facility];  Service: Gynecology;  Laterality: N/A;   PILONIDAL CYST EXCISION  2011, 2012   I&Ds   VENTRAL HERNIA REPAIR  11/27/2011   Procedure: LAPAROSCOPIC VENTRAL HERNIA;  Surgeon: Adin Hector, MD;  Location: [facility];  Service: General;  Laterality: N/A;  Laparoscopic Exploration & Repair of Hernia in Abdomen   WISDOM TOOTH EXTRACTION      SOCIAL HISTORY: Social History   Tobacco Use   Smoking status: Never Smoker   Smokeless tobacco: Never Used  Substance Use Topics   Alcohol use: No   Drug use: No    FAMILY HISTORY: Family History  Problem Relation Age of Onset   Aneurysm Mother        brain   Heart disease Mother 57       heart anyrism   Healthy Father        fairly   Diabetes Sister        half sister   Bipolar disorder Sister        half sister   Colon cancer Maternal Grandmother    Stomach cancer Paternal Grandfather    Multiple sclerosis Sister    ROS: Review of Systems  Gastrointestinal: Negative for nausea and vomiting.  Musculoskeletal:       Negative for muscle weakness.   PHYSICAL EXAM: Blood pressure 94/66, pulse 81, temperature 98.1 F (36.7 C), height 5' 3"  (1.6 m), weight 214 lb (97.1 kg), SpO2 98 %. Body mass index is 37.91 kg/m. Physical Exam Vitals signs reviewed.  Constitutional:      Appearance: Normal appearance. She is obese.  Cardiovascular:     Rate  and Rhythm: Normal rate.     Pulses: Normal pulses.  Pulmonary:     Effort: Pulmonary effort is normal.     Breath sounds: Normal breath sounds.  Musculoskeletal: Normal range of motion.  Skin:    General: Skin is warm and dry.  Neurological:     Mental Status: She is alert and oriented to person, place, and time.  Psychiatric:        Behavior: Behavior normal.   RECENT LABS AND TESTS: BMET    Component Value Date/Time   NA 138 08/26/2018 0850   K 4.2 08/26/2018  0850   CL 99 08/26/2018 0850   CO2 23 08/26/2018 0850   GLUCOSE 86 08/26/2018 0850   GLUCOSE 77 07/23/2017 1606   BUN 13 08/26/2018 0850   CREATININE 0.61 08/26/2018 0850   CALCIUM 9.1 08/26/2018 0850   GFRNONAA 118 08/26/2018 0850   GFRAA 136 08/26/2018 0850   Lab Results  Component Value Date   HGBA1C 5.5 08/26/2018   HGBA1C 5.7 (H) 05/08/2018   HGBA1C 5.6 01/06/2018   HGBA1C 6.0 (H) 10/02/2017   HGBA1C 6.1 07/23/2017   Lab Results  Component Value Date   INSULIN 14.2 08/26/2018   INSULIN 3.5 05/08/2018   INSULIN 10.1 01/06/2018   INSULIN 9.8 10/02/2017   CBC    Component Value Date/Time   WBC 13.1 (H) 10/02/2017 1245   WBC 14.4 (H) 07/23/2017 1606   RBC 4.53 10/02/2017 1245   RBC 4.42 07/23/2017 1606   HGB 11.2 10/02/2017 1245   HCT 36.9 10/02/2017 1245   PLT 412.0 (H) 07/23/2017 1606   MCV 82 10/02/2017 1245   MCH 24.7 (L) 10/02/2017 1245   MCH 29.2 11/12/2014 0536   MCHC 30.4 (L) 10/02/2017 1245   MCHC 31.6 07/23/2017 1606   RDW 17.4 (H) 10/02/2017 1245   LYMPHSABS 3.9 (H) 10/02/2017 1245   MONOABS 1.1 (H) 07/23/2017 1606   EOSABS 0.2 10/02/2017 1245   BASOSABS 0.0 10/02/2017 1245   Iron/TIBC/Ferritin/ %Sat    Component Value Date/Time   IRON 114 07/29/2013 0825   FERRITIN 14.8 07/29/2013 0825   IRONPCTSAT 36.8 07/29/2013 0825   Lipid Panel     Component Value Date/Time   CHOL 150 08/26/2018 0850   TRIG 40 08/26/2018 0850   HDL 44 08/26/2018 0850   CHOLHDL 3 07/23/2017 1606    VLDL 9.4 07/23/2017 1606   LDLCALC 98 08/26/2018 0850   Hepatic Function Panel     Component Value Date/Time   PROT 7.2 08/26/2018 0850   ALBUMIN 3.5 (L) 08/26/2018 0850   AST 12 08/26/2018 0850   ALT 10 08/26/2018 0850   ALKPHOS 87 08/26/2018 0850   BILITOT 0.2 08/26/2018 0850   BILIDIR 0.1 05/29/2016 0938      Component Value Date/Time   TSH 1.130 10/02/2017 1245   TSH 1.10 07/23/2017 1606   TSH 1.86 05/29/2016 0938   Results for PEARLA, MCKINNY (MRN [id number]) as of 11/26/2018 16:04  Ref. Range 08/26/2018 08:50  Vitamin D, 25-Hydroxy Latest Ref Range: 30.0 - 100.0 ng/mL 50.1   OBESITY BEHAVIORAL INTERVENTION VISIT  Today's visit was #20  Starting weight: 293 lbs Starting date: 10/02/2017 Today's weight: 214 lbs  Today's date: 11/26/2018 Total lbs lost to date: 79    11/26/2018  Height 5' 3"  (1.6 m)  Weight 214 lb (97.1 kg)  BMI (Calculated) 37.92  BLOOD PRESSURE - SYSTOLIC 94  BLOOD PRESSURE - DIASTOLIC 66   Body Fat % 63.1 %  Total Body Water (lbs) 90.21 lbs   ASK: We discussed the diagnosis of obesity with Standley Dakins today and Natalynn agreed to give [name] permission to discuss obesity behavioral modification therapy today.  ASSESS: Hyun has the diagnosis of obesity and her BMI today is 38.0. Haylyn is in the action stage of change.   ADVISE: Cipriana was educated on the multiple health risks of obesity as well as the benefit of weight loss to improve her health. She was advised of the need for long term treatment and the importance of lifestyle modifications to improve her current health and to decrease  her risk of future health problems.  AGREE: Multiple dietary modification options and treatment options were discussed and  Paizleigh agreed to follow the recommendations documented in the above note.  ARRANGE: Lacye was educated on the importance of frequent visits to treat obesity as outlined per CMS and USPSTF guidelines and agreed to schedule her next follow up  appointment today.  Migdalia Dk, am acting as transcriptionist for Abby Potash, PA-C I, Abby Potash, PA-C have reviewed above note and agree with its content

## 2018-12-17 ENCOUNTER — Other Ambulatory Visit: Payer: Self-pay

## 2018-12-17 ENCOUNTER — Ambulatory Visit (INDEPENDENT_AMBULATORY_CARE_PROVIDER_SITE_OTHER): Payer: BC Managed Care – PPO | Admitting: Physician Assistant

## 2018-12-17 ENCOUNTER — Encounter (INDEPENDENT_AMBULATORY_CARE_PROVIDER_SITE_OTHER): Payer: Self-pay | Admitting: Physician Assistant

## 2018-12-17 VITALS — BP 109/75 | HR 82 | Temp 97.8°F | Ht 63.0 in | Wt 213.0 lb

## 2018-12-17 DIAGNOSIS — E559 Vitamin D deficiency, unspecified: Secondary | ICD-10-CM

## 2018-12-17 DIAGNOSIS — Z9189 Other specified personal risk factors, not elsewhere classified: Secondary | ICD-10-CM

## 2018-12-17 DIAGNOSIS — R0602 Shortness of breath: Secondary | ICD-10-CM

## 2018-12-17 DIAGNOSIS — R7303 Prediabetes: Secondary | ICD-10-CM | POA: Diagnosis not present

## 2018-12-17 DIAGNOSIS — Z6837 Body mass index (BMI) 37.0-37.9, adult: Secondary | ICD-10-CM

## 2018-12-17 MED ORDER — VITAMIN D (ERGOCALCIFEROL) 1.25 MG (50000 UNIT) PO CAPS: 50000.0000 [IU] | ORAL_CAPSULE | ORAL | 2 refills | Status: DC

## 2018-12-17 MED ORDER — METFORMIN HCL 500 MG PO TABS: 500.0000 mg | ORAL_TABLET | Freq: Every day | ORAL | 0 refills | Status: DC

## 2018-12-17 NOTE — Progress Notes (Signed)
Office: [phone]  /  Fax: [phone]   HPI:   Chief Complaint: OBESITY [name] is here to discuss her progress with her obesity treatment plan. She is on the Category 3 plan and is following her eating plan approximately 90% of the time. She states she is doing cardio 20-30 minutes 4 times per week. Tammatha reports that she is following the plan closely. She is exercising 4 days a week and is adding calories back to her day for the calories burned during exercise. She denies excessive hunger. Her weight is 213 lb (96.6 kg) today and has had a weight loss of 1 pound over a period of 3 weeks since her last visit. She has lost 80 lbs since starting treatment with [name].  Vitamin D deficiency [name] has a diagnosis of Vitamin D deficiency. She is currently taking prescription Vit D and denies nausea, vomiting or muscle weakness.  Pre-Diabetes [name] has a diagnosis of prediabetes based on her elevated Hgb A1c and was informed this puts her at greater risk of developing diabetes. She is taking metformin currently and continues to work on diet and exercise to decrease risk of diabetes. She denies nausea, vomiting, diarrhea, or polyphagia.  At risk for diabetes [name] is at higher than average risk for developing diabetes due to her obesity. She currently denies polyuria or polydipsia.  Shortness of Breath on Exertion [name] notes increasing shortness of breath with exertion. She denies dizziness or lightheadedness.   ASSESSMENT AND PLAN:  Vitamin D deficiency - Plan: VITAMIN D 25 Hydroxy (Vit-D Deficiency, Fractures), Vitamin D, Ergocalciferol, (DRISDOL) 1.25 MG (50000 UT) CAPS capsule  Prediabetes - Plan: Hemoglobin A1c, Insulin, random, Comprehensive metabolic panel, metFORMIN (GLUCOPHAGE) 500 MG tablet  Shortness of breath on exertion  At risk for diabetes mellitus  Class 2 severe obesity with serious comorbidity and body mass index (BMI) of 37.0 to 37.9 in adult, unspecified  obesity type ([place])  PLAN:  Vitamin D Deficiency [name] was informed that low Vitamin D levels contributes to fatigue and are associated with obesity, breast, and colon cancer. She agrees to continue to take prescription Vit D @ 50,000 IU every 14 days #2 with 0 refills and will follow-up for routine testing of Vitamin D, at least 2-3 times per year. She was informed of the risk of over-replacement of Vitamin D and agrees to not increase her dose unless she discusses this with [name] first. [name] agrees to follow-up with our clinic in 2-3 weeks.  Pre-Diabetes [name] will continue to work on weight loss, exercise, and decreasing simple carbohydrates in her diet to help decrease the risk of diabetes. We dicussed metformin including benefits and risks. She was informed that eating too many simple carbohydrates or too many calories at one sitting increases the likelihood of GI side effects. [name] was given a refill on her metformin #30 with 0 refills. She agrees to follow-up with our clinic in 2-3 weeks.  Diabetes risk counseling [name] was given extended (15 minutes) diabetes prevention counseling today. She is 38 y.o. female and has risk factors for diabetes including obesity. We discussed intensive lifestyle modifications today with an emphasis on weight loss as well as increasing exercise and decreasing simple carbohydrates in her diet.  Shortness of Breath on Exertion [name]'s shortness of breath appears to be obesity related and exercise induced. The indirect calorimeter results showed VO2 of 266 and a REE of 1849. She has agreed to work on weight loss and gradually increase exercise to treat her exercise  induced shortness of breath. If [name] follows our instructions and loses weight without improvement of her shortness of breath, we will plan to refer to pulmonology. [name] agrees to this plan.  Obesity [name] is currently in the action stage of change. As such, her goal is to continue with  weight loss efforts. She has agreed to follow the Category 3 plan. [name] has been instructed to work up to a goal of 150 minutes of combined cardio and strengthening exercise per week for weight loss and overall health benefits. We discussed the following Behavioral Modification Strategies today: work on meal planning and easy cooking plans, and keeping healthy foods in the home.  [name] has agreed to follow-up with our clinic in 2-3 weeks. She was informed of the importance of frequent follow-up visits to maximize her success with intensive lifestyle modifications for her multiple health conditions.  ALLERGIES: No Known Allergies  MEDICATIONS: Current Outpatient Medications on File Prior to Visit  Medication Sig Dispense Refill   Calcium Carb-Cholecalciferol (CALCIUM 600+D3 PO) Take 1 tablet by mouth daily.     fexofenadine (ALLEGRA) 180 MG tablet Take 180 mg by mouth daily.     ibuprofen (ADVIL) 200 MG tablet Take 200 mg by mouth every 6 (six) hours as needed for headache or moderate pain.     InFLIXimab (REMICADE IV) Inject into the vein every 8 (eight) weeks.      Multiple Vitamin (MULTIVITAMIN PO) Take 1 tablet by mouth daily.     tetrahydrozoline (VISINE) 0.05 % ophthalmic solution Place 2 drops into both eyes daily as needed (for dry eyes).     No current facility-administered medications on file prior to visit.     PAST MEDICAL HISTORY: Past Medical History:  Diagnosis Date   Abrasion of upper arm with infection, left, sequela    started antibiotics lef arm oct 4th started antibiotics healing well   Anemia    hx of 2 yrs ago   Chicken pox    Chronic rhinitis    Contact dermatitis and other eczema, due to unspecified cause    Crohn's 11-15-11   no issues in 2 yrs   Fibroids 11-15-11   remains with fibroids   Hernia    Incisional hernia, RLQ 10/02/2011   Infertility, female    Mononucleosis    Obesity    Pilonidal abscess    Rectal abscess     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[place])    Vitamin D deficiency     PAST SURGICAL HISTORY: Past Surgical History:  Procedure Laterality Date   APPENDECTOMY  7/06   COLONOSCOPY WITH PROPOFOL N/A 03/07/2017   Procedure: COLONOSCOPY WITH PROPOFOL;  Surgeon: Milus Banister, MD;  Location: [facility];  Service: Endoscopy;  Laterality: N/A;   EYE SURGERY  11-15-11   2003-multiple stys   ileocecal resection  1/11   crohns disease with fisula/stricture. Dr. Ronnald Collum   KNEE ARTHROSCOPY  99, 02   Bil.   MYOMECTOMY N/A 11/10/2014   Procedure: MYOMECTOMY;  Surgeon: Everett Graff, MD;  Location: [facility];  Service: Gynecology;  Laterality: N/A;   PILONIDAL CYST EXCISION  2011, 2012   I&Ds   VENTRAL HERNIA REPAIR  11/27/2011   Procedure: LAPAROSCOPIC VENTRAL HERNIA;  Surgeon: Adin Hector, MD;  Location: [facility];  Service: General;  Laterality: N/A;  Laparoscopic Exploration & Repair of Hernia in Abdomen   WISDOM TOOTH EXTRACTION      SOCIAL HISTORY: Social History   Tobacco Use  Smoking status: Never Smoker   Smokeless tobacco: Never Used  Substance Use Topics   Alcohol use: No   Drug use: No    FAMILY HISTORY: Family History  Problem Relation Age of Onset   Aneurysm Mother        brain   Heart disease Mother 40       heart anyrism   Healthy Father        fairly   Diabetes Sister        half sister   Bipolar disorder Sister        half sister   Colon cancer Maternal Grandmother    Stomach cancer Paternal Grandfather    Multiple sclerosis Sister    ROS: Review of Systems  Respiratory: Positive for shortness of breath (on exertion).   Gastrointestinal: Negative for diarrhea, nausea and vomiting.  Musculoskeletal:       Negative for muscle weakness.  Neurological: Negative for dizziness.       Negative for lightheadedness.  Endo/Heme/Allergies:       Negative for polyphagia.   PHYSICAL EXAM: Blood pressure 109/75, pulse 82, temperature  97.8 F (36.6 C), temperature source Oral, height 5' 3"  (1.6 m), weight 213 lb (96.6 kg), SpO2 95 %. Body mass index is 37.73 kg/m. Physical Exam Vitals signs reviewed.  Constitutional:      Appearance: Normal appearance. She is obese.  Cardiovascular:     Rate and Rhythm: Normal rate.     Pulses: Normal pulses.  Pulmonary:     Effort: Pulmonary effort is normal.     Breath sounds: Normal breath sounds.  Musculoskeletal: Normal range of motion.  Skin:    General: Skin is warm and dry.  Neurological:     Mental Status: She is alert and oriented to person, place, and time.  Psychiatric:        Behavior: Behavior normal.   RECENT LABS AND TESTS: BMET    Component Value Date/Time   NA 138 08/26/2018 0850   K 4.2 08/26/2018 0850   CL 99 08/26/2018 0850   CO2 23 08/26/2018 0850   GLUCOSE 86 08/26/2018 0850   GLUCOSE 77 07/23/2017 1606   BUN 13 08/26/2018 0850   CREATININE 0.61 08/26/2018 0850   CALCIUM 9.1 08/26/2018 0850   GFRNONAA 118 08/26/2018 0850   GFRAA 136 08/26/2018 0850   Lab Results  Component Value Date   HGBA1C 5.5 08/26/2018   HGBA1C 5.7 (H) 05/08/2018   HGBA1C 5.6 01/06/2018   HGBA1C 6.0 (H) 10/02/2017   HGBA1C 6.1 07/23/2017   Lab Results  Component Value Date   INSULIN 14.2 08/26/2018   INSULIN 3.5 05/08/2018   INSULIN 10.1 01/06/2018   INSULIN 9.8 10/02/2017   CBC    Component Value Date/Time   WBC 13.1 (H) 10/02/2017 1245   WBC 14.4 (H) 07/23/2017 1606   RBC 4.53 10/02/2017 1245   RBC 4.42 07/23/2017 1606   HGB 11.2 10/02/2017 1245   HCT 36.9 10/02/2017 1245   PLT 412.0 (H) 07/23/2017 1606   MCV 82 10/02/2017 1245   MCH 24.7 (L) 10/02/2017 1245   MCH 29.2 11/12/2014 0536   MCHC 30.4 (L) 10/02/2017 1245   MCHC 31.6 07/23/2017 1606   RDW 17.4 (H) 10/02/2017 1245   LYMPHSABS 3.9 (H) 10/02/2017 1245   MONOABS 1.1 (H) 07/23/2017 1606   EOSABS 0.2 10/02/2017 1245   BASOSABS 0.0 10/02/2017 1245   Iron/TIBC/Ferritin/ %Sat    Component  Value Date/Time   IRON 114 07/29/2013  0825   FERRITIN 14.8 07/29/2013 0825   IRONPCTSAT 36.8 07/29/2013 0825   Lipid Panel     Component Value Date/Time   CHOL 150 08/26/2018 0850   TRIG 40 08/26/2018 0850   HDL 44 08/26/2018 0850   CHOLHDL 3 07/23/2017 1606   VLDL 9.4 07/23/2017 1606   LDLCALC 98 08/26/2018 0850   Hepatic Function Panel     Component Value Date/Time   PROT 7.2 08/26/2018 0850   ALBUMIN 3.5 (L) 08/26/2018 0850   AST 12 08/26/2018 0850   ALT 10 08/26/2018 0850   ALKPHOS 87 08/26/2018 0850   BILITOT 0.2 08/26/2018 0850   BILIDIR 0.1 05/29/2016 0938      Component Value Date/Time   TSH 1.130 10/02/2017 1245   TSH 1.10 07/23/2017 1606   TSH 1.86 05/29/2016 0938   Results for SHANEEKA, SCARBORO (MRN [id number]) as of 12/17/2018 15:50  Ref. Range 08/26/2018 08:50  Vitamin D, 25-Hydroxy Latest Ref Range: 30.0 - 100.0 ng/mL 50.1   OBESITY BEHAVIORAL INTERVENTION VISIT  Today's visit was #22  Starting weight: 293 lbs Starting date: 10/02/2017 Today's weight: 213 lbs  Today's date: 12/17/2018 Total lbs lost to date: 80   12/17/2018  Height 5' 3"  (1.6 m)  Weight 213 lb (96.6 kg)  BMI (Calculated) 37.74  BLOOD PRESSURE - SYSTOLIC 217  BLOOD PRESSURE - DIASTOLIC 75   Body Fat % 47.1 %  Total Body Water (lbs) 88.6 lbs   ASK: We discussed the diagnosis of obesity with [name] today and Icess agreed to give [name] permission to discuss obesity behavioral modification therapy today.  ASSESS: Jazalyn has the diagnosis of obesity and her BMI today is 37.8. Reighlynn is in the action stage of change.   ADVISE: Oaklie was educated on the multiple health risks of obesity as well as the benefit of weight loss to improve her health. She was advised of the need for long term treatment and the importance of lifestyle modifications to improve her current health and to decrease her risk of future health problems.  AGREE: Multiple dietary modification options and  treatment options were discussed and  Avyanna agreed to follow the recommendations documented in the above note.  ARRANGE: Toniyah was educated on the importance of frequent visits to treat obesity as outlined per CMS and USPSTF guidelines and agreed to schedule her next follow up appointment today.  Migdalia Dk, am acting as transcriptionist for Abby Potash, PA-C I, Abby Potash, PA-C have reviewed above note and agree with its content

## 2018-12-18 LAB — COMPREHENSIVE METABOLIC PANEL
ALT: 11 IU/L (ref 0–32)
AST: 11 IU/L (ref 0–40)
Albumin/Globulin Ratio: 1 — ABNORMAL LOW (ref 1.2–2.2)
Albumin: 3.5 g/dL — ABNORMAL LOW (ref 3.8–4.8)
Alkaline Phosphatase: 75 IU/L (ref 39–117)
BUN/Creatinine Ratio: 17 (ref 9–23)
BUN: 12 mg/dL (ref 6–20)
Bilirubin Total: 0.3 mg/dL (ref 0.0–1.2)
CO2: 23 mmol/L (ref 20–29)
Calcium: 9 mg/dL (ref 8.7–10.2)
Chloride: 101 mmol/L (ref 96–106)
Creatinine, Ser: 0.72 mg/dL (ref 0.57–1.00)
GFR calc Af Amer: 125 mL/min/{1.73_m2} (ref >59)
GFR calc non Af Amer: 109 mL/min/{1.73_m2} (ref >59)
Globulin, Total: 3.6 g/dL (ref 1.5–4.5)
Glucose: 82 mg/dL (ref 65–99)
Potassium: 4 mmol/L (ref 3.5–5.2)
Sodium: 138 mmol/L (ref 134–144)
Total Protein: 7.1 g/dL (ref 6.0–8.5)

## 2018-12-18 LAB — LIPID PANEL WITH LDL/HDL RATIO
Cholesterol, Total: 143 mg/dL (ref 100–199)
HDL: 50 mg/dL (ref >39)
LDL Calculated: 82 mg/dL (ref 0–99)
LDl/HDL Ratio: 1.6 ratio (ref 0.0–3.2)
Triglycerides: 53 mg/dL (ref 0–149)
VLDL Cholesterol Cal: 11 mg/dL (ref 5–40)

## 2018-12-18 LAB — HEMOGLOBIN A1C
Est. average glucose Bld gHb Est-mCnc: 105 mg/dL
Hgb A1c MFr Bld: 5.3 % (ref 4.8–5.6)

## 2018-12-18 LAB — INSULIN, RANDOM: INSULIN: 5.6 u[IU]/mL (ref 2.6–24.9)

## 2018-12-18 LAB — VITAMIN D 25 HYDROXY (VIT D DEFICIENCY, FRACTURES): Vit D, 25-Hydroxy: 54.1 ng/mL (ref 30.0–100.0)

## 2018-12-19 ENCOUNTER — Other Ambulatory Visit (HOSPITAL_COMMUNITY): Payer: BC Managed Care – PPO

## 2018-12-20 ENCOUNTER — Other Ambulatory Visit (HOSPITAL_COMMUNITY)
Admission: RE | Admit: 2018-12-20 | Discharge: 2018-12-20 | Disposition: A | Discharge status: Home / Self Care | Payer: BC Managed Care – PPO | Source: Ambulatory Visit | Attending: Surgery | Admitting: Surgery

## 2018-12-20 DIAGNOSIS — Z1159 Encounter for screening for other viral diseases: Secondary | ICD-10-CM | POA: Diagnosis not present

## 2018-12-20 LAB — SARS CORONAVIRUS 2 (TAT 6-24 HRS): SARS Coronavirus 2: NEGATIVE

## 2018-12-22 NOTE — Progress Notes (Signed)
12-17-18 (Epic) CBC, and CMP on chart.

## 2018-12-22 NOTE — Patient Instructions (Addendum)
YOU HAVE HAD THE  COVID 19 TEST. PLEASE CONTINUE THE QUARANTINE INSTRUCTIONS AS OUTLINED IN YOUR HANDOUT.                Daryl Quiros  12/22/2018   Your procedure is scheduled on: 12-24-18    Report to [facility] Main  Entrance    Report to Admitting at 5:30 AM   1 VISITOR IS ALLOWED TO WAIT IN WAITING ROOM  ONLY DAY OF YOUR SURGERY.    Call this number if you have problems the morning of surgery [phone]    Remember: Do not eat food or drink liquids :After Midnight. .     Take these medicines the morning of surgery with A SIP OF WATER:  None  DO NOT TAKE ANY DIABETIC MEDICATIONS DAY OF YOUR SURGERY                               You may not have any metal on your body including hair pins and              piercings     Do not wear jewelry, make-up, lotions, powders or perfumes, deodorant              Do not wear nail polish.  Do not shave  48 hours prior to surgery.                 Do not bring valuables to the hospital. [place].  Contacts, dentures or bridgework may not be worn into surgery.       Patients discharged the day of surgery will not be allowed to drive home. IF YOU ARE HAVING SURGERY AND GOING HOME THE SAME DAY, YOU MUST HAVE AN ADULT TO DRIVE YOU HOME AND BE WITH YOU FOR 24 HOURS. YOU MAY GO HOME BY TAXI OR UBER OR ORTHERWISE, BUT AN ADULT MUST ACCOMPANY YOU HOME AND STAY WITH YOU FOR 24 HOURS.  Name and phone number of your driver: Tashema Tiller [phone]                Please read over the following fact sheets you were given: _____________________________________________________________________  How to Manage Your Diabetes Before and After Surgery  Why is it important to control my blood sugar before and after surgery? . Improving blood sugar levels before and after surgery helps healing and can limit problems. . A way of improving blood sugar control is eating a healthy diet  by: o  Eating less sugar and carbohydrates o  Increasing activity/exercise o  Talking with your doctor about reaching your blood sugar goals . High blood sugars (greater than 180 mg/dL) can raise your risk of infections and slow your recovery, so you will need to focus on controlling your diabetes during the weeks before surgery. . Make sure that the doctor who takes care of your diabetes knows about your planned surgery including the date and location.  How do I manage my blood sugar before surgery? . Check your blood sugar at least 4 times a day, starting 2 days before surgery, to make sure that the level is not too high or low. o Check your blood sugar the morning of your surgery when you wake up and every 2 hours until you get to the Short Stay unit. . If  your blood sugar is less than 70 mg/dL, you will need to treat for low blood sugar: o Do not take insulin. o Treat a low blood sugar (less than 70 mg/dL) with  cup of clear juice (cranberry or apple), 4 glucose tablets, OR glucose gel. o Recheck blood sugar in 15 minutes after treatment (to make sure it is greater than 70 mg/dL). If your blood sugar is not greater than 70 mg/dL on recheck, call [phone] for further instructions. . Report your blood sugar to the short stay nurse when you get to Short Stay.  . If you are admitted to the hospital after surgery: o Your blood sugar will be checked by the staff and you will probably be given insulin after surgery (instead of oral diabetes medicines) to make sure you have good blood sugar levels. o The goal for blood sugar control after surgery is 80-180 mg/dL.   WHAT DO I DO ABOUT MY DIABETES MEDICATION?  [name] Do not take oral diabetes medicines (pills) the morning of surgery.  . THE DAY BEFORE SURGERY, take your usual dose of Metformin                [place] - Preparing for Surgery Before surgery, you can play an important role.  Because skin is not sterile, your skin needs to be  as free of germs as possible.  You can reduce the number of germs on your skin by washing with CHG (chlorahexidine gluconate) soap before surgery.  CHG is an antiseptic cleaner which kills germs and bonds with the skin to continue killing germs even after washing. Please DO NOT use if you have an allergy to CHG or antibacterial soaps.  If your skin becomes reddened/irritated stop using the CHG and inform your nurse when you arrive at Short Stay. Do not shave (including legs and underarms) for at least 48 hours prior to the first CHG shower.  You may shave your face/neck. Please follow these instructions carefully:  1.  Shower with CHG Soap the night before surgery and the  morning of Surgery.  2.  If you choose to wash your hair, wash your hair first as usual with your  normal  shampoo.  3.  After you shampoo, rinse your hair and body thoroughly to remove the  shampoo.                           4.  Use CHG as you would any other liquid soap.  You can apply chg directly  to the skin and wash                       Gently with a scrungie or clean washcloth.  5.  Apply the CHG Soap to your body ONLY FROM THE NECK DOWN.   Do not use on face/ open                           Wound or open sores. Avoid contact with eyes, ears mouth and genitals (private parts).                       Wash face,  Genitals (private parts) with your normal soap.             6.  Wash thoroughly, paying special attention to the area where your surgery  will be performed.  7.  Thoroughly rinse your body with warm water from the neck down.  8.  DO NOT shower/wash with your normal soap after using and rinsing off  the CHG Soap.                9.  Pat yourself dry with a clean towel.            10.  Wear clean pajamas.            11.  Place clean sheets on your bed the night of your first shower and do not  sleep with pets. Day of Surgery : Do not apply any lotions/deodorants the morning of surgery.  Please wear clean clothes to the  hospital/surgery center.  FAILURE TO FOLLOW THESE INSTRUCTIONS MAY RESULT IN THE CANCELLATION OF YOUR SURGERY PATIENT SIGNATURE_________________________________  NURSE SIGNATURE__________________________________  ________________________________________________________________________

## 2018-12-23 ENCOUNTER — Other Ambulatory Visit: Payer: Self-pay

## 2018-12-23 ENCOUNTER — Encounter (HOSPITAL_COMMUNITY)
Admission: RE | Admit: 2018-12-23 | Discharge: 2018-12-23 | Disposition: A | Discharge status: Home / Self Care | Payer: BC Managed Care – PPO | Source: Ambulatory Visit | Attending: Surgery | Admitting: Surgery

## 2018-12-23 ENCOUNTER — Encounter (HOSPITAL_COMMUNITY): Payer: Self-pay

## 2018-12-23 DIAGNOSIS — K432 Incisional hernia without obstruction or gangrene: Secondary | ICD-10-CM | POA: Insufficient documentation

## 2018-12-23 DIAGNOSIS — K439 Ventral hernia without obstruction or gangrene: Secondary | ICD-10-CM | POA: Diagnosis not present

## 2018-12-23 DIAGNOSIS — I10 Essential (primary) hypertension: Secondary | ICD-10-CM | POA: Insufficient documentation

## 2018-12-23 DIAGNOSIS — Z01818 Encounter for other preprocedural examination: Secondary | ICD-10-CM | POA: Diagnosis not present

## 2018-12-23 LAB — CBC
HCT: 44.9 % (ref 36.0–46.0)
Hemoglobin: 13.8 g/dL (ref 12.0–15.0)
MCH: 28.2 pg (ref 26.0–34.0)
MCHC: 30.7 g/dL (ref 30.0–36.0)
MCV: 91.8 fL (ref 80.0–100.0)
Platelets: 363 10*3/uL (ref 150–400)
RBC: 4.89 MIL/uL (ref 3.87–5.11)
RDW: 14.4 % (ref 11.5–15.5)
WBC: 11.8 10*3/uL — ABNORMAL HIGH (ref 4.0–10.5)
nRBC: 0 % (ref 0.0–0.2)

## 2018-12-23 LAB — HCG, SERUM, QUALITATIVE: Preg, Serum: NEGATIVE

## 2018-12-23 LAB — GLUCOSE, CAPILLARY: Glucose-Capillary: 81 mg/dL (ref 70–99)

## 2018-12-23 MED ORDER — DEXTROSE 5 % IV SOLN
3.0000 g | INTRAVENOUS | Status: AC
  Administered 2018-12-24: 3 g via INTRAVENOUS
  Filled 2018-12-23: qty 3

## 2018-12-23 NOTE — Anesthesia Preprocedure Evaluation (Signed)
Anesthesia Evaluation  Patient identified by MRN, date of birth, ID band Patient awake    Reviewed: Allergy & Precautions, NPO status , Patient's Chart, lab work & pertinent test results, reviewed documented beta blocker date and time   Airway Mallampati: II   Neck ROM: Full    Dental  (+) Teeth Intact, Dental Advisory Given   Pulmonary    Pulmonary exam normal        Cardiovascular negative cardio ROS Normal cardiovascular exam     Neuro/Psych  Headaches,    GI/Hepatic Neg liver ROS, Chron's   Endo/Other  Morbid obesity  Renal/GU negative Renal ROS   Fibroids    Musculoskeletal   Abdominal (+) + obese,   Peds  Hematology 13/39   Anesthesia Other Findings Super obese  Reproductive/Obstetrics                             Anesthesia Physical  Anesthesia Plan  ASA: III  Anesthesia Plan: General   Post-op Pain Management:    Induction: Intravenous  PONV Risk Score and Plan: 2 and Propofol infusion and Treatment may vary due to age or medical condition  Airway Management Planned: Oral ETT and LMA  Additional Equipment:   Intra-op Plan:   Post-operative Plan: Extubation in OR  Informed Consent: I have reviewed the patients History and Physical, chart, labs and discussed the procedure including the risks, benefits and alternatives for the proposed anesthesia with the patient or authorized representative who has indicated his/her understanding and acceptance.       Plan Discussed with: CRNA and Anesthesiologist  Anesthesia Plan Comments: (  )        Anesthesia Quick Evaluation

## 2018-12-24 ENCOUNTER — Ambulatory Visit (HOSPITAL_COMMUNITY): Payer: BC Managed Care – PPO | Admitting: Physician Assistant

## 2018-12-24 ENCOUNTER — Ambulatory Visit (HOSPITAL_COMMUNITY): Payer: BC Managed Care – PPO | Admitting: Certified Registered"

## 2018-12-24 ENCOUNTER — Encounter (HOSPITAL_COMMUNITY): Payer: Self-pay

## 2018-12-24 ENCOUNTER — Ambulatory Visit (HOSPITAL_COMMUNITY)
Admission: RE | Admit: 2018-12-24 | Discharge: 2018-12-24 | Disposition: A | Discharge status: Home / Self Care | Payer: BC Managed Care – PPO | Attending: Surgery | Admitting: Surgery

## 2018-12-24 ENCOUNTER — Encounter (HOSPITAL_COMMUNITY)
Admission: RE | Disposition: A | Discharge status: Home / Self Care | Payer: Self-pay | Source: Home / Self Care | Attending: Surgery

## 2018-12-24 ENCOUNTER — Other Ambulatory Visit: Payer: Self-pay

## 2018-12-24 DIAGNOSIS — K436 Other and unspecified ventral hernia with obstruction, without gangrene: Secondary | ICD-10-CM | POA: Diagnosis not present

## 2018-12-24 DIAGNOSIS — Z79899 Other long term (current) drug therapy: Secondary | ICD-10-CM | POA: Diagnosis not present

## 2018-12-24 DIAGNOSIS — Z7984 Long term (current) use of oral hypoglycemic drugs: Secondary | ICD-10-CM | POA: Diagnosis not present

## 2018-12-24 DIAGNOSIS — Z6838 Body mass index (BMI) 38.0-38.9, adult: Secondary | ICD-10-CM | POA: Diagnosis not present

## 2018-12-24 DIAGNOSIS — K439 Ventral hernia without obstruction or gangrene: Secondary | ICD-10-CM | POA: Diagnosis present

## 2018-12-24 DIAGNOSIS — K432 Incisional hernia without obstruction or gangrene: Secondary | ICD-10-CM | POA: Diagnosis not present

## 2018-12-24 HISTORY — PX: INCISIONAL HERNIA REPAIR: SHX193

## 2018-12-24 LAB — GLUCOSE, CAPILLARY: Glucose-Capillary: 80 mg/dL (ref 70–99)

## 2018-12-24 SURGERY — REPAIR, HERNIA, INCISIONAL
Anesthesia: General | Site: Abdomen

## 2018-12-24 MED ORDER — 0.9 % SODIUM CHLORIDE (POUR BTL) OPTIME
TOPICAL | Status: DC | PRN
  Administered 2018-12-24: 1000 mL

## 2018-12-24 MED ORDER — FENTANYL CITRATE (PF) 250 MCG/5ML IJ SOLN
INTRAMUSCULAR | Status: DC | PRN
  Administered 2018-12-24: 50 ug via INTRAVENOUS
  Administered 2018-12-24: 100 ug via INTRAVENOUS
  Administered 2018-12-24: 50 ug via INTRAVENOUS

## 2018-12-24 MED ORDER — ONDANSETRON HCL 4 MG/2ML IJ SOLN
INTRAMUSCULAR | Status: DC | PRN
  Administered 2018-12-24: 4 mg via INTRAVENOUS

## 2018-12-24 MED ORDER — FENTANYL CITRATE (PF) 100 MCG/2ML IJ SOLN
INTRAMUSCULAR | Status: AC
  Filled 2018-12-24: qty 2

## 2018-12-24 MED ORDER — BUPIVACAINE HCL (PF) 0.25 % IJ SOLN
INTRAMUSCULAR | Status: AC
  Filled 2018-12-24: qty 30

## 2018-12-24 MED ORDER — CHLORHEXIDINE GLUCONATE CLOTH 2 % EX PADS: 6.0000 | MEDICATED_PAD | Freq: Once | CUTANEOUS | Status: DC

## 2018-12-24 MED ORDER — TRAMADOL HCL 50 MG PO TABS: 50.0000 mg | ORAL_TABLET | Freq: Four times a day (QID) | ORAL | 0 refills | Status: DC | PRN

## 2018-12-24 MED ORDER — LIDOCAINE 2% (20 MG/ML) 5 ML SYRINGE
INTRAMUSCULAR | Status: AC
  Filled 2018-12-24: qty 5

## 2018-12-24 MED ORDER — PROPOFOL 10 MG/ML IV BOLUS
INTRAVENOUS | Status: AC
  Filled 2018-12-24: qty 40

## 2018-12-24 MED ORDER — MEPERIDINE HCL 50 MG/ML IJ SOLN: 6.2500 mg | INTRAMUSCULAR | Status: DC | PRN

## 2018-12-24 MED ORDER — ACETAMINOPHEN 160 MG/5ML PO SOLN: 325.0000 mg | ORAL | Status: DC | PRN

## 2018-12-24 MED ORDER — BUPIVACAINE HCL (PF) 0.25 % IJ SOLN
INTRAMUSCULAR | Status: DC | PRN
  Administered 2018-12-24: 30 mL

## 2018-12-24 MED ORDER — ACETAMINOPHEN 10 MG/ML IV SOLN
1000.0000 mg | Freq: Once | INTRAVENOUS | Status: AC
  Administered 2018-12-24: 10:00:00 1000 mg via INTRAVENOUS

## 2018-12-24 MED ORDER — ROCURONIUM BROMIDE 10 MG/ML (PF) SYRINGE
PREFILLED_SYRINGE | INTRAVENOUS | Status: DC | PRN
  Administered 2018-12-24: 70 mg via INTRAVENOUS
  Administered 2018-12-24: 10 mg via INTRAVENOUS

## 2018-12-24 MED ORDER — MIDAZOLAM HCL 2 MG/2ML IJ SOLN
INTRAMUSCULAR | Status: DC | PRN
  Administered 2018-12-24: 2 mg via INTRAVENOUS

## 2018-12-24 MED ORDER — SUGAMMADEX SODIUM 200 MG/2ML IV SOLN
INTRAVENOUS | Status: DC | PRN
  Administered 2018-12-24: 200 mg via INTRAVENOUS

## 2018-12-24 MED ORDER — DEXAMETHASONE SODIUM PHOSPHATE 10 MG/ML IJ SOLN
INTRAMUSCULAR | Status: DC | PRN
  Administered 2018-12-24: 10 mg via INTRAVENOUS

## 2018-12-24 MED ORDER — ROCURONIUM BROMIDE 10 MG/ML (PF) SYRINGE
PREFILLED_SYRINGE | INTRAVENOUS | Status: AC
  Filled 2018-12-24: qty 10

## 2018-12-24 MED ORDER — LIDOCAINE 2% (20 MG/ML) 5 ML SYRINGE
INTRAMUSCULAR | Status: DC | PRN
  Administered 2018-12-24: 60 mg via INTRAVENOUS

## 2018-12-24 MED ORDER — ACETAMINOPHEN 325 MG PO TABS: 325.0000 mg | ORAL_TABLET | ORAL | Status: DC | PRN

## 2018-12-24 MED ORDER — OXYCODONE HCL 5 MG/5ML PO SOLN: 5.0000 mg | Freq: Once | ORAL | Status: DC | PRN

## 2018-12-24 MED ORDER — ONDANSETRON HCL 4 MG/2ML IJ SOLN
INTRAMUSCULAR | Status: AC
  Filled 2018-12-24: qty 2

## 2018-12-24 MED ORDER — PHENYLEPHRINE HCL (PRESSORS) 10 MG/ML IV SOLN
INTRAVENOUS | Status: AC
  Filled 2018-12-24: qty 1

## 2018-12-24 MED ORDER — ONDANSETRON HCL 4 MG/2ML IJ SOLN: 4.0000 mg | Freq: Once | INTRAMUSCULAR | Status: DC | PRN

## 2018-12-24 MED ORDER — PROPOFOL 10 MG/ML IV BOLUS
INTRAVENOUS | Status: DC | PRN
  Administered 2018-12-24: 180 mg via INTRAVENOUS

## 2018-12-24 MED ORDER — ACETAMINOPHEN 10 MG/ML IV SOLN
INTRAVENOUS | Status: AC
  Administered 2018-12-24: 1000 mg via INTRAVENOUS
  Filled 2018-12-24: qty 100

## 2018-12-24 MED ORDER — KETOROLAC TROMETHAMINE 30 MG/ML IJ SOLN
30.0000 mg | Freq: Once | INTRAMUSCULAR | Status: AC
  Administered 2018-12-24: 30 mg via INTRAVENOUS

## 2018-12-24 MED ORDER — OXYCODONE HCL 5 MG PO TABS: 5.0000 mg | ORAL_TABLET | Freq: Once | ORAL | Status: DC | PRN

## 2018-12-24 MED ORDER — MIDAZOLAM HCL 2 MG/2ML IJ SOLN
INTRAMUSCULAR | Status: AC
  Filled 2018-12-24: qty 2

## 2018-12-24 MED ORDER — KETOROLAC TROMETHAMINE 30 MG/ML IJ SOLN
INTRAMUSCULAR | Status: AC
  Filled 2018-12-24: qty 1

## 2018-12-24 MED ORDER — FENTANYL CITRATE (PF) 100 MCG/2ML IJ SOLN
25.0000 ug | INTRAMUSCULAR | Status: DC | PRN
  Administered 2018-12-24 (×2): 50 ug via INTRAVENOUS

## 2018-12-24 MED ORDER — LACTATED RINGERS IV SOLN
INTRAVENOUS | Status: DC
  Administered 2018-12-24: 07:00:00 via INTRAVENOUS

## 2018-12-24 MED ORDER — DEXAMETHASONE SODIUM PHOSPHATE 10 MG/ML IJ SOLN
INTRAMUSCULAR | Status: AC
  Filled 2018-12-24: qty 1

## 2018-12-24 SURGICAL SUPPLY — 32 items
ADH SKN CLS APL DERMABOND .7 (GAUZE/BANDAGES/DRESSINGS) ×2
APL PRP STRL LF DISP 70% ISPRP (MISCELLANEOUS) ×2
BINDER ABDOMINAL 12 ML 46-62 (SOFTGOODS) ×3 IMPLANT
CHLORAPREP W/TINT 26 (MISCELLANEOUS) ×4 IMPLANT
COVER SURGICAL LIGHT HANDLE (MISCELLANEOUS) ×4 IMPLANT
COVER WAND RF STERILE (DRAPES) IMPLANT
DERMABOND ADVANCED (GAUZE/BANDAGES/DRESSINGS) ×2
DERMABOND ADVANCED .7 DNX12 (GAUZE/BANDAGES/DRESSINGS) ×1 IMPLANT
DRAPE LAPAROSCOPIC ABDOMINAL (DRAPES) ×4 IMPLANT
ELECT REM PT RETURN 15FT ADLT (MISCELLANEOUS) ×4 IMPLANT
GAUZE SPONGE 4X4 12PLY STRL (GAUZE/BANDAGES/DRESSINGS) ×1 IMPLANT
GLOVE BIO SURGEON STRL SZ 6.5 (GLOVE) ×2 IMPLANT
GLOVE BIO SURGEONS STRL SZ 6.5 (GLOVE) ×1
GLOVE BIOGEL PI IND STRL 6.5 (GLOVE) ×1 IMPLANT
GLOVE BIOGEL PI INDICATOR 6.5 (GLOVE) ×2
GLOVE SURG ORTHO 8.0 STRL STRW (GLOVE) ×4 IMPLANT
GLOVE SURG SS PI 7.0 STRL IVOR (GLOVE) ×3 IMPLANT
GOWN STRL REUS W/TWL LRG LVL3 (GOWN DISPOSABLE) ×4 IMPLANT
GOWN STRL REUS W/TWL XL LVL3 (GOWN DISPOSABLE) ×8 IMPLANT
KIT BASIN OR (CUSTOM PROCEDURE TRAY) ×4 IMPLANT
KIT TURNOVER KIT A (KITS) IMPLANT
MESH VENTRALEX ST 2.5 CRC MED (Mesh General) ×3 IMPLANT
PACK GENERAL/GYN (CUSTOM PROCEDURE TRAY) ×4 IMPLANT
STAPLER VISISTAT 35W (STAPLE) ×1 IMPLANT
SUT MNCRL AB 4-0 PS2 18 (SUTURE) ×9 IMPLANT
SUT NOVA 1 T20/GS 25DT (SUTURE) IMPLANT
SUT NOVA NAB DX-16 0-1 5-0 T12 (SUTURE) ×9 IMPLANT
SUT NOVA NAB GS-21 0 18 T12 DT (SUTURE) IMPLANT
SUT VIC AB 2-0 CT2 27 (SUTURE) ×4 IMPLANT
SUT VIC AB 3-0 SH 18 (SUTURE) ×3 IMPLANT
SYR CONTROL 10ML LL (SYRINGE) ×3 IMPLANT
TOWEL OR 17X26 10 PK STRL BLUE (TOWEL DISPOSABLE) ×4 IMPLANT

## 2018-12-24 NOTE — H&P (Signed)
General Surgery [facility]  [name] DOB: 1980-06-11 Married / Language: English / Race: Black or African American Female   History of Present Illness  The patient is a 38 year old female who presents with an abdominal wall hernia.  CHIEF COMPLAINT: follow up ventral hernias  Patient returns for follow-up of ventral hernias. Her surgical procedure was postponed due to the virus pandemic. Patient also underwent CT scan on August 25, 2018. This demonstrated 2 hernias in the lower abdominal wall. The hernia to the right of midline appears to be an incisional hernia related to her prior surgery for Crohn's disease. The bulge in the left lower quadrant of the abdominal wall as a spigelian hernia containing incarcerated fatty tissue. Patient presents to discuss surgical repair of both hernias. She denies any signs or symptoms of obstruction. She is not having any significant pain. Neither hernia has changed significantly over the past several months.   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 Laparoscopic Inguinal Hernia Surgery  Right. Resection of Small Bowel   Diagnostic Studies History Colonoscopy  1-5 years ago Pap Smear  1-5 years ago  Allergies No Known Allergies    Medication History  Vitamin D (Ergocalciferol) (1.25 MG(50000 UT) Capsule, Oral) Active. metFORMIN HCl (500MG Tablet, Oral) Active. Fluticasone Propionate (50MCG/ACT Suspension, Nasal) Active. Cephalexin (500MG Capsule, Oral) Active. Remicade (100MG For Solution, Intravenous) Active. Vital-D Rx (1MG Tablet, Oral) Active. Calcium (500MG Tablet, Oral) Active. Medications Reconciled  Social History Alcohol use  Remotely quit alcohol  use. Caffeine use  Carbonated beverages, Tea. No drug use  Tobacco use  Never smoker.  Family History Depression  Sister. Diabetes Mellitus  Sister. Heart Disease  Mother.  Pregnancy / Birth History Age at menarche  62 years. Contraceptive History  Oral contraceptives. Gravida  0 Para  0 Regular periods   Other Problems Crohn's Disease  Hemorrhoids  Inguinal Hernia  Other disease, cancer, significant illness  Umbilical Hernia Repair  Ventral Hernia Repair   Vitals  Weight: 217 lb Height: 62in Body Surface Area: 1.98 m Body Mass Index: 39.69 kg/m  Temp.: 39F (Oral)  Pulse: 106 (Regular)  BP: 120/64(Sitting, Left Arm, Standard)  Physical Exam  See vital signs recorded above  GENERAL APPEARANCE Development: normal Nutritional status: normal Gross deformities: none  SKIN Rash, lesions, ulcers: none Induration, erythema: none Nodules: none palpable  EYES Conjunctiva and lids: Edema in the left eye with mild erythema Pupils: equal and reactive Iris: normal bilaterally  EARS, NOSE, MOUTH, THROAT External ears: no lesion or deformity External nose: no lesion or deformity Hearing: grossly normal Lips: no lesion or deformity Dentition: normal for age Oral mucosa: moist  NECK Symmetric: yes Trachea: midline Thyroid: no palpable nodules in the thyroid bed  CHEST Respiratory effort: normal Retraction or accessory muscle use: no Breath sounds: normal bilaterally Rales, rhonchi, wheeze: none  CARDIOVASCULAR Auscultation: regular rhythm, normal rate Murmurs: none Pulses: carotid and radial pulse 2+ palpable Lower extremity edema: none Lower extremity varicosities: none  ABDOMEN Distension: none Masses: none palpable Tenderness: none Hepatosplenomegaly: not present Hernia: There is a palpable incisional hernia in the right lower quadrant of the abdominal wall located just medial and slightly superior to the transverse  abdominal incision which is present. This hernia is reducible. It is nontender.  Fascial defect measures approximately 2 cm in size. There is a palpable soft tissue mass in the left lower quadrant of the abdominal wall which measures approximately 10 cm in diameter. This is nontender. It is not reducible. By CT scan, this represents incarcerated adipose tissue and a spigelian hernia.  MUSCULOSKELETAL Station and gait: normal Digits and nails: no clubbing or cyanosis Muscle strength: grossly normal all extremities Range of motion: grossly normal all extremities Deformity: none  LYMPHATIC Cervical: none palpable Supraclavicular: none palpable  PSYCHIATRIC Oriented to person, place, and time: yes Mood and affect: normal for situation Judgment and insight: appropriate for situation    Assessment & Plan  INCISIONAL HERNIA OF ANTERIOR ABDOMINAL WALL WITHOUT OBSTRUCTION OR GANGRENE SPIGELIAN HERNIA  The patient presents today for review of her CT scan findings and to discuss surgical repair of both abdominal wall hernias. Patient has an incisional hernia in the right lower quadrant of the abdominal wall related to her prior surgical procedure. She has a spigelian hernia in the left lower quadrant of the abdominal wall. I think these can both be repaired concurrently. I think it would be best to have separate incisions for each hernia repair. I would plan to use a mesh patch at each site. We discussed the use of mesh. We discussed the risk of recurrence. We discussed wearing an abdominal binder following the procedure and restrictions on her activities for several weeks. The patient understands and wishes to proceed.  This will likely be an outpatient surgical procedure. However, we will have the capability of overnight observation if necessary.  The risks and benefits of the procedure have been discussed at length with the patient. The patient understands the proposed procedure,  potential alternative treatments, and the course of recovery to be expected. All of the patient's questions have been answered at this time. The patient wishes to proceed with surgery.   Armandina Gemma, [place] [facility] Office: [phone]

## 2018-12-24 NOTE — Anesthesia Postprocedure Evaluation (Signed)
Anesthesia Post Note  Patient: [name]  Procedure(s) Performed: OPEN REPAIR VENTRAL INCISIONAL HERNIA WITH MESH PATCH AND PRIMARY REPAIR OF LEFT SPIGELIAN HERNIA (N/A Abdomen)     Patient location during evaluation: PACU Anesthesia Type: General Level of consciousness: awake and alert Pain management: pain level controlled Vital Signs Assessment: post-procedure vital signs reviewed and stable Respiratory status: spontaneous breathing, nonlabored ventilation, respiratory function stable and patient connected to nasal cannula oxygen Cardiovascular status: blood pressure returned to baseline and stable Postop Assessment: no apparent nausea or vomiting Anesthetic complications: no    Last Vitals:  Vitals:   12/24/18 1051 12/24/18 1106  BP: 120/71 127/84  Pulse: 74 78  Resp:  18  Temp: 36.9 C   SpO2: 94% 97%    Last Pain:  Vitals:   12/24/18 1106  TempSrc:   PainSc: 3                  Vasiliy Mccarry

## 2018-12-24 NOTE — Transfer of Care (Signed)
Immediate Anesthesia Transfer of Care Note  Patient: [name]  Procedure(s) Performed: OPEN REPAIR INCISIONAL VENTAL HERNIA WITH MESH PATCH AND PRIMARY REPAIR SPIGELIAN HERNIA (N/A Abdomen)  Patient Location: PACU  Anesthesia Type:General  Level of Consciousness: awake, alert  and oriented  Airway & Oxygen Therapy: Patient Spontanous Breathing and Patient connected to face mask oxygen  Post-op Assessment: Report given to RN and Post -op Vital signs reviewed and stable  Post vital signs: Reviewed and stable  Last Vitals:  Vitals Value Taken Time  BP    Temp    Pulse 85 12/24/18 0914  Resp 17 12/24/18 0914  SpO2 100 % 12/24/18 0914  Vitals shown include unvalidated device data.  Last Pain:  Vitals:   12/24/18 0630  TempSrc:   PainSc: 0-No pain         Complications: No apparent anesthesia complications

## 2018-12-24 NOTE — Anesthesia Procedure Notes (Signed)
Procedure Name: Intubation Date/Time: 12/24/2018 7:46 AM Performed by: Eben Burow, CRNA Pre-anesthesia Checklist: Patient identified, Emergency Drugs available, Suction available, Patient being monitored and Timeout performed Patient Re-evaluated:Patient Re-evaluated prior to induction Oxygen Delivery Method: Circle system utilized Preoxygenation: Pre-oxygenation with 100% oxygen Induction Type: IV induction Ventilation: Mask ventilation without difficulty Laryngoscope Size: Mac and 4 Grade View: Grade I Tube type: Oral Tube size: 7.0 mm Number of attempts: 1 Airway Equipment and Method: Stylet Placement Confirmation: ETT inserted through vocal cords under direct vision,  positive ETCO2 and breath sounds checked- equal and bilateral Secured at: 20 cm Tube secured with: Tape Dental Injury: Teeth and Oropharynx as per pre-operative assessment

## 2018-12-24 NOTE — Op Note (Signed)
Operative Note  Pre-operative Diagnosis:  1. Ventral incisional hernia  2. Spigelian hernia  Post-operative Diagnosis:  same  Surgeon:  Armandina Gemma, MD  Assistant:  none   Procedure:  1. Open repair ventral incisional hernia with mesh patch (Ventralex 6.4 cm patch)  2. Primary repair left spigelian hernia  Anesthesia:  general  Estimated Blood Loss:  minimal  Drains: none         Specimen: none  Indications:  Patient also underwent CT scan on August 25, 2018. This demonstrated 2 hernias in the lower abdominal wall. The hernia to the right of midline appears to be an incisional hernia related to her prior surgery for Crohn's disease. The bulge in the left lower quadrant of the abdominal wall as a spigelian hernia containing incarcerated fatty tissue. Patient presents to discuss surgical repair of both hernias.  Procedure Details:  The patient was seen in the pre-op holding area. The risks, benefits, complications, treatment options, and expected outcomes were previously discussed with the patient. The patient agreed with the proposed plan and has signed the informed consent form.  The patient was brought to the operating room by the surgical team, identified as [name] and the procedure verified. A "time out" was completed and the above information confirmed.  Following administration of general anesthesia, the patient is prepped and draped in the usual aseptic fashion.  After ascertaining that an adequate level of anesthesia been achieved, an incision is made at the medial aspect of her right lower quadrant transverse abdominal incision.  Dissection is carried in subcutaneous tissues.  Hernia sac is identified and opened.  Hernia sac is dissected out of the subcutaneous tissues circumferentially and down to the fascia.  The entire hernia sac is excised and discarded.  Fascial defect is at the medial aspect of the previous surgical wound.  There appears to be a layer of mesh in  the prior closure.  However, the hernia defect appears to be just medial to the previous closure.  Defect measures approximately 2.5 to 3 cm in size.  A Ventralex ST medium patch measuring 6.4 cm in diameter is selected and prepared.  It is inserted into the preperitoneal space.  It is incorporated into the closure of the fascial defect with interrupted #1 Novafil simple sutures.  There is good approximation of the fascia without tension.  Local anesthetic is infiltrated around the closure.  Subcutaneous tissues are closed with interrupted 2-0 Vicryl sutures.  Subcutaneous tissues are anesthetized with local anesthetic.  Skin is closed with a running 4-0 Monocryl subcuticular suture.  Next an incision was made over a palpable mass in the lateral left lower quadrant of the abdominal wall.  Dissection is carried in the subcutaneous tissues and hernia sac is again identified.  By CT scan, this represents a spigelian hernia.  Hernia sac is dissected out down to the fascia.  In the wall of the hernia sac are at least 1 or 2 blue permanent sutures.  It is uncertain of their origin.  Hernia sac is dissected out down to the fascial defect and then reduced.  Fascial defect measures 2 cm in length.  There appears to be good integrity to the fascia.  Therefore the fascia is closed with interrupted #1 Novafil simple sutures without the use of prosthetic mesh.  Local anesthetic is infiltrated around the closure.  Subcutaneous tissues are closed with interrupted 3-0 Vicryl sutures.  Skin is closed with a running 3-0 Monocryl subcuticular suture.  Both wounds  are washed and dried.  Dermabond was placed on both wounds as dressing.  Patient is awakened from anesthesia and brought to the recovery room in stable condition.  An abdominal binder is placed.  Patient tolerated the procedure well.   Armandina Gemma, MD [facility] Office: [phone]

## 2018-12-25 ENCOUNTER — Encounter (HOSPITAL_COMMUNITY): Payer: Self-pay | Admitting: Surgery

## 2019-01-05 ENCOUNTER — Other Ambulatory Visit: Payer: Self-pay

## 2019-01-05 ENCOUNTER — Encounter (INDEPENDENT_AMBULATORY_CARE_PROVIDER_SITE_OTHER): Payer: Self-pay | Admitting: Physician Assistant

## 2019-01-05 ENCOUNTER — Telehealth (INDEPENDENT_AMBULATORY_CARE_PROVIDER_SITE_OTHER): Payer: BC Managed Care – PPO | Admitting: Physician Assistant

## 2019-01-05 DIAGNOSIS — E559 Vitamin D deficiency, unspecified: Secondary | ICD-10-CM

## 2019-01-05 DIAGNOSIS — Z6837 Body mass index (BMI) 37.0-37.9, adult: Secondary | ICD-10-CM | POA: Diagnosis not present

## 2019-01-06 NOTE — Progress Notes (Signed)
Office: [phone]  /  Fax: [phone] TeleHealth Visit:  [name] has verbally consented to this TeleHealth visit today. The patient is located at home, the provider is located at the [facility] office. The participants in this visit include the listed provider and patient. The visit was conducted today via Webex.  HPI:   Chief Complaint: OBESITY [name] is here to discuss her progress with her obesity treatment plan. She is on the Category 3 plan and is following her eating plan approximately 90% of the time. She states she is exercising 0 minutes 0 times per week. [facility] reports her most recent weight to be 214 lbs. She just had hernia repair surgery less than 2 weeks ago but is back on plan. She has been journaling but getting greater than 1500 calories daily.  We were unable to weigh the patient today for this TeleHealth visit. She feels as if she has maintained her weight since her last visit. She has lost 80 lbs since starting treatment with [name].  Vitamin D deficiency [name] has a diagnosis of Vitamin D deficiency. She is currently taking Vit D every 2 weeks and denies nausea, vomiting or muscle weakness.  ASSESSMENT AND PLAN:  Vitamin D deficiency  Class 2 severe obesity with serious comorbidity and body mass index (BMI) of 37.0 to 37.9 in adult, unspecified obesity type ([place])  PLAN:  Vitamin D Deficiency [name] was informed that low Vitamin D levels contributes to fatigue and are associated with obesity, breast, and colon cancer. She agrees to continue taking Vit D and will follow-up for routine testing of Vitamin D, at least 2-3 times per year. She was informed of the risk of over-replacement of Vitamin D and agrees to not increase her dose unless she discusses this with [name] first. [name] agrees to follow-up with our clinic in 2 weeks.  Obesity [name] is currently in the action stage of change. As such, her goal is to continue with weight loss  efforts. She has agreed to follow the Category 3 plan. [name] has been instructed to work up to a goal of 150 minutes of combined cardio and strengthening exercise per week for weight loss and overall health benefits. We discussed the following Behavioral Modification Strategies today: work on meal planning and easy cooking plans, and keeping healthy foods in the home.  [name] has agreed to follow-up with our clinic in 2 weeks. She was informed of the importance of frequent follow-up visits to maximize her success with intensive lifestyle modifications for her multiple health conditions.  ALLERGIES: No Known Allergies  MEDICATIONS: Current Outpatient Medications on File Prior to Visit  Medication Sig Dispense Refill   Calcium Carb-Cholecalciferol (CALCIUM 600+D3 PO) Take 1 tablet by mouth daily.     fexofenadine (ALLEGRA) 180 MG tablet Take 180 mg by mouth daily.     ibuprofen (ADVIL) 200 MG tablet Take 200 mg by mouth every 6 (six) hours as needed for headache or moderate pain.     InFLIXimab (REMICADE IV) Inject into the vein every 8 (eight) weeks.      metFORMIN (GLUCOPHAGE) 500 MG tablet Take 1 tablet (500 mg total) by mouth daily with breakfast. 30 tablet 0   Multiple Vitamin (MULTIVITAMIN PO) Take 1 tablet by mouth daily.     tetrahydrozoline (VISINE) 0.05 % ophthalmic solution Place 2 drops into both eyes daily as needed (for dry eyes).     traMADol (ULTRAM) 50 MG tablet Take 1-2 tablets (50-100 mg total) by  mouth every 6 (six) hours as needed for moderate pain. 24 tablet 0   Vitamin D, Ergocalciferol, (DRISDOL) 1.25 MG (50000 UT) CAPS capsule Take 1 capsule (50,000 Units total) by mouth every 14 (fourteen) days. 2 capsule 2   No current facility-administered medications on file prior to visit.     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 Dr. Ronnald Collum   INCISIONAL HERNIA REPAIR N/A 12/24/2018   Procedure: OPEN REPAIR VENTRAL INCISIONAL HERNIA WITH MESH PATCH AND PRIMARY REPAIR OF LEFT SPIGELIAN HERNIA;  Surgeon: Armandina Gemma, MD;  Location: [facility];  Service: General;  Laterality: N/A;   KNEE ARTHROSCOPY  99, 02   Bil.   MYOMECTOMY N/A 11/10/2014   Procedure: MYOMECTOMY;  Surgeon: Everett Graff, MD;  Location: [facility];  Service: Gynecology;  Laterality: N/A;   PILONIDAL CYST EXCISION  2011, 2012   I&Ds   VENTRAL HERNIA REPAIR  11/27/2011   Procedure: LAPAROSCOPIC VENTRAL HERNIA;  Surgeon: Adin Hector, MD;  Location: [facility];  Service: General;  Laterality: N/A;  Laparoscopic Exploration & Repair of Hernia in Abdomen   WISDOM TOOTH EXTRACTION      SOCIAL HISTORY: Social History   Tobacco Use   Smoking status: Never Smoker   Smokeless tobacco: Never Used  Substance Use Topics   Alcohol use: No   Drug use: No    FAMILY HISTORY: Family History  Problem Relation Age of Onset   Aneurysm Mother        brain   Heart disease Mother 52       heart anyrism   Healthy  Father        fairly   Diabetes Sister        half sister   Bipolar disorder Sister        half sister   Colon cancer Maternal Grandmother    Stomach cancer Paternal Grandfather    Multiple sclerosis Sister    ROS: Review of Systems  Gastrointestinal: Negative for nausea and vomiting.  Musculoskeletal:       Negative for muscle weakness.   PHYSICAL EXAM: Pt in no acute distress  RECENT LABS AND TESTS: BMET    Component Value Date/Time   NA 138 12/17/2018 0923   K 4.0 12/17/2018 0923   CL 101 12/17/2018 0923   CO2 23 12/17/2018 0923   GLUCOSE  82 12/17/2018 0923   GLUCOSE 77 07/23/2017 1606   BUN 12 12/17/2018 0923   CREATININE 0.72 12/17/2018 0923   CALCIUM 9.0 12/17/2018 0923   GFRNONAA 109 12/17/2018 0923   GFRAA 125 12/17/2018 0923   Lab Results  Component Value Date   HGBA1C 5.3 12/17/2018   HGBA1C 5.5 08/26/2018   HGBA1C 5.7 (H) 05/08/2018   HGBA1C 5.6 01/06/2018   HGBA1C 6.0 (H) 10/02/2017   Lab Results  Component Value Date   INSULIN 5.6 12/17/2018   INSULIN 14.2 08/26/2018   INSULIN 3.5 05/08/2018   INSULIN 10.1 01/06/2018   INSULIN 9.8 10/02/2017   CBC    Component Value Date/Time   WBC 11.8 (H) 12/23/2018 0843   RBC 4.89 12/23/2018 0843   HGB 13.8 12/23/2018 0843   HGB 11.2 10/02/2017 1245   HCT 44.9 12/23/2018 0843   HCT 36.9 10/02/2017 1245   PLT 363 12/23/2018 0843   MCV 91.8 12/23/2018 0843   MCV 82 10/02/2017 1245   MCH 28.2 12/23/2018 0843   MCHC 30.7 12/23/2018 0843   RDW 14.4 12/23/2018 0843   RDW 17.4 (H) 10/02/2017 1245   LYMPHSABS 3.9 (H) 10/02/2017 1245   MONOABS 1.1 (H) 07/23/2017 1606   EOSABS 0.2 10/02/2017 1245   BASOSABS 0.0 10/02/2017 1245   Iron/TIBC/Ferritin/ %Sat    Component Value Date/Time   IRON 114 07/29/2013 0825   FERRITIN 14.8 07/29/2013 0825   IRONPCTSAT 36.8 07/29/2013 0825   Lipid Panel     Component Value Date/Time   CHOL 143 12/17/2018 0923   TRIG 53 12/17/2018 0923   HDL 50 12/17/2018  0923   CHOLHDL 3 07/23/2017 1606   VLDL 9.4 07/23/2017 1606   LDLCALC 82 12/17/2018 0923   Hepatic Function Panel     Component Value Date/Time   PROT 7.1 12/17/2018 0923   ALBUMIN 3.5 (L) 12/17/2018 0923   AST 11 12/17/2018 0923   ALT 11 12/17/2018 0923   ALKPHOS 75 12/17/2018 0923   BILITOT 0.3 12/17/2018 0923   BILIDIR 0.1 05/29/2016 0938      Component Value Date/Time   TSH 1.130 10/02/2017 1245   TSH 1.10 07/23/2017 1606   TSH 1.86 05/29/2016 0938   Results for JERZEE, JEROME (MRN [id number]) as of 01/06/2019 08:40  Ref. Range 12/17/2018 09:23  Vitamin D, 25-Hydroxy Latest Ref Range: 30.0 - 100.0 ng/mL 54.1   I, Michaelene Song, am acting as Location manager for [employer], PA-C I, Abby Potash, PA-C have reviewed above note and agree with its content

## 2019-01-19 ENCOUNTER — Encounter (INDEPENDENT_AMBULATORY_CARE_PROVIDER_SITE_OTHER): Payer: Self-pay | Admitting: Family Medicine

## 2019-01-19 ENCOUNTER — Ambulatory Visit (INDEPENDENT_AMBULATORY_CARE_PROVIDER_SITE_OTHER): Payer: BC Managed Care – PPO | Admitting: Family Medicine

## 2019-01-19 ENCOUNTER — Other Ambulatory Visit: Payer: Self-pay

## 2019-01-19 ENCOUNTER — Telehealth (INDEPENDENT_AMBULATORY_CARE_PROVIDER_SITE_OTHER): Payer: BC Managed Care – PPO | Admitting: Physician Assistant

## 2019-01-19 DIAGNOSIS — Z6837 Body mass index (BMI) 37.0-37.9, adult: Secondary | ICD-10-CM

## 2019-01-19 DIAGNOSIS — E559 Vitamin D deficiency, unspecified: Secondary | ICD-10-CM

## 2019-01-19 DIAGNOSIS — E8881 Metabolic syndrome: Secondary | ICD-10-CM

## 2019-01-19 MED ORDER — METFORMIN HCL 500 MG PO TABS: 500.0000 mg | ORAL_TABLET | Freq: Every day | ORAL | 0 refills | Status: DC

## 2019-01-19 MED ORDER — VITAMIN D (ERGOCALCIFEROL) 1.25 MG (50000 UNIT) PO CAPS: 50000.0000 [IU] | ORAL_CAPSULE | ORAL | 2 refills | Status: DC

## 2019-01-19 NOTE — Progress Notes (Signed)
Office: [phone]  /  Fax: [phone] TeleHealth Visit:  [name] has verbally consented to this TeleHealth visit today. The patient is located at home, the provider is located at the [facility] office. The participants in this visit include the listed provider and patient. The visit was conducted today via Webex.  HPI:   Chief Complaint: OBESITY [name] is here to discuss her progress with her obesity treatment plan. She is on the Category 3 plan and is following her eating plan approximately 85% of the time. She states she is exercising 0 minutes 0 times per week. [name] has gained 1 lb since her last visit 3 weeks ago. She has not been able to exercise due to hernia surgery 3 weeks ago. She has been released by her surgeon to start cardio. She reports occasionally going over 1500 calories a day. We were unable to weigh the patient today for this TeleHealth visit. She feels as if she has gained 1 lb since her last visit. She has lost 80 lbs since starting treatment with [name].  Vitamin D deficiency [name] has a diagnosis of Vitamin D deficiency. Her Vitamin D level is maintained at goal with prescription Vit D every other week. She denies nausea, vomiting or muscle weakness.  Insulin Resistance [name] has a diagnosis of insulin resistance based on her elevated fasting insulin level >5. Although [name]'s blood glucose readings are still under good control, insulin resistance puts her at greater risk of metabolic syndrome and diabetes. [name] is taking metformin currently, which she reports decreases polyphagia. She continues to work on diet and exercise to decrease risk of diabetes.  ASSESSMENT AND PLAN:  Vitamin D deficiency - Plan: Vitamin D, Ergocalciferol, (DRISDOL) 1.25 MG (50000 UT) CAPS capsule  Insulin resistance - Plan: metFORMIN (GLUCOPHAGE) 500 MG tablet  Class 2 severe obesity with serious comorbidity and body mass index (BMI) of 37.0 to 37.9 in  adult, unspecified obesity type ([place])  PLAN:  Vitamin D Deficiency [name] was informed that low Vitamin D levels contributes to fatigue and are associated with obesity, breast, and colon cancer. She agrees to continue to take prescription Vit D @ 50,000 IU every other week #2 with 0 refills and will follow-up for routine testing of Vitamin D, at least 2-3 times per year. She was informed of the risk of over-replacement of Vitamin D and agrees to not increase her dose unless she discusses this with [name] first. [name] agrees to follow-up with our clinic in 2-3 weeks.  Insulin Resistance [name] will continue to work on weight loss, exercise, and decreasing simple carbohydrates in her diet to help decrease the risk of diabetes.  [name] was given a refill on her metformin 500 mg QAM #30 with 0 refills. She agrees to follow-up with our clinic in 2-3 weeks.  Obesity [name] is currently in the action stage of change. As such, her goal is to continue with weight loss efforts. She has agreed to follow the Category 3 plan or journal 1400-1500 calories + 95-100 grams of protein daily. [name] was advised to keep calories between 1400-1500 a day and to maximize her protein intake. [name] has been instructed to start back cardio exercise as tolerated for weight loss and overall health benefits. We discussed the following Behavioral Modification Strategies today: increasing lean protein intake, planning for success, and keep a strict food journal.  [name] has agreed to follow-up with our clinic in 2-3 weeks. She was informed of the importance of frequent follow-up  visits to maximize her success with intensive lifestyle modifications for her multiple health conditions.  ALLERGIES: No Known Allergies  MEDICATIONS: Current Outpatient Medications on File Prior to Visit  Medication Sig Dispense Refill  . Calcium Carb-Cholecalciferol (CALCIUM 600+D3 PO) Take 1 tablet by mouth daily.    . fexofenadine (ALLEGRA) 180  MG tablet Take 180 mg by mouth daily.    [name] ibuprofen (ADVIL) 200 MG tablet Take 200 mg by mouth every 6 (six) hours as needed for headache or moderate pain.    . InFLIXimab (REMICADE IV) Inject into the vein every 8 (eight) weeks.     . Multiple Vitamin (MULTIVITAMIN PO) Take 1 tablet by mouth daily.    [name] tetrahydrozoline (VISINE) 0.05 % ophthalmic solution Place 2 drops into both eyes daily as needed (for dry eyes).    . traMADol (ULTRAM) 50 MG tablet Take 1-2 tablets (50-100 mg total) by mouth every 6 (six) hours as needed for moderate pain. 24 tablet 0   No current facility-administered medications on file prior to visit.     PAST MEDICAL HISTORY: Past Medical History:  Diagnosis Date  . Abrasion of upper arm with infection, left, sequela    started antibiotics lef arm oct 4th started antibiotics healing well  . Anemia    hx of 2 yrs ago  . Chicken pox   . Chronic rhinitis   . Contact dermatitis and other eczema, due to unspecified cause   . Crohn's 11-15-11   no issues in 2 yrs  . Fibroids 11-15-11   remains with fibroids  . Hernia   . Incisional hernia, RLQ 10/02/2011  . Infertility, female   . Mononucleosis   . Obesity   . Pilonidal abscess   . Rectal abscess   . 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 Vitamin D deficiency     PAST SURGICAL HISTORY: Past Surgical History:  Procedure Laterality Date  . APPENDECTOMY  7/06  . COLONOSCOPY WITH PROPOFOL N/A 03/07/2017   Procedure: COLONOSCOPY WITH PROPOFOL;  Surgeon: Milus Banister, MD;  [facility];  Service: Endoscopy;  Laterality: N/A;  . EYE SURGERY  11-15-11   2003-multiple stys  . ileocecal resection  1/11   crohns disease with fisula/stricture. Dr. Ronnald Collum  . INCISIONAL HERNIA REPAIR N/A 12/24/2018   Procedure: OPEN REPAIR VENTRAL INCISIONAL HERNIA WITH MESH PATCH AND PRIMARY REPAIR OF LEFT SPIGELIAN HERNIA;  Surgeon: Armandina Gemma, MD;  Location: [facility];  Service: General;  Laterality: N/A;  .  KNEE ARTHROSCOPY  99, 02   Bil.  [name] MYOMECTOMY N/A 11/10/2014   Procedure: MYOMECTOMY;  Surgeon: Everett Graff, MD;  Location: [facility];  Service: Gynecology;  Laterality: N/A;  . PILONIDAL CYST EXCISION  2011, 2012   I&Ds  . VENTRAL HERNIA REPAIR  11/27/2011   Procedure: LAPAROSCOPIC VENTRAL HERNIA;  Surgeon: Adin Hector, MD;  Location: [facility];  Service: General;  Laterality: N/A;  Laparoscopic Exploration & Repair of Hernia in Abdomen  . WISDOM TOOTH EXTRACTION      SOCIAL HISTORY: Social History   Tobacco Use  . Smoking status: Never Smoker  . Smokeless tobacco: Never Used  Substance Use Topics  . Alcohol use: No  . Drug use: No    FAMILY HISTORY: Family History  Problem Relation Age of Onset  . Aneurysm Mother        brain  . Heart disease Mother 6       heart anyrism  . Healthy Father  fairly  . Diabetes Sister        half sister  . Bipolar disorder Sister        half sister  . Colon cancer Maternal Grandmother   . Stomach cancer Paternal Grandfather   . Multiple sclerosis Sister    ROS: Review of Systems  Gastrointestinal: Negative for nausea and vomiting.  Musculoskeletal:       Negative for muscle weakness.   PHYSICAL EXAM: Pt in no acute distress  RECENT LABS AND TESTS: BMET    Component Value Date/Time   NA 138 12/17/2018 0923   K 4.0 12/17/2018 0923   CL 101 12/17/2018 0923   CO2 23 12/17/2018 0923   GLUCOSE 82 12/17/2018 0923   GLUCOSE 77 07/23/2017 1606   BUN 12 12/17/2018 0923   CREATININE 0.72 12/17/2018 0923   CALCIUM 9.0 12/17/2018 0923   GFRNONAA 109 12/17/2018 0923   GFRAA 125 12/17/2018 0923   Lab Results  Component Value Date   HGBA1C 5.3 12/17/2018   HGBA1C 5.5 08/26/2018   HGBA1C 5.7 (H) 05/08/2018   HGBA1C 5.6 01/06/2018   HGBA1C 6.0 (H) 10/02/2017   Lab Results  Component Value Date   INSULIN 5.6 12/17/2018   INSULIN 14.2 08/26/2018   INSULIN 3.5 05/08/2018   INSULIN 10.1 01/06/2018   INSULIN 9.8 10/02/2017    CBC    Component Value Date/Time   WBC 11.8 (H) 12/23/2018 0843   RBC 4.89 12/23/2018 0843   HGB 13.8 12/23/2018 0843   HGB 11.2 10/02/2017 1245   HCT 44.9 12/23/2018 0843   HCT 36.9 10/02/2017 1245   PLT 363 12/23/2018 0843   MCV 91.8 12/23/2018 0843   MCV 82 10/02/2017 1245   MCH 28.2 12/23/2018 0843   MCHC 30.7 12/23/2018 0843   RDW 14.4 12/23/2018 0843   RDW 17.4 (H) 10/02/2017 1245   LYMPHSABS 3.9 (H) 10/02/2017 1245   MONOABS 1.1 (H) 07/23/2017 1606   EOSABS 0.2 10/02/2017 1245   BASOSABS 0.0 10/02/2017 1245   Iron/TIBC/Ferritin/ %Sat    Component Value Date/Time   IRON 114 07/29/2013 0825   FERRITIN 14.8 07/29/2013 0825   IRONPCTSAT 36.8 07/29/2013 0825   Lipid Panel     Component Value Date/Time   CHOL 143 12/17/2018 0923   TRIG 53 12/17/2018 0923   HDL 50 12/17/2018 0923   CHOLHDL 3 07/23/2017 1606   VLDL 9.4 07/23/2017 1606   LDLCALC 82 12/17/2018 0923   Hepatic Function Panel     Component Value Date/Time   PROT 7.1 12/17/2018 0923   ALBUMIN 3.5 (L) 12/17/2018 0923   AST 11 12/17/2018 0923   ALT 11 12/17/2018 0923   ALKPHOS 75 12/17/2018 0923   BILITOT 0.3 12/17/2018 0923   BILIDIR 0.1 05/29/2016 0938      Component Value Date/Time   TSH 1.130 10/02/2017 1245   TSH 1.10 07/23/2017 1606   TSH 1.86 05/29/2016 0938   Results for CHARMIAN, FORBIS (MRN [id number]) as of 01/19/2019 12:13  Ref. Range 12/17/2018 09:23  Vitamin D, 25-Hydroxy Latest Ref Range: 30.0 - 100.0 ng/mL 54.1   I, Michaelene Song, am acting as Location manager for [employer], FNP  I have reviewed the above documentation for accuracy and completeness, and I agree with the above.  - Fahmida Jurich, FNP-C.

## 2019-01-21 ENCOUNTER — Encounter (INDEPENDENT_AMBULATORY_CARE_PROVIDER_SITE_OTHER): Payer: Self-pay | Admitting: Family Medicine

## 2019-02-03 ENCOUNTER — Other Ambulatory Visit: Payer: Self-pay

## 2019-02-03 ENCOUNTER — Ambulatory Visit (INDEPENDENT_AMBULATORY_CARE_PROVIDER_SITE_OTHER): Payer: BC Managed Care – PPO | Admitting: Physician Assistant

## 2019-02-03 VITALS — BP 92/62 | HR 83 | Temp 98.2°F | Ht 63.0 in | Wt 212.8 lb

## 2019-02-03 DIAGNOSIS — Z9189 Other specified personal risk factors, not elsewhere classified: Secondary | ICD-10-CM | POA: Diagnosis not present

## 2019-02-03 DIAGNOSIS — Z6837 Body mass index (BMI) 37.0-37.9, adult: Secondary | ICD-10-CM

## 2019-02-03 DIAGNOSIS — R7303 Prediabetes: Secondary | ICD-10-CM

## 2019-02-03 DIAGNOSIS — E559 Vitamin D deficiency, unspecified: Secondary | ICD-10-CM

## 2019-02-03 MED ORDER — METFORMIN HCL 500 MG PO TABS: 500.0000 mg | ORAL_TABLET | Freq: Every day | ORAL | 0 refills | Status: DC

## 2019-02-04 NOTE — Progress Notes (Signed)
Office: [phone]  /  Fax: [phone]   HPI:   Chief Complaint: OBESITY [name] is here to discuss her progress with her obesity treatment plan. She is on the Category 3 plan and is following her eating plan approximately 100% of the time. She states she is exercising 0 minutes 0 times per week. [name] reports that she gained a few pounds after her surgery and since then has lost 5 lbs. She is enjoying journaling and wants to continue.  Her weight is 212 lb 12.8 oz (96.5 kg) today and has had a weight loss of 1 pound over a period of 7 weeks since her last in-office visit. She has lost 81 lbs since starting treatment with [name].  Pre-Diabetes [name] has a diagnosis of prediabetes based on her elevated Hgb A1c and was informed this puts her at greater risk of developing diabetes. She is taking metformin currently and continues to work on diet and exercise to decrease risk of diabetes. She denies nausea, vomiting, or diarrhea.  At risk for diabetes [name] is at higher than average risk for developing diabetes due to her obesity. She currently denies polyuria or polydipsia.  Vitamin D deficiency [name] has a diagnosis of Vitamin D deficiency. She is currently taking Vit D and denies nausea, vomiting or muscle weakness.  ASSESSMENT AND PLAN:  Vitamin D deficiency  Prediabetes - Plan: metFORMIN (GLUCOPHAGE) 500 MG tablet  At risk for diabetes mellitus  Class 2 severe obesity with serious comorbidity and body mass index (BMI) of 37.0 to 37.9 in adult, unspecified obesity type ([place])  PLAN:  Pre-Diabetes [name] will continue to work on weight loss, exercise, and decreasing simple carbohydrates in her diet to help decrease the risk of diabetes. We dicussed metformin including benefits and risks. She was informed that eating too many simple carbohydrates or too many calories at one sitting increases the likelihood of GI side effects. [name] was given a refill on her metformin #30 with 0  refills and agrees to follow-up with our clinic in 2 weeks.  Diabetes risk counseling [name] was given extended (15 minutes) diabetes prevention counseling today. She is 38 y.o. female and has risk factors for diabetes including obesity. We discussed intensive lifestyle modifications today with an emphasis on weight loss as well as increasing exercise and decreasing simple carbohydrates in her diet.  Vitamin D Deficiency [name] was informed that low Vitamin D levels contributes to fatigue and are associated with obesity, breast, and colon cancer. She agrees to continue taking Vit D and will follow-up for routine testing of Vitamin D, at least 2-3 times per year. Sh was informed of the risk of over-replacement of Vitamin D and agrees to not increase her dose unless she discusses this with [name] first. [name] agrees to follow-up with our clinic in 2 weeks.  Obesity [name] is currently in the action stage of change. As such, her goal is to continue with weight loss efforts. She has agreed to keep a food journal with 1400-1500 calories and 95 grams of protein daily. [name] has been instructed to work up to a goal of 150 minutes of combined cardio and strengthening exercise per week for weight loss and overall health benefits. We discussed the following Behavioral Modification Strategies today: work on meal planning and easy cooking plans, and keeping healthy foods in the home.  [name] has agreed to follow-up with our clinic in 2 weeks. She was informed of the importance of frequent follow-up visits to maximize her success with intensive  lifestyle modifications for her multiple health conditions.  ALLERGIES: No Known Allergies  MEDICATIONS: Current Outpatient Medications on File Prior to Visit  Medication Sig Dispense Refill   Calcium Carb-Cholecalciferol (CALCIUM 600+D3 PO) Take 1 tablet by mouth daily.     fexofenadine (ALLEGRA) 180 MG tablet Take 180 mg by mouth daily.     ibuprofen (ADVIL)  200 MG tablet Take 200 mg by mouth every 6 (six) hours as needed for headache or moderate pain.     InFLIXimab (REMICADE IV) Inject into the vein every 8 (eight) weeks.      Multiple Vitamin (MULTIVITAMIN PO) Take 1 tablet by mouth daily.     tetrahydrozoline (VISINE) 0.05 % ophthalmic solution Place 2 drops into both eyes daily as needed (for dry eyes).     Vitamin D, Ergocalciferol, (DRISDOL) 1.25 MG (50000 UT) CAPS capsule Take 1 capsule (50,000 Units total) by mouth every 14 (fourteen) days. 2 capsule 2   No current facility-administered medications on file prior to visit.     PAST MEDICAL HISTORY: Past Medical History:  Diagnosis Date   Abrasion of upper arm with infection, left, sequela    started antibiotics lef arm oct 4th started antibiotics healing well   Anemia    hx of 2 yrs ago   Chicken pox    Chronic rhinitis    Contact dermatitis and other eczema, due to unspecified cause    Crohn's 11-15-11   no issues in 2 yrs   Fibroids 11-15-11   remains with fibroids   Hernia    Incisional hernia, RLQ 10/02/2011   Infertility, female    Mononucleosis    Obesity    Pilonidal abscess    Rectal abscess    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[place])    Vitamin D deficiency     PAST SURGICAL HISTORY: Past Surgical History:  Procedure Laterality Date   APPENDECTOMY  7/06   COLONOSCOPY WITH PROPOFOL N/A 03/07/2017   Procedure: COLONOSCOPY WITH PROPOFOL;  Surgeon: Milus Banister, MD;  Location: [facility];  Service: Endoscopy;  Laterality: N/A;   EYE SURGERY  11-15-11   2003-multiple stys   ileocecal resection  1/11   crohns disease with fisula/stricture. Dr. Ronnald Collum   INCISIONAL HERNIA REPAIR N/A 12/24/2018   Procedure: OPEN REPAIR VENTRAL INCISIONAL HERNIA WITH MESH PATCH AND PRIMARY REPAIR OF LEFT SPIGELIAN HERNIA;  Surgeon: Armandina Gemma, MD;  Location: [facility];  Service: General;  Laterality: N/A;   KNEE ARTHROSCOPY  99, 02   Bil.    MYOMECTOMY N/A 11/10/2014   Procedure: MYOMECTOMY;  Surgeon: Everett Graff, MD;  Location: [facility];  Service: Gynecology;  Laterality: N/A;   PILONIDAL CYST EXCISION  2011, 2012   I&Ds   VENTRAL HERNIA REPAIR  11/27/2011   Procedure: LAPAROSCOPIC VENTRAL HERNIA;  Surgeon: Adin Hector, MD;  Location: [facility];  Service: General;  Laterality: N/A;  Laparoscopic Exploration & Repair of Hernia in Abdomen   WISDOM TOOTH EXTRACTION      SOCIAL HISTORY: Social History   Tobacco Use   Smoking status: Never Smoker   Smokeless tobacco: Never Used  Substance Use Topics   Alcohol use: No   Drug use: No    FAMILY HISTORY: Family History  Problem Relation Age of Onset   Aneurysm Mother        brain   Heart disease Mother 19       heart anyrism   Healthy Father  fairly   Diabetes Sister        half sister   Bipolar disorder Sister        half sister   Colon cancer Maternal Grandmother    Stomach cancer Paternal Grandfather    Multiple sclerosis Sister    ROS: Review of Systems  Gastrointestinal: Negative for diarrhea, nausea and vomiting.  Musculoskeletal:       Negative for muscle weakness.   PHYSICAL EXAM: Blood pressure 92/62, pulse 83, temperature 98.2 F (36.8 C), temperature source Oral, height 5' 3"  (1.6 m), weight 212 lb 12.8 oz (96.5 kg), last menstrual period 01/20/2019, SpO2 97 %. Body mass index is 37.7 kg/m. Physical Exam Vitals signs reviewed.  Constitutional:      Appearance: Normal appearance. She is obese.  Cardiovascular:     Rate and Rhythm: Normal rate.     Pulses: Normal pulses.  Pulmonary:     Effort: Pulmonary effort is normal.     Breath sounds: Normal breath sounds.  Musculoskeletal: Normal range of motion.  Skin:    General: Skin is warm and dry.  Neurological:     Mental Status: She is alert and oriented to person, place, and time.  Psychiatric:        Behavior: Behavior normal.   RECENT LABS AND TESTS: BMET      Component Value Date/Time   NA 138 12/17/2018 0923   K 4.0 12/17/2018 0923   CL 101 12/17/2018 0923   CO2 23 12/17/2018 0923   GLUCOSE 82 12/17/2018 0923   GLUCOSE 77 07/23/2017 1606   BUN 12 12/17/2018 0923   CREATININE 0.72 12/17/2018 0923   CALCIUM 9.0 12/17/2018 0923   GFRNONAA 109 12/17/2018 0923   GFRAA 125 12/17/2018 0923   Lab Results  Component Value Date   HGBA1C 5.3 12/17/2018   HGBA1C 5.5 08/26/2018   HGBA1C 5.7 (H) 05/08/2018   HGBA1C 5.6 01/06/2018   HGBA1C 6.0 (H) 10/02/2017   Lab Results  Component Value Date   INSULIN 5.6 12/17/2018   INSULIN 14.2 08/26/2018   INSULIN 3.5 05/08/2018   INSULIN 10.1 01/06/2018   INSULIN 9.8 10/02/2017   CBC    Component Value Date/Time   WBC 11.8 (H) 12/23/2018 0843   RBC 4.89 12/23/2018 0843   HGB 13.8 12/23/2018 0843   HGB 11.2 10/02/2017 1245   HCT 44.9 12/23/2018 0843   HCT 36.9 10/02/2017 1245   PLT 363 12/23/2018 0843   MCV 91.8 12/23/2018 0843   MCV 82 10/02/2017 1245   MCH 28.2 12/23/2018 0843   MCHC 30.7 12/23/2018 0843   RDW 14.4 12/23/2018 0843   RDW 17.4 (H) 10/02/2017 1245   LYMPHSABS 3.9 (H) 10/02/2017 1245   MONOABS 1.1 (H) 07/23/2017 1606   EOSABS 0.2 10/02/2017 1245   BASOSABS 0.0 10/02/2017 1245   Iron/TIBC/Ferritin/ %Sat    Component Value Date/Time   IRON 114 07/29/2013 0825   FERRITIN 14.8 07/29/2013 0825   IRONPCTSAT 36.8 07/29/2013 0825   Lipid Panel     Component Value Date/Time   CHOL 143 12/17/2018 0923   TRIG 53 12/17/2018 0923   HDL 50 12/17/2018 0923   CHOLHDL 3 07/23/2017 1606   VLDL 9.4 07/23/2017 1606   LDLCALC 82 12/17/2018 0923   Hepatic Function Panel     Component Value Date/Time   PROT 7.1 12/17/2018 0923   ALBUMIN 3.5 (L) 12/17/2018 0923   AST 11 12/17/2018 0923   ALT 11 12/17/2018 0923   ALKPHOS 75 12/17/2018  0923   BILITOT 0.3 12/17/2018 0923   BILIDIR 0.1 05/29/2016 0938      Component Value Date/Time   TSH 1.130 10/02/2017 1245   TSH 1.10  07/23/2017 1606   TSH 1.86 05/29/2016 0938   Results for SHERLEY, MCKENNEY (MRN [id number]) as of 02/04/2019 08:51  Ref. Range 12/17/2018 09:23  Vitamin D, 25-Hydroxy Latest Ref Range: 30.0 - 100.0 ng/mL 54.1   OBESITY BEHAVIORAL INTERVENTION VISIT  Today's visit was #25  Starting weight: 293 lbs Starting date: 10/02/2017 Today's weight: 212 lbs  Today's date: 02/03/2019 Total lbs lost to date: 81    02/03/2019  Height 5' 3"  (1.6 m)  Weight 212 lb 12.8 oz (96.5 kg)  BMI (Calculated) 37.71  BLOOD PRESSURE - SYSTOLIC 92  BLOOD PRESSURE - DIASTOLIC 62   Body Fat % 74.7 %   ASK: We discussed the diagnosis of obesity with [name] today and Tanaya agreed to give [name] permission to discuss obesity behavioral modification therapy today.  ASSESS: Kymber has the diagnosis of obesity and her BMI today is 37.7. Yazhini is in the action stage of change.   ADVISE: Elveria was educated on the multiple health risks of obesity as well as the benefit of weight loss to improve her health. She was advised of the need for long term treatment and the importance of lifestyle modifications to improve her current health and to decrease her risk of future health problems.  AGREE: Multiple dietary modification options and treatment options were discussed and  Dwanna agreed to follow the recommendations documented in the above note.  ARRANGE: Neeti was educated on the importance of frequent visits to treat obesity as outlined per CMS and USPSTF guidelines and agreed to schedule her next follow up appointment today.  Migdalia Dk, am acting as transcriptionist for Abby Potash, PA-C I, Abby Potash, PA-C have reviewed above note and agree with its content

## 2019-02-16 ENCOUNTER — Encounter (INDEPENDENT_AMBULATORY_CARE_PROVIDER_SITE_OTHER): Payer: Self-pay

## 2019-02-17 ENCOUNTER — Encounter (INDEPENDENT_AMBULATORY_CARE_PROVIDER_SITE_OTHER): Payer: Self-pay | Admitting: Physician Assistant

## 2019-02-17 ENCOUNTER — Other Ambulatory Visit: Payer: Self-pay

## 2019-02-17 ENCOUNTER — Ambulatory Visit (INDEPENDENT_AMBULATORY_CARE_PROVIDER_SITE_OTHER): Payer: BC Managed Care – PPO | Admitting: Physician Assistant

## 2019-02-17 VITALS — BP 108/73 | HR 79 | Temp 98.1°F | Ht 63.0 in | Wt 214.0 lb

## 2019-02-17 DIAGNOSIS — E559 Vitamin D deficiency, unspecified: Secondary | ICD-10-CM | POA: Diagnosis not present

## 2019-02-17 DIAGNOSIS — Z9189 Other specified personal risk factors, not elsewhere classified: Secondary | ICD-10-CM

## 2019-02-17 DIAGNOSIS — R7303 Prediabetes: Secondary | ICD-10-CM

## 2019-02-17 DIAGNOSIS — Z6837 Body mass index (BMI) 37.0-37.9, adult: Secondary | ICD-10-CM

## 2019-02-17 MED ORDER — METFORMIN HCL 500 MG PO TABS: 500.0000 mg | ORAL_TABLET | Freq: Every day | ORAL | 0 refills | Status: DC

## 2019-02-17 MED ORDER — VITAMIN D (ERGOCALCIFEROL) 1.25 MG (50000 UNIT) PO CAPS: 50000.0000 [IU] | ORAL_CAPSULE | ORAL | 0 refills | Status: DC

## 2019-02-18 NOTE — Progress Notes (Signed)
Office: [phone]  /  Fax: [phone]   HPI:   Chief Complaint: OBESITY Aziyah is here to discuss her progress with her obesity treatment plan. She is on the Category 3 plan and is following her eating plan approximately 98 % of the time. She states she is exercising 0 minutes 0 times per week. Buffy reports that she has been going over her calories on some days, and she is hitting between 1600 to 1700 calories. She is not working out currently, but she is planning to start. Her weight is 214 lb (97.1 kg) today and has had a weight gain of 2 pounds over a period of 2 weeks since her last visit. She has lost 79 lbs since starting treatment with [name].  Vitamin D deficiency Marlies has a diagnosis of vitamin D deficiency. Judi is currently taking vit D and she denies nausea, vomiting or muscle weakness.  At risk for osteopenia and osteoporosis Ksenia is at higher risk of osteopenia and osteoporosis due to vitamin D deficiency.   Pre-Diabetes Tricha has a diagnosis of prediabetes based on her elevated Hgb A1c and was informed this puts her at greater risk of developing diabetes. Rainy is taking metformin currently and she denies nausea, vomiting or diarrhea. She continues to work on diet and exercise to decrease risk of diabetes.   ASSESSMENT AND PLAN:  Vitamin D deficiency - Plan: Vitamin D, Ergocalciferol, (DRISDOL) 1.25 MG (50000 UT) CAPS capsule  Prediabetes - Plan: metFORMIN (GLUCOPHAGE) 500 MG tablet  At risk for osteoporosis  Class 2 severe obesity with serious comorbidity and body mass index (BMI) of 37.0 to 37.9 in adult, unspecified obesity type ([place])  PLAN:  Vitamin D Deficiency Emmaleigh was informed that low vitamin D levels contributes to fatigue and are associated with obesity, breast, and colon cancer. Amada agrees to continue to take prescription Vit D @50 ,000 IU every 14 days #2 with no refills and she will follow up for routine testing of vitamin D, at least 2-3  times per year. She was informed of the risk of over-replacement of vitamin D and agrees to not increase her dose unless she discusses this with [name] first. Lavanda agrees to follow up with our clinic in 2 weeks.  At risk for osteopenia and osteoporosis Leyah was given extended  (15 minutes) osteoporosis prevention counseling today. Arriana is at risk for osteopenia and osteoporosis due to her vitamin D deficiency. She was encouraged to take her vitamin D and follow her higher calcium diet and increase strengthening exercise to help strengthen her bones and decrease her risk of osteopenia and osteoporosis.  Pre-Diabetes Erick will continue to work on weight loss, exercise, and decreasing simple carbohydrates in her diet to help decrease the risk of diabetes. We dicussed metformin including benefits and risks. She was informed that eating too many simple carbohydrates or too many calories at one sitting increases the likelihood of GI side effects. Tifini agrees to continue metformin 500 mg once daily with breakfast #30 with no refills and follow up with [name] as directed to monitor her progress.  Obesity Daphene is currently in the action stage of change. As such, her goal is to continue with weight loss efforts She has agreed to keep a food journal with 1400 to 1500 calories and 95 grams of protein daily Lamar has been instructed to work up to a goal of 150 minutes of combined cardio and strengthening exercise per week for weight loss and overall health benefits. We discussed the  following Behavioral Modification Strategies today: keeping healthy foods in the home and work on meal planning and easy cooking plans  Christan has agreed to follow up with our clinic in 2 weeks. She was informed of the importance of frequent follow up visits to maximize her success with intensive lifestyle modifications for her multiple health conditions.  ALLERGIES: No Known Allergies  MEDICATIONS: Current Outpatient  Medications on File Prior to Visit  Medication Sig Dispense Refill   Calcium Carb-Cholecalciferol (CALCIUM 600+D3 PO) Take 1 tablet by mouth daily.     fexofenadine (ALLEGRA) 180 MG tablet Take 180 mg by mouth daily.     ibuprofen (ADVIL) 200 MG tablet Take 200 mg by mouth every 6 (six) hours as needed for headache or moderate pain.     InFLIXimab (REMICADE IV) Inject into the vein every 8 (eight) weeks.      Multiple Vitamin (MULTIVITAMIN PO) Take 1 tablet by mouth daily.     tetrahydrozoline (VISINE) 0.05 % ophthalmic solution Place 2 drops into both eyes daily as needed (for dry eyes).     No current facility-administered medications on file prior to visit.     PAST MEDICAL HISTORY: Past Medical History:  Diagnosis Date   Abrasion of upper arm with infection, left, sequela    started antibiotics lef arm oct 4th started antibiotics healing well   Anemia    hx of 2 yrs ago   Chicken pox    Chronic rhinitis    Contact dermatitis and other eczema, due to unspecified cause    Crohn's 11-15-11   no issues in 2 yrs   Fibroids 11-15-11   remains with fibroids   Hernia    Incisional hernia, RLQ 10/02/2011   Infertility, female    Mononucleosis    Obesity    Pilonidal abscess    Rectal abscess    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[place])    Vitamin D deficiency     PAST SURGICAL HISTORY: Past Surgical History:  Procedure Laterality Date   APPENDECTOMY  7/06   COLONOSCOPY WITH PROPOFOL N/A 03/07/2017   Procedure: COLONOSCOPY WITH PROPOFOL;  Surgeon: Milus Banister, MD;  Location: [facility];  Service: Endoscopy;  Laterality: N/A;   EYE SURGERY  11-15-11   2003-multiple stys   ileocecal resection  1/11   crohns disease with fisula/stricture. Dr. Ronnald Collum   INCISIONAL HERNIA REPAIR N/A 12/24/2018   Procedure: OPEN REPAIR VENTRAL INCISIONAL HERNIA WITH MESH PATCH AND PRIMARY REPAIR OF LEFT SPIGELIAN HERNIA;  Surgeon: Armandina Gemma, MD;  Location: [facility];  Service: General;  Laterality: N/A;   KNEE ARTHROSCOPY  99, 02   Bil.   MYOMECTOMY N/A 11/10/2014   Procedure: MYOMECTOMY;  Surgeon: Everett Graff, MD;  Location: [facility];  Service: Gynecology;  Laterality: N/A;   PILONIDAL CYST EXCISION  2011, 2012   I&Ds   VENTRAL HERNIA REPAIR  11/27/2011   Procedure: LAPAROSCOPIC VENTRAL HERNIA;  Surgeon: Adin Hector, MD;  Location: [facility];  Service: General;  Laterality: N/A;  Laparoscopic Exploration & Repair of Hernia in Abdomen   WISDOM TOOTH EXTRACTION      SOCIAL HISTORY: Social History   Tobacco Use   Smoking status: Never Smoker   Smokeless tobacco: Never Used  Substance Use Topics   Alcohol use: No   Drug use: No    FAMILY HISTORY: Family History  Problem Relation Age of Onset   Aneurysm Mother        brain  Heart disease Mother 52       heart anyrism   Healthy Father        fairly   Diabetes Sister        half sister   Bipolar disorder Sister        half sister   Colon cancer Maternal Grandmother    Stomach cancer Paternal Grandfather    Multiple sclerosis Sister     ROS: Review of Systems  Constitutional: Negative for weight loss.  Gastrointestinal: Negative for diarrhea, nausea and vomiting.  Musculoskeletal:       Negative for muscle weakness    PHYSICAL EXAM: Blood pressure 108/73, pulse 79, temperature 98.1 F (36.7 C), temperature source Oral, height 5' 3"  (1.6 m), weight 214 lb (97.1 kg), last menstrual period 01/20/2019, SpO2 97 %. Body mass index is 37.91 kg/m. Physical Exam Vitals signs reviewed.  Constitutional:      Appearance: Normal appearance. She is well-developed. She is obese.  Cardiovascular:     Rate and Rhythm: Normal rate.  Pulmonary:     Effort: Pulmonary effort is normal.  Musculoskeletal: Normal range of motion.  Skin:    General: Skin is warm and dry.  Neurological:     Mental Status: She is alert and oriented to person, place, and time.  Psychiatric:         Mood and Affect: Mood normal.        Behavior: Behavior normal.     RECENT LABS AND TESTS: BMET    Component Value Date/Time   NA 138 12/17/2018 0923   K 4.0 12/17/2018 0923   CL 101 12/17/2018 0923   CO2 23 12/17/2018 0923   GLUCOSE 82 12/17/2018 0923   GLUCOSE 77 07/23/2017 1606   BUN 12 12/17/2018 0923   CREATININE 0.72 12/17/2018 0923   CALCIUM 9.0 12/17/2018 0923   GFRNONAA 109 12/17/2018 0923   GFRAA 125 12/17/2018 0923   Lab Results  Component Value Date   HGBA1C 5.3 12/17/2018   HGBA1C 5.5 08/26/2018   HGBA1C 5.7 (H) 05/08/2018   HGBA1C 5.6 01/06/2018   HGBA1C 6.0 (H) 10/02/2017   Lab Results  Component Value Date   INSULIN 5.6 12/17/2018   INSULIN 14.2 08/26/2018   INSULIN 3.5 05/08/2018   INSULIN 10.1 01/06/2018   INSULIN 9.8 10/02/2017   CBC    Component Value Date/Time   WBC 11.8 (H) 12/23/2018 0843   RBC 4.89 12/23/2018 0843   HGB 13.8 12/23/2018 0843   HGB 11.2 10/02/2017 1245   HCT 44.9 12/23/2018 0843   HCT 36.9 10/02/2017 1245   PLT 363 12/23/2018 0843   MCV 91.8 12/23/2018 0843   MCV 82 10/02/2017 1245   MCH 28.2 12/23/2018 0843   MCHC 30.7 12/23/2018 0843   RDW 14.4 12/23/2018 0843   RDW 17.4 (H) 10/02/2017 1245   LYMPHSABS 3.9 (H) 10/02/2017 1245   MONOABS 1.1 (H) 07/23/2017 1606   EOSABS 0.2 10/02/2017 1245   BASOSABS 0.0 10/02/2017 1245   Iron/TIBC/Ferritin/ %Sat    Component Value Date/Time   IRON 114 07/29/2013 0825   FERRITIN 14.8 07/29/2013 0825   IRONPCTSAT 36.8 07/29/2013 0825   Lipid Panel     Component Value Date/Time   CHOL 143 12/17/2018 0923   TRIG 53 12/17/2018 0923   HDL 50 12/17/2018 0923   CHOLHDL 3 07/23/2017 1606   VLDL 9.4 07/23/2017 1606   LDLCALC 82 12/17/2018 0923   Hepatic Function Panel     Component Value Date/Time  PROT 7.1 12/17/2018 0923   ALBUMIN 3.5 (L) 12/17/2018 0923   AST 11 12/17/2018 0923   ALT 11 12/17/2018 0923   ALKPHOS 75 12/17/2018 0923   BILITOT 0.3 12/17/2018 0923     BILIDIR 0.1 05/29/2016 0938      Component Value Date/Time   TSH 1.130 10/02/2017 1245   TSH 1.10 07/23/2017 1606   TSH 1.86 05/29/2016 0938      Ref. Range 12/17/2018 09:23  Vitamin D, 25-Hydroxy Latest Ref Range: 30.0 - 100.0 ng/mL 54.1    OBESITY BEHAVIORAL INTERVENTION VISIT  Today's visit was # 26  Starting weight: 293 lbs Starting date: 10/02/2017 Today's weight : 214 lbs Today's date: 02/17/2019 Total lbs lost to date: 79    02/17/2019  Height 5' 3"  (1.6 m)  Weight 214 lb (97.1 kg)  BMI (Calculated) 37.92  BLOOD PRESSURE - SYSTOLIC 559  BLOOD PRESSURE - DIASTOLIC 73   Body Fat % 74.1 %    ASK: We discussed the diagnosis of obesity with [name] today and Kenslee agreed to give [name] permission to discuss obesity behavioral modification therapy today.  ASSESS: Dale has the diagnosis of obesity and her BMI today is 37.92 Danniell is in the action stage of change   ADVISE: Jamey was educated on the multiple health risks of obesity as well as the benefit of weight loss to improve her health. She was advised of the need for long term treatment and the importance of lifestyle modifications to improve her current health and to decrease her risk of future health problems.  AGREE: Multiple dietary modification options and treatment options were discussed and  Gay agreed to follow the recommendations documented in the above note.  ARRANGE: Akyia was educated on the importance of frequent visits to treat obesity as outlined per CMS and USPSTF guidelines and agreed to schedule her next follow up appointment today.  Corey Skains, am acting as transcriptionist for Abby Potash, PA-C I, Abby Potash, PA-C have reviewed above note and agree with its content

## 2019-03-05 ENCOUNTER — Other Ambulatory Visit: Payer: Self-pay

## 2019-03-05 ENCOUNTER — Ambulatory Visit (INDEPENDENT_AMBULATORY_CARE_PROVIDER_SITE_OTHER): Payer: BC Managed Care – PPO | Admitting: Physician Assistant

## 2019-03-05 VITALS — BP 103/70 | HR 74 | Temp 98.0°F | Ht 63.0 in | Wt 212.0 lb

## 2019-03-05 DIAGNOSIS — Z6837 Body mass index (BMI) 37.0-37.9, adult: Secondary | ICD-10-CM

## 2019-03-05 DIAGNOSIS — E559 Vitamin D deficiency, unspecified: Secondary | ICD-10-CM | POA: Diagnosis not present

## 2019-03-09 NOTE — Progress Notes (Signed)
Office: [phone]  /  Fax: [phone]   HPI:   Chief Complaint: OBESITY [name] is here to discuss her progress with her obesity treatment plan. She is on the Category 3 plan and is following her eating plan approximately 92 % of the time. She states she is walking 40 minutes 7 days per week. [name] reports that she has done a better job staying within her calories. She is not reaching her protein goal daily. Her weight is 212 lb (96.2 kg) today and has had a weight loss of 2 pounds over a period of 2 weeks since her last visit. She has lost 81 lbs since starting treatment with [name].  Vitamin D deficiency [name] has a diagnosis of vitamin D deficiency. [name] is currently taking vit D and she denies nausea, vomiting or muscle weakness.  ASSESSMENT AND PLAN:  Vitamin D deficiency  Class 2 severe obesity with serious comorbidity and body mass index (BMI) of 37.0 to 37.9 in adult, unspecified obesity type ([place])  PLAN:  Vitamin D Deficiency [name] was informed that low vitamin D levels contributes to fatigue and are associated with obesity, breast, and colon cancer. [name] will continue to take prescription Vit D @50 ,000 IU every 2 weeks and she will follow up for routine testing of vitamin D, at least 2-3 times per year. She was informed of the risk of over-replacement of vitamin D and agrees to not increase her dose unless she discusses this with [name] first.  I spent > than 50% of the 15 minute visit on counseling as documented in the note.  Obesity [name] is currently in the action stage of change. As such, her goal is to continue with weight loss efforts She has agreed to follow the Category 3 plan [name] has been instructed to work up to a goal of 150 minutes of combined cardio and strengthening exercise per week for weight loss and overall health benefits. We discussed the following Behavioral Modification Strategies today: keeping healthy foods in the home and work on meal  planning and easy cooking plans  [name] has agreed to follow up with our clinic in 2 weeks. She was informed of the importance of frequent follow up visits to maximize her success with intensive lifestyle modifications for her multiple health conditions.  ALLERGIES: No Known Allergies  MEDICATIONS: Current Outpatient Medications on File Prior to Visit  Medication Sig Dispense Refill   Calcium Carb-Cholecalciferol (CALCIUM 600+D3 PO) Take 1 tablet by mouth daily.     fexofenadine (ALLEGRA) 180 MG tablet Take 180 mg by mouth daily.     ibuprofen (ADVIL) 200 MG tablet Take 200 mg by mouth every 6 (six) hours as needed for headache or moderate pain.     InFLIXimab (REMICADE IV) Inject into the vein every 8 (eight) weeks.      metFORMIN (GLUCOPHAGE) 500 MG tablet Take 1 tablet (500 mg total) by mouth daily with breakfast. 30 tablet 0   Multiple Vitamin (MULTIVITAMIN PO) Take 1 tablet by mouth daily.     tetrahydrozoline (VISINE) 0.05 % ophthalmic solution Place 2 drops into both eyes daily as needed (for dry eyes).     Vitamin D, Ergocalciferol, (DRISDOL) 1.25 MG (50000 UT) CAPS capsule Take 1 capsule (50,000 Units total) by mouth every 14 (fourteen) days. 2 capsule 0   No current facility-administered medications on file prior to visit.     PAST MEDICAL HISTORY: Past Medical History:  Diagnosis Date   Abrasion of upper arm with infection, left,  sequela    started antibiotics lef arm oct 4th started antibiotics healing well   Anemia    hx of 2 yrs ago   Chicken pox    Chronic rhinitis    Contact dermatitis and other eczema, due to unspecified cause    Crohn's 11-15-11   no issues in 2 yrs   Fibroids 11-15-11   remains with fibroids   Hernia    Incisional hernia, RLQ 10/02/2011   Infertility, female    Mononucleosis    Obesity    Pilonidal abscess    Rectal abscess    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[place])    Vitamin D deficiency     PAST SURGICAL  HISTORY: Past Surgical History:  Procedure Laterality Date   APPENDECTOMY  7/06   COLONOSCOPY WITH PROPOFOL N/A 03/07/2017   Procedure: COLONOSCOPY WITH PROPOFOL;  Surgeon: Milus Banister, MD;  Location: [facility];  Service: Endoscopy;  Laterality: N/A;   EYE SURGERY  11-15-11   2003-multiple stys   ileocecal resection  1/11   crohns disease with fisula/stricture. Dr. Ronnald Collum   INCISIONAL HERNIA REPAIR N/A 12/24/2018   Procedure: OPEN REPAIR VENTRAL INCISIONAL HERNIA WITH MESH PATCH AND PRIMARY REPAIR OF LEFT SPIGELIAN HERNIA;  Surgeon: Armandina Gemma, MD;  Location: [facility];  Service: General;  Laterality: N/A;   KNEE ARTHROSCOPY  99, 02   Bil.   MYOMECTOMY N/A 11/10/2014   Procedure: MYOMECTOMY;  Surgeon: Everett Graff, MD;  Location: [place] [facility] [place] [facility];  Service: Gynecology;  Laterality: N/A;   PILONIDAL CYST EXCISION  2011, 2012   I&Ds   VENTRAL HERNIA REPAIR  11/27/2011   Procedure: LAPAROSCOPIC VENTRAL HERNIA;  Surgeon: Adin Hector, MD;  Location: [facility];  Service: General;  Laterality: N/A;  Laparoscopic Exploration & Repair of Hernia in Abdomen   WISDOM TOOTH EXTRACTION      SOCIAL HISTORY: Social History   Tobacco Use   Smoking status: Never Smoker   Smokeless tobacco: Never Used  Substance Use Topics   Alcohol use: No   Drug use: No    FAMILY HISTORY: Family History  Problem Relation Age of Onset   Aneurysm Mother        brain   Heart disease Mother 57       heart anyrism   Healthy Father        fairly   Diabetes Sister        half sister   Bipolar disorder Sister        half sister   Colon cancer Maternal Grandmother    Stomach cancer Paternal Grandfather    Multiple sclerosis Sister     ROS: Review of Systems  Constitutional: Positive for weight loss.  Gastrointestinal: Negative for nausea and vomiting.  Musculoskeletal:       Negative for muscle weakness    PHYSICAL EXAM: Blood pressure 103/70, pulse 74, temperature 98 F (36.7  C), temperature source Oral, height 5' 3"  (1.6 m), weight 212 lb (96.2 kg), SpO2 98 %. Body mass index is 37.55 kg/m. Physical Exam Vitals signs reviewed.  Constitutional:      Appearance: Normal appearance. She is well-developed. She is obese.  Cardiovascular:     Rate and Rhythm: Normal rate.  Pulmonary:     Effort: Pulmonary effort is normal.  Musculoskeletal: Normal range of motion.  Skin:    General: Skin is warm and dry.  Neurological:     Mental Status: She is alert and oriented to person, place, and  time.  Psychiatric:        Mood and Affect: Mood normal.        Behavior: Behavior normal.     RECENT LABS AND TESTS: BMET    Component Value Date/Time   NA 138 12/17/2018 0923   K 4.0 12/17/2018 0923   CL 101 12/17/2018 0923   CO2 23 12/17/2018 0923   GLUCOSE 82 12/17/2018 0923   GLUCOSE 77 07/23/2017 1606   BUN 12 12/17/2018 0923   CREATININE 0.72 12/17/2018 0923   CALCIUM 9.0 12/17/2018 0923   GFRNONAA 109 12/17/2018 0923   GFRAA 125 12/17/2018 0923   Lab Results  Component Value Date   HGBA1C 5.3 12/17/2018   HGBA1C 5.5 08/26/2018   HGBA1C 5.7 (H) 05/08/2018   HGBA1C 5.6 01/06/2018   HGBA1C 6.0 (H) 10/02/2017   Lab Results  Component Value Date   INSULIN 5.6 12/17/2018   INSULIN 14.2 08/26/2018   INSULIN 3.5 05/08/2018   INSULIN 10.1 01/06/2018   INSULIN 9.8 10/02/2017   CBC    Component Value Date/Time   WBC 11.8 (H) 12/23/2018 0843   RBC 4.89 12/23/2018 0843   HGB 13.8 12/23/2018 0843   HGB 11.2 10/02/2017 1245   HCT 44.9 12/23/2018 0843   HCT 36.9 10/02/2017 1245   PLT 363 12/23/2018 0843   MCV 91.8 12/23/2018 0843   MCV 82 10/02/2017 1245   MCH 28.2 12/23/2018 0843   MCHC 30.7 12/23/2018 0843   RDW 14.4 12/23/2018 0843   RDW 17.4 (H) 10/02/2017 1245   LYMPHSABS 3.9 (H) 10/02/2017 1245   MONOABS 1.1 (H) 07/23/2017 1606   EOSABS 0.2 10/02/2017 1245   BASOSABS 0.0 10/02/2017 1245   Iron/TIBC/Ferritin/ %Sat    Component Value  Date/Time   IRON 114 07/29/2013 0825   FERRITIN 14.8 07/29/2013 0825   IRONPCTSAT 36.8 07/29/2013 0825   Lipid Panel     Component Value Date/Time   CHOL 143 12/17/2018 0923   TRIG 53 12/17/2018 0923   HDL 50 12/17/2018 0923   CHOLHDL 3 07/23/2017 1606   VLDL 9.4 07/23/2017 1606   LDLCALC 82 12/17/2018 0923   Hepatic Function Panel     Component Value Date/Time   PROT 7.1 12/17/2018 0923   ALBUMIN 3.5 (L) 12/17/2018 0923   AST 11 12/17/2018 0923   ALT 11 12/17/2018 0923   ALKPHOS 75 12/17/2018 0923   BILITOT 0.3 12/17/2018 0923   BILIDIR 0.1 05/29/2016 0938      Component Value Date/Time   TSH 1.130 10/02/2017 1245   TSH 1.10 07/23/2017 1606   TSH 1.86 05/29/2016 0938     Ref. Range 12/17/2018 09:23  Vitamin D, 25-Hydroxy Latest Ref Range: 30.0 - 100.0 ng/mL 54.1    OBESITY BEHAVIORAL INTERVENTION VISIT  Today's visit was # 27  Starting weight: 293 lbs Starting date: 10/02/2017 Today's weight : 212 lbs Today's date: 03/05/2019 Total lbs lost to date: 81    03/05/2019  Height 5' 3"  (1.6 m)  Weight 212 lb (96.2 kg)  BMI (Calculated) 37.56  BLOOD PRESSURE - SYSTOLIC 597  BLOOD PRESSURE - DIASTOLIC 70   Body Fat % 41.6 %  Total Body Water (lbs) 94.2 lbs    ASK: We discussed the diagnosis of obesity with [name] today and [name] agreed to give [name] permission to discuss obesity behavioral modification therapy today.  ASSESS: [name] has the diagnosis of obesity and her BMI today is 37.56 [name] is in the action stage of change  ADVISE: [name] was educated on the multiple health risks of obesity as well as the benefit of weight loss to improve her health. She was advised of the need for long term treatment and the importance of lifestyle modifications to improve her current health and to decrease her risk of future health problems.  AGREE: Multiple dietary modification options and treatment options were discussed and  [name] agreed to follow the  recommendations documented in the above note.  ARRANGE: [name] was educated on the importance of frequent visits to treat obesity as outlined per CMS and USPSTF guidelines and agreed to schedule her next follow up appointment today.  Corey Skains, am acting as transcriptionist for Abby Potash, PA-C I, Abby Potash, PA-C have reviewed above note and agree with its content

## 2019-03-24 ENCOUNTER — Ambulatory Visit (INDEPENDENT_AMBULATORY_CARE_PROVIDER_SITE_OTHER): Payer: BC Managed Care – PPO | Admitting: Physician Assistant

## 2019-03-24 ENCOUNTER — Encounter (INDEPENDENT_AMBULATORY_CARE_PROVIDER_SITE_OTHER): Payer: Self-pay | Admitting: Physician Assistant

## 2019-03-24 ENCOUNTER — Other Ambulatory Visit: Payer: Self-pay

## 2019-03-24 VITALS — BP 98/64 | HR 83 | Temp 98.5°F | Ht 63.0 in | Wt 209.0 lb

## 2019-03-24 DIAGNOSIS — E7849 Other hyperlipidemia: Secondary | ICD-10-CM

## 2019-03-24 DIAGNOSIS — R7303 Prediabetes: Secondary | ICD-10-CM | POA: Diagnosis not present

## 2019-03-24 DIAGNOSIS — E559 Vitamin D deficiency, unspecified: Secondary | ICD-10-CM | POA: Diagnosis not present

## 2019-03-24 DIAGNOSIS — Z9189 Other specified personal risk factors, not elsewhere classified: Secondary | ICD-10-CM | POA: Diagnosis not present

## 2019-03-24 DIAGNOSIS — Z6837 Body mass index (BMI) 37.0-37.9, adult: Secondary | ICD-10-CM

## 2019-03-24 MED ORDER — VITAMIN D (ERGOCALCIFEROL) 1.25 MG (50000 UNIT) PO CAPS: 50000.0000 [IU] | ORAL_CAPSULE | ORAL | 0 refills | Status: DC

## 2019-03-24 MED ORDER — METFORMIN HCL 500 MG PO TABS: 500.0000 mg | ORAL_TABLET | Freq: Every day | ORAL | 0 refills | Status: DC

## 2019-03-24 NOTE — Progress Notes (Signed)
Office: [phone]  /  Fax: [phone]   HPI:   Chief Complaint: OBESITY [name] is here to discuss her progress with her obesity treatment plan. She is on the Category 3 plan and is following her eating plan approximately 95 % of the time. She states she is walking 30 minutes 7 times per week. Dally reports that she has done well with journaling by keeping her calories between 1400 to 1500 and protein on most days greater than 95. She has birthday coming up and wants to talk about strategies.  Her weight is 209 lb (94.8 kg) today and has had a weight loss of 3 pounds over a period of 3 weeks since her last visit. She has lost 84 lbs since starting treatment with [name].  Hyperlipidemia [name] has hyperlipidemia and is not taking medication. She has been trying to improve her cholesterol levels with intensive lifestyle modification including a low saturated fat diet, exercise and weight loss. She denies any chest pain.  Vitamin D Deficiency [name] has a diagnosis of vitamin D deficiency. She is currently on vit D. [name] denies nausea, vomiting, or muscle weakness.  At risk for osteopenia and osteoporosis [name] is at higher risk of osteopenia and osteoporosis due to vitamin D deficiency.   Pre-Diabetes [name] has a diagnosis of pre-diabetes based on her elevated Hgb A1c and was informed this puts her at greater risk of developing diabetes. She is taking metformin currently and continues to work on diet and exercise to decrease risk of diabetes. She denies nausea, vomiting, or diarrhea.   ASSESSMENT AND PLAN:  Other hyperlipidemia - Plan: Lipid Panel With LDL/HDL Ratio  Vitamin D deficiency - Plan: VITAMIN D 25 Hydroxy (Vit-D Deficiency, Fractures), Vitamin D, Ergocalciferol, (DRISDOL) 1.25 MG (50000 UT) CAPS capsule  Prediabetes - Plan: Comprehensive metabolic panel, Hemoglobin A1c, Insulin, random, metFORMIN (GLUCOPHAGE) 500 MG tablet  At risk for osteoporosis  Class 2 severe  obesity with serious comorbidity and body mass index (BMI) of 37.0 to 37.9 in adult, unspecified obesity type ([facility])  PLAN:  Hyperlipidemia [name] was informed of the American Heart Association Guidelines emphasizing intensive lifestyle modifications as the first line treatment for hyperlipidemia. We discussed many lifestyle modifications today in depth, and Joseph will continue to work on decreasing saturated fats such as fatty red meat, butter and many fried foods. She will also increase vegetables and lean protein in her diet and continue to work on exercise and weight loss efforts. [name] agrees to continue with weight loss and she will follow up in 2 weeks.  Vitamin D Deficiency [name] was informed that low vitamin D levels contribute to fatigue and are associated with obesity, breast, and colon cancer. [name] agrees to continue to take prescription Vit D @50 ,000 IU every 14 days #2 with no refills and will follow up for routine testing of vitamin D, at least 2-3 times per year. She was informed of the risk of over-replacement of vitamin D and agrees to not increase her dose unless she discusses this with [name] first. [name] agrees to follow up in 3 weeks as directed.  At risk for osteopenia and osteoporosis [name] was given extended (15 minutes) osteoporosis prevention counseling today. [name] is at risk for osteopenia and osteoporosis due to her vitamin D deficiency. She was encouraged to take her vitamin D and follow her higher calcium diet and increase strengthening exercise to help strengthen her bones and decrease her risk of osteopenia and osteoporosis.  Pre-Diabetes [name] will continue to work  on weight loss, exercise, and decreasing simple carbohydrates in her diet to help decrease the risk of diabetes. She was informed that eating too many simple carbohydrates or too many calories at one sitting increases the likelihood of GI side effects. [name] agreed to continue metformin 500 mg qAM #30  with no refills and a prescription was written today. [name] agreed to follow up with [name] as directed to monitor her progress in 3 weeks.   Obesity [name] is currently in the action stage of change. As such, her goal is to continue with weight loss efforts. She has agreed to keep a food journal with 1500 calories and 95 grams of protein.  [name] has been instructed to work up to a goal of 150 minutes of combined cardio and strengthening exercise per week for weight loss and overall health benefits. We discussed the following Behavioral Modification Strategies today: work on meal planning and easy cooking plans and keeping healthy foods in the home.  [name] has agreed to follow up with our clinic in 3 weeks. She was informed of the importance of frequent follow up visits to maximize her success with intensive lifestyle modifications for her multiple health conditions.  ALLERGIES: No Known Allergies  MEDICATIONS: Current Outpatient Medications on File Prior to Visit  Medication Sig Dispense Refill  . Calcium Carb-Cholecalciferol (CALCIUM 600+D3 PO) Take 1 tablet by mouth daily.    . fexofenadine (ALLEGRA) 180 MG tablet Take 180 mg by mouth daily.    [name] ibuprofen (ADVIL) 200 MG tablet Take 200 mg by mouth every 6 (six) hours as needed for headache or moderate pain.    . InFLIXimab (REMICADE IV) Inject into the vein every 8 (eight) weeks.     . Multiple Vitamin (MULTIVITAMIN PO) Take 1 tablet by mouth daily.    [name] tetrahydrozoline (VISINE) 0.05 % ophthalmic solution Place 2 drops into both eyes daily as needed (for dry eyes).     No current facility-administered medications on file prior to visit.     PAST MEDICAL HISTORY: Past Medical History:  Diagnosis Date  . Abrasion of upper arm with infection, left, sequela    started antibiotics lef arm oct 4th started antibiotics healing well  . Anemia    hx of 2 yrs ago  . Chicken pox   . Chronic rhinitis   . Contact dermatitis and other  eczema, due to unspecified cause   . Crohn's 11-15-11   no issues in 2 yrs  . Fibroids 11-15-11   remains with fibroids  . Hernia   . Incisional hernia, RLQ 10/02/2011  . Infertility, female   . Mononucleosis   . Obesity   . Pilonidal abscess   . Rectal abscess   . 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 Vitamin D deficiency     PAST SURGICAL HISTORY: Past Surgical History:  Procedure Laterality Date  . APPENDECTOMY  7/06  . COLONOSCOPY WITH PROPOFOL N/A 03/07/2017   Procedure: COLONOSCOPY WITH PROPOFOL;  Surgeon: Milus Banister, MD;  Location: [facility];  Service: Endoscopy;  Laterality: N/A;  . EYE SURGERY  11-15-11   2003-multiple stys  . ileocecal resection  1/11   crohns disease with fisula/stricture. Dr. Ronnald Collum  . INCISIONAL HERNIA REPAIR N/A 12/24/2018   Procedure: OPEN REPAIR VENTRAL INCISIONAL HERNIA WITH MESH PATCH AND PRIMARY REPAIR OF LEFT SPIGELIAN HERNIA;  Surgeon: Armandina Gemma, MD;  Location: [facility];  Service: General;  Laterality: N/A;  . KNEE ARTHROSCOPY  99, 02  Bil.  [name] MYOMECTOMY N/A 11/10/2014   Procedure: MYOMECTOMY;  Surgeon: Everett Graff, MD;  Location: [place] [facility];  Service: Gynecology;  Laterality: N/A;  . PILONIDAL CYST EXCISION  2011, 2012   I&Ds  . VENTRAL HERNIA REPAIR  11/27/2011   Procedure: LAPAROSCOPIC VENTRAL HERNIA;  Surgeon: Adin Hector, MD;  Location: [facility];  Service: General;  Laterality: N/A;  Laparoscopic Exploration & Repair of Hernia in Abdomen  . WISDOM TOOTH EXTRACTION      SOCIAL HISTORY: Social History   Tobacco Use  . Smoking status: Never Smoker  . Smokeless tobacco: Never Used  Substance Use Topics  . Alcohol use: No  . Drug use: No    FAMILY HISTORY: Family History  Problem Relation Age of Onset  . Aneurysm Mother        brain  . Heart disease Mother 43       heart anyrism  . Healthy Father        fairly  . Diabetes Sister        half sister  . Bipolar disorder Sister        half sister  .  Colon cancer Maternal Grandmother   . Stomach cancer Paternal Grandfather   . Multiple sclerosis Sister     ROS: Review of Systems  Constitutional: Positive for weight loss.  Cardiovascular: Negative for chest pain.  Gastrointestinal: Negative for diarrhea, nausea and vomiting.  Musculoskeletal:       Negative for muscle weakness.    PHYSICAL EXAM: Blood pressure 98/64, pulse 83, temperature 98.5 F (36.9 C), temperature source Oral, height 5' 3"  (1.6 m), weight 209 lb (94.8 kg), SpO2 96 %. Body mass index is 37.02 kg/m. Physical Exam Vitals signs reviewed.  Constitutional:      Appearance: Normal appearance. She is obese.  Cardiovascular:     Rate and Rhythm: Normal rate.  Pulmonary:     Effort: Pulmonary effort is normal.  Musculoskeletal: Normal range of motion.  Skin:    General: Skin is warm and dry.  Neurological:     Mental Status: She is alert and oriented to person, place, and time.  Psychiatric:        Mood and Affect: Mood normal.        Behavior: Behavior normal.     RECENT LABS AND TESTS: BMET    Component Value Date/Time   NA 138 12/17/2018 0923   K 4.0 12/17/2018 0923   CL 101 12/17/2018 0923   CO2 23 12/17/2018 0923   GLUCOSE 82 12/17/2018 0923   GLUCOSE 77 07/23/2017 1606   BUN 12 12/17/2018 0923   CREATININE 0.72 12/17/2018 0923   CALCIUM 9.0 12/17/2018 0923   GFRNONAA 109 12/17/2018 0923   GFRAA 125 12/17/2018 0923   Lab Results  Component Value Date   HGBA1C 5.3 12/17/2018   HGBA1C 5.5 08/26/2018   HGBA1C 5.7 (H) 05/08/2018   HGBA1C 5.6 01/06/2018   HGBA1C 6.0 (H) 10/02/2017   Lab Results  Component Value Date   INSULIN 5.6 12/17/2018   INSULIN 14.2 08/26/2018   INSULIN 3.5 05/08/2018   INSULIN 10.1 01/06/2018   INSULIN 9.8 10/02/2017   CBC    Component Value Date/Time   WBC 11.8 (H) 12/23/2018 0843   RBC 4.89 12/23/2018 0843   HGB 13.8 12/23/2018 0843   HGB 11.2 10/02/2017 1245   HCT 44.9 12/23/2018 0843   HCT 36.9  10/02/2017 1245   PLT 363 12/23/2018 0843   MCV 91.8 12/23/2018 0843  MCV 82 10/02/2017 1245   MCH 28.2 12/23/2018 0843   MCHC 30.7 12/23/2018 0843   RDW 14.4 12/23/2018 0843   RDW 17.4 (H) 10/02/2017 1245   LYMPHSABS 3.9 (H) 10/02/2017 1245   MONOABS 1.1 (H) 07/23/2017 1606   EOSABS 0.2 10/02/2017 1245   BASOSABS 0.0 10/02/2017 1245   Iron/TIBC/Ferritin/ %Sat    Component Value Date/Time   IRON 114 07/29/2013 0825   FERRITIN 14.8 07/29/2013 0825   IRONPCTSAT 36.8 07/29/2013 0825   Lipid Panel     Component Value Date/Time   CHOL 143 12/17/2018 0923   TRIG 53 12/17/2018 0923   HDL 50 12/17/2018 0923   CHOLHDL 3 07/23/2017 1606   VLDL 9.4 07/23/2017 1606   LDLCALC 82 12/17/2018 0923   Hepatic Function Panel     Component Value Date/Time   PROT 7.1 12/17/2018 0923   ALBUMIN 3.5 (L) 12/17/2018 0923   AST 11 12/17/2018 0923   ALT 11 12/17/2018 0923   ALKPHOS 75 12/17/2018 0923   BILITOT 0.3 12/17/2018 0923   BILIDIR 0.1 05/29/2016 0938      Component Value Date/Time   TSH 1.130 10/02/2017 1245   TSH 1.10 07/23/2017 1606   TSH 1.86 05/29/2016 0938   Results for VERBIE, BABIC (MRN [id number]) as of 03/24/2019 16:54  Ref. Range 12/17/2018 09:23  Vitamin D, 25-Hydroxy Latest Ref Range: 30.0 - 100.0 ng/mL 54.1   OBESITY BEHAVIORAL INTERVENTION VISIT  Today's visit was # 28   Starting weight: 293 lbs Starting date: 10/02/2017 Today's weight : Weight: 209 lb (94.8 kg)  Today's date: 03/24/2019 Total lbs lost to date: 84    03/24/2019  Height 5' 3"  (1.6 m)  Weight 209 lb (94.8 kg)  BMI (Calculated) 37.03  BLOOD PRESSURE - SYSTOLIC 98  BLOOD PRESSURE - DIASTOLIC 64   Body Fat % 18.8 %  Total Body Water (lbs) 88 lbs   ASK: We discussed the diagnosis of obesity with [name] today and Laurella agreed to give [name] permission to discuss obesity behavioral modification therapy today.  ASSESS: Salena has the diagnosis of obesity and her BMI today is  37.03. Ikeya is in the action stage of change.   ADVISE: [place] was educated on the multiple health risks of obesity as well as the benefit of weight loss to improve her health. She was advised of the need for long term treatment and the importance of lifestyle modifications to improve her current health and to decrease her risk of future health problems.  AGREE: Multiple dietary modification options and treatment options were discussed and Chamara agreed to follow the recommendations documented in the above note.  ARRANGE: Verlena was educated on the importance of frequent visits to treat obesity as outlined per CMS and USPSTF guidelines and agreed to schedule her next follow up appointment today.  Lenward Chancellor, CMA, am acting as transcriptionist for Abby Potash, PA-C I, Abby Potash, PA-C have reviewed above note and agree with its content

## 2019-03-25 LAB — COMPREHENSIVE METABOLIC PANEL
ALT: 12 IU/L (ref 0–32)
AST: 13 IU/L (ref 0–40)
Albumin/Globulin Ratio: 1.1 — ABNORMAL LOW (ref 1.2–2.2)
Albumin: 3.6 g/dL — ABNORMAL LOW (ref 3.8–4.8)
Alkaline Phosphatase: 79 IU/L (ref 39–117)
BUN/Creatinine Ratio: 22 (ref 9–23)
BUN: 14 mg/dL (ref 6–20)
Bilirubin Total: 0.2 mg/dL (ref 0.0–1.2)
CO2: 23 mmol/L (ref 20–29)
Calcium: 8.9 mg/dL (ref 8.7–10.2)
Chloride: 104 mmol/L (ref 96–106)
Creatinine, Ser: 0.64 mg/dL (ref 0.57–1.00)
GFR calc Af Amer: 134 mL/min/{1.73_m2} (ref >59)
GFR calc non Af Amer: 116 mL/min/{1.73_m2} (ref >59)
Globulin, Total: 3.3 g/dL (ref 1.5–4.5)
Glucose: 80 mg/dL (ref 65–99)
Potassium: 4.2 mmol/L (ref 3.5–5.2)
Sodium: 138 mmol/L (ref 134–144)
Total Protein: 6.9 g/dL (ref 6.0–8.5)

## 2019-03-25 LAB — LIPID PANEL WITH LDL/HDL RATIO
Cholesterol, Total: 145 mg/dL (ref 100–199)
HDL: 46 mg/dL (ref >39)
LDL Chol Calc (NIH): 89 mg/dL (ref 0–99)
LDL/HDL Ratio: 1.9 ratio (ref 0.0–3.2)
Triglycerides: 45 mg/dL (ref 0–149)
VLDL Cholesterol Cal: 10 mg/dL (ref 5–40)

## 2019-03-25 LAB — INSULIN, RANDOM: INSULIN: 4.2 u[IU]/mL (ref 2.6–24.9)

## 2019-03-25 LAB — HEMOGLOBIN A1C
Est. average glucose Bld gHb Est-mCnc: 103 mg/dL
Hgb A1c MFr Bld: 5.2 % (ref 4.8–5.6)

## 2019-03-25 LAB — VITAMIN D 25 HYDROXY (VIT D DEFICIENCY, FRACTURES): Vit D, 25-Hydroxy: 54.5 ng/mL (ref 30.0–100.0)

## 2019-04-08 ENCOUNTER — Other Ambulatory Visit (INDEPENDENT_AMBULATORY_CARE_PROVIDER_SITE_OTHER): Payer: BC Managed Care – PPO

## 2019-04-08 ENCOUNTER — Encounter: Payer: Self-pay | Admitting: Gastroenterology

## 2019-04-08 ENCOUNTER — Other Ambulatory Visit: Payer: Self-pay

## 2019-04-08 ENCOUNTER — Ambulatory Visit (INDEPENDENT_AMBULATORY_CARE_PROVIDER_SITE_OTHER): Payer: BC Managed Care – PPO | Admitting: Gastroenterology

## 2019-04-08 VITALS — BP 108/62 | HR 66 | Temp 97.9°F | Wt 213.0 lb

## 2019-04-08 DIAGNOSIS — K5090 Crohn's disease, unspecified, without complications: Secondary | ICD-10-CM

## 2019-04-08 DIAGNOSIS — Z23 Encounter for immunization: Secondary | ICD-10-CM | POA: Diagnosis not present

## 2019-04-08 NOTE — Progress Notes (Signed)
Review of gastrointestinal problems:  1. Crohn ileocolitis.Presented like an acute appendicitis 2006. Surgery, not in [place], was much more suggestive of Crohn's disease. 02/2005 colonoscopy Dr. Harrold Donath to cecum inflammation (erosions, granularity, TI nodular, friable, ulcerated. Path showed chronic, active TI inflammation; normal colon biopsies. She did not respond to immunomodulators and so eventually she Began Remicade April 2007 While continuing on azathioprine. Early 2008, stopped azathioprine and she tolerated solo maintenance Remicade (5 mg per kilogram) well. April 2009 continues to tolerate Remicade infusions very well, getting her infusions at Dr. Tanna Furry office. 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 Her Remicade dosing was increased to 10 mg per kilogram. Winter, 2010 presented with right lower quadrant abscess, clear fistula connection between small bowel and the abscess. 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 Infection was drained with interventional radiology, drain eventually pulled however abscess recurred within days and so right lower quadrant drain was replaced. January, 2011: she is on azathioprine 100 mg a day, Augmentin twice daily, planning for surgery late January. January, 2011: ileocecectomy performed for active disease, persistent fistula, abscess. April, 2012 doing well on Remicade every 8 weeks.  10/2012 doing well. 01/2016 doing well on remicade 78m/kg every 8 weeks Dr. BMelissa Noonoffice.  Colonoscopy Dr. JArdis Hughs10/2018 showed normal, widely patent ileocecal anastomosis.  A few small erosions were noted just proximal to the IC anastomosis.  The colon was normal.  Pathology showed mild colonic inflammation in the small bowel only.  Colon biopsies were normal. 2. May, 2011: polynidol cystdrained by urgent care; drained another time  2011. eventually surgically resected spring 2012 3. Morbid obesity; BMI 49 2017; BMI 53 2018 BMI 47 2019 4.  July 2020 open repair of ventral incisional hernia, also primary repair of left split galea hernia Dr. GHarlow Asa HPI: This is a very pleasant 38year old woman whom I last saw a little over a year ago.  She is here today for routine Crohn's follow-up.  She is on Remicade  10 mg/kg IV infusion monotherapy every 8 weeks.  She has been on this dose for many years and is doing quite well.  Colonoscopy 2 years ago as above.  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  She has intentionally lost about 50 pounds since her last visit here a little over a year ago.  Her bowels are formed, soft, brown nonbloody.  She does not have significant abdominal pains.  She does not have significant diarrhea or constipation.  For the past day or so she has had urinary frequency which often is a sign of urinary traction for her which she gets about once a year.  No dysuria.  Chief complaint is Crohn's ileocolitis  ROS: complete GI ROS as described in HPI, all other review negative.  Constitutional:  No unintentional weight loss   Past Medical History:  Diagnosis Date  . Abrasion of upper arm with infection, left, sequela    started antibiotics lef arm oct 4th started antibiotics healing well  . Anemia    hx of 2 yrs ago  . Chicken pox   . Chronic rhinitis   . Contact dermatitis and other eczema, due to unspecified cause   . Crohn's 11-15-11   no issues in 2 yrs  . Fibroids 11-15-11   remains with fibroids  .  Hernia   . Incisional hernia, RLQ 10/02/2011  . Infertility, female   . Mononucleosis   . Obesity   . Pilonidal abscess   . Rectal abscess   . 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 Vitamin D deficiency     Past Surgical History:  Procedure Laterality Date  . APPENDECTOMY  7/06  . COLONOSCOPY  WITH PROPOFOL N/A 03/07/2017   Procedure: COLONOSCOPY WITH PROPOFOL;  Surgeon: Milus Banister, MD;  Location: [facility];  Service: Endoscopy;  Laterality: N/A;  . EYE SURGERY  11-15-11   2003-multiple stys  . ileocecal resection  1/11   crohns disease with fisula/stricture. Dr. Ronnald Collum  . INCISIONAL HERNIA REPAIR N/A 12/24/2018   Procedure: OPEN REPAIR VENTRAL INCISIONAL HERNIA WITH MESH PATCH AND PRIMARY REPAIR OF LEFT SPIGELIAN HERNIA;  Surgeon: Armandina Gemma, MD;  Location: [facility];  Service: General;  Laterality: N/A;  . KNEE ARTHROSCOPY  99, 02   Bil.  [name] MYOMECTOMY N/A 11/10/2014   Procedure: MYOMECTOMY;  Surgeon: Everett Graff, MD;  Location: [facility];  Service: Gynecology;  Laterality: N/A;  . PILONIDAL CYST EXCISION  2011, 2012   I&Ds  . VENTRAL HERNIA REPAIR  11/27/2011   Procedure: LAPAROSCOPIC VENTRAL HERNIA;  Surgeon: Adin Hector, MD;  Location: [facility];  Service: General;  Laterality: N/A;  Laparoscopic Exploration & Repair of Hernia in Abdomen  . WISDOM TOOTH EXTRACTION      Current Outpatient Medications  Medication Sig Dispense Refill  . Calcium Carb-Cholecalciferol (CALCIUM 600+D3 PO) Take 1 tablet by mouth daily.    . fexofenadine (ALLEGRA) 180 MG tablet Take 180 mg by mouth daily.    . InFLIXimab (REMICADE IV) Inject into the vein every 8 (eight) weeks.     . metFORMIN (GLUCOPHAGE) 500 MG tablet Take 1 tablet (500 mg total) by mouth daily with breakfast. 30 tablet 0  . Multiple Vitamin (MULTIVITAMIN PO) Take 1 tablet by mouth daily.    . Vitamin D, Ergocalciferol, (DRISDOL) 1.25 MG (50000 UT) CAPS capsule Take 1 capsule (50,000 Units total) by mouth every 14 (fourteen) days. 2 capsule 0   No current facility-administered medications for this visit.     Allergies as of 04/08/2019  . (No Known Allergies)    Family History  Problem Relation Age of Onset  . Aneurysm Mother        brain  . Heart disease Mother 35       heart anyrism  . Healthy Father         fairly  . Diabetes Sister        half sister  . Bipolar disorder Sister        half sister  . Colon cancer Maternal Grandmother   . Stomach cancer Paternal Grandfather   . Multiple sclerosis Sister     Social History   Socioeconomic History  . Marital status: Married    Spouse name: Sakiya Stepka  . Number of children: Not on file  . Years of education: Not on file  . Highest education level: Not on file  Occupational History  . Occupation: Music therapist  Social Needs  . Financial resource strain: Not on file  . Food insecurity    Worry: Not on file    Inability: Not on file  . Transportation needs    Medical: Not on file    Non-medical: Not on file  Tobacco Use  . Smoking status: Never Smoker  . Smokeless tobacco: Never Used  Substance and Sexual Activity  . Alcohol use: No  . Drug use: No  . Sexual activity: Yes    Partners: Male  Lifestyle  . Physical activity    Days per week: Not on file    Minutes per session: Not on file  . Stress: Not on file  Relationships  . Social Herbalist on phone: Not on file    Gets together: Not on file    Attends religious service: Not on file    Active member of club or organization: Not on file    Attends meetings of clubs or organizations: Not on file    Relationship status: Not on file  . Intimate partner violence    Fear of current or ex partner: Not on file    Emotionally abused: Not on file    Physically abused: Not on file    Forced sexual activity: Not on file  Other Topics Concern  . Not on file  Social History Narrative   married   HH of 2   Occupation: Music therapist [employer] bs in math    No pets   Sleep 6-7 hours    BF with 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   Walking exercise   Eats balanced generally     Physical Exam: BP 108/62   Pulse 66   Temp 97.9 F (36.6 C) (Temporal)   Wt 213 lb (96.6 kg)   BMI 37.73 kg/m  Constitutional: Obese, otherwise generally  well-appearing Psychiatric: alert and oriented x3 Abdomen: soft, nontender, nondistended, no obvious ascites, no peritoneal signs, normal bowel sounds No peripheral edema noted in lower extremities  Assessment and plan: 38 y.o. female with Crohn's ileocolitis  She is continuing on bariatric MD supportive program and has lost about 80 pounds since starting it about a year ago.  She wants to lose another 20 or so pounds.  I congratulated her on her effort and her success in this.  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  She is at moderate risk because of her immune suppression.  She plans to wear a mask always as well as eye protection.  She is going to keep at least 6 feet between her and any of the kids or coworkers.  She wanted a work note today which I am happy to give to her about wearing hair protection as well as the fact that she gets her Remicade every 2 months and will need time off for that as usual.  We will see if she has had TB annual test checked by [facility]ology yet and if not we will do that for her.  She is going to get a flu shot today here.  I will send her to the lab for urinalysis and reflex culture given her urinary frequency which often precedes the urinary tract infection.  She will return to see me in 12 months and sooner if any problems.  Please see the "Patient Instructions" section for addition details about the plan.  Owens Loffler, MD [facility] 04/08/2019, 4:00 PM

## 2019-04-08 NOTE — Patient Instructions (Signed)
If you are age 36 or older, your body mass index should be between 23-30. Your Body mass index is 37.73 kg/m. If this is out of the aforementioned range listed, please consider follow up with your Primary Care Provider.  If you are age 35 or younger, your body mass index should be between 19-25. Your Body mass index is 37.73 kg/m. If this is out of the aformentioned range listed, please consider follow up with your Primary Care Provider.   Your provider has requested that you go to the basement level for lab work before leaving today. Press "B" on the elevator. The lab is located at the first door on the left as you exit the elevator.

## 2019-04-09 LAB — URINALYSIS, ROUTINE W REFLEX MICROSCOPIC
Bilirubin Urine: NEGATIVE
Ketones, ur: NEGATIVE
Leukocytes,Ua: NEGATIVE
Nitrite: NEGATIVE
Specific Gravity, Urine: 1.015 (ref 1.000–1.030)
Total Protein, Urine: NEGATIVE
Urine Glucose: NEGATIVE
Urobilinogen, UA: 0.2 (ref 0.0–1.0)
pH: 6.5 (ref 5.0–8.0)

## 2019-04-14 ENCOUNTER — Ambulatory Visit (INDEPENDENT_AMBULATORY_CARE_PROVIDER_SITE_OTHER): Payer: BC Managed Care – PPO | Admitting: Physician Assistant

## 2019-04-14 ENCOUNTER — Other Ambulatory Visit: Payer: Self-pay

## 2019-04-14 ENCOUNTER — Encounter (INDEPENDENT_AMBULATORY_CARE_PROVIDER_SITE_OTHER): Payer: Self-pay | Admitting: Physician Assistant

## 2019-04-14 VITALS — BP 100/64 | HR 90 | Temp 98.1°F | Ht 63.0 in | Wt 209.0 lb

## 2019-04-14 DIAGNOSIS — Z9189 Other specified personal risk factors, not elsewhere classified: Secondary | ICD-10-CM | POA: Diagnosis not present

## 2019-04-14 DIAGNOSIS — R7303 Prediabetes: Secondary | ICD-10-CM

## 2019-04-14 DIAGNOSIS — E559 Vitamin D deficiency, unspecified: Secondary | ICD-10-CM | POA: Diagnosis not present

## 2019-04-14 DIAGNOSIS — Z6837 Body mass index (BMI) 37.0-37.9, adult: Secondary | ICD-10-CM

## 2019-04-15 NOTE — Progress Notes (Signed)
Office: [phone]  /  Fax: [phone]   HPI:   Chief Complaint: OBESITY [name] is here to discuss her progress with her obesity treatment plan. She is on the Category 3 plan and is following her eating plan approximately 95 % of the time. She states she is exercising 0 minutes 0 times per week. [name] reports that she hasn't been able to work out, due to injuring her hip. She is following the plan well. Her weight is 209 lb (94.8 kg) today and she has maintained weight since her last visit. She has lost 84 lbs since starting treatment with [name].  Vitamin D deficiency [name] has a diagnosis of vitamin D deficiency. [name] is on vit D and she denies nausea, vomiting or muscle weakness. She has no excessive fatigue.  Pre-Diabetes [name] has a diagnosis of prediabetes based on her elevated Hgb A1c and was informed this puts her at greater risk of developing diabetes. [name] is taking metformin currently and she denies nausea, vomiting, diarrhea or polyphagia. Her last A1c was at 5.2 and her last insulin level was at 4.2 (03/24/19). She continues to work on diet and exercise to decrease risk of diabetes.  At risk for diabetes [name] is at higher than average risk for developing diabetes due to her obesity and prediabetes. She currently denies polyuria or polydipsia.  ASSESSMENT AND PLAN:  Vitamin D deficiency  Prediabetes  At risk for diabetes mellitus  Class 2 severe obesity with serious comorbidity and body mass index (BMI) of 37.0 to 37.9 in adult, unspecified obesity type ([place])  PLAN:  Vitamin D Deficiency Aleina was informed that low vitamin D levels contributes to fatigue and are associated with obesity, breast, and colon cancer. Keiona agrees to change to OTC vitamin D 4,000 IU daily and she will follow up for routine testing of vitamin D, at least 2-3 times per year. She was informed of the risk of over-replacement of vitamin D and agrees to not increase her dose unless she  discusses this with [name] first. Heily agrees to follow up with our clinic in 3 weeks.  Pre-Diabetes [name] will continue to work on weight loss, exercise, and decreasing simple carbohydrates in her diet to help decrease the risk of diabetes. We dicussed metformin including benefits and risks. She was informed that eating too many simple carbohydrates or too many calories at one sitting increases the likelihood of GI side effects. [name] agrees to discontinue metformin and follow up with [name] as directed to monitor her progress.  Diabetes risk counseling [name] was given extended (15 minutes) diabetes prevention counseling today. She is 38 y.o. female and has risk factors for diabetes including obesity and prediabetes. We discussed intensive lifestyle modifications today with an emphasis on weight loss as well as increasing exercise and decreasing simple carbohydrates in her diet.  Obesity [name] is currently in the action stage of change. As such, her goal is to continue with weight loss efforts She has agreed to keep a food journal with 1400 to 1500 calories and 95 grams of protein daily [name] has been instructed to work up to a goal of 150 minutes of combined cardio and strengthening exercise per week for weight loss and overall health benefits. We discussed the following Behavioral Modification Strategies today: keeping healthy foods in the home and work on meal planning and easy cooking plans  [name] has agreed to follow up with our clinic in 3 weeks. She was informed of the importance of frequent follow up visits  to maximize her success with intensive lifestyle modifications for her multiple health conditions.  ALLERGIES: No Known Allergies  MEDICATIONS: Current Outpatient Medications on File Prior to Visit  Medication Sig Dispense Refill   Vitamin D, Cholecalciferol, 50 MCG (2000 UT) CAPS Take 4,000 Units by mouth daily.     Calcium Carb-Cholecalciferol (CALCIUM 600+D3 PO) Take 1 tablet  by mouth daily.     fexofenadine (ALLEGRA) 180 MG tablet Take 180 mg by mouth daily.     InFLIXimab (REMICADE IV) Inject into the vein every 8 (eight) weeks.      Multiple Vitamin (MULTIVITAMIN PO) Take 1 tablet by mouth daily.     No current facility-administered medications on file prior to visit.     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 Dr. Ronnald Collum   INCISIONAL HERNIA REPAIR N/A 12/24/2018   Procedure: OPEN REPAIR VENTRAL INCISIONAL HERNIA WITH MESH PATCH AND PRIMARY REPAIR OF LEFT SPIGELIAN HERNIA;  Surgeon: Armandina Gemma, MD;  Location: [facility];  Service: General;  Laterality: N/A;   KNEE ARTHROSCOPY  99, 02   Bil.   MYOMECTOMY N/A 11/10/2014   Procedure: MYOMECTOMY;  Surgeon: Everett Graff, MD;  Location: [facility];  Service: Gynecology;  Laterality: N/A;   PILONIDAL CYST EXCISION  2011, 2012   I&Ds   VENTRAL HERNIA REPAIR  11/27/2011   Procedure: LAPAROSCOPIC VENTRAL HERNIA;  Surgeon: Adin Hector,  MD;  Location: [facility];  Service: General;  Laterality: N/A;  Laparoscopic Exploration & Repair of Hernia in Abdomen   WISDOM TOOTH EXTRACTION      SOCIAL HISTORY: Social History   Tobacco Use   Smoking status: Never Smoker   Smokeless tobacco: Never Used  Substance Use Topics   Alcohol use: No   Drug use: No    FAMILY HISTORY: Family History  Problem Relation Age of Onset   Aneurysm Mother        brain   Heart disease Mother 36       heart anyrism   Healthy Father        fairly   Diabetes Sister        half sister   Bipolar disorder Sister        half sister   Colon cancer Maternal Grandmother    Stomach cancer Paternal Grandfather    Multiple sclerosis Sister  ROS: Review of Systems  Constitutional: Negative for malaise/fatigue and weight loss.  Gastrointestinal: Negative for diarrhea, nausea and vomiting.  Genitourinary: Negative for frequency.  Musculoskeletal:       Negative for muscle weakness  Endo/Heme/Allergies: Negative for polydipsia.       Negative for polyphagia    PHYSICAL EXAM: Blood pressure 100/64, pulse 90, temperature 98.1 F (36.7 C), temperature source Oral, height 5' 3"  (1.6 m), weight 209 lb (94.8 kg), SpO2 98 %. Body mass index is 37.02 kg/m. Physical Exam Vitals signs reviewed.  Constitutional:      Appearance: Normal appearance. She is well-developed. She is obese.  Cardiovascular:     Rate and Rhythm: Normal rate.  Pulmonary:     Effort: Pulmonary effort is normal.  Musculoskeletal: Normal range of motion.  Skin:    General: Skin is warm and dry.  Neurological:     Mental Status: She is alert and oriented to person, place, and time.  Psychiatric:        Mood and Affect: Mood normal.        Behavior: Behavior normal.     RECENT LABS AND TESTS: BMET    Component Value Date/Time   NA 138 03/24/2019 0730   K 4.2 03/24/2019 0730   CL 104 03/24/2019 0730   CO2 23 03/24/2019 0730   GLUCOSE 80 03/24/2019  0730   GLUCOSE 77 07/23/2017 1606   BUN 14 03/24/2019 0730   CREATININE 0.64 03/24/2019 0730   CALCIUM 8.9 03/24/2019 0730   GFRNONAA 116 03/24/2019 0730   GFRAA 134 03/24/2019 0730   Lab Results  Component Value Date   HGBA1C 5.2 03/24/2019   HGBA1C 5.3 12/17/2018   HGBA1C 5.5 08/26/2018   HGBA1C 5.7 (H) 05/08/2018   HGBA1C 5.6 01/06/2018   Lab Results  Component Value Date   INSULIN 4.2 03/24/2019   INSULIN 5.6 12/17/2018   INSULIN 14.2 08/26/2018   INSULIN 3.5 05/08/2018   INSULIN 10.1 01/06/2018   CBC    Component Value Date/Time   WBC 11.8 (H) 12/23/2018 0843   RBC 4.89 12/23/2018 0843   HGB 13.8 12/23/2018 0843   HGB 11.2 10/02/2017 1245   HCT 44.9 12/23/2018 0843   HCT 36.9 10/02/2017 1245   PLT 363 12/23/2018 0843   MCV 91.8 12/23/2018 0843   MCV 82 10/02/2017 1245   MCH 28.2 12/23/2018 0843   MCHC 30.7 12/23/2018 0843   RDW 14.4 12/23/2018 0843   RDW 17.4 (H) 10/02/2017 1245   LYMPHSABS 3.9 (H) 10/02/2017 1245   MONOABS 1.1 (H) 07/23/2017 1606   EOSABS 0.2 10/02/2017 1245   BASOSABS 0.0 10/02/2017 1245   Iron/TIBC/Ferritin/ %Sat    Component Value Date/Time   IRON 114 07/29/2013 0825   FERRITIN 14.8 07/29/2013 0825   IRONPCTSAT 36.8 07/29/2013 0825   Lipid Panel     Component Value Date/Time   CHOL 145 03/24/2019 0730   TRIG 45 03/24/2019 0730   HDL 46 03/24/2019 0730   CHOLHDL 3 07/23/2017 1606   VLDL 9.4 07/23/2017 1606   LDLCALC 89 03/24/2019 0730   Hepatic Function Panel     Component Value Date/Time   PROT 6.9 03/24/2019 0730   ALBUMIN 3.6 (L) 03/24/2019 0730   AST 13 03/24/2019 0730   ALT 12 03/24/2019 0730   ALKPHOS 79 03/24/2019 0730   BILITOT 0.2 03/24/2019 0730   BILIDIR 0.1 05/29/2016 0938      Component Value Date/Time   TSH 1.130 10/02/2017 1245  TSH 1.10 07/23/2017 1606   TSH 1.86 05/29/2016 0938     Ref. Range 03/24/2019 07:30  Vitamin D, 25-Hydroxy Latest Ref Range: 30.0 - 100.0 ng/mL 54.5    OBESITY  BEHAVIORAL INTERVENTION VISIT  Today's visit was # 29  Starting weight: 293 lbs Starting date: 10/02/2017 Today's weight : 209 lbs Today's date: 04/14/2019 Total lbs lost to date: 84    04/14/2019  Height 5' 3"  (1.6 m)  Weight 209 lb (94.8 kg)  BMI (Calculated) 37.03  BLOOD PRESSURE - SYSTOLIC 127  BLOOD PRESSURE - DIASTOLIC 64   Body Fat % 51.7 %  Total Body Water (lbs) 90 lbs    ASK: We discussed the diagnosis of obesity with [name] today and Nerissa agreed to give [name] permission to discuss obesity behavioral modification therapy today.  ASSESS: Madalaine has the diagnosis of obesity and her BMI today is 37.03 Kizzie is in the action stage of change   ADVISE: Calina was educated on the multiple health risks of obesity as well as the benefit of weight loss to improve her health. She was advised of the need for long term treatment and the importance of lifestyle modifications to improve her current health and to decrease her risk of future health problems.  AGREE: Multiple dietary modification options and treatment options were discussed and  Kathye agreed to follow the recommendations documented in the above note.  ARRANGE: Darcelle was educated on the importance of frequent visits to treat obesity as outlined per CMS and USPSTF guidelines and agreed to schedule her next follow up appointment today.  Corey Skains, am acting as transcriptionist for Abby Potash, PA-C I, Abby Potash, PA-C have reviewed above note and agree with its content

## 2019-04-27 ENCOUNTER — Other Ambulatory Visit (INDEPENDENT_AMBULATORY_CARE_PROVIDER_SITE_OTHER): Payer: Self-pay | Admitting: Physician Assistant

## 2019-04-27 DIAGNOSIS — R7303 Prediabetes: Secondary | ICD-10-CM

## 2019-05-05 ENCOUNTER — Encounter (INDEPENDENT_AMBULATORY_CARE_PROVIDER_SITE_OTHER): Payer: Self-pay

## 2019-05-05 ENCOUNTER — Ambulatory Visit (INDEPENDENT_AMBULATORY_CARE_PROVIDER_SITE_OTHER): Payer: BC Managed Care – PPO | Admitting: Physician Assistant

## 2019-05-12 ENCOUNTER — Other Ambulatory Visit: Payer: Self-pay

## 2019-05-12 ENCOUNTER — Ambulatory Visit (INDEPENDENT_AMBULATORY_CARE_PROVIDER_SITE_OTHER): Payer: BC Managed Care – PPO | Admitting: Family Medicine

## 2019-05-12 ENCOUNTER — Encounter (INDEPENDENT_AMBULATORY_CARE_PROVIDER_SITE_OTHER): Payer: Self-pay | Admitting: Family Medicine

## 2019-05-12 VITALS — BP 97/63 | HR 80 | Temp 97.7°F | Ht 63.0 in | Wt 206.0 lb

## 2019-05-12 DIAGNOSIS — R7303 Prediabetes: Secondary | ICD-10-CM

## 2019-05-12 DIAGNOSIS — Z9189 Other specified personal risk factors, not elsewhere classified: Secondary | ICD-10-CM

## 2019-05-12 DIAGNOSIS — E559 Vitamin D deficiency, unspecified: Secondary | ICD-10-CM

## 2019-05-12 DIAGNOSIS — Z6836 Body mass index (BMI) 36.0-36.9, adult: Secondary | ICD-10-CM

## 2019-05-12 DIAGNOSIS — K5010 Crohn's disease of large intestine without complications: Secondary | ICD-10-CM | POA: Diagnosis not present

## 2019-05-18 NOTE — Progress Notes (Signed)
Office: [phone]  /  Fax: [phone]   HPI:  Chief Complaint: OBESITY [name] is here to discuss her progress with her obesity treatment plan. She is on the keep a food journal with 1400-1500 calories and 95 grams of protein daily and states she is following her eating plan approximately 85 % of the time. She states she is exercising 0 minutes 0 times per week.  [name] enjoyed a quiet Thanksgiving with her husband. She got back to the program quickly. She had a recent back strain, so she is not back to exercise.  Today's visit was # 30  Starting weight: 293 lbs Starting date: 10/02/17 Today's weight: 206 lbs Today's date: 05/12/2019 Total lbs lost to date: 87 Total lbs lost since last in-office visit: 3  Vitamin D Deficiency [name] has a diagnosis of vitamin D deficiency. She is currently taking OTC Vit D supplementation.  Pre-Diabetes [name] has a diagnosis of pre-diabetes. She is off metformin and denies polyphagia.  Lab Results  Component Value Date   HGBA1C 5.2 03/24/2019   At risk for diabetes [name] is at higher than average risk for developing diabetes due to her obesity and pre-diabetes.   Crohn's Disease [name] has a diagnosis of crohn's disease. She is immunocompromised. She is currently taking Remicade and is stable.  ASSESSMENT AND PLAN:  Vitamin D deficiency  At risk for diabetes mellitus  Prediabetes  Crohn's disease of colon without complication (HCC)  Class 2 severe obesity with serious comorbidity and body mass index (BMI) of 36.0 to 36.9 in adult, unspecified obesity type (HCC)  PLAN:  Vitamin D Deficiency Low vitamin D level contributes to fatigue and are associated with obesity, breast, and colon cancer. [name] agrees to continue taking OTC Vit D supplement and will follow up for routine testing of vitamin D, at least 2-3 times per year to avoid over-replacement. [name] agrees to follow up with our clinic in 3 weeks.  Pre-Diabetes [name]  will continue to work on weight loss, exercise, and decreasing simple carbohydrates to help decrease the risk of diabetes.   Diabetes risk counseling (~15 min) [name] is a 38 y.o. female and has risk factors for diabetes including obesity and pre-diabetes. We discussed intensive lifestyle modifications today with an emphasis on weight loss as well as increasing exercise and decreasing simple carbohydrates in her diet.  Crohn's Disease [name] will continue her medications, and we will continue to monitor.  Obesity [name] is currently in the action stage of change. As such, her goal is to continue with weight loss efforts. She has agreed to keep a food journal with 1400-1500 calories and 95 grams of protein daily [name] has been instructed to work up to a goal of 150 minutes of combined cardio and strengthening exercise per week for weight loss and overall health benefits. We discussed the following Behavioral Modification Strategies today: holiday eating strategies and planning for success.  [name] has agreed to follow up with our clinic in 3 weeks. She was informed of the importance of frequent follow up visits to maximize her success with intensive lifestyle modifications for her multiple health conditions.  ALLERGIES: No Known Allergies  MEDICATIONS: Current Outpatient Medications on File Prior to Visit  Medication Sig Dispense Refill  . Calcium Carb-Cholecalciferol (CALCIUM 600+D3 PO) Take 1 tablet by mouth daily.    . fexofenadine (ALLEGRA) 180 MG tablet Take 180 mg by mouth daily.    . InFLIXimab (REMICADE IV) Inject into the vein every 8 (eight) weeks.     [name]  Multiple Vitamin (MULTIVITAMIN PO) Take 1 tablet by mouth daily.    . Vitamin D, Cholecalciferol, 50 MCG (2000 UT) CAPS Take 4,000 Units by mouth daily.     No current facility-administered medications on file prior to visit.    PAST MEDICAL HISTORY: Past Medical History:  Diagnosis Date  . Abrasion of upper arm with  infection, left, sequela    started antibiotics lef arm oct 4th started antibiotics healing well  . Anemia    hx of 2 yrs ago  . Chicken pox   . Chronic rhinitis   . Contact dermatitis and other eczema, due to unspecified cause   . Crohn's 11-15-11   no issues in 2 yrs  . Fibroids 11-15-11   remains with fibroids  . Hernia   . Incisional hernia, RLQ 10/02/2011  . Infertility, female   . Mononucleosis   . Obesity   . Pilonidal abscess   . Rectal abscess   . 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 Vitamin D deficiency     PAST SURGICAL HISTORY: Past Surgical History:  Procedure Laterality Date  . APPENDECTOMY  7/06  . COLONOSCOPY WITH PROPOFOL N/A 03/07/2017   Procedure: COLONOSCOPY WITH PROPOFOL;  Surgeon: Milus Banister, MD;  Location: [facility];  Service: Endoscopy;  Laterality: N/A;  . EYE SURGERY  11-15-11   2003-multiple stys  . ileocecal resection  1/11   crohns disease with fisula/stricture. Dr. Ronnald Collum  . INCISIONAL HERNIA REPAIR N/A 12/24/2018   Procedure: OPEN REPAIR VENTRAL INCISIONAL HERNIA WITH MESH PATCH AND PRIMARY REPAIR OF LEFT SPIGELIAN HERNIA;  Surgeon: Armandina Gemma, MD;  Location: [facility];  Service: General;  Laterality: N/A;  . KNEE ARTHROSCOPY  99, 02   Bil.  [name] MYOMECTOMY N/A 11/10/2014   Procedure: MYOMECTOMY;  Surgeon: Everett Graff, MD;  Location: [facility];  Service: Gynecology;  Laterality: N/A;  . PILONIDAL CYST EXCISION  2011, 2012   I&Ds  . VENTRAL HERNIA REPAIR  11/27/2011   Procedure: LAPAROSCOPIC VENTRAL HERNIA;  Surgeon: Adin Hector, MD;  Location: [facility];  Service: General;  Laterality: N/A;  Laparoscopic Exploration & Repair of Hernia in Abdomen  . WISDOM TOOTH EXTRACTION      SOCIAL HISTORY: Social History   Tobacco Use  . Smoking status: Never Smoker  . Smokeless tobacco: Never Used  Substance Use Topics  . Alcohol use: No  . Drug use: No    FAMILY HISTORY: Family History  Problem Relation Age of Onset  .  Aneurysm Mother        brain  . Heart disease Mother 48       heart anyrism  . Healthy Father        fairly  . Diabetes Sister        half sister  . Bipolar disorder Sister        half sister  . Colon cancer Maternal Grandmother   . Stomach cancer Paternal Grandfather   . Multiple sclerosis Sister     ROS: Review of Systems  Constitutional: Positive for weight loss.  Gastrointestinal: Negative for nausea and vomiting.  Endo/Heme/Allergies:       Negative polyphagia    PHYSICAL EXAM: Blood pressure 97/63, pulse 80, temperature 97.7 F (36.5 C), temperature source Oral, height 5' 3"  (1.6 m), weight 206 lb (93.4 kg), SpO2 96 %. Body mass index is 36.49 kg/m.   General: Cooperative, alert, well developed, in no acute distress. HEENT: Conjunctivae and lids unremarkable. Neck:  No thyromegaly.  Cardiovascular: Regular rhythm.  Lungs: Normal work of breathing. Extremities: No edema.  Neurologic: No focal deficits.   RECENT LABS AND TESTS: BMET    Component Value Date/Time   NA 138 03/24/2019 0730   K 4.2 03/24/2019 0730   CL 104 03/24/2019 0730   CO2 23 03/24/2019 0730   GLUCOSE 80 03/24/2019 0730   GLUCOSE 77 07/23/2017 1606   BUN 14 03/24/2019 0730   CREATININE 0.64 03/24/2019 0730   CALCIUM 8.9 03/24/2019 0730   GFRNONAA 116 03/24/2019 0730   GFRAA 134 03/24/2019 0730   Lab Results  Component Value Date   HGBA1C 5.2 03/24/2019   HGBA1C 5.3 12/17/2018   HGBA1C 5.5 08/26/2018   HGBA1C 5.7 (H) 05/08/2018   HGBA1C 5.6 01/06/2018   Lab Results  Component Value Date   INSULIN 4.2 03/24/2019   INSULIN 5.6 12/17/2018   INSULIN 14.2 08/26/2018   INSULIN 3.5 05/08/2018   INSULIN 10.1 01/06/2018   CBC    Component Value Date/Time   WBC 11.8 (H) 12/23/2018 0843   RBC 4.89 12/23/2018 0843   HGB 13.8 12/23/2018 0843   HGB 11.2 10/02/2017 1245   HCT 44.9 12/23/2018 0843   HCT 36.9 10/02/2017 1245   PLT 363 12/23/2018 0843   MCV 91.8 12/23/2018 0843   MCV 82  10/02/2017 1245   MCH 28.2 12/23/2018 0843   MCHC 30.7 12/23/2018 0843   RDW 14.4 12/23/2018 0843   RDW 17.4 (H) 10/02/2017 1245   LYMPHSABS 3.9 (H) 10/02/2017 1245   MONOABS 1.1 (H) 07/23/2017 1606   EOSABS 0.2 10/02/2017 1245   BASOSABS 0.0 10/02/2017 1245   Iron/TIBC/Ferritin/ %Sat    Component Value Date/Time   IRON 114 07/29/2013 0825   FERRITIN 14.8 07/29/2013 0825   IRONPCTSAT 36.8 07/29/2013 0825   Lipid Panel     Component Value Date/Time   CHOL 145 03/24/2019 0730   TRIG 45 03/24/2019 0730   HDL 46 03/24/2019 0730   CHOLHDL 3 07/23/2017 1606   VLDL 9.4 07/23/2017 1606   LDLCALC 89 03/24/2019 0730   Hepatic Function Panel     Component Value Date/Time   PROT 6.9 03/24/2019 0730   ALBUMIN 3.6 (L) 03/24/2019 0730   AST 13 03/24/2019 0730   ALT 12 03/24/2019 0730   ALKPHOS 79 03/24/2019 0730   BILITOT 0.2 03/24/2019 0730   BILIDIR 0.1 05/29/2016 0938      Component Value Date/Time   TSH 1.130 10/02/2017 1245   TSH 1.10 07/23/2017 1606   TSH 1.86 05/29/2016 0938   I, Trixie Dredge, am acting as transcriptionist for Briscoe Deutscher, DO  I have reviewed the above documentation for accuracy and completeness, and I agree with the above. Briscoe Deutscher, DO

## 2019-05-21 ENCOUNTER — Encounter (INDEPENDENT_AMBULATORY_CARE_PROVIDER_SITE_OTHER): Payer: Self-pay | Admitting: Family Medicine

## 2019-06-02 ENCOUNTER — Ambulatory Visit (INDEPENDENT_AMBULATORY_CARE_PROVIDER_SITE_OTHER): Payer: BC Managed Care – PPO | Admitting: Physician Assistant

## 2019-06-02 ENCOUNTER — Encounter (INDEPENDENT_AMBULATORY_CARE_PROVIDER_SITE_OTHER): Payer: Self-pay | Admitting: Physician Assistant

## 2019-06-02 ENCOUNTER — Other Ambulatory Visit: Payer: Self-pay

## 2019-06-02 VITALS — BP 109/69 | HR 89 | Temp 97.5°F | Ht 63.0 in | Wt 209.0 lb

## 2019-06-02 DIAGNOSIS — Z6837 Body mass index (BMI) 37.0-37.9, adult: Secondary | ICD-10-CM | POA: Diagnosis not present

## 2019-06-02 DIAGNOSIS — R7303 Prediabetes: Secondary | ICD-10-CM

## 2019-06-02 DIAGNOSIS — E559 Vitamin D deficiency, unspecified: Secondary | ICD-10-CM | POA: Diagnosis not present

## 2019-06-03 NOTE — Progress Notes (Signed)
Office: [phone]  /  Fax: [phone]   HPI:  Chief Complaint: OBESITY [name] is here to discuss her progress with her obesity treatment plan. She is on the Category 3 Plan and states she is following her eating plan approximately 80 % of the time. She states she is doing strength exercise and aerobics 30 to 45 minutes 7 times per week. [name] states that she was slightly off track during the holidays, indulging in foods that she does not normally eat. She stopped working out for a short period after tweaking her back, but she has resumed exercising last week.  Vitamin D deficiency [name] has a diagnosis of vitamin D deficiency. She is on OTC vitamin D. [name] has no nausea, vomiting or muscle weakness. She is not exercising outside.  Prediabetes [name] has a diagnosis of prediabetes. She is not on medications. [name] has no polyphagia. She reports hunger to be well controlled.  Today's visit was # 30  Starting weight: 293 lbs Starting date: 10/02/2017 Today's weight : 209 lbs Today's date: 06/02/2019 Total lbs lost to date: 45 Total lbs lost since last in-office visit: 0  ASSESSMENT AND PLAN:  Vitamin D deficiency  Prediabetes  Class 2 severe obesity with serious comorbidity and body mass index (BMI) of 37.0 to 37.9 in adult, unspecified obesity type ([place])  PLAN:  Vitamin D deficiency Low Vitamin D level contributes to fatigue and are associated with obesity, breast, and colon cancer. [name] will continue to take OTC Vitamin D @2 ,000 IU daily and she will follow-up for routine testing of vitamin D, at least 2-3 times per year to avoid over-replacement.  Prediabetes [name] will continue with the plan and exercise. She will continue to work on weight loss, exercise, and decreasing simple carbohydrates to help decrease the risk of diabetes.   Obesity [name] is currently in the action stage of change. As such, her goal is to continue with weight loss efforts. She has agreed  to keeping a food journal and adhering to recommended goals of 1400 to 1500 calories and 95 grams of protein daily. [name] has been instructed to work up to a goal of 150 minutes of combined cardio and strengthening exercise per week for weight loss and overall health benefits. We discussed the following Behavioral Modification Strategies today: meal planning and cooking strategies and keeping healthy foods in the home.  [name] has agreed to follow-up with our clinic in 3 weeks. She was informed of the importance of frequent follow-up visits to maximize her success with intensive lifestyle modifications for her multiple health conditions.  ALLERGIES: No Known Allergies  MEDICATIONS: Current Outpatient Medications on File Prior to Visit  Medication Sig Dispense Refill  . Calcium Carb-Cholecalciferol (CALCIUM 600+D3 PO) Take 1 tablet by mouth daily.    . fexofenadine (ALLEGRA) 180 MG tablet Take 180 mg by mouth daily.    . InFLIXimab (REMICADE IV) Inject into the vein every 8 (eight) weeks.     . Multiple Vitamin (MULTIVITAMIN PO) Take 1 tablet by mouth daily.    . Vitamin D, Cholecalciferol, 50 MCG (2000 UT) CAPS Take 4,000 Units by mouth daily.     No current facility-administered medications on file prior to visit.    PAST MEDICAL HISTORY: Past Medical History:  Diagnosis Date  . Abrasion of upper arm with infection, left, sequela    started antibiotics lef arm oct 4th started antibiotics healing well  . Anemia    hx of 2 yrs ago  . Chicken pox   .  Chronic rhinitis   . Contact dermatitis and other eczema, due to unspecified cause   . Crohn's 11-15-11   no issues in 2 yrs  . Fibroids 11-15-11   remains with fibroids  . Hernia   . Incisional hernia, RLQ 10/02/2011  . Infertility, female   . Mononucleosis   . Obesity   . Pilonidal abscess   . Rectal abscess   . 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 Vitamin D deficiency     PAST SURGICAL HISTORY: Past Surgical  History:  Procedure Laterality Date  . APPENDECTOMY  7/06  . COLONOSCOPY WITH PROPOFOL N/A 03/07/2017   Procedure: COLONOSCOPY WITH PROPOFOL;  Surgeon: Milus Banister, MD;  [facility];  Service: Endoscopy;  Laterality: N/A;  . EYE SURGERY  11-15-11   2003-multiple stys  . ileocecal resection  1/11   crohns disease with fisula/stricture. Dr. Ronnald Collum  . INCISIONAL HERNIA REPAIR N/A 12/24/2018   Procedure: OPEN REPAIR VENTRAL INCISIONAL HERNIA WITH MESH PATCH AND PRIMARY REPAIR OF LEFT SPIGELIAN HERNIA;  Surgeon: Armandina Gemma, MD;  Location: [facility];  Service: General;  Laterality: N/A;  . KNEE ARTHROSCOPY  99, 02   Bil.  [name] MYOMECTOMY N/A 11/10/2014   Procedure: MYOMECTOMY;  Surgeon: Everett Graff, MD;  Location: [facility];  Service: Gynecology;  Laterality: N/A;  . PILONIDAL CYST EXCISION  2011, 2012   I&Ds  . VENTRAL HERNIA REPAIR  11/27/2011   Procedure: LAPAROSCOPIC VENTRAL HERNIA;  Surgeon: Adin Hector, MD;  Location: [facility];  Service: General;  Laterality: N/A;  Laparoscopic Exploration & Repair of Hernia in Abdomen  . WISDOM TOOTH EXTRACTION      SOCIAL HISTORY: Social History   Tobacco Use  . Smoking status: Never Smoker  . Smokeless tobacco: Never Used  Substance Use Topics  . Alcohol use: No  . Drug use: No    FAMILY HISTORY: Family History  Problem Relation Age of Onset  . Aneurysm Mother        brain  . Heart disease Mother 33       heart anyrism  . Healthy Father        fairly  . Diabetes Sister        half sister  . Bipolar disorder Sister        half sister  . Colon cancer Maternal Grandmother   . Stomach cancer Paternal Grandfather   . Multiple sclerosis Sister     ROS: Review of Systems  Constitutional: Negative for weight loss.  Gastrointestinal: Negative for nausea and vomiting.  Musculoskeletal:       Negative for muscle weakness  Endo/Heme/Allergies:       Negative for polyphagia    PHYSICAL EXAM: Blood pressure 109/69,  pulse 89, temperature (!) 97.5 F (36.4 C), temperature source Oral, height 5' 3"  (1.6 m), weight 209 lb (94.8 kg), SpO2 96 %. Body mass index is 37.02 kg/m. Physical Exam Vitals reviewed.  Constitutional:      General: She is not in acute distress.    Appearance: Normal appearance. She is well-developed. She is obese.  Pulmonary:     Effort: Pulmonary effort is normal.  Neurological:     Mental Status: She is alert and oriented to person, place, and time.  Psychiatric:        Mood and Affect: Mood normal.        Behavior: Behavior normal.     RECENT LABS AND TESTS: BMET    Component Value  Date/Time   NA 138 03/24/2019 0730   K 4.2 03/24/2019 0730   CL 104 03/24/2019 0730   CO2 23 03/24/2019 0730   GLUCOSE 80 03/24/2019 0730   GLUCOSE 77 07/23/2017 1606   BUN 14 03/24/2019 0730   CREATININE 0.64 03/24/2019 0730   CALCIUM 8.9 03/24/2019 0730   GFRNONAA 116 03/24/2019 0730   GFRAA 134 03/24/2019 0730   Lab Results  Component Value Date   HGBA1C 5.2 03/24/2019   HGBA1C 5.3 12/17/2018   HGBA1C 5.5 08/26/2018   HGBA1C 5.7 (H) 05/08/2018   HGBA1C 5.6 01/06/2018   Lab Results  Component Value Date   INSULIN 4.2 03/24/2019   INSULIN 5.6 12/17/2018   INSULIN 14.2 08/26/2018   INSULIN 3.5 05/08/2018   INSULIN 10.1 01/06/2018   CBC    Component Value Date/Time   WBC 11.8 (H) 12/23/2018 0843   RBC 4.89 12/23/2018 0843   HGB 13.8 12/23/2018 0843   HGB 11.2 10/02/2017 1245   HCT 44.9 12/23/2018 0843   HCT 36.9 10/02/2017 1245   PLT 363 12/23/2018 0843   MCV 91.8 12/23/2018 0843   MCV 82 10/02/2017 1245   MCH 28.2 12/23/2018 0843   MCHC 30.7 12/23/2018 0843   RDW 14.4 12/23/2018 0843   RDW 17.4 (H) 10/02/2017 1245   LYMPHSABS 3.9 (H) 10/02/2017 1245   MONOABS 1.1 (H) 07/23/2017 1606   EOSABS 0.2 10/02/2017 1245   BASOSABS 0.0 10/02/2017 1245   Iron/TIBC/Ferritin/ %Sat    Component Value Date/Time   IRON 114 07/29/2013 0825   FERRITIN 14.8 07/29/2013 0825     IRONPCTSAT 36.8 07/29/2013 0825   Lipid Panel     Component Value Date/Time   CHOL 145 03/24/2019 0730   TRIG 45 03/24/2019 0730   HDL 46 03/24/2019 0730   CHOLHDL 3 07/23/2017 1606   VLDL 9.4 07/23/2017 1606   LDLCALC 89 03/24/2019 0730   Hepatic Function Panel     Component Value Date/Time   PROT 6.9 03/24/2019 0730   ALBUMIN 3.6 (L) 03/24/2019 0730   AST 13 03/24/2019 0730   ALT 12 03/24/2019 0730   ALKPHOS 79 03/24/2019 0730   BILITOT 0.2 03/24/2019 0730   BILIDIR 0.1 05/29/2016 0938      Component Value Date/Time   TSH 1.130 10/02/2017 1245   TSH 1.10 07/23/2017 1606   TSH 1.86 05/29/2016 0938    Ref. Range 03/24/2019 07:30  Vitamin D, 25-Hydroxy Latest Ref Range: 30.0 - 100.0 ng/mL 54.5    I, [name], am acting as Location manager for [employer], PA-C. IAbby Potash, PA-C have reviewed above note and agree with its content

## 2019-06-15 NOTE — Telephone Encounter (Signed)
Dr Ardis Hughs please see the message from the pt regarding working from home.

## 2019-06-18 ENCOUNTER — Telehealth: Payer: Self-pay | Admitting: Gastroenterology

## 2019-06-18 NOTE — Telephone Encounter (Signed)
I have messaged the pt via My Chart.

## 2019-06-18 NOTE — Telephone Encounter (Signed)
Pt would like to speak with you about the letter that Dr. Ardis Hughs wrote for her. Pls call her.

## 2019-06-23 ENCOUNTER — Ambulatory Visit (INDEPENDENT_AMBULATORY_CARE_PROVIDER_SITE_OTHER): Payer: BC Managed Care – PPO | Admitting: Physician Assistant

## 2019-06-23 ENCOUNTER — Other Ambulatory Visit: Payer: Self-pay

## 2019-06-23 ENCOUNTER — Encounter (INDEPENDENT_AMBULATORY_CARE_PROVIDER_SITE_OTHER): Payer: Self-pay | Admitting: Physician Assistant

## 2019-06-23 VITALS — BP 97/66 | HR 76 | Temp 98.1°F | Ht 63.0 in | Wt 207.0 lb

## 2019-06-23 DIAGNOSIS — R7303 Prediabetes: Secondary | ICD-10-CM

## 2019-06-23 DIAGNOSIS — Z6836 Body mass index (BMI) 36.0-36.9, adult: Secondary | ICD-10-CM | POA: Diagnosis not present

## 2019-06-23 NOTE — Progress Notes (Signed)
Chief Complaint:   OBESITY [name] is here to discuss her progress with her obesity treatment plan along with follow-up of her obesity related diagnoses. [name] is keeping a food journal and adhering to recommended goals of 1400-1500 calories and 95 grams of protein and states she is following her eating plan approximately 85% of the time. [name] states she is exercising 0 minutes 0 times per week.  Today's visit was #: 86 Starting weight: 293 lbs Starting date: 10/02/2017 Today's weight: 207 lbs Today's date: 06/23/2019 Total lbs lost to date: 86  Total lbs lost since last in-office visit: 2  Interim History: [name] reports that she is staying between 1400-1500 calories most days and getting at least 100 grams of protein. She was not working out the last few weeks until a couple of days ago and plans to continue.   Subjective:   Prediabetes. [name] has a diagnosis of prediabetes based on her elevated HgA1c and was informed this puts her at greater risk of developing diabetes. She continues to work on diet and exercise to decrease her risk of diabetes. She denies nausea or hypoglycemia. [name] is on no medications. No polyphagia.  Lab Results  Component Value Date   HGBA1C 5.2 03/24/2019   Lab Results  Component Value Date   INSULIN 4.2 03/24/2019   INSULIN 5.6 12/17/2018   INSULIN 14.2 08/26/2018   INSULIN 3.5 05/08/2018   INSULIN 10.1 01/06/2018   Assessment/Plan:   Prediabetes. [name] will continue to work on weight loss, exercise, and decreasing simple carbohydrates to help decrease the risk of diabetes. Will check labs at her next visit.  Class 2 severe obesity with serious comorbidity and body mass index (BMI) of 36.0 to 36.9 in adult, unspecified obesity type ([place]).  [name] is currently in the action stage of change. As such, her goal is to continue with weight loss efforts. She has agreed to keeping a food journal and adhering to recommended goals of  1400-1500 calories and 95 grams of protein daily.  Exercise goals: For substantial health benefits, adults should do at least 150 minutes (2 hours and 30 minutes) a week of moderate-intensity, or 75 minutes (1 hour and 15 minutes) a week of vigorous-intensity aerobic physical activity, or an equivalent combination of moderate- and vigorous-intensity aerobic activity. Aerobic activity should be performed in episodes of at least 10 minutes, and preferably, it should be spread throughout the week. Adults should also include muscle-strengthening activities that involve all major muscle groups on 2 or more days a week.  Behavioral modification strategies: meal planning and cooking strategies and keeping healthy foods in the home.  [name] has agreed to follow-up with our clinic in 2 weeks. She was informed of the importance of frequent follow-up visits to maximize her success with intensive lifestyle modifications for her multiple health conditions.   Objective:   Blood pressure 97/66, pulse 76, temperature 98.1 F (36.7 C), temperature source Oral, height 5' 3"  (1.6 m), weight 207 lb (93.9 kg), SpO2 99 %. Body mass index is 36.67 kg/m.  General: Cooperative, alert, well developed, in no acute distress. HEENT: Conjunctivae and lids unremarkable. Cardiovascular: Regular rhythm.  Lungs: Normal work of breathing. Neurologic: No focal deficits.   Lab Results  Component Value Date   CREATININE 0.64 03/24/2019   BUN 14 03/24/2019   NA 138 03/24/2019   K 4.2 03/24/2019   CL 104 03/24/2019   CO2 23 03/24/2019   Lab Results  Component Value Date  ALT 12 03/24/2019   AST 13 03/24/2019   ALKPHOS 79 03/24/2019   BILITOT 0.2 03/24/2019   Lab Results  Component Value Date   HGBA1C 5.2 03/24/2019   HGBA1C 5.3 12/17/2018   HGBA1C 5.5 08/26/2018   HGBA1C 5.7 (H) 05/08/2018   HGBA1C 5.6 01/06/2018   Lab Results  Component Value Date   INSULIN 4.2 03/24/2019   INSULIN 5.6 12/17/2018    INSULIN 14.2 08/26/2018   INSULIN 3.5 05/08/2018   INSULIN 10.1 01/06/2018   Lab Results  Component Value Date   TSH 1.130 10/02/2017   Lab Results  Component Value Date   CHOL 145 03/24/2019   HDL 46 03/24/2019   LDLCALC 89 03/24/2019   TRIG 45 03/24/2019   CHOLHDL 3 07/23/2017   Lab Results  Component Value Date   WBC 11.8 (H) 12/23/2018   HGB 13.8 12/23/2018   HCT 44.9 12/23/2018   MCV 91.8 12/23/2018   PLT 363 12/23/2018   Lab Results  Component Value Date   IRON 114 07/29/2013   FERRITIN 14.8 07/29/2013   Attestation Statements:   Reviewed by clinician on day of visit: allergies, medications, problem list, medical history, surgical history, family history, social history, and previous encounter notes.  Time spent on visit including pre-visit chart review and post-visit care was 20 minutes.   IMichaelene Song, am acting as transcriptionist for Abby Potash, PA-C   I have reviewed the above documentation for accuracy and completeness, and I agree with the above. Abby Potash, PA-C

## 2019-07-06 IMAGING — CT CT ABDOMEN AND PELVIS WITH CONTRAST
1 of 2 series · 12 of 32 positions shown, 17 images · IV contrast (iopamidol)
Comparison: CT the pelvis 05/31/2009. CT the abdomen and pelvis
05/26/2009.

CLINICAL DATA: 37-year-old female with history of Crohn's disease
status post bowel resection and hernia repair in 5007. Evaluate for
abdominal hernia.

EXAM:
CT ABDOMEN AND PELVIS WITH CONTRAST
TECHNIQUE: Multidetector CT imaging of the abdomen and pelvis was performed
using the standard protocol following bolus administration of
intravenous contrast.
CONTRAST:  125mL 06RWC6-111 IOPAMIDOL ([id number]) INJECTION 61%

[Series 2: abd/pelvis w/cm · axial · 0.98mm/px · z∈[-494,-54]mm · 12 of 100 slices shown, 17 images]
[im 6/100  soft-tissue]
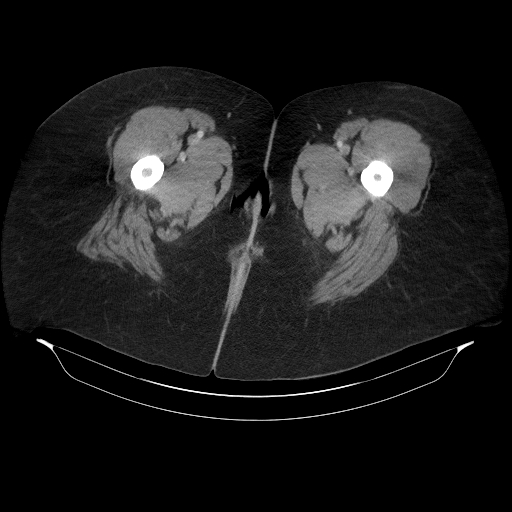
[im 6/100  bone]
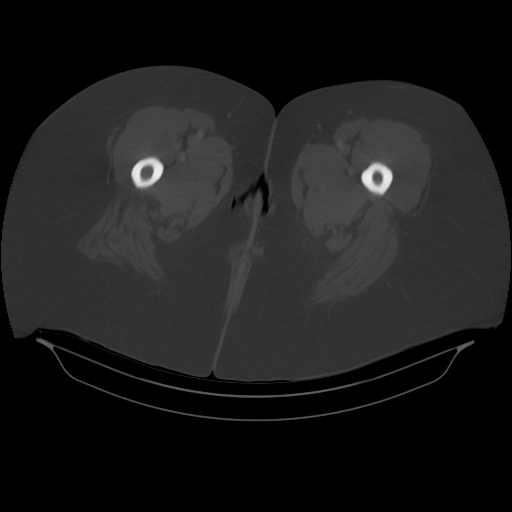
[im 18/100  soft-tissue]
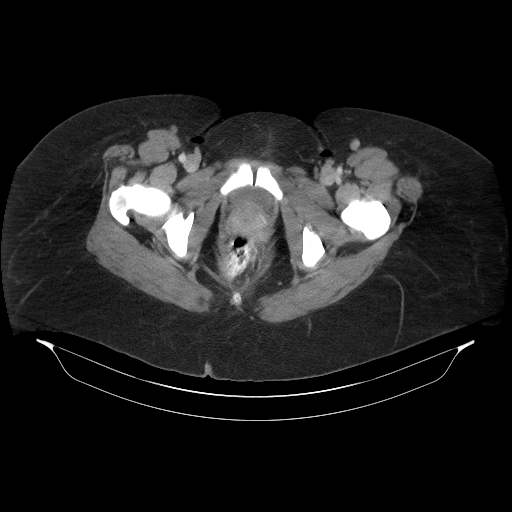
[im 24/100  soft-tissue]
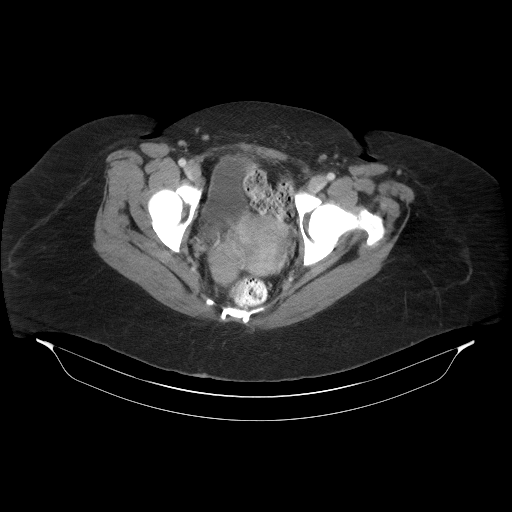
[im 35/100  soft-tissue]
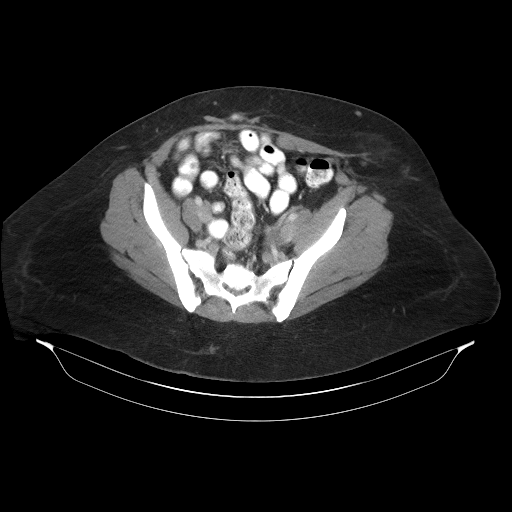
[im 41/100  soft-tissue]
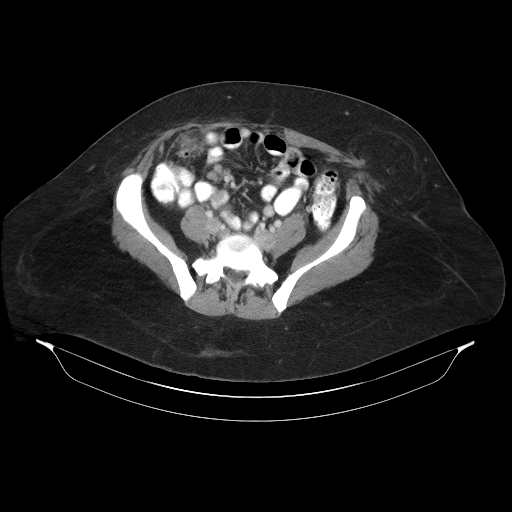
[im 53/100  soft-tissue]
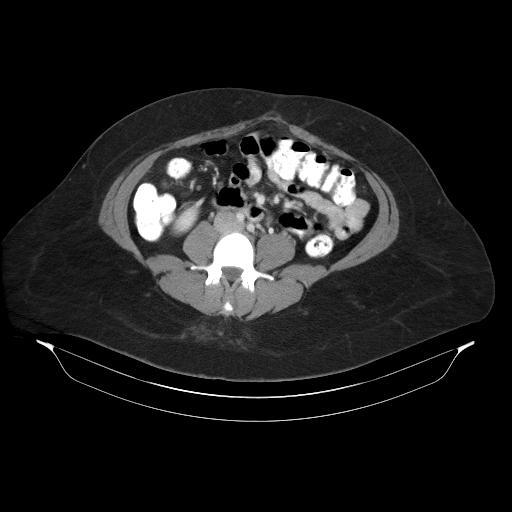
[im 59/100  soft-tissue]
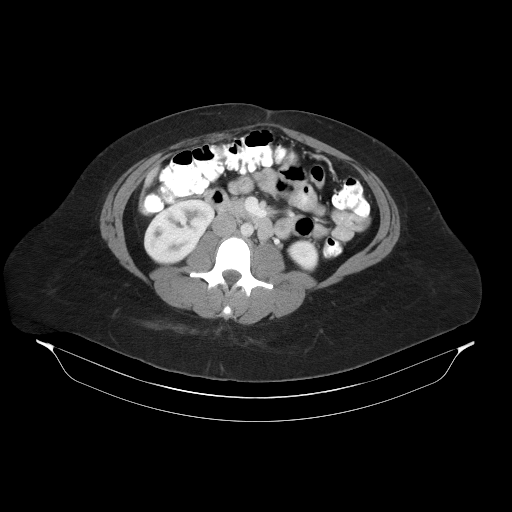
[im 65/100  soft-tissue]
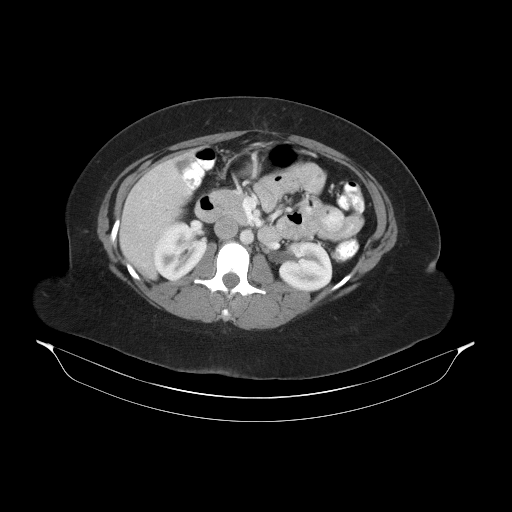
[im 76/100  soft-tissue]
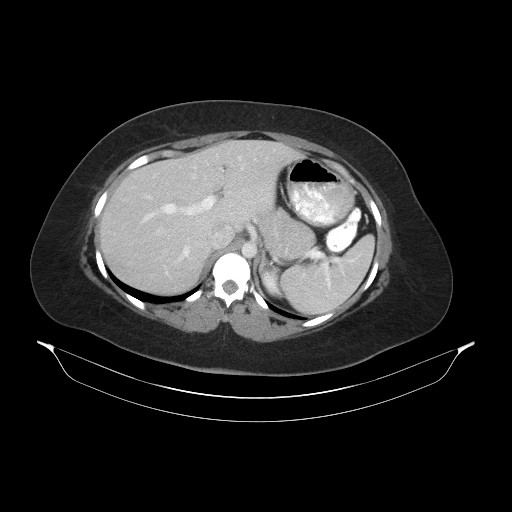
[im 76/100  lung]
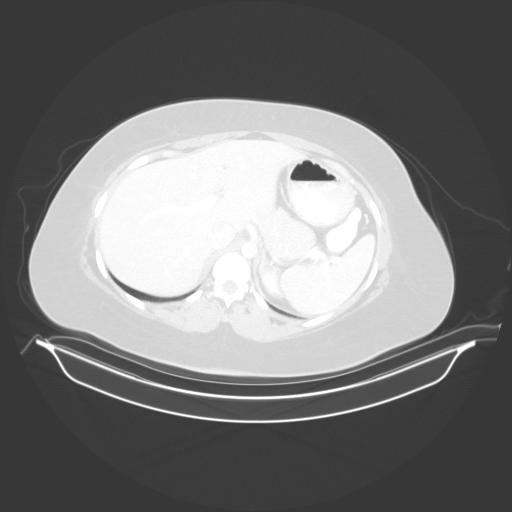
[im 76/100  bone]
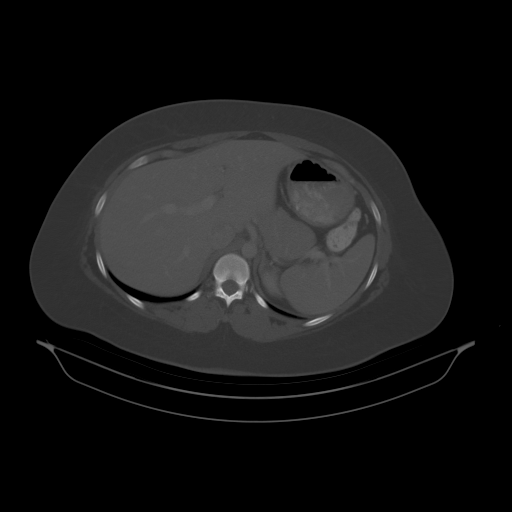
[im 82/100  soft-tissue]
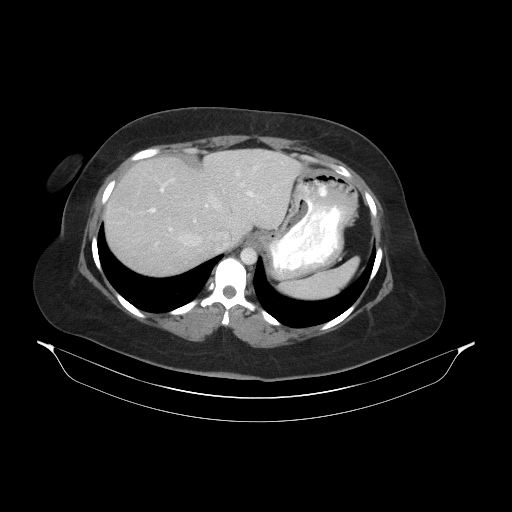
[im 82/100  lung]
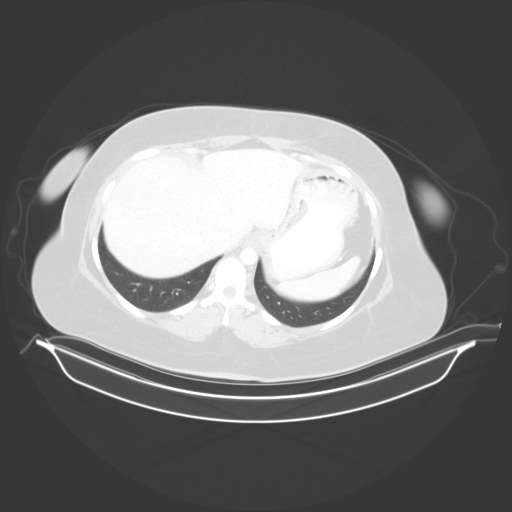
[im 88/100  lung]
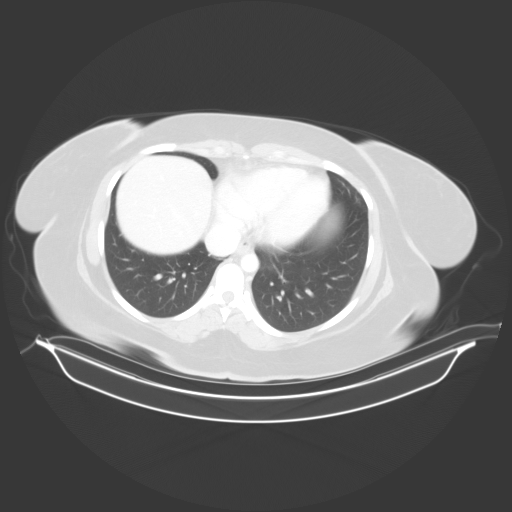
[im 94/100  soft-tissue]
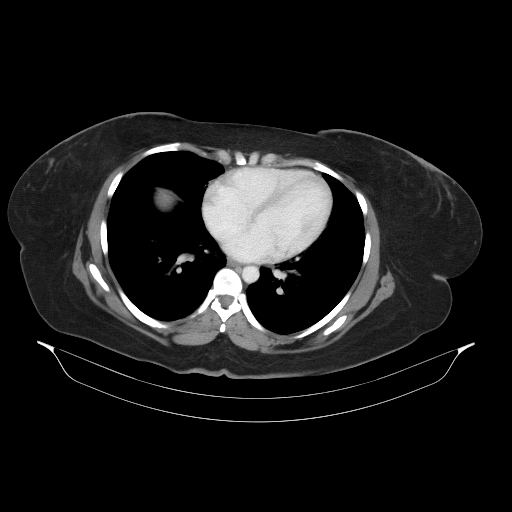
[im 94/100  lung]
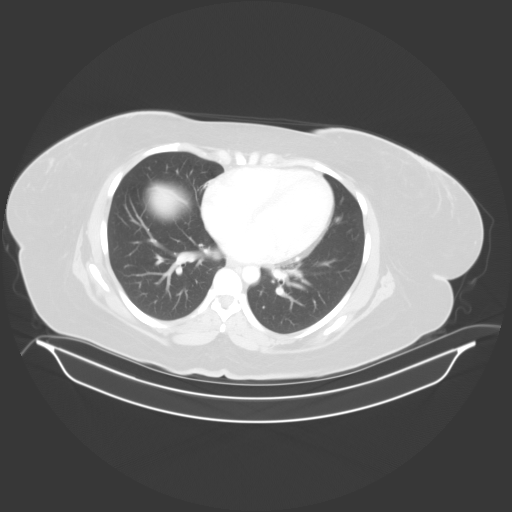

[12 of 32 positions shown; findings below may reference images not displayed]

FINDINGS: Lower chest: Unremarkable.

Hepatobiliary: No cystic or solid hepatic lesions. No intra or
extrahepatic biliary ductal dilatation. Gallbladder is normal in
appearance.

Pancreas: No pancreatic mass. No pancreatic ductal dilatation. No
pancreatic or peripancreatic fluid or inflammatory changes.

Spleen: Unremarkable.

Adrenals/Urinary Tract: Bilateral kidneys and adrenal glands are
normal in appearance. No hydroureteronephrosis. Urinary bladder is
normal in appearance.

Stomach/Bowel: Normal appearance of the stomach. No pathologic
dilatation of small bowel or colon. In the region of the cecum there
is some focal inflammation anteriorly where there is a focal
outpouching, best appreciated on axial images 57-60 of series 2,
presumably a site of active disease. Patient appears to be status
post appendectomy. There is what appears to be a neo terminal ileum
which appears widely patent, and does not appear inflamed (best
appreciated on coronal image 43 of series 3). No other definite
sites of mural thickening or surrounding inflammatory changes are
noted throughout the stomach, small bowel or elsewhere in the colon.

Vascular/Lymphatic: No significant atherosclerotic disease in the
abdominal aorta or pelvic vasculature. No lymphadenopathy noted in
the abdomen or pelvis.

Reproductive: Uterus is enlarged and heterogeneous in appearance
with multiple heterogeneously enhancing lesions, largest of which is
exophytic in the right-side of the uterine body measuring
approximately 5.0 x 4.0 x 4.0 cm (axial image 75 of series 2 and
coronal image 63 of series 3). Ovaries are unremarkable in
appearance.

Other: In the mid pelvis anteriorly slightly to the right of midline
(axial image 63 of series 2) there is a very small ventral hernia
which contains a very short segment of mid small bowel. In the lower
left abdominal wall (axial image 61 of series 2) there is fascial
discontinuity between the left rectus abdominus and adjacent oblique
musculature, through which extensive herniated fat is noted,
compatible with a Spigelian hernia. No significant volume of
ascites. No pneumoperitoneum.

Musculoskeletal: There are no aggressive appearing lytic or blastic
lesions noted in the visualized portions of the skeleton.
IMPRESSION: 1. Large low left Spigelian hernia containing only fat.
2. Small low ventral hernia slightly to the right of midline
(potentially an incisional hernia) containing a short segment of mid
small bowel, without evidence of bowel incarceration or obstruction
at this time.
3. In the anterior aspect of the cecum there is a focus of mural
thickening with surrounding inflammatory changes, favored to reflect
an area of active disease given the patient's history of Crohn's
disease. No other sites of active disease are noted on today's
examination.
4. Fibroid uterus.
5. Additional incidental findings, as above.

## 2019-07-14 ENCOUNTER — Ambulatory Visit (INDEPENDENT_AMBULATORY_CARE_PROVIDER_SITE_OTHER): Payer: BC Managed Care – PPO | Admitting: Physician Assistant

## 2019-07-14 ENCOUNTER — Encounter (INDEPENDENT_AMBULATORY_CARE_PROVIDER_SITE_OTHER): Payer: Self-pay | Admitting: Physician Assistant

## 2019-07-14 ENCOUNTER — Other Ambulatory Visit: Payer: Self-pay

## 2019-07-14 VITALS — BP 111/71 | HR 84 | Temp 98.0°F | Ht 63.0 in | Wt 205.0 lb

## 2019-07-14 DIAGNOSIS — E559 Vitamin D deficiency, unspecified: Secondary | ICD-10-CM | POA: Diagnosis not present

## 2019-07-14 DIAGNOSIS — Z9189 Other specified personal risk factors, not elsewhere classified: Secondary | ICD-10-CM | POA: Diagnosis not present

## 2019-07-14 DIAGNOSIS — R7303 Prediabetes: Secondary | ICD-10-CM | POA: Diagnosis not present

## 2019-07-14 DIAGNOSIS — E7849 Other hyperlipidemia: Secondary | ICD-10-CM | POA: Diagnosis not present

## 2019-07-14 DIAGNOSIS — Z6836 Body mass index (BMI) 36.0-36.9, adult: Secondary | ICD-10-CM

## 2019-07-14 NOTE — Progress Notes (Signed)
Chief Complaint:   OBESITY [name] is here to discuss her progress with her obesity treatment plan along with follow-up of her obesity related diagnoses. [name] is on the Category 3 Plan/journaling and states she is following her eating plan approximately 90% of the time. [name] states she is exercising 0 minutes 0 times per week.  Today's visit was #: 50 Starting weight: 293 lbs Starting date: 10/02/2017 Today's weight: 205 lbs Today's date: 07/14/2019 Total lbs lost to date: 88 Total lbs lost since last in-office visit: 2  Interim History: [name] reports that she was working out regularly for the first 2 weeks, then injured her back. Since then she has been doing Walking at [employer]. She is doing a great job with journaling.  Subjective:   Prediabetes. [name] has a diagnosis of prediabetes based on her elevated HgA1c and was informed this puts her at greater risk of developing diabetes. She continues to work on diet and exercise to decrease her risk of diabetes. [name] is on no medications. No polyphagia. She is due for labs.  Lab Results  Component Value Date   HGBA1C 5.2 03/24/2019   Lab Results  Component Value Date   INSULIN 4.2 03/24/2019   INSULIN 5.6 12/17/2018   INSULIN 14.2 08/26/2018   INSULIN 3.5 05/08/2018   INSULIN 10.1 01/06/2018   Vitamin D deficiency. [name] is on OTC Vitamin D 4,000 units daily. Last Vitamin D level 54.5 on 03/24/2019. She is due for labs.   Other hyperlipidemia. [name] has hyperlipidemia and has been trying to improve her cholesterol levels with intensive lifestyle modification including a low saturated fat diet, exercise and weight loss. She denies any chest pain. [name] is on no medications. She is due for labs.  Lab Results  Component Value Date   ALT 12 03/24/2019   AST 13 03/24/2019   ALKPHOS 79 03/24/2019   BILITOT 0.2 03/24/2019   Lab Results  Component Value Date   CHOL 145 03/24/2019   HDL 46 03/24/2019   LDLCALC 89 03/24/2019   TRIG 45 03/24/2019   CHOLHDL 3 07/23/2017   At risk for diabetes mellitus. [name] is at higher than average risk for developing diabetes due to her obesity.   Assessment/Plan:   Prediabetes. [name] will continue to work on weight loss, exercise, and decreasing simple carbohydrates to help decrease the risk of diabetes. Comprehensive metabolic panel, Hemoglobin A1c, Insulin, random labs ordered.  Vitamin D deficiency. Low Vitamin D level contributes to fatigue and are associated with obesity, breast, and colon cancer. She agrees to continue to take Vitamin D and VITAMIN D 25 Hydroxy (Vit-D Deficiency, Fractures) level was ordered.  Other hyperlipidemia. Cardiovascular risk and specific lipid/LDL goals reviewed.  We discussed several lifestyle modifications today and [name] will continue to work on diet, exercise and weight loss efforts. Orders and follow up as documented in patient record. Lipid Panel With LDL/HDL Ratio labs ordered.  Counseling Intensive lifestyle modifications are the first line treatment for this issue. . Dietary changes: Increase soluble fiber. Decrease simple carbohydrates. . Exercise changes: Moderate to vigorous-intensity aerobic activity 150 minutes per week if tolerated. . Lipid-lowering medications: see documented in medical record.      At risk for diabetes mellitus. [name] was given approximately 15 minutes of diabetes education and counseling today. We discussed intensive lifestyle modifications today with an emphasis on weight loss as well as increasing exercise and decreasing simple carbohydrates in her diet. We also reviewed medication options with  an emphasis on risk versus benefit of those discussed.   Repetitive spaced learning was employed today to elicit superior memory formation and behavioral change.  Class 2 severe obesity with serious comorbidity and body mass index (BMI) of 36.0 to 36.9 in adult, unspecified obesity type  ([name]).  [name] is currently in the action stage of change. As such, her goal is to continue with weight loss efforts. She has agreed to the Category 3 Plan.   Exercise goals: For substantial health benefits, adults should do at least 150 minutes (2 hours and 30 minutes) a week of moderate-intensity, or 75 minutes (1 hour and 15 minutes) a week of vigorous-intensity aerobic physical activity, or an equivalent combination of moderate- and vigorous-intensity aerobic activity. Aerobic activity should be performed in episodes of at least 10 minutes, and preferably, it should be spread throughout the week.  Behavioral modification strategies: meal planning and cooking strategies and planning for success.  [name] has agreed to follow-up with our clinic in 3 weeks. She was informed of the importance of frequent follow-up visits to maximize her success with intensive lifestyle modifications for her multiple health conditions.   [name] was informed we would discuss her lab results at her next visit unless there is a critical issue that needs to be addressed sooner. [name] agreed to keep her next visit at the agreed upon time to discuss these results.  Objective:   Blood pressure 111/71, pulse 84, temperature 98 F (36.7 C), temperature source Oral, height 5' 3"  (1.6 m), weight 205 lb (93 kg), SpO2 98 %. Body mass index is 36.31 kg/m.  General: Cooperative, alert, well developed, in no acute distress. HEENT: Conjunctivae and lids unremarkable. Cardiovascular: Regular rhythm.  Lungs: Normal work of breathing. Neurologic: No focal deficits.   Lab Results  Component Value Date   CREATININE 0.64 03/24/2019   BUN 14 03/24/2019   NA 138 03/24/2019   K 4.2 03/24/2019   CL 104 03/24/2019   CO2 23 03/24/2019   Lab Results  Component Value Date   ALT 12 03/24/2019   AST 13 03/24/2019   ALKPHOS 79 03/24/2019   BILITOT 0.2 03/24/2019   Lab Results  Component Value Date   HGBA1C 5.2 03/24/2019    HGBA1C 5.3 12/17/2018   HGBA1C 5.5 08/26/2018   HGBA1C 5.7 (H) 05/08/2018   HGBA1C 5.6 01/06/2018   Lab Results  Component Value Date   INSULIN 4.2 03/24/2019   INSULIN 5.6 12/17/2018   INSULIN 14.2 08/26/2018   INSULIN 3.5 05/08/2018   INSULIN 10.1 01/06/2018   Lab Results  Component Value Date   TSH 1.130 10/02/2017   Lab Results  Component Value Date   CHOL 145 03/24/2019   HDL 46 03/24/2019   LDLCALC 89 03/24/2019   TRIG 45 03/24/2019   CHOLHDL 3 07/23/2017   Lab Results  Component Value Date   WBC 11.8 (H) 12/23/2018   HGB 13.8 12/23/2018   HCT 44.9 12/23/2018   MCV 91.8 12/23/2018   PLT 363 12/23/2018   Lab Results  Component Value Date   IRON 114 07/29/2013   FERRITIN 14.8 07/29/2013   Attestation Statements:   Reviewed by clinician on day of visit: allergies, medications, problem list, medical history, surgical history, family history, social history, and previous encounter notes.  IMichaelene Song, am acting as transcriptionist for Abby Potash, PA-C   I have reviewed the above documentation for accuracy and completeness, and I agree with the above. Abby Potash, PA-C

## 2019-07-15 LAB — HEMOGLOBIN A1C
Est. average glucose Bld gHb Est-mCnc: 108 mg/dL
Hgb A1c MFr Bld: 5.4 % (ref 4.8–5.6)

## 2019-07-15 LAB — COMPREHENSIVE METABOLIC PANEL
ALT: 16 IU/L (ref 0–32)
AST: 16 IU/L (ref 0–40)
Albumin/Globulin Ratio: 1.1 — ABNORMAL LOW (ref 1.2–2.2)
Albumin: 3.8 g/dL (ref 3.8–4.8)
Alkaline Phosphatase: 76 IU/L (ref 39–117)
BUN/Creatinine Ratio: 21 (ref 9–23)
BUN: 16 mg/dL (ref 6–20)
Bilirubin Total: 0.3 mg/dL (ref 0.0–1.2)
CO2: 23 mmol/L (ref 20–29)
Calcium: 9.2 mg/dL (ref 8.7–10.2)
Chloride: 105 mmol/L (ref 96–106)
Creatinine, Ser: 0.77 mg/dL (ref 0.57–1.00)
GFR calc Af Amer: 115 mL/min/{1.73_m2} (ref >59)
GFR calc non Af Amer: 100 mL/min/{1.73_m2} (ref >59)
Globulin, Total: 3.5 g/dL (ref 1.5–4.5)
Glucose: 76 mg/dL (ref 65–99)
Potassium: 4.2 mmol/L (ref 3.5–5.2)
Sodium: 141 mmol/L (ref 134–144)
Total Protein: 7.3 g/dL (ref 6.0–8.5)

## 2019-07-15 LAB — INSULIN, RANDOM: INSULIN: 4.5 u[IU]/mL (ref 2.6–24.9)

## 2019-07-15 LAB — LIPID PANEL WITH LDL/HDL RATIO
Cholesterol, Total: 135 mg/dL (ref 100–199)
HDL: 52 mg/dL (ref >39)
LDL Chol Calc (NIH): 73 mg/dL (ref 0–99)
LDL/HDL Ratio: 1.4 ratio (ref 0.0–3.2)
Triglycerides: 40 mg/dL (ref 0–149)
VLDL Cholesterol Cal: 10 mg/dL (ref 5–40)

## 2019-07-15 LAB — VITAMIN D 25 HYDROXY (VIT D DEFICIENCY, FRACTURES): Vit D, 25-Hydroxy: 53.7 ng/mL (ref 30.0–100.0)

## 2019-07-25 ENCOUNTER — Ambulatory Visit: Payer: BC Managed Care – PPO | Attending: Internal Medicine

## 2019-07-25 DIAGNOSIS — Z23 Encounter for immunization: Secondary | ICD-10-CM | POA: Insufficient documentation

## 2019-07-25 NOTE — Progress Notes (Signed)
   Covid-19 Vaccination Clinic  Name:  [name]    MRN: [id number] DOB: Oct 29, 1980  07/25/2019  [name] was observed post Covid-19 immunization for 15 minutes without incidence. She was provided with Vaccine Information Sheet and instruction to access the V-Safe system.   [name] was instructed to call 911 with any severe reactions post vaccine: [name] Difficulty breathing  . Swelling of your face and throat  . A fast heartbeat  . A bad rash all over your body  . Dizziness and weakness    Immunizations Administered    Name Date Dose VIS Date Route   Pfizer COVID-19 Vaccine 07/25/2019  5:09 PM 0.3 mL 05/08/2019 Intramuscular   Manufacturer: [place]   Lot: [id number]   [name]: [id number]

## 2019-07-27 ENCOUNTER — Encounter: Payer: Self-pay | Admitting: Internal Medicine

## 2019-08-03 ENCOUNTER — Ambulatory Visit (INDEPENDENT_AMBULATORY_CARE_PROVIDER_SITE_OTHER): Payer: BC Managed Care – PPO | Admitting: Physician Assistant

## 2019-08-04 ENCOUNTER — Encounter: Payer: Self-pay | Admitting: Internal Medicine

## 2019-08-13 ENCOUNTER — Encounter (INDEPENDENT_AMBULATORY_CARE_PROVIDER_SITE_OTHER): Payer: Self-pay | Admitting: Family Medicine

## 2019-08-13 ENCOUNTER — Other Ambulatory Visit: Payer: Self-pay

## 2019-08-13 ENCOUNTER — Ambulatory Visit (INDEPENDENT_AMBULATORY_CARE_PROVIDER_SITE_OTHER): Payer: BC Managed Care – PPO | Admitting: Family Medicine

## 2019-08-13 VITALS — BP 90/60 | HR 80 | Temp 98.3°F | Ht 63.0 in | Wt 208.0 lb

## 2019-08-13 DIAGNOSIS — Z6837 Body mass index (BMI) 37.0-37.9, adult: Secondary | ICD-10-CM

## 2019-08-13 DIAGNOSIS — R7303 Prediabetes: Secondary | ICD-10-CM | POA: Insufficient documentation

## 2019-08-13 NOTE — Progress Notes (Signed)
Chief Complaint:   OBESITY [name] is here to discuss her progress with her obesity treatment plan along with follow-up of her obesity related diagnoses. [name] is on the Category 3 Plan and states she is following her eating plan approximately 60% of the time. Rolonda states she is exercising for 0 minutes 0 times per week.  Today's visit was #: 49 Starting weight: 293 lbs Starting date: 10/02/2017 Today's weight: 208 lbs Today's date: 08/13/2019 Total lbs lost to date: 85 lbs Total lbs lost since last in-office visit: 0  Interim History: [name] says her mother passed away suddenly within the past 2 weeks from [place] which has caused her to deviate some from the plan. .  She is using Category 3 for a guideline, but mostly journaling.  She has continued to journal over these past few weeks. She struggles to get in enough water at school.  She is a Pharmacist, hospital.  Subjective:   1. Prediabetes Her A1c is up to 5.4 from 5.2.  She is not on metformin.  She does report eating more carbs over Christmas. Wewent over labs today.   Lab Results  Component Value Date   HGBA1C 5.4 07/14/2019   Lab Results  Component Value Date   INSULIN 4.5 07/14/2019   INSULIN 4.2 03/24/2019   INSULIN 5.6 12/17/2018   INSULIN 14.2 08/26/2018   INSULIN 3.5 05/08/2018   Assessment/Plan:   1. Prediabetes [name] has agreed to continue the meal plan.  2. Class 2 severe obesity with serious comorbidity and body mass index (BMI) of 37.0 to 37.9 in adult, unspecified obesity type (HCC) [name] is currently in the action stage of change. As such, her goal is to continue with weight loss efforts. She has agreed to the Category 3 Plan and keeping a food journal and adhering to recommended goals of 1400-1500 calories or 95 grams of protein daily.   Exercise goals: She start exercising again ( "Walk at Home" videos).  Behavioral modification strategies: increasing lean protein intake, decreasing simple carbohydrates and  planning for success.  [name] has agreed to follow-up with our clinic in 3 weeks. She was informed of the importance of frequent follow-up visits to maximize her success with intensive lifestyle modifications for her multiple health conditions.   Objective:   Blood pressure 90/60, pulse 80, temperature 98.3 F (36.8 C), temperature source Oral, height 5' 3"  (1.6 m), weight 208 lb (94.3 kg), last menstrual period 07/25/2019, SpO2 100 %. Body mass index is 36.85 kg/m.  General: Cooperative, alert, well developed, in no acute distress. HEENT: Conjunctivae and lids unremarkable. Cardiovascular: Regular rhythm.  Lungs: Normal work of breathing. Neurologic: No focal deficits.   Lab Results  Component Value Date   CREATININE 0.77 07/14/2019   BUN 16 07/14/2019   NA 141 07/14/2019   K 4.2 07/14/2019   CL 105 07/14/2019   CO2 23 07/14/2019   Lab Results  Component Value Date   ALT 16 07/14/2019   AST 16 07/14/2019   ALKPHOS 76 07/14/2019   BILITOT 0.3 07/14/2019   Lab Results  Component Value Date   HGBA1C 5.4 07/14/2019   HGBA1C 5.2 03/24/2019   HGBA1C 5.3 12/17/2018   HGBA1C 5.5 08/26/2018   HGBA1C 5.7 (H) 05/08/2018   Lab Results  Component Value Date   INSULIN 4.5 07/14/2019   INSULIN 4.2 03/24/2019   INSULIN 5.6 12/17/2018   INSULIN 14.2 08/26/2018   INSULIN 3.5 05/08/2018   Lab Results  Component Value Date  TSH 1.130 10/02/2017   Lab Results  Component Value Date   CHOL 135 07/14/2019   HDL 52 07/14/2019   LDLCALC 73 07/14/2019   TRIG 40 07/14/2019   CHOLHDL 3 07/23/2017   Lab Results  Component Value Date   WBC 11.8 (H) 12/23/2018   HGB 13.8 12/23/2018   HCT 44.9 12/23/2018   MCV 91.8 12/23/2018   PLT 363 12/23/2018   Lab Results  Component Value Date   IRON 114 07/29/2013   FERRITIN 14.8 07/29/2013   Attestation Statements:   Reviewed by clinician on day of visit: allergies, medications, problem list, medical history, surgical history,  family history, social history, and previous encounter notes.  I, Water quality scientist, CMA, am acting as Location manager for [employer], FNP-C.  I have reviewed the above documentation for accuracy and completeness, and I agree with the above. -  Georgianne Fick, FNP

## 2019-08-15 ENCOUNTER — Ambulatory Visit: Payer: BC Managed Care – PPO | Attending: Internal Medicine

## 2019-08-15 DIAGNOSIS — Z23 Encounter for immunization: Secondary | ICD-10-CM

## 2019-08-15 NOTE — Progress Notes (Signed)
   Covid-19 Vaccination Clinic  Name:  [name]    MRN: [id number] DOB: 1980-09-08  08/15/2019  [name] was observed post Covid-19 immunization for 15 minutes without incident. She was provided with Vaccine Information Sheet and instruction to access the V-Safe system.   [name] was instructed to call 911 with any severe reactions post vaccine: [name] Difficulty breathing  . Swelling of face and throat  . A fast heartbeat  . A bad rash all over body  . Dizziness and weakness   Immunizations Administered    Name Date Dose VIS Date Route   Pfizer COVID-19 Vaccine 08/15/2019  9:12 AM 0.3 mL 05/08/2019 Intramuscular   Manufacturer: [place]   Lot: [id number]   [place]: [id number]

## 2019-08-25 ENCOUNTER — Other Ambulatory Visit: Payer: Self-pay

## 2019-08-25 NOTE — Progress Notes (Signed)
Chief Complaint  Patient presents with  . Annual Exam    Pt c/o breast rash sx 3 day allergy issues and wanting something for hair and nail growth.     HPI: Patient  [name]  39 y.o. comes in today for Preventive Health Care visit  She ius in weight management and has lost almost 90 # so far .  Hair dresser  Advised supplement ( biotin [employer] package) and said see pcp  Area on right  Scalp with thinning hair   No fever new meds  Mom passed covid recently  And  Other had disease she has had the vaccine series and now back in call in person 4 f dn then 5 days   Per week   precautions at school   Allergy taking allegra sneezingstuffy itching  etc what to take ? Check asymptomatic rash on chest since cleaned with clorox  No sx   In IVF plans   Health Maintenance  Topic Date Due  . HIV Screening  Never done  . PAP SMEAR-Modifier  01/27/2020  . TETANUS/TDAP  05/23/2025  . INFLUENZA VACCINE  Completed   Health Maintenance Review LIFESTYLE:  Exercise:  Off and on exercises .   Tobacco/ETS: no Alcohol:  no Sugar beverages:no Sleep: 5.5   Drug use: no HH of  2  No pets  Work: Pharmacist, community .      ROS:  GEN/ HEENT: No fever, significant weight changes sweats headaches vision problems hearing changes, CV/ PULM; No chest pain shortness of breath cough, syncope,edema  change in exercise tolerance. GI /GU: No adominal pain, vomiting, change in bowel habits. No blood in the stool. No significant GU symptoms. SKIN/HEME: ,no acute skin rashes suspicious lesions or bleeding. No lymphadenopathy, nodules, masses.  NEURO/ PSYCH:  No neurologic signs such as weakness numbness. No depression anxiety. IMM/ Allergy: No unusual infections.  Allergy .   REST of 12 system review negative except as per HPI   Past Medical History:  Diagnosis Date  . Abrasion of upper arm with infection, left, sequela    started antibiotics lef arm oct 4th started antibiotics healing well  .  Anemia    hx of 2 yrs ago  . Chicken pox   . Chronic rhinitis   . Contact dermatitis and other eczema, due to unspecified cause   . Crohn's 11-15-11   no issues in 2 yrs  . Fibroids 11-15-11   remains with fibroids  . Hernia   . Incisional hernia, RLQ 10/02/2011  . Infertility, female   . Mononucleosis   . Obesity   . Pilonidal abscess   . Rectal abscess   . 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 Vitamin D deficiency     Past Surgical History:  Procedure Laterality Date  . APPENDECTOMY  7/06  . COLONOSCOPY WITH PROPOFOL N/A 03/07/2017   Procedure: COLONOSCOPY WITH PROPOFOL;  Surgeon: Milus Banister, MD;  Location: [facility];  Service: Endoscopy;  Laterality: N/A;  . EYE SURGERY  11-15-11   2003-multiple stys  . ileocecal resection  1/11   crohns disease with fisula/stricture. Dr. Ronnald Collum  . INCISIONAL HERNIA REPAIR N/A 12/24/2018   Procedure: OPEN REPAIR VENTRAL INCISIONAL HERNIA WITH MESH PATCH AND PRIMARY REPAIR OF LEFT SPIGELIAN HERNIA;  Surgeon: Armandina Gemma, MD;  Location: [facility];  Service: General;  Laterality: N/A;  . KNEE ARTHROSCOPY  99, 02   Bil.  [name] MYOMECTOMY N/A 11/10/2014  Procedure: MYOMECTOMY;  Surgeon: Everett Graff, MD;  Location: [facility];  Service: Gynecology;  Laterality: N/A;  . PILONIDAL CYST EXCISION  2011, 2012   I&Ds  . VENTRAL HERNIA REPAIR  11/27/2011   Procedure: LAPAROSCOPIC VENTRAL HERNIA;  Surgeon: Adin Hector, MD;  Location: [facility];  Service: General;  Laterality: N/A;  Laparoscopic Exploration & Repair of Hernia in Abdomen  . WISDOM TOOTH EXTRACTION      Family History  Problem Relation Age of Onset  . Aneurysm Mother        brain  . Heart disease Mother 34       heart anyrism  . Healthy Father        fairly  . Diabetes Sister        half sister  . Bipolar disorder Sister        half sister  . Colon cancer Maternal Grandmother   . Stomach cancer Paternal Grandfather   . Multiple sclerosis Sister     Social  History   Socioeconomic History  . Marital status: Married    Spouse name: Quandra Fedorchak  . Number of children: Not on file  . Years of education: Not on file  . Highest education level: Not on file  Occupational History  . Occupation: Music therapist  Tobacco Use  . Smoking status: Never Smoker  . Smokeless tobacco: Never Used  Substance and Sexual Activity  . Alcohol use: No  . Drug use: No  . Sexual activity: Yes    Partners: Male  Other Topics Concern  . 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 Difficulty of Paying Living Expenses:   Food Insecurity:   . Worried About Charity fundraiser in the Last Year:   . Arboriculturist in the Last Year:   Transportation Needs:   . Film/video editor (Medical):   [name] Lack of Transportation (Non-Medical):   Physical Activity:   . Days of Exercise per Week:   . Minutes of Exercise per Session:   Stress:   . Feeling of Stress :   Social Connections:   . Frequency of Communication with Friends and Family:   . Frequency of Social Gatherings with Friends and Family:   . Attends Religious Services:   . Active Member of Clubs or Organizations:   . Attends Archivist Meetings:   [name] Marital Status:     Outpatient Medications Prior to Visit  Medication Sig Dispense Refill  . Calcium Carb-Cholecalciferol (CALCIUM 600+D3 PO) Take 1 tablet by mouth daily.    . fexofenadine (ALLEGRA) 180 MG tablet Take 180 mg by mouth daily.    . InFLIXimab (REMICADE IV) Inject into the vein every 8 (eight) weeks.     . Multiple Vitamin (MULTIVITAMIN PO) Take 1 tablet by mouth daily.    . Vitamin D, Cholecalciferol, 50 MCG (2000 UT) CAPS Take 4,000 Units by mouth daily.     No  facility-administered medications prior to visit.     EXAM:  BP 92/64   Pulse 81   Temp 98.2 F (36.8 C) (Other (Comment))   Ht _0  (1.6  m)   Wt 209 lb (94.8 kg)   LMP 08/19/2019   SpO2 97%   BMI 37.02 kg/m   Body mass index is 37.02 kg/m. Wt Readings from Last 3 Encounters:  08/26/19 209 lb (94.8 kg)  08/13/19 208 lb (94.3 kg)  07/14/19 205 lb (93 kg)    Physical Exam: Vital signs reviewed IRS:WNIO is a well-developed well-nourished alert cooperative    who appearsr stated age in no acute distress.  HEENT: normocephalic atraumatic , Eyes: PERRL EOM's full, conjunctiva clear, Nares: paten,t no deformity discharge or tenderness., Ears: no deformity EAC's clear TMs with normal landmarks. Mouth: clear OPmasked  NECK: supple without masses, thyromegaly or bruits. CHEST/PULM:  Clear to auscultation and percussion breath sounds equal no wheeze , rales or rhonchi. No chest wall deformities or tenderness. Breast: normal by inspection . No dimpling, discharge, masses, tenderness or discharge . CV: PMI is nondisplaced, S1 S2 no gallops, murmurs, rubs. Peripheral pulses are full without delay.No JVD .  ABDOMEN: Bowel sounds normal nontender  No guard or rebound, no hepato splenomegal no CVA tenderness.  No hernia. Extremtities:  No clubbing cyanosis or edema, no acute joint swelling or redness no focal atrophy NEURO:  Oriented x3, cranial nerves 3-12 appear to be intact, no obvious focal weakness,gait within normal limits no abnormal reflexes or asymmetrical SKIN: No acute rashes normal turgor, color, no bruising or petechiae. Faint blotchy rash medial breast symmetrical  Some hairs chin and min midline chest  Scalp clear  Thinning right  Area near  Ear but no alopecia   Corn braids  PSYCH: Oriented, good eye contact, no obvious depression anxiety, cognition and judgment appear normal. LN: no cervical axillary adenopathy  Lab Results  Component Value Date   WBC 11.8 (H) 12/23/2018     HGB 13.8 12/23/2018   HCT 44.9 12/23/2018   PLT 363 12/23/2018   GLUCOSE 76 07/14/2019   CHOL 135 07/14/2019   TRIG 40 07/14/2019   HDL 52 07/14/2019   LDLCALC 73 07/14/2019   ALT 16 07/14/2019   AST 16 07/14/2019   NA 141 07/14/2019   K 4.2 07/14/2019   CL 105 07/14/2019   CREATININE 0.77 07/14/2019   BUN 16 07/14/2019   CO2 23 07/14/2019   TSH 1.130 10/02/2017   INR 1.47 05/26/2009   HGBA1C 5.4 07/14/2019    BP Readings from Last 3 Encounters:  08/26/19 92/64  08/13/19 90/60  07/14/19 111/71    Labplan  reviewed with patient   ASSESSMENT AND PLAN:  Discussed the following assessment and plan:    ICD-10-CM   1. Visit for preventive health examination  Z00.00 TSH    T4, free    T3, free    CBC with Differential/Platelet    Sedimentation rate  2. Hair thinning  L65.9 TSH    T4, free    T3, free    CBC with Differential/Platelet    Sedimentation rate  3. High risk medication use  Z79.899 TSH    T4, free    T3, free    CBC with Differential/Platelet    Sedimentation rate  4. Crohn's disease of both small and large intestine without complication (HCC)  E70.35 TSH    T4, free    T3, free    CBC with Differential/Platelet    Sedimentation rate  5. Class 2 severe obesity with serious comorbidity and body mass index (BMI) of 36.0 to 36.9 in adult, unspecified obesity type (HCC)  E66.01 TSH  Z68.36 T4, free    T3, free    CBC with Differential/Platelet    Sedimentation rate  6. Need for vaccination against Streptococcus pneumoniae using pneumococcal conjugate vaccine 13  Z23 Pneumococcal polysaccharide vaccine 23-valent greater than or equal to 2yo subcutaneous/IM  7. Rash  R21   8. Seasonal allergic rhinitis, unspecified trigger  J30.2    Return in about 1 year (around 08/25/2020) for depending on results. Nasal steroid add to  Antihistamine  Update pneumovax Can try biotin safe probably with  Reg supplement precautions but not sure will be helpful  Check  tsh etc today . Continue weight management .  Potential hormonal rx  To do ivf can influence hair .  Observe rash since no sx  avoid irritants tec  Patient Care Team: Burnis Medin, MD as PCP - General Milus Banister, [name], [name], MD (Rheumatology) Patient Instructions  Keep up the good work  Condolences about your mom.   Checking thyroid today .  Ok to try biotin  If getting  Scalp rashes or total loss then can see dermatologist    Caution to avoid  Traction alopecia also .   Observe the rash for now     Preventive Care 72-45 Years Old, Female Preventive care refers to visits with your health care provider and lifestyle choices that can promote health and wellness. This includes:  A yearly physical exam. This may also be called an annual well check.  Regular dental visits and eye exams.  Immunizations.  Screening for certain conditions.  Healthy lifestyle choices, such as eating a healthy diet, getting regular exercise, not using drugs or products that contain nicotine and tobacco, and limiting alcohol use. What can I expect for my preventive care visit? Physical exam Your health care provider will check your:  Height and weight. This may be used to calculate body mass index (BMI), which tells if you are at a healthy weight.  Heart rate and blood pressure.  Skin for abnormal spots. Counseling Your health care provider may ask you questions about your:  Alcohol, tobacco, and drug use.  Emotional well-being.  Home and relationship well-being.  Sexual activity.  Eating habits.  Work and work Statistician.  Method of birth control.  Menstrual cycle.  Pregnancy history. What immunizations do I need?  Influenza (flu) vaccine  This is recommended every year. Tetanus, diphtheria, and pertussis (Tdap) vaccine  You may need a Td booster every 10 years. Varicella (chickenpox)  vaccine  You may need this if you have not been vaccinated. Human papillomavirus (HPV) vaccine  If recommended by your health care provider, you may need three doses over 6 months. Measles, mumps, and rubella (MMR) vaccine  You may need at least one dose of MMR. You may also need a second dose. Meningococcal conjugate (MenACWY) vaccine  One dose is recommended if you are age 22-21 years and a first-year college student living in a residence hall, or if you have one of several medical conditions. You may also need additional booster doses. Pneumococcal conjugate (PCV13) vaccine  You may need this if you have certain conditions and were not previously vaccinated. Pneumococcal polysaccharide (PPSV23) vaccine  You may need one or two doses if you smoke cigarettes or if you have certain conditions. Hepatitis A vaccine  You may need this if you have certain conditions or if you travel or work in places where you may be exposed to hepatitis A.  Hepatitis B vaccine  You may need this if you have certain conditions or if you travel or work in places where you may be exposed to hepatitis B. Haemophilus influenzae type b (Hib) vaccine  You may need this if you have certain conditions. You may receive vaccines as individual doses or as more than one vaccine together in one shot (combination vaccines). Talk with your health care provider about the risks and benefits of combination vaccines. What tests do I need?  Blood tests  Lipid and cholesterol levels. These may be checked every 5 years starting at age 35.  Hepatitis C test.  Hepatitis B test. Screening  Diabetes screening. This is done by checking your blood sugar (glucose) after you have not eaten for a while (fasting).  Sexually transmitted disease (STD) testing.  BRCA-related cancer screening. This may be done if you have a family history of breast, ovarian, tubal, or peritoneal cancers.  Pelvic exam and Pap test. This may be  done every 3 years starting at age 24. Starting at age 43, this may be done every 5 years if you have a Pap test in combination with an HPV test. Talk with your health care provider about your test results, treatment options, and if necessary, the need for more tests. Follow these instructions at home: Eating and drinking   Eat a diet that includes fresh fruits and vegetables, whole grains, lean protein, and low-fat dairy.  Take vitamin and mineral supplements as recommended by your health care provider.  Do not drink alcohol if: ? Your health care provider tells you not to drink. ? You are pregnant, may be pregnant, or are planning to become pregnant.  If you drink alcohol: ? Limit how much you have to 0-1 drink a day. ? Be aware of how much alcohol is in your drink. In the [place] one drink equals one 12 oz bottle of beer (355 mL), one 5 oz glass of wine (148 mL), or one 1 oz glass of hard liquor (44 mL). Lifestyle  Take daily care of your teeth and gums.  Stay active. Exercise for at least 30 minutes on 5 or more days each week.  Do not use any products that contain nicotine or tobacco, such as cigarettes, e-cigarettes, and chewing tobacco. If you need help quitting, ask your health care provider.  If you are sexually active, practice safe sex. Use a condom or other form of birth control (contraception) in order to prevent pregnancy and STIs (sexually transmitted infections). If you plan to become pregnant, see your health care provider for a preconception visit. What's next?  Visit your health care provider once a year for a well check visit.  Ask your health care provider how often you should have your eyes and teeth checked.  Stay up to date on all vaccines. This information is not intended to replace advice given to you by your health care provider. Make sure you discuss any questions you have with your health care provider. Document Revised: 01/23/2018 Document Reviewed:  01/23/2018 Elsevier Patient Education  2020 Laurel Makell Drohan M.D.

## 2019-08-26 ENCOUNTER — Ambulatory Visit (INDEPENDENT_AMBULATORY_CARE_PROVIDER_SITE_OTHER): Payer: BC Managed Care – PPO | Admitting: Internal Medicine

## 2019-08-26 ENCOUNTER — Encounter: Payer: Self-pay | Admitting: Internal Medicine

## 2019-08-26 VITALS — BP 92/64 | HR 81 | Temp 98.2°F | Ht 63.0 in | Wt 209.0 lb

## 2019-08-26 DIAGNOSIS — Z23 Encounter for immunization: Secondary | ICD-10-CM | POA: Diagnosis not present

## 2019-08-26 DIAGNOSIS — Z6836 Body mass index (BMI) 36.0-36.9, adult: Secondary | ICD-10-CM

## 2019-08-26 DIAGNOSIS — R21 Rash and other nonspecific skin eruption: Secondary | ICD-10-CM

## 2019-08-26 DIAGNOSIS — L659 Nonscarring hair loss, unspecified: Secondary | ICD-10-CM

## 2019-08-26 DIAGNOSIS — K5080 Crohn's disease of both small and large intestine without complications: Secondary | ICD-10-CM

## 2019-08-26 DIAGNOSIS — J302 Other seasonal allergic rhinitis: Secondary | ICD-10-CM | POA: Diagnosis not present

## 2019-08-26 DIAGNOSIS — Z79899 Other long term (current) drug therapy: Secondary | ICD-10-CM | POA: Diagnosis not present

## 2019-08-26 DIAGNOSIS — Z0000 Encounter for general adult medical examination without abnormal findings: Secondary | ICD-10-CM

## 2019-08-26 LAB — CBC WITH DIFFERENTIAL/PLATELET
Basophils Absolute: 0.1 10*3/uL (ref 0.0–0.1)
Basophils Relative: 0.6 % (ref 0.0–3.0)
Eosinophils Absolute: 0.1 10*3/uL (ref 0.0–0.7)
Eosinophils Relative: 1.1 % (ref 0.0–5.0)
HCT: 43.9 % (ref 36.0–46.0)
Hemoglobin: 14.5 g/dL (ref 12.0–15.0)
Lymphocytes Relative: 32.9 % (ref 12.0–46.0)
Lymphs Abs: 3.3 10*3/uL (ref 0.7–4.0)
MCHC: 32.9 g/dL (ref 30.0–36.0)
MCV: 91.9 fl (ref 78.0–100.0)
Monocytes Absolute: 0.8 10*3/uL (ref 0.1–1.0)
Monocytes Relative: 8.1 % (ref 3.0–12.0)
Neutro Abs: 5.7 10*3/uL (ref 1.4–7.7)
Neutrophils Relative %: 57.3 % (ref 43.0–77.0)
Platelets: 316 10*3/uL (ref 150.0–400.0)
RBC: 4.78 Mil/uL (ref 3.87–5.11)
RDW: 13.8 % (ref 11.5–15.5)
WBC: 10 10*3/uL (ref 4.0–10.5)

## 2019-08-26 LAB — TSH: TSH: 0.89 u[IU]/mL (ref 0.35–4.50)

## 2019-08-26 LAB — T4, FREE: Free T4: 0.81 ng/dL (ref 0.60–1.60)

## 2019-08-26 LAB — SEDIMENTATION RATE: Sed Rate: 29 mm/hr — ABNORMAL HIGH (ref 0–20)

## 2019-08-26 LAB — T3, FREE: T3, Free: 3.3 pg/mL (ref 2.3–4.2)

## 2019-08-26 NOTE — Progress Notes (Signed)
Thyroid and blood count are normal  No change in plans

## 2019-08-26 NOTE — Patient Instructions (Addendum)
Keep up the good work  Condolences about your mom.   Checking thyroid today .  Ok to try biotin  If getting  Scalp rashes or total loss then can see dermatologist    Caution to avoid  Traction alopecia also .   Observe the rash for now     Preventive Care 37-37 Years Old, Female Preventive care refers to visits with your health care provider and lifestyle choices that can promote health and wellness. This includes:  A yearly physical exam. This may also be called an annual well check.  Regular dental visits and eye exams.  Immunizations.  Screening for certain conditions.  Healthy lifestyle choices, such as eating a healthy diet, getting regular exercise, not using drugs or products that contain nicotine and tobacco, and limiting alcohol use. What can I expect for my preventive care visit? Physical exam Your health care provider will check your:  Height and weight. This may be used to calculate body mass index (BMI), which tells if you are at a healthy weight.  Heart rate and blood pressure.  Skin for abnormal spots. Counseling Your health care provider may ask you questions about your:  Alcohol, tobacco, and drug use.  Emotional well-being.  Home and relationship well-being.  Sexual activity.  Eating habits.  Work and work Statistician.  Method of birth control.  Menstrual cycle.  Pregnancy history. What immunizations do I need?  Influenza (flu) vaccine  This is recommended every year. Tetanus, diphtheria, and pertussis (Tdap) vaccine  You may need a Td booster every 10 years. Varicella (chickenpox) vaccine  You may need this if you have not been vaccinated. Human papillomavirus (HPV) vaccine  If recommended by your health care provider, you may need three doses over 6 months. Measles, mumps, and rubella (MMR) vaccine  You may need at least one dose of MMR. You may also need a second dose. Meningococcal conjugate (MenACWY) vaccine  One dose is  recommended if you are age 2-21 years and a first-year college student living in a residence hall, or if you have one of several medical conditions. You may also need additional booster doses. Pneumococcal conjugate (PCV13) vaccine  You may need this if you have certain conditions and were not previously vaccinated. Pneumococcal polysaccharide (PPSV23) vaccine  You may need one or two doses if you smoke cigarettes or if you have certain conditions. Hepatitis A vaccine  You may need this if you have certain conditions or if you travel or work in places where you may be exposed to hepatitis A. Hepatitis B vaccine  You may need this if you have certain conditions or if you travel or work in places where you may be exposed to hepatitis B. Haemophilus influenzae type b (Hib) vaccine  You may need this if you have certain conditions. You may receive vaccines as individual doses or as more than one vaccine together in one shot (combination vaccines). Talk with your health care provider about the risks and benefits of combination vaccines. What tests do I need?  Blood tests  Lipid and cholesterol levels. These may be checked every 5 years starting at age 105.  Hepatitis C test.  Hepatitis B test. Screening  Diabetes screening. This is done by checking your blood sugar (glucose) after you have not eaten for a while (fasting).  Sexually transmitted disease (STD) testing.  BRCA-related cancer screening. This may be done if you have a family history of breast, ovarian, tubal, or peritoneal cancers.  Pelvic exam and  Pap test. This may be done every 3 years starting at age 37. Starting at age 65, this may be done every 5 years if you have a Pap test in combination with an HPV test. Talk with your health care provider about your test results, treatment options, and if necessary, the need for more tests. Follow these instructions at home: Eating and drinking   Eat a diet that includes fresh  fruits and vegetables, whole grains, lean protein, and low-fat dairy.  Take vitamin and mineral supplements as recommended by your health care provider.  Do not drink alcohol if: ? Your health care provider tells you not to drink. ? You are pregnant, may be pregnant, or are planning to become pregnant.  If you drink alcohol: ? Limit how much you have to 0-1 drink a day. ? Be aware of how much alcohol is in your drink. In the [place] one drink equals one 12 oz bottle of beer (355 mL), one 5 oz glass of wine (148 mL), or one 1 oz glass of hard liquor (44 mL). Lifestyle  Take daily care of your teeth and gums.  Stay active. Exercise for at least 30 minutes on 5 or more days each week.  Do not use any products that contain nicotine or tobacco, such as cigarettes, e-cigarettes, and chewing tobacco. If you need help quitting, ask your health care provider.  If you are sexually active, practice safe sex. Use a condom or other form of birth control (contraception) in order to prevent pregnancy and STIs (sexually transmitted infections). If you plan to become pregnant, see your health care provider for a preconception visit. What's next?  Visit your health care provider once a year for a well check visit.  Ask your health care provider how often you should have your eyes and teeth checked.  Stay up to date on all vaccines. This information is not intended to replace advice given to you by your health care provider. Make sure you discuss any questions you have with your health care provider. Document Revised: 01/23/2018 Document Reviewed: 01/23/2018 Elsevier Patient Education  2020 [employer].

## 2019-09-08 ENCOUNTER — Encounter (INDEPENDENT_AMBULATORY_CARE_PROVIDER_SITE_OTHER): Payer: Self-pay | Admitting: Physician Assistant

## 2019-09-08 ENCOUNTER — Ambulatory Visit (INDEPENDENT_AMBULATORY_CARE_PROVIDER_SITE_OTHER): Payer: BC Managed Care – PPO | Admitting: Physician Assistant

## 2019-09-08 ENCOUNTER — Other Ambulatory Visit: Payer: Self-pay

## 2019-09-08 VITALS — BP 96/64 | HR 79 | Temp 98.3°F | Ht 63.0 in | Wt 210.0 lb

## 2019-09-08 DIAGNOSIS — E559 Vitamin D deficiency, unspecified: Secondary | ICD-10-CM | POA: Diagnosis not present

## 2019-09-08 DIAGNOSIS — Z6837 Body mass index (BMI) 37.0-37.9, adult: Secondary | ICD-10-CM

## 2019-09-08 NOTE — Progress Notes (Signed)
Chief Complaint:   OBESITY [name] is here to discuss her progress with her obesity treatment plan along with follow-up of her obesity related diagnoses. [name] is keeping a food journal and adhering to recommended goals of 1400-1500 calories and 85 grams of protein and states she is following her eating plan approximately 85% of the time. [name] states she is exercising 0 minutes 0 times per week.  Today's visit was #: 89 Starting weight: 293 lbs Starting date: 10/02/2017 Today's weight: 210 lbs Today's date: 09/08/2019 Total lbs lost to date: 83 Total lbs lost since last in-office visit: 0  Interim History: [name] states that she has been hitting both her calorie and protein goals most days. 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  Subjective:   Vitamin D deficiency. Raylan is on Vitamin D 4,000 units daily. No nausea, vomiting, or muscle weakness. Last Vitamin D 53.7 on 07/14/2019.  Assessment/Plan:   Vitamin D deficiency. Low Vitamin D level contributes to fatigue and are associated with obesity, breast, and colon cancer. She agrees to continue to take Vitamin D and will follow-up for routine testing of Vitamin D, at least 2-3 times per year to avoid over-replacement.  Class 2 severe obesity with serious comorbidity and body mass index (BMI) of 37.0 to 37.9 in adult, unspecified obesity type ([place]).  [name] is currently in the action stage of change. As such, her goal is to continue with weight loss efforts. She has agreed to keeping a food journal and adhering to recommended goals of 1400-1550 calories and 95 grams of protein.   Exercise goals: For substantial health benefits, adults should do at least 150 minutes (2 hours and 30 minutes) a week of moderate-intensity, or 75 minutes (1 hour and 15 minutes) a week of vigorous-intensity aerobic physical activity, or an equivalent combination of  moderate- and vigorous-intensity aerobic activity. Aerobic activity should be performed in episodes of at least 10 minutes, and preferably, it should be spread throughout the week.  Behavioral modification strategies: increasing water intake, meal planning and cooking strategies and keeping healthy foods in the home.  [name] has agreed to follow-up with our clinic in 2 weeks. She was informed of the importance of frequent follow-up visits to maximize her success with intensive lifestyle modifications for her multiple health conditions.   Objective:   Blood pressure 96/64, pulse 79, temperature 98.3 F (36.8 C), temperature source Oral, height 5' 3"  (1.6 m), weight 210 lb (95.3 kg), last menstrual period 08/19/2019, SpO2 99 %. Body mass index is 37.2 kg/m.  General: Cooperative, alert, well developed, in no acute distress. HEENT: Conjunctivae and lids unremarkable. Cardiovascular: Regular rhythm.  Lungs: Normal work of breathing. Neurologic: No focal deficits.   Lab Results  Component Value Date   CREATININE 0.77 07/14/2019   BUN 16 07/14/2019   NA 141 07/14/2019   K 4.2 07/14/2019   CL 105 07/14/2019   CO2 23 07/14/2019   Lab Results  Component Value Date   ALT 16 07/14/2019   AST 16 07/14/2019   ALKPHOS 76 07/14/2019   BILITOT 0.3 07/14/2019   Lab Results  Component Value Date   HGBA1C 5.4 07/14/2019   HGBA1C 5.2 03/24/2019   HGBA1C 5.3 12/17/2018   HGBA1C 5.5 08/26/2018   HGBA1C 5.7 (H) 05/08/2018   Lab Results  Component Value Date   INSULIN 4.5 07/14/2019   INSULIN 4.2 03/24/2019  INSULIN 5.6 12/17/2018   INSULIN 14.2 08/26/2018   INSULIN 3.5 05/08/2018   Lab Results  Component Value Date   TSH 0.89 08/26/2019   Lab Results  Component Value Date   CHOL 135 07/14/2019   HDL 52 07/14/2019   LDLCALC 73 07/14/2019   TRIG 40 07/14/2019   CHOLHDL 3 07/23/2017   Lab Results  Component Value Date   WBC 10.0 08/26/2019   HGB 14.5 08/26/2019   HCT 43.9  08/26/2019   MCV 91.9 08/26/2019   PLT 316.0 08/26/2019   Lab Results  Component Value Date   IRON 114 07/29/2013   FERRITIN 14.8 07/29/2013   Attestation Statements:   Reviewed by clinician on day of visit: allergies, medications, problem list, medical history, surgical history, family history, social history, and previous encounter notes.  Time spent on visit including pre-visit chart review and post-visit charting and care was 24 minutes.   IMichaelene Song, am acting as transcriptionist for Abby Potash, PA-C   I have reviewed the above documentation for accuracy and completeness, and I agree with the above. Abby Potash, PA-C

## 2019-09-30 ENCOUNTER — Encounter (INDEPENDENT_AMBULATORY_CARE_PROVIDER_SITE_OTHER): Payer: Self-pay | Admitting: Physician Assistant

## 2019-09-30 ENCOUNTER — Ambulatory Visit (INDEPENDENT_AMBULATORY_CARE_PROVIDER_SITE_OTHER): Payer: BC Managed Care – PPO | Admitting: Physician Assistant

## 2019-09-30 ENCOUNTER — Other Ambulatory Visit: Payer: Self-pay

## 2019-09-30 VITALS — BP 112/71 | HR 77 | Temp 98.4°F | Ht 63.0 in | Wt 211.0 lb

## 2019-09-30 DIAGNOSIS — R7303 Prediabetes: Secondary | ICD-10-CM

## 2019-09-30 DIAGNOSIS — R0602 Shortness of breath: Secondary | ICD-10-CM | POA: Diagnosis not present

## 2019-09-30 DIAGNOSIS — Z9189 Other specified personal risk factors, not elsewhere classified: Secondary | ICD-10-CM | POA: Diagnosis not present

## 2019-09-30 DIAGNOSIS — Z6837 Body mass index (BMI) 37.0-37.9, adult: Secondary | ICD-10-CM

## 2019-09-30 NOTE — Progress Notes (Signed)
Chief Complaint:   OBESITY [name] is here to discuss her progress with her obesity treatment plan along with follow-up of her obesity related diagnoses. [name] is keeping a food journal and adhering to recommended goals of 1400-1550 calories and 95 grams of protein and states she is following her eating plan approximately 90% of the time. [name] states she is walking 15-20 minutes 3 times per week.  Today's visit was #: 44 Starting weight: 293 lbs Starting date: 10/02/2017 Today's weight: 211 lbs Today's date: 09/30/2019 Total lbs lost to date: 82 Total lbs lost since last in-office visit: 0  Interim History: [name] just started the IVF process and is not feeling well after applying the estrogen patch. She starts injections next week. She reports averaging 1500 calories and 100 grams of protein daily. She denies excessive hunger.  Subjective:   Shortness of breath on exertion. Mitchelle notes increasing shortness of breath with exertion and seems to be worsening over time with weight gain. She notes getting out of breath sooner with activity than she used to. [name] denies shortness of breath at rest or orthopnea. No dizziness or lightheadedness.  Prediabetes. [name] has a diagnosis of prediabetes based on her elevated HgA1c and was informed this puts her at greater risk of developing diabetes. She continues to work on diet and exercise to decrease her risk of diabetes. She denies nausea, vomiting, or diarrhea. No polyphagia. She is on metformin.  Lab Results  Component Value Date   HGBA1C 5.4 07/14/2019   Lab Results  Component Value Date   INSULIN 4.5 07/14/2019   INSULIN 4.2 03/24/2019   INSULIN 5.6 12/17/2018   INSULIN 14.2 08/26/2018   INSULIN 3.5 05/08/2018   At risk for diabetes mellitus. [name] is at higher than average risk for developing diabetes due to her obesity.   Assessment/Plan:   Shortness of breath on exertion. Leonilda's shortness of breath appears  to be obesity related and exercise induced. She has agreed to work on weight loss and gradually increase exercise to treat her exercise induced shortness of breath. Will continue to monitor closely. IC performed today showing an RMR of 2026.  Prediabetes. [name] will continue to work on weight loss, exercise, and decreasing simple carbohydrates to help decrease the risk of diabetes. She will continue with metformin as directed.  At risk for diabetes mellitus. [name] was given approximately 15 minutes of diabetes education and counseling today. We discussed intensive lifestyle modifications today with an emphasis on weight loss as well as increasing exercise and decreasing simple carbohydrates in her diet. We also reviewed medication options with an emphasis on risk versus benefit of those discussed.   Repetitive spaced learning was employed today to elicit superior memory formation and behavioral change.  Class 2 severe obesity with serious comorbidity and body mass index (BMI) of 37.0 to 37.9 in adult, unspecified obesity type ([place]).  [name] is currently in the action stage of change. As such, her goal is to continue with weight loss efforts. She has agreed to keeping a food journal and adhering to recommended goals of 1500-1600 calories and 95 grams of protein.   Exercise goals: For substantial health benefits, adults should do at least 150 minutes (2 hours and 30 minutes) a week of moderate-intensity, or 75 minutes (1 hour and 15 minutes) a week of vigorous-intensity aerobic physical activity, or an equivalent combination of moderate- and vigorous-intensity aerobic activity. Aerobic activity should be performed in episodes of at least 10 minutes,  and preferably, it should be spread throughout the week.  Behavioral modification strategies: meal planning and cooking strategies and keeping healthy foods in the home.  [name] has agreed to follow-up with our clinic in 3 weeks. She was informed of the  importance of frequent follow-up visits to maximize her success with intensive lifestyle modifications for her multiple health conditions.   Objective:   Blood pressure 112/71, pulse 77, temperature 98.4 F (36.9 C), temperature source Oral, height 5' 3"  (1.6 m), weight 211 lb (95.7 kg), SpO2 98 %. Body mass index is 37.38 kg/m.  General: Cooperative, alert, well developed, in no acute distress. HEENT: Conjunctivae and lids unremarkable. Cardiovascular: Regular rhythm.  Lungs: Normal work of breathing. Neurologic: No focal deficits.   Lab Results  Component Value Date   CREATININE 0.77 07/14/2019   BUN 16 07/14/2019   NA 141 07/14/2019   K 4.2 07/14/2019   CL 105 07/14/2019   CO2 23 07/14/2019   Lab Results  Component Value Date   ALT 16 07/14/2019   AST 16 07/14/2019   ALKPHOS 76 07/14/2019   BILITOT 0.3 07/14/2019   Lab Results  Component Value Date   HGBA1C 5.4 07/14/2019   HGBA1C 5.2 03/24/2019   HGBA1C 5.3 12/17/2018   HGBA1C 5.5 08/26/2018   HGBA1C 5.7 (H) 05/08/2018   Lab Results  Component Value Date   INSULIN 4.5 07/14/2019   INSULIN 4.2 03/24/2019   INSULIN 5.6 12/17/2018   INSULIN 14.2 08/26/2018   INSULIN 3.5 05/08/2018   Lab Results  Component Value Date   TSH 0.89 08/26/2019   Lab Results  Component Value Date   CHOL 135 07/14/2019   HDL 52 07/14/2019   LDLCALC 73 07/14/2019   TRIG 40 07/14/2019   CHOLHDL 3 07/23/2017   Lab Results  Component Value Date   WBC 10.0 08/26/2019   HGB 14.5 08/26/2019   HCT 43.9 08/26/2019   MCV 91.9 08/26/2019   PLT 316.0 08/26/2019   Lab Results  Component Value Date   IRON 114 07/29/2013   FERRITIN 14.8 07/29/2013   Attestation Statements:   Reviewed by clinician on day of visit: allergies, medications, problem list, medical history, surgical history, family history, social history, and previous encounter notes.  IMichaelene Song, am acting as transcriptionist for Abby Potash, PA-C   I have  reviewed the above documentation for accuracy and completeness, and I agree with the above. Abby Potash, PA-C

## 2019-10-22 ENCOUNTER — Other Ambulatory Visit: Payer: Self-pay

## 2019-10-22 ENCOUNTER — Encounter (INDEPENDENT_AMBULATORY_CARE_PROVIDER_SITE_OTHER): Payer: Self-pay | Admitting: Physician Assistant

## 2019-10-22 ENCOUNTER — Ambulatory Visit (INDEPENDENT_AMBULATORY_CARE_PROVIDER_SITE_OTHER): Payer: BC Managed Care – PPO | Admitting: Physician Assistant

## 2019-10-22 VITALS — BP 102/62 | HR 78 | Temp 97.9°F | Ht 63.0 in | Wt 212.0 lb

## 2019-10-22 DIAGNOSIS — E559 Vitamin D deficiency, unspecified: Secondary | ICD-10-CM | POA: Diagnosis not present

## 2019-10-22 DIAGNOSIS — Z6837 Body mass index (BMI) 37.0-37.9, adult: Secondary | ICD-10-CM | POA: Diagnosis not present

## 2019-10-22 DIAGNOSIS — E66812 Obesity, class 2: Secondary | ICD-10-CM

## 2019-10-22 NOTE — Progress Notes (Signed)
Chief Complaint:   OBESITY [name] is here to discuss her progress with her obesity treatment plan along with follow-up of her obesity related diagnoses. [name] is keeping a food journal and adhering to recommended goals of 1500-1600 calories and 95 grams of protein and states she is following her eating plan approximately 93% of the time. [name] states she is exercising 0 minutes 0 times per week.  Today's visit was #: 37 Starting weight: 293 lbs Starting date: 10/02/2017 Today's weight: 212 lbs Today's date: 10/22/2019 Total lbs lost to date: 81 Total lbs lost since last in-office visit: 0  Interim History: [name] reports that she just went through an egg retrieval 4 days ago and is still not feeling well. 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  Subjective:   Vitamin D deficiency. [name] is on Vitamin D. No nausea, vomiting, or muscle weakness. Last Vitamin D 53.7 on 07/14/2019.  Assessment/Plan:   Vitamin D deficiency. Low Vitamin D level contributes to fatigue and are associated with obesity, breast, and colon cancer. She agrees to continue to take Vitamin D and will follow-up for routine testing of Vitamin D, at least 2-3 times per year to avoid over-replacement.  Class 2 severe obesity with serious comorbidity and body mass index (BMI) of 37.0 to 37.9 in adult, unspecified obesity type ([name]).  [name] is currently in the action stage of change. As such, her goal is to continue with weight loss efforts. She has agreed to keeping a food journal and adhering to recommended goals of 1500 calories and 95 grams of protein.   Exercise goals: For substantial health benefits, adults should do at least 150 minutes (2 hours and 30 minutes) a week of moderate-intensity, or 75 minutes (1 hour and 15 minutes) a week of vigorous-intensity aerobic physical activity, or an equivalent combination of moderate- and vigorous-intensity aerobic  activity. Aerobic activity should be performed in episodes of at least 10 minutes, and preferably, it should be spread throughout the week.  Behavioral modification strategies: meal planning and cooking strategies and keeping healthy foods in the home.  [name] has agreed to follow-up with our clinic in 4 weeks. She was informed of the importance of frequent follow-up visits to maximize her success with intensive lifestyle modifications for her multiple health conditions.   Objective:   Blood pressure 102/62, pulse 78, temperature 97.9 F (36.6 C), temperature source Oral, height 5' 3"  (1.6 m), weight 212 lb (96.2 kg), SpO2 98 %. Body mass index is 37.55 kg/m.  General: Cooperative, alert, well developed, in no acute distress. HEENT: Conjunctivae and lids unremarkable. Cardiovascular: Regular rhythm.  Lungs: Normal work of breathing. Neurologic: No focal deficits.   Lab Results  Component Value Date   CREATININE 0.77 07/14/2019   BUN 16 07/14/2019   NA 141 07/14/2019   K 4.2 07/14/2019   CL 105 07/14/2019   CO2 23 07/14/2019   Lab Results  Component Value Date   ALT 16 07/14/2019   AST 16 07/14/2019   ALKPHOS 76 07/14/2019   BILITOT 0.3 07/14/2019   Lab Results  Component Value Date   HGBA1C 5.4 07/14/2019   HGBA1C 5.2 03/24/2019   HGBA1C 5.3 12/17/2018   HGBA1C 5.5 08/26/2018   HGBA1C 5.7 (H) 05/08/2018   Lab Results  Component Value Date   INSULIN 4.5 07/14/2019   INSULIN 4.2 03/24/2019   INSULIN 5.6 12/17/2018   INSULIN 14.2 08/26/2018   INSULIN 3.5  05/08/2018   Lab Results  Component Value Date   TSH 0.89 08/26/2019   Lab Results  Component Value Date   CHOL 135 07/14/2019   HDL 52 07/14/2019   LDLCALC 73 07/14/2019   TRIG 40 07/14/2019   CHOLHDL 3 07/23/2017   Lab Results  Component Value Date   WBC 10.0 08/26/2019   HGB 14.5 08/26/2019   HCT 43.9 08/26/2019   MCV 91.9 08/26/2019   PLT 316.0 08/26/2019   Lab Results  Component Value Date    IRON 114 07/29/2013   FERRITIN 14.8 07/29/2013   Attestation Statements:   Reviewed by clinician on day of visit: allergies, medications, problem list, medical history, surgical history, family history, social history, and previous encounter notes.  Time spent on visit including pre-visit chart review and post-visit charting and care was 30 minutes.   IMichaelene Song, am acting as transcriptionist for Abby Potash, PA-C   I have reviewed the above documentation for accuracy and completeness, and I agree with the above. Abby Potash, PA-C

## 2019-11-06 ENCOUNTER — Other Ambulatory Visit: Payer: Self-pay

## 2019-11-13 ENCOUNTER — Telehealth: Payer: Self-pay

## 2019-11-13 NOTE — Telephone Encounter (Signed)
The letter has been written and sent to the pt via My Chart her preferred communication.

## 2019-11-13 NOTE — Telephone Encounter (Signed)
Patient is requesting for the letter to be stamped or signed by the doctor

## 2019-11-13 NOTE — Telephone Encounter (Signed)
Milus Banister, MD  Timothy Lasso, RN Izaiah Tabb,  I think everything she wrote is reasonable. Can you please incorporate this into a letter and I am happy to sign however I can do that. Thanks       Previous Messages   ----- Message -----  From: Timothy Lasso, RN  Sent: 11/12/2019  1:48 PM EDT  To: Milus Banister, MD  Subject: Need note for work                ----- Message from Timothy Lasso, RN sent at 11/12/2019 1:48 PM EDT -----    ----- Message from [name] to Milus Banister, MD sent at 11/12/2019 12:58 PM -----  Good Afternoon. 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 I have attached what I need to be included in the letter. If you agree, I also need it signed by June 28th. Thanks, [name].   [name] has Crohn's disease that requires IV infusions every 2 months. She will need the day off for the infusion and up to 2 days after each infusion to allow her immune system to recover. Please spare no expense for her PPE and allow her to wear a hair covering. 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 Please allow her to attend all meetings virtually unless an acceptable social distance is applicable.      Need note for work  Milus Banister, MD  You 1 hour ago (7:02 AM)   Chong Sicilian,  I think everything she wrote is reasonable. Can you please incorporate this into a letter and I am happy to sign however I can do that. Thanks   Message text   You routed conversation to Milus Banister, MD 18 hours ago (1:48 PM)  [name] Gillermina Hu, MD 19 hours ago (12:58 PM)   Good Afternoon.  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  I have attached what I need to be included in the letter.  If you agree, I also need it signed by June 28th.  Thanks, [name].  [name] has Crohn's disease that requires IV infusions every 2 months. She will need the day off for the infusion  and up to 2 days after each infusion to allow her immune system to recover. Please spare no expense for her PPE and allow her to wear a hair covering. 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  Please allow her to attend all meetings virtually unless an acceptable social distance is applicable.

## 2019-11-16 NOTE — Telephone Encounter (Signed)
Letter printed and stamped message sent to the pt that the letter is ready and I asked for a fax number to fax it for her.

## 2019-11-19 ENCOUNTER — Ambulatory Visit (INDEPENDENT_AMBULATORY_CARE_PROVIDER_SITE_OTHER): Payer: BC Managed Care – PPO | Admitting: Physician Assistant

## 2019-11-19 ENCOUNTER — Encounter (INDEPENDENT_AMBULATORY_CARE_PROVIDER_SITE_OTHER): Payer: Self-pay | Admitting: Physician Assistant

## 2019-11-19 ENCOUNTER — Other Ambulatory Visit: Payer: Self-pay

## 2019-11-19 VITALS — BP 114/76 | HR 77 | Temp 98.2°F | Ht 63.0 in | Wt 210.0 lb

## 2019-11-19 DIAGNOSIS — R7303 Prediabetes: Secondary | ICD-10-CM | POA: Diagnosis not present

## 2019-11-19 DIAGNOSIS — Z6837 Body mass index (BMI) 37.0-37.9, adult: Secondary | ICD-10-CM

## 2019-11-23 NOTE — Progress Notes (Signed)
Chief Complaint:   OBESITY [name] is here to discuss her progress with her obesity treatment plan along with follow-up of her obesity related diagnoses. [name] is keeping a food journal and adhering to recommended goals of 1500 calories and 95 grams of protein and states she is following her eating plan approximately 85% of the time. [name] states she is exercising 0 minutes 0 times per week.  Today's visit was #: 87 Starting weight: 293 lbs Starting date: 10/02/2017 Today's weight: 210 lbs Today's date: 11/19/2019 Total lbs lost to date: 83 Total lbs lost since last in-office visit: 2  Interim History: [name] had her fallopian tubes removed and did not eat completely on plan afterwards. She has been having sweet cravings. Low carb recipes were discussed today in detail.  Subjective:   Prediabetes. [name] has a diagnosis of prediabetes based on her elevated HgA1c and was informed this puts her at greater risk of developing diabetes. She continues to work on diet and exercise to decrease her risk of diabetes. She denies nausea or hypoglycemia. [name] is on metformin. No nausea, vomiting, or diarrhea.   Lab Results  Component Value Date   HGBA1C 5.4 07/14/2019   Lab Results  Component Value Date   INSULIN 4.5 07/14/2019   INSULIN 4.2 03/24/2019   INSULIN 5.6 12/17/2018   INSULIN 14.2 08/26/2018   INSULIN 3.5 05/08/2018   Assessment/Plan:   Prediabetes. [name] will continue to work on weight loss, exercise, and decreasing simple carbohydrates to help decrease the risk of diabetes. She will continue metformin as directed.  Class 2 severe obesity with serious comorbidity and body mass index (BMI) of 37.0 to 37.9 in adult, unspecified obesity type ([place]).   [name] is currently in the action stage of change. As such, her goal is to continue with weight loss efforts. She has agreed to keeping a food journal and adhering to recommended goals of 1500 calories and 95 grams  of protein daily.   Exercise goals: For substantial health benefits, adults should do at least 150 minutes (2 hours and 30 minutes) a week of moderate-intensity, or 75 minutes (1 hour and 15 minutes) a week of vigorous-intensity aerobic physical activity, or an equivalent combination of moderate- and vigorous-intensity aerobic activity. Aerobic activity should be performed in episodes of at least 10 minutes, and preferably, it should be spread throughout the week.  Behavioral modification strategies: meal planning and cooking strategies and keeping healthy foods in the home.  [name] has agreed to follow-up with our clinic in 2 weeks. She was informed of the importance of frequent follow-up visits to maximize her success with intensive lifestyle modifications for her multiple health conditions.   Objective:   Blood pressure 114/76, pulse 77, temperature 98.2 F (36.8 C), temperature source Oral, height 5' 3"  (1.6 m), weight 210 lb (95.3 kg), SpO2 95 %. Body mass index is 37.2 kg/m.  General: Cooperative, alert, well developed, in no acute distress. HEENT: Conjunctivae and lids unremarkable. Cardiovascular: Regular rhythm.  Lungs: Normal work of breathing. Neurologic: No focal deficits.   Lab Results  Component Value Date   CREATININE 0.77 07/14/2019   BUN 16 07/14/2019   NA 141 07/14/2019   K 4.2 07/14/2019   CL 105 07/14/2019   CO2 23 07/14/2019   Lab Results  Component Value Date   ALT 16 07/14/2019   AST 16 07/14/2019   ALKPHOS 76 07/14/2019   BILITOT 0.3 07/14/2019   Lab Results  Component Value Date  HGBA1C 5.4 07/14/2019   HGBA1C 5.2 03/24/2019   HGBA1C 5.3 12/17/2018   HGBA1C 5.5 08/26/2018   HGBA1C 5.7 (H) 05/08/2018   Lab Results  Component Value Date   INSULIN 4.5 07/14/2019   INSULIN 4.2 03/24/2019   INSULIN 5.6 12/17/2018   INSULIN 14.2 08/26/2018   INSULIN 3.5 05/08/2018   Lab Results  Component Value Date   TSH 0.89 08/26/2019   Lab Results    Component Value Date   CHOL 135 07/14/2019   HDL 52 07/14/2019   LDLCALC 73 07/14/2019   TRIG 40 07/14/2019   CHOLHDL 3 07/23/2017   Lab Results  Component Value Date   WBC 10.0 08/26/2019   HGB 14.5 08/26/2019   HCT 43.9 08/26/2019   MCV 91.9 08/26/2019   PLT 316.0 08/26/2019   Lab Results  Component Value Date   IRON 114 07/29/2013   FERRITIN 14.8 07/29/2013   Attestation Statements:   Reviewed by clinician on day of visit: allergies, medications, problem list, medical history, surgical history, family history, social history, and previous encounter notes.  Time spent on visit including pre-visit chart review and post-visit charting and care was 31 minutes.   IMichaelene Song, am acting as transcriptionist for Abby Potash, PA-C   I have reviewed the above documentation for accuracy and completeness, and I agree with the above. Abby Potash, PA-C

## 2019-12-02 ENCOUNTER — Encounter (INDEPENDENT_AMBULATORY_CARE_PROVIDER_SITE_OTHER): Payer: Self-pay | Admitting: Physician Assistant

## 2019-12-02 ENCOUNTER — Ambulatory Visit (INDEPENDENT_AMBULATORY_CARE_PROVIDER_SITE_OTHER): Payer: BC Managed Care – PPO | Admitting: Physician Assistant

## 2019-12-02 ENCOUNTER — Other Ambulatory Visit: Payer: Self-pay

## 2019-12-02 VITALS — BP 107/76 | HR 81 | Temp 98.4°F | Ht 63.0 in | Wt 209.0 lb

## 2019-12-02 DIAGNOSIS — Z6837 Body mass index (BMI) 37.0-37.9, adult: Secondary | ICD-10-CM | POA: Diagnosis not present

## 2019-12-02 DIAGNOSIS — E559 Vitamin D deficiency, unspecified: Secondary | ICD-10-CM

## 2019-12-03 NOTE — Progress Notes (Signed)
Chief Complaint:   OBESITY [name] is here to discuss her progress with her obesity treatment plan along with follow-up of her obesity related diagnoses. [name] is keeping a food journal and adhering to recommended goals of 1500 calories and 95 grams of protein and states she is following her eating plan approximately 90% of the time. [name] states she is doing aerobics/resistance training/walking 150 minutes per week.  Today's visit was #: 12 Starting weight: 293 lbs Starting date: 10/02/2017 Today's weight: 209 lbs Today's date: 12/02/2019 Total lbs lost to date: 84 Total lbs lost since last in-office visit: 1  Interim History: [name] reports that she got slightly off track with the holiday weekend. She reports averaging 85 grams of protein.  Subjective:   Vitamin D deficiency. Katlynne is on OTC Vitamin D daily. No nausea, vomiting, or muscle weakness.    Ref. Range 07/14/2019 08:45  Vitamin D, 25-Hydroxy Latest Ref Range: 30.0 - 100.0 ng/mL 53.7   Assessment/Plan:   Vitamin D deficiency. Low Vitamin D level contributes to fatigue and are associated with obesity, breast, and colon cancer. She agrees to continue to take Vitamin D as directed and will follow-up for routine testing of Vitamin D, at least 2-3 times per year to avoid over-replacement.  Class 2 severe obesity with serious comorbidity and body mass index (BMI) of 37.0 to 37.9 in adult, unspecified obesity type ([place]).  [name] is currently in the action stage of change. As such, her goal is to continue with weight loss efforts. She has agreed to keeping a food journal and adhering to recommended goals of 1500 calories and 95 protein.   Exercise goals: For substantial health benefits, adults should do at least 150 minutes (2 hours and 30 minutes) a week of moderate-intensity, or 75 minutes (1 hour and 15 minutes) a week of vigorous-intensity aerobic physical activity, or an equivalent combination of moderate- and  vigorous-intensity aerobic activity. Aerobic activity should be performed in episodes of at least 10 minutes, and preferably, it should be spread throughout the week.  Behavioral modification strategies: meal planning and cooking strategies and keeping healthy foods in the home.  [name] has agreed to follow-up with our clinic in 3 weeks. She was informed of the importance of frequent follow-up visits to maximize her success with intensive lifestyle modifications for her multiple health conditions.   Objective:   Blood pressure 107/76, pulse 81, temperature 98.4 F (36.9 C), temperature source Oral, height 5' 3"  (1.6 m), weight 209 lb (94.8 kg), SpO2 97 %. Body mass index is 37.02 kg/m.  General: Cooperative, alert, well developed, in no acute distress. HEENT: Conjunctivae and lids unremarkable. Cardiovascular: Regular rhythm.  Lungs: Normal work of breathing. Neurologic: No focal deficits.   Lab Results  Component Value Date   CREATININE 0.77 07/14/2019   BUN 16 07/14/2019   NA 141 07/14/2019   K 4.2 07/14/2019   CL 105 07/14/2019   CO2 23 07/14/2019   Lab Results  Component Value Date   ALT 16 07/14/2019   AST 16 07/14/2019   ALKPHOS 76 07/14/2019   BILITOT 0.3 07/14/2019   Lab Results  Component Value Date   HGBA1C 5.4 07/14/2019   HGBA1C 5.2 03/24/2019   HGBA1C 5.3 12/17/2018   HGBA1C 5.5 08/26/2018   HGBA1C 5.7 (H) 05/08/2018   Lab Results  Component Value Date   INSULIN 4.5 07/14/2019   INSULIN 4.2 03/24/2019   INSULIN 5.6 12/17/2018   INSULIN 14.2 08/26/2018  INSULIN 3.5 05/08/2018   Lab Results  Component Value Date   TSH 0.89 08/26/2019   Lab Results  Component Value Date   CHOL 135 07/14/2019   HDL 52 07/14/2019   LDLCALC 73 07/14/2019   TRIG 40 07/14/2019   CHOLHDL 3 07/23/2017   Lab Results  Component Value Date   WBC 10.0 08/26/2019   HGB 14.5 08/26/2019   HCT 43.9 08/26/2019   MCV 91.9 08/26/2019   PLT 316.0 08/26/2019   Lab Results   Component Value Date   IRON 114 07/29/2013   FERRITIN 14.8 07/29/2013   Attestation Statements:   Reviewed by clinician on day of visit: allergies, medications, problem list, medical history, surgical history, family history, social history, and previous encounter notes.  Time spent on visit including pre-visit chart review and post-visit charting and care was 30 minutes.   IMichaelene Song, am acting as transcriptionist for Abby Potash, PA-C   I have reviewed the above documentation for accuracy and completeness, and I agree with the above. Abby Potash, PA-C

## 2019-12-21 ENCOUNTER — Other Ambulatory Visit: Payer: Self-pay

## 2019-12-21 ENCOUNTER — Encounter (INDEPENDENT_AMBULATORY_CARE_PROVIDER_SITE_OTHER): Payer: Self-pay | Admitting: Physician Assistant

## 2019-12-21 ENCOUNTER — Ambulatory Visit (INDEPENDENT_AMBULATORY_CARE_PROVIDER_SITE_OTHER): Payer: BC Managed Care – PPO | Admitting: Physician Assistant

## 2019-12-21 VITALS — BP 95/62 | HR 83 | Temp 97.6°F | Ht 63.0 in | Wt 208.0 lb

## 2019-12-21 DIAGNOSIS — Z6836 Body mass index (BMI) 36.0-36.9, adult: Secondary | ICD-10-CM | POA: Diagnosis not present

## 2019-12-21 DIAGNOSIS — R7303 Prediabetes: Secondary | ICD-10-CM | POA: Diagnosis not present

## 2019-12-22 NOTE — Progress Notes (Signed)
Chief Complaint:   OBESITY [name] is here to discuss her progress with her obesity treatment plan along with follow-up of her obesity related diagnoses. [name] is keeping a food journal and adhering to recommended goals of 1500 calories and 95  grams of protein and states she is following her eating plan approximately 90% of the time. [name] states she is walking/fitness videos 60+ minutes 3 times per week.  Today's visit was #: 30 Starting weight: 293 lbs Starting date: 10/02/2017 Today's weight: 208 lbs Today's date: 12/21/2019 Total lbs lost to date: 85 Total lbs lost since last in-office visit: 1  Interim History: [name] reports that she has been more adventurous with her food choices and is enjoying it. She is going through fertility treatments currently.  Subjective:   Prediabetes. [name] has a diagnosis of prediabetes based on her elevated HgA1c and was informed this puts her at greater risk of developing diabetes. She continues to work on diet and exercise to decrease her risk of diabetes. No nausea, vomiting, or diarrhea. [name] is on metformin.  Lab Results  Component Value Date   HGBA1C 5.4 07/14/2019   Lab Results  Component Value Date   INSULIN 4.5 07/14/2019   INSULIN 4.2 03/24/2019   INSULIN 5.6 12/17/2018   INSULIN 14.2 08/26/2018   INSULIN 3.5 05/08/2018   Assessment/Plan:   Prediabetes. [name] will continue to work on weight loss, exercise, and decreasing simple carbohydrates to help decrease the risk of diabetes. She will continue her medication as directed.   Class 2 severe obesity with serious comorbidity and body mass index (BMI) of 36.0 to 36.9 in adult, unspecified obesity type ([place]).  [name] is currently in the action stage of change. As such, her goal is to continue with weight loss efforts. She has agreed to keeping a food journal and adhering to recommended goals of 1500 calories and 95 grams of protein.   Exercise goals: For  substantial health benefits, adults should do at least 150 minutes (2 hours and 30 minutes) a week of moderate-intensity, or 75 minutes (1 hour and 15 minutes) a week of vigorous-intensity aerobic physical activity, or an equivalent combination of moderate- and vigorous-intensity aerobic activity. Aerobic activity should be performed in episodes of at least 10 minutes, and preferably, it should be spread throughout the week.  Behavioral modification strategies: meal planning and cooking strategies and keeping healthy foods in the home.  [name] has agreed to follow-up with our clinic fasting in 3 weeks. She was informed of the importance of frequent follow-up visits to maximize her success with intensive lifestyle modifications for her multiple health conditions.   Objective:   Blood pressure (!) 95/62, pulse 83, temperature 97.6 F (36.4 C), temperature source Oral, height 5' 3"  (1.6 m), weight (!) 208 lb (94.3 kg), last menstrual period 11/29/2019, SpO2 95 %. Body mass index is 36.85 kg/m.  General: Cooperative, alert, well developed, in no acute distress. HEENT: Conjunctivae and lids unremarkable. Cardiovascular: Regular rhythm.  Lungs: Normal work of breathing. Neurologic: No focal deficits.   Lab Results  Component Value Date   CREATININE 0.77 07/14/2019   BUN 16 07/14/2019   NA 141 07/14/2019   K 4.2 07/14/2019   CL 105 07/14/2019   CO2 23 07/14/2019   Lab Results  Component Value Date   ALT 16 07/14/2019   AST 16 07/14/2019   ALKPHOS 76 07/14/2019   BILITOT 0.3 07/14/2019   Lab Results  Component Value Date  HGBA1C 5.4 07/14/2019   HGBA1C 5.2 03/24/2019   HGBA1C 5.3 12/17/2018   HGBA1C 5.5 08/26/2018   HGBA1C 5.7 (H) 05/08/2018   Lab Results  Component Value Date   INSULIN 4.5 07/14/2019   INSULIN 4.2 03/24/2019   INSULIN 5.6 12/17/2018   INSULIN 14.2 08/26/2018   INSULIN 3.5 05/08/2018   Lab Results  Component Value Date   TSH 0.89 08/26/2019   Lab  Results  Component Value Date   CHOL 135 07/14/2019   HDL 52 07/14/2019   LDLCALC 73 07/14/2019   TRIG 40 07/14/2019   CHOLHDL 3 07/23/2017   Lab Results  Component Value Date   WBC 10.0 08/26/2019   HGB 14.5 08/26/2019   HCT 43.9 08/26/2019   MCV 91.9 08/26/2019   PLT 316.0 08/26/2019   Lab Results  Component Value Date   IRON 114 07/29/2013   FERRITIN 14.8 07/29/2013   Attestation Statements:   Reviewed by clinician on day of visit: allergies, medications, problem list, medical history, surgical history, family history, social history, and previous encounter notes.  Time spent on visit including pre-visit chart review and post-visit charting and care was 31 minutes.   IMichaelene Song, am acting as transcriptionist for Abby Potash, PA-C   I have reviewed the above documentation for accuracy and completeness, and I agree with the above. Abby Potash, PA-C

## 2020-01-13 ENCOUNTER — Ambulatory Visit (INDEPENDENT_AMBULATORY_CARE_PROVIDER_SITE_OTHER): Payer: BC Managed Care – PPO | Admitting: Physician Assistant

## 2020-01-13 ENCOUNTER — Encounter (INDEPENDENT_AMBULATORY_CARE_PROVIDER_SITE_OTHER): Payer: Self-pay | Admitting: Physician Assistant

## 2020-01-13 ENCOUNTER — Other Ambulatory Visit: Payer: Self-pay

## 2020-01-13 VITALS — BP 102/67 | HR 76 | Temp 98.2°F | Ht 63.0 in | Wt 210.0 lb

## 2020-01-13 DIAGNOSIS — Z9189 Other specified personal risk factors, not elsewhere classified: Secondary | ICD-10-CM

## 2020-01-13 DIAGNOSIS — E559 Vitamin D deficiency, unspecified: Secondary | ICD-10-CM

## 2020-01-13 DIAGNOSIS — E7849 Other hyperlipidemia: Secondary | ICD-10-CM | POA: Diagnosis not present

## 2020-01-13 DIAGNOSIS — E66812 Obesity, class 2: Secondary | ICD-10-CM

## 2020-01-13 DIAGNOSIS — R7303 Prediabetes: Secondary | ICD-10-CM | POA: Diagnosis not present

## 2020-01-13 DIAGNOSIS — Z6837 Body mass index (BMI) 37.0-37.9, adult: Secondary | ICD-10-CM

## 2020-01-13 NOTE — Progress Notes (Signed)
Chief Complaint:   OBESITY [name] is here to discuss her progress with her obesity treatment plan along with follow-up of her obesity related diagnoses. [name] is keeping a food journal and adhering to recommended goals of 1500 calories and 95 grams of protein and states she is following her eating plan approximately 85% of the time. [name] states she is exercising 0 minutes 0 times per week.  Today's visit was #: 43 Starting weight: 293 lbs Starting date: 10/02/2017 Today's weight: 210 lbs Today's date: 01/13/2020 Total lbs lost to date: 83 Total lbs lost since last in-office visit: 0  Interim History: [name] reports that she has been averaging 1900 calories, but exceeding her protein goal. Kids come back to school next week and she will be back in a routine.  Subjective:   Vitamin D deficiency. [name] is on OTC Vitamin D supplementation. No nausea, vomiting, or muscle weakness.    Ref. Range 07/14/2019 08:45  Vitamin D, 25-Hydroxy Latest Ref Range: 30.0 - 100.0 ng/mL 53.7   Prediabetes. [name] has a diagnosis of prediabetes based on her elevated HgA1c and was informed this puts her at greater risk of developing diabetes. She continues to work on diet and exercise to decrease her risk of diabetes. She denies nausea or hypoglycemia. [name] is on metformin and denies polyphagia.  Lab Results  Component Value Date   HGBA1C 5.4 07/14/2019   Lab Results  Component Value Date   INSULIN 4.5 07/14/2019   INSULIN 4.2 03/24/2019   INSULIN 5.6 12/17/2018   INSULIN 14.2 08/26/2018   INSULIN 3.5 05/08/2018   Other hyperlipidemia. [name] has hyperlipidemia and has been trying to improve her cholesterol levels with intensive lifestyle modification including a low saturated fat diet, exercise and weight loss. She denies any chest pain. She is on no medication.  Lab Results  Component Value Date   ALT 16 07/14/2019   AST 16 07/14/2019   ALKPHOS 76 07/14/2019   BILITOT 0.3  07/14/2019   Lab Results  Component Value Date   CHOL 135 07/14/2019   HDL 52 07/14/2019   LDLCALC 73 07/14/2019   TRIG 40 07/14/2019   CHOLHDL 3 07/23/2017   At risk for diabetes mellitus. [name] is at higher than average risk for developing diabetes due to her obesity.   Assessment/Plan:   Vitamin D deficiency. Low Vitamin D level contributes to fatigue and are associated with obesity, breast, and colon cancer. She agrees to continue to take Vitamin D and VITAMIN D 25 Hydroxy (Vit-D Deficiency, Fractures) level will be checked today.  Prediabetes. [name] will continue to work on weight loss, exercise, and decreasing simple carbohydrates to help decrease the risk of diabetes. She will continue metformin as directed. Comprehensive metabolic panel, Hemoglobin A1c, Insulin, random labs will be checked today.   Other hyperlipidemia. Cardiovascular risk and specific lipid/LDL goals reviewed.  We discussed several lifestyle modifications today and [name] will continue to work on diet, exercise and weight loss efforts. Orders and follow up as documented in patient record.   Counseling Intensive lifestyle modifications are the first line treatment for this issue.  Dietary changes: Increase soluble fiber. Decrease simple carbohydrates.  Exercise changes: Moderate to vigorous-intensity aerobic activity 150 minutes per week if tolerated.  Lipid-lowering medications: see documented in medical record. Lipid panel  At risk for diabetes mellitus. [name] was given approximately 15 minutes of diabetes education and counseling today. We discussed intensive lifestyle modifications today with an emphasis on weight loss as  well as increasing exercise and decreasing simple carbohydrates in her diet. We also reviewed medication options with an emphasis on risk versus benefit of those discussed.   Repetitive spaced learning was employed today to elicit superior memory formation and behavioral  change.  Class 2 severe obesity with serious comorbidity and body mass index (BMI) of 37.0 to 37.9 in adult, unspecified obesity type ([name]).  [name] is currently in the action stage of change. As such, her goal is to continue with weight loss efforts. She has agreed to keeping a food journal and adhering to recommended goals of 1500 calories and 95 grams of protein daily.   Exercise goals: For substantial health benefits, adults should do at least 150 minutes (2 hours and 30 minutes) a week of moderate-intensity, or 75 minutes (1 hour and 15 minutes) a week of vigorous-intensity aerobic physical activity, or an equivalent combination of moderate- and vigorous-intensity aerobic activity. Aerobic activity should be performed in episodes of at least 10 minutes, and preferably, it should be spread throughout the week.  Behavioral modification strategies: meal planning and cooking strategies and keeping healthy foods in the home.  [name] has agreed to follow-up with our clinic in 2-3 weeks. She was informed of the importance of frequent follow-up visits to maximize her success with intensive lifestyle modifications for her multiple health conditions.   [name] was informed we would discuss her lab results at her next visit unless there is a critical issue that needs to be addressed sooner. [name] agreed to keep her next visit at the agreed upon time to discuss these results.  Objective:   Blood pressure 102/67, pulse 76, temperature 98.2 F (36.8 C), temperature source Oral, height 5' 3"  (1.6 m), weight 210 lb (95.3 kg), SpO2 96 %. Body mass index is 37.2 kg/m.  General: Cooperative, alert, well developed, in no acute distress. HEENT: Conjunctivae and lids unremarkable. Cardiovascular: Regular rhythm.  Lungs: Normal work of breathing. Neurologic: No focal deficits.   Lab Results  Component Value Date   CREATININE 0.77 07/14/2019   BUN 16 07/14/2019   NA 141 07/14/2019   K 4.2 07/14/2019    CL 105 07/14/2019   CO2 23 07/14/2019   Lab Results  Component Value Date   ALT 16 07/14/2019   AST 16 07/14/2019   ALKPHOS 76 07/14/2019   BILITOT 0.3 07/14/2019   Lab Results  Component Value Date   HGBA1C 5.4 07/14/2019   HGBA1C 5.2 03/24/2019   HGBA1C 5.3 12/17/2018   HGBA1C 5.5 08/26/2018   HGBA1C 5.7 (H) 05/08/2018   Lab Results  Component Value Date   INSULIN 4.5 07/14/2019   INSULIN 4.2 03/24/2019   INSULIN 5.6 12/17/2018   INSULIN 14.2 08/26/2018   INSULIN 3.5 05/08/2018   Lab Results  Component Value Date   TSH 0.89 08/26/2019   Lab Results  Component Value Date   CHOL 135 07/14/2019   HDL 52 07/14/2019   LDLCALC 73 07/14/2019   TRIG 40 07/14/2019   CHOLHDL 3 07/23/2017   Lab Results  Component Value Date   WBC 10.0 08/26/2019   HGB 14.5 08/26/2019   HCT 43.9 08/26/2019   MCV 91.9 08/26/2019   PLT 316.0 08/26/2019   Lab Results  Component Value Date   IRON 114 07/29/2013   FERRITIN 14.8 07/29/2013   Attestation Statements:   Reviewed by clinician on day of visit: allergies, medications, problem list, medical history, surgical history, family history, social history, and previous encounter notes.  Rudene Christians  Su Hoff, am acting as transcriptionist for Abby Potash, PA-C   I have reviewed the above documentation for accuracy and completeness, and I agree with the above. Abby Potash, PA-C

## 2020-01-14 LAB — COMPREHENSIVE METABOLIC PANEL
ALT: 13 IU/L (ref 0–32)
AST: 18 IU/L (ref 0–40)
Albumin/Globulin Ratio: 1 — ABNORMAL LOW (ref 1.2–2.2)
Albumin: 3.4 g/dL — ABNORMAL LOW (ref 3.8–4.8)
Alkaline Phosphatase: 69 IU/L (ref 48–121)
BUN/Creatinine Ratio: 15 (ref 9–23)
BUN: 10 mg/dL (ref 6–20)
Bilirubin Total: <0.2 mg/dL (ref 0.0–1.2)
CO2: 26 mmol/L (ref 20–29)
Calcium: 8.9 mg/dL (ref 8.7–10.2)
Chloride: 103 mmol/L (ref 96–106)
Creatinine, Ser: 0.68 mg/dL (ref 0.57–1.00)
GFR calc Af Amer: 130 mL/min/{1.73_m2} (ref >59)
GFR calc non Af Amer: 113 mL/min/{1.73_m2} (ref >59)
Globulin, Total: 3.5 g/dL (ref 1.5–4.5)
Glucose: 77 mg/dL (ref 65–99)
Potassium: 4.2 mmol/L (ref 3.5–5.2)
Sodium: 138 mmol/L (ref 134–144)
Total Protein: 6.9 g/dL (ref 6.0–8.5)

## 2020-01-14 LAB — HEMOGLOBIN A1C
Est. average glucose Bld gHb Est-mCnc: 105 mg/dL
Hgb A1c MFr Bld: 5.3 % (ref 4.8–5.6)

## 2020-01-14 LAB — LIPID PANEL
Chol/HDL Ratio: 2.8 ratio (ref 0.0–4.4)
Cholesterol, Total: 153 mg/dL (ref 100–199)
HDL: 55 mg/dL (ref >39)
LDL Chol Calc (NIH): 88 mg/dL (ref 0–99)
Triglycerides: 46 mg/dL (ref 0–149)
VLDL Cholesterol Cal: 10 mg/dL (ref 5–40)

## 2020-01-14 LAB — VITAMIN D 25 HYDROXY (VIT D DEFICIENCY, FRACTURES): Vit D, 25-Hydroxy: 84.1 ng/mL (ref 30.0–100.0)

## 2020-01-14 LAB — INSULIN, RANDOM: INSULIN: 5.2 u[IU]/mL (ref 2.6–24.9)

## 2020-02-08 ENCOUNTER — Ambulatory Visit (INDEPENDENT_AMBULATORY_CARE_PROVIDER_SITE_OTHER): Payer: BC Managed Care – PPO | Admitting: Physician Assistant

## 2020-02-08 ENCOUNTER — Encounter (INDEPENDENT_AMBULATORY_CARE_PROVIDER_SITE_OTHER): Payer: Self-pay | Admitting: Physician Assistant

## 2020-02-08 ENCOUNTER — Other Ambulatory Visit: Payer: Self-pay

## 2020-02-08 VITALS — BP 105/72 | HR 85 | Temp 98.2°F | Ht 63.0 in | Wt 209.0 lb

## 2020-02-08 DIAGNOSIS — R7303 Prediabetes: Secondary | ICD-10-CM | POA: Diagnosis not present

## 2020-02-08 DIAGNOSIS — E559 Vitamin D deficiency, unspecified: Secondary | ICD-10-CM | POA: Diagnosis not present

## 2020-02-08 DIAGNOSIS — Z9189 Other specified personal risk factors, not elsewhere classified: Secondary | ICD-10-CM | POA: Diagnosis not present

## 2020-02-08 DIAGNOSIS — E66812 Obesity, class 2: Secondary | ICD-10-CM

## 2020-02-08 DIAGNOSIS — Z6837 Body mass index (BMI) 37.0-37.9, adult: Secondary | ICD-10-CM | POA: Diagnosis not present

## 2020-02-08 NOTE — Progress Notes (Signed)
Chief Complaint:   OBESITY [name] is here to discuss her progress with her obesity treatment plan along with follow-up of her obesity related diagnoses. [name] is on keeping a food journal and adhering to recommended goals of 1500 calories and 95 grams of protein daily and states she is following her eating plan approximately 65% of the time. [name] states she is doing 0 minutes 0 times per week.  Today's visit was #: 66 Starting weight: 293 lbs Starting date: 10/02/2017 Today's weight: 209 lbs Today's date: 02/08/2020 Total lbs lost to date: 84 Total lbs lost since last in-office visit: 1  Interim History: [name] states that she has not been journaling consistently. She has been working late and then eating out. She is also having fertility treatments, which is the reason that she is on metformin currently.  Subjective:   1. Pre-diabetes [name]'s last A1c was at goal at 5.3. She is on metformin twice daily for fertility treatment.  2. Vitamin D deficiency [name] is on 4,000 units of Vit D daily. She denies nausea, vomiting, or muscle weakness.  3. 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  Assessment/Plan:   1. Pre-diabetes [name] will continue metformin, and will continue to work on weight loss, exercise, and decreasing simple carbohydrates to help decrease the risk of diabetes.   2. Vitamin D deficiency Low Vitamin D level contributes to fatigue and are associated with obesity, breast, and colon cancer. [name] agreed to decrease OTC Vit D to 2,000 IU daily and will follow-up for routine testing of Vitamin D, at least 2-3 times per year to avoid over-replacement.  3. At increased risk of exposure to COVID-19 virus [name] was given approximately 15 minutes of COVID prevention counseling today Counseling  COVID-19 is a respiratory infection that is caused by a virus. It can cause  serious infections, such as pneumonia, acute respiratory distress syndrome, acute respiratory failure, or sepsis.  You are more likely to develop a serious illness if you are 15 years of age or older, have a weak immune system, live in a nursing home, have chronic disease, or have obesity.  Get vaccinated as soon as they are available to you.  For our most current information, please visit [email or web address].  Wash your hands often with soap and water for 20 seconds. If soap and water are not available, use alcohol-based hand sanitizer.  Wear a face mask. Make sure your mask covers your nose and mouth.  Maintain at least 6 feet distance from others when in public.  Get help right away if  You have trouble breathing, chest pain, confusion, or other concerning symptoms.  Repetitive spaced learning was employed today to elicit superior memory formation and behavioral change.  4. Class 2 severe obesity with serious comorbidity and body mass index (BMI) of 37.0 to 37.9 in adult, unspecified obesity type ([facility]) [name] is currently in the action stage of change. As such, her goal is to continue with weight loss efforts. She has agreed to keeping a food journal and adhering to recommended goals of 1500 calories and 95 grams of protein daily.   Exercise goals: No exercise has been prescribed at this time.  Behavioral modification strategies: meal planning and cooking strategies and keeping healthy foods in the home.  [name] has agreed to follow-up with our clinic in 3 weeks. She was informed of the importance of frequent follow-up visits to  maximize her success with intensive lifestyle modifications for her multiple health conditions.   Objective:   Blood pressure 105/72, pulse 85, temperature 98.2 F (36.8 C), temperature source Oral, height 5' 3"  (1.6 m), weight 209 lb (94.8 kg), SpO2 100 %. Body mass index is 37.02 kg/m.  General: Cooperative, alert, well developed, in no acute  distress. HEENT: Conjunctivae and lids unremarkable. Cardiovascular: Regular rhythm.  Lungs: Normal work of breathing. Neurologic: No focal deficits.   Lab Results  Component Value Date   CREATININE 0.68 01/13/2020   BUN 10 01/13/2020   NA 138 01/13/2020   K 4.2 01/13/2020   CL 103 01/13/2020   CO2 26 01/13/2020   Lab Results  Component Value Date   ALT 13 01/13/2020   AST 18 01/13/2020   ALKPHOS 69 01/13/2020   BILITOT <0.2 01/13/2020   Lab Results  Component Value Date   HGBA1C 5.3 01/13/2020   HGBA1C 5.4 07/14/2019   HGBA1C 5.2 03/24/2019   HGBA1C 5.3 12/17/2018   HGBA1C 5.5 08/26/2018   Lab Results  Component Value Date   INSULIN 5.2 01/13/2020   INSULIN 4.5 07/14/2019   INSULIN 4.2 03/24/2019   INSULIN 5.6 12/17/2018   INSULIN 14.2 08/26/2018   Lab Results  Component Value Date   TSH 0.89 08/26/2019   Lab Results  Component Value Date   CHOL 153 01/13/2020   HDL 55 01/13/2020   LDLCALC 88 01/13/2020   TRIG 46 01/13/2020   CHOLHDL 2.8 01/13/2020   Lab Results  Component Value Date   WBC 10.0 08/26/2019   HGB 14.5 08/26/2019   HCT 43.9 08/26/2019   MCV 91.9 08/26/2019   PLT 316.0 08/26/2019   Lab Results  Component Value Date   IRON 114 07/29/2013   FERRITIN 14.8 07/29/2013   Attestation Statements:   Reviewed by clinician on day of visit: allergies, medications, problem list, medical history, surgical history, family history, social history, and previous encounter notes.   Wilhemena Durie, am acting as transcriptionist for [employer], PA-C.  I have reviewed the above documentation for accuracy and completeness, and I agree with the above. Abby Potash, PA-C

## 2020-02-24 ENCOUNTER — Encounter (INDEPENDENT_AMBULATORY_CARE_PROVIDER_SITE_OTHER): Payer: Self-pay

## 2020-02-29 ENCOUNTER — Ambulatory Visit (INDEPENDENT_AMBULATORY_CARE_PROVIDER_SITE_OTHER): Payer: BC Managed Care – PPO | Admitting: Physician Assistant

## 2020-03-08 ENCOUNTER — Ambulatory Visit (INDEPENDENT_AMBULATORY_CARE_PROVIDER_SITE_OTHER): Payer: BC Managed Care – PPO | Admitting: Bariatrics

## 2020-03-28 ENCOUNTER — Ambulatory Visit: Payer: BC Managed Care – PPO | Admitting: Gastroenterology

## 2020-03-28 ENCOUNTER — Encounter: Payer: Self-pay | Admitting: Gastroenterology

## 2020-03-28 ENCOUNTER — Other Ambulatory Visit (INDEPENDENT_AMBULATORY_CARE_PROVIDER_SITE_OTHER): Payer: BC Managed Care – PPO

## 2020-03-28 DIAGNOSIS — R197 Diarrhea, unspecified: Secondary | ICD-10-CM

## 2020-03-28 DIAGNOSIS — K5090 Crohn's disease, unspecified, without complications: Secondary | ICD-10-CM | POA: Diagnosis not present

## 2020-03-28 LAB — CBC
HCT: 36.5 % (ref 36.0–46.0)
Hemoglobin: 12.3 g/dL (ref 12.0–15.0)
MCHC: 33.6 g/dL (ref 30.0–36.0)
MCV: 86.7 fl (ref 78.0–100.0)
Platelets: 370 10*3/uL (ref 150.0–400.0)
RBC: 4.21 Mil/uL (ref 3.87–5.11)
RDW: 13.8 % (ref 11.5–15.5)
WBC: 9.6 10*3/uL (ref 4.0–10.5)

## 2020-03-28 LAB — COMPREHENSIVE METABOLIC PANEL
ALT: 11 U/L (ref 0–35)
AST: 13 U/L (ref 0–37)
Albumin: 3.2 g/dL — ABNORMAL LOW (ref 3.5–5.2)
Alkaline Phosphatase: 60 U/L (ref 39–117)
BUN: 13 mg/dL (ref 6–23)
CO2: 26 mEq/L (ref 19–32)
Calcium: 8.5 mg/dL (ref 8.4–10.5)
Chloride: 102 mEq/L (ref 96–112)
Creatinine, Ser: 0.71 mg/dL (ref 0.40–1.20)
GFR: 108.97 mL/min (ref >60.00)
Glucose, Bld: 77 mg/dL (ref 70–99)
Potassium: 3.9 mEq/L (ref 3.5–5.1)
Sodium: 135 mEq/L (ref 135–145)
Total Bilirubin: 0.2 mg/dL (ref 0.2–1.2)
Total Protein: 7.2 g/dL (ref 6.0–8.3)

## 2020-03-28 LAB — SEDIMENTATION RATE: Sed Rate: 55 mm/hr — ABNORMAL HIGH (ref 0–20)

## 2020-03-28 NOTE — Progress Notes (Signed)
Review of gastrointestinal problems:  1. Crohn ileocolitis.Presented like an acute appendicitis 2006. Surgery, not in [place], was much more suggestive of Crohn's disease. 02/2005 colonoscopy Dr. Harrold [name] to cecum inflammation (erosions, granularity, TI nodular, friable, ulcerated. Path showed chronic, active TI inflammation; normal colon biopsies. She did not respond to immunomodulators and so eventually she Began Remicade April 2007 While continuing on azathioprine. Early 2008, stopped azathioprine and she tolerated solo maintenance Remicade (5 mg per kilogram) well. April 2009 continues to tolerate Remicade infusions very well, getting her infusions at Dr. [name] office. 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 Her Remicade dosing was increased to 10 mg per kilogram. Winter, 2010 presented with right lower quadrant abscess, clear fistula connection between small bowel and the abscess. 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 Infection was drained with interventional radiology, drain eventually pulled however abscess recurred within days and so right lower quadrant drain was replaced. January, 2011: she is on azathioprine 100 mg a day, Augmentin twice daily, planning for surgery late January. January, 2011: ileocecectomy performed for active disease, persistent fistula, abscess. April, 2012 doing well on Remicade every 8 weeks.  10/2012 doing well. 01/2016 doing well on remicade 57m/kg every 8 weeks Dr. BMelissa Noonoffice.Colonoscopy Dr. JArdis Hughs10/2018 showednormal, widely patent ileocecal anastomosis. A few small erosions were noted just proximal to the IC anastomosis. The colon was normal. Pathology showed mild colonic inflammation in the small bowel only. Colon biopsies were normal.  TB quant gold October 2019 was negative 2. May, 2011: polynidol  cystdrained by urgent care; drained another time 2011. eventually surgically resected spring 2012 3. Morbid obesity; BMI 49 2017; BMI 53 2018BMI 47 2019;  4.  July 2020 open repair of ventral incisional hernia, also primary repair of left split galea hernia Dr. GHarlow [name]  HPI: This is a very pleasant 39year old woman I last saw about 1 year ago  She has been on Remicade 10 mg/kg IV infusion every 8 weeks for many years now.  Blood work August 27902complete metabolic profile was normal  This appointment was scheduled as a routine follow-up however for the past 5 days she has had significant change in her bowels.  She has been having nonbloody urgent loose stools.  1 explosive loose stool on Thursday then her bowels seem to normalize and then yesterday she had 4 loose stools and 2 already today on Monday.  She has had no sick contacts.  She has had no changes in her medicines.  She has been on no antibiotics in the past several months.  She does not have any abdominal pains, no fever no chills.  She is in the process of in vitro fertilization.  She is on several medicines for this including Metformin.  None of those medicines have been changed in several months.   ROS: complete GI ROS as described in HPI, all other review negative.  Constitutional:  No unintentional weight loss   Past Medical History:  Diagnosis Date   Abrasion of upper arm with infection, left, sequela    started antibiotics lef arm oct 4th started antibiotics healing well   Anemia    hx of 2 yrs ago   Chicken pox    Chronic rhinitis    Contact dermatitis and other eczema, due to unspecified cause    Crohn's 11-15-11   no issues in 2 yrs   Fibroids 11-15-11  remains with fibroids   Hernia    Incisional hernia, RLQ 10/02/2011   Infertility, female    Mononucleosis    Obesity    Pilonidal abscess    Rectal abscess    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[place])    Vitamin D deficiency      Past Surgical History:  Procedure Laterality Date   APPENDECTOMY  7/06   COLONOSCOPY WITH PROPOFOL N/A 03/07/2017   Procedure: COLONOSCOPY WITH PROPOFOL;  Surgeon: Donna Banister, MD;  Location: [facility];  Service: Endoscopy;  Laterality: N/A;   EYE SURGERY  11-15-11   2003-multiple stys   ileocecal resection  1/11   crohns disease with fisula/stricture. Dr. Ronnald [name]   INCISIONAL HERNIA REPAIR N/A 12/24/2018   Procedure: OPEN REPAIR VENTRAL INCISIONAL HERNIA WITH MESH PATCH AND PRIMARY REPAIR OF LEFT SPIGELIAN HERNIA;  Surgeon: Donna Gemma, MD;  Location: [facility];  Service: General;  Laterality: N/A;   KNEE ARTHROSCOPY  99, 02   Bil.   MYOMECTOMY N/A 11/10/2014   Procedure: MYOMECTOMY;  Surgeon: Donna Graff, MD;  Location: [facility];  Service: Gynecology;  Laterality: N/A;   PILONIDAL CYST EXCISION  2011, 2012   I&Ds   VENTRAL HERNIA REPAIR  11/27/2011   Procedure: LAPAROSCOPIC VENTRAL HERNIA;  Surgeon: Donna Hector, MD;  Location: [facility];  Service: General;  Laterality: N/A;  Laparoscopic Exploration & Repair of Hernia in Abdomen   WISDOM TOOTH EXTRACTION      Current Outpatient Medications  Medication Sig Dispense Refill   aspirin EC 81 MG tablet Take 81 mg by mouth daily. Swallow whole.     Calcium Carb-Cholecalciferol (CALCIUM 600+D3 PO) Take 1 tablet by mouth daily.     estradiol (VIVELLE-DOT) 0.025 MG/24HR Place 1 patch onto the skin 2 (two) times a week.     estrogens, conjugated, (PREMARIN) 0.3 MG tablet Take 0.3 mg by mouth daily. Take daily for 21 days then do not take for 7 days.     fexofenadine (ALLEGRA) 180 MG tablet Take 180 mg by mouth daily.     fluticasone (FLONASE) 50 MCG/ACT nasal spray Place 1 spray into both nostrils daily.     InFLIXimab (REMICADE IV) Inject into the vein every 8 (eight) weeks.      metFORMIN (GLUCOPHAGE) 500 MG tablet Take by mouth 2 (two) times daily with a meal.     Prenatal Vit-Fe Fumarate-FA (PRENATAL  MULTIVITAMIN) TABS tablet Take 1 tablet by mouth daily at 12 [name].     Vitamin D, Cholecalciferol, 50 MCG (2000 UT) CAPS Take 4,000 Units by mouth daily.     No current facility-administered medications for this visit.    Allergies as of 03/28/2020   (No Known Allergies)    Family History  Problem Relation Age of Onset   Aneurysm Mother        brain   Heart disease Mother 21       heart anyrism   Healthy Father        fairly   Diabetes Sister        half sister   Bipolar disorder Sister        half sister   Colon cancer Maternal Grandmother    Stomach cancer Paternal Grandfather    Multiple sclerosis Sister     Social History   Socioeconomic History   Marital status: Married    Spouse name: Pritika Alvarez   Number of children: Not on file   Years of education: Not on  file   Highest education level: Not on file  Occupational History   Occupation: Music therapist  Tobacco Use   Smoking status: Never Smoker   Smokeless tobacco: Never Used  Vaping Use   Vaping Use: Never used  Substance and Sexual Activity   Alcohol use: No   Drug use: No   Sexual activity: Yes    Partners: Male  Other Topics Concern   Not on file  Social History Narrative   married   HH of 2   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    No pets   Sleep 6-7 hours    BF with 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   Walking exercise   Eats balanced generally   Social Determinants of Health   Financial Resource Strain:    Difficulty of Paying Living Expenses: Not on file  Food Insecurity:    Worried About Charity fundraiser in the Last Year: Not on file   [employer] of Food in the Last Year: Not on file  Transportation Needs:    Lack of Transportation (Medical): Not on file   Lack of Transportation (Non-Medical): Not on file  Physical Activity:    Days of Exercise per Week: Not on file   Minutes of Exercise per Session: Not on file  Stress:    Feeling of  Stress : Not on file  Social Connections:    Frequency of Communication with Friends and Family: Not on file   Frequency of Social Gatherings with Friends and Family: Not on file   Attends Religious Services: Not on file   Active Member of Clubs or Organizations: Not on file   Attends Archivist Meetings: Not on file   Marital Status: Not on file  Intimate Partner Violence:    Fear of Current or Ex-Partner: Not on file   Emotionally Abused: Not on file   Physically Abused: Not on file   Sexually Abused: Not on file     Physical Exam: BP 122/74 (BP Location: Left Arm, Patient Position: Sitting)    Pulse 90    Ht 5' 3"  (1.6 m)    Wt 218 lb 3.2 oz (99 kg)    SpO2 98%    BMI 38.65 kg/m  Constitutional: generally well-appearing Psychiatric: alert and oriented x3 Abdomen: soft, nontender, nondistended, no obvious ascites, no peritoneal signs, normal bowel sounds No peripheral edema noted in lower extremities  Assessment and plan: 39 y.o. female with Crohn's ileocolitis, new diarrheal illness  I am not sure if this represents a flare of her Crohn's disease or is an infectious colitis or an opportunistic type infection with her immune suppression.  She is also admittedly under a lot of stress at work and home but this seems a bit extreme for IBS related diarrhea.  She has never really had that either.  She is going to have blood test and stool test today including a GI pathogen panel, CBC, complete metabolic profile, sedimentation rate and TB quant gold test.  I recommended over-the-counter Imodium 1 pill twice daily as needed for now.  If the tests above are not helpful and she continues to have significant diarrhea then she understands that she will probably need further testing.  That may include repeat colonoscopy or perhaps noninvasive imaging such as MRI enterography to try to establish whether she has a flare of her Crohn's.  If I do think she has a Crohn's flare going  on that I would  likely recommend starting steroids briefly, and in that case I would probably want to discuss her case with her fertility physician since she is in the middle of in vitro fertilization pathway.  Please see the "Patient Instructions" section for addition details about the plan.  Donna Loffler, MD [facility] 03/28/2020, 3:51 PM   Total time on date of encounter was 30 minutes (this included time spent preparing to see the patient reviewing records; obtaining and/or reviewing separately obtained history; performing a medically appropriate exam and/or evaluation; counseling and educating the patient and family if present; ordering medications, tests or procedures if applicable; and documenting clinical information in the health record).

## 2020-03-28 NOTE — Patient Instructions (Signed)
If you are age 37 or older, your body mass index should be between 23-30. Your Body mass index is 38.65 kg/m. If this is out of the aforementioned range listed, please consider follow up with your Primary Care Provider.  If you are age 37 or younger, your body mass index should be between 19-25. Your Body mass index is 38.65 kg/m. If this is out of the aformentioned range listed, please consider follow up with your Primary Care Provider.   Your provider has requested that you go to the basement level for lab work before leaving today. Press "B" on the elevator. The lab is located at the first door on the left as you exit the elevator.  Due to recent changes in healthcare laws, you may see the results of your imaging and laboratory studies on MyChart before your provider has had a chance to review them.  We understand that in some cases there may be results that are confusing or concerning to you. Not all laboratory results come back in the same time frame and the provider may be waiting for multiple results in order to interpret others.  Please give [name] 48 hours in order for your provider to thoroughly review all the results before contacting the office for clarification of your results.   Please purchase the following medications over the counter and take as directed:  START: Imodium over-the-counter one tablet twice daily as needed.   Please call our office in one week to report how you are feeling.  Thank you for entrusting me with your care and choosing [facility].  Dr Ardis Hughs

## 2020-03-29 ENCOUNTER — Ambulatory Visit (INDEPENDENT_AMBULATORY_CARE_PROVIDER_SITE_OTHER): Payer: BC Managed Care – PPO | Admitting: Physician Assistant

## 2020-03-29 ENCOUNTER — Other Ambulatory Visit: Payer: Self-pay

## 2020-03-29 ENCOUNTER — Encounter (INDEPENDENT_AMBULATORY_CARE_PROVIDER_SITE_OTHER): Payer: Self-pay | Admitting: Physician Assistant

## 2020-03-29 VITALS — BP 107/68 | HR 78 | Temp 97.7°F | Ht 63.0 in | Wt 212.0 lb

## 2020-03-29 DIAGNOSIS — Z6837 Body mass index (BMI) 37.0-37.9, adult: Secondary | ICD-10-CM

## 2020-03-29 DIAGNOSIS — E559 Vitamin D deficiency, unspecified: Secondary | ICD-10-CM | POA: Diagnosis not present

## 2020-03-29 DIAGNOSIS — K50919 Crohn's disease, unspecified, with unspecified complications: Secondary | ICD-10-CM

## 2020-03-29 NOTE — Progress Notes (Signed)
Chief Complaint:   OBESITY [name] is here to discuss her progress with her obesity treatment plan along with follow-up of her obesity related diagnoses. [name] is keeping a food journal and adhering to recommended goals of 1500 calories and 95 grams of protein and states she is following her eating plan approximately 75% of the time. [name] states she is exercising 0 minutes 0 times per week.  Today's visit was #: 37 Starting weight: 293 lbs Starting date: 10/02/2017 Today's weight: 212 lbs Today's date: 03/29/2020 Total lbs lost to date: 81 Total lbs lost since last in-office visit: 0  Interim History: [name] has been very stressed with work. Her sugar cravings have increased and she has given in to them. She is getting home home later and eating out more often. She is not sleeping well.  Subjective:   Vitamin D deficiency. [name] is on OTC Vitamin D daily. No nausea, vomiting, or muscle weakness. She will be due for labs in the next 1-2 months.   Ref. Range 01/13/2020 15:51  Vitamin D, 25-Hydroxy Latest Ref Range: 30.0 - 100.0 ng/mL 84.1   Crohn's disease with complication, unspecified gastrointestinal tract location [facility]). [name] saw Dr. Ardis Hughs of GI yesterday for workup due to diarrhea. She is on an antidiarrheal currently.  Assessment/Plan:   Vitamin D deficiency. Low Vitamin D level contributes to fatigue and are associated with obesity, breast, and colon cancer. She agrees to continue to take Vitamin D as directed and will follow-up for routine testing of Vitamin D, at least 2-3 times per year to avoid over-replacement.  Crohn's disease with complication, unspecified gastrointestinal tract location [facility]). [name] will follow-up with Dr. Ardis Hughs as scheduled.  Class 2 severe obesity with serious comorbidity and body mass index (BMI) of 37.0 to 37.9 in adult, unspecified obesity type ([place]).  [name] is currently in the action stage of change. As such, her goal is  to continue with weight loss efforts. She has agreed to keeping a food journal and adhering to recommended goals of 1500 calories and 95 grams of protein daily.   Exercise goals: For substantial health benefits, adults should do at least 150 minutes (2 hours and 30 minutes) a week of moderate-intensity, or 75 minutes (1 hour and 15 minutes) a week of vigorous-intensity aerobic physical activity, or an equivalent combination of moderate- and vigorous-intensity aerobic activity. Aerobic activity should be performed in episodes of at least 10 minutes, and preferably, it should be spread throughout the week.  Behavioral modification strategies: decreasing simple carbohydrates and decreasing eating out.  [name] has agreed to follow-up with our clinic in 2-3 weeks. She was informed of the importance of frequent follow-up visits to maximize her success with intensive lifestyle modifications for her multiple health conditions.   Objective:   Blood pressure 107/68, pulse 78, temperature 97.7 F (36.5 C), height 5' 3"  (1.6 m), weight 212 lb (96.2 kg), SpO2 97 %. Body mass index is 37.55 kg/m.  General: Cooperative, alert, well developed, in no acute distress. HEENT: Conjunctivae and lids unremarkable. Cardiovascular: Regular rhythm.  Lungs: Normal work of breathing. Neurologic: No focal deficits.   Lab Results  Component Value Date   CREATININE 0.71 03/28/2020   BUN 13 03/28/2020   NA 135 03/28/2020   K 3.9 03/28/2020   CL 102 03/28/2020   CO2 26 03/28/2020   Lab Results  Component Value Date   ALT 11 03/28/2020   AST 13 03/28/2020   ALKPHOS 60 03/28/2020  BILITOT 0.2 03/28/2020   Lab Results  Component Value Date   HGBA1C 5.3 01/13/2020   HGBA1C 5.4 07/14/2019   HGBA1C 5.2 03/24/2019   HGBA1C 5.3 12/17/2018   HGBA1C 5.5 08/26/2018   Lab Results  Component Value Date   INSULIN 5.2 01/13/2020   INSULIN 4.5 07/14/2019   INSULIN 4.2 03/24/2019   INSULIN 5.6 12/17/2018   INSULIN  14.2 08/26/2018   Lab Results  Component Value Date   TSH 0.89 08/26/2019   Lab Results  Component Value Date   CHOL 153 01/13/2020   HDL 55 01/13/2020   LDLCALC 88 01/13/2020   TRIG 46 01/13/2020   CHOLHDL 2.8 01/13/2020   Lab Results  Component Value Date   WBC 9.6 03/28/2020   HGB 12.3 03/28/2020   HCT 36.5 03/28/2020   MCV 86.7 03/28/2020   PLT 370.0 03/28/2020   Lab Results  Component Value Date   IRON 114 07/29/2013   FERRITIN 14.8 07/29/2013   Attestation Statements:   Reviewed by clinician on day of visit: allergies, medications, problem list, medical history, surgical history, family history, social history, and previous encounter notes.  Time spent on visit including pre-visit chart review and post-visit charting and care was 30 minutes.   IMichaelene Song, am acting as transcriptionist for Abby Potash, PA-C   I have reviewed the above documentation for accuracy and completeness, and I agree with the above. Abby Potash, PA-C

## 2020-03-30 ENCOUNTER — Telehealth: Payer: Self-pay | Admitting: Gastroenterology

## 2020-03-30 LAB — QUANTIFERON-TB GOLD PLUS
Mitogen-NIL: >10 IU/mL
NIL: 0.03 IU/mL
QuantiFERON-TB Gold Plus: NEGATIVE
TB1-NIL: 0 IU/mL
TB2-NIL: 0.01 IU/mL

## 2020-03-30 NOTE — Telephone Encounter (Signed)
Patient called states she will be having Remecade medication on Friday 04/01/2020 and IBS Embryo transfer on Monday needs to know if that is ok for her to do please advise

## 2020-03-30 NOTE — Telephone Encounter (Signed)
Dr Ardis Hughs is this ok? See pt note

## 2020-03-31 NOTE — Telephone Encounter (Signed)
It is OK from a Crohn's perspective.  She should make sure that her fertility team knows about the time more importantly

## 2020-03-31 NOTE — Telephone Encounter (Signed)
The pt has been advised of Dr Ardis Hughs recommendations.  She will notify the fertility team as well.

## 2020-04-12 ENCOUNTER — Other Ambulatory Visit: Payer: Self-pay

## 2020-04-12 ENCOUNTER — Telehealth (INDEPENDENT_AMBULATORY_CARE_PROVIDER_SITE_OTHER): Payer: BC Managed Care – PPO | Admitting: Physician Assistant

## 2020-04-12 DIAGNOSIS — E66812 Obesity, class 2: Secondary | ICD-10-CM

## 2020-04-12 DIAGNOSIS — Z6837 Body mass index (BMI) 37.0-37.9, adult: Secondary | ICD-10-CM | POA: Diagnosis not present

## 2020-04-12 DIAGNOSIS — E559 Vitamin D deficiency, unspecified: Secondary | ICD-10-CM | POA: Diagnosis not present

## 2020-04-12 DIAGNOSIS — R7303 Prediabetes: Secondary | ICD-10-CM

## 2020-04-13 ENCOUNTER — Other Ambulatory Visit: Payer: BC Managed Care – PPO

## 2020-04-13 DIAGNOSIS — R197 Diarrhea, unspecified: Secondary | ICD-10-CM

## 2020-04-13 DIAGNOSIS — K5090 Crohn's disease, unspecified, without complications: Secondary | ICD-10-CM

## 2020-04-17 LAB — GI PROFILE, STOOL, PCR
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
## 2020-05-10 ENCOUNTER — Ambulatory Visit (INDEPENDENT_AMBULATORY_CARE_PROVIDER_SITE_OTHER): Payer: BC Managed Care – PPO | Admitting: Physician Assistant

## 2020-05-10 ENCOUNTER — Other Ambulatory Visit: Payer: Self-pay

## 2020-05-10 ENCOUNTER — Encounter (INDEPENDENT_AMBULATORY_CARE_PROVIDER_SITE_OTHER): Payer: Self-pay | Admitting: Physician Assistant

## 2020-05-10 VITALS — BP 104/68 | HR 86 | Temp 98.3°F | Ht 63.0 in | Wt 207.0 lb

## 2020-05-10 DIAGNOSIS — R7303 Prediabetes: Secondary | ICD-10-CM

## 2020-05-10 DIAGNOSIS — E559 Vitamin D deficiency, unspecified: Secondary | ICD-10-CM

## 2020-05-10 DIAGNOSIS — Z6836 Body mass index (BMI) 36.0-36.9, adult: Secondary | ICD-10-CM

## 2020-05-10 NOTE — Progress Notes (Signed)
Chief Complaint:   OBESITY [name] is here to discuss her progress with her obesity treatment plan along with follow-up of her obesity related diagnoses. [name] is keeping a food journal and adhering to recommended goals of 1500-1700 calories and 95 grams of protein and states she is following her eating plan approximately 95% of the time. [name] states she is exercising 0 minutes 0 times per week.  Today's visit was #: 30 Starting weight: 293 lbs Starting date: 10/02/2017 Today's weight: 207 lbs Today's date: 05/10/2020 Total lbs lost to date: 62 Total lbs lost since last in-office visit: 5  Interim History: [name] recently found out that she is [redacted] weeks pregnant. The last 2 weeks she has had some nausea and food aversion. She will see her OB for the first time next week.  Subjective:   Vitamin D deficiency. Last Vitamin D level was at goal. [name] is taking OTC Vitamin D supplementation.   Ref. Range 01/13/2020 15:51  Vitamin D, 25-Hydroxy Latest Ref Range: 30.0 - 100.0 ng/mL 84.1   Pre-diabetes. [name] has a diagnosis of prediabetes based on her elevated HgA1c and was informed this puts her at greater risk of developing diabetes. She continues to work on diet and exercise to decrease her risk of diabetes. She denies nausea or hypoglycemia. Last A1c 5.3. [name] is on metformin. She is [redacted] weeks pregnant.  Lab Results  Component Value Date   HGBA1C 5.3 01/13/2020   Lab Results  Component Value Date   INSULIN 5.2 01/13/2020   INSULIN 4.5 07/14/2019   INSULIN 4.2 03/24/2019   INSULIN 5.6 12/17/2018   INSULIN 14.2 08/26/2018   Assessment/Plan:   Vitamin D deficiency. Low Vitamin D level contributes to fatigue and are associated with obesity, breast, and colon cancer. She was advised to speak with her OB regarding whether or not to continue Vitamin D since she is [redacted] weeks pregnant.  Pre-diabetes. [name] will continue to work on weight loss, exercise, and decreasing  simple carbohydrates to help decrease the risk of diabetes. She will follow-up with her OB as scheduled.  Class 2 severe obesity with serious comorbidity and body mass index (BMI) of 36.0 to 36.9 in adult, unspecified obesity type ([name]).  [name] is currently in the action stage of change. As such, her goal is to continue with weight loss efforts. She has agreed to keeping a food journal and adhering to recommended goals of 1500-1700 calories and 95 grams of protein daily.   Exercise goals: For substantial health benefits, adults should do at least 150 minutes (2 hours and 30 minutes) a week of moderate-intensity, or 75 minutes (1 hour and 15 minutes) a week of vigorous-intensity aerobic physical activity, or an equivalent combination of moderate- and vigorous-intensity aerobic activity. Aerobic activity should be performed in episodes of at least 10 minutes, and preferably, it should be spread throughout the week.  Behavioral modification strategies: meal planning and cooking strategies and keeping healthy foods in the home.  [name] has agreed to follow-up with our clinic in 4 weeks. She was informed of the importance of frequent follow-up visits to maximize her success with intensive lifestyle modifications for her multiple health conditions.   Objective:   Blood pressure 104/68, pulse 86, temperature 98.3 F (36.8 C), height 5' 3"  (1.6 m), weight 207 lb (93.9 kg), SpO2 99 %. Body mass index is 36.67 kg/m.  General: Cooperative, alert, well developed, in no acute distress. HEENT: Conjunctivae and lids unremarkable. Cardiovascular: Regular rhythm.  Lungs: Normal work of breathing. Neurologic: No focal deficits.   Lab Results  Component Value Date   CREATININE 0.71 03/28/2020   BUN 13 03/28/2020   NA 135 03/28/2020   K 3.9 03/28/2020   CL 102 03/28/2020   CO2 26 03/28/2020   Lab Results  Component Value Date   ALT 11 03/28/2020   AST 13 03/28/2020   ALKPHOS 60 03/28/2020    BILITOT 0.2 03/28/2020   Lab Results  Component Value Date   HGBA1C 5.3 01/13/2020   HGBA1C 5.4 07/14/2019   HGBA1C 5.2 03/24/2019   HGBA1C 5.3 12/17/2018   HGBA1C 5.5 08/26/2018   Lab Results  Component Value Date   INSULIN 5.2 01/13/2020   INSULIN 4.5 07/14/2019   INSULIN 4.2 03/24/2019   INSULIN 5.6 12/17/2018   INSULIN 14.2 08/26/2018   Lab Results  Component Value Date   TSH 0.89 08/26/2019   Lab Results  Component Value Date   CHOL 153 01/13/2020   HDL 55 01/13/2020   LDLCALC 88 01/13/2020   TRIG 46 01/13/2020   CHOLHDL 2.8 01/13/2020   Lab Results  Component Value Date   WBC 9.6 03/28/2020   HGB 12.3 03/28/2020   HCT 36.5 03/28/2020   MCV 86.7 03/28/2020   PLT 370.0 03/28/2020   Lab Results  Component Value Date   IRON 114 07/29/2013   FERRITIN 14.8 07/29/2013   Attestation Statements:   Reviewed by clinician on day of visit: allergies, medications, problem list, medical history, surgical history, family history, social history, and previous encounter notes.  Time spent on visit including pre-visit chart review and post-visit charting and care was 30 minutes.   IMichaelene Song, am acting as transcriptionist for Abby Potash, PA-C   I have reviewed the above documentation for accuracy and completeness, and I agree with the above. Abby Potash, PA-C

## 2020-05-17 LAB — OB RESULTS CONSOLE GC/CHLAMYDIA
Chlamydia: NEGATIVE
Gonorrhea: NEGATIVE

## 2020-05-17 LAB — OB RESULTS CONSOLE HIV ANTIBODY (ROUTINE TESTING): HIV: NONREACTIVE

## 2020-05-17 LAB — OB RESULTS CONSOLE RUBELLA ANTIBODY, IGM: Rubella: IMMUNE

## 2020-05-17 LAB — OB RESULTS CONSOLE ABO/RH: RH Type: POSITIVE

## 2020-05-17 LAB — OB RESULTS CONSOLE HEPATITIS B SURFACE ANTIGEN: Hepatitis B Surface Ag: NEGATIVE

## 2020-05-17 LAB — OB RESULTS CONSOLE RPR: RPR: NONREACTIVE

## 2020-05-17 LAB — OB RESULTS CONSOLE ANTIBODY SCREEN: Antibody Screen: NEGATIVE

## 2020-06-07 ENCOUNTER — Ambulatory Visit (INDEPENDENT_AMBULATORY_CARE_PROVIDER_SITE_OTHER): Payer: BC Managed Care – PPO | Admitting: Physician Assistant

## 2020-06-07 ENCOUNTER — Encounter (INDEPENDENT_AMBULATORY_CARE_PROVIDER_SITE_OTHER): Payer: Self-pay | Admitting: Physician Assistant

## 2020-06-07 ENCOUNTER — Other Ambulatory Visit: Payer: Self-pay

## 2020-06-07 VITALS — BP 108/71 | HR 81 | Temp 97.7°F | Ht 63.0 in | Wt 207.0 lb

## 2020-06-07 DIAGNOSIS — E559 Vitamin D deficiency, unspecified: Secondary | ICD-10-CM

## 2020-06-07 DIAGNOSIS — R7303 Prediabetes: Secondary | ICD-10-CM | POA: Diagnosis not present

## 2020-06-07 DIAGNOSIS — Z6836 Body mass index (BMI) 36.0-36.9, adult: Secondary | ICD-10-CM | POA: Diagnosis not present

## 2020-06-08 NOTE — Progress Notes (Signed)
Chief Complaint:   OBESITY [name] is here to discuss her progress with her obesity treatment plan along with follow-up of her obesity related diagnoses. [name] is on keeping a food journal and adhering to recommended goals of 1500-1700 calories and 95 grams of protein daily and states she is following her eating plan approximately 75% of the time. [name] states she is doing 0 minutes 0 times per week.  Today's visit was #: 21 Starting weight: 293 lbs Starting date: 10/02/2017 Today's weight: 207 lbs Today's date: 06/07/2020 Total lbs lost to date: 86 Total lbs lost since last in-office visit: 0  Interim History: [name] is [redacted] weeks pregnant and will be 12 weeks tomorrow. She is averaging 85 grams of protein and 1629 calories. She continues to have slight food aversion. She is asking about ways to increase her vegetable intake. She is established with an OB and will be following up with them regularly.   Subjective:   1. Vitamin D deficiency [name] is on Vit D OTC 4,000 units daily, and she is tolerating it well. Per the patient, her OB wants her to continue with Vit D.  2. Pre-diabetes [name]'s last A1c was 5.3. She is on metformin, and she denies nausea, vomiting, or diarrhea.  Assessment/Plan:   1. Vitamin D deficiency Low Vitamin D level contributes to fatigue and are associated with obesity, breast, and colon cancer. [name] agreed to continue taking OTC Vitamin D and will follow-up for routine testing of Vitamin D, at least 2-3 times per year to avoid over-replacement.  2. Pre-diabetes [name] will continue metformin, and she will continue to work on weight loss, exercise, and decreasing simple carbohydrates to help decrease the risk of diabetes.   3. Class 2 severe obesity with serious comorbidity and body mass index (BMI) of 36.0 to 36.9 in adult, unspecified obesity type (HCC) [name] is currently in the action stage of change. As such, her goal is to continue with weight  loss efforts. She has agreed to keeping a food journal and adhering to recommended goals of 1600-1800 calories and 95 grams of protein daily.   Exercise goals: No exercise has been prescribed at this time.  Behavioral modification strategies: increasing lean protein intake and increasing vegetables.  [name] has agreed to follow-up with our clinic in 4 weeks. She was informed of the importance of frequent follow-up visits to maximize her success with intensive lifestyle modifications for her multiple health conditions.   Objective:   Blood pressure 108/71, pulse 81, temperature 97.7 F (36.5 C), height 5' 3"  (1.6 m), weight 207 lb (93.9 kg), last menstrual period 03/01/2020, SpO2 96 %. Body mass index is 36.67 kg/m.  General: Cooperative, alert, well developed, in no acute distress. HEENT: Conjunctivae and lids unremarkable. Cardiovascular: Regular rhythm.  Lungs: Normal work of breathing. Neurologic: No focal deficits.   Lab Results  Component Value Date   CREATININE 0.71 03/28/2020   BUN 13 03/28/2020   NA 135 03/28/2020   K 3.9 03/28/2020   CL 102 03/28/2020   CO2 26 03/28/2020   Lab Results  Component Value Date   ALT 11 03/28/2020   AST 13 03/28/2020   ALKPHOS 60 03/28/2020   BILITOT 0.2 03/28/2020   Lab Results  Component Value Date   HGBA1C 5.3 01/13/2020   HGBA1C 5.4 07/14/2019   HGBA1C 5.2 03/24/2019   HGBA1C 5.3 12/17/2018   HGBA1C 5.5 08/26/2018   Lab Results  Component Value Date   INSULIN 5.2 01/13/2020  INSULIN 4.5 07/14/2019   INSULIN 4.2 03/24/2019   INSULIN 5.6 12/17/2018   INSULIN 14.2 08/26/2018   Lab Results  Component Value Date   TSH 0.89 08/26/2019   Lab Results  Component Value Date   CHOL 153 01/13/2020   HDL 55 01/13/2020   LDLCALC 88 01/13/2020   TRIG 46 01/13/2020   CHOLHDL 2.8 01/13/2020   Lab Results  Component Value Date   WBC 9.6 03/28/2020   HGB 12.3 03/28/2020   HCT 36.5 03/28/2020   MCV 86.7 03/28/2020   PLT  370.0 03/28/2020   Lab Results  Component Value Date   IRON 114 07/29/2013   FERRITIN 14.8 07/29/2013   Attestation Statements:   Reviewed by clinician on day of visit: allergies, medications, problem list, medical history, surgical history, family history, social history, and previous encounter notes.  Time spent on visit including pre-visit chart review and post-visit care and charting was 30 minutes.    Wilhemena Durie, am acting as transcriptionist for [employer], PA-C.  I have reviewed the above documentation for accuracy and completeness, and I agree with the above. Abby Potash, PA-C

## 2020-06-28 ENCOUNTER — Encounter: Payer: Self-pay | Admitting: Internal Medicine

## 2020-07-06 ENCOUNTER — Ambulatory Visit (INDEPENDENT_AMBULATORY_CARE_PROVIDER_SITE_OTHER): Payer: BC Managed Care – PPO | Admitting: Physician Assistant

## 2020-07-06 ENCOUNTER — Encounter (INDEPENDENT_AMBULATORY_CARE_PROVIDER_SITE_OTHER): Payer: Self-pay | Admitting: Physician Assistant

## 2020-07-06 ENCOUNTER — Other Ambulatory Visit: Payer: Self-pay

## 2020-07-06 VITALS — BP 133/68 | HR 88 | Temp 98.2°F | Ht 63.0 in | Wt 209.0 lb

## 2020-07-06 DIAGNOSIS — E559 Vitamin D deficiency, unspecified: Secondary | ICD-10-CM | POA: Diagnosis not present

## 2020-07-06 DIAGNOSIS — Z9189 Other specified personal risk factors, not elsewhere classified: Secondary | ICD-10-CM

## 2020-07-06 DIAGNOSIS — Z6837 Body mass index (BMI) 37.0-37.9, adult: Secondary | ICD-10-CM | POA: Diagnosis not present

## 2020-07-06 DIAGNOSIS — R7303 Prediabetes: Secondary | ICD-10-CM

## 2020-07-06 NOTE — Progress Notes (Signed)
Chief Complaint:   OBESITY [name] is here to discuss her progress with her obesity treatment plan along with follow-up of her obesity related diagnoses. [name] is on keeping a food journal and adhering to recommended goals of 1600-1800 calories and 95 grams of protein daily and states she is following her eating plan approximately 60% of the time. [name] states she is doing 0 minutes 0 times per week.  Today's visit was #: 95 Starting weight: 293 lbs Starting date: 10/02/2017 Today's weight: 209 lbs Today's date: 07/06/2020 Total lbs lost to date: 84 Total lbs lost since last in-office visit: 0  Interim History: [name] is [redacted] weeks pregnant today. She continues to have an aversion to chicken but she is getting other sources of protein in such as yogurt, lean pork, and some nuts.  Subjective:   1. Pre-diabetes [name] is on metformin. Last A1c was 5.3. She is seeing her OBGYN currently due to pregnancy. She denies polyphagia or cravings.  2. Vitamin D deficiency [name]'s last Vit D level was at goal. She is supplementing with OTC Vit D.  3. At risk for diabetes mellitus [name] is at higher than average risk for developing diabetes due to obesity.   Assessment/Plan:   1. Pre-diabetes [name] will continue to work on weight loss, exercise, and decreasing simple carbohydrates to help decrease the risk of diabetes. We will check labs today.  - Hemoglobin A1c - Insulin, random - Comprehensive metabolic panel  2. Vitamin D deficiency Low Vitamin D level contributes to fatigue and are associated with obesity, breast, and colon cancer. We will check labs today. Brock will follow-up for routine testing of Vitamin D, at least 2-3 times per year to avoid over-replacement.  - VITAMIN D 25 Hydroxy (Vit-D Deficiency, Fractures)  3. At risk for diabetes mellitus [name] was given approximately 15 minutes of diabetes education and counseling today. We discussed intensive lifestyle  modifications today with an emphasis on weight loss as well as increasing exercise and decreasing simple carbohydrates in her diet. We also reviewed medication options with an emphasis on risk versus benefit of those discussed.   Repetitive spaced learning was employed today to elicit superior memory formation and behavioral change.  4. Class 2 severe obesity with serious comorbidity and body mass index (BMI) of 37.0 to 37.9 in adult, unspecified obesity type (HCC) [name] is currently in the action stage of change. As such, her goal is to continue with weight loss efforts. She has agreed to keeping a food journal and adhering to recommended goals of 1500-1800 calories and 95 grams of protein daily.   Exercise goals: No exercise has been prescribed at this time.  Behavioral modification strategies: meal planning and cooking strategies and keeping healthy foods in the home.  [name] has agreed to follow-up with our clinic in 4 weeks. She was informed of the importance of frequent follow-up visits to maximize her success with intensive lifestyle modifications for her multiple health conditions.   [name] was informed we would discuss her lab results at her next visit unless there is a critical issue that needs to be addressed sooner. [name] agreed to keep her next visit at the agreed upon time to discuss these results.  Objective:   Blood pressure 133/68, pulse 88, temperature 98.2 F (36.8 C), height 5' 3"  (1.6 m), weight 209 lb (94.8 kg), SpO2 99 %. Body mass index is 37.02 kg/m.  General: Cooperative, alert, well developed, in no acute distress. HEENT: Conjunctivae and lids unremarkable.  Cardiovascular: Regular rhythm.  Lungs: Normal work of breathing. Neurologic: No focal deficits.   Lab Results  Component Value Date   CREATININE 0.71 03/28/2020   BUN 13 03/28/2020   NA 135 03/28/2020   K 3.9 03/28/2020   CL 102 03/28/2020   CO2 26 03/28/2020   Lab Results  Component Value Date    ALT 11 03/28/2020   AST 13 03/28/2020   ALKPHOS 60 03/28/2020   BILITOT 0.2 03/28/2020   Lab Results  Component Value Date   HGBA1C 5.3 01/13/2020   HGBA1C 5.4 07/14/2019   HGBA1C 5.2 03/24/2019   HGBA1C 5.3 12/17/2018   HGBA1C 5.5 08/26/2018   Lab Results  Component Value Date   INSULIN 5.2 01/13/2020   INSULIN 4.5 07/14/2019   INSULIN 4.2 03/24/2019   INSULIN 5.6 12/17/2018   INSULIN 14.2 08/26/2018   Lab Results  Component Value Date   TSH 0.89 08/26/2019   Lab Results  Component Value Date   CHOL 153 01/13/2020   HDL 55 01/13/2020   LDLCALC 88 01/13/2020   TRIG 46 01/13/2020   CHOLHDL 2.8 01/13/2020   Lab Results  Component Value Date   WBC 9.6 03/28/2020   HGB 12.3 03/28/2020   HCT 36.5 03/28/2020   MCV 86.7 03/28/2020   PLT 370.0 03/28/2020   Lab Results  Component Value Date   IRON 114 07/29/2013   FERRITIN 14.8 07/29/2013   Attestation Statements:   Reviewed by clinician on day of visit: allergies, medications, problem list, medical history, surgical history, family history, social history, and previous encounter notes.   Wilhemena Durie, am acting as transcriptionist for [employer], PA-C.  I have reviewed the above documentation for accuracy and completeness, and I agree with the above. Abby Potash, PA-C

## 2020-07-07 LAB — COMPREHENSIVE METABOLIC PANEL
ALT: 13 IU/L (ref 0–32)
AST: 15 IU/L (ref 0–40)
Albumin/Globulin Ratio: 1 — ABNORMAL LOW (ref 1.2–2.2)
Albumin: 3.4 g/dL — ABNORMAL LOW (ref 3.8–4.8)
Alkaline Phosphatase: 58 IU/L (ref 44–121)
BUN/Creatinine Ratio: 15 (ref 9–23)
BUN: 8 mg/dL (ref 6–20)
Bilirubin Total: 0.2 mg/dL (ref 0.0–1.2)
CO2: 21 mmol/L (ref 20–29)
Calcium: 8.7 mg/dL (ref 8.7–10.2)
Chloride: 104 mmol/L (ref 96–106)
Creatinine, Ser: 0.55 mg/dL — ABNORMAL LOW (ref 0.57–1.00)
GFR calc Af Amer: 139 mL/min/{1.73_m2} (ref >59)
GFR calc non Af Amer: 120 mL/min/{1.73_m2} (ref >59)
Globulin, Total: 3.5 g/dL (ref 1.5–4.5)
Glucose: 69 mg/dL (ref 65–99)
Potassium: 4 mmol/L (ref 3.5–5.2)
Sodium: 138 mmol/L (ref 134–144)
Total Protein: 6.9 g/dL (ref 6.0–8.5)

## 2020-07-07 LAB — VITAMIN D 25 HYDROXY (VIT D DEFICIENCY, FRACTURES): Vit D, 25-Hydroxy: 55.7 ng/mL (ref 30.0–100.0)

## 2020-07-07 LAB — HEMOGLOBIN A1C
Est. average glucose Bld gHb Est-mCnc: 111 mg/dL
Hgb A1c MFr Bld: 5.5 % (ref 4.8–5.6)

## 2020-07-07 LAB — INSULIN, RANDOM: INSULIN: 3 u[IU]/mL (ref 2.6–24.9)

## 2020-08-02 ENCOUNTER — Telehealth (INDEPENDENT_AMBULATORY_CARE_PROVIDER_SITE_OTHER): Payer: BC Managed Care – PPO | Admitting: Physician Assistant

## 2020-08-02 DIAGNOSIS — Z6837 Body mass index (BMI) 37.0-37.9, adult: Secondary | ICD-10-CM | POA: Diagnosis not present

## 2020-08-02 DIAGNOSIS — R7303 Prediabetes: Secondary | ICD-10-CM

## 2020-08-02 NOTE — Progress Notes (Signed)
TeleHealth Visit:  Due to the COVID-19 pandemic, this visit was completed with telemedicine (audio/video) technology to reduce patient and provider exposure as well as to preserve personal protective equipment.   [name] has verbally consented to this TeleHealth visit. The patient is located at home, the provider is located at the [facility] office. The participants in this visit include the listed provider and patient. The visit was conducted today via Mychart video.  Chief Complaint: OBESITY [name] is here to discuss her progress with her obesity treatment plan along with follow-up of her obesity related diagnoses. [name] is on keeping a food journal and adhering to recommended goals of 1800 calories and 95 grams of protein and states she is following her eating plan approximately 75% of the time. [name] states she is not exercising.  Today's visit was #: 55 Starting weight: 293 lbs Starting date: 10/02/17  Interim History: Harlan is [redacted] weeks pregnant. She continues to have a meat aversion, but is eating ground [place] at times. Her appetite is down overall and she is getting fuller faster. [name] checks in with her OB tomorrow. Her weight at home was 215 pounds.  Subjective:   1. Prediabetes [name] has a diagnosis of prediabetes based on her elevated HgA1c of 5.5 and was informed this puts her at greater risk of developing diabetes. [name] is on metformin and she continues to work on diet and exercise to decrease her risk of diabetes. She denies polyphagia, nausea or hypoglycemia.  Lab Results  Component Value Date   HGBA1C 5.5 07/06/2020   Lab Results  Component Value Date   INSULIN 3.0 07/06/2020   INSULIN 5.2 01/13/2020   INSULIN 4.5 07/14/2019   INSULIN 4.2 03/24/2019   INSULIN 5.6 12/17/2018    Assessment/Plan:   1. Prediabetes [name] will continue to take metformin and work on weight loss, exercise, and decreasing simple carbohydrates to help decrease  the risk of diabetes.   2. Class 2 severe obesity with serious comorbidity and body mass index (BMI) of 37.0 to 37.9 in adult, unspecified obesity type (HCC)  [name] is currently in the action stage of change. As such, her goal is to continue with weight loss efforts. She has agreed to keeping a food journal and adhering to recommended goals of 1800 calories and 95 grams of protein.   Exercise goals: No exercise has been prescribed at this time.  Behavioral modification strategies: increasing lean protein intake and no skipping meals.  [name] has agreed to follow-up with our clinic in 4 weeks. She was informed of the importance of frequent follow-up visits to maximize her success with intensive lifestyle modifications for her multiple health conditions.   Objective:   VITALS: Per patient if applicable, see vitals. GENERAL: Alert and in no acute distress. CARDIOPULMONARY: No increased WOB. Speaking in clear sentences.  PSYCH: Pleasant and cooperative. Speech normal rate and rhythm. Affect is appropriate. Insight and judgement are appropriate. Attention is focused, linear, and appropriate.  NEURO: Oriented as arrived to appointment on time with no prompting.   Lab Results  Component Value Date   CREATININE 0.55 (L) 07/06/2020   BUN 8 07/06/2020   NA 138 07/06/2020   K 4.0 07/06/2020   CL 104 07/06/2020   CO2 21 07/06/2020   Lab Results  Component Value Date   ALT 13 07/06/2020   AST 15 07/06/2020   ALKPHOS 58 07/06/2020   BILITOT 0.2 07/06/2020   Lab Results  Component Value Date  HGBA1C 5.5 07/06/2020   HGBA1C 5.3 01/13/2020   HGBA1C 5.4 07/14/2019   HGBA1C 5.2 03/24/2019   HGBA1C 5.3 12/17/2018   Lab Results  Component Value Date   INSULIN 3.0 07/06/2020   INSULIN 5.2 01/13/2020   INSULIN 4.5 07/14/2019   INSULIN 4.2 03/24/2019   INSULIN 5.6 12/17/2018   Lab Results  Component Value Date   TSH 0.89 08/26/2019   Lab Results  Component Value Date   CHOL 153  01/13/2020   HDL 55 01/13/2020   LDLCALC 88 01/13/2020   TRIG 46 01/13/2020   CHOLHDL 2.8 01/13/2020   Lab Results  Component Value Date   WBC 9.6 03/28/2020   HGB 12.3 03/28/2020   HCT 36.5 03/28/2020   MCV 86.7 03/28/2020   PLT 370.0 03/28/2020   Lab Results  Component Value Date   IRON 114 07/29/2013   FERRITIN 14.8 07/29/2013    Attestation Statements:   Reviewed by clinician on day of visit: allergies, medications, problem list, medical history, surgical history, family history, social history, and previous encounter notes.   Lenward Chancellor, CMA, am acting as transcriptionist for Abby Potash, PA-C  I have reviewed the above documentation for accuracy and completeness, and I agree with the above. Abby Potash, PA-C

## 2020-08-31 ENCOUNTER — Encounter (INDEPENDENT_AMBULATORY_CARE_PROVIDER_SITE_OTHER): Payer: Self-pay | Admitting: Physician Assistant

## 2020-08-31 ENCOUNTER — Other Ambulatory Visit: Payer: Self-pay

## 2020-08-31 ENCOUNTER — Ambulatory Visit (INDEPENDENT_AMBULATORY_CARE_PROVIDER_SITE_OTHER): Payer: BC Managed Care – PPO | Admitting: Physician Assistant

## 2020-08-31 VITALS — BP 101/69 | HR 90 | Temp 97.9°F | Ht 63.0 in | Wt 212.0 lb

## 2020-08-31 DIAGNOSIS — Z6841 Body Mass Index (BMI) 40.0 and over, adult: Secondary | ICD-10-CM | POA: Diagnosis not present

## 2020-08-31 DIAGNOSIS — Z9189 Other specified personal risk factors, not elsewhere classified: Secondary | ICD-10-CM

## 2020-08-31 DIAGNOSIS — E559 Vitamin D deficiency, unspecified: Secondary | ICD-10-CM

## 2020-08-31 DIAGNOSIS — R7303 Prediabetes: Secondary | ICD-10-CM

## 2020-09-07 NOTE — Progress Notes (Signed)
Chief Complaint:   OBESITY [name] is here to discuss her progress with her obesity treatment plan along with follow-up of her obesity related diagnoses. [name] is on keeping a food journal and adhering to recommended goals of 1500-1800 calories and 95 grams of protein and states she is following her eating plan approximately 0% of the time. [name] states she is not exercising.  Today's visit was #: 36 Starting weight: 293 lbs Starting date: 10/02/2017 Today's weight: 212 lbs Today's date: 08/31/2020 Total lbs lost to date: 81 lbs Total lbs lost since last in-office visit: 0  Interim History: [name] is [redacted] weeks pregnant today. Overall she is trying to get in enough protein and vegetables. She is not having cravings. She has her follow up visit with her OBGYN today.  Subjective:   1. Vitamin D deficiency Avyanna's Vitamin D level was 55.7 on 07/06/2020. She is currently taking OTC vitamin D 2000 units each day. She denies nausea, vomiting or muscle weakness. Last level at goal.  2. Prediabetes [name] has a diagnosis of prediabetes based on her elevated HgA1c and was informed this puts her at greater risk of developing diabetes. She continues to work on diet and exercise to decrease her risk of diabetes. She denies nausea or hypoglycemia. She is not having cravings. Medication: Metformin 500 mg twice daily  Lab Results  Component Value Date   HGBA1C 5.5 07/06/2020   Lab Results  Component Value Date   INSULIN 3.0 07/06/2020   INSULIN 5.2 01/13/2020   INSULIN 4.5 07/14/2019   INSULIN 4.2 03/24/2019   INSULIN 5.6 12/17/2018   3. At risk for diabetes mellitus [name] is at higher than average risk for developing diabetes due to obesity.   Assessment/Plan:   1. Vitamin D deficiency Low Vitamin D level contributes to fatigue and are associated with obesity, breast, and colon cancer. She agrees to continue to take prescription Vitamin D @2 ,000 IU every day and will follow-up for  routine testing of Vitamin D, at least 2-3 times per year to avoid over-replacement.  2. Prediabetes Jode will continue to work on weight loss, exercise, and decreasing simple carbohydrates to help decrease the risk of diabetes. Continue Metformin.  3. At risk for diabetes mellitus [name] was given approximately 15 minutes of diabetes education and counseling today. We discussed intensive lifestyle modifications today with an emphasis on weight loss as well as increasing exercise and decreasing simple carbohydrates in her diet. We also reviewed medication options with an emphasis on risk versus benefit of those discussed.   Repetitive spaced learning was employed today to elicit superior memory formation and behavioral change.  4. Obesity, current BMI 37.7  [name] is currently in the action stage of change. As such, her goal is to continue with weight loss efforts. She has agreed to keeping a food journal and adhering to recommended goals of 1500-1800 calories and 95 grams of protein.   Exercise goals: For substantial health benefits, adults should do at least 150 minutes (2 hours and 30 minutes) a week of moderate-intensity, or 75 minutes (1 hour and 15 minutes) a week of vigorous-intensity aerobic physical activity, or an equivalent combination of moderate- and vigorous-intensity aerobic activity. Aerobic activity should be performed in episodes of at least 10 minutes, and preferably, it should be spread throughout the week.  Behavioral modification strategies: meal planning and cooking strategies and planning for success.  Tiffay has agreed to follow-up with our clinic in 4 weeks. She was informed of  the importance of frequent follow-up visits to maximize her success with intensive lifestyle modifications for her multiple health conditions.   Objective:   Blood pressure 101/69, pulse 90, temperature 97.9 F (36.6 C), height 5' 3"  (1.6 m), weight 212 lb (96.2 kg), SpO2 98 %. Body mass  index is 37.55 kg/m.  General: Cooperative, alert, well developed, in no acute distress. HEENT: Conjunctivae and lids unremarkable. Cardiovascular: Regular rhythm.  Lungs: Normal work of breathing. Neurologic: No focal deficits.   Lab Results  Component Value Date   CREATININE 0.55 (L) 07/06/2020   BUN 8 07/06/2020   NA 138 07/06/2020   K 4.0 07/06/2020   CL 104 07/06/2020   CO2 21 07/06/2020   Lab Results  Component Value Date   ALT 13 07/06/2020   AST 15 07/06/2020   ALKPHOS 58 07/06/2020   BILITOT 0.2 07/06/2020   Lab Results  Component Value Date   HGBA1C 5.5 07/06/2020   HGBA1C 5.3 01/13/2020   HGBA1C 5.4 07/14/2019   HGBA1C 5.2 03/24/2019   HGBA1C 5.3 12/17/2018   Lab Results  Component Value Date   INSULIN 3.0 07/06/2020   INSULIN 5.2 01/13/2020   INSULIN 4.5 07/14/2019   INSULIN 4.2 03/24/2019   INSULIN 5.6 12/17/2018   Lab Results  Component Value Date   TSH 0.89 08/26/2019   Lab Results  Component Value Date   CHOL 153 01/13/2020   HDL 55 01/13/2020   LDLCALC 88 01/13/2020   TRIG 46 01/13/2020   CHOLHDL 2.8 01/13/2020   Lab Results  Component Value Date   WBC 9.6 03/28/2020   HGB 12.3 03/28/2020   HCT 36.5 03/28/2020   MCV 86.7 03/28/2020   PLT 370.0 03/28/2020   Lab Results  Component Value Date   IRON 114 07/29/2013   FERRITIN 14.8 07/29/2013   Attestation Statements:   Reviewed by clinician on day of visit: allergies, medications, problem list, medical history, surgical history, family history, social history, and previous encounter notes.  Leodis Binet Friedenbach, CMA, am acting as Location manager for [employer], PA-C.  I have reviewed the above documentation for accuracy and completeness, and I agree with the above. Abby Potash, PA-C

## 2020-09-28 ENCOUNTER — Encounter (INDEPENDENT_AMBULATORY_CARE_PROVIDER_SITE_OTHER): Payer: Self-pay | Admitting: Physician Assistant

## 2020-09-28 ENCOUNTER — Other Ambulatory Visit: Payer: Self-pay

## 2020-09-28 ENCOUNTER — Ambulatory Visit (INDEPENDENT_AMBULATORY_CARE_PROVIDER_SITE_OTHER): Payer: BC Managed Care – PPO | Admitting: Physician Assistant

## 2020-09-28 DIAGNOSIS — Z6841 Body Mass Index (BMI) 40.0 and over, adult: Secondary | ICD-10-CM

## 2020-09-28 DIAGNOSIS — E7849 Other hyperlipidemia: Secondary | ICD-10-CM | POA: Diagnosis not present

## 2020-09-29 NOTE — Progress Notes (Signed)
Chief Complaint:   OBESITY [name] is here to discuss her progress with her obesity treatment plan along with follow-up of her obesity related diagnoses. [name] is on keeping a food journal and adhering to recommended goals of 1500-1800 calories and 95 grams of protein daily and states she is following her eating plan approximately 60% of the time. [name] states she is doing 0 minutes 0 times per week.  Today's visit was #: 46 Starting weight: 293 lbs Starting date: 10/02/2017 Today's weight: 217 lbs Today's date: 09/28/2020 Total lbs lost to date: 64 Total lbs lost since last in-office visit: 0  Interim History: [name] is [redacted] weeks pregnant. She is drinking more water and eating more protein. She has not been journaling consistenetly. She is only eating around 60 grams of protein some days.  Subjective:   1. Other hyperlipidemia [name] is not on medications. Last lipid panel was at goal.  Assessment/Plan:   1. Other hyperlipidemia Cardiovascular risk and specific lipid/LDL goals reviewed. We discussed several lifestyle modifications today. [name] will continue to work on diet, exercise and weight loss efforts. We will recheck labs at her next visit. Orders and follow up as documented in patient record.   Counseling Intensive lifestyle modifications are the first line treatment for this issue. . Dietary changes: Increase soluble fiber. Decrease simple carbohydrates. . Exercise changes: Moderate to vigorous-intensity aerobic activity 150 minutes per week if tolerated. . Lipid-lowering medications: see documented in medical record.  2. Class 3 severe obesity with serious comorbidity and body mass index (BMI) of 40.0 to 44.9 in adult, unspecified obesity type (HCC) [name] is currently in the action stage of change. As such, her goal is to continue with weight loss efforts. She has agreed to keeping a food journal and adhering to recommended goals of 1500 calories and 100 grams of  protein daily.   We will recheck labs at her next visit.  Exercise goals: No exercise has been prescribed at this time.  Behavioral modification strategies: meal planning and cooking strategies and keeping healthy foods in the home.  [name] has agreed to follow-up with our clinic in 4 to 5 weeks. She was informed of the importance of frequent follow-up visits to maximize her success with intensive lifestyle modifications for her multiple health conditions.   Objective:   Blood pressure (!) 96/59, pulse 96, temperature 98.2 F (36.8 C), weight 217 lb (98.4 kg), SpO2 98 %. Body mass index is 38.44 kg/m.  General: Cooperative, alert, well developed, in no acute distress. HEENT: Conjunctivae and lids unremarkable. Cardiovascular: Regular rhythm.  Lungs: Normal work of breathing. Neurologic: No focal deficits.   Lab Results  Component Value Date   CREATININE 0.55 (L) 07/06/2020   BUN 8 07/06/2020   NA 138 07/06/2020   K 4.0 07/06/2020   CL 104 07/06/2020   CO2 21 07/06/2020   Lab Results  Component Value Date   ALT 13 07/06/2020   AST 15 07/06/2020   ALKPHOS 58 07/06/2020   BILITOT 0.2 07/06/2020   Lab Results  Component Value Date   HGBA1C 5.5 07/06/2020   HGBA1C 5.3 01/13/2020   HGBA1C 5.4 07/14/2019   HGBA1C 5.2 03/24/2019   HGBA1C 5.3 12/17/2018   Lab Results  Component Value Date   INSULIN 3.0 07/06/2020   INSULIN 5.2 01/13/2020   INSULIN 4.5 07/14/2019   INSULIN 4.2 03/24/2019   INSULIN 5.6 12/17/2018   Lab Results  Component Value Date   TSH 0.89 08/26/2019  Lab Results  Component Value Date   CHOL 153 01/13/2020   HDL 55 01/13/2020   LDLCALC 88 01/13/2020   TRIG 46 01/13/2020   CHOLHDL 2.8 01/13/2020   Lab Results  Component Value Date   WBC 9.6 03/28/2020   HGB 12.3 03/28/2020   HCT 36.5 03/28/2020   MCV 86.7 03/28/2020   PLT 370.0 03/28/2020   Lab Results  Component Value Date   IRON 114 07/29/2013   FERRITIN 14.8 07/29/2013    Attestation Statements:   Reviewed by clinician on day of visit: allergies, medications, problem list, medical history, surgical history, family history, social history, and previous encounter notes.  Time spent on visit including pre-visit chart review and post-visit care and charting was 30 minutes.    [name], am acting as transcriptionist for [employer], PA-C.  I have reviewed the above documentation for accuracy and completeness, and I agree with the above. Donna Potash, PA-C

## 2020-10-27 ENCOUNTER — Other Ambulatory Visit: Payer: Self-pay | Admitting: Obstetrics and Gynecology

## 2020-10-31 ENCOUNTER — Encounter (INDEPENDENT_AMBULATORY_CARE_PROVIDER_SITE_OTHER): Payer: Self-pay | Admitting: Physician Assistant

## 2020-10-31 ENCOUNTER — Ambulatory Visit (INDEPENDENT_AMBULATORY_CARE_PROVIDER_SITE_OTHER): Payer: BC Managed Care – PPO | Admitting: Physician Assistant

## 2020-10-31 ENCOUNTER — Other Ambulatory Visit: Payer: Self-pay

## 2020-10-31 VITALS — BP 94/59 | HR 85 | Temp 97.8°F | Ht 63.0 in | Wt 222.0 lb

## 2020-10-31 DIAGNOSIS — Z9189 Other specified personal risk factors, not elsewhere classified: Secondary | ICD-10-CM | POA: Diagnosis not present

## 2020-10-31 DIAGNOSIS — R7303 Prediabetes: Secondary | ICD-10-CM | POA: Diagnosis not present

## 2020-10-31 DIAGNOSIS — E66813 Obesity, class 3: Secondary | ICD-10-CM

## 2020-10-31 DIAGNOSIS — E7849 Other hyperlipidemia: Secondary | ICD-10-CM

## 2020-10-31 DIAGNOSIS — Z6841 Body Mass Index (BMI) 40.0 and over, adult: Secondary | ICD-10-CM

## 2020-10-31 DIAGNOSIS — E559 Vitamin D deficiency, unspecified: Secondary | ICD-10-CM | POA: Diagnosis not present

## 2020-11-01 LAB — COMPREHENSIVE METABOLIC PANEL
ALT: 11 IU/L (ref 0–32)
AST: 21 IU/L (ref 0–40)
Albumin/Globulin Ratio: 1 — ABNORMAL LOW (ref 1.2–2.2)
Albumin: 3.3 g/dL — ABNORMAL LOW (ref 3.8–4.8)
Alkaline Phosphatase: 80 IU/L (ref 44–121)
BUN/Creatinine Ratio: 9 (ref 9–23)
BUN: 5 mg/dL — ABNORMAL LOW (ref 6–20)
Bilirubin Total: <0.2 mg/dL (ref 0.0–1.2)
CO2: 21 mmol/L (ref 20–29)
Calcium: 9.3 mg/dL (ref 8.7–10.2)
Chloride: 103 mmol/L (ref 96–106)
Creatinine, Ser: 0.53 mg/dL — ABNORMAL LOW (ref 0.57–1.00)
Globulin, Total: 3.3 g/dL (ref 1.5–4.5)
Glucose: 67 mg/dL (ref 65–99)
Potassium: 4.1 mmol/L (ref 3.5–5.2)
Sodium: 139 mmol/L (ref 134–144)
Total Protein: 6.6 g/dL (ref 6.0–8.5)
eGFR: 122 mL/min/{1.73_m2} (ref >59)

## 2020-11-01 LAB — LIPID PANEL
Chol/HDL Ratio: 2.5 ratio (ref 0.0–4.4)
Cholesterol, Total: 195 mg/dL (ref 100–199)
HDL: 79 mg/dL (ref >39)
LDL Chol Calc (NIH): 98 mg/dL (ref 0–99)
Triglycerides: 105 mg/dL (ref 0–149)
VLDL Cholesterol Cal: 18 mg/dL (ref 5–40)

## 2020-11-01 LAB — HEMOGLOBIN A1C
Est. average glucose Bld gHb Est-mCnc: 100 mg/dL
Hgb A1c MFr Bld: 5.1 % (ref 4.8–5.6)

## 2020-11-01 LAB — INSULIN, RANDOM: INSULIN: 4.2 u[IU]/mL (ref 2.6–24.9)

## 2020-11-01 LAB — VITAMIN D 25 HYDROXY (VIT D DEFICIENCY, FRACTURES): Vit D, 25-Hydroxy: 53.9 ng/mL (ref 30.0–100.0)

## 2020-11-02 NOTE — Progress Notes (Signed)
Chief Complaint:   OBESITY [name] is here to discuss her progress with her obesity treatment plan along with follow-up of her obesity related diagnoses. [name] is on keeping a food journal and adhering to recommended goals of 1500 calories and 100 grams of protein daily and states she is following her eating plan approximately 65% of the time. [name] states she is doing 0 minutes 0 times per week.  Today's visit was #: 26 Starting weight: 293 lbs Starting date: 10/02/2017 Today's weight: 222 lbs Today's date: 10/31/2020 Total lbs lost to date: 63 Total lbs lost since last in-office visit: 0  Interim History: [name] is [redacted] weeks pregnant. She has gained 12 pounds over her pregnancy and she is eating well. She is trying to get enough water, fiber, and protein. She is having a c-section July 6th.  Subjective:   1. Pre-diabetes [name] is not on medications, and her last A1c was 5.5. She denies polyphagia.  2. Other hyperlipidemia [name] is not on medications, and she denies chest pain.  3. Vitamin D deficiency [name] is on OTC Vit D, and she is tolerating it well.  4. At risk for diabetes mellitus [name] is at higher than average risk for developing diabetes due to obesity.   Assessment/Plan:   1. Pre-diabetes [name] will continue to work on weight loss, exercise, and decreasing simple carbohydrates to help decrease the risk of diabetes. We will check labs today.  - Comprehensive metabolic panel - Hemoglobin A1c - Insulin, random  2. Other hyperlipidemia Cardiovascular risk and specific lipid/LDL goals reviewed. We discussed several lifestyle modifications today. [name] will continue to work on diet, exercise and weight loss efforts. We will check labs today. Orders and follow up as documented in patient record.   - Lipid panel  3. Vitamin D deficiency Low Vitamin D level contributes to fatigue and are associated with obesity, breast, and colon cancer. We will check labs  today. [name] will follow-up for routine testing of Vitamin D, at least 2-3 times per year to avoid over-replacement.  - VITAMIN D 25 Hydroxy (Vit-D Deficiency, Fractures)  4. At risk for diabetes mellitus [name] was given approximately 15 minutes of diabetes education and counseling today. We discussed intensive lifestyle modifications today with an emphasis on weight loss as well as increasing exercise and decreasing simple carbohydrates in her diet. We also reviewed medication options with an emphasis on risk versus benefit of those discussed.   Repetitive spaced learning was employed today to elicit superior memory formation and behavioral change.  5. Class 3 severe obesity with serious comorbidity and body mass index (BMI) of 40.0 to 44.9 in adult, unspecified obesity type (HCC) [name] is currently in the action stage of change. As such, her goal is to continue with weight loss efforts. She has agreed to keeping a food journal and adhering to recommended goals of 1500 calories and 100 grams of protein daily.   Behavioral modification strategies: no skipping meals and meal planning and cooking strategies.  [name] has agreed to follow-up with our clinic in 8 to 10 weeks. She was informed of the importance of frequent follow-up visits to maximize her success with intensive lifestyle modifications for her multiple health conditions.   [name] was informed we would discuss her lab results at her next visit unless there is a critical issue that needs to be addressed sooner. [name] agreed to keep her next visit at the agreed upon time to discuss these results.  Objective:   Blood  pressure (!) 94/59, pulse 85, temperature 97.8 F (36.6 C), height 5' 3"  (1.6 m), weight 222 lb (100.7 kg), SpO2 97 %. Body mass index is 39.33 kg/m.  General: Cooperative, alert, well developed, in no acute distress. HEENT: Conjunctivae and lids unremarkable. Cardiovascular: Regular rhythm.  Lungs: Normal work of  breathing. Neurologic: No focal deficits.   Lab Results  Component Value Date   CREATININE 0.53 (L) 10/31/2020   BUN 5 (L) 10/31/2020   NA 139 10/31/2020   K 4.1 10/31/2020   CL 103 10/31/2020   CO2 21 10/31/2020   Lab Results  Component Value Date   ALT 11 10/31/2020   AST 21 10/31/2020   ALKPHOS 80 10/31/2020   BILITOT <0.2 10/31/2020   Lab Results  Component Value Date   HGBA1C 5.1 10/31/2020   HGBA1C 5.5 07/06/2020   HGBA1C 5.3 01/13/2020   HGBA1C 5.4 07/14/2019   HGBA1C 5.2 03/24/2019   Lab Results  Component Value Date   INSULIN 4.2 10/31/2020   INSULIN 3.0 07/06/2020   INSULIN 5.2 01/13/2020   INSULIN 4.5 07/14/2019   INSULIN 4.2 03/24/2019   Lab Results  Component Value Date   TSH 0.89 08/26/2019   Lab Results  Component Value Date   CHOL 195 10/31/2020   HDL 79 10/31/2020   LDLCALC 98 10/31/2020   TRIG 105 10/31/2020   CHOLHDL 2.5 10/31/2020   Lab Results  Component Value Date   WBC 9.6 03/28/2020   HGB 12.3 03/28/2020   HCT 36.5 03/28/2020   MCV 86.7 03/28/2020   PLT 370.0 03/28/2020   Lab Results  Component Value Date   IRON 114 07/29/2013   FERRITIN 14.8 07/29/2013   Attestation Statements:   Reviewed by clinician on day of visit: allergies, medications, problem list, medical history, surgical history, family history, social history, and previous encounter notes.   Wilhemena Durie, am acting as transcriptionist for [employer], PA-C.  I have reviewed the above documentation for accuracy and completeness, and I agree with the above. Donna Potash, PA-C

## 2020-11-17 ENCOUNTER — Encounter (INDEPENDENT_AMBULATORY_CARE_PROVIDER_SITE_OTHER): Payer: Self-pay | Admitting: Physician Assistant

## 2020-11-17 NOTE — Telephone Encounter (Signed)
Passed this msg to Marcie Bal to take a look at.

## 2020-11-22 ENCOUNTER — Encounter (HOSPITAL_COMMUNITY): Payer: Self-pay | Admitting: *Deleted

## 2020-11-22 NOTE — Patient Instructions (Signed)
[name]  11/22/2020   Your procedure is scheduled on:  11/30/2020  Arrive at 1:00 PM at Entrance C on [address] at [facility]  and [address]. You are invited to use the FREE valet parking or use the Visitor's parking deck.  Pick up the phone at the desk and dial [phone].  Call this number if you have problems the morning of surgery: [phone]  Remember:   Do not eat food:(After Midnight) Desps de medianoche.  Do not drink clear liquids: (After Midnight) Desps de medianoche.  Take these medicines the morning of surgery with A SIP OF WATER:  none   Do not wear jewelry, make-up or nail polish.  Do not wear lotions, powders, or perfumes. Do not wear deodorant.  Do not shave 48 hours prior to surgery.  Do not bring valuables to the hospital.  [facility] is not   responsible for any belongings or valuables brought to the hospital.  Contacts, dentures or bridgework may not be worn into surgery.  Leave suitcase in the car. After surgery it may be brought to your room.  For patients admitted to the hospital, checkout time is 11:00 AM the day of              discharge.      Please read over the following fact sheets that you were given:     Preparing for Surgery

## 2020-11-23 ENCOUNTER — Encounter (INDEPENDENT_AMBULATORY_CARE_PROVIDER_SITE_OTHER): Payer: Self-pay

## 2020-11-24 ENCOUNTER — Telehealth (HOSPITAL_COMMUNITY): Payer: Self-pay | Admitting: *Deleted

## 2020-11-24 NOTE — Telephone Encounter (Signed)
Preadmission screen  

## 2020-11-25 ENCOUNTER — Encounter (HOSPITAL_COMMUNITY): Payer: Self-pay

## 2020-11-29 ENCOUNTER — Other Ambulatory Visit (HOSPITAL_COMMUNITY)
Admission: RE | Admit: 2020-11-29 | Discharge: 2020-11-29 | Disposition: A | Discharge status: Home / Self Care | Payer: BC Managed Care – PPO | Source: Ambulatory Visit | Attending: Obstetrics and Gynecology | Admitting: Obstetrics and Gynecology

## 2020-11-29 ENCOUNTER — Other Ambulatory Visit: Payer: Self-pay

## 2020-11-29 ENCOUNTER — Encounter (HOSPITAL_COMMUNITY): Payer: Self-pay | Admitting: Obstetrics and Gynecology

## 2020-11-29 ENCOUNTER — Encounter (HOSPITAL_COMMUNITY)
Admission: RE | Admit: 2020-11-29 | Discharge: 2020-11-29 | Disposition: A | Discharge status: Home / Self Care | Payer: BC Managed Care – PPO | Source: Ambulatory Visit | Attending: Obstetrics and Gynecology | Admitting: Obstetrics and Gynecology

## 2020-11-29 DIAGNOSIS — Z01812 Encounter for preprocedural laboratory examination: Secondary | ICD-10-CM | POA: Insufficient documentation

## 2020-11-29 DIAGNOSIS — Z20822 Contact with and (suspected) exposure to covid-19: Secondary | ICD-10-CM | POA: Insufficient documentation

## 2020-11-29 LAB — CBC
HCT: 37.8 % (ref 36.0–46.0)
Hemoglobin: 11.6 g/dL — ABNORMAL LOW (ref 12.0–15.0)
MCH: 26.4 pg (ref 26.0–34.0)
MCHC: 30.7 g/dL (ref 30.0–36.0)
MCV: 86.1 fL (ref 80.0–100.0)
Platelets: 290 10*3/uL (ref 150–400)
RBC: 4.39 MIL/uL (ref 3.87–5.11)
RDW: 21.9 % — ABNORMAL HIGH (ref 11.5–15.5)
WBC: 12.9 10*3/uL — ABNORMAL HIGH (ref 4.0–10.5)
nRBC: 0.2 % (ref 0.0–0.2)

## 2020-11-29 LAB — TYPE AND SCREEN
ABO/RH(D): O POS
Antibody Screen: NEGATIVE

## 2020-11-29 LAB — SARS CORONAVIRUS 2 (TAT 6-24 HRS): SARS Coronavirus 2: NEGATIVE

## 2020-11-30 ENCOUNTER — Inpatient Hospital Stay (HOSPITAL_COMMUNITY): Payer: BC Managed Care – PPO | Admitting: Anesthesiology

## 2020-11-30 ENCOUNTER — Encounter (HOSPITAL_COMMUNITY): Payer: Self-pay | Admitting: Obstetrics and Gynecology

## 2020-11-30 ENCOUNTER — Encounter (HOSPITAL_COMMUNITY)
Admission: RE | Disposition: A | Discharge status: Home / Self Care | Payer: Self-pay | Source: Home / Self Care | Attending: Obstetrics and Gynecology

## 2020-11-30 ENCOUNTER — Other Ambulatory Visit: Payer: Self-pay

## 2020-11-30 ENCOUNTER — Inpatient Hospital Stay (HOSPITAL_COMMUNITY)
Admission: RE | Admit: 2020-11-30 | Discharge: 2020-12-03 | DRG: 787 | Disposition: A | Discharge status: Home / Self Care | Payer: BC Managed Care – PPO | Attending: Obstetrics and Gynecology | Admitting: Obstetrics and Gynecology

## 2020-11-30 DIAGNOSIS — D259 Leiomyoma of uterus, unspecified: Secondary | ICD-10-CM | POA: Diagnosis present

## 2020-11-30 DIAGNOSIS — O9081 Anemia of the puerperium: Secondary | ICD-10-CM | POA: Diagnosis not present

## 2020-11-30 DIAGNOSIS — Z20822 Contact with and (suspected) exposure to covid-19: Secondary | ICD-10-CM | POA: Diagnosis present

## 2020-11-30 DIAGNOSIS — Z9889 Other specified postprocedural states: Secondary | ICD-10-CM

## 2020-11-30 DIAGNOSIS — Z3A37 37 weeks gestation of pregnancy: Secondary | ICD-10-CM | POA: Diagnosis not present

## 2020-11-30 DIAGNOSIS — O99210 Obesity complicating pregnancy, unspecified trimester: Secondary | ICD-10-CM | POA: Diagnosis present

## 2020-11-30 DIAGNOSIS — D62 Acute posthemorrhagic anemia: Secondary | ICD-10-CM | POA: Diagnosis not present

## 2020-11-30 DIAGNOSIS — O99824 Streptococcus B carrier state complicating childbirth: Secondary | ICD-10-CM | POA: Diagnosis present

## 2020-11-30 DIAGNOSIS — O3429 Maternal care due to uterine scar from other previous surgery: Principal | ICD-10-CM | POA: Diagnosis present

## 2020-11-30 DIAGNOSIS — O99214 Obesity complicating childbirth: Secondary | ICD-10-CM | POA: Diagnosis present

## 2020-11-30 DIAGNOSIS — D509 Iron deficiency anemia, unspecified: Secondary | ICD-10-CM | POA: Diagnosis present

## 2020-11-30 DIAGNOSIS — O3413 Maternal care for benign tumor of corpus uteri, third trimester: Secondary | ICD-10-CM | POA: Diagnosis present

## 2020-11-30 DIAGNOSIS — K4090 Unilateral inguinal hernia, without obstruction or gangrene, not specified as recurrent: Secondary | ICD-10-CM

## 2020-11-30 DIAGNOSIS — N979 Female infertility, unspecified: Secondary | ICD-10-CM | POA: Diagnosis present

## 2020-11-30 DIAGNOSIS — O09519 Supervision of elderly primigravida, unspecified trimester: Secondary | ICD-10-CM

## 2020-11-30 LAB — BASIC METABOLIC PANEL
Anion gap: 11 (ref 5–15)
BUN: 6 mg/dL (ref 6–20)
CO2: 18 mmol/L — ABNORMAL LOW (ref 22–32)
Calcium: 8.7 mg/dL — ABNORMAL LOW (ref 8.9–10.3)
Chloride: 106 mmol/L (ref 98–111)
Creatinine, Ser: 0.58 mg/dL (ref 0.44–1.00)
GFR, Estimated: >60 mL/min (ref >60)
Glucose, Bld: 56 mg/dL — ABNORMAL LOW (ref 70–99)
Potassium: 5.1 mmol/L (ref 3.5–5.1)
Sodium: 135 mmol/L (ref 135–145)

## 2020-11-30 LAB — RPR: RPR Ser Ql: NONREACTIVE

## 2020-11-30 SURGERY — Surgical Case
Anesthesia: Spinal

## 2020-11-30 MED ORDER — NALBUPHINE HCL 10 MG/ML IJ SOLN
5.0000 mg | Freq: Once | INTRAMUSCULAR | Status: DC | PRN
Start: 2020-11-30 — End: 2020-12-03

## 2020-11-30 MED ORDER — OXYTOCIN-SODIUM CHLORIDE 30-0.9 UT/500ML-% IV SOLN: 2.5000 [IU]/h | INTRAVENOUS | Status: AC

## 2020-11-30 MED ORDER — MORPHINE SULFATE (PF) 0.5 MG/ML IJ SOLN
INTRAMUSCULAR | Status: DC | PRN
  Administered 2020-11-30: 150 ug via INTRATHECAL

## 2020-11-30 MED ORDER — IBUPROFEN 600 MG PO TABS
600.0000 mg | ORAL_TABLET | Freq: Four times a day (QID) | ORAL | Status: DC
  Administered 2020-12-01 – 2020-12-03 (×9): 600 mg via ORAL
  Filled 2020-11-30 (×9): qty 1

## 2020-11-30 MED ORDER — PROMETHAZINE HCL 25 MG/ML IJ SOLN: 6.2500 mg | INTRAMUSCULAR | Status: DC | PRN

## 2020-11-30 MED ORDER — NALBUPHINE HCL 10 MG/ML IJ SOLN: 5.0000 mg | Freq: Once | INTRAMUSCULAR | Status: DC | PRN

## 2020-11-30 MED ORDER — SODIUM CHLORIDE 0.9% FLUSH: 3.0000 mL | INTRAVENOUS | Status: DC | PRN

## 2020-11-30 MED ORDER — OXYCODONE HCL 5 MG PO TABS
5.0000 mg | ORAL_TABLET | ORAL | Status: DC | PRN
  Administered 2020-12-01: 5 mg via ORAL
  Filled 2020-11-30: qty 1

## 2020-11-30 MED ORDER — SENNOSIDES-DOCUSATE SODIUM 8.6-50 MG PO TABS
2.0000 | ORAL_TABLET | Freq: Every day | ORAL | Status: DC
  Administered 2020-12-01 – 2020-12-02 (×2): 2 via ORAL
  Filled 2020-11-30 (×3): qty 2

## 2020-11-30 MED ORDER — DEXAMETHASONE SODIUM PHOSPHATE 4 MG/ML IJ SOLN
INTRAMUSCULAR | Status: AC
  Filled 2020-11-30: qty 2

## 2020-11-30 MED ORDER — PRENATAL MULTIVITAMIN CH
1.0000 | ORAL_TABLET | Freq: Every day | ORAL | Status: DC
  Administered 2020-12-01 – 2020-12-03 (×3): 1 via ORAL
  Filled 2020-11-30 (×3): qty 1

## 2020-11-30 MED ORDER — DEXAMETHASONE SODIUM PHOSPHATE 4 MG/ML IJ SOLN
INTRAMUSCULAR | Status: DC | PRN
  Administered 2020-11-30: 8 mg via INTRAVENOUS

## 2020-11-30 MED ORDER — SCOPOLAMINE 1 MG/3DAYS TD PT72
1.0000 | MEDICATED_PATCH | Freq: Once | TRANSDERMAL | Status: AC
  Administered 2020-11-30: 1.5 mg via TRANSDERMAL

## 2020-11-30 MED ORDER — LACTATED RINGERS IV SOLN: INTRAVENOUS | Status: DC

## 2020-11-30 MED ORDER — NALOXONE HCL 0.4 MG/ML IJ SOLN: 0.4000 mg | INTRAMUSCULAR | Status: DC | PRN

## 2020-11-30 MED ORDER — TRANEXAMIC ACID-NACL 1000-0.7 MG/100ML-% IV SOLN
INTRAVENOUS | Status: DC | PRN
  Administered 2020-11-30: 1000 mg via INTRAVENOUS

## 2020-11-30 MED ORDER — KETOROLAC TROMETHAMINE 30 MG/ML IJ SOLN
INTRAMUSCULAR | Status: AC
  Filled 2020-11-30: qty 1

## 2020-11-30 MED ORDER — METOCLOPRAMIDE HCL 5 MG/ML IJ SOLN
INTRAMUSCULAR | Status: AC
  Filled 2020-11-30: qty 2

## 2020-11-30 MED ORDER — ORAL CARE MOUTH RINSE: 15.0000 mL | Freq: Once | OROMUCOSAL | Status: AC

## 2020-11-30 MED ORDER — LACTATED RINGERS IV SOLN: INTRAVENOUS | Status: DC | PRN

## 2020-11-30 MED ORDER — PHENYLEPHRINE 40 MCG/ML (10ML) SYRINGE FOR IV PUSH (FOR BLOOD PRESSURE SUPPORT)
PREFILLED_SYRINGE | INTRAVENOUS | Status: DC | PRN
  Administered 2020-11-30: 80 ug via INTRAVENOUS
  Administered 2020-11-30: 120 ug via INTRAVENOUS

## 2020-11-30 MED ORDER — KETOROLAC TROMETHAMINE 30 MG/ML IJ SOLN
30.0000 mg | Freq: Four times a day (QID) | INTRAMUSCULAR | Status: AC | PRN
  Filled 2020-11-30: qty 1

## 2020-11-30 MED ORDER — OXYCODONE HCL 5 MG/5ML PO SOLN: 5.0000 mg | Freq: Once | ORAL | Status: DC | PRN

## 2020-11-30 MED ORDER — TETANUS-DIPHTH-ACELL PERTUSSIS 5-2.5-18.5 LF-MCG/0.5 IM SUSY: 0.5000 mL | PREFILLED_SYRINGE | Freq: Once | INTRAMUSCULAR | Status: DC

## 2020-11-30 MED ORDER — ACETAMINOPHEN 325 MG PO TABS: 650.0000 mg | ORAL_TABLET | Freq: Four times a day (QID) | ORAL | Status: DC | PRN

## 2020-11-30 MED ORDER — OXYTOCIN-SODIUM CHLORIDE 30-0.9 UT/500ML-% IV SOLN
INTRAVENOUS | Status: AC
  Filled 2020-11-30: qty 500

## 2020-11-30 MED ORDER — FENTANYL CITRATE (PF) 100 MCG/2ML IJ SOLN
25.0000 ug | INTRAMUSCULAR | Status: DC | PRN
  Administered 2020-11-30: 50 ug via INTRAVENOUS

## 2020-11-30 MED ORDER — PHENYLEPHRINE HCL-NACL 20-0.9 MG/250ML-% IV SOLN
INTRAVENOUS | Status: AC
  Filled 2020-11-30: qty 250

## 2020-11-30 MED ORDER — ACETAMINOPHEN 10 MG/ML IV SOLN
INTRAVENOUS | Status: DC | PRN
  Administered 2020-11-30: 1000 mg via INTRAVENOUS

## 2020-11-30 MED ORDER — CHLORHEXIDINE GLUCONATE 0.12 % MT SOLN
OROMUCOSAL | Status: AC
  Filled 2020-11-30: qty 15

## 2020-11-30 MED ORDER — OXYTOCIN-SODIUM CHLORIDE 30-0.9 UT/500ML-% IV SOLN
INTRAVENOUS | Status: DC | PRN
  Administered 2020-11-30: 300 mL via INTRAVENOUS

## 2020-11-30 MED ORDER — SIMETHICONE 80 MG PO CHEW
80.0000 mg | CHEWABLE_TABLET | Freq: Three times a day (TID) | ORAL | Status: DC
  Administered 2020-11-30 – 2020-12-03 (×7): 80 mg via ORAL
  Filled 2020-11-30 (×7): qty 1

## 2020-11-30 MED ORDER — SOD CITRATE-CITRIC ACID 500-334 MG/5ML PO SOLN
ORAL | Status: AC
  Filled 2020-11-30: qty 30

## 2020-11-30 MED ORDER — PHENYLEPHRINE HCL-NACL 20-0.9 MG/250ML-% IV SOLN
INTRAVENOUS | Status: DC | PRN
  Administered 2020-11-30: 60 ug/min via INTRAVENOUS

## 2020-11-30 MED ORDER — MORPHINE SULFATE (PF) 0.5 MG/ML IJ SOLN
INTRAMUSCULAR | Status: AC
  Filled 2020-11-30: qty 10

## 2020-11-30 MED ORDER — ENOXAPARIN SODIUM 60 MG/0.6ML IJ SOSY
50.0000 mg | PREFILLED_SYRINGE | INTRAMUSCULAR | Status: DC
  Administered 2020-12-01 – 2020-12-02 (×2): 50 mg via SUBCUTANEOUS
  Filled 2020-11-30 (×2): qty 0.6

## 2020-11-30 MED ORDER — KETOROLAC TROMETHAMINE 30 MG/ML IJ SOLN: 30.0000 mg | Freq: Four times a day (QID) | INTRAMUSCULAR | Status: AC | PRN

## 2020-11-30 MED ORDER — POVIDONE-IODINE 10 % EX SWAB
2.0000 "application " | Freq: Once | CUTANEOUS | Status: AC
  Administered 2020-11-30: 2 via TOPICAL

## 2020-11-30 MED ORDER — DIPHENHYDRAMINE HCL 25 MG PO CAPS: 25.0000 mg | ORAL_CAPSULE | ORAL | Status: DC | PRN

## 2020-11-30 MED ORDER — MEPERIDINE HCL 25 MG/ML IJ SOLN: 6.2500 mg | INTRAMUSCULAR | Status: DC | PRN

## 2020-11-30 MED ORDER — SOD CITRATE-CITRIC ACID 500-334 MG/5ML PO SOLN
30.0000 mL | Freq: Once | ORAL | Status: AC
  Administered 2020-11-30: 30 mL via ORAL

## 2020-11-30 MED ORDER — CEFAZOLIN SODIUM-DEXTROSE 2-4 GM/100ML-% IV SOLN
INTRAVENOUS | Status: AC
  Filled 2020-11-30: qty 100

## 2020-11-30 MED ORDER — ONDANSETRON HCL 4 MG/2ML IJ SOLN
INTRAMUSCULAR | Status: DC | PRN
  Administered 2020-11-30: 4 mg via INTRAVENOUS

## 2020-11-30 MED ORDER — DIPHENHYDRAMINE HCL 50 MG/ML IJ SOLN
12.5000 mg | INTRAMUSCULAR | Status: DC | PRN
Start: 2020-11-30 — End: 2020-12-03

## 2020-11-30 MED ORDER — BUPIVACAINE IN DEXTROSE 0.75-8.25 % IT SOLN
INTRATHECAL | Status: DC | PRN
  Administered 2020-11-30: 1.6 mL via INTRATHECAL

## 2020-11-30 MED ORDER — FAMOTIDINE 20 MG PO TABS
ORAL_TABLET | ORAL | Status: AC
  Filled 2020-11-30: qty 1

## 2020-11-30 MED ORDER — ONDANSETRON HCL 4 MG/2ML IJ SOLN: 4.0000 mg | Freq: Three times a day (TID) | INTRAMUSCULAR | Status: DC | PRN

## 2020-11-30 MED ORDER — FENTANYL CITRATE (PF) 100 MCG/2ML IJ SOLN
INTRAMUSCULAR | Status: AC
  Filled 2020-11-30: qty 2

## 2020-11-30 MED ORDER — SIMETHICONE 80 MG PO CHEW: 80.0000 mg | CHEWABLE_TABLET | ORAL | Status: DC | PRN

## 2020-11-30 MED ORDER — WITCH HAZEL-GLYCERIN EX PADS: 1.0000 "application " | MEDICATED_PAD | CUTANEOUS | Status: DC | PRN

## 2020-11-30 MED ORDER — SCOPOLAMINE 1 MG/3DAYS TD PT72: 1.0000 | MEDICATED_PATCH | Freq: Once | TRANSDERMAL | Status: DC

## 2020-11-30 MED ORDER — NALBUPHINE HCL 10 MG/ML IJ SOLN: 5.0000 mg | INTRAMUSCULAR | Status: DC | PRN

## 2020-11-30 MED ORDER — SODIUM CHLORIDE 0.9 % IV SOLN
25.0000 mg | Freq: Four times a day (QID) | INTRAVENOUS | Status: DC | PRN
  Administered 2020-11-30: 25 mg via INTRAVENOUS
  Filled 2020-11-30 (×2): qty 1

## 2020-11-30 MED ORDER — DIBUCAINE (PERIANAL) 1 % EX OINT: 1.0000 "application " | TOPICAL_OINTMENT | CUTANEOUS | Status: DC | PRN

## 2020-11-30 MED ORDER — MENTHOL 3 MG MT LOZG: 1.0000 | LOZENGE | OROMUCOSAL | Status: DC | PRN

## 2020-11-30 MED ORDER — SODIUM CHLORIDE 0.9 % IR SOLN
Status: DC | PRN
  Administered 2020-11-30: 1

## 2020-11-30 MED ORDER — CHLORHEXIDINE GLUCONATE 0.12 % MT SOLN
15.0000 mL | Freq: Once | OROMUCOSAL | Status: AC
  Administered 2020-11-30: 15 mL via OROMUCOSAL

## 2020-11-30 MED ORDER — PHENYLEPHRINE 40 MCG/ML (10ML) SYRINGE FOR IV PUSH (FOR BLOOD PRESSURE SUPPORT)
PREFILLED_SYRINGE | INTRAVENOUS | Status: AC
  Filled 2020-11-30: qty 10

## 2020-11-30 MED ORDER — ONDANSETRON HCL 4 MG/2ML IJ SOLN
INTRAMUSCULAR | Status: AC
  Filled 2020-11-30: qty 2

## 2020-11-30 MED ORDER — NALOXONE HCL 4 MG/10ML IJ SOLN
1.0000 ug/kg/h | INTRAVENOUS | Status: DC | PRN
  Filled 2020-11-30: qty 5

## 2020-11-30 MED ORDER — TRANEXAMIC ACID-NACL 1000-0.7 MG/100ML-% IV SOLN
INTRAVENOUS | Status: AC
  Filled 2020-11-30: qty 100

## 2020-11-30 MED ORDER — FENTANYL CITRATE (PF) 100 MCG/2ML IJ SOLN
INTRAMUSCULAR | Status: DC | PRN
  Administered 2020-11-30: 15 ug via INTRATHECAL

## 2020-11-30 MED ORDER — FAMOTIDINE 20 MG PO TABS
20.0000 mg | ORAL_TABLET | Freq: Once | ORAL | Status: AC
  Administered 2020-11-30: 20 mg via ORAL

## 2020-11-30 MED ORDER — SCOPOLAMINE 1 MG/3DAYS TD PT72
MEDICATED_PATCH | TRANSDERMAL | Status: AC
  Filled 2020-11-30: qty 1

## 2020-11-30 MED ORDER — CEFAZOLIN SODIUM-DEXTROSE 2-4 GM/100ML-% IV SOLN
2.0000 g | INTRAVENOUS | Status: AC
  Administered 2020-11-30: 2 g via INTRAVENOUS

## 2020-11-30 MED ORDER — OXYCODONE HCL 5 MG PO TABS
5.0000 mg | ORAL_TABLET | Freq: Once | ORAL | Status: DC | PRN
Start: 2020-11-30 — End: 2020-11-30

## 2020-11-30 MED ORDER — COCONUT OIL OIL: 1.0000 "application " | TOPICAL_OIL | Status: DC | PRN

## 2020-11-30 SURGICAL SUPPLY — 38 items
APL SKNCLS STERI-STRIP NONHPOA (GAUZE/BANDAGES/DRESSINGS) ×1
BENZOIN TINCTURE PRP APPL 2/3 (GAUZE/BANDAGES/DRESSINGS) ×2 IMPLANT
CHLORAPREP W/TINT 26ML (MISCELLANEOUS) ×2 IMPLANT
CLAMP CORD UMBIL (MISCELLANEOUS) IMPLANT
CLOSURE STERI STRIP 1/2 X4 (GAUZE/BANDAGES/DRESSINGS) ×1 IMPLANT
CLOTH BEACON ORANGE TIMEOUT ST (SAFETY) ×2 IMPLANT
DRSG OPSITE POSTOP 4X10 (GAUZE/BANDAGES/DRESSINGS) ×2 IMPLANT
ELECT REM PT RETURN 9FT ADLT (ELECTROSURGICAL) ×2
ELECTRODE REM PT RTRN 9FT ADLT (ELECTROSURGICAL) ×1 IMPLANT
EXTRACTOR VACUUM M CUP 4 TUBE (SUCTIONS) IMPLANT
GLOVE BIO SURGEON STRL SZ7.5 (GLOVE) ×2 IMPLANT
GLOVE BIOGEL PI IND STRL 7.0 (GLOVE) ×1 IMPLANT
GLOVE BIOGEL PI IND STRL 7.5 (GLOVE) ×1 IMPLANT
GLOVE BIOGEL PI INDICATOR 7.0 (GLOVE) ×1
GLOVE BIOGEL PI INDICATOR 7.5 (GLOVE) ×1
GOWN STRL REUS W/TWL LRG LVL3 (GOWN DISPOSABLE) ×4 IMPLANT
KIT ABG SYR 3ML LUER SLIP (SYRINGE) IMPLANT
NDL HYPO 25X5/8 SAFETYGLIDE (NEEDLE) IMPLANT
NEEDLE HYPO 25X5/8 SAFETYGLIDE (NEEDLE) IMPLANT
NS IRRIG 1000ML POUR BTL (IV SOLUTION) ×2 IMPLANT
PACK C SECTION WH (CUSTOM PROCEDURE TRAY) ×2 IMPLANT
PAD OB MATERNITY 4.3X12.25 (PERSONAL CARE ITEMS) ×2 IMPLANT
PENCIL SMOKE EVAC W/HOLSTER (ELECTROSURGICAL) ×2 IMPLANT
RTRCTR C-SECT PINK 25CM LRG (MISCELLANEOUS) ×2 IMPLANT
STRIP CLOSURE SKIN 1/2X4 (GAUZE/BANDAGES/DRESSINGS) ×2 IMPLANT
SUT CHROMIC 2 0 CT 1 (SUTURE) ×2 IMPLANT
SUT MNCRL AB 3-0 PS2 27 (SUTURE) ×2 IMPLANT
SUT PDS AB 0 CTX 60 (SUTURE) ×2 IMPLANT
SUT PLAIN 2 0 XLH (SUTURE) ×2 IMPLANT
SUT VIC AB 0 CT1 36 (SUTURE) ×2 IMPLANT
SUT VIC AB 0 CTX 36 (SUTURE) ×6
SUT VIC AB 0 CTX36XBRD ANBCTRL (SUTURE) ×3 IMPLANT
SUT VIC AB 2-0 SH 27 (SUTURE)
SUT VIC AB 2-0 SH 27XBRD (SUTURE) IMPLANT
SUT VIC AB 4-0 KS 27 (SUTURE) ×1 IMPLANT
TOWEL OR 17X24 6PK STRL BLUE (TOWEL DISPOSABLE) ×2 IMPLANT
TRAY FOLEY W/BAG SLVR 14FR LF (SET/KITS/TRAYS/PACK) ×2 IMPLANT
WATER STERILE IRR 1000ML POUR (IV SOLUTION) ×2 IMPLANT

## 2020-11-30 NOTE — Op Note (Signed)
Cesarean Section Procedure Note  Indications: P0 at 37wks presenting for scheduled c-section secondary to h/o myomectomy.  She also has a h/o bowel resection and 2 hernia repairs with mesh.  Pre-operative Diagnosis: 1.37wks 2.HISTORY OF ABDOMINAL MYOMECTOMY   Post-operative Diagnosis: 1.37wks 2.HISTORY OF ABDOMINAL MYOMECTOMY  Procedure: PRIMARY LOW TRANSVERSE CESAREAN SECTION  Surgeon: Everett Graff, MD    Assistants: Dr. Gwynneth Munson Dillard 1st assist Burman Foster, CNM 2nd assist  Anesthesia: Regional  Anesthesiologist: Audry Pili, MD   Procedure Details  The patient was taken to the operating room secondary to h/o myomectomy after the risks, benefits, complications, treatment options, and expected outcomes were discussed with the patient.  The patient concurred with the proposed plan, giving informed consent which was signed and witnessed. The patient was taken to Operating Room B, identified as [name] and the procedure verified as C-Section Delivery. A Time Out was held and the above information confirmed.  After induction of anesthesia by obtaining a spinal, the patient was prepped and draped in the usual sterile manner. A Pfannenstiel skin incision was made and carried down through the subcutaneous tissue to the underlying layer of fascia.  The fascia was incised bilaterally and extended transversely bilaterally with the Mayo scissors. Kocher clamps were placed on the inferior aspect of the fascial incision and the underlying rectus muscle was separated from the fascia.  Permanent suture was noted.  The same was done on the superior aspect of the fascial incision.  The peritoneum was identified, entered bluntly and extended sharply and manually with mesh noted superiorly.  An Alexis self-retaining retractor was placed.  The utero-vesical peritoneal reflection was incised transversely and the bladder flap was bluntly freed from the lower uterine segment. A low transverse  uterine incision was made with the scalpel and extended bilaterally with the bandage scissors.  The infant was delivered in vertex position without difficulty.  Delayed cord clamping for one minute was noted and baby cried throughout the entire time.  After the umbilical cord was clamped and cut, the infant was handed to the awaiting pediatricians and the baby at that time was noted to be very wet with gurgling. Cord blood was obtained for evaluation.  The placenta was removed intact and appeared to be within normal limits. The uterus was cleared of all clots and debris. The uterine incision was closed with running interlocking sutures of 0 Vicryl and a second imbricating layer was performed as well.   Bilateral tubes and ovaries appeared to be within normal limits but the right ovary appeared attenuated.  Good hemostasis was noted.  Copious irrigation was performed until clear.  The peritoneum was repaired with 0 PDS via a running suture in order to give support to the mesh.  The fascia was reapproximated with a running suture of 0 PDS.  The skin was reapproximated with a subcuticular suture of 3-0 monocryl.  Steristrips were applied.  Instrument, sponge, and needle counts were correct prior to abdominal closure and at the conclusion of the case.  The patient was awaiting transfer to the recovery room in good condition.  Findings: Live female infant with Apgars 8 at one minute and 8 at five minutes.  Normal appearing bilateral ovaries and fallopian tubes were noted.  Baby went to NICU for transitioning.  Estimated Blood Loss:  829 ml         Drains: foley to gravity 100 cc         Total IV Fluids: 2300 ml  Specimens to Pathology: Placenta         Complications:  None; patient tolerated the procedure well.         Disposition: PACU - hemodynamically stable.         Condition: stable  Attending Attestation: I performed the procedure.  I was present and scrubbed and the assistant was  required due to complexity of anatomy.

## 2020-11-30 NOTE — Transfer of Care (Signed)
Immediate Anesthesia Transfer of Care Note  Patient: [name]  Procedure(s) Performed: CESAREAN SECTION  Patient Location: PACU  Anesthesia Type:Spinal  Level of Consciousness: awake  Airway & Oxygen Therapy: Patient Spontanous Breathing  Post-op Assessment: Report given to RN  Post vital signs: Reviewed and stable  Last Vitals:  Vitals Value Taken Time  BP 93/61 11/30/20 1715  Temp    Pulse 71 11/30/20 1716  Resp 19 11/30/20 1716  SpO2 100 % 11/30/20 1716  Vitals shown include unvalidated device data.  Last Pain:  Vitals:   11/30/20 1323  TempSrc: Oral  PainSc: 0-No pain      Patients Stated Pain Goal: 4 (80/32/12 2482)  Complications: No notable events documented.

## 2020-11-30 NOTE — Anesthesia Preprocedure Evaluation (Addendum)
Anesthesia Evaluation  Patient identified by MRN, date of birth, ID band Patient awake    Reviewed: Allergy & Precautions, NPO status , Patient's Chart, lab work & pertinent test results  History of Anesthesia Complications Negative for: history of anesthetic complications  Airway Mallampati: II   Neck ROM: Full    Dental   Pulmonary neg pulmonary ROS,    Pulmonary exam normal        Cardiovascular negative cardio ROS Normal cardiovascular exam     Neuro/Psych negative neurological ROS  negative psych ROS   GI/Hepatic Neg liver ROS,  Crohn's disease    Endo/Other  Morbid obesity  Renal/GU negative Renal ROS     Musculoskeletal negative musculoskeletal ROS (+)   Abdominal   Peds  Hematology negative hematology ROS (+)  Plt 290k    Anesthesia Other Findings Covid test negative   Reproductive/Obstetrics (+) Pregnancy                            Anesthesia Physical Anesthesia Plan  ASA: 3  Anesthesia Plan: Spinal   Post-op Pain Management:    Induction:   PONV Risk Score and Plan: 2 and Treatment may vary due to age or medical condition, Ondansetron and Scopolamine patch - Pre-op  Airway Management Planned: Natural Airway  Additional Equipment: None  Intra-op Plan:   Post-operative Plan:   Informed Consent: I have reviewed the patients History and Physical, chart, labs and discussed the procedure including the risks, benefits and alternatives for the proposed anesthesia with the patient or authorized representative who has indicated his/her understanding and acceptance.       Plan Discussed with: CRNA and Anesthesiologist  Anesthesia Plan Comments: (Labs reviewed, platelets acceptable. Discussed risks and benefits of spinal, including spinal/epidural hematoma, infection, failed block, and PDPH. Patient expressed understanding and wished to proceed. )        Anesthesia Quick Evaluation

## 2020-11-30 NOTE — H&P (Addendum)
OB ADMISSION/ HISTORY & PHYSICAL:  Admission Date: 11/30/2020  1:02 PM  Admit Diagnosis: Primary cesarean section, hx of myomectomy, multiple hernia repairs w/ mesh  [name] is a 40 y.o. female G1P0000 57w0dpresenting for primary cesarean section w/ hx of myomectomy. Endorses active FM, denies LOF and vaginal bleeding.  History of current pregnancy: G1P0000   Patient entered care with [facility] at 10 wks.   ED[facility] 12/21/20 [facility] Anatomy scan:  20 wks, incomplete w/ anterior placenta. F/U anatomy complete @ 29 wks  Antenatal testing: for BMI started at 34 weeks Last evaluation: 36  wks Vertex/ Anterior placenta/ BPP 8/8/ AFI 15.3  Significant prenatal events: AMA  Iron deficiency anemia Leiomyoma of uterus Hx of inguinal hernia  Prenatal Labs: ABO, Rh: --/--/O POS (07/05 1040) Antibody: NEG (07/05 1040) Rubella: Immune (12/21 0000)  RPR: NON REACTIVE (07/05 1037)  HBsAg: Negative (12/21 0000)  HIV: Non-reactive (12/21 0000)  GTT: passed GBS:   positive GC/CHL: neg/neg Genetics: low-risk Tdap/influenza vaccines: UTD on tdap and flu   OB History  Gravida Para Term Preterm AB Living  1 0 0 0 0 0  SAB IAB Ectopic Multiple Live Births  0 0 0 0 0    # Outcome Date GA Lbr Len/2nd Weight Sex Delivery Anes PTL Lv  1 Current             Medical / Surgical History: Past medical history:  Past Medical History:  Diagnosis Date   Abrasion of upper arm with infection, left, sequela    started antibiotics lef arm oct 4th started antibiotics healing well   Anemia    hx of 2 yrs ago   Chicken pox    Chronic rhinitis    Contact dermatitis and other eczema, due to unspecified cause    Crohn's 11-15-11   no issues in 2 yrs   Fibroids 11-15-11   remains with fibroids   Hernia    Incisional hernia, RLQ 10/02/2011   Infertility, female    Mononucleosis    Obesity    Pilonidal abscess    Rectal abscess    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[place]     Vitamin D deficiency     Past surgical history:  Past Surgical History:  Procedure Laterality Date   APPENDECTOMY  7/06   COLONOSCOPY WITH PROPOFOL N/A 03/07/2017   Procedure: COLONOSCOPY WITH PROPOFOL;  Surgeon: JMilus Banister MD;  Location: [facility];  Service: Endoscopy;  Laterality: N/A;   EYE SURGERY  11-15-11   2003-multiple stys   ileocecal resection  1/11   crohns disease with fisula/stricture. Dr. ARonnald Collum  INCISIONAL HERNIA REPAIR N/A 12/24/2018   Procedure: OPEN REPAIR VENTRAL INCISIONAL HERNIA WITH MESH PATCH AND PRIMARY REPAIR OF LEFT SPIGELIAN HERNIA;  Surgeon: GArmandina Gemma MD;  Location: [facility];  Service: General;  Laterality: N/A;   KNEE ARTHROSCOPY  99, 02   Bil.   MYOMECTOMY N/A 11/10/2014   Procedure: MYOMECTOMY;  Surgeon: AEverett Graff MD;  [facility];  Service: Gynecology;  Laterality: N/A;   PILONIDAL CYST EXCISION  2011, 2012   I&Ds   VENTRAL HERNIA REPAIR  11/27/2011   Procedure: LAPAROSCOPIC VENTRAL HERNIA;  Surgeon: SAdin Hector MD;  Location: [facility];  Service: General;  Laterality: N/A;  Laparoscopic Exploration & Repair of Hernia in Abdomen   WISDOM TOOTH EXTRACTION     Family History:  Family History  Problem Relation Age of Onset  Aneurysm Mother        brain   Heart disease Mother 75       heart anyrism   Healthy Father        fairly   Diabetes Sister        half sister   Bipolar disorder Sister        half sister   Colon cancer Maternal Grandmother    Stomach cancer Paternal Grandfather    Multiple sclerosis Sister     Social History:  reports that she has never smoked. She has never used smokeless tobacco. She reports that she does not drink alcohol and does not use drugs.  Allergies: Patient has no known allergies.   Current Medications at time of admission:  Prior to Admission medications   Medication Sig Start Date End Date Taking? Authorizing Provider  Calcium Carb-Cholecalciferol (CALCIUM/VITAMIN D) 600-400  MG-UNIT TABS Take 1 tablet by mouth daily.   Yes [provider]  ferrous sulfate 325 (65 FE) MG tablet Take 325 mg by mouth every other day.   Yes [provider]  fexofenadine (ALLEGRA) 180 MG tablet Take 180 mg by mouth daily.   Yes [provider]  fluticasone (FLONASE) 50 MCG/ACT nasal spray Place 1 spray into both nostrils daily.   Yes [provider]  InFLIXimab (REMICADE IV) Inject into the vein every 8 (eight) weeks.    Yes [provider]  Prenatal Vit-Fe Fumarate-FA (PRENATAL MULTIVITAMIN) TABS tablet Take 1 tablet by mouth daily at 12 noon.   Yes [provider]  Vitamin D, Cholecalciferol, 50 MCG (2000 UT) CAPS Take 2,000 Units by mouth daily.   Yes [provider]    Review of Systems: Constitutional: Negative   HENT: Negative   Eyes: Negative   Respiratory: Negative   Cardiovascular: Negative   Gastrointestinal: Negative  Genitourinary: neg for bloody show, neg for LOF   Musculoskeletal: Negative   Skin: Negative   Neurological: Negative   Endo/Heme/Allergies: Negative   Psychiatric/Behavioral: Negative    Physical Exam: VS: Blood pressure 116/81, pulse 89, temperature 98.3 F (36.8 C), temperature source Oral, resp. rate 18, height 5' 2"  (1.575 m), weight 108 kg, last menstrual period 03/01/2020, SpO2 100 %. AAO x3, no signs of distress Cardiovascular: RRR Respiratory: Lung fields clear to ausculation GU/GI: Abdomen gravid, non-tender, non-distended, active FM, vertex Extremities: negative for pain, tenderness, and cords  Cervical exam: deferred FHR: 165 TOCO: deferred   Prenatal Transfer Tool  Maternal Diabetes: No Genetic Screening: Normal Maternal Ultrasounds/Referrals: Normal Fetal Ultrasounds or other Referrals:  None Maternal Substance Abuse:  No Significant Maternal Medications:  None Significant Maternal Lab Results: Group B Strep positive    Assessment: 40 y.o. G1P0000 27w0dIVF  preg Primary C/S for hx of multiple abd surgeries for hernia repairs and hx of myomectomy  GBS pos Pain management plan: per anesthesia  Plan:  Admit to L&D OR Routine pre-op admission orders  Dr RMancel Balenotified of admission and plan of care  VArrie EasternMSN, CNM 11/30/2020 1:28 PM  Risks benefits alternatives reviewed with patient including but not limited to bleeding infection and injury.  Pt and husband had no questions.  Consent signed and witnessed.

## 2020-11-30 NOTE — Anesthesia Procedure Notes (Signed)
Spinal  Patient location during procedure: OR Start time: 11/30/2020 3:14 PM End time: 11/30/2020 3:19 PM Reason for block: surgical anesthesia Staffing Performed: anesthesiologist  Anesthesiologist: Audry Pili, MD Preanesthetic Checklist Completed: patient identified, IV checked, risks and benefits discussed, surgical consent, monitors and equipment checked, pre-op evaluation and timeout performed Spinal Block Patient position: sitting Prep: DuraPrep Patient monitoring: heart rate, cardiac monitor, continuous pulse ox and blood pressure Approach: midline Location: L3-4 Injection technique: single-shot Needle Needle type: Pencan  Needle gauge: 24 G Assessment Events: paresthesia and RLE paresthesia on initial attempt. Needle withdrawn, paresthesia resolved. 2nd attempt without paresthesia Additional Notes Consent was obtained prior to the procedure with all questions answered and concerns addressed. Risks including, but not limited to, bleeding, infection, nerve damage, paralysis, failed block, inadequate analgesia, allergic reaction, high spinal, itching, and headache were discussed and the patient wished to proceed. Functioning IV was confirmed and monitors were applied. Sterile prep and drape, including hand hygiene, mask, and sterile gloves were used. The patient was positioned and the spine was prepped. The skin was anesthetized with lidocaine. Free flow of clear CSF was obtained prior to injecting local anesthetic into the CSF. The spinal needle aspirated freely following injection. The needle was carefully withdrawn. The patient tolerated the procedure well.   Donna Don, MD

## 2020-11-30 NOTE — Interval H&P Note (Signed)
History and Physical Interval Note:  11/30/2020 2:59 PM  [name]  has presented today for surgery, with the diagnosis of HISTORY OF ABDOMINAL MYOMECTOMY.  The various methods of treatment have been discussed with the patient and family. After consideration of risks, benefits and other options for treatment, the patient has consented to  Procedure(s): CESAREAN SECTION (N/A) as a surgical intervention.  The patient's history has been reviewed, patient examined, no change in status, stable for surgery.  I have reviewed the patient's chart and labs.  Questions were answered to the patient's satisfaction.     Delice Lesch

## 2020-12-01 ENCOUNTER — Encounter (HOSPITAL_COMMUNITY): Payer: Self-pay | Admitting: Obstetrics and Gynecology

## 2020-12-01 LAB — CBC
HCT: 30.1 % — ABNORMAL LOW (ref 36.0–46.0)
Hemoglobin: 9.7 g/dL — ABNORMAL LOW (ref 12.0–15.0)
MCH: 27.5 pg (ref 26.0–34.0)
MCHC: 32.2 g/dL (ref 30.0–36.0)
MCV: 85.3 fL (ref 80.0–100.0)
Platelets: 247 10*3/uL (ref 150–400)
RBC: 3.53 MIL/uL — ABNORMAL LOW (ref 3.87–5.11)
RDW: 21.5 % — ABNORMAL HIGH (ref 11.5–15.5)
WBC: 16.4 10*3/uL — ABNORMAL HIGH (ref 4.0–10.5)
nRBC: 0 % (ref 0.0–0.2)

## 2020-12-01 MED ORDER — LACTATED RINGERS IV SOLN: INTRAVENOUS | Status: DC

## 2020-12-01 NOTE — Progress Notes (Signed)
Patient screened out for psychosocial assessment since none of the following apply: °Psychosocial stressors documented in mother or baby's chart °Gestation less than 32 weeks °Code at delivery  °Infant with anomalies °Please contact the Clinical Social Worker if specific needs arise, by MOB's request, or if MOB scores greater than 9/yes to question 10 on Edinburgh Postpartum Depression Screen. ° °Jumaane Weatherford Boyd-Gilyard, MSW, LCSW °Clinical Social Work °[phone] °  °

## 2020-12-01 NOTE — Progress Notes (Signed)
Subjective: POD# 1 Information for the patient's newborn:  Lorayne, Getchell [[id number]]  female   Baby' Girl in NICU  Reports feeling "good, ready to go see my baby" Feeding: breast Reports tolerating PO and denies N/V, foley removed, ambulating and urinating w/o difficulty  Pain controlled with acetaminophen and ibuprofen (OTC) Denies HA/SOB/dizziness  Flatus present Vaginal bleeding is normal, no clots     Objective:  VS:  Vitals:   11/30/20 2034 12/01/20 0110 12/01/20 0507 12/01/20 0910  BP: 101/60 (!) 91/50 (!) 95/49 (!) 101/51  Pulse: 63 (!) 58 69 70  Resp: 16 14 16 17   Temp: 97.9 F (36.6 C) (!) 97.5 F (36.4 C) 97.9 F (36.6 C) 98.1 F (36.7 C)  TempSrc: Oral Oral Oral Oral  SpO2: 100% 97% 97% 97%  Weight:      Height:        Intake/Output Summary (Last 24 hours) at 12/01/2020 1121 Last data filed at 12/01/2020 0915 Gross per 24 hour  Intake 2000 ml  Output 1952 ml  Net 48 ml     Recent Labs    11/29/20 1037 12/01/20 0501  WBC 12.9* 16.4*  HGB 11.6* 9.7*  HCT 37.8 30.1*  PLT 290 247    Blood type: --/--/O POS (07/05 1040) Rubella: Immune (12/21 0000)    Physical Exam:  General: alert, cooperative, and no distress CV: Regular rate and rhythm Resp: clear Abdomen: soft, nontender, normal bowel sounds Incision: clean, dry, and intact Perineum:  Uterine Fundus: firm, below umbilicus, nontender Lochia: minimal Ext: extremities normal, atraumatic, no cyanosis or edema   Assessment/Plan: 40 y.o.   POD# 1. G1P1001                  Active Problems:   Obesity affecting pregnancy   Hernia, inguinal   IDA (iron deficiency anemia)   Female infertility   AMA (advanced maternal age) primigravida 35+   History of myomectomy   Routine post-op PP care          Advance diet as tolerated Advised warm fluids and ambulation to improve GI motility Encourage rest when baby rests Breastfeeding support Dr Alesia Richards aware of pt status and [place], MSN, CNM 12/01/2020, 11:21 AM

## 2020-12-01 NOTE — Lactation Note (Addendum)
This note was copied from a baby's chart. Lactation Consultation Note  Patient Name: [name] Date: 12/01/2020 Reason for consult: 1st time breastfeeding;Primapara;NICU baby;Early term 37-38.6wks;Initial assessment;Other (Comment) (IVF) Age:38 hours  Visited with mom of 31 hours old ETI female, she's a P1 and reported moderate breast changes during the pregnancy (mainly pigmentation). This is mom's & dad only baby, she was conceived through IVF.  LC assisted with hand expression and pumping, she started pumping this morning and already getting small droplets of colostrum, praised her for her efforts.  Reviewed pumping schedule, lactogenesis II, breast care and benefits of breastmilk for NICU babies.  Plan of Care:  Encouraged mom to continue pumping every 2-3 hours, at least 8 times/24 hours She'll add coconut oil, breast massage and hand expression prior pumping sessions  BF brochure, BF resources and NICU booklet were reviewed. FOB present and supportive. Parents reported all questions and concerns were answered, they're both aware of [place] OP services and will call PRN.   Maternal Data Has patient been taught Hand Expression?: Yes Does the patient have breastfeeding experience prior to this delivery?: No  Feeding Mother's Current Feeding Choice: Breast Milk Nipple Type: Nfant Slow Flow (purple)  LATCH Score                    Lactation Tools Discussed/Used Tools: Pump;Flanges;Coconut oil Flange Size: 24 Breast pump type: Double-Electric Breast Pump Pump Education: Setup, frequency, and cleaning;Milk Storage Reason for Pumping: ETI in NICU Pumping frequency: q 2-3 hours Pumped volume:  (droplets)  Interventions Interventions: Breast feeding basics reviewed;Breast massage;Hand express;Coconut oil;Education;DEBP  Discharge Pump: Personal;DEBP ([employer] Pump & Style) WIC Program: No  Consult Status Consult Status: Follow-up Date:  12/02/20 Follow-up type: In-patient    [name] 12/01/2020, 11:44 AM

## 2020-12-01 NOTE — Lactation Note (Signed)
This note was copied from a baby's chart. Lactation Consultation Note  Patient Name: [name] TGGYI'[id number] Date: 12/01/2020 Reason for consult: Follow-up assessment;1st time breastfeeding;Early term 37-38.6wks Age:38 hours, ETI female infant. Per RN, infant BF for 20 minutes using 20 mm NS and supplemented at the breast with donor breast milk 10 mls with curve tip syringe, prior to [place] entering the room. Infant is [facility]. Per mom, she has pumped twice today, LC assisted mom with using the DEBP and mom pumped 3 mls of colostrum. Mom will continue to BF infant according to cues, 8 to 12+ times or more within 24 hours. STS and offer her pumped EBM first before supplementing infant with donor breast milk. LC discussed infant's input and output. Mom made aware of O/P services, breastfeeding support groups, community resources, and our phone # for post-discharge questions.   LC notice an abrasion on mom's right nipple and  mom was given comfort gels, mom understands to wear bra with comfort gels and not use  them with coconut oil.  Mom's feeding plan: 1- Mom will  continue to BF infant according to feeding cues, 8 to 12 + or more times within 24 hours, using  20 mm NS, and plans supplement infant at the breast  with curve tip syringe, this is her choice with EBM/ and then donor breast milk. 2- Mom understands following [facility] feeding policy to offer day 2 ( 10-20 mls total ) of EBM/ and or donor breast milk. 3- Mom knows to limit total feeding to 30 minutes or less and not make infant wait to breastfeed. 4- Mom knows to call RN or LC if she needs further assistance with latching infant at the breast.   Mom's plan Maternal Data    Feeding Mother's Current Feeding Choice: Breast Milk and Donor Milk  LATCH Score                    Lactation Tools Discussed/Used Flange Size: 24 Breast pump type: Double-Electric Breast Pump Pump Education: Setup, frequency, and  cleaning;Milk Storage Reason for Pumping: Infant is ETI and previously in NICU Pumping frequency: Mom knows to pump every 3 hours for 15 minutes on inital setting. Pumped volume: 4 mL  Interventions Interventions: Breast feeding basics reviewed;Skin to skin;Expressed milk;Hand express;DEBP;Education;Comfort gels  Discharge    Consult Status Consult Status: Follow-up Date: 12/02/20 Follow-up type: In-patient    Vicente Serene 12/01/2020, 11:34 PM

## 2020-12-01 NOTE — Anesthesia Postprocedure Evaluation (Signed)
Anesthesia Post Note  Patient: [name]  Procedure(s) Performed: [place]     Patient location during evaluation: PACU Anesthesia Type: Spinal Level of consciousness: awake and alert Pain management: pain level controlled Vital Signs Assessment: post-procedure vital signs reviewed and stable Respiratory status: spontaneous breathing and respiratory function stable Cardiovascular status: blood pressure returned to baseline and stable Postop Assessment: spinal receding and no apparent nausea or vomiting Anesthetic complications: no   No notable events documented.  Last Vitals:  Vitals:   12/01/20 0110 12/01/20 0507  BP: (!) 91/50 (!) 95/49  Pulse: (!) 58 69  Resp: 14 16  Temp: (!) 36.4 C 36.6 C  SpO2: 97% 97%    Last Pain:  Vitals:   12/01/20 0507  TempSrc: Oral  PainSc: 0-No pain                 Audry Pili

## 2020-12-02 LAB — SURGICAL PATHOLOGY

## 2020-12-02 MED ORDER — FERROUS SULFATE 325 (65 FE) MG PO TABS
325.0000 mg | ORAL_TABLET | Freq: Every day | ORAL | Status: DC
  Administered 2020-12-03: 325 mg via ORAL
  Filled 2020-12-02: qty 1

## 2020-12-02 NOTE — Progress Notes (Signed)
Subjective: POD# 2 Information for the patient's newborn:  Analy, Bassford [[id number]]  female   Baby in room with pt.  Reports feeling "Really good, just sore when I get up" Feeding: breast Reports tolerating PO and denies N/V, foley removed, ambulating and urinating w/o difficulty  Pain controlled with acetaminophen and ibuprofen (OTC) Denies HA/SOB/dizziness  Flatus passing Vaginal bleeding is normal, no clots     Objective:  VS:  Vitals:   12/01/20 0507 12/01/20 0910 12/01/20 2300 12/02/20 0546  BP: (!) 95/49 (!) 101/51 (!) 99/54 106/61  Pulse: 69 70 74 69  Resp: 16 17 18 18   Temp: 97.9 F (36.6 C) 98.1 F (36.7 C) 98 F (36.7 C) 98 F (36.7 C)  TempSrc: Oral Oral Oral Oral  SpO2: 97% 97% 100% 99%  Weight:      Height:        Intake/Output Summary (Last 24 hours) at 12/02/2020 1022 Last data filed at 12/02/2020 0500 Gross per 24 hour  Intake 0 ml  Output 200 ml  Net -200 ml     Recent Labs    11/29/20 1037 12/01/20 0501  WBC 12.9* 16.4*  HGB 11.6* 9.7*  HCT 37.8 30.1*  PLT 290 247    Blood type: --/--/O POS (07/05 1040) Rubella: Immune (12/21 0000)    Physical Exam:  General: alert, cooperative, and no distress CV: Regular rate and rhythm Resp: clear Abdomen: soft, nontender, normal bowel sounds Incision: clean, dry, and intact Perineum:  Uterine Fundus: firm, below umbilicus, nontender Lochia: minimal Ext: extremities normal, atraumatic, no cyanosis or edema   Assessment/Plan: 40 y.o.   POD# 2. [id number] Primary cesarean.                 Active Problems:   Obesity affecting pregnancy   Hernia, inguinal   IDA (iron deficiency anemia)   Female infertility   AMA (advanced maternal age) primigravida 35+   History of myomectomy   Routine post-op PP care          Advance diet as tolerated Advised warm fluids and ambulation to improve GI motility Encourage rest when baby rests Breastfeeding support Anticipate D/C 12/03/20 Dr Charlesetta Garibaldi aware  of pt status and [place], MSN, CNM 12/02/2020, 10:22 AM

## 2020-12-02 NOTE — Lactation Note (Signed)
This note was copied from a baby's chart. Lactation Consultation Note  Patient Name: [name] RSWNI'O Date: 12/02/2020   Age:38 hours, -7% weight loss. Per mom, she did not latch infant at recent feeding 30 minutes ago, she gave infant 3 mls of her EBM that she had pumped earlier and 30 mls of donor breast milk using the slow flow bottle ( purple and gold nipple). Infant is now latching for 20  minutes most feedings with the 20 mm NS. Per mom, she was having pain recently when applying NS, LC reviewed how to apply NS with mom, per mom, she doesn't feel pain, LC ask mom to teach back.  Mom was pumping with DEBP when LC left the room. Mom knows to call RN or LC if she has further BF questions, concerns or need assist with latching infant at the breast with 20 mm NS.  Mom's plans: 1- Mom will continue to BF infant  following [facility] feeding policy  with 20 mm NS and limit total feedings to 30 minutes.  2- Mom will continue to supplement infant with EBM first and then donor breast milk at Day 3 ( 30 mls per feeding) 3- Mom will continue to use DEBP every 3 hours for 15 minutes on initial setting.  Maternal Data    Feeding    LATCH Score                    Lactation Tools Discussed/Used    Interventions    Discharge    Consult Status      Vicente Serene 12/02/2020, 10:39 PM

## 2020-12-02 NOTE — Lactation Note (Signed)
This note was copied from a baby's chart. Lactation Consultation Note  Patient Name: Girl Anuhea Gassner WNIOE'[id number] Date: 12/02/2020 Reason for consult: Follow-up assessment Age:38 hours/ 6 % weight loss - baby gained over night  As LC entered mom finishing the donor  milk supplement from a bottle after breast feeding. LC did not see the latch.  Per mom feels more comfortable with using the NS and the right sore nipple is better.  When pumping EBM 11 ml on the right and drops on the left breast.  LC reassured mom the milk usually comes in quicker on mom breast compared to the other.  Per mom feels comfortable with the feeding plan.  LC recommended to continue the same feeding plan when she goes home .  LC recommended and offered to request and LC O/P next week and mom receptive.  Is aware she will receive a call on this coming Monday to set up appt,.  Mom has the [facility] brochure with resource numbers.  See below for review of D/C BF teaching.    Maternal Data    Feeding Mother's Current Feeding Choice: Breast Milk and Donor Milk Nipple Type: Extra Slow Flow  LATCH Score  Lactation Tools Discussed/Used Tools: Pump;Flanges;Coconut oil;Comfort gels;Nipple Shields Nipple shield size: 20 Flange Size: 24 Breast pump type: Double-Electric Breast Pump  Interventions Interventions: Breast feeding basics reviewed;Assisted with latch;Skin to skin;Breast massage;Breast compression;Adjust position;Support pillows;Position options;Education  Discharge - reviewed sore nipple and engorgement prevention and tx .  S/S of mastitis , storage of breastmilk,reasons to call the [facility] for S/S of illness in the baby.      Consult Status - Complete - 12/03/2020 LC offered to request and LC O/P appt for next week and mom receptive.      Jerlyn Ly Machell Wirthlin 12/02/2020, 12:52 PM

## 2020-12-03 DIAGNOSIS — D62 Acute posthemorrhagic anemia: Secondary | ICD-10-CM | POA: Diagnosis not present

## 2020-12-03 MED ORDER — ACETAMINOPHEN 325 MG PO TABS: 650.0000 mg | ORAL_TABLET | Freq: Four times a day (QID) | ORAL | 0 refills | Status: AC | PRN

## 2020-12-03 MED ORDER — OXYCODONE HCL 5 MG PO TABS: 5.0000 mg | ORAL_TABLET | ORAL | 0 refills | Status: DC | PRN

## 2020-12-03 NOTE — Discharge Summary (Signed)
PCS OB Discharge Summary     Patient Name: [name] DOB: 21-Aug-1980 MRN: [id number]  Date of admission: 11/30/2020 Delivering MD: Everett Graff  Date of delivery: 11/30/2020 Type of delivery: PCS  Newborn Data: Sex: 60 female Live born female  Birth Weight: 6 lb 11.9 oz (3060 g) APGAR: 76, 8  Newborn Delivery   Birth date/time: 11/30/2020 16:04:00 Delivery type: C-Section, Low Transverse Trial of labor: No C-section categorization: Primary      Feeding: breast Infant being discharge to home with mother in stable condition.   Admitting diagnosis: History of myomectomy [Z98.890] Intrauterine pregnancy: [redacted]w[redacted]d    Secondary diagnosis:  Active Problems:   Obesity affecting pregnancy   Hernia, inguinal   IDA (iron deficiency anemia)   Female infertility   AMA (advanced maternal age) primigravida 35+   History of myomectomy   Acute blood loss anemia   Cesarean delivery delivered   Normal postpartum course                                Complications: None                                                              Intrapartum Procedures: cesarean: low cervical, transverse Postpartum Procedures: none Complications-Operative and Postpartum: none Augmentation: AROM and at times of delivery   History of Present Illness: [name] a 40y.o. female, G1P1001, who presents at 374w0deeks gestation. [facility]  Her pregnancy has been complicated by:  Patient Active Problem List   Diagnosis Date Noted   Acute blood loss anemia 12/03/2020   Cesarean delivery delivered 12/03/2020   Normal postpartum course 12/03/2020   FIBROIDS, UTERUS 11/30/2020   Obesity affecting pregnancy 11/30/2020   Hernia, inguinal 11/30/2020   IDA (iron deficiency anemia) 11/30/2020   Female infertility 11/30/2020   AMA (advanced maternal age) primigravida 35+ 11/30/2020   History of myomectomy 11/30/2020    Prediabetes 08/13/2019   Spigelian hernia 12/24/2018   Incisional hernia, without obstruction or gangrene 12/24/2018   Vitamin D deficiency 10/02/2017   S/P myomectomy 11/10/2014   Fibroid, uterine 04/07/2014   High risk medication use 09/26/2012   [facility]'s disease ([facility]20[facility]   Class 2 severe obesity with serious comorbidity and body mass index (BMI) of 37.0 to 37.9 in adult ([facility]2010   CROHN'S DISEASE 01/13/2009   ERYTHEMA NODOSUM 09/03/2008   ECZEMA 07/07/2007     Active Ambulatory Problems    Diagnosis Date Noted   FIBROIDS, UTERUS 11/30/2020   CROHN'S DISEASE 01/13/2009   ECZEMA 07/07/2007   ERYTHEMA NODOSUM 09/03/2008   Class 2 severe obesity with serious comorbidity and body mass index (BMI) of 37.0 to 37.9 in adult ([facility]11/05/2010   High risk medication use 09/26/2012   Fibroid, uterine 04/07/2014   S/P myomectomy 11/10/2014   Crohn's disease ([facility]/20/2013   Vitamin D deficiency 10/02/2017   Spigelian hernia 12/24/2018   Incisional hernia, without obstruction or gangrene 12/24/2018   Prediabetes 08/13/2019   Resolved Ambulatory Problems    Diagnosis Date Noted   PERSISTENT DISORDER INIT/MAINTAINING WAKEFULNESS 04/18/2010   TONSILLAR HYPERTROPHY 09/03/2008  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[place]) 05/14/2007   CROHN'S DISEASE-LARGE & SMALL INTESTINE 05/26/2009   COLITIS 03/16/2005   CONSTIPATION 06/25/2008   PILONIDAL CYST, WITH ABSCESS 04/18/2010   ABDOMINAL PAIN-RLQ 04/29/2009   ABDOMINAL PAIN OTHER SPECIFIED SITE 01/11/2009   ABDOMINAL ULTRASOUND, ABNORMAL 05/26/2009   THYROID FUNCTION TEST, ABNORMAL 04/18/2010   UNSPECIFIED SLEEP DISTURBANCE 05/18/2010   Left wrist pain 11/08/2010   Cerumen impaction 11/08/2010   Back pain 03/06/2011   External otitis 03/07/2011   Voiding dysfunction 03/07/2011   Anal or rectal pain 03/29/2011   Hypoalbuminemia 03/29/2011   Incisional hernia, RLQ 10/02/2011   Acute laryngitis 09/26/2012   URI,  acute 09/26/2012   Anemia 03/31/2013   Anemia, unspecified 08/31/2013   Medication side effect 08/31/2013   Head pain 08/31/2013   CC (Crohn's colitis) ([place]) 05/24/2015   Other fatigue 10/02/2017   Shortness of breath on exertion 10/02/2017   Hyperglycemia 10/02/2017   Crohn's disease of colon without complication ([place]) 40/98/1191   Past Medical History:  Diagnosis Date   Abrasion of upper arm with infection, left, sequela    Chicken pox    Chronic rhinitis    Crohn's 11-15-11   Fibroids 11-15-11   Hernia    Infertility, female    Mononucleosis    Obesity    Pilonidal abscess    Rectal abscess      Hospital course:  Sceduled C/S   40 y.o. yo G1P1001 at 30w0dwas admitted to the hospital 11/30/2020 for scheduled cesarean section with the following indication: h/o myomectomy  .Delivery details are as follows:  Membrane Rupture Time/Date: 4:04 PM ,11/30/2020   Delivery Method:C-Section, Low Transverse  Details of operation can be found in separate operative note.  Patient had an uncomplicated postpartum course.  She is ambulating, tolerating a regular diet, passing flatus, and urinating well. Patient is discharged home in stable condition on  12/03/20        Newborn Data: Birth date:11/30/2020  Birth time:4:04 PM  Gender:Female  Living status:Living  Apgars:8 ,8  Weight:3060 g    POD# 3 :   Hospital Course--Scheduled Cesarean: Patient was admitted on 7/6 for a scheduled Primary cesarean delivery.   She was taken to the operating room, where Dr. RMancel Baleperformed a Primary LTCS under spinal anesthesia for h/o myomectomy and hernia repair with mesh, with delivery of a viable baby female, with weight and Apgars as listed below. Infant was in good condition and remained at the patient's bedside.  The patient was taken to recovery in good condition.  Patient planned to breast feed.  On post-op day 1, patient was doing well, tolerating a regular diet, with Hgb of 11.6-9.7 with surgical EBL of  8213m, asymptomatic on PO Iron.  Throughout her stay, her physical exam was WNL, her incision was CDI, and her vital signs remained stable.  By post-op day 1, she was up ad lib, tolerating a regular diet, with good pain control with po med.  She was deemed to have received the full benefit of her hospital stay, and was discharged home in stable condition.  Contraceptive choice was undecided.    Physical exam  Vitals:   12/02/20 0546 12/02/20 1433 12/02/20 2113 12/03/20 0538  BP: 106/61 118/64 133/66 (!) 117/57  Pulse: 69 67 100 69  Resp: 18 17 18 18   Temp: 98 F (36.7 C) 98.1 F (36.7 C)  97.9 F (36.6 C)  TempSrc: Oral Oral  Oral  SpO2: 99% 100% 100% 100%  Weight:      Height:       General: alert, cooperative, and no distress Lochia: appropriate Uterine Fundus: firm Incision: Healing well with no significant drainage, No significant erythema, Dressing is clean, dry, and intact, honeycomb dressing CDI Perineum: Intact DVT Evaluation: No evidence of DVT seen on physical exam. Negative Homan's sign. No cords or calf tenderness. No significant calf/ankle edema.  Labs: Lab Results  Component Value Date   WBC 16.4 (H) 12/01/2020   HGB 9.7 (L) 12/01/2020   HCT 30.1 (L) 12/01/2020   MCV 85.3 12/01/2020   PLT 247 12/01/2020   CMP Latest Ref Rng & Units 11/30/2020  Glucose 70 - 99 mg/dL 56(L)  BUN 6 - 20 mg/dL 6  Creatinine 0.44 - 1.00 mg/dL 0.58  Sodium 135 - 145 mmol/L 135  Potassium 3.5 - 5.1 mmol/L 5.1  Chloride 98 - 111 mmol/L 106  CO2 22 - 32 mmol/L 18(L)  Calcium 8.9 - 10.3 mg/dL 8.7(L)  Total Protein 6.0 - 8.5 g/dL -  Total Bilirubin 0.0 - 1.2 mg/dL -  Alkaline Phos 44 - 121 IU/L -  AST 0 - 40 IU/L -  ALT 0 - 32 IU/L -    Date of discharge: 12/03/2020 Discharge Diagnoses: Term Pregnancy-delivered Discharge instruction: per After Visit Summary and "Baby and Me Booklet".  After visit meds:   Activity:           unrestricted and pelvic rest Advance as tolerated.  Pelvic rest for 6 weeks.  Diet:                routine Medications: PNV, Ibuprofen, Colace, and Iron Postpartum contraception: Undecided Condition:  Pt discharge to home with baby in stable and condition Anemia: PO Iron   Meds: Allergies as of 12/03/2020   No Known Allergies      Medication List     TAKE these medications    acetaminophen 325 MG tablet Commonly known as: TYLENOL Take 2 tablets (650 mg total) by mouth every 6 (six) hours as needed for up to 7 days for moderate pain, mild pain or fever.   Calcium/Vitamin D 600-400 MG-UNIT Tabs Take 1 tablet by mouth daily.   ferrous sulfate 325 (65 FE) MG tablet Take 325 mg by mouth every other day.   fexofenadine 180 MG tablet Commonly known as: ALLEGRA Take 180 mg by mouth daily.   fluticasone 50 MCG/ACT nasal spray Commonly known as: FLONASE Place 1 spray into both nostrils daily.   oxyCODONE 5 MG immediate release tablet Commonly known as: Oxy IR/ROXICODONE Take 1-2 tablets (5-10 mg total) by mouth every 4 (four) hours as needed for moderate pain.   prenatal multivitamin Tabs tablet Take 1 tablet by mouth daily at 12 noon.   REMICADE IV Inject into the vein every 8 (eight) weeks.   vitamin D3 50 MCG (2000 UT) Caps Take 2,000 Units by mouth daily.               Discharge Care Instructions  (From admission, onward)           Start     Ordered   12/03/20 0000  Discharge wound care:       Comments: Take dressing off on day 5-7 postpartum.  Report increased drainage, redness or warmth. Clean with water, let soap trickle down body. Can leave steri strips on until they fall off or take them off gently at day 10. Keep open to air, clean and dry.   12/03/20  4159            Discharge Follow Up:   Follow-up [facility]. Schedule an appointment as soon as possible for a visit in 6 week(s).   Specialty: Obstetrics and Gynecology Contact information: [address] [place] [address] [phone]                 [place], NP-C, CNM 12/03/2020, 8:15 AM  Noralyn Pick, [place]

## 2020-12-08 ENCOUNTER — Telehealth (HOSPITAL_COMMUNITY): Payer: Self-pay | Admitting: Lactation Services

## 2020-12-08 NOTE — Telephone Encounter (Signed)
This mom called with breast feeding questions / issues with latching due to sore nipples, baby falling asleep at the breast / and some engorgement. Also the NS isn't fitting the way it was.  Baby was Early term , per mom Birth weight 6-11oz , 66-6oz at D/C and 1st doctor visit 6-3 oz. And will be returning tomorrow for weight check.  Milk came in Sunday and was pumping feeding and pumping every 3 hours. Baby starting being sleepy at the breast / so started supplementing with breast milk and formula ( which the formula made her gasy ).  Back to pumping on Tuesday and to the breast some and ended up being to painful due to sore nipples.  So mom has been working on the pumping and just bottle feeding the baby.  Volumes pumped off 30 -22 -33m.  LC reviewed supply and demand and the recommended pumpings in 24 hours to establish and protect the milk supply . ( 8-10 thimes 15 - 20 mins both breast together.  Save milk for the next feeding.   LC also recommended to consider making appt with LC O/P for F/U.  LC reminded mom baby was latching well in the hospital and for now work on clearing up sore nipples / protect the milk supply.  LC O/P numbers provided for mom and recommended she call back as needed.

## 2020-12-13 ENCOUNTER — Telehealth (HOSPITAL_COMMUNITY): Payer: Self-pay | Admitting: *Deleted

## 2020-12-13 NOTE — Telephone Encounter (Signed)
Left message to return nurse call.  Odis Hollingshead, RN 12-13-2020 at 2:48pm

## 2020-12-26 ENCOUNTER — Ambulatory Visit (INDEPENDENT_AMBULATORY_CARE_PROVIDER_SITE_OTHER): Payer: BC Managed Care – PPO | Admitting: Physician Assistant

## 2021-01-11 ENCOUNTER — Ambulatory Visit (INDEPENDENT_AMBULATORY_CARE_PROVIDER_SITE_OTHER): Payer: BC Managed Care – PPO | Admitting: Physician Assistant

## 2021-02-13 ENCOUNTER — Ambulatory Visit (INDEPENDENT_AMBULATORY_CARE_PROVIDER_SITE_OTHER): Payer: BC Managed Care – PPO | Admitting: Family Medicine

## 2021-02-13 ENCOUNTER — Encounter (INDEPENDENT_AMBULATORY_CARE_PROVIDER_SITE_OTHER): Payer: Self-pay | Admitting: Family Medicine

## 2021-02-13 ENCOUNTER — Other Ambulatory Visit: Payer: Self-pay

## 2021-02-13 VITALS — BP 116/71 | HR 88 | Temp 97.9°F | Ht 63.0 in | Wt 221.0 lb

## 2021-02-13 DIAGNOSIS — Z9189 Other specified personal risk factors, not elsewhere classified: Secondary | ICD-10-CM | POA: Diagnosis not present

## 2021-02-13 DIAGNOSIS — K50919 Crohn's disease, unspecified, with unspecified complications: Secondary | ICD-10-CM

## 2021-02-13 DIAGNOSIS — Z6841 Body Mass Index (BMI) 40.0 and over, adult: Secondary | ICD-10-CM | POA: Diagnosis not present

## 2021-02-13 DIAGNOSIS — R7303 Prediabetes: Secondary | ICD-10-CM

## 2021-02-13 NOTE — Progress Notes (Signed)
Chief Complaint:   OBESITY [name] is here to discuss her progress with her obesity treatment plan along with follow-up of her obesity related diagnoses.   Today's visit was #: 20 Starting weight: 293 lbs Starting date: 10/02/2017 Today's weight: 221 lbs Today's date: 02/13/2021 Weight change since last visit: 1 lb Total lbs lost to date: 72 lbs Body mass index is 39.15 kg/m.  Total weight loss percentage to date: -24.57%  Current Meal Plan: keeping a food journal and adhering to recommended goals of 1500 calories and 100 grams of protein for 0% of the time.  Current Exercise Plan: None.  Interim History:  [name] had a cesarean section on 7/6.  Also has Crohn's disease.  Endorses increased fluid retention.  RMR 2026.  She is formula feeding.  Reports getting 4-6 hours of sleep.  She is not journaling at this time.  For breakfast, she is having protein oatmeal and for dinner, lean meats.  She says she got Lean Cuisine for lunch - thinking ahead.  She is getting in 60-80 ounces of protein.  Plan:  Will check fasting labs at next visit.  Considering metformin.  Assessment/Plan:   1. Pre-diabetes At goal. Goal is HgbA1c < 5.7.  Medication: None.    Plan:  She will continue to focus on protein-rich, low simple carbohydrate foods. We reviewed the importance of hydration, regular exercise for stress reduction, and restorative sleep.   Lab Results  Component Value Date   HGBA1C 5.1 10/31/2020   Lab Results  Component Value Date   INSULIN 4.2 10/31/2020   INSULIN 3.0 07/06/2020   INSULIN 5.2 01/13/2020   INSULIN 4.5 07/14/2019   INSULIN 4.2 03/24/2019   2. Crohn's disease with complication, unspecified gastrointestinal tract location [facility]) [name] is followed by Rheumatology and gets Remicade infusions for Crohn's disease.  3. At risk for activity intolerance [name] was given approximately 8 minutes of counseling today regarding her increased risk for exercise intolerance.  We  discussed patient's specific personal and medical issues that raise our concern.    4. Obesity, current BMI 39.1  Course: [name] is currently in the action stage of change. As such, her goal is to continue with weight loss efforts.   Nutrition goals: She has agreed to keeping a food journal and adhering to recommended goals of 1500 calories and 100 grams of protein.   Exercise goals:  Increase movement.  Behavioral modification strategies: increasing lean protein intake, decreasing simple carbohydrates, increasing vegetables, and increasing water intake.  [name] has agreed to follow-up with our clinic in 4 weeks. She was informed of the importance of frequent follow-up visits to maximize her success with intensive lifestyle modifications for her multiple health conditions.   Objective:   Blood pressure 116/71, pulse 88, temperature 97.9 F (36.6 C), temperature source Oral, height 5' 3"  (1.6 m), weight 221 lb (100.2 kg), last menstrual period 03/01/2020, SpO2 96 %, unknown if currently breastfeeding. Body mass index is 39.15 kg/m.  General: Cooperative, alert, well developed, in no acute distress. HEENT: Conjunctivae and lids unremarkable. Cardiovascular: Regular rhythm.  Lungs: Normal work of breathing. Neurologic: No focal deficits.   Lab Results  Component Value Date   CREATININE 0.58 11/30/2020   BUN 6 11/30/2020   NA 135 11/30/2020   K 5.1 11/30/2020   CL 106 11/30/2020   CO2 18 (L) 11/30/2020   Lab Results  Component Value Date   ALT 11 10/31/2020   AST 21 10/31/2020   ALKPHOS 80  10/31/2020   BILITOT <0.2 10/31/2020   Lab Results  Component Value Date   HGBA1C 5.1 10/31/2020   HGBA1C 5.5 07/06/2020   HGBA1C 5.3 01/13/2020   HGBA1C 5.4 07/14/2019   HGBA1C 5.2 03/24/2019   Lab Results  Component Value Date   INSULIN 4.2 10/31/2020   INSULIN 3.0 07/06/2020   INSULIN 5.2 01/13/2020   INSULIN 4.5 07/14/2019   INSULIN 4.2 03/24/2019   Lab Results  Component  Value Date   TSH 0.89 08/26/2019   Lab Results  Component Value Date   CHOL 195 10/31/2020   HDL 79 10/31/2020   LDLCALC 98 10/31/2020   TRIG 105 10/31/2020   CHOLHDL 2.5 10/31/2020   Lab Results  Component Value Date   VD25OH 53.9 10/31/2020   VD25OH 55.7 07/06/2020   VD25OH 84.1 01/13/2020   Lab Results  Component Value Date   WBC 16.4 (H) 12/01/2020   HGB 9.7 (L) 12/01/2020   HCT 30.1 (L) 12/01/2020   MCV 85.3 12/01/2020   PLT 247 12/01/2020   Lab Results  Component Value Date   IRON 114 07/29/2013   FERRITIN 14.8 07/29/2013   Attestation Statements:   Reviewed by clinician on day of visit: allergies, medications, problem list, medical history, surgical history, family history, social history, and previous encounter notes.  I, Water quality scientist, CMA, am acting as transcriptionist for Briscoe Deutscher, DO  I have reviewed the above documentation for accuracy and completeness, and I agree with the above. Briscoe Deutscher, DO

## 2021-03-15 ENCOUNTER — Encounter (INDEPENDENT_AMBULATORY_CARE_PROVIDER_SITE_OTHER): Payer: Self-pay | Admitting: Physician Assistant

## 2021-03-15 ENCOUNTER — Ambulatory Visit (INDEPENDENT_AMBULATORY_CARE_PROVIDER_SITE_OTHER): Payer: BC Managed Care – PPO | Admitting: Physician Assistant

## 2021-03-15 ENCOUNTER — Other Ambulatory Visit: Payer: Self-pay

## 2021-03-15 VITALS — BP 91/63 | HR 84 | Temp 98.1°F | Ht 63.0 in | Wt 221.0 lb

## 2021-03-15 DIAGNOSIS — E7849 Other hyperlipidemia: Secondary | ICD-10-CM | POA: Diagnosis not present

## 2021-03-15 DIAGNOSIS — R7303 Prediabetes: Secondary | ICD-10-CM

## 2021-03-15 DIAGNOSIS — E559 Vitamin D deficiency, unspecified: Secondary | ICD-10-CM | POA: Diagnosis not present

## 2021-03-15 DIAGNOSIS — Z6841 Body Mass Index (BMI) 40.0 and over, adult: Secondary | ICD-10-CM

## 2021-03-15 DIAGNOSIS — Z9189 Other specified personal risk factors, not elsewhere classified: Secondary | ICD-10-CM | POA: Diagnosis not present

## 2021-03-15 NOTE — Progress Notes (Signed)
Chief Complaint:   OBESITY [name] is here to discuss her progress with her obesity treatment plan along with follow-up of her obesity related diagnoses. [name] is on keeping a food journal and adhering to recommended goals of 1500 calories and 100 grams protein and states she is following her eating plan approximately 60% of the time. [name] states she is not currently exercising.  Today's visit was #: 72 Starting weight: 293 lbs Starting date: 10/02/2017 Today's weight: 221 lbs Today's date: 03/15/2021 Total lbs lost to date: 72 Total lbs lost since last in-office visit: 0  Interim History: [name] says that she is eating "good things and bad things". She is skipping breakfast some days. She is not getting all of her protein in at dinner because her husband is cooking. She is having sugar cravings. She is averaging 88 grams protein and 2172 calories.  Subjective:   1. Pre-diabetes [name] is not currently on medication. She reports cravings.  2. Other hyperlipidemia Pt is not on medication and is not exercising.  3. Vitamin D deficiency She is on OTC Vit D daily. Pt due for labs.  4. At risk for diabetes mellitus [name] is at higher than average risk for developing diabetes due to obesity.   Assessment/Plan:   1. Pre-diabetes [name] will continue to work on weight loss, exercise, and decreasing simple carbohydrates to help decrease the risk of diabetes. We discussed possibly restarting Metformin after lab results. Check labs today.  - Comprehensive metabolic panel - Hemoglobin A1c - Insulin, random  2. Other hyperlipidemia Cardiovascular risk and specific lipid/LDL goals reviewed.  We discussed several lifestyle modifications today and [name] will continue to work on diet, exercise and weight loss efforts. Orders and follow up as documented in patient record.   Counseling Intensive lifestyle modifications are the first line treatment for this issue. Dietary changes:  Increase soluble fiber. Decrease simple carbohydrates. Exercise changes: Moderate to vigorous-intensity aerobic activity 150 minutes per week if tolerated. Lipid-lowering medications: see documented in medical record. Check labs today.  - Lipid panel  3. Vitamin D deficiency Low Vitamin D level contributes to fatigue and are associated with obesity, breast, and colon cancer. She agrees to continue to take OTC Vitamin D daily and will follow-up for routine testing of Vitamin D, at least 2-3 times per year to avoid over-replacement. Check labs today.  - VITAMIN D 25 Hydroxy (Vit-D Deficiency, Fractures)  4. At risk for diabetes mellitus [name] was given approximately 15 minutes of diabetes education and counseling today. We discussed intensive lifestyle modifications today with an emphasis on weight loss as well as increasing exercise and decreasing simple carbohydrates in her diet. We also reviewed medication options with an emphasis on risk versus benefit of those discussed.   Repetitive spaced learning was employed today to elicit superior memory formation and behavioral change.  5. Obesity, current BMI 39.1  [name] is currently in the action stage of change. As such, her goal is to continue with weight loss efforts. She has agreed to keeping a food journal and adhering to recommended goals of 1500 calories and 100 grams protein.   Exercise goals:  As is  Behavioral modification strategies: planning for success.  [name] has agreed to follow-up with our clinic in 4 weeks. She was informed of the importance of frequent follow-up visits to maximize her success with intensive lifestyle modifications for her multiple health conditions.   [name] was informed we would discuss her lab results at her next visit  unless there is a critical issue that needs to be addressed sooner. [name] agreed to keep her next visit at the agreed upon time to discuss these results.  Objective:   Blood pressure  91/63, pulse 84, temperature 98.1 F (36.7 C), height 5' 3"  (1.6 m), weight 221 lb (100.2 kg), SpO2 97 %, unknown if currently breastfeeding. Body mass index is 39.15 kg/m.  General: Cooperative, alert, well developed, in no acute distress. HEENT: Conjunctivae and lids unremarkable. Cardiovascular: Regular rhythm.  Lungs: Normal work of breathing. Neurologic: No focal deficits.   Lab Results  Component Value Date   CREATININE 0.58 11/30/2020   BUN 6 11/30/2020   NA 135 11/30/2020   K 5.1 11/30/2020   CL 106 11/30/2020   CO2 18 (L) 11/30/2020   Lab Results  Component Value Date   ALT 11 10/31/2020   AST 21 10/31/2020   ALKPHOS 80 10/31/2020   BILITOT <0.2 10/31/2020   Lab Results  Component Value Date   HGBA1C 5.1 10/31/2020   HGBA1C 5.5 07/06/2020   HGBA1C 5.3 01/13/2020   HGBA1C 5.4 07/14/2019   HGBA1C 5.2 03/24/2019   Lab Results  Component Value Date   INSULIN 4.2 10/31/2020   INSULIN 3.0 07/06/2020   INSULIN 5.2 01/13/2020   INSULIN 4.5 07/14/2019   INSULIN 4.2 03/24/2019   Lab Results  Component Value Date   TSH 0.89 08/26/2019   Lab Results  Component Value Date   CHOL 195 10/31/2020   HDL 79 10/31/2020   LDLCALC 98 10/31/2020   TRIG 105 10/31/2020   CHOLHDL 2.5 10/31/2020   Lab Results  Component Value Date   VD25OH 53.9 10/31/2020   VD25OH 55.7 07/06/2020   VD25OH 84.1 01/13/2020   Lab Results  Component Value Date   WBC 16.4 (H) 12/01/2020   HGB 9.7 (L) 12/01/2020   HCT 30.1 (L) 12/01/2020   MCV 85.3 12/01/2020   PLT 247 12/01/2020   Lab Results  Component Value Date   IRON 114 07/29/2013   FERRITIN 14.8 07/29/2013    Attestation Statements:   Reviewed by clinician on day of visit: allergies, medications, problem list, medical history, surgical history, family history, social history, and previous encounter notes.  Coral Ceo, CMA, am acting as transcriptionist for [employer], PA-C.  I have reviewed the above  documentation for accuracy and completeness, and I agree with the above. Abby Potash, PA-C

## 2021-03-16 ENCOUNTER — Ambulatory Visit (INDEPENDENT_AMBULATORY_CARE_PROVIDER_SITE_OTHER): Payer: BC Managed Care – PPO | Admitting: Family Medicine

## 2021-03-16 LAB — COMPREHENSIVE METABOLIC PANEL
ALT: 11 IU/L (ref 0–32)
AST: 19 IU/L (ref 0–40)
Albumin/Globulin Ratio: 1.1 — ABNORMAL LOW (ref 1.2–2.2)
Albumin: 4 g/dL (ref 3.8–4.8)
Alkaline Phosphatase: 97 IU/L (ref 44–121)
BUN/Creatinine Ratio: 15 (ref 9–23)
BUN: 12 mg/dL (ref 6–20)
Bilirubin Total: 0.3 mg/dL (ref 0.0–1.2)
CO2: 23 mmol/L (ref 20–29)
Calcium: 9.1 mg/dL (ref 8.7–10.2)
Chloride: 100 mmol/L (ref 96–106)
Creatinine, Ser: 0.79 mg/dL (ref 0.57–1.00)
Globulin, Total: 3.7 g/dL (ref 1.5–4.5)
Glucose: 76 mg/dL (ref 70–99)
Potassium: 4.1 mmol/L (ref 3.5–5.2)
Sodium: 138 mmol/L (ref 134–144)
Total Protein: 7.7 g/dL (ref 6.0–8.5)
eGFR: 99 mL/min/{1.73_m2} (ref >59)

## 2021-03-16 LAB — HEMOGLOBIN A1C
Est. average glucose Bld gHb Est-mCnc: 103 mg/dL
Hgb A1c MFr Bld: 5.2 % (ref 4.8–5.6)

## 2021-03-16 LAB — LIPID PANEL
Chol/HDL Ratio: 2.6 ratio (ref 0.0–4.4)
Cholesterol, Total: 166 mg/dL (ref 100–199)
HDL: 63 mg/dL (ref >39)
LDL Chol Calc (NIH): 92 mg/dL (ref 0–99)
Triglycerides: 57 mg/dL (ref 0–149)
VLDL Cholesterol Cal: 11 mg/dL (ref 5–40)

## 2021-03-16 LAB — INSULIN, RANDOM: INSULIN: 4.4 u[IU]/mL (ref 2.6–24.9)

## 2021-03-16 LAB — VITAMIN D 25 HYDROXY (VIT D DEFICIENCY, FRACTURES): Vit D, 25-Hydroxy: 46.2 ng/mL (ref 30.0–100.0)

## 2021-04-03 ENCOUNTER — Other Ambulatory Visit: Payer: Self-pay

## 2021-04-03 ENCOUNTER — Ambulatory Visit: Payer: BC Managed Care – PPO | Admitting: Internal Medicine

## 2021-04-03 ENCOUNTER — Other Ambulatory Visit: Payer: Self-pay | Admitting: Internal Medicine

## 2021-04-03 ENCOUNTER — Encounter: Payer: Self-pay | Admitting: Internal Medicine

## 2021-04-03 VITALS — BP 108/60 | HR 83 | Temp 98.6°F | Ht 63.0 in | Wt 224.2 lb

## 2021-04-03 DIAGNOSIS — K50919 Crohn's disease, unspecified, with unspecified complications: Secondary | ICD-10-CM | POA: Diagnosis not present

## 2021-04-03 DIAGNOSIS — L089 Local infection of the skin and subcutaneous tissue, unspecified: Secondary | ICD-10-CM | POA: Diagnosis not present

## 2021-04-03 DIAGNOSIS — L304 Erythema intertrigo: Secondary | ICD-10-CM | POA: Diagnosis not present

## 2021-04-03 DIAGNOSIS — B9689 Other specified bacterial agents as the cause of diseases classified elsewhere: Secondary | ICD-10-CM

## 2021-04-03 DIAGNOSIS — Z6841 Body Mass Index (BMI) 40.0 and over, adult: Secondary | ICD-10-CM

## 2021-04-03 MED ORDER — CEPHALEXIN 500 MG PO CAPS: 500.0000 mg | ORAL_CAPSULE | Freq: Three times a day (TID) | ORAL | 0 refills | Status: DC

## 2021-04-03 MED ORDER — FLUCONAZOLE 150 MG PO TABS: ORAL_TABLET | ORAL | 1 refills | Status: DC

## 2021-04-03 MED ORDER — NYSTATIN 100000 UNIT/GM EX POWD
1.0000 | Freq: Two times a day (BID) | CUTANEOUS | 2 refills | Status: DC
Start: 2021-04-03 — End: 2021-06-12

## 2021-04-03 NOTE — Progress Notes (Signed)
Chief Complaint  Patient presents with   Pelvic Pain    Patient complains of right side pelvis pain, x2 weeks, Tried Cephalexin 527m and Sulfamethazine with little relief     HPI: Donna [email or web address].[id number]. come in forproblem based visit   Last visit was  3 2021  Has been in weight management and had sucessful pregnancy  c section July 2022    Had  local reaction to honeycomb dressing .  And then dx fungal infection  from [place]  and  rx for desitin and then dx with rx for cellulitis wound infection  per dr  still has redness  in area.   Coconut oil on  hcafing rash.  And last week   she feels that incision opened up again mostly itching and weeping.  Area in the suprapubic area anterior thighs some under the breast and axilla Has used OTC Desitin and Lotrimin product. It is hard to keep the area dry.  Has some vaginal itching also. She is not lactating or breast-feeding No fever . No chills   no associated symptoms Crohn's is quiescent c IBD Chrons  ROS: See pertinent positives and negatives per HPI.  Past Medical History:  Diagnosis Date   Abrasion of upper arm with infection, left, sequela    started antibiotics lef arm oct 4th started antibiotics healing well   Anemia    hx of 2 yrs ago   Chicken pox    Chronic rhinitis    Contact dermatitis and other eczema, due to unspecified cause    Crohn's 11-15-11   no issues in 2 yrs   Fibroids 11-15-11   remains with fibroids   Hernia    Incisional hernia, RLQ 10/02/2011   Infertility, female    Mononucleosis    Obesity    Pilonidal abscess    Rectal abscess    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HCC)    Vitamin D deficiency     Family History  Problem Relation Age of Onset   Aneurysm Mother        brain   Heart disease Mother 574      heart anyrism   Healthy Father        fairly   Diabetes Sister        half sister   Bipolar disorder Sister        half sister   Colon cancer Maternal Grandmother    Stomach cancer  Paternal Grandfather    Multiple sclerosis Sister     Social History   Socioeconomic History   Marital status: Married    Spouse name: CSulamita Lafountain  Number of children: Not on file   Years of education: Not on file   Highest education level: Bachelor's degree (e.g., BA, AB, BS)  Occupational History   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 Tobacco Use   Smoking status: Never   Smokeless tobacco: Never  Vaping Use   Vaping Use: Never used  Substance and Sexual Activity   Alcohol use: No   Drug use: No   Sexual activity: Yes    Partners: Male  Other Topics Concern   Not on file  Social History Narrative   married   HH of 2   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    No pets   Sleep 6-7 hours    BF with 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   Walking exercise   Eats balanced generally   Social  Determinants of Health   Financial Resource Strain: Low Risk    Difficulty of Paying Living Expenses: Not hard at all  Food Insecurity: No Food Insecurity   Worried About [place] in the Last Year: Never true   Ran Out of Food in the Last Year: Never true  Transportation Needs: No Transportation Needs   Lack of Transportation (Medical): No   Lack of Transportation (Non-Medical): No  Physical Activity: Unknown   Days of Exercise per Week: 0 days   Minutes of Exercise per Session: Not on file  Stress: No Stress Concern Present   Feeling of Stress : Not at all  Social Connections: Socially Integrated   Frequency of Communication with Friends and Family: More than three times a week   Frequency of Social Gatherings with Friends and Family: Patient refused   Attends Religious Services: More than 4 times per year   Active Member of [employer] or Organizations: Yes   Attends Archivist Meetings: 1 to 4 times per year   Marital Status: Married    Outpatient Medications Prior to Visit  Medication Sig Dispense Refill   Calcium Carb-Cholecalciferol (CALCIUM/VITAMIN D)  600-400 MG-UNIT TABS Take 1 tablet by mouth daily.     CALCIUM PO Take by mouth daily.     fexofenadine (ALLEGRA) 180 MG tablet Take 180 mg by mouth daily.     fluticasone (FLONASE) 50 MCG/ACT nasal spray Place 1 spray into both nostrils daily.     InFLIXimab (REMICADE IV) Inject into the vein every 8 (eight) weeks.      Prenatal Vit-Fe Fumarate-FA (PRENATAL MULTIVITAMIN) TABS tablet Take 1 tablet by mouth daily at 12 noon.     Vitamin D, Cholecalciferol, 50 MCG (2000 UT) CAPS Take 2,000 Units by mouth daily.     No facility-administered medications prior to visit.     EXAM:  BP 108/60 (BP Location: Right Arm, Patient Position: Sitting, Cuff Size: Large)   Pulse 83   Temp 98.6 F (37 C) (Oral)   Ht 5' 3"  (1.6 m)   Wt 224 lb 3.2 oz (101.7 kg)   SpO2 97%   BMI 39.72 kg/m   Body mass index is 39.72 kg/m. Here with 87-monthold cute alert baby.  Appears well as well as she GENERAL: vitals reviewed and listed above, alert, oriented, appears well hydrated and in no acute distress HEENT: atraumatic, conjunctiva  clear, no obvious abnormalities on inspection of external nose and ears OP : Masked NECK: no obvious masses on inspection palpation  LUNGS: c normal respirations CV: HRRR, no clubbing cyanosis or  peripheral edema nl cap refill skin shows very hyperemic somewhat thickened large area of intertrigo that goes into the suprapubic area.  Linear area shows some cracking with clear drainage a bit weepy upper thighs show satellite lesions.  There are no vesicles or pustules.  And not tender.  Mild redness rash under both breasts and right axilla but no lesions boils pustules. MS: moves all extremities without noticeable focal  abnormality PSYCH: pleasant and cooperative, no obvious depression or anxiety Lab Results  Component Value Date   WBC 16.4 (H) 12/01/2020   HGB 9.7 (L) 12/01/2020   HCT 30.1 (L) 12/01/2020   PLT 247 12/01/2020   GLUCOSE 76 03/15/2021   CHOL 166 03/15/2021    TRIG 57 03/15/2021   HDL 63 03/15/2021   LDLCALC 92 03/15/2021   ALT 11 03/15/2021   AST 19 03/15/2021   NA 138 03/15/2021  K 4.1 03/15/2021   CL 100 03/15/2021   CREATININE 0.79 03/15/2021   BUN 12 03/15/2021   CO2 23 03/15/2021   TSH 0.89 08/26/2019   INR 1.47 05/26/2009   HGBA1C 5.2 03/15/2021   BP Readings from Last 3 Encounters:  04/03/21 108/60  03/15/21 91/63  02/13/21 116/71    ASSESSMENT AND PLAN:  Discussed the following assessment and plan:  Intertrigo - Plan: WOUND CULTURE  Localized bacterial skin infection - Plan: WOUND CULTURE  Crohn's disease with complication, unspecified gastrointestinal tract location (HCC)  Obesity, current BMI 39.1 - 4 mos post partum  Overall this appears to be somewhat severe intertrigo with secondary yeast possibly bacterial previously treated with Keflex and Septra with some improvement. Would add oral antifungals local care keep dry antifungal powder and add 5 days of Keflex antibiotic Repeat culture of serous discharge weeping to be sent.  Rule out strep staph. Advise 9-day coverage for Diflucan plus topical let us know either how she is doing in 2 weeks or follow-up visit if not improving. Without systemic symptoms do not think it is an intra-abdominal or otherwise systemic process. Medical record review evaluation plan culture obtained treatment.  32 minutes -Patient advised to return or notify health care team  if  new concerns arise.  Patient Instructions  This is a sever case of intertrigo  involving yeast and probably bacteria  on skin  Treatment is trying to keep area  dry as possible.   Begin diflucan 1 every 3 days  as directed   Antibiotic for skin germs   We can add  powder nystatin or  you can use otc zysorb  AF    Advise fu  after 2 weeks  exam  if not getting better  Otherwise continue  topical  drying  antifungal powders etc.   Intertrigo Intertrigo is skin irritation or inflammation (dermatitis) that  occurs when folds of skin rub together. The irritation can cause a rash and make skin raw and itchy. This condition most commonly occurs in the skin folds of these areas: Toes. Armpits. Groin. Under the belly. Under the breasts. Buttocks. Intertrigo is not passed from person to person (is not contagious). What are the causes? This condition is caused by heat, moisture, rubbing (friction), and not enough air circulation. The condition can be made worse by: Sweat. Bacteria. A fungus, such as yeast. What increases the risk? This condition is more likely to occur if you have moisture in your skin folds. You are more likely to develop this condition if you: Have diabetes. Are overweight. Are not able to move around or are not active. Live in a warm and moist climate. Wear splints, braces, or other medical devices. Are not able to control your bowels or bladder (have incontinence). What are the signs or symptoms? Symptoms of this condition include: A pink or red skin rash in the skin fold or near the skin fold. Raw or scaly skin. Itchiness. A burning feeling. Bleeding. Leaking fluid. A bad smell. How is this diagnosed? This condition is diagnosed with a medical history and physical exam. You may also have a skin swab to test for bacteria or a fungus. How is this treated? This condition may be treated by: Cleaning and drying your skin. Taking an antibiotic medicine or using an antibiotic skin cream for a bacterial infection. Using an antifungal cream on your skin or taking pills for an infection that was caused by a fungus, such as yeast. Using a steroid  ointment to relieve itchiness and irritation. Separating the skin fold with a clean cotton cloth to absorb moisture and allow air to flow into the area. Follow these instructions at home: Keep the affected area clean and dry. Do not scratch your skin. Stay in a cool environment as much as possible. Use an air conditioner or fan, if  available. Apply over-the-counter and prescription medicines only as told by your health care provider. If you were prescribed an antibiotic medicine, use it as told by your health care provider. Do not stop using the antibiotic even if your condition improves. Keep all follow-up visits as told by your health care provider. This is important. How is this prevented?  Maintain a healthy weight. Take care of your feet, especially if you have diabetes. Foot care includes: Wearing shoes that fit well. Keeping your feet dry. Wearing clean, breathable socks. Protect the skin around your groin and buttocks, especially if you have incontinence. Skin protection includes: Following a regular cleaning routine. Using skin protectant creams, powders, or ointments. Changing protection pads frequently. Do not wear tight clothes. Wear clothes that are loose, absorbent, and made of cotton. Wear a bra that gives good support, if needed. Shower and dry yourself well after activity or exercise. Use a hair dryer on a cool setting to dry between skin folds, especially after you bathe. If you have diabetes, keep your blood sugar under control. Contact a health care provider if: Your symptoms do not improve with treatment. Your symptoms get worse or they spread. You notice increased redness and warmth. You have a fever. Summary Intertrigo is skin irritation or inflammation (dermatitis) that occurs when folds of skin rub together. This condition is caused by heat, moisture, rubbing (friction), and not enough air circulation. This condition may be treated by cleaning and drying your skin and with medicines. Apply over-the-counter and prescription medicines only as told by your health care provider. Keep all follow-up visits as told by your health care provider. This is important. This information is not intended to replace advice given to you by your health care provider. Make sure you discuss any questions you  have with your health care provider. Document Revised: 10/07/2017 Document Reviewed: 10/14/2017 Elsevier Patient Education  2022 [place]. Bayan Kushnir M.D.

## 2021-04-03 NOTE — Patient Instructions (Addendum)
This is a sever case of intertrigo  involving yeast and probably bacteria  on skin  Treatment is trying to keep area  dry as possible.   Begin diflucan 1 every 3 days  as directed   Antibiotic for skin germs   We can add  powder nystatin or  you can use otc zysorb  AF    Advise fu  after 2 weeks  exam  if not getting better  Otherwise continue  topical  drying  antifungal powders etc.   Intertrigo Intertrigo is skin irritation or inflammation (dermatitis) that occurs when folds of skin rub together. The irritation can cause a rash and make skin raw and itchy. This condition most commonly occurs in the skin folds of these areas: Toes. Armpits. Groin. Under the belly. Under the breasts. Buttocks. Intertrigo is not passed from person to person (is not contagious). What are the causes? This condition is caused by heat, moisture, rubbing (friction), and not enough air circulation. The condition can be made worse by: Sweat. Bacteria. A fungus, such as yeast. What increases the risk? This condition is more likely to occur if you have moisture in your skin folds. You are more likely to develop this condition if you: Have diabetes. Are overweight. Are not able to move around or are not active. Live in a warm and moist climate. Wear splints, braces, or other medical devices. Are not able to control your bowels or bladder (have incontinence). What are the signs or symptoms? Symptoms of this condition include: A pink or red skin rash in the skin fold or near the skin fold. Raw or scaly skin. Itchiness. A burning feeling. Bleeding. Leaking fluid. A bad smell. How is this diagnosed? This condition is diagnosed with a medical history and physical exam. You may also have a skin swab to test for bacteria or a fungus. How is this treated? This condition may be treated by: Cleaning and drying your skin. Taking an antibiotic medicine or using an antibiotic skin cream for a bacterial  infection. Using an antifungal cream on your skin or taking pills for an infection that was caused by a fungus, such as yeast. Using a steroid ointment to relieve itchiness and irritation. Separating the skin fold with a clean cotton cloth to absorb moisture and allow air to flow into the area. Follow these instructions at home: Keep the affected area clean and dry. Do not scratch your skin. Stay in a cool environment as much as possible. Use an air conditioner or fan, if available. Apply over-the-counter and prescription medicines only as told by your health care provider. If you were prescribed an antibiotic medicine, use it as told by your health care provider. Do not stop using the antibiotic even if your condition improves. Keep all follow-up visits as told by your health care provider. This is important. How is this prevented?  Maintain a healthy weight. Take care of your feet, especially if you have diabetes. Foot care includes: Wearing shoes that fit well. Keeping your feet dry. Wearing clean, breathable socks. Protect the skin around your groin and buttocks, especially if you have incontinence. Skin protection includes: Following a regular cleaning routine. Using skin protectant creams, powders, or ointments. Changing protection pads frequently. Do not wear tight clothes. Wear clothes that are loose, absorbent, and made of cotton. Wear a bra that gives good support, if needed. Shower and dry yourself well after activity or exercise. Use a hair dryer on a cool setting to dry between  skin folds, especially after you bathe. If you have diabetes, keep your blood sugar under control. Contact a health care provider if: Your symptoms do not improve with treatment. Your symptoms get worse or they spread. You notice increased redness and warmth. You have a fever. Summary Intertrigo is skin irritation or inflammation (dermatitis) that occurs when folds of skin rub together. This  condition is caused by heat, moisture, rubbing (friction), and not enough air circulation. This condition may be treated by cleaning and drying your skin and with medicines. Apply over-the-counter and prescription medicines only as told by your health care provider. Keep all follow-up visits as told by your health care provider. This is important. This information is not intended to replace advice given to you by your health care provider. Make sure you discuss any questions you have with your health care provider. Document Revised: 10/07/2017 Document Reviewed: 10/14/2017 Elsevier Patient Education  [employer].

## 2021-04-06 LAB — WOUND CULTURE
MICRO NUMBER:: 12603612
SPECIMEN QUALITY:: ADEQUATE

## 2021-04-06 LAB — HOUSE ACCOUNT TRACKING

## 2021-04-09 NOTE — Progress Notes (Signed)
Bacterial culture shows staph bacteria . Antibiotic given should help this .  If not getting better may need to refill keflex or change antibiotic .  Please update how you are doing

## 2021-04-10 MED ORDER — CEPHALEXIN 500 MG PO CAPS: 500.0000 mg | ORAL_CAPSULE | Freq: Three times a day (TID) | ORAL | 0 refills | Status: DC

## 2021-04-10 NOTE — Progress Notes (Signed)
Glad getting better   but to be safe  please send in 7 more days of Cephalexin  500 mg 1 po tid disp 21

## 2021-04-10 NOTE — Addendum Note (Signed)
Addended by: Nilda Riggs on: 04/10/2021 11:39 AM   Modules accepted: Orders

## 2021-04-11 NOTE — Progress Notes (Signed)
No need to refill fluconazole at this time but  use topical antifungal    until better

## 2021-04-12 ENCOUNTER — Ambulatory Visit (INDEPENDENT_AMBULATORY_CARE_PROVIDER_SITE_OTHER): Payer: BC Managed Care – PPO | Admitting: Physician Assistant

## 2021-04-12 ENCOUNTER — Encounter (INDEPENDENT_AMBULATORY_CARE_PROVIDER_SITE_OTHER): Payer: Self-pay | Admitting: Physician Assistant

## 2021-04-12 ENCOUNTER — Other Ambulatory Visit: Payer: Self-pay

## 2021-04-12 VITALS — BP 110/70 | HR 90 | Temp 97.8°F | Ht 63.0 in | Wt 220.0 lb

## 2021-04-12 DIAGNOSIS — Z6841 Body Mass Index (BMI) 40.0 and over, adult: Secondary | ICD-10-CM

## 2021-04-12 DIAGNOSIS — E559 Vitamin D deficiency, unspecified: Secondary | ICD-10-CM

## 2021-04-12 NOTE — Progress Notes (Signed)
Chief Complaint:   OBESITY [name] is here to discuss her progress with her obesity treatment plan along with follow-up of her obesity related diagnoses. [name] is on keeping a food journal and adhering to recommended goals of 1500 calories and 100 grams of protein daily and states she is following her eating plan approximately 95% of the time. [name] states she is doing 0 minutes 0 times per week.  Today's visit was #: 18 Starting weight: 293 lbs Starting date: 10/02/2017 Today's weight: 220 lbs Today's date: 04/12/2021 Total lbs lost to date: 73 Total lbs lost since last in-office visit: 1  Interim History: [name] reports that she is feeling better over the last few weeks. She has been trying to get 3 meals in a day, and she is hitting her protein goal more often. She is averaging 2,000 calories and 100 grams of protein daily. Her energy is better.  Subjective:   1. Vitamin D deficiency [name] is on Vit D OTC, and her last Vit D level was not at goal.  Assessment/Plan:   1. Vitamin D deficiency Low Vitamin D level contributes to fatigue and are associated with obesity, breast, and colon cancer. [name] will continue OTC Vitamin D 2,000 IU daily and will follow-up for routine testing of Vitamin D, at least 2-3 times per year to avoid over-replacement.  2. Obesity, current BMI 38.98 [name] is currently in the action stage of change. As such, her goal is to continue with weight loss efforts. She has agreed to keeping a food journal and adhering to recommended goals of 1500 calories and 100 grams of protein daily.   Exercise goals: No exercise has been prescribed at this time.  Behavioral modification strategies: planning for success and keeping a strict food journal.  [name] has agreed to follow-up with our clinic in 4 weeks. She was informed of the importance of frequent follow-up visits to maximize her success with intensive lifestyle modifications for her multiple health  conditions.   Objective:   Blood pressure 110/70, pulse 90, temperature 97.8 F (36.6 C), height 5' 3"  (1.6 m), weight 220 lb (99.8 kg), SpO2 100 %, unknown if currently breastfeeding. Body mass index is 38.97 kg/m.  General: Cooperative, alert, well developed, in no acute distress. HEENT: Conjunctivae and lids unremarkable. Cardiovascular: Regular rhythm.  Lungs: Normal work of breathing. Neurologic: No focal deficits.   Lab Results  Component Value Date   CREATININE 0.79 03/15/2021   BUN 12 03/15/2021   NA 138 03/15/2021   K 4.1 03/15/2021   CL 100 03/15/2021   CO2 23 03/15/2021   Lab Results  Component Value Date   ALT 11 03/15/2021   AST 19 03/15/2021   ALKPHOS 97 03/15/2021   BILITOT 0.3 03/15/2021   Lab Results  Component Value Date   HGBA1C 5.2 03/15/2021   HGBA1C 5.1 10/31/2020   HGBA1C 5.5 07/06/2020   HGBA1C 5.3 01/13/2020   HGBA1C 5.4 07/14/2019   Lab Results  Component Value Date   INSULIN 4.4 03/15/2021   INSULIN 4.2 10/31/2020   INSULIN 3.0 07/06/2020   INSULIN 5.2 01/13/2020   INSULIN 4.5 07/14/2019   Lab Results  Component Value Date   TSH 0.89 08/26/2019   Lab Results  Component Value Date   CHOL 166 03/15/2021   HDL 63 03/15/2021   LDLCALC 92 03/15/2021   TRIG 57 03/15/2021   CHOLHDL 2.6 03/15/2021   Lab Results  Component Value Date   VD25OH 46.2 03/15/2021  VD25OH 53.9 10/31/2020   VD25OH 55.7 07/06/2020   Lab Results  Component Value Date   WBC 16.4 (H) 12/01/2020   HGB 9.7 (L) 12/01/2020   HCT 30.1 (L) 12/01/2020   MCV 85.3 12/01/2020   PLT 247 12/01/2020   Lab Results  Component Value Date   IRON 114 07/29/2013   FERRITIN 14.8 07/29/2013   Attestation Statements:   Reviewed by clinician on day of visit: allergies, medications, problem list, medical history, surgical history, family history, social history, and previous encounter notes.  Time spent on visit including pre-visit chart review and post-visit care and  charting was 34 minutes.    Wilhemena Durie, am acting as transcriptionist for [employer], PA-C.  I have reviewed the above documentation for accuracy and completeness, and I agree with the above. Abby Potash, PA-C

## 2021-04-19 ENCOUNTER — Encounter: Payer: Self-pay | Admitting: Gastroenterology

## 2021-04-24 ENCOUNTER — Telehealth: Payer: Self-pay

## 2021-04-24 ENCOUNTER — Other Ambulatory Visit: Payer: Self-pay

## 2021-04-24 DIAGNOSIS — K5090 Crohn's disease, unspecified, without complications: Secondary | ICD-10-CM

## 2021-04-24 NOTE — Telephone Encounter (Signed)
The pt will need inflectra beginning in Feb. Need to call the Patient care center

## 2021-04-24 NOTE — Telephone Encounter (Signed)
The pt prefers to try inflectra. The Patient [name] will infuse inflectra.   Dr Ardis Hughs what dose and when should she start?  Her insurance is covering Remicade until January.  Her appt with you is on Jan 15.

## 2021-04-24 NOTE — Telephone Encounter (Signed)
[name], [place]  [facility] Phone Number: [phone]   I am okay with that.  Yes, please send the paperwork to the insurance.   Would the regular blood work show if there are any issues?  Also, does Dr. Melissa Noon office offer that medication?  Would I still receive the same dosage as I did with Remicade?        Medicine Change (Newest Message First) [name]  [facility] (supporting Milus Banister, MD) 45 minutes ago (9:20 AM)   I am okay with that.  Yes, please send the paperwork to the insurance.   Would the regular blood work show if there are any issues?  Also, does Dr. Melissa Noon office offer that medication?  Would I still receive the same dosage as I did with Remicade?    You  [name] 1 hour ago (8:52 AM)   Several of my patients have been switched to Inflectra without any troubles.  We can send a request to your insurance and see if that is covered and affordable.  Would you like me to do that?             Milus Banister, MD  You 1 hour ago (8:44 AM)   Several of my patients have been switched to Inflectra without any troubles.  Can you please contact her insurance company and let them know that it is okay with me if we switch to Inflectra at the same dosing schedule.  Thank you    You routed conversation to Milus Banister, MD 1 hour ago (8:41 AM)   [name]  [facility] (supporting Milus Banister, MD) 5 days ago   Good Evening   I received a letter from my insurance about not wanting to continue to pay for Remicade.  They have offered 2 biosimilars, Avsola & Inflectra.  What are your recommendations?  The letter says that it would continue to pay for Remicade if you think that the biosimilars would be harmful or  that they would not be as effective.  The letter said that you would need to send information to my insurance by January 1st.  I have an appointment with you on January 25th.  Thanks and Happy  Thanksgiving.

## 2021-04-24 NOTE — Telephone Encounter (Signed)
Order for inflectra has been entered for the Patient [name].  Message sent to the pt to confirm when she will be die for Remicade.

## 2021-04-24 NOTE — Telephone Encounter (Signed)
I spoke with Dr Melissa Noon office and they do not infuse inflectra.  I have sent a message to the pt via My Chart to see how the pt would like to proceed.  She can go to another infusion center or we can see if Dr Ardis Hughs is ok with [facility]

## 2021-04-24 NOTE — Progress Notes (Signed)
Chief Complaint  Patient presents with   Follow-up    HPI: [name] 40 y.o. come in for follow-up of severe intertrigo with staph secondary infection She finished the oral medication the spots went away and dried up that were on her wrist and satellite.  Itching is less nowhere near as bad however ongoing with slight worsening on the left groin area no fever or systemic symptoms. Using Zsorb and topical nystatin but ongoing.  She is post to get Remicade next week has been delayed because of antibiotic use. Denies rectovaginal symptoms Baby is fine doing well. ROS: See pertinent positives and negatives per HPI. Father just passed  44 Past Medical History:  Diagnosis Date   Abrasion of upper arm with infection, left, sequela    started antibiotics lef arm oct 4th started antibiotics healing well   Anemia    hx of 2 yrs ago   Chicken pox    Chronic rhinitis    Contact dermatitis and other eczema, due to unspecified cause    Crohn's 11-15-11   no issues in 2 yrs   Fibroids 11-15-11   remains with fibroids   Hernia    Incisional hernia, RLQ 10/02/2011   Infertility, female    Mononucleosis    Obesity    Pilonidal abscess    Rectal abscess    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HCC)    Vitamin D deficiency     Family History  Problem Relation Age of Onset   Aneurysm Mother        brain   Heart disease Mother 49       heart anyrism   Healthy Father        fairly   Diabetes Sister        half sister   Bipolar disorder Sister        half sister   Colon cancer Maternal Grandmother    Stomach cancer Paternal Grandfather    Multiple sclerosis Sister     Social History   Socioeconomic History   Marital status: Married    Spouse name: Rosali Augello   Number of children: Not on file   Years of education: Not on file   Highest education level: Bachelor's degree (e.g., BA, AB, BS)  Occupational History   Occupation: Music therapist  Tobacco Use   Smoking  status: Never   Smokeless tobacco: Never  Vaping Use   Vaping Use: Never used  Substance and Sexual Activity   Alcohol use: No   Drug use: No   Sexual activity: Yes    Partners: Male  Other Topics Concern   Not on file  Social History Narrative   married   HH of 2   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    No pets   Sleep 6-7 hours    BF with 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   Walking exercise   Eats balanced generally   Social Determinants of Health   Financial Resource Strain: Low Risk    Difficulty of Paying Living Expenses: Not hard at all  Food Insecurity: No Food Insecurity   Worried About Charity fundraiser in the Last Year: Never true   Arboriculturist in the Last Year: Never true  Transportation Needs: No Transportation Needs   Lack of Transportation (Medical): No   Lack of Transportation (Non-Medical): No  Physical Activity: Unknown   Days of Exercise per Week: 0 days   Minutes  of Exercise per Session: Not on file  Stress: No Stress Concern Present   Feeling of Stress : Not at all  Social Connections: Socially Integrated   Frequency of Communication with Friends and Family: More than three times a week   Frequency of Social Gatherings with Friends and Family: Patient refused   Attends Religious Services: More than 4 times per year   Active Member of [employer] or Organizations: Yes   Attends Archivist Meetings: 1 to 4 times per year   Marital Status: Married    Outpatient Medications Prior to Visit  Medication Sig Dispense Refill   Calcium Carb-Cholecalciferol (CALCIUM/VITAMIN D) 600-400 MG-UNIT TABS Take 1 tablet by mouth daily.     fexofenadine (ALLEGRA) 180 MG tablet Take 180 mg by mouth daily.     fluticasone (FLONASE) 50 MCG/ACT nasal spray Place 1 spray into both nostrils daily.     InFLIXimab (REMICADE IV) Inject into the vein every 8 (eight) weeks.      nystatin (MYCOSTATIN/NYSTOP) powder Apply 1 application topically 2 (two)  times daily. To effected area 30 g 2   Prenatal Vit-Fe Fumarate-FA (PRENATAL MULTIVITAMIN) TABS tablet Take 1 tablet by mouth daily at 12 noon.     Vitamin D, Cholecalciferol, 50 MCG (2000 UT) CAPS Take 2,000 Units by mouth daily.     CALCIUM PO Take by mouth daily.     cephALEXin (KEFLEX) 500 MG capsule Take 1 capsule (500 mg total) by mouth 3 (three) times daily. 21 capsule 0   fluconazole (DIFLUCAN) 150 MG tablet Take one by mouth every 72 hours 3 tablet 1   No facility-administered medications prior to visit.     EXAM:  BP 106/70 (BP Location: Left Arm, Patient Position: Sitting, Cuff Size: Normal)   Pulse 82   Temp (!) 97.3 F (36.3 C) (Temporal)   Ht 5' 3"  (1.6 m)   Wt 230 lb 6.4 oz (104.5 kg)   SpO2 99%   BMI 40.81 kg/m   Body mass index is 40.81 kg/m.  GENERAL: vitals reviewed and listed above, alert, oriented, appears well hydrated and in no acute distress HEENT: atraumatic, conjunctiva  clear, no obvious abnormalities on inspection of external nose and ears OP masked NECK: no obvious masses on inspection palpation   Suprapubic and intertriginous areas still significantly beefy red and thickened chronic but dry except in intertriginous area and one pustule right suprapubic area.  Nontender.  No obvious abscess.  Wrist spots rashes are gone patch behind left thigh is faded. LMS: moves all extremities without noticeable focal  abnormality PSYCH: pleasant and cooperative, no obvious depression or anxiety  BP Readings from Last 3 Encounters:  04/25/21 106/70  04/12/21 110/70  04/03/21 108/60    ASSESSMENT AND PLAN:  Discussed the following assessment and plan:  Erythema intertrigo - still quite active  secondary infection  retreat with diclox and diflucan  - Plan: Ambulatory referral to Dermatology  Localized bacterial skin infection - Plan: Ambulatory referral to Dermatology  Intertrigo  Crohn's disease with complication, unspecified gastrointestinal tract  location [facility]) - Plan: Ambulatory referral to Dermatology  Obesity, current BMI 38.98  High risk medication use - remicade infusions - Plan: Ambulatory referral to Dermatology Partial improvement with oral meds ENT staff and ENT yeast but persisting with slight relapse perhaps Retreat dicloxacillin 4 times daily for a week Diflucan every 3 days for 2 weeks changed to topical Nizoral Referral to dermatology as she has been battling this for over 3 months.  Continued skin care. May need to delay slightly the Remicade fortunately her IBD is quiescent. -Patient advised to return or notify health care team  if  new concerns arise.  Patient Instructions  Plan dermatology consult.   Can   restart antifungal  longer course and antibiotic .  And add different  topical  nizoral. Antifungal         [name]. Wylee Ogden M.D.

## 2021-04-25 ENCOUNTER — Ambulatory Visit: Payer: BC Managed Care – PPO | Admitting: Internal Medicine

## 2021-04-25 ENCOUNTER — Encounter: Payer: Self-pay | Admitting: Internal Medicine

## 2021-04-25 VITALS — BP 106/70 | HR 82 | Temp 97.3°F | Ht 63.0 in | Wt 230.4 lb

## 2021-04-25 DIAGNOSIS — K50919 Crohn's disease, unspecified, with unspecified complications: Secondary | ICD-10-CM

## 2021-04-25 DIAGNOSIS — L304 Erythema intertrigo: Secondary | ICD-10-CM | POA: Diagnosis not present

## 2021-04-25 DIAGNOSIS — L089 Local infection of the skin and subcutaneous tissue, unspecified: Secondary | ICD-10-CM

## 2021-04-25 DIAGNOSIS — Z79899 Other long term (current) drug therapy: Secondary | ICD-10-CM

## 2021-04-25 DIAGNOSIS — B9689 Other specified bacterial agents as the cause of diseases classified elsewhere: Secondary | ICD-10-CM

## 2021-04-25 DIAGNOSIS — Z6841 Body Mass Index (BMI) 40.0 and over, adult: Secondary | ICD-10-CM

## 2021-04-25 MED ORDER — DICLOXACILLIN SODIUM 250 MG PO CAPS: 250.0000 mg | ORAL_CAPSULE | Freq: Four times a day (QID) | ORAL | 0 refills | Status: DC

## 2021-04-25 MED ORDER — FLUCONAZOLE 150 MG PO TABS: ORAL_TABLET | ORAL | 1 refills | Status: DC

## 2021-04-25 MED ORDER — KETOCONAZOLE 2 % EX CREA: 1.0000 "application " | TOPICAL_CREAM | Freq: Two times a day (BID) | CUTANEOUS | 0 refills | Status: DC

## 2021-04-25 NOTE — Patient Instructions (Addendum)
Plan dermatology consult.   Can   restart antifungal  longer course and antibiotic .  And add different  topical  nizoral. Antifungal

## 2021-04-25 NOTE — Telephone Encounter (Signed)
The pt has been scheduled for [facility] at the Patient [name] on 07/12/21 at 9 am.  The pt will be advised via My Chart. (Pt preference)

## 2021-05-10 ENCOUNTER — Other Ambulatory Visit: Payer: Self-pay

## 2021-05-10 ENCOUNTER — Ambulatory Visit (INDEPENDENT_AMBULATORY_CARE_PROVIDER_SITE_OTHER): Payer: BC Managed Care – PPO | Admitting: Physician Assistant

## 2021-05-10 ENCOUNTER — Encounter (INDEPENDENT_AMBULATORY_CARE_PROVIDER_SITE_OTHER): Payer: Self-pay | Admitting: Physician Assistant

## 2021-05-10 VITALS — BP 102/64 | HR 69 | Temp 98.1°F | Ht 63.0 in | Wt 223.0 lb

## 2021-05-10 DIAGNOSIS — R7303 Prediabetes: Secondary | ICD-10-CM | POA: Diagnosis not present

## 2021-05-10 DIAGNOSIS — Z6841 Body Mass Index (BMI) 40.0 and over, adult: Secondary | ICD-10-CM | POA: Diagnosis not present

## 2021-05-10 NOTE — Progress Notes (Signed)
Chief Complaint:   OBESITY [name] is here to discuss her progress with her obesity treatment plan along with follow-up of her obesity related diagnoses. [name] is on keeping a food journal and adhering to recommended goals of 1500 calories and 100 grams protein and states she is following her eating plan approximately 95% of the time. [name] states she is not currently exercising.  Today's visit was #: 47 Starting weight: 293 lbs Starting date: 10/02/2017 Today's weight: 223 lbs Today's date: 05/10/2021 Total lbs lost to date: 31 Total lbs lost since last in-office visit: 0  Interim History: [name] reports the last 2 weeks have been crazy due to her father passing away. Protein average was 80 grams and daily average calories were 1800. She has been eating out more often.  Subjective:   1. Pre-diabetes [name] is not on meds and denies polyphagia. Her last A1c was 5.2.  Assessment/Plan:   1. Pre-diabetes [name] will continue to work on weight loss, exercise, and decreasing simple carbohydrates to help decrease the risk of diabetes. Continue with plan.  2. Obesity, current BMI 39.51  [name] is currently in the action stage of change. As such, her goal is to continue with weight loss efforts. She has agreed to keeping a food journal and adhering to recommended goals of 1500 calories and 100 grams protein.   Recheck IC at next visit.  Exercise goals:  As is  Behavioral modification strategies: meal planning and cooking strategies and keeping healthy foods in the home.  [name] has agreed to follow-up with our clinic in 4 weeks. She was informed of the importance of frequent follow-up visits to maximize her success with intensive lifestyle modifications for her multiple health conditions.   Objective:   Blood pressure 102/64, pulse 69, temperature 98.1 F (36.7 C), height 5' 3"  (1.6 m), weight 223 lb (101.2 kg), SpO2 98 %, unknown if currently breastfeeding. Body mass index is  39.5 kg/m.  General: Cooperative, alert, well developed, in no acute distress. HEENT: Conjunctivae and lids unremarkable. Cardiovascular: Regular rhythm.  Lungs: Normal work of breathing. Neurologic: No focal deficits.   Lab Results  Component Value Date   CREATININE 0.79 03/15/2021   BUN 12 03/15/2021   NA 138 03/15/2021   K 4.1 03/15/2021   CL 100 03/15/2021   CO2 23 03/15/2021   Lab Results  Component Value Date   ALT 11 03/15/2021   AST 19 03/15/2021   ALKPHOS 97 03/15/2021   BILITOT 0.3 03/15/2021   Lab Results  Component Value Date   HGBA1C 5.2 03/15/2021   HGBA1C 5.1 10/31/2020   HGBA1C 5.5 07/06/2020   HGBA1C 5.3 01/13/2020   HGBA1C 5.4 07/14/2019   Lab Results  Component Value Date   INSULIN 4.4 03/15/2021   INSULIN 4.2 10/31/2020   INSULIN 3.0 07/06/2020   INSULIN 5.2 01/13/2020   INSULIN 4.5 07/14/2019   Lab Results  Component Value Date   TSH 0.89 08/26/2019   Lab Results  Component Value Date   CHOL 166 03/15/2021   HDL 63 03/15/2021   LDLCALC 92 03/15/2021   TRIG 57 03/15/2021   CHOLHDL 2.6 03/15/2021   Lab Results  Component Value Date   VD25OH 46.2 03/15/2021   VD25OH 53.9 10/31/2020   VD25OH 55.7 07/06/2020   Lab Results  Component Value Date   WBC 16.4 (H) 12/01/2020   HGB 9.7 (L) 12/01/2020   HCT 30.1 (L) 12/01/2020   MCV 85.3 12/01/2020   PLT 247 12/01/2020  Lab Results  Component Value Date   IRON 114 07/29/2013   FERRITIN 14.8 07/29/2013    Attestation Statements:   Reviewed by clinician on day of visit: allergies, medications, problem list, medical history, surgical history, family history, social history, and previous encounter notes.  Time spent on visit including pre-visit chart review and post-visit care and charting was 31 minutes.   Coral Ceo, CMA, am acting as transcriptionist for [employer], PA-C.  I have reviewed the above documentation for accuracy and completeness, and I agree with the  above. Abby Potash, PA-C

## 2021-06-12 ENCOUNTER — Encounter (INDEPENDENT_AMBULATORY_CARE_PROVIDER_SITE_OTHER): Payer: Self-pay | Admitting: Physician Assistant

## 2021-06-12 ENCOUNTER — Other Ambulatory Visit: Payer: Self-pay

## 2021-06-12 ENCOUNTER — Other Ambulatory Visit: Payer: Self-pay | Admitting: Internal Medicine

## 2021-06-12 ENCOUNTER — Ambulatory Visit (INDEPENDENT_AMBULATORY_CARE_PROVIDER_SITE_OTHER): Payer: BC Managed Care – PPO | Admitting: Physician Assistant

## 2021-06-12 VITALS — BP 107/70 | HR 77 | Temp 98.4°F | Ht 63.0 in | Wt 227.0 lb

## 2021-06-12 DIAGNOSIS — Z9189 Other specified personal risk factors, not elsewhere classified: Secondary | ICD-10-CM

## 2021-06-12 DIAGNOSIS — R0602 Shortness of breath: Secondary | ICD-10-CM | POA: Diagnosis not present

## 2021-06-12 DIAGNOSIS — E559 Vitamin D deficiency, unspecified: Secondary | ICD-10-CM

## 2021-06-12 DIAGNOSIS — E669 Obesity, unspecified: Secondary | ICD-10-CM | POA: Diagnosis not present

## 2021-06-12 DIAGNOSIS — Z6841 Body Mass Index (BMI) 40.0 and over, adult: Secondary | ICD-10-CM | POA: Diagnosis not present

## 2021-06-12 NOTE — Progress Notes (Signed)
Chief Complaint:   OBESITY Roland is here to discuss her progress with her obesity treatment plan along with follow-up of her obesity related diagnoses. Lulu is on keeping a food journal and adhering to recommended goals of 1500 calories and 100 grams of protein daily and states she is following her eating plan approximately 50% of the time. Elva states she is doing 0 minutes 0 times per week.  Today's visit was #: 15 Starting weight: 293 lbs Starting date: 10/02/2017 Today's weight: 227 lbs Today's date: 06/12/2021 Total lbs lost to date: 66 Total lbs lost since last in-office visit: 0  Interim History: Makynzee has been stressed with going back to work and her daughter starting daycare. She has tried some new recipes, including air fryer carrots, spinach, cheese, and [place] meatballs. She is also doing egg muffins for breakfast. She has not been exercising.  Subjective:   1. Shortness of breath on exertion Dailee denies dizziness or lightheadedness.   2. Vitamin D deficiency Drishti is on 2,000 units daily.  3. At risk for activity intolerance Gustavo is at risk for exercise intolerance due to not exercising in months a recent childbirth.   Assessment/Plan:   1. Shortness of breath on exertion IC was done today. Ladaysha has agreed to work on weight loss and gradually increase exercise to treat her exercise induced shortness of breath. Will continue to monitor closely.  2. Vitamin D deficiency Galilee will continue Vitamin D 2,000 units daily and will follow-up for routine testing of Vitamin D, at least 2-3 times per year to avoid over-replacement.  3. At risk for activity intolerance Naveya was given approximately 15 minutes of exercise intolerance counseling today. She is 41 y.o. female and has risk factors exercise intolerance including obesity. We discussed intensive lifestyle modifications today with an emphasis on specific weight loss instructions and strategies. Taliya  will slowly increase activity as tolerated.  Repetitive spaced learning was employed today to elicit superior memory formation and behavioral change.  4. Obesity, current BMI 40.22 Tammi is currently in the action stage of change. As such, her goal is to continue with weight loss efforts. She has agreed to change to the Category 2 Plan and keeping a food journal and adhering to recommended goals of 1300 calories and 90 grams of protein daily.   Exercise goals: Exercise for 15 minutes 3 times per week.  Behavioral modification strategies: meal planning and cooking strategies and keeping healthy foods in the home.  Aime has agreed to follow-up with our clinic in 4 weeks. She was informed of the importance of frequent follow-up visits to maximize her success with intensive lifestyle modifications for her multiple health conditions.   Objective:   Blood pressure 107/70, pulse 77, temperature 98.4 F (36.9 C), temperature source Oral, height 5' 3"  (1.6 m), weight 227 lb (103 kg), SpO2 99 %, unknown if currently breastfeeding. Body mass index is 40.21 kg/m.  General: Cooperative, alert, well developed, in no acute distress. HEENT: Conjunctivae and lids unremarkable. Cardiovascular: Regular rhythm.  Lungs: Normal work of breathing. Neurologic: No focal deficits.   Lab Results  Component Value Date   CREATININE 0.79 03/15/2021   BUN 12 03/15/2021   NA 138 03/15/2021   K 4.1 03/15/2021   CL 100 03/15/2021   CO2 23 03/15/2021   Lab Results  Component Value Date   ALT 11 03/15/2021   AST 19 03/15/2021   ALKPHOS 97 03/15/2021   BILITOT 0.3 03/15/2021   Lab Results  Component Value Date   HGBA1C 5.2 03/15/2021   HGBA1C 5.1 10/31/2020   HGBA1C 5.5 07/06/2020   HGBA1C 5.3 01/13/2020   HGBA1C 5.4 07/14/2019   Lab Results  Component Value Date   INSULIN 4.4 03/15/2021   INSULIN 4.2 10/31/2020   INSULIN 3.0 07/06/2020   INSULIN 5.2 01/13/2020   INSULIN 4.5 07/14/2019   Lab  Results  Component Value Date   TSH 0.89 08/26/2019   Lab Results  Component Value Date   CHOL 166 03/15/2021   HDL 63 03/15/2021   LDLCALC 92 03/15/2021   TRIG 57 03/15/2021   CHOLHDL 2.6 03/15/2021   Lab Results  Component Value Date   VD25OH 46.2 03/15/2021   VD25OH 53.9 10/31/2020   VD25OH 55.7 07/06/2020   Lab Results  Component Value Date   WBC 16.4 (H) 12/01/2020   HGB 9.7 (L) 12/01/2020   HCT 30.1 (L) 12/01/2020   MCV 85.3 12/01/2020   PLT 247 12/01/2020   Lab Results  Component Value Date   IRON 114 07/29/2013   FERRITIN 14.8 07/29/2013   Attestation Statements:   Reviewed by clinician on day of visit: allergies, medications, problem list, medical history, surgical history, family history, social history, and previous encounter notes.   Wilhemena Durie, am acting as transcriptionist for [employer], PA-C.  I have reviewed the above documentation for accuracy and completeness, and I agree with the above. Abby Potash, PA-C

## 2021-06-21 ENCOUNTER — Telehealth: Payer: Self-pay

## 2021-06-21 ENCOUNTER — Other Ambulatory Visit (INDEPENDENT_AMBULATORY_CARE_PROVIDER_SITE_OTHER): Payer: BC Managed Care – PPO

## 2021-06-21 ENCOUNTER — Encounter: Payer: Self-pay | Admitting: Gastroenterology

## 2021-06-21 ENCOUNTER — Other Ambulatory Visit: Payer: Self-pay

## 2021-06-21 ENCOUNTER — Ambulatory Visit: Payer: BC Managed Care – PPO | Admitting: Gastroenterology

## 2021-06-21 VITALS — BP 100/58 | HR 68 | Ht 63.0 in | Wt 233.8 lb

## 2021-06-21 DIAGNOSIS — K5090 Crohn's disease, unspecified, without complications: Secondary | ICD-10-CM | POA: Diagnosis not present

## 2021-06-21 LAB — CBC WITH DIFFERENTIAL/PLATELET
Basophils Absolute: 0.1 10*3/uL (ref 0.0–0.1)
Basophils Relative: 0.7 % (ref 0.0–3.0)
Eosinophils Absolute: 0.2 10*3/uL (ref 0.0–0.7)
Eosinophils Relative: 1.8 % (ref 0.0–5.0)
HCT: 37.6 % (ref 36.0–46.0)
Hemoglobin: 12.1 g/dL (ref 12.0–15.0)
Lymphocytes Relative: 27.6 % (ref 12.0–46.0)
Lymphs Abs: 2.6 10*3/uL (ref 0.7–4.0)
MCHC: 32.2 g/dL (ref 30.0–36.0)
MCV: 88.5 fl (ref 78.0–100.0)
Monocytes Absolute: 1 10*3/uL (ref 0.1–1.0)
Monocytes Relative: 10.8 % (ref 3.0–12.0)
Neutro Abs: 5.6 10*3/uL (ref 1.4–7.7)
Neutrophils Relative %: 59.1 % (ref 43.0–77.0)
Platelets: 358 10*3/uL (ref 150.0–400.0)
RBC: 4.25 Mil/uL (ref 3.87–5.11)
RDW: 13.7 % (ref 11.5–15.5)
WBC: 9.6 10*3/uL (ref 4.0–10.5)

## 2021-06-21 LAB — COMPREHENSIVE METABOLIC PANEL
ALT: 13 U/L (ref 0–35)
AST: 17 U/L (ref 0–37)
Albumin: 3.5 g/dL (ref 3.5–5.2)
Alkaline Phosphatase: 76 U/L (ref 39–117)
BUN: 11 mg/dL (ref 6–23)
CO2: 31 mEq/L (ref 19–32)
Calcium: 9.3 mg/dL (ref 8.4–10.5)
Chloride: 101 mEq/L (ref 96–112)
Creatinine, Ser: 0.67 mg/dL (ref 0.40–1.20)
GFR: 111.05 mL/min (ref >60.00)
Glucose, Bld: 83 mg/dL (ref 70–99)
Potassium: 4.3 mEq/L (ref 3.5–5.1)
Sodium: 136 mEq/L (ref 135–145)
Total Bilirubin: 0.3 mg/dL (ref 0.2–1.2)
Total Protein: 7.5 g/dL (ref 6.0–8.3)

## 2021-06-21 MED ORDER — INFLIXIMAB-AXXQ 100 MG IV SOLR: 10.0000 mg/kg | INTRAVENOUS | Status: AC

## 2021-06-21 NOTE — Patient Instructions (Addendum)
I will call you regarding your biologic.  Your physician has requested that you come in for lab work.   You will need a follow up in 1 year.  You will get a letter at that time.  Due to recent changes in healthcare laws, you may see the results of your imaging and laboratory studies on MyChart before your provider has had a chance to review them.  We understand that in some cases there may be results that are confusing or concerning to you. Not all laboratory results come back in the same time frame and the provider may be waiting for multiple results in order to interpret others.  Please give [name] 48 hours in order for your provider to thoroughly review all the results before contacting the office for clarification of your results.

## 2021-06-21 NOTE — Telephone Encounter (Signed)
-----   Message from Timothy Lasso, RN sent at 06/21/2021  9:14 AM EST ----- Pt needs to have [place] if Dr Amil Amen can accommodate if not [facility] at the [facility]. 10 mg/kg every 8 weeks.

## 2021-06-21 NOTE — Progress Notes (Signed)
Review of gastrointestinal problems:  1. Crohn ileocolitis. Presented like an acute appendicitis 2006. Surgery, not in [place], was much more suggestive of Crohn's disease. 02/2005 colonoscopy Dr. Ardis Hughs: rectum to cecum inflammation (erosions, granularity, TI nodular, friable, ulcerated. Path showed chronic, active TI inflammation; normal colon biopsies.  She did not respond to immunomodulators and so eventually she Began Remicade April 2007 While continuing on azathioprine. Early 2008, stopped azathioprine and she tolerated solo maintenance Remicade (5 mg per kilogram) well. April 2009 continues to tolerate Remicade infusions very well, getting her infusions at Dr. Tanna Furry office. 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 Her Remicade dosing was increased to 10 mg per kilogram. Winter, 2010 presented with right lower quadrant abscess, clear fistula connection between small bowel and the abscess. 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 Infection was drained with interventional radiology, drain eventually pulled however abscess recurred within days and so right lower quadrant drain was replaced. January, 2011: she is on azathioprine 100 mg a day, Augmentin twice daily, planning for surgery late January. January, 2011: ileocecectomy performed for active disease, persistent fistula, abscess. April, 2012 doing well on Remicade every 8 weeks.   10/2012 doing well.  01/2016 doing well on remicade 35m/kg every 8 weeks Dr. BMelissa Noonoffice.  Colonoscopy Dr. JArdis Hughs10/2018 showed normal, widely patent ileocecal anastomosis.  A few small erosions were noted just proximal to the IC anastomosis.  The colon was normal.  Pathology showed mild colonic inflammation in the small bowel only.  Colon biopsies were normal. TB quant gold October 2019 was negative 2. May, 2011: polynidol cyst drained  by urgent care; drained another time 2011. eventually surgically resected spring 2012 3. Morbid obesity; BMI 49 2017; BMI 53 2018 BMI 47 2019;  4.  July 2020 open repair of ventral incisional hernia, also primary repair of left split galea hernia Dr. GHarlow Asa HPI: This is a very pleasant 41year old woman   I last saw [name] over a year ago.  She was on Remicade 10 mg/kg every 8 weeks and has been for many years.  She was having some loose stools for the past 5 days leading into her routine office visit.  Blood test and stool test plan away from active Crohn's disease.  Possibly her symptoms were from IVF related hormonal changes, metformin started for IVF process.  Since then her Remicade has been changed to IRancho Cal[place]asper her insurance company dictates however she has not started the IPetroleumyet because she is having some difficulty with a new infusion center.  She has had a baby since then as well, delivered by C-section.  Baby girl named [name] surgeon  This was complicated by postop wound infection which she is still battling  She has gained about 15 pounds since her last office visit as well.  Same scale here in our office  She has had no troubles with her bowels.  No significant diarrhea, no abdominal pains, no bleeding.   ROS: complete GI ROS as described in HPI, all other review negative.  Constitutional:  No unintentional weight loss   Past Medical History:  Diagnosis Date   Abrasion of upper arm with infection, left, sequela    started antibiotics lef arm oct 4th started antibiotics healing well   Anemia    hx of 2 yrs ago   Chicken pox    Chronic rhinitis    Contact  dermatitis and other eczema, due to unspecified cause    Crohn's 11-15-11   no issues in 2 yrs   Fibroids 11-15-11   remains with fibroids   Hernia    Incisional hernia, RLQ 10/02/2011   Infertility, female    Mononucleosis    Obesity    Pilonidal abscess    Rectal abscess    Regional enteritis of small  intestine ([place])    Vitamin D deficiency     Past Surgical History:  Procedure Laterality Date   APPENDECTOMY  7/06   CESAREAN SECTION N/A 11/30/2020   Procedure: CESAREAN SECTION;  Surgeon: Everett Graff, MD;  Location: [facility];  Service: Obstetrics;  Laterality: N/A;   COLONOSCOPY WITH PROPOFOL N/A 03/07/2017   Procedure: COLONOSCOPY WITH PROPOFOL;  Surgeon: Milus Banister, MD;  Location: [facility];  Service: Endoscopy;  Laterality: N/A;   EYE SURGERY  11-15-11   2003-multiple stys   ileocecal resection  1/11   crohns disease with fisula/stricture. Dr. Ronnald Collum   INCISIONAL HERNIA REPAIR N/A 12/24/2018   Procedure: OPEN REPAIR VENTRAL INCISIONAL HERNIA WITH MESH PATCH AND PRIMARY REPAIR OF LEFT SPIGELIAN HERNIA;  Surgeon: Armandina Gemma, MD;  Location: [facility];  Service: General;  Laterality: N/A;   KNEE ARTHROSCOPY  99, 02   Bil.   MYOMECTOMY N/A 11/10/2014   Procedure: MYOMECTOMY;  Surgeon: Everett Graff, MD;  Location: [facility];  Service: Gynecology;  Laterality: N/A;   PILONIDAL CYST EXCISION  2011, 2012   I&Ds   VENTRAL HERNIA REPAIR  11/27/2011   Procedure: LAPAROSCOPIC VENTRAL HERNIA;  Surgeon: Adin Hector, MD;  Location: [facility];  Service: General;  Laterality: N/A;  Laparoscopic Exploration & Repair of Hernia in Abdomen   WISDOM TOOTH EXTRACTION      Current Outpatient Medications  Medication Sig Dispense Refill   Calcium Carb-Cholecalciferol (CALCIUM/VITAMIN D) 600-400 MG-UNIT TABS Take 1 tablet by mouth daily.     fexofenadine (ALLEGRA) 180 MG tablet Take 180 mg by mouth daily.     fluticasone (FLONASE) 50 MCG/ACT nasal spray Place 1 spray into both nostrils daily.     InFLIXimab (REMICADE IV) Inject into the vein every 8 (eight) weeks.      Prenatal Vit-Fe Fumarate-FA (PRENATAL MULTIVITAMIN) TABS tablet Take 1 tablet by mouth daily at 12 noon.     Vitamin D, Cholecalciferol, 50 MCG (2000 UT) CAPS Take 2,000 Units by mouth daily.     nystatin-triamcinolone ointment  (MYCOLOG) Apply topically 2 (two) times daily.     No current facility-administered medications for this visit.    Allergies as of 06/21/2021   (No Known Allergies)    Family History  Problem Relation Age of Onset   Aneurysm Mother        brain   Heart disease Mother 88       heart anyrism   Healthy Father        fairly   Diabetes Sister        half sister   Bipolar disorder Sister        half sister   Colon cancer Maternal Grandmother    Stomach cancer Paternal Grandfather    Multiple sclerosis Sister     Social History   Socioeconomic History   Marital status: Married    Spouse name: [name]   Number of children: Not on file   Years of education: Not on file   Highest education level: Bachelor's degree (e.g., BA, AB, BS)  Occupational History  Occupation: Music therapist  Tobacco Use   Smoking status: Never   Smokeless tobacco: Never  Vaping Use   Vaping Use: Never used  Substance and Sexual Activity   Alcohol use: No   Drug use: No   Sexual activity: Yes    Partners: Male  Other Topics Concern   Not on file  Social History Narrative   married   HH of 2   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    No pets   Sleep 6-7 hours    BF with 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   Walking exercise   Eats balanced generally   Social Determinants of Health   Financial Resource Strain: Low Risk    Difficulty of Paying Living Expenses: Not hard at all  Food Insecurity: No Food Insecurity   Worried About Charity fundraiser in the Last Year: Never true   Arboriculturist in the Last Year: Never true  Transportation Needs: No Transportation Needs   Lack of Transportation (Medical): No   Lack of Transportation (Non-Medical): No  Physical Activity: Unknown   Days of Exercise per Week: 0 days   Minutes of Exercise per Session: Not on file  Stress: No Stress Concern Present   Feeling of Stress : Not at all  Social Connections: Socially Integrated    Frequency of Communication with Friends and Family: More than three times a week   Frequency of Social Gatherings with Friends and Family: Patient refused   Attends Religious Services: More than 4 times per year   Active Member of [employer] or Organizations: Yes   Attends Archivist Meetings: 1 to 4 times per year   Marital Status: Married  Human resources officer Violence: Not on file     Physical Exam: BP (!) 100/58    Pulse 68    Ht 5' 3"  (1.6 m)    Wt 233 lb 12.8 oz (106.1 kg)    BMI 41.42 kg/m  Constitutional: generally well-appearing Psychiatric: alert and oriented x3 Abdomen: soft, nontender, nondistended, no obvious ascites, no peritoneal signs, normal bowel sounds No peripheral edema noted in lower extremities  Assessment and plan: 41 y.o. female with Crohn's ileocolitis  Her Crohn's disease has been under good control on Remicade infliximab 10 mg/kg every 8 weeks.  Her insurance company required that she switch to brand-name Avsola or brand-name Inflectra.  We chose Inflectra however this requires her to change to a different infusion center.  She has been getting the run around with scheduling her first infusion there and is a bit frustrated with them understandably.  She wonders if she can instead use Avsola and get it done at her previous infusion center through Dr. Amil Amen.  This to me seems ideal as well.  We will see if that can be done.  If not then she will stick with new Inflectra and we will help iron out any scheduling difficulties she is having with the new infusion center.  She needs basic labs today including CBC, complete metabolic profile and TB quant gold.  She will return to see me in 1 year.  She will stay on infliximab 10 mg/kg every 8 weeks  Please see the "Patient Instructions" section for addition details about the plan.  Owens Loffler, MD [facility] 06/21/2021, 8:56 AM   Total time on date of encounter was 35 minutes (this included time  spent preparing to see the patient reviewing records; obtaining and/or reviewing separately obtained  history; performing a medically appropriate exam and/or evaluation; counseling and educating the patient and family if present; ordering medications, tests or procedures if applicable; and documenting clinical information in the health record).

## 2021-06-21 NOTE — Telephone Encounter (Signed)
New order for Avsola has been faxed to [facility]  Phone message also left to reach out to our office if I need to do anything else.

## 2021-06-24 LAB — QUANTIFERON-TB GOLD PLUS
Mitogen-NIL: >10 IU/mL
NIL: 0.03 IU/mL
QuantiFERON-TB Gold Plus: NEGATIVE
TB1-NIL: 0.02 IU/mL
TB2-NIL: 0.04 IU/mL

## 2021-07-07 ENCOUNTER — Telehealth: Payer: Self-pay | Admitting: Gastroenterology

## 2021-07-07 NOTE — Telephone Encounter (Signed)
The pt had Avesola with Dr Amil Amen so the appt for Inflectra at [facility] has been cancelled.  The pt has been advised

## 2021-07-07 NOTE — Telephone Encounter (Signed)
Inbound call from patient states she already had infusion with Dr. Amil Amen so the appt for 2/15 can be cancelled.

## 2021-07-12 ENCOUNTER — Encounter (HOSPITAL_COMMUNITY): Payer: BC Managed Care – PPO

## 2021-07-12 ENCOUNTER — Encounter (INDEPENDENT_AMBULATORY_CARE_PROVIDER_SITE_OTHER): Payer: Self-pay

## 2021-07-13 ENCOUNTER — Ambulatory Visit (INDEPENDENT_AMBULATORY_CARE_PROVIDER_SITE_OTHER): Payer: BC Managed Care – PPO | Admitting: Physician Assistant

## 2021-07-13 ENCOUNTER — Ambulatory Visit (INDEPENDENT_AMBULATORY_CARE_PROVIDER_SITE_OTHER): Payer: BC Managed Care – PPO | Admitting: Family Medicine

## 2021-08-10 ENCOUNTER — Other Ambulatory Visit: Payer: Self-pay

## 2021-08-10 ENCOUNTER — Ambulatory Visit (INDEPENDENT_AMBULATORY_CARE_PROVIDER_SITE_OTHER): Payer: BC Managed Care – PPO | Admitting: Physician Assistant

## 2021-08-10 ENCOUNTER — Encounter (INDEPENDENT_AMBULATORY_CARE_PROVIDER_SITE_OTHER): Payer: Self-pay | Admitting: Physician Assistant

## 2021-08-10 VITALS — BP 103/70 | HR 90 | Temp 97.5°F | Ht 63.0 in | Wt 233.0 lb

## 2021-08-10 DIAGNOSIS — R7303 Prediabetes: Secondary | ICD-10-CM

## 2021-08-10 DIAGNOSIS — E669 Obesity, unspecified: Secondary | ICD-10-CM | POA: Diagnosis not present

## 2021-08-10 DIAGNOSIS — Z6841 Body Mass Index (BMI) 40.0 and over, adult: Secondary | ICD-10-CM

## 2021-08-10 MED ORDER — METFORMIN HCL 500 MG PO TABS: 500.0000 mg | ORAL_TABLET | Freq: Every day | ORAL | 0 refills | Status: DC

## 2021-08-15 NOTE — Progress Notes (Signed)
? ? ? ?Chief Complaint:  ? ?OBESITY ?[name] is here to discuss her progress with her obesity treatment plan along with follow-up of her obesity related diagnoses. [name] is on the Category 2 Plan and keeping a food journal and adhering to recommended goals of 1300 calories and 90 grams of protein and states she is following her eating plan approximately 10% of the time. [name] states she is doing 0 minutes 0 times per week. ? ?Today's visit was #: 56 ?Starting weight: 293 lbs ?Starting date: 10/02/2017 ?Today's weight: 233 lbs ?Today's date: 08/10/2021 ?Total lbs lost to date: 60 lbs ?Total lbs lost since last in-office visit: 0 ? ?Interim History: [name]  has not been journaling but has been better with breakfast. She is doing breakfast muffins, tuna for lunch and meat and vegetables for dinner. She is eating out 2 times per week.  ? ?Subjective:  ? ?1. Pre-diabetes ?[name] is not taking anything currently. She is unsure if she is challenged with hunger or cravings. She is not breast feeding. ? ?2. At risk for diabetes mellitus ?[name] is at higher than average risk for developing diabetes due to her obesity.  ? ?Assessment/Plan:  ? ?1. Pre-diabetes ?[name] agrees to start Metformin 500 mg with lunch for 1 month with no refills. She will continue to work on weight loss, exercise, and decreasing simple carbohydrates to help decrease the risk of diabetes.  ? ?- metFORMIN (GLUCOPHAGE) 500 MG tablet; Take 1 tablet (500 mg total) by mouth daily with lunch.  Dispense: 30 tablet; Refill: 0 ? ?2. At risk for diabetes mellitus ?[name] was given approximately 15 minutes of diabetic education and counseling today. We discussed intensive lifestyle modifications today with an emphasis on weight loss as well as increasing exercise and decreasing simple carbohydrates in her diet. We also reviewed medication options with an emphasis on risk versus benefits of those discussed. ? ?Repetitive spaced learning was employed today to  elicit superior memory formation and behavioral change.  ? ?3. Obesity, current BMI 41.28 ?[name] is currently in the action stage of change. As such, her goal is to continue with weight loss efforts. She has agreed to keeping a food journal and adhering to recommended goals of 1300 calories and 90 grams of protein daily.  ? ?Exercise goals: No exercise has been prescribed at this time. ? ?Behavioral modification strategies: decreasing eating out, meal planning and cooking strategies, and keeping healthy foods in the home. ? ?[name] has agreed to follow-up with our clinic in 4 weeks. She was informed of the importance of frequent follow-up visits to maximize her success with intensive lifestyle modifications for her multiple health conditions.  ? ?Objective:  ? ?Blood pressure 103/70, pulse 90, temperature (!) 97.5 ?F (36.4 ?C), height 5' 3"  (1.6 m), weight 233 lb (105.7 kg), SpO2 96 %, unknown if currently breastfeeding. ?Body mass index is 41.27 kg/m?. ? ?General: Cooperative, alert, well developed, in no acute distress. ?HEENT: Conjunctivae and lids unremarkable. ?Cardiovascular: Regular rhythm.  ?Lungs: Normal work of breathing. ?Neurologic: No focal deficits.  ? ?Lab Results  ?Component Value Date  ? CREATININE 0.67 06/21/2021  ? BUN 11 06/21/2021  ? NA 136 06/21/2021  ? K 4.3 06/21/2021  ? CL 101 06/21/2021  ? CO2 31 06/21/2021  ? ?Lab Results  ?Component Value Date  ? ALT 13 06/21/2021  ? AST 17 06/21/2021  ? ALKPHOS 76 06/21/2021  ? BILITOT 0.3 06/21/2021  ? ?Lab Results  ?Component Value Date  ? HGBA1C 5.2  03/15/2021  ? HGBA1C 5.1 10/31/2020  ? HGBA1C 5.5 07/06/2020  ? HGBA1C 5.3 01/13/2020  ? HGBA1C 5.4 07/14/2019  ? ?Lab Results  ?Component Value Date  ? INSULIN 4.4 03/15/2021  ? INSULIN 4.2 10/31/2020  ? INSULIN 3.0 07/06/2020  ? INSULIN 5.2 01/13/2020  ? INSULIN 4.5 07/14/2019  ? ?Lab Results  ?Component Value Date  ? TSH 0.89 08/26/2019  ? ?Lab Results  ?Component Value Date  ? CHOL 166 03/15/2021  ?  HDL 63 03/15/2021  ? [place] 92 03/15/2021  ? TRIG 57 03/15/2021  ? CHOLHDL 2.6 03/15/2021  ? ?Lab Results  ?Component Value Date  ? VD25OH 46.2 03/15/2021  ? VD25OH 53.9 10/31/2020  ? VD25OH 55.7 07/06/2020  ? ?Lab Results  ?Component Value Date  ? WBC 9.6 06/21/2021  ? HGB 12.1 06/21/2021  ? HCT 37.6 06/21/2021  ? MCV 88.5 06/21/2021  ? PLT 358.0 06/21/2021  ? ?Lab Results  ?Component Value Date  ? IRON 114 07/29/2013  ? FERRITIN 14.8 07/29/2013  ? ?Attestation Statements:  ? ?Reviewed by clinician on day of visit: allergies, medications, problem list, medical history, surgical history, family history, social history, and previous encounter notes. ? ?I, [name], am acting as Location manager for [employer], PA-C. ? ?I have reviewed the above documentation for accuracy and completeness, and I agree with the above. Donna Potash, PA-C ? ?

## 2021-09-07 ENCOUNTER — Telehealth (INDEPENDENT_AMBULATORY_CARE_PROVIDER_SITE_OTHER): Payer: BC Managed Care – PPO | Admitting: Physician Assistant

## 2021-09-07 DIAGNOSIS — E669 Obesity, unspecified: Secondary | ICD-10-CM | POA: Diagnosis not present

## 2021-09-07 DIAGNOSIS — R7303 Prediabetes: Secondary | ICD-10-CM

## 2021-09-07 DIAGNOSIS — Z6841 Body Mass Index (BMI) 40.0 and over, adult: Secondary | ICD-10-CM

## 2021-09-07 MED ORDER — METFORMIN HCL 500 MG PO TABS: 500.0000 mg | ORAL_TABLET | Freq: Every day | ORAL | 0 refills | Status: DC

## 2021-09-14 ENCOUNTER — Other Ambulatory Visit (INDEPENDENT_AMBULATORY_CARE_PROVIDER_SITE_OTHER): Payer: Self-pay | Admitting: Physician Assistant

## 2021-09-14 DIAGNOSIS — R7303 Prediabetes: Secondary | ICD-10-CM

## 2021-09-19 NOTE — Progress Notes (Signed)
? ? ?TeleHealth Visit:  ?Due to the COVID-19 pandemic, this visit was completed with telemedicine (audio/video) technology to reduce patient and provider exposure as well as to preserve personal protective equipment.  ? ?[name] has verbally consented to this TeleHealth visit. The patient is located at home, the provider is located at the [facility] office. The participants in this visit include the listed provider and patient. The visit was conducted today via video. ? ?Chief Complaint: OBESITY ?[name] is here to discuss her progress with her obesity treatment plan along with follow-up of her obesity related diagnoses. [name] is on keeping a food journal and adhering to recommended goals of 1300 calories and 90 grams of protein and states she is following her eating plan approximately 95% of the time. [name] states she is doing 0 minutes 0 times per week. ? ?Today's visit was #: 57 ?Starting weight: 293 lbs ?Starting date: 10/02/2017 ? ?Interim History: [name] is at home today due to having a sinus infection and her baby being sick. She has been journaling consistently. Her protein average 100 grams daily and calories aver 1983. ? ?Subjective:  ? ?1. Pre-diabetes ?[name]'s last A1C was 5.2. She is taking Metformin at lunch which is helping with cravings.  ? ?Assessment/Plan:  ? ?1. Pre-diabetes ?[name] will continue to work on weight loss, exercise, and decreasing simple carbohydrates to help decrease the risk of diabetes. We will refill Metformin 500 mg for 1 month with no refills.  ? ?- metFORMIN (GLUCOPHAGE) 500 MG tablet; Take 1 tablet (500 mg total) by mouth daily with lunch.  Dispense: 30 tablet; Refill: 0 ? ?2. Obesity, current BMI 41.28 ?[name] is currently in the action stage of change. As such, her goal is to continue with weight loss efforts. She has agreed to keeping a food journal and adhering to recommended goals of 1300 calories and 90 grams of protein daily.  ? ?Exercise goals: No  exercise has been prescribed at this time. ? ?Behavioral modification strategies: decreasing simple carbohydrates and meal planning and cooking strategies. ? ?Asees has agreed to follow-up with our clinic in 4 weeks. She was informed of the importance of frequent follow-up visits to maximize her success with intensive lifestyle modifications for her multiple health conditions. ? ?Objective:  ? ?VITALS: Per patient if applicable, see vitals. ?GENERAL: Alert and in no acute distress. ?CARDIOPULMONARY: No increased WOB. Speaking in clear sentences.  ?PSYCH: Pleasant and cooperative. Speech normal rate and rhythm. Affect is appropriate. Insight and judgement are appropriate. Attention is focused, linear, and appropriate.  ?NEURO: Oriented as arrived to appointment on time with no prompting.  ? ?Lab Results  ?Component Value Date  ? CREATININE 0.67 06/21/2021  ? BUN 11 06/21/2021  ? NA 136 06/21/2021  ? K 4.3 06/21/2021  ? CL 101 06/21/2021  ? CO2 31 06/21/2021  ? ?Lab Results  ?Component Value Date  ? ALT 13 06/21/2021  ? AST 17 06/21/2021  ? ALKPHOS 76 06/21/2021  ? BILITOT 0.3 06/21/2021  ? ?Lab Results  ?Component Value Date  ? HGBA1C 5.2 03/15/2021  ? HGBA1C 5.1 10/31/2020  ? HGBA1C 5.5 07/06/2020  ? HGBA1C 5.3 01/13/2020  ? HGBA1C 5.4 07/14/2019  ? ?Lab Results  ?Component Value Date  ? INSULIN 4.4 03/15/2021  ? INSULIN 4.2 10/31/2020  ? INSULIN 3.0 07/06/2020  ? INSULIN 5.2 01/13/2020  ? INSULIN 4.5 07/14/2019  ? ?Lab Results  ?Component Value Date  ? TSH 0.89 08/26/2019  ? ?Lab Results  ?Component  Value Date  ? CHOL 166 03/15/2021  ? HDL 63 03/15/2021  ? [place] 92 03/15/2021  ? TRIG 57 03/15/2021  ? CHOLHDL 2.6 03/15/2021  ? ?Lab Results  ?Component Value Date  ? VD25OH 46.2 03/15/2021  ? VD25OH 53.9 10/31/2020  ? VD25OH 55.7 07/06/2020  ? ?Lab Results  ?Component Value Date  ? WBC 9.6 06/21/2021  ? HGB 12.1 06/21/2021  ? HCT 37.6 06/21/2021  ? MCV 88.5 06/21/2021  ? PLT 358.0 06/21/2021  ? ?Lab Results   ?Component Value Date  ? IRON 114 07/29/2013  ? FERRITIN 14.8 07/29/2013  ? ? ?Attestation Statements:  ? ?Reviewed by clinician on day of visit: allergies, medications, problem list, medical history, surgical history, family history, social history, and previous encounter notes. ? ?I, Tonye Pearson, am acting as Location manager for [employer], PA-C. ? ?I have reviewed the above documentation for accuracy and completeness, and I agree with the above. Abby Potash, PA-C ?  ?

## 2021-10-11 ENCOUNTER — Ambulatory Visit (INDEPENDENT_AMBULATORY_CARE_PROVIDER_SITE_OTHER): Payer: BC Managed Care – PPO | Admitting: Physician Assistant

## 2021-10-16 ENCOUNTER — Other Ambulatory Visit (INDEPENDENT_AMBULATORY_CARE_PROVIDER_SITE_OTHER): Payer: Self-pay | Admitting: Physician Assistant

## 2021-10-16 DIAGNOSIS — R7303 Prediabetes: Secondary | ICD-10-CM

## 2021-11-25 ENCOUNTER — Other Ambulatory Visit (INDEPENDENT_AMBULATORY_CARE_PROVIDER_SITE_OTHER): Payer: Self-pay | Admitting: Physician Assistant

## 2021-11-25 DIAGNOSIS — R7303 Prediabetes: Secondary | ICD-10-CM

## 2021-12-05 ENCOUNTER — Ambulatory Visit (INDEPENDENT_AMBULATORY_CARE_PROVIDER_SITE_OTHER): Payer: BC Managed Care – PPO | Admitting: Internal Medicine

## 2021-12-05 ENCOUNTER — Encounter: Payer: Self-pay | Admitting: Internal Medicine

## 2021-12-05 ENCOUNTER — Ambulatory Visit (INDEPENDENT_AMBULATORY_CARE_PROVIDER_SITE_OTHER): Payer: BC Managed Care – PPO | Admitting: Bariatrics

## 2021-12-05 ENCOUNTER — Encounter (INDEPENDENT_AMBULATORY_CARE_PROVIDER_SITE_OTHER): Payer: Self-pay | Admitting: Bariatrics

## 2021-12-05 VITALS — BP 93/62 | HR 84 | Temp 98.0°F | Ht 63.0 in | Wt 249.0 lb

## 2021-12-05 VITALS — BP 120/78 | HR 91 | Temp 98.6°F | Ht 63.0 in | Wt 252.2 lb

## 2021-12-05 DIAGNOSIS — E669 Obesity, unspecified: Secondary | ICD-10-CM | POA: Diagnosis not present

## 2021-12-05 DIAGNOSIS — Z79899 Other long term (current) drug therapy: Secondary | ICD-10-CM | POA: Diagnosis not present

## 2021-12-05 DIAGNOSIS — Z72820 Sleep deprivation: Secondary | ICD-10-CM

## 2021-12-05 DIAGNOSIS — R7303 Prediabetes: Secondary | ICD-10-CM | POA: Diagnosis not present

## 2021-12-05 DIAGNOSIS — K50919 Crohn's disease, unspecified, with unspecified complications: Secondary | ICD-10-CM

## 2021-12-05 DIAGNOSIS — Z6841 Body Mass Index (BMI) 40.0 and over, adult: Secondary | ICD-10-CM

## 2021-12-05 DIAGNOSIS — Z0000 Encounter for general adult medical examination without abnormal findings: Secondary | ICD-10-CM | POA: Diagnosis not present

## 2021-12-05 DIAGNOSIS — Z7984 Long term (current) use of oral hypoglycemic drugs: Secondary | ICD-10-CM

## 2021-12-05 MED ORDER — METFORMIN HCL 500 MG PO TABS: 500.0000 mg | ORAL_TABLET | Freq: Two times a day (BID) | ORAL | 0 refills | Status: DC

## 2021-12-05 NOTE — Progress Notes (Signed)
Chief Complaint  Patient presents with   Annual Exam    Not Fasting     HPI: Patient  [name]  41 y.o. comes in today for Preventive Health Care visit   Weight management : back   under care :cause had gained weight  to get back on metformin bid   . Staph infection finally cleared up.   Topicals  Crohns  stable  getting  every 2 mos infusions  no se noted  Gyne   periods  back to normal .   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 30 min no assoc sx  no recurrance    Past Medical History:  Diagnosis Date   Abrasion of upper arm with infection, left, sequela    started antibiotics lef arm oct 4th started antibiotics healing well   Anemia    hx of 2 yrs ago   Chicken pox    Chronic rhinitis    Contact dermatitis and other eczema, due to unspecified cause    Crohn's 11-15-11   no issues in 2 yrs   Fibroids 11-15-11   remains with fibroids   Hernia    Incisional hernia, RLQ 10/02/2011   Infertility, female    Mononucleosis    Obesity    Pilonidal abscess    Rectal abscess    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[place])    Vitamin D deficiency     Past Surgical History:  Procedure Laterality Date   APPENDECTOMY  7/06   CESAREAN SECTION N/A 11/30/2020   Procedure: CESAREAN SECTION;  Surgeon: Everett Graff, MD;  Location: [facility];  Service: Obstetrics;  Laterality: N/A;   COLONOSCOPY WITH PROPOFOL N/A 03/07/2017   Procedure: COLONOSCOPY WITH PROPOFOL;  Surgeon: Milus Banister, MD;  Location: [facility];  Service: Endoscopy;  Laterality: N/A;   EYE SURGERY  11-15-11   2003-multiple stys   ileocecal resection  1/11   crohns disease with fisula/stricture. Dr. Ronnald Collum   INCISIONAL HERNIA REPAIR N/A 12/24/2018   Procedure: OPEN REPAIR VENTRAL INCISIONAL HERNIA WITH MESH PATCH AND PRIMARY REPAIR OF LEFT SPIGELIAN HERNIA;  Surgeon: Armandina Gemma, MD;  Location: [facility];  Service: General;  Laterality: N/A;   KNEE ARTHROSCOPY  99, 02   Bil.   MYOMECTOMY N/A 11/10/2014   Procedure: MYOMECTOMY;  Surgeon: Everett Graff, MD;  Location: [facility];  Service: Gynecology;  Laterality: N/A;   PILONIDAL CYST EXCISION  2011, 2012   I&Ds   VENTRAL HERNIA REPAIR  11/27/2011   Procedure: LAPAROSCOPIC VENTRAL HERNIA;  Surgeon: Adin Hector, MD;  Location: [facility];  Service: General;  Laterality: N/A;  Laparoscopic Exploration & Repair of Hernia in Abdomen  WISDOM TOOTH EXTRACTION      Family History  Problem Relation Age of Onset   Aneurysm Mother        brain   Heart disease Mother 24       heart anyrism   Healthy Father        fairly   Diabetes Sister        half sister   Bipolar disorder Sister        half sister   Colon cancer Maternal Grandmother    Stomach cancer Paternal Grandfather    Multiple sclerosis Sister     Social History   Socioeconomic History   Marital status: Married    Spouse name: Lutisha Knoche   Number of children: Not on file   Years of education: Not on file   Highest education level: Bachelor's degree (e.g., BA, AB, BS)  Occupational History   Occupation: Music therapist  Tobacco Use   Smoking status: Never   Smokeless tobacco: Never  Vaping Use   Vaping Use: Never used  Substance and Sexual Activity   Alcohol use: No   Drug use: No   Sexual activity: Yes     Partners: Male  Other Topics Concern   Not on file  Social History Narrative   married   [place] of 2   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    No pets   Sleep 6-7 hours    BF with 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   Walking exercise   Eats balanced generally   Social Determinants of Health   Financial Resource Strain: Low Risk  (04/02/2021)   Overall Financial Resource Strain (CARDIA)    Difficulty of Paying Living Expenses: Not hard at all  Food Insecurity: No Food Insecurity (04/02/2021)   Hunger Vital Sign    Worried About Running Out of Food in the Last Year: Never true    [place] in the Last Year: Never true  Transportation Needs: No Transportation Needs (04/02/2021)   PRAPARE - Hydrologist (Medical): No    Lack of Transportation (Non-Medical): No  Physical Activity: Unknown (04/02/2021)   Exercise Vital Sign    Days of Exercise per Week: 0 days    Minutes of Exercise per Session: Not on file  Stress: No Stress Concern Present (04/02/2021)   [place]    Feeling of Stress : Not at all  Social Connections: [place] (04/02/2021)   Social Connection and Isolation Panel [NHANES]    Frequency of Communication with Friends and Family: More than three times a week    Frequency of Social Gatherings with Friends and Family: Patient refused    Attends Religious Services: More than 4 times per year    Active Member of [employer] or Organizations: Yes    Attends Archivist Meetings: 1 to 4 times per year    Marital Status: Married    Outpatient Medications Prior to Visit  Medication Sig Dispense Refill   Calcium Carb-Cholecalciferol (CALCIUM/VITAMIN D) 600-400 MG-UNIT TABS Take 1 tablet by mouth daily.     fexofenadine (ALLEGRA) 180 MG tablet Take 180 mg by mouth daily.     fluticasone (FLONASE) 50 MCG/ACT nasal spray Place 1 spray into both nostrils  daily.     metFORMIN (GLUCOPHAGE) 500 MG tablet Take 1 tablet (500 mg total) by mouth 2 (two) times daily with a meal. 60 tablet 0  nystatin-triamcinolone ointment (MYCOLOG) Apply topically 2 (two) times daily.     Prenatal Vit-Fe Fumarate-FA (PRENATAL MULTIVITAMIN) TABS tablet Take 1 tablet by mouth daily at 12 noon.     Vitamin D, Cholecalciferol, 50 MCG (2000 UT) CAPS Take 2,000 Units by mouth daily.     No facility-administered medications prior to visit.     EXAM:  BP 120/78 (BP Location: Right Arm, Patient Position: Sitting, Cuff Size: Normal)   Pulse 91   Temp 98.6 F (37 C) (Oral)   Ht 5' 3"  (1.6 m)   Wt 252 lb 3.2 oz (114.4 kg)   LMP 11/08/2021   SpO2 98%   Breastfeeding No   BMI 44.68 kg/m   Body mass index is 44.68 kg/m. Wt Readings from Last 3 Encounters:  12/05/21 252 lb 3.2 oz (114.4 kg)  12/05/21 249 lb (112.9 kg)  08/10/21 233 lb (105.7 kg)    Physical Exam: Vital signs reviewed MLJ:QGBE is a well-developed well-nourished alert cooperative    who appearsr stated age in no acute distress.  HEENT: normocephalic atraumatic , Eyes: PERRL EOM's full, conjunctiva clear, Nares: paten,t no deformity discharge or tenderness., Ears: no deformity EAC's clear TMs with normal landmarks.  NECK: supple without masses, thyromegaly or bruits. CHEST/PULM:  Clear to auscultation and percussion breath sounds equal no wheeze , rales or rhonchi. No chest wall deformities or tenderness. Breast: normal by inspection . No dimpling, discharge, masses, tenderness or discharge  CV: PMI is nondisplaced, S1 S2 no gallops, murmurs, rubs. Peripheral pulses are full without delay.  ABDOMEN: Bowel sounds normal nontender  No guard or rebound, no hepato splenomegal no CVA tenderness.   Extremtities:  No clubbing cyanosis or edema, no acute joint swelling or redness no focal atrophy NEURO:  Oriented x3, cranial nerves 3-12 appear to be intact, no obvious focal weakness,gait within normal  limits no abnormal reflexes or asymmetrical SKIN: No acute rashes normal turgor, color, no bruising or petechiae. PSYCH: Oriented, good eye contact, no obvious depression anxiety, cognition and judgment appear normal. LN: no cervical axillary  adenopathy  Lab Results  Component Value Date   WBC 9.6 06/21/2021   HGB 12.1 06/21/2021   HCT 37.6 06/21/2021   PLT 358.0 06/21/2021   GLUCOSE 83 06/21/2021   CHOL 166 03/15/2021   TRIG 57 03/15/2021   HDL 63 03/15/2021   LDLCALC 92 03/15/2021   ALT 13 06/21/2021   AST 17 06/21/2021   NA 136 06/21/2021   K 4.3 06/21/2021   CL 101 06/21/2021   CREATININE 0.67 06/21/2021   BUN 11 06/21/2021   CO2 31 06/21/2021   TSH 0.89 08/26/2019   INR 1.47 05/26/2009   HGBA1C 5.2 03/15/2021    BP Readings from Last 3 Encounters:  12/05/21 120/78  12/05/21 93/62  08/10/21 103/70    Lab reviewed with patient   ASSESSMENT AND PLAN:  Discussed the following assessment and plan:    ICD-10-CM   1. Visit for preventive health examination  Z00.00     2. Crohn's disease with complication, unspecified gastrointestinal tract location ([place])  K50.919     3. High risk medication use  Z79.899     4. Morbid obesity ([place])  E66.01     Asks about  future covid booster   uncertain     no studies   but if does make sure 6 mos from last and mid infusion time or as per  medical team  Return in about 1 year (around 12/06/2022).  Patient Care Team: Burnis Medin, MD as PCP - General Milus Banister, [name], [name], [name], DO as Consulting Physician [facility]) Patient Instructions  Good to see  you today  and corey...  Agree  with getting back to weight management .  ask  see if  weight management can  order a lipid panel when you get blood work with them.  And share results.   Heart and lung exam is normal . Let us know if recurrent pains or  concerns  . Could be chest wall pain ?         [name]. Caidon Foti M.D.

## 2021-12-05 NOTE — Patient Instructions (Addendum)
Good to see  you today  and [name]...  Agree  with getting back to weight management .  ask  see if  weight management can  order a lipid panel when you get blood work with them.  And share results.   Heart and lung exam is normal . Let us know if recurrent pains or concerns  . Could be chest wall pain ?

## 2021-12-06 ENCOUNTER — Encounter (INDEPENDENT_AMBULATORY_CARE_PROVIDER_SITE_OTHER): Payer: Self-pay | Admitting: Bariatrics

## 2021-12-06 NOTE — Progress Notes (Signed)
Chief Complaint:   OBESITY [name] is here to discuss her progress with her obesity treatment plan along with follow-up of her obesity related diagnoses. [name] is on keeping a food journal and adhering to recommended goals of 1300 calories and 90 grams of protein daily and states she is following her eating plan approximately 50% of the time. [name] states she is doing 0 minutes 0 times per week.  Today's visit was #: 23 Starting weight: 293 lbs Starting date: 10/02/2017 Today's weight: 249 lbs Today's date: 12/05/2021 Total lbs lost to date: 44 Total lbs lost since last in-office visit: 0  Interim History: [name] is up 15 pounds since her last visit.  She has been seeing Abby Potash, PA-C, but she has not been here.   Subjective:   1. Pre-diabetes [name] is on metformin.  2. Sleep deficient Myrta is sleep deficient due to a new baby.  Assessment/Plan:   1. Pre-diabetes [name] will continue metformin 500 mg BID, and we will refill for 1 month.   - metFORMIN (GLUCOPHAGE) 500 MG tablet; Take 1 tablet (500 mg total) by mouth 2 (two) times daily with a meal.  Dispense: 60 tablet; Refill: 0  2. Sleep deficient Sleep hygiene was discussed with Lennan today.   3. Obesity, current BMI 44.1 [name] is currently in the action stage of change. As such, her goal is to continue with weight loss efforts. She has agreed to keeping a food journal and adhering to recommended goals of 1300 calories and 90 grams of protein daily.   Increase water.  She will begin to track and use her app.  Decrease carbohydrates.  Exercise goals: No exercise has been prescribed at this time.  Behavioral modification strategies: increasing lean protein intake, decreasing simple carbohydrates, increasing vegetables, increasing water intake, decreasing eating out, no skipping meals, meal planning and cooking strategies, keeping healthy foods in the home, and planning for success.  [name] has agreed to  follow-up with our clinic in 4 weeks with myself or with Mina Marble, NP. She was informed of the importance of frequent follow-up visits to maximize her success with intensive lifestyle modifications for her multiple health conditions.   Objective:   Blood pressure 93/62, pulse 84, temperature 98 F (36.7 C), height 5' 3"  (1.6 m), weight 249 lb (112.9 kg), last menstrual period 11/08/2021, SpO2 93 %, not currently breastfeeding. Body mass index is 44.11 kg/m.  General: Cooperative, alert, well developed, in no acute distress. HEENT: Conjunctivae and lids unremarkable. Cardiovascular: Regular rhythm.  Lungs: Normal work of breathing. Neurologic: No focal deficits.   Lab Results  Component Value Date   CREATININE 0.67 06/21/2021   BUN 11 06/21/2021   NA 136 06/21/2021   K 4.3 06/21/2021   CL 101 06/21/2021   CO2 31 06/21/2021   Lab Results  Component Value Date   ALT 13 06/21/2021   AST 17 06/21/2021   ALKPHOS 76 06/21/2021   BILITOT 0.3 06/21/2021   Lab Results  Component Value Date   HGBA1C 5.2 03/15/2021   HGBA1C 5.1 10/31/2020   HGBA1C 5.5 07/06/2020   HGBA1C 5.3 01/13/2020   HGBA1C 5.4 07/14/2019   Lab Results  Component Value Date   INSULIN 4.4 03/15/2021   INSULIN 4.2 10/31/2020   INSULIN 3.0 07/06/2020   INSULIN 5.2 01/13/2020   INSULIN 4.5 07/14/2019   Lab Results  Component Value Date   TSH 0.89 08/26/2019   Lab Results  Component Value Date   CHOL 166  03/15/2021   HDL 63 03/15/2021   LDLCALC 92 03/15/2021   TRIG 57 03/15/2021   CHOLHDL 2.6 03/15/2021   Lab Results  Component Value Date   VD25OH 46.2 03/15/2021   VD25OH 53.9 10/31/2020   VD25OH 55.7 07/06/2020   Lab Results  Component Value Date   WBC 9.6 06/21/2021   HGB 12.1 06/21/2021   HCT 37.6 06/21/2021   MCV 88.5 06/21/2021   PLT 358.0 06/21/2021   Lab Results  Component Value Date   IRON 114 07/29/2013   FERRITIN 14.8 07/29/2013   Attestation Statements:   Reviewed by  clinician on day of visit: allergies, medications, problem list, medical history, surgical history, family history, social history, and previous encounter notes.   Wilhemena Durie, am acting as Location manager for [employer], DO.  I have reviewed the above documentation for accuracy and completeness, and I agree with the above. Jearld Lesch, DO

## 2021-12-26 ENCOUNTER — Ambulatory Visit (INDEPENDENT_AMBULATORY_CARE_PROVIDER_SITE_OTHER): Payer: BC Managed Care – PPO | Admitting: Adult Health

## 2021-12-26 ENCOUNTER — Encounter (INDEPENDENT_AMBULATORY_CARE_PROVIDER_SITE_OTHER): Payer: Self-pay | Admitting: Adult Health

## 2021-12-26 VITALS — BP 100/67 | HR 77 | Temp 97.8°F | Ht 63.0 in | Wt 250.0 lb

## 2021-12-26 DIAGNOSIS — E559 Vitamin D deficiency, unspecified: Secondary | ICD-10-CM

## 2021-12-26 DIAGNOSIS — Z9189 Other specified personal risk factors, not elsewhere classified: Secondary | ICD-10-CM

## 2021-12-26 DIAGNOSIS — Z6841 Body Mass Index (BMI) 40.0 and over, adult: Secondary | ICD-10-CM | POA: Diagnosis not present

## 2021-12-26 DIAGNOSIS — E669 Obesity, unspecified: Secondary | ICD-10-CM

## 2021-12-26 DIAGNOSIS — R7303 Prediabetes: Secondary | ICD-10-CM | POA: Diagnosis not present

## 2021-12-26 MED ORDER — METFORMIN HCL 500 MG PO TABS: 500.0000 mg | ORAL_TABLET | Freq: Two times a day (BID) | ORAL | 0 refills | Status: DC

## 2021-12-27 LAB — COMPREHENSIVE METABOLIC PANEL
ALT: 12 IU/L (ref 0–32)
AST: 17 IU/L (ref 0–40)
Albumin/Globulin Ratio: 1 — ABNORMAL LOW (ref 1.2–2.2)
Albumin: 3.8 g/dL — ABNORMAL LOW (ref 3.9–4.9)
Alkaline Phosphatase: 86 IU/L (ref 44–121)
BUN/Creatinine Ratio: 21 (ref 9–23)
BUN: 16 mg/dL (ref 6–20)
Bilirubin Total: 0.3 mg/dL (ref 0.0–1.2)
CO2: 22 mmol/L (ref 20–29)
Calcium: 9.2 mg/dL (ref 8.7–10.2)
Chloride: 102 mmol/L (ref 96–106)
Creatinine, Ser: 0.76 mg/dL (ref 0.57–1.00)
Globulin, Total: 3.8 g/dL (ref 1.5–4.5)
Glucose: 75 mg/dL (ref 70–99)
Potassium: 4.4 mmol/L (ref 3.5–5.2)
Sodium: 140 mmol/L (ref 134–144)
Total Protein: 7.6 g/dL (ref 6.0–8.5)
eGFR: 103 mL/min/{1.73_m2} (ref >59)

## 2021-12-27 LAB — VITAMIN B12: Vitamin B-12: 833 pg/mL (ref 232–1245)

## 2021-12-27 LAB — VITAMIN D 25 HYDROXY (VIT D DEFICIENCY, FRACTURES): Vit D, 25-Hydroxy: 44.3 ng/mL (ref 30.0–100.0)

## 2021-12-27 LAB — HEMOGLOBIN A1C
Est. average glucose Bld gHb Est-mCnc: 120 mg/dL
Hgb A1c MFr Bld: 5.8 % — ABNORMAL HIGH (ref 4.8–5.6)

## 2021-12-27 LAB — INSULIN, RANDOM: INSULIN: 5.1 u[IU]/mL (ref 2.6–24.9)

## 2021-12-28 NOTE — Progress Notes (Signed)
Chief Complaint:   OBESITY [name] is here to discuss her progress with her obesity treatment plan along with follow-up of her obesity related diagnoses. [name] is on keeping a food journal and adhering to recommended goals of 1300 calories and 90 gms protein and states she is following her eating plan approximately 100% of the time. [name] states she is not currently exercising.  Today's visit was #: 75 Starting weight: 293 lbs Starting date: 10/02/2017 Today's weight: 250 lbs Today's date: 12/26/21 Total lbs lost to date: 43 Total lbs lost since last in-office visit: +1  Interim History:  Tracking intake 100% with average calorie intake to 2121/day and protein 112 g/day.   Extra calories from carbohydrates.  Started [facility] 2018 through 2021 -lost 80 pounds. 2021 pregnancy -restarted [facility] July 2022 after delivering.  She is still adjusting to motherhood and returning to work. She is not breast-feeding.  Subjective:   1. Pre-diabetes The last week she has been taking metformin 500 mg twice daily with breakfast and dinner. She denies GI upset.  2. Vitamin [name] deficiency She is on OTC vitamin D3 2000 IU daily.  3. At risk for complication associated with hypotension Related to steady weight loss.   Assessment/Plan:   1. Pre-diabetes Refill - metFORMIN (GLUCOPHAGE) 500 MG tablet; Take 1 tablet (500 mg total) by mouth 2 (two) times daily with a meal.  Dispense: 60 tablet; Refill: 0 Check labs - Comprehensive metabolic panel - Hemoglobin A1c - Insulin, random - Vitamin B12  2. Vitamin [name] deficiency Check labs - VITAMIN [name] 25 Hydroxy (Vit-[name] Deficiency, Fractures)  3. At risk for complication associated with hypotension [name] was given approximately 15 minutes of education and counseling today to help avoid hypotension. We discussed risks of hypotension with weight loss and signs of hypotension such as feeling lightheaded or  unsteady.  Repetitive spaced learning was employed today to elicit superior memory formation and behavioral change.   4. Obesity, current BMI 44.3 [name] is currently in the action stage of change. As such, her goal is to continue with weight loss efforts. She has agreed to keeping a food journal and adhering to recommended goals of 1300 calories and 90 gms protein.   Exercise goals: All adults should avoid inactivity. Some physical activity is better than none, and adults who participate in any amount of physical activity gain some health benefits.  Behavioral modification strategies: increasing lean protein intake, decreasing simple carbohydrates, meal planning and cooking strategies, keeping healthy foods in the home, and planning for success.  [name] has agreed to follow-up with our clinic in 4 weeks. She was informed of the importance of frequent follow-up visits to maximize her success with intensive lifestyle modifications for her multiple health conditions.   [name] was informed we would discuss her lab results at her next visit unless there is a critical issue that needs to be addressed sooner. [name] agreed to keep her next visit at the agreed upon time to discuss these results.  Objective:   Blood pressure 100/67, pulse 77, temperature 97.8 F (36.6 C), height 5' 3"  (1.6 m), weight 250 lb (113.4 kg), last menstrual period 11/08/2021, SpO2 100 %, not currently breastfeeding. Body mass index is 44.29 kg/m.  General: Cooperative, alert, well developed, in no acute distress. HEENT: Conjunctivae and lids unremarkable. Cardiovascular: Regular rhythm.  Lungs: Normal work of breathing. Neurologic: No focal deficits.   Lab Results  Component Value Date   CREATININE 0.76  12/26/2021   BUN 16 12/26/2021   NA 140 12/26/2021   K 4.4 12/26/2021   CL 102 12/26/2021   CO2 22 12/26/2021   Lab Results  Component Value Date   ALT 12 12/26/2021   AST 17 12/26/2021   ALKPHOS 86  12/26/2021   BILITOT 0.3 12/26/2021   Lab Results  Component Value Date   HGBA1C 5.8 (H) 12/26/2021   HGBA1C 5.2 03/15/2021   HGBA1C 5.1 10/31/2020   HGBA1C 5.5 07/06/2020   HGBA1C 5.3 01/13/2020   Lab Results  Component Value Date   INSULIN 5.1 12/26/2021   INSULIN 4.4 03/15/2021   INSULIN 4.2 10/31/2020   INSULIN 3.0 07/06/2020   INSULIN 5.2 01/13/2020   Lab Results  Component Value Date   TSH 0.89 08/26/2019   Lab Results  Component Value Date   CHOL 166 03/15/2021   HDL 63 03/15/2021   LDLCALC 92 03/15/2021   TRIG 57 03/15/2021   CHOLHDL 2.6 03/15/2021   Lab Results  Component Value Date   VD25OH 44.3 12/26/2021   VD25OH 46.2 03/15/2021   VD25OH 53.9 10/31/2020   Lab Results  Component Value Date   WBC 9.6 06/21/2021   HGB 12.1 06/21/2021   HCT 37.6 06/21/2021   MCV 88.5 06/21/2021   PLT 358.0 06/21/2021   Lab Results  Component Value Date   IRON 114 07/29/2013   FERRITIN 14.8 07/29/2013   Attestation Statements:   Reviewed by clinician on day of visit: allergies, medications, problem list, medical history, surgical history, family history, social history, and previous encounter notes.  I, Donna Fick, FNP, am acting as Location manager for Donna Marble, NP.  I have reviewed the above documentation for accuracy and completeness, and I agree with the above. -  [name]. Donna Tamas, NP-C

## 2022-01-03 ENCOUNTER — Encounter (INDEPENDENT_AMBULATORY_CARE_PROVIDER_SITE_OTHER): Payer: Self-pay

## 2022-01-09 ENCOUNTER — Other Ambulatory Visit (INDEPENDENT_AMBULATORY_CARE_PROVIDER_SITE_OTHER): Payer: Self-pay | Admitting: Bariatrics

## 2022-01-09 DIAGNOSIS — R7303 Prediabetes: Secondary | ICD-10-CM

## 2022-01-24 ENCOUNTER — Emergency Department (HOSPITAL_COMMUNITY): Payer: BC Managed Care – PPO

## 2022-01-24 ENCOUNTER — Inpatient Hospital Stay (HOSPITAL_COMMUNITY)
Admission: EM | Admit: 2022-01-24 | Discharge: 2022-01-29 | DRG: 386 | Disposition: A | Discharge status: Home / Self Care | Payer: BC Managed Care – PPO | Attending: Internal Medicine | Admitting: Internal Medicine

## 2022-01-24 ENCOUNTER — Telehealth: Payer: Self-pay | Admitting: Gastroenterology

## 2022-01-24 ENCOUNTER — Other Ambulatory Visit: Payer: Self-pay

## 2022-01-24 ENCOUNTER — Encounter (HOSPITAL_COMMUNITY): Payer: Self-pay

## 2022-01-24 DIAGNOSIS — Z820 Family history of epilepsy and other diseases of the nervous system: Secondary | ICD-10-CM

## 2022-01-24 DIAGNOSIS — Z800 Family history of malignant neoplasm of digestive organs: Secondary | ICD-10-CM

## 2022-01-24 DIAGNOSIS — Z8249 Family history of ischemic heart disease and other diseases of the circulatory system: Secondary | ICD-10-CM

## 2022-01-24 DIAGNOSIS — M7989 Other specified soft tissue disorders: Secondary | ICD-10-CM | POA: Diagnosis not present

## 2022-01-24 DIAGNOSIS — Z9049 Acquired absence of other specified parts of digestive tract: Secondary | ICD-10-CM | POA: Diagnosis not present

## 2022-01-24 DIAGNOSIS — K633 Ulcer of intestine: Secondary | ICD-10-CM | POA: Diagnosis present

## 2022-01-24 DIAGNOSIS — K3189 Other diseases of stomach and duodenum: Secondary | ICD-10-CM | POA: Diagnosis not present

## 2022-01-24 DIAGNOSIS — X501XXA Overexertion from prolonged static or awkward postures, initial encounter: Secondary | ICD-10-CM

## 2022-01-24 DIAGNOSIS — K50811 Crohn's disease of both small and large intestine with rectal bleeding: Secondary | ICD-10-CM | POA: Diagnosis present

## 2022-01-24 DIAGNOSIS — E559 Vitamin D deficiency, unspecified: Secondary | ICD-10-CM | POA: Diagnosis present

## 2022-01-24 DIAGNOSIS — I82611 Acute embolism and thrombosis of superficial veins of right upper extremity: Secondary | ICD-10-CM | POA: Diagnosis not present

## 2022-01-24 DIAGNOSIS — Z6841 Body Mass Index (BMI) 40.0 and over, adult: Secondary | ICD-10-CM | POA: Diagnosis not present

## 2022-01-24 DIAGNOSIS — K922 Gastrointestinal hemorrhage, unspecified: Secondary | ICD-10-CM

## 2022-01-24 DIAGNOSIS — Z818 Family history of other mental and behavioral disorders: Secondary | ICD-10-CM

## 2022-01-24 DIAGNOSIS — S0003XA Contusion of scalp, initial encounter: Secondary | ICD-10-CM | POA: Diagnosis present

## 2022-01-24 DIAGNOSIS — Z7984 Long term (current) use of oral hypoglycemic drugs: Secondary | ICD-10-CM

## 2022-01-24 DIAGNOSIS — W19XXXA Unspecified fall, initial encounter: Secondary | ICD-10-CM | POA: Diagnosis present

## 2022-01-24 DIAGNOSIS — Z20822 Contact with and (suspected) exposure to covid-19: Secondary | ICD-10-CM | POA: Diagnosis present

## 2022-01-24 DIAGNOSIS — R7989 Other specified abnormal findings of blood chemistry: Secondary | ICD-10-CM | POA: Diagnosis present

## 2022-01-24 DIAGNOSIS — Y929 Unspecified place or not applicable: Secondary | ICD-10-CM

## 2022-01-24 DIAGNOSIS — K259 Gastric ulcer, unspecified as acute or chronic, without hemorrhage or perforation: Secondary | ICD-10-CM | POA: Diagnosis present

## 2022-01-24 DIAGNOSIS — S83419A Sprain of medial collateral ligament of unspecified knee, initial encounter: Secondary | ICD-10-CM | POA: Diagnosis present

## 2022-01-24 DIAGNOSIS — Z79899 Other long term (current) drug therapy: Secondary | ICD-10-CM

## 2022-01-24 DIAGNOSIS — D649 Anemia, unspecified: Secondary | ICD-10-CM | POA: Diagnosis not present

## 2022-01-24 DIAGNOSIS — M1711 Unilateral primary osteoarthritis, right knee: Secondary | ICD-10-CM | POA: Diagnosis present

## 2022-01-24 DIAGNOSIS — K921 Melena: Secondary | ICD-10-CM | POA: Diagnosis not present

## 2022-01-24 DIAGNOSIS — Z833 Family history of diabetes mellitus: Secondary | ICD-10-CM

## 2022-01-24 DIAGNOSIS — K648 Other hemorrhoids: Secondary | ICD-10-CM | POA: Diagnosis present

## 2022-01-24 DIAGNOSIS — Z8719 Personal history of other diseases of the digestive system: Secondary | ICD-10-CM

## 2022-01-24 DIAGNOSIS — D62 Acute posthemorrhagic anemia: Secondary | ICD-10-CM | POA: Diagnosis present

## 2022-01-24 DIAGNOSIS — Y990 Civilian activity done for income or pay: Secondary | ICD-10-CM | POA: Diagnosis not present

## 2022-01-24 DIAGNOSIS — K625 Hemorrhage of anus and rectum: Secondary | ICD-10-CM | POA: Diagnosis not present

## 2022-01-24 DIAGNOSIS — L538 Other specified erythematous conditions: Secondary | ICD-10-CM | POA: Diagnosis not present

## 2022-01-24 DIAGNOSIS — M25461 Effusion, right knee: Secondary | ICD-10-CM | POA: Diagnosis present

## 2022-01-24 DIAGNOSIS — R55 Syncope and collapse: Secondary | ICD-10-CM

## 2022-01-24 DIAGNOSIS — R944 Abnormal results of kidney function studies: Secondary | ICD-10-CM | POA: Diagnosis present

## 2022-01-24 DIAGNOSIS — D72829 Elevated white blood cell count, unspecified: Secondary | ICD-10-CM

## 2022-01-24 LAB — CBC
HCT: 23.5 % — ABNORMAL LOW (ref 36.0–46.0)
Hemoglobin: 7.2 g/dL — ABNORMAL LOW (ref 12.0–15.0)
MCH: 24.2 pg — ABNORMAL LOW (ref 26.0–34.0)
MCHC: 30.6 g/dL (ref 30.0–36.0)
MCV: 78.9 fL — ABNORMAL LOW (ref 80.0–100.0)
Platelets: 344 10*3/uL (ref 150–400)
RBC: 2.98 MIL/uL — ABNORMAL LOW (ref 3.87–5.11)
RDW: 17.5 % — ABNORMAL HIGH (ref 11.5–15.5)
WBC: 20.5 10*3/uL — ABNORMAL HIGH (ref 4.0–10.5)
nRBC: 0.1 % (ref 0.0–0.2)

## 2022-01-24 LAB — RAPID URINE DRUG SCREEN, HOSP PERFORMED
Amphetamines: NOT DETECTED
Barbiturates: NOT DETECTED
Benzodiazepines: NOT DETECTED
Cocaine: NOT DETECTED
Opiates: NOT DETECTED
Tetrahydrocannabinol: NOT DETECTED

## 2022-01-24 LAB — CBG MONITORING, ED: Glucose-Capillary: 125 mg/dL — ABNORMAL HIGH (ref 70–99)

## 2022-01-24 LAB — URINALYSIS, ROUTINE W REFLEX MICROSCOPIC
Bilirubin Urine: NEGATIVE
Glucose, UA: NEGATIVE mg/dL
Hgb urine dipstick: NEGATIVE
Ketones, ur: NEGATIVE mg/dL
Leukocytes,Ua: NEGATIVE
Nitrite: NEGATIVE
Protein, ur: NEGATIVE mg/dL
Specific Gravity, Urine: 1.014 (ref 1.005–1.030)
pH: 5 (ref 5.0–8.0)

## 2022-01-24 LAB — BASIC METABOLIC PANEL
Anion gap: 6 (ref 5–15)
BUN: 39 mg/dL — ABNORMAL HIGH (ref 6–20)
CO2: 22 mmol/L (ref 22–32)
Calcium: 7.6 mg/dL — ABNORMAL LOW (ref 8.9–10.3)
Chloride: 106 mmol/L (ref 98–111)
Creatinine, Ser: 0.56 mg/dL (ref 0.44–1.00)
GFR, Estimated: >60 mL/min (ref >60)
Glucose, Bld: 132 mg/dL — ABNORMAL HIGH (ref 70–99)
Potassium: 3.7 mmol/L (ref 3.5–5.1)
Sodium: 134 mmol/L — ABNORMAL LOW (ref 135–145)

## 2022-01-24 LAB — PREPARE RBC (CROSSMATCH)

## 2022-01-24 LAB — CK: Total CK: 267 U/L — ABNORMAL HIGH (ref 38–234)

## 2022-01-24 LAB — D-DIMER, QUANTITATIVE: D-Dimer, Quant: 0.95 ug/mL-FEU — ABNORMAL HIGH (ref 0.00–0.50)

## 2022-01-24 LAB — SARS CORONAVIRUS 2 BY RT PCR: SARS Coronavirus 2 by RT PCR: NEGATIVE

## 2022-01-24 LAB — MAGNESIUM: Magnesium: 1.8 mg/dL (ref 1.7–2.4)

## 2022-01-24 LAB — TROPONIN I (HIGH SENSITIVITY): Troponin I (High Sensitivity): 2 ng/L (ref <18)

## 2022-01-24 LAB — HCG, QUANTITATIVE, PREGNANCY: hCG, Beta Chain, Quant, S: <1 m[IU]/mL (ref <5)

## 2022-01-24 LAB — POC OCCULT BLOOD, ED: Fecal Occult Bld: POSITIVE — AB

## 2022-01-24 MED ORDER — ONDANSETRON HCL 4 MG/2ML IJ SOLN: 4.0000 mg | Freq: Four times a day (QID) | INTRAMUSCULAR | Status: DC | PRN

## 2022-01-24 MED ORDER — ONDANSETRON HCL 4 MG PO TABS: 4.0000 mg | ORAL_TABLET | Freq: Four times a day (QID) | ORAL | Status: DC | PRN

## 2022-01-24 MED ORDER — PANTOPRAZOLE 80MG IVPB - SIMPLE MED
80.0000 mg | Freq: Once | INTRAVENOUS | Status: AC
  Administered 2022-01-24: 80 mg via INTRAVENOUS
  Filled 2022-01-24: qty 80

## 2022-01-24 MED ORDER — OYSTER SHELL CALCIUM/D3 500-5 MG-MCG PO TABS
1.0000 | ORAL_TABLET | Freq: Every day | ORAL | Status: DC
Start: 2022-01-25 — End: 2022-01-29
  Administered 2022-01-26 – 2022-01-29 (×4): 1 via ORAL
  Filled 2022-01-24 (×5): qty 1

## 2022-01-24 MED ORDER — PANTOPRAZOLE INFUSION (NEW) - SIMPLE MED
8.0000 mg/h | INTRAVENOUS | Status: AC
  Administered 2022-01-24 – 2022-01-27 (×6): 8 mg/h via INTRAVENOUS
  Filled 2022-01-24: qty 100
  Filled 2022-01-24 (×3): qty 80
  Filled 2022-01-24: qty 100
  Filled 2022-01-24: qty 80
  Filled 2022-01-24 (×2): qty 100
  Filled 2022-01-24: qty 80

## 2022-01-24 MED ORDER — SODIUM CHLORIDE 0.9 % IV SOLN: 10.0000 mL/h | Freq: Once | INTRAVENOUS | Status: DC

## 2022-01-24 MED ORDER — FLUTICASONE PROPIONATE 50 MCG/ACT NA SUSP: 1.0000 | Freq: Every day | NASAL | Status: DC | PRN

## 2022-01-24 MED ORDER — ACETAMINOPHEN 650 MG RE SUPP
650.0000 mg | Freq: Four times a day (QID) | RECTAL | Status: DC | PRN
Start: 2022-01-24 — End: 2022-01-25

## 2022-01-24 MED ORDER — SODIUM CHLORIDE (PF) 0.9 % IJ SOLN
INTRAMUSCULAR | Status: AC
  Filled 2022-01-24: qty 50

## 2022-01-24 MED ORDER — LORATADINE 10 MG PO TABS
10.0000 mg | ORAL_TABLET | Freq: Every day | ORAL | Status: DC
  Administered 2022-01-26 – 2022-01-29 (×4): 10 mg via ORAL
  Filled 2022-01-24 (×4): qty 1

## 2022-01-24 MED ORDER — ACETAMINOPHEN 325 MG PO TABS
650.0000 mg | ORAL_TABLET | Freq: Four times a day (QID) | ORAL | Status: DC | PRN
  Administered 2022-01-25: 650 mg via ORAL
  Filled 2022-01-24: qty 2

## 2022-01-24 MED ORDER — IOHEXOL 350 MG/ML SOLN
100.0000 mL | Freq: Once | INTRAVENOUS | Status: AC | PRN
  Administered 2022-01-24: 100 mL via INTRAVENOUS

## 2022-01-24 MED ORDER — LACTATED RINGERS IV BOLUS
1000.0000 mL | Freq: Once | INTRAVENOUS | Status: AC
  Administered 2022-01-24: 1000 mL via INTRAVENOUS

## 2022-01-24 NOTE — ED Notes (Signed)
Pt reports feeling like she unable to pee at this time.

## 2022-01-24 NOTE — ED Provider Notes (Signed)
[facility] DEPT Provider Note   CSN: [id number] Arrival date & time: 01/24/22  1158     History  Chief Complaint  Patient presents with   Loss of Consciousness    [name] is a 41 y.o. female.  41 year old female who presents to the emergency department with shortness of breath and syncope.  Patient states that she was at work today and was in a [employer].  That she felt very warm and went to open a window then passed out.  Says that afterwards she has been feeling very short of breath but does not have any chest pain.  Denies any cough recently.  Says that she did lose consciousness and believes she had a head strike.  She is not on blood thinners.  No history of DVT/PE.  Was recently treated for sinusitis with antibiotics but says that that is improved.  Did have some mild diarrhea with that but no melena or hematochezia.  No heavy vaginal bleeding recently.  Does have a history of anemia in the past but is unsure why.    Near Syncope       Home Medications Prior to Admission medications   Medication Sig Start Date End Date Taking? Authorizing Provider  Calcium Carb-Cholecalciferol (CALCIUM/VITAMIN D) 600-400 MG-UNIT TABS Take 1 tablet by mouth daily.   Yes [provider]  fexofenadine (ALLEGRA) 180 MG tablet Take 180 mg by mouth daily.   Yes [provider]  fluticasone (FLONASE) 50 MCG/ACT nasal spray Place 1 spray into both nostrils daily as needed for allergies.   Yes [provider]  metFORMIN (GLUCOPHAGE) 500 MG tablet Take 1 tablet (500 mg total) by mouth 2 (two) times daily with a meal. 12/26/21  Yes Danford, Katy D, NP  naproxen (NAPROSYN) 500 MG tablet Take 500 mg by mouth 2 (two) times daily. 01/11/22  Yes [provider]  nystatin-triamcinolone ointment (MYCOLOG) Apply 1 Application topically 2 (two) times daily as needed (rash). 06/12/21  Yes [provider]  Prenatal Vit-Fe  Fumarate-FA (PRENATAL MULTIVITAMIN) TABS tablet Take 1 tablet by mouth daily at 12 noon.   Yes [provider]  Vitamin D, Cholecalciferol, 50 MCG (2000 UT) CAPS Take 2,000 Units by mouth daily.   Yes [provider]  amoxicillin-clavulanate (AUGMENTIN) 875-125 MG tablet Take 1 tablet by mouth 2 (two) times daily. 01/11/22   [provider]      Allergies    Patient has no known allergies.    Review of Systems   Review of Systems  Cardiovascular:  Positive for near-syncope.    Physical Exam Updated Vital Signs BP (!) 127/56   Pulse 92   Temp 98.7 F (37.1 C) (Oral)   Resp (!) 31   LMP 01/10/2022   SpO2 100%  Physical Exam Vitals and nursing note reviewed.  Constitutional:      General: She is not in acute distress.    Appearance: She is well-developed.  HENT:     Head: Normocephalic and atraumatic.     Right Ear: External ear normal.     Left Ear: External ear normal.     Nose: Nose normal.  Eyes:     Extraocular Movements: Extraocular movements intact.     Conjunctiva/sclera: Conjunctivae normal.     Pupils: Pupils are equal, round, and reactive to light.  Cardiovascular:     Rate and Rhythm: Regular rhythm. Tachycardia present.     Heart sounds: No murmur heard. Pulmonary:  Effort: Pulmonary effort is normal. No respiratory distress.     Breath sounds: Normal breath sounds.  Abdominal:     General: Abdomen is flat. There is no distension.     Palpations: Abdomen is soft. There is no mass.     Tenderness: There is no abdominal tenderness. There is no guarding.  Genitourinary:    Comments: Chaperoned by patient's RN. No external hemorrhoids or fissures noted on external inspection. No internal masses or hemorroids noted. Normal rectal tone. Melena noted on rectal exam. Hemoccult positive.   Musculoskeletal:        General: No swelling.     Cervical back: Normal range of motion and neck supple.     Right lower leg: No edema.     Left  lower leg: No edema.  Skin:    General: Skin is warm and dry.     Capillary Refill: Capillary refill takes more than 3 seconds.  Neurological:     Mental Status: She is alert and oriented to person, place, and time. Mental status is at baseline.  Psychiatric:        Mood and Affect: Mood normal.     ED Results / Procedures / Treatments   Labs (all labs ordered are listed, but only abnormal results are displayed) Labs Reviewed  BASIC METABOLIC PANEL - Abnormal; Notable for the following components:      Result Value   Sodium 134 (*)    Glucose, Bld 132 (*)    BUN 39 (*)    Calcium 7.6 (*)    All other components within normal limits  CBC - Abnormal; Notable for the following components:   WBC 20.5 (*)    RBC 2.98 (*)    Hemoglobin 7.2 (*)    HCT 23.5 (*)    MCV 78.9 (*)    MCH 24.2 (*)    RDW 17.5 (*)    All other components within normal limits  URINALYSIS, ROUTINE W REFLEX MICROSCOPIC - Abnormal; Notable for the following components:   Color, Urine STRAW (*)    All other components within normal limits  D-DIMER, QUANTITATIVE - Abnormal; Notable for the following components:   D-Dimer, Quant 0.95 (*)    All other components within normal limits  CK - Abnormal; Notable for the following components:   Total CK 267 (*)    All other components within normal limits  CBG MONITORING, ED - Abnormal; Notable for the following components:   Glucose-Capillary 125 (*)    All other components within normal limits  POC OCCULT BLOOD, ED - Abnormal; Notable for the following components:   Fecal Occult Bld POSITIVE (*)    All other components within normal limits  SARS CORONAVIRUS 2 BY RT PCR  HCG, QUANTITATIVE, PREGNANCY  MAGNESIUM  RAPID URINE DRUG SCREEN, HOSP PERFORMED  HEMOGLOBIN AND HEMATOCRIT, BLOOD  HEMOGLOBIN AND HEMATOCRIT, BLOOD  IRON AND TIBC  ALBUMIN  PTH, INTACT AND CALCIUM  HIV ANTIBODY (ROUTINE TESTING W REFLEX)  COMPREHENSIVE METABOLIC PANEL  CBC  CBG  MONITORING, ED  TYPE AND SCREEN  PREPARE RBC (CROSSMATCH)  TROPONIN I (HIGH SENSITIVITY)    EKG EKG Interpretation  Date/Time:  Wednesday January 24 2022 12:24:05 EDT Ventricular Rate:  85 PR Interval:  159 QRS Duration: 86 QT Interval:  360 QTC Calculation: 428 R Axis:   65 Text Interpretation: Sinus rhythm Abnormal R-wave progression, early transition Confirmed by Margaretmary Eddy [phone]) on 01/24/2022 12:31:38 PM  Radiology CT Angio Chest PE W and/or  Wo Contrast  Result Date: 01/24/2022 CLINICAL DATA:  Syncope, positive D-dimer EXAM: CT ANGIOGRAPHY CHEST WITH CONTRAST TECHNIQUE: Multidetector CT imaging of the chest was performed using the standard protocol during bolus administration of intravenous contrast. Multiplanar CT image reconstructions and MIPs were obtained to evaluate the vascular anatomy. RADIATION DOSE REDUCTION: This exam was performed according to the departmental dose-optimization program which includes automated exposure control, adjustment of the mA and/or kV according to patient size and/or use of iterative reconstruction technique. CONTRAST:  133m OMNIPAQUE IOHEXOL 350 MG/ML SOLN COMPARISON:  Chest radiograph done earlier today FINDINGS: Cardiovascular: There is homogeneous enhancement in thoracic aorta. There are no intraluminal filling defects in central pulmonary artery branches. Evaluation of small peripheral branches he is limited by motion artifacts. Mediastinum/Nodes: No significant lymphadenopathy is seen. Lungs/Pleura: There is no focal pulmonary consolidation. There is no pleural effusion or pneumothorax. Upper Abdomen: Unremarkable. Musculoskeletal: Unremarkable. Review of the MIP images confirms the above findings. IMPRESSION: There is no evidence of central pulmonary artery embolism. There is no evidence of thoracic aortic dissection. There is no focal pulmonary consolidation. Electronically Signed   By: PElmer PickerM.D.   On: 01/24/2022 14:59   CT  Head Wo Contrast  Result Date: 01/24/2022 CLINICAL DATA:  Provided history: Headache, syncope. Additional history provided: Head trauma. EXAM: CT HEAD WITHOUT CONTRAST TECHNIQUE: Contiguous axial images were obtained from the base of the skull through the vertex without intravenous contrast. RADIATION DOSE REDUCTION: This exam was performed according to the departmental dose-optimization program which includes automated exposure control, adjustment of the mA and/or kV according to patient size and/or use of iterative reconstruction technique. COMPARISON:  None Available. FINDINGS: Brain: Cerebral volume is normal. There is no acute intracranial hemorrhage. No demarcated cortical infarct. No extra-axial fluid collection. No evidence of an intracranial mass. No midline shift. Vascular: No hyperdense vessel. Skull: No fracture or aggressive osseous lesion. Sinuses/Orbits: No mass or acute finding within the imaged orbits. No significant paranasal sinus disease. Other: Right parietal scalp hematoma. IMPRESSION: No evidence of acute intracranial abnormality. Right parietal scalp hematoma. Electronically Signed   By: KKellie SimmeringD.O.   On: 01/24/2022 14:59   DG Chest 1 View  Result Date: 01/24/2022 CLINICAL DATA:  Syncope EXAM: CHEST  1 VIEW COMPARISON:  Chest radiograph 11/09/2010 FINDINGS: The cardiomediastinal silhouette is normal. Lung volumes are significantly diminished. There is no focal consolidation or pulmonary edema. There is no pleural effusion or pneumothorax There is no acute osseous abnormality. IMPRESSION: Low lung volumes. Otherwise, no radiographic evidence of acute cardiopulmonary process. Electronically Signed   By: PValetta MoleM.D.   On: 01/24/2022 12:57    Procedures Procedures   Medications Ordered in ED Medications  sodium chloride (PF) 0.9 % injection (has no administration in time range)  pantoprozole (PROTONIX) 80 mg /NS 100 mL infusion (8 mg/hr Intravenous New Bag/Given 01/24/22  1609)  0.9 %  sodium chloride infusion (has no administration in time range)  calcium-vitamin D (OSCAL WITH D) 500-5 MG-MCG per tablet 1 tablet (has no administration in time range)  loratadine (CLARITIN) tablet 10 mg (has no administration in time range)  fluticasone (FLONASE) 50 MCG/ACT nasal spray 1 spray (has no administration in time range)  acetaminophen (TYLENOL) tablet 650 mg (has no administration in time range)    Or  acetaminophen (TYLENOL) suppository 650 mg (has no administration in time range)  ondansetron (ZOFRAN) tablet 4 mg (has no administration in time range)    Or  ondansetron (ZOFRAN)  injection 4 mg (has no administration in time range)  lactated ringers bolus 1,000 mL (0 mLs Intravenous Stopped 01/24/22 1609)  iohexol (OMNIPAQUE) 350 MG/ML injection 100 mL (100 mLs Intravenous Contrast Given 01/24/22 1433)  pantoprazole (PROTONIX) 80 mg /NS 100 mL IVPB (0 mg Intravenous Stopped 01/24/22 1609)    ED Course/ Medical Decision Making/ A&P Clinical Course as of 01/24/22 1924  Wed Jan 24, 2022  1746 Talked to Dr Lorenso Courier from GI who will see the patient in the morning.  Recommends keeping n.p.o. for now. [RP]    Clinical Course User Index [RP] Fransico Meadow, MD                           Medical Decision Making Amount and/or Complexity of Data Reviewed Labs: ordered. Radiology: ordered.  Risk Prescription drug management. Decision regarding hospitalization.   [name] is a 41 y.o. female with history of crohns disease who presents with chief complaint of syncope.  Initial Ddx:  Cardiogenic syncope, pulmonary embolism, heat related syncope, anemia, ICH  MDM:  When patient presented she had undifferentiated syncope but was hemodynamically stable.  Initially felt that the patient had heat related syncope and so she was given IV fluids.  Patient complaining of shortness of breath afterwards so pulmonary embolism and MI also being considered along with  symptomatic anemia.  Given the patient's loss of consciousness ICH is also on the differential though feel this is less likely.  With the patient's warm sensation could reflect a potential infectious source but does not have any focal sources at this time.  Plan:  Labs Troponin D-dimer EKG Fluids   ED Summary:  Patient underwent the above work-up which showed that the patient's hemoglobin had dropped significantly from 12 to 7.  Rectal exam was performed which did show melena that was Hemoccult positive.  Feel that the patient likely had a symptomatic anemia due to possible bleed from her Crohn's.  Did discuss risk factors for upper GI bleed including steroids, NSAIDs, and alcohol which patient denied to me.  D-dimer is elevated CTA did not show any evidence of pneumonia or pulmonary some.  CT head did not reveal any acute abnormalities.  EKG and troponins were unremarkable.  The patient was then admitted to medicine for further management.  GI was also consulted and patient was started on Protonix and ordered for 1 unit of blood for symptomatic anemia.  Dispo: Admit to Floor   Additional history obtained from significant other Records reviewed OP Notes The following labs were independently interpreted: Chemistry, Serial Troponins, D-dimer, Urinalysis, and CBC I independently visualized the following imaging with scope of interpretation limited to determining acute life threatening conditions related to emergency care: CT Head, which revealed no acute abnormality  Consults: Gastroenterology Social Determinants of health:  Na  Final Clinical Impression(s) / ED Diagnoses Final diagnoses:  Syncope and collapse  Gastrointestinal hemorrhage, unspecified gastrointestinal hemorrhage type  Symptomatic anemia    Rx / DC Orders ED Discharge Orders     None         Fransico Meadow, MD 01/24/22 1924

## 2022-01-24 NOTE — Telephone Encounter (Signed)
The pt states that she has spoken to the ED doc and he will consult GI for advise.  Nothing further needed at this time.

## 2022-01-24 NOTE — ED Triage Notes (Addendum)
Pt arrived via POV, syncope at work, hit head. Pt aox4 prior to arrival, in and out of consciousness in triage.  Per spouse, pt recently treated for sinus infection, finished abx. Was starting to feel better until this morning.

## 2022-01-24 NOTE — H&P (Signed)
History and Physical    Patient: [name] OMA:[id number] DOB: 14-May-1981 DOA: 01/24/2022 DOS: the patient was seen and examined on 01/24/2022 PCP: Burnis Medin, MD  Patient coming from: Home  Chief Complaint:  Chief Complaint  Patient presents with   Near syncope   HPI: [name] is a 41 y.o. female with medical history significant of Crohn's disease. Presenting with near syncopal episode. She reports that she's had a week of increasing fatigue. She reports other that having some dark stool yesterday, she didn't notice any other symptoms prior to this morning. This morning she felt odd. She was warm but didn't understand why. She was able to go to work. While she was at work, the warm sensation came on again. She felt like she couldn't breathe. After struggling with that feeling for a bit, she feel and hit her head on the table. She remembers the entire fall. She denies any chest pain, lightheadedness, or dizziness. She did feel nauseous prior to her fall. EMS was alerted and she was brought to the ED for evaluation.   Review of Systems: As mentioned in the history of present illness. All other systems reviewed and are negative. Past Medical History:  Diagnosis Date   Abrasion of upper arm with infection, left, sequela    started antibiotics lef arm oct 4th started antibiotics healing well   Anemia    hx of 2 yrs ago   Chicken pox    Chronic rhinitis    Contact dermatitis and other eczema, due to unspecified cause    Crohn's 11-15-11   no issues in 2 yrs   Fibroids 11-15-11   remains with fibroids   Hernia    Incisional hernia, RLQ 10/02/2011   Infertility, female    Mononucleosis    Obesity    Pilonidal abscess    Rectal abscess    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[address])    Vitamin D deficiency    Past Surgical History:  Procedure Laterality Date   APPENDECTOMY  7/06   CESAREAN SECTION N/A 11/30/2020   Procedure: CESAREAN SECTION;  Surgeon: Everett Graff, MD;  Location: [facility];  Service: Obstetrics;  Laterality: N/A;   COLONOSCOPY WITH PROPOFOL N/A 03/07/2017   Procedure: COLONOSCOPY WITH PROPOFOL;  Surgeon: Milus Banister, MD;  Location: [facility];  Service: Endoscopy;  Laterality: N/A;   EYE SURGERY  11-15-11   2003-multiple stys   ileocecal resection  1/11   crohns disease with fisula/stricture. Dr. Ronnald Collum   INCISIONAL HERNIA REPAIR N/A 12/24/2018   Procedure: OPEN REPAIR VENTRAL INCISIONAL HERNIA WITH MESH PATCH AND PRIMARY REPAIR OF LEFT SPIGELIAN HERNIA;  Surgeon: Armandina Gemma, MD;  Location: [facility];  Service: General;  Laterality: N/A;   KNEE ARTHROSCOPY  99, 02   Bil.   MYOMECTOMY N/A 11/10/2014   Procedure: MYOMECTOMY;  Surgeon: Everett Graff, MD;  Location: [facility];  Service: Gynecology;  Laterality: N/A;   PILONIDAL CYST EXCISION  2011, 2012   I&Ds   VENTRAL HERNIA REPAIR  11/27/2011   Procedure: LAPAROSCOPIC VENTRAL HERNIA;  Surgeon: Adin Hector, MD;  Location: [facility];  Service: General;  Laterality: N/A;  Laparoscopic Exploration & Repair of Hernia in Abdomen   WISDOM TOOTH EXTRACTION     Social History:  reports that she has never smoked. She has never used smokeless tobacco. She reports that she does not drink alcohol and does not use drugs.  No Known Allergies  Family History  Problem Relation Age of Onset   Aneurysm Mother        brain   Heart disease Mother 4       heart anyrism   Healthy Father        fairly   Diabetes Sister        half sister   Bipolar disorder Sister        half sister   Colon cancer Maternal Grandmother    Stomach cancer Paternal Grandfather    Multiple sclerosis Sister     Prior to Admission medications   Medication Sig Start Date End Date Taking? Authorizing Provider  Calcium Carb-Cholecalciferol (CALCIUM/VITAMIN D) 600-400 MG-UNIT TABS Take 1 tablet by mouth daily.   Yes [provider]  fexofenadine (ALLEGRA) 180 MG tablet Take 180 mg by mouth daily.    Yes [provider]  fluticasone (FLONASE) 50 MCG/ACT nasal spray Place 1 spray into both nostrils daily as needed for allergies.   Yes [provider]  metFORMIN (GLUCOPHAGE) 500 MG tablet Take 1 tablet (500 mg total) by mouth 2 (two) times daily with a meal. 12/26/21  Yes Danford, Katy D, NP  naproxen (NAPROSYN) 500 MG tablet Take 500 mg by mouth 2 (two) times daily. 01/11/22  Yes [provider]  nystatin-triamcinolone ointment (MYCOLOG) Apply 1 Application topically 2 (two) times daily as needed (rash). 06/12/21  Yes [provider]  Prenatal Vit-Fe Fumarate-FA (PRENATAL MULTIVITAMIN) TABS tablet Take 1 tablet by mouth daily at 12 noon.   Yes [provider]  Vitamin D, Cholecalciferol, 50 MCG (2000 UT) CAPS Take 2,000 Units by mouth daily.   Yes [provider]  amoxicillin-clavulanate (AUGMENTIN) 875-125 MG tablet Take 1 tablet by mouth 2 (two) times daily. 01/11/22   [provider]    Physical Exam: Vitals:   01/24/22 1345 01/24/22 1500 01/24/22 1515 01/24/22 1543  BP: (!) 119/58 135/63  114/78  Pulse: 84 99  85  Resp: (!) 29  17 16   Temp:      TempSrc:      SpO2: 100%  99% 98%   General: 41 y.o. female resting in bed in NAD Eyes: PERRL, normal sclera ENMT: Nares patent w/o discharge, orophaynx clear, dentition normal, ears w/o discharge/lesions/ulcers Neck: Supple, trachea midline Cardiovascular: RRR, +S1, S2, no m/g/r, equal pulses throughout Respiratory: CTABL, no w/r/r, normal WOB GI: BS+, NDNT, no masses noted, no organomegaly noted MSK: No e/c/c Neuro: A&O x 3, no focal deficits Psyc: Appropriate interaction and affect, calm/cooperative  Data Reviewed:  Lab Results  Component Value Date   NA 134 (L) 01/24/2022   K 3.7 01/24/2022   CO2 22 01/24/2022   GLUCOSE 132 (H) 01/24/2022   BUN 39 (H) 01/24/2022   CREATININE 0.56 01/24/2022   CALCIUM 7.6 (L) 01/24/2022   EGFR 103 12/26/2021   GFRNONAA >60  01/24/2022   Lab Results  Component Value Date   WBC 20.5 (H) 01/24/2022   HGB 7.2 (L) 01/24/2022   HCT 23.5 (L) 01/24/2022   MCV 78.9 (L) 01/24/2022   PLT 344 01/24/2022   CXR Low lung volumes. Otherwise, no radiographic evidence of acute cardiopulmonary process.  CTH No evidence of acute intracranial abnormality.  Right parietal scalp hematoma.  CTA Chest PE There is no evidence of central pulmonary artery embolism. There is no evidence of thoracic aortic dissection. There is no focal pulmonary consolidation.  Assessment and Plan: GIB Symptomatic anemia     - admit to inpt, tele     -  no hematemesis, but reports melena; BUN is elevated     - continue protonix     - check TIBC     - transfuse 1 unit pRBCs; check q6h H&H, transfuse as appropriate     - [facility] consulted (Dr. Lorenso Courier) appreciate assistance, will see in AM     - CLD for now, NPOpMN  Near syncope     - secondary to GIB?     - CTH as above     - check echo  Elevated D-dimer     - CTA chest as above     - check b/l dopplers  Hx of Crohn's     - not on any meds     - defer to GI  Hypocalcemia     - check albumin; replace Ca2+ after albumin results if appropriate     - check PTH, ionized Ca2+  Leukocytosis     - reactive?     - UA negative, CXR negative     - trend for now  Left ankle pain     - she thinks she rolled her left ankle during the fall     - no swelling and she has good range of motion in exam under own power     - monitor  Advance Care Planning:   Code Status: FULL  Consults: [facility]  Family Communication: w/ husband at bedside  Severity of Illness: The appropriate patient status for this patient is INPATIENT. Inpatient status is judged to be reasonable and necessary in order to provide the required intensity of service to ensure the patient's safety. The patient's presenting symptoms, physical exam findings, and initial radiographic and laboratory data in the context of their  chronic comorbidities is felt to place them at high risk for further clinical deterioration. Furthermore, it is not anticipated that the patient will be medically stable for discharge from the hospital within 2 midnights of admission.   * I certify that at the point of admission it is my clinical judgment that the patient will require inpatient hospital care spanning beyond 2 midnights from the point of admission due to high intensity of service, high risk for further deterioration and high frequency of surveillance required.*  Time spent in coordination of this H&P: 50 minutes  Author: Jonnie Finner, DO 01/24/2022 3:54 PM  For on call review [email or web address].

## 2022-01-24 NOTE — Telephone Encounter (Signed)
Inbound call from patient stating that she is currently at [facility] ED. Patient stated that she passed out at work and hit her head and the doctor over at the hospital did a stool culture and found blood in her stool. Patient stated that they think that it may be a flare up of   crohn's. Patient was asking If Dr. Ardis Hughs was in the office today and I advised her that he was not. Patient is wanting a second opinion on what the hospital is saying. Please advise.

## 2022-01-24 NOTE — ED Notes (Signed)
1431m urine returned in foley catheter.

## 2022-01-25 ENCOUNTER — Inpatient Hospital Stay (HOSPITAL_COMMUNITY): Payer: BC Managed Care – PPO

## 2022-01-25 ENCOUNTER — Inpatient Hospital Stay (HOSPITAL_COMMUNITY): Payer: BC Managed Care – PPO | Admitting: Anesthesiology

## 2022-01-25 ENCOUNTER — Encounter (HOSPITAL_COMMUNITY): Payer: Self-pay | Admitting: Internal Medicine

## 2022-01-25 ENCOUNTER — Encounter (HOSPITAL_COMMUNITY)
Admission: EM | Disposition: A | Discharge status: Home / Self Care | Payer: Self-pay | Source: Home / Self Care | Attending: Internal Medicine

## 2022-01-25 ENCOUNTER — Inpatient Hospital Stay (HOSPITAL_BASED_OUTPATIENT_CLINIC_OR_DEPARTMENT_OTHER): Payer: BC Managed Care – PPO

## 2022-01-25 ENCOUNTER — Ambulatory Visit (INDEPENDENT_AMBULATORY_CARE_PROVIDER_SITE_OTHER): Payer: BC Managed Care – PPO | Admitting: Adult Health

## 2022-01-25 DIAGNOSIS — R7989 Other specified abnormal findings of blood chemistry: Secondary | ICD-10-CM

## 2022-01-25 DIAGNOSIS — K3189 Other diseases of stomach and duodenum: Secondary | ICD-10-CM | POA: Diagnosis not present

## 2022-01-25 DIAGNOSIS — D649 Anemia, unspecified: Secondary | ICD-10-CM | POA: Diagnosis not present

## 2022-01-25 DIAGNOSIS — K921 Melena: Secondary | ICD-10-CM

## 2022-01-25 DIAGNOSIS — R55 Syncope and collapse: Secondary | ICD-10-CM

## 2022-01-25 HISTORY — PX: ESOPHAGOGASTRODUODENOSCOPY (EGD) WITH PROPOFOL: SHX5813

## 2022-01-25 HISTORY — PX: BIOPSY: SHX5522

## 2022-01-25 LAB — COMPREHENSIVE METABOLIC PANEL
ALT: 12 U/L (ref 0–44)
AST: 14 U/L — ABNORMAL LOW (ref 15–41)
Albumin: 2.3 g/dL — ABNORMAL LOW (ref 3.5–5.0)
Alkaline Phosphatase: 42 U/L (ref 38–126)
Anion gap: 5 (ref 5–15)
BUN: 25 mg/dL — ABNORMAL HIGH (ref 6–20)
CO2: 23 mmol/L (ref 22–32)
Calcium: 7.4 mg/dL — ABNORMAL LOW (ref 8.9–10.3)
Chloride: 108 mmol/L (ref 98–111)
Creatinine, Ser: 0.63 mg/dL (ref 0.44–1.00)
GFR, Estimated: >60 mL/min (ref >60)
Glucose, Bld: 97 mg/dL (ref 70–99)
Potassium: 3.8 mmol/L (ref 3.5–5.1)
Sodium: 136 mmol/L (ref 135–145)
Total Bilirubin: 0.4 mg/dL (ref 0.3–1.2)
Total Protein: 5.2 g/dL — ABNORMAL LOW (ref 6.5–8.1)

## 2022-01-25 LAB — CBC WITH DIFFERENTIAL/PLATELET
Abs Immature Granulocytes: 0.37 10*3/uL — ABNORMAL HIGH (ref 0.00–0.07)
Basophils Absolute: 0.1 10*3/uL (ref 0.0–0.1)
Basophils Relative: 0 %
Eosinophils Absolute: 0.1 10*3/uL (ref 0.0–0.5)
Eosinophils Relative: 1 %
HCT: 27 % — ABNORMAL LOW (ref 36.0–46.0)
Hemoglobin: 8.5 g/dL — ABNORMAL LOW (ref 12.0–15.0)
Immature Granulocytes: 2 %
Lymphocytes Relative: 24 %
Lymphs Abs: 4.2 10*3/uL — ABNORMAL HIGH (ref 0.7–4.0)
MCH: 26 pg (ref 26.0–34.0)
MCHC: 31.5 g/dL (ref 30.0–36.0)
MCV: 82.6 fL (ref 80.0–100.0)
Monocytes Absolute: 1.6 10*3/uL — ABNORMAL HIGH (ref 0.1–1.0)
Monocytes Relative: 9 %
Neutro Abs: 11.2 10*3/uL — ABNORMAL HIGH (ref 1.7–7.7)
Neutrophils Relative %: 64 %
Platelets: 263 10*3/uL (ref 150–400)
RBC: 3.27 MIL/uL — ABNORMAL LOW (ref 3.87–5.11)
RDW: 17.9 % — ABNORMAL HIGH (ref 11.5–15.5)
WBC: 17.6 10*3/uL — ABNORMAL HIGH (ref 4.0–10.5)
nRBC: 0.2 % (ref 0.0–0.2)

## 2022-01-25 LAB — CBC
HCT: 22 % — ABNORMAL LOW (ref 36.0–46.0)
Hemoglobin: 6.9 g/dL — CL (ref 12.0–15.0)
MCH: 24.6 pg — ABNORMAL LOW (ref 26.0–34.0)
MCHC: 31.4 g/dL (ref 30.0–36.0)
MCV: 78.6 fL — ABNORMAL LOW (ref 80.0–100.0)
Platelets: 277 10*3/uL (ref 150–400)
RBC: 2.8 MIL/uL — ABNORMAL LOW (ref 3.87–5.11)
RDW: 17.6 % — ABNORMAL HIGH (ref 11.5–15.5)
WBC: 16.9 10*3/uL — ABNORMAL HIGH (ref 4.0–10.5)
nRBC: 0.2 % (ref 0.0–0.2)

## 2022-01-25 LAB — ECHOCARDIOGRAM COMPLETE
AR max vel: 2.19 cm2
AV Area VTI: 2.17 cm2
AV Area mean vel: 2.22 cm2
AV Mean grad: 5 mmHg
AV Peak grad: 7.8 mmHg
Ao pk vel: 1.4 m/s
Area-P 1/2: 2.95 cm2
Calc EF: 62.8 %
Height: 62.5 in
MV M vel: 3.34 m/s
MV Peak grad: 44.6 mmHg
S' Lateral: 3.1 cm
Single Plane A2C EF: 60.9 %
Single Plane A4C EF: 63.6 %
Weight: 4225.78 oz

## 2022-01-25 LAB — PREPARE RBC (CROSSMATCH)

## 2022-01-25 LAB — IRON AND TIBC
Iron: 153 ug/dL (ref 28–170)
Saturation Ratios: 57 % — ABNORMAL HIGH (ref 10.4–31.8)
TIBC: 269 ug/dL (ref 250–450)
UIBC: 116 ug/dL

## 2022-01-25 LAB — ALBUMIN: Albumin: 2.3 g/dL — ABNORMAL LOW (ref 3.5–5.0)

## 2022-01-25 LAB — HIV ANTIBODY (ROUTINE TESTING W REFLEX): HIV Screen 4th Generation wRfx: NONREACTIVE

## 2022-01-25 SURGERY — ESOPHAGOGASTRODUODENOSCOPY (EGD) WITH PROPOFOL
Anesthesia: Monitor Anesthesia Care

## 2022-01-25 MED ORDER — SODIUM CHLORIDE 0.9 % IV SOLN: INTRAVENOUS | Status: DC

## 2022-01-25 MED ORDER — SODIUM CHLORIDE 0.9% IV SOLUTION: Freq: Once | INTRAVENOUS | Status: AC

## 2022-01-25 MED ORDER — PROPOFOL 500 MG/50ML IV EMUL
INTRAVENOUS | Status: DC | PRN
  Administered 2022-01-25: 130 ug/kg/min via INTRAVENOUS

## 2022-01-25 MED ORDER — PROPOFOL 500 MG/50ML IV EMUL
INTRAVENOUS | Status: AC
  Filled 2022-01-25: qty 50

## 2022-01-25 MED ORDER — ACETAMINOPHEN 325 MG PO TABS
650.0000 mg | ORAL_TABLET | ORAL | Status: DC | PRN
  Administered 2022-01-25 – 2022-01-26 (×3): 650 mg via ORAL
  Filled 2022-01-25 (×4): qty 2

## 2022-01-25 MED ORDER — PROPOFOL 10 MG/ML IV BOLUS
INTRAVENOUS | Status: DC | PRN
  Administered 2022-01-25: 10 mg via INTRAVENOUS
  Administered 2022-01-25 (×2): 20 mg via INTRAVENOUS

## 2022-01-25 MED ORDER — IOHEXOL 350 MG/ML SOLN
100.0000 mL | Freq: Once | INTRAVENOUS | Status: AC | PRN
  Administered 2022-01-25: 100 mL via INTRAVENOUS

## 2022-01-25 MED ORDER — PEG-KCL-NACL-NASULF-NA ASC-C 100 G PO SOLR
0.5000 | Freq: Once | ORAL | Status: AC
  Administered 2022-01-25: 100 g via ORAL
  Filled 2022-01-25: qty 1

## 2022-01-25 MED ORDER — CHLORHEXIDINE GLUCONATE CLOTH 2 % EX PADS
6.0000 | MEDICATED_PAD | Freq: Every day | CUTANEOUS | Status: DC
  Administered 2022-01-25: 6 via TOPICAL

## 2022-01-25 MED ORDER — PEG-KCL-NACL-NASULF-NA ASC-C 100 G PO SOLR: 1.0000 | Freq: Once | ORAL | Status: DC

## 2022-01-25 MED ORDER — ACETAMINOPHEN 650 MG RE SUPP: 650.0000 mg | Freq: Four times a day (QID) | RECTAL | Status: DC | PRN

## 2022-01-25 MED ORDER — LACTATED RINGERS IV SOLN: INTRAVENOUS | Status: DC

## 2022-01-25 SURGICAL SUPPLY — 15 items

## 2022-01-25 NOTE — Progress Notes (Signed)
Echocardiogram not complete, patient is undergoing other testing.  [name]

## 2022-01-25 NOTE — Progress Notes (Signed)
Bilateral lower extremity venous duplex has been completed. Preliminary results can be found in CV Proc through chart review.   01/25/22 8:31 AM [name] RVT

## 2022-01-25 NOTE — Consult Note (Addendum)
Consultation Note   Referring Provider: [facility] PCP: Burnis Medin, MD Primary Gastroenterologist: Oretha Caprice, MD     Reason for consultation: [facility] Day: 2  Assessment / Plan   # 41 yo female with longstanding Crohn's ileocolitis, s/p ileocecectomy in 2011. Maintained on Avesola Q 8 weeks. Currently admitted with melena and acute anemia. BUN elevated at 39 in ED. This seems like an upper GI bleed but right colon source also possible.  Received a u PRBC last night, follow up hgb this am not improved ( 7.2 >> 6.9). She may need another unit of blood  Will likely start with an EGD, possibly today. The risks and benefits of EGD with possible biopsies were discussed with the patient who agrees to proceed.  Continue PPI infusion.    # See PMH for additional medical problems   HPI   [name] is a 41 y.o. female with a past medical history significant for Crohn's Ileocolitis, fibroids, obesity, appendectomy, ventral hernia repair.   See PMH for any additional medical problems.  [name] is followed by [name] for a history of Crohn's disease. She was diagnosed in 2007, underwent ileocecectomy in 2011 for active disease and persistent fistula.  She had been on Remicade but recently switched to Avesola Q 8 weeks. She has been doing well from a Crohn's disease standpoint.   Patient presented to ED yesterday after syncopal episode at work. Yesterday morning she passed a dark stool followed shortly after by a black stool.  She went about her day feeling fine. Then later in the day she began feeling warm. She subsequently had a syncopal episode. She typically doesn't have dark stool or blood in stools. On Amoxicillin for a sinus infection her bowels have been a little loose. She doesn't take NSAIDS.   Pertinent labs / imaging thus far:  WBC 20K >> now 16.9 Hgb 7.2 ( baseline 12) MCV 78 BUN ^ 25, normal  creatinine Platelets 277 Albumin 2.3 / Ca+ low at 7.4 CK ^ 267 Trop 2 CXR without acute findings.  Head CT scan + right parietal scalp hematoma CTA chest negative for PE    Recent antibiotics for a sinus infection   Previous GI Evaluation     Oct 2018 colonoscopy  -Widely patent IC anastomosis from 2011 ileocectomy. - A few small erosions and mild inflammation just proximal to the IC anastomosis, biopsied. - Normal colonic mucosa, biopsied. Diagnosis 1. Colon, biopsy, neoterminal ilium - FOCAL EROSION WITH EXUDATE AND MILD CHRONIC ACTIVE INFLAMMATION. - NO GRANULOMAS OR DYSPLASIA. 2. Colon, biopsy, random - COLONIC MUCOSA WITH NO SIGNIFICANT PATHOLOGIC CHANGES. - NO MICROSCOPIC COLITIS, ACTIVE INFLAMMATION, CHRONIC CHANGES, OR GRANULOMAS. - NEGATIVE FOR DYSPLASIA.   Recent Labs and Imaging VAS [name] LOWER EXTREMITY VENOUS (DVT)  Result Date: 01/25/2022  Lower Venous DVT Study Patient Name:  [name]  Date of Exam:   01/25/2022 Medical Rec #: [id number]           Accession #:    [id number] Date of Birth: February 13, 1981          Patient Gender: F Patient Age:   70 years Exam Location:  [facility] Procedure:      VAS [name]  LOWER EXTREMITY VENOUS (DVT) Referring Phys: Cherylann Ratel --------------------------------------------------------------------------------  Indications: Elevated Ddimer.  Risk Factors: None identified. Limitations: Body habitus and poor ultrasound/tissue interface. Comparison Study: No prior studies. Performing Technologist: Oliver Hum RVT  Examination Guidelines: A complete evaluation includes B-mode imaging, spectral Doppler, color Doppler, and power Doppler as needed of all accessible portions of each vessel. Bilateral testing is considered an integral part of a complete examination. Limited examinations for reoccurring indications may be performed as noted. The reflux portion of the exam is performed with the patient in reverse Trendelenburg.   +---------+---------------+---------+-----------+----------+--------------+ RIGHT    CompressibilityPhasicitySpontaneityPropertiesThrombus Aging +---------+---------------+---------+-----------+----------+--------------+ CFV      Full           Yes      Yes                                 +---------+---------------+---------+-----------+----------+--------------+ SFJ      Full                                                        +---------+---------------+---------+-----------+----------+--------------+ FV Prox  Full                                                        +---------+---------------+---------+-----------+----------+--------------+ FV Mid   Full                                                        +---------+---------------+---------+-----------+----------+--------------+ FV DistalFull           Yes      Yes                                 +---------+---------------+---------+-----------+----------+--------------+ PFV      Full                                                        +---------+---------------+---------+-----------+----------+--------------+ POP      Full           Yes      Yes                                 +---------+---------------+---------+-----------+----------+--------------+ PTV      Full                                                        +---------+---------------+---------+-----------+----------+--------------+ PERO     Full                                                        +---------+---------------+---------+-----------+----------+--------------+   +---------+---------------+---------+-----------+----------+--------------+  LEFT     CompressibilityPhasicitySpontaneityPropertiesThrombus Aging +---------+---------------+---------+-----------+----------+--------------+ CFV      Full           Yes      Yes                                  +---------+---------------+---------+-----------+----------+--------------+ SFJ      Full                                                        +---------+---------------+---------+-----------+----------+--------------+ FV Prox  Full                                                        +---------+---------------+---------+-----------+----------+--------------+ FV Mid   Full                                                        +---------+---------------+---------+-----------+----------+--------------+ FV DistalFull                                                        +---------+---------------+---------+-----------+----------+--------------+ PFV      Full                                                        +---------+---------------+---------+-----------+----------+--------------+ POP      Full           Yes      Yes                                 +---------+---------------+---------+-----------+----------+--------------+ PTV      Full                                                        +---------+---------------+---------+-----------+----------+--------------+ PERO     Full                                                        +---------+---------------+---------+-----------+----------+--------------+    Summary: RIGHT: - There is no evidence of deep vein thrombosis in the lower extremity. However, portions of this examination were limited- see technologist comments above.  - No cystic structure found in the popliteal fossa.  LEFT: - There is no evidence of  deep vein thrombosis in the lower extremity. However, portions of this examination were limited- see technologist comments above.  - No cystic structure found in the popliteal fossa.  *See table(s) above for measurements and observations.    Preliminary    CT Angio Chest PE W and/or Wo Contrast  Result Date: 01/24/2022 CLINICAL DATA:  Syncope, positive D-dimer EXAM: CT ANGIOGRAPHY CHEST  WITH CONTRAST TECHNIQUE: Multidetector CT imaging of the chest was performed using the standard protocol during bolus administration of intravenous contrast. Multiplanar CT image reconstructions and MIPs were obtained to evaluate the vascular anatomy. RADIATION DOSE REDUCTION: This exam was performed according to the departmental dose-optimization program which includes automated exposure control, adjustment of the mA and/or kV according to patient size and/or use of iterative reconstruction technique. CONTRAST:  175m OMNIPAQUE IOHEXOL 350 MG/ML SOLN COMPARISON:  Chest radiograph done earlier today FINDINGS: Cardiovascular: There is homogeneous enhancement in thoracic aorta. There are no intraluminal filling defects in central pulmonary artery branches. Evaluation of small peripheral branches he is limited by motion artifacts. Mediastinum/Nodes: No significant lymphadenopathy is seen. Lungs/Pleura: There is no focal pulmonary consolidation. There is no pleural effusion or pneumothorax. Upper Abdomen: Unremarkable. Musculoskeletal: Unremarkable. Review of the MIP images confirms the above findings. IMPRESSION: There is no evidence of central pulmonary artery embolism. There is no evidence of thoracic aortic dissection. There is no focal pulmonary consolidation. Electronically Signed   By: PElmer PickerM.D.   On: 01/24/2022 14:59   CT Head Wo Contrast  Result Date: 01/24/2022 CLINICAL DATA:  Provided history: Headache, syncope. Additional history provided: Head trauma. EXAM: CT HEAD WITHOUT CONTRAST TECHNIQUE: Contiguous axial images were obtained from the base of the skull through the vertex without intravenous contrast. RADIATION DOSE REDUCTION: This exam was performed according to the departmental dose-optimization program which includes automated exposure control, adjustment of the mA and/or kV according to patient size and/or use of iterative reconstruction technique. COMPARISON:  None Available.  FINDINGS: Brain: Cerebral volume is normal. There is no acute intracranial hemorrhage. No demarcated cortical infarct. No extra-axial fluid collection. No evidence of an intracranial mass. No midline shift. Vascular: No hyperdense vessel. Skull: No fracture or aggressive osseous lesion. Sinuses/Orbits: No mass or acute finding within the imaged orbits. No significant paranasal sinus disease. Other: Right parietal scalp hematoma. IMPRESSION: No evidence of acute intracranial abnormality. Right parietal scalp hematoma. Electronically Signed   By: KKellie SimmeringD.O.   On: 01/24/2022 14:59   DG Chest 1 View  Result Date: 01/24/2022 CLINICAL DATA:  Syncope EXAM: CHEST  1 VIEW COMPARISON:  Chest radiograph 11/09/2010 FINDINGS: The cardiomediastinal silhouette is normal. Lung volumes are significantly diminished. There is no focal consolidation or pulmonary edema. There is no pleural effusion or pneumothorax There is no acute osseous abnormality. IMPRESSION: Low lung volumes. Otherwise, no radiographic evidence of acute cardiopulmonary process. Electronically Signed   By: PValetta MoleM.D.   On: 01/24/2022 12:57    Labs:  Recent Labs    01/24/22 1223 01/25/22 0632  WBC 20.5* 16.9*  HGB 7.2* 6.9*  HCT 23.5* 22.0*  PLT 344 277   Recent Labs    01/24/22 1223 01/25/22 0328  NA 134* 136  K 3.7 3.8  CL 106 108  CO2 22 23  GLUCOSE 132* 97  BUN 39* 25*  CREATININE 0.56 0.63  CALCIUM 7.6* 7.4*   Recent Labs    01/25/22 0328  PROT 5.2*  ALBUMIN 2.3*  2.3*  AST 14*  ALT 12  ALKPHOS 42  BILITOT 0.4   No results for input(s): "HEPBSAG", "HCVAB", "HEPAIGM", "HEPBIGM" in the last 72 hours. No results for input(s): "LABPROT", "INR" in the last 72 hours.  Past Medical History:  Diagnosis Date   Abrasion of upper arm with infection, left, sequela    started antibiotics lef arm oct 4th started antibiotics healing well   Anemia    hx of 2 yrs ago   Chicken pox    Chronic rhinitis    Contact  dermatitis and other eczema, due to unspecified cause    Crohn's 11-15-11   no issues in 2 yrs   Fibroids 11-15-11   remains with fibroids   Hernia    Incisional hernia, RLQ 10/02/2011   Infertility, female    Mononucleosis    Obesity    Pilonidal abscess    Rectal abscess    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[place])    Vitamin D deficiency     Past Surgical History:  Procedure Laterality Date   APPENDECTOMY  7/06   CESAREAN SECTION N/A 11/30/2020   Procedure: CESAREAN SECTION;  Surgeon: Everett Graff, MD;  Location: [facility] OR[facility];  Service: Obstetrics;  Laterality: N/A;   COLONOSCOPY WITH PROPOFOL N/A 03/07/2017   Procedure: COLONOSCOPY WITH PROPOFOL;  Surgeon: Milus Banister, MD;  Location: [facility] END[facility]COPY;  Service: Endoscopy;  Laterality: N/A;   EYE SURGERY  11-15-11   2003-multiple stys   ileocecal resection  1/11   crohns disease with fisula/stricture. Dr. Ronnald Collum   INCISIONAL HERNIA REPAIR N/A 12/24/2018   Procedure: OPEN REPAIR VENTRAL INCISIONAL HERNIA WITH MESH PATCH AND PRIMARY REPAIR OF LEFT SPIGELIAN HERNIA;  Surgeon: Armandina Gemma, MD;  Location: [facility] ORS;  Service: General;  Laterality: N/A;   KNEE ARTHROSCOPY  99, 02   Bil.   MYOMECTOMY N/A 11/10/2014   Procedure: MYOMECTOMY;  Surgeon: Everett Graff, MD;  Location: [facility];  Service: Gynecology;  Laterality: N/A;   PILONIDAL CYST EXCISION  2011, 2012   I&Ds   VENTRAL HERNIA REPAIR  11/27/2011   Procedure: LAPAROSCOPIC VENTRAL HERNIA;  Surgeon: Adin Hector, MD;  Location: [facility];  Service: General;  Laterality: N/A;  Laparoscopic Exploration & Repair of Hernia in Abdomen   WISDOM TOOTH EXTRACTION      Family History  Problem Relation Age of Onset   Aneurysm Mother        brain   Heart disease Mother 76       heart anyrism   Healthy Father        fairly   Diabetes Sister        half sister   Bipolar disorder Sister        half sister   Colon cancer Maternal Grandmother    Stomach cancer Paternal Grandfather     Multiple sclerosis Sister     Prior to Admission medications   Medication Sig Start Date End Date Taking? Authorizing Provider  Calcium Carb-Cholecalciferol (CALCIUM/VITAMIN D) 600-400 MG-UNIT TABS Take 1 tablet by mouth daily.   Yes [provider]  fexofenadine (ALLEGRA) 180 MG tablet Take 180 mg by mouth daily.   Yes [provider]  fluticasone (FLONASE) 50 MCG/ACT nasal spray Place 1 spray into both nostrils daily as needed for allergies.   Yes [provider]  metFORMIN (GLUCOPHAGE) 500 MG tablet Take 1 tablet (500 mg total) by mouth 2 (two) times daily with a meal. 12/26/21  Yes Danford, Valetta Fuller D, NP  naproxen (NAPROSYN) 500 MG tablet  Take 500 mg by mouth 2 (two) times daily. 01/11/22  Yes [provider]  nystatin-triamcinolone ointment (MYCOLOG) Apply 1 Application topically 2 (two) times daily as needed (rash). 06/12/21  Yes [provider]  Prenatal Vit-Fe Fumarate-FA (PRENATAL MULTIVITAMIN) TABS tablet Take 1 tablet by mouth daily at 12 noon.   Yes [provider]  Vitamin D, Cholecalciferol, 50 MCG (2000 UT) CAPS Take 2,000 Units by mouth daily.   Yes [provider]  amoxicillin-clavulanate (AUGMENTIN) 875-125 MG tablet Take 1 tablet by mouth 2 (two) times daily. 01/11/22   [provider]    Current Facility-Administered Medications  Medication Dose Route Frequency Provider Last Rate Last Admin   0.9 %  sodium chloride infusion (Manually program via Guardrails IV Fluids)   Intravenous Once Barb Merino, MD       0.9 %  sodium chloride infusion  10 mL/hr Intravenous Once Marylyn Ishihara, Tyrone A, DO       acetaminophen (TYLENOL) tablet 650 mg  650 mg Oral Q6H PRN Marylyn Ishihara, Tyrone A, DO       Or   acetaminophen (TYLENOL) suppository 650 mg  650 mg Rectal Q6H PRN Marylyn Ishihara, Tyrone A, DO       calcium-vitamin D (OSCAL WITH D) 500-5 MG-MCG per tablet 1 tablet  1 tablet Oral QAC breakfast Kyle, Tyrone A, DO       fluticasone  (FLONASE) 50 MCG/ACT nasal spray 1 spray  1 spray Each Nare Daily PRN Marylyn Ishihara, Tyrone A, DO       loratadine (CLARITIN) tablet 10 mg  10 mg Oral Daily Kyle, Tyrone A, DO       ondansetron (ZOFRAN) tablet 4 mg  4 mg Oral Q6H PRN Marylyn Ishihara, Tyrone A, DO       Or   ondansetron (ZOFRAN) injection 4 mg  4 mg Intravenous Q6H PRN Marylyn Ishihara, Tyrone A, DO       pantoprozole (PROTONIX) 80 mg /NS 100 mL infusion  8 mg/hr Intravenous Continuous Kyle, Tyrone A, DO 10 mL/hr at 01/25/22 0343 8 mg/hr at 01/25/22 0343    Allergies as of 01/24/2022   (No Known Allergies)    Social History   Socioeconomic History   Marital status: Married    Spouse name: Cadance Raus   Number of children: Not on file   Years of education: Not on file   Highest education level: Bachelor's degree (e.g., BA, AB, BS)  Occupational History   Occupation: Music therapist  Tobacco Use   Smoking status: Never   Smokeless tobacco: Never  Vaping Use   Vaping Use: Never used  Substance and Sexual Activity   Alcohol use: No   Drug use: No   Sexual activity: Yes    Partners: Male  Other Topics Concern   Not on file  Social History Narrative   married   [place] of 2   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 in math    No pets   Sleep 6-7 hours    BF with 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   Walking exercise   Eats balanced generally   Social Determinants of Health   Financial Resource Strain: Low Risk  (04/02/2021)   Overall Financial Resource Strain (CARDIA)    Difficulty of Paying Living Expenses: Not hard at all  Food Insecurity: No Food Insecurity (04/02/2021)   Hunger Vital Sign    Worried About Running Out of Food in the Last Year: Never true    Ran Out of Food in  the Last Year: Never true  Transportation Needs: No Transportation Needs (04/02/2021)   PRAPARE - Hydrologist (Medical): No    Lack of Transportation (Non-Medical): No  Physical Activity: Unknown (04/02/2021)   Exercise Vital Sign     Days of Exercise per Week: 0 days    Minutes of Exercise per Session: Not on file  Stress: No Stress Concern Present (04/02/2021)   [place]    Feeling of Stress : Not at all  Social Connections: Brave (04/02/2021)   Social Connection and Isolation Panel [NHANES]    Frequency of Communication with Friends and Family: More than three times a week    Frequency of Social Gatherings with Friends and Family: Patient refused    Attends Religious Services: More than 4 times per year    Active Member of [employer] or Organizations: Yes    Attends Archivist Meetings: 1 to 4 times per year    Marital Status: Married  Human resources officer Violence: Not on file    Review of Systems: All systems reviewed and negative except where noted in HPI.  Physical Exam: Vital signs in last 24 hours: Temp:  [97.4 F (36.3 C)-99 F (37.2 C)] 98.5 F (36.9 C) (08/31 0800) Pulse Rate:  [83-106] 96 (08/31 0800) Resp:  [16-31] 18 (08/31 0800) BP: (90-135)/(51-78) 111/52 (08/31 0800) SpO2:  [96 %-100 %] 98 % (08/31 0800) Weight:  [119.8 kg] 119.8 kg (08/30 1934) Last BM Date : 01/24/22  General:  Alert female in NAD Psych:  Pleasant, cooperative. Normal mood and affect Eyes: Pupils equal, no icterus. Conjunctive pink Ears:  Normal auditory acuity Nose: No deformity, discharge or lesions Neck:  Supple, no masses felt Lungs:  Clear to auscultation.  Heart:  Regular rate, regular rhythm. No lower extremity edema Abdomen:  Soft, nondistended, nontender, active bowel sounds, no masses felt Rectal :  Deferred Msk: Symmetrical without gross deformities.  Neurologic:  Alert, oriented, grossly normal neurologically Skin:  Intact without significant lesions.    Intake/Output from previous day: 08/30 0701 - 08/31 0700 In: 1491.2 [I.V.:80.9; Blood:315; IV Piggyback:1095.3] Out: 3350 [Urine:3350] Intake/Output this shift:  No  intake/output data recorded.    Principal Problem:   Symptomatic anemia Active Problems:   GIB (gastrointestinal bleeding)   History of Crohn's disease   Leukocytosis   Near syncope   Elevated d-dimer   Hypocalcemia    Tye Savoy, NP-C @  01/25/2022, 8:50 AM    Attending physician's note   I have taken history, reviewed the chart and examined the patient. I performed a substantive portion of this encounter, including complete performance of at least one of the key components, in conjunction with the APP. I agree with the Advanced Practitioner's note, impression and recommendations.   Melena s/o UGI bleed. Hb 12 (baseline) to 6.9 with syncope s/p 1U. Elevated BUN. Has been on Naproxen for ankle pain Crohn's ileocolitis s/p ileocecal resection 2011 on  infliximab [name] Q8weekly. Last colon 02/2017 with  few anastomotic erosions. Microcytic anemia d/t crohns  Plan: -IV  Protonix. -Trend CBC -EGD today. -Stop all NSAIDs -If neg EGD, CTA followed by colon (if needed)  Discussed risks and benefits   Carmell Austria, MD [facility] [phone]

## 2022-01-25 NOTE — Anesthesia Postprocedure Evaluation (Signed)
Anesthesia Post Note  Patient: [name]  Procedure(s) Performed: ESOPHAGOGASTRODUODENOSCOPY (EGD) WITH PROPOFOL BIOPSY     Patient location during evaluation: Endoscopy Anesthesia Type: MAC Level of consciousness: awake Pain management: pain level controlled Vital Signs Assessment: post-procedure vital signs reviewed and stable Respiratory status: spontaneous breathing Cardiovascular status: stable Postop Assessment: no apparent nausea or vomiting Anesthetic complications: no   No notable events documented.  Last Vitals:  Vitals:   01/25/22 1150 01/25/22 1200  BP: (!) 120/48 (!) 113/59  Pulse:  83  Resp: (!) 28 (!) 25  Temp: 36.8 C   SpO2: 100% 99%    Last Pain:  Vitals:   01/25/22 1200  TempSrc:   PainSc: 0-No pain                 John F Salome Arnt

## 2022-01-25 NOTE — Transfer of Care (Signed)
Immediate Anesthesia Transfer of Care Note  Patient: [name]  Procedure(s) Performed: ESOPHAGOGASTRODUODENOSCOPY (EGD) WITH PROPOFOL BIOPSY  Patient Location: PACU  Anesthesia Type:MAC  Level of Consciousness: sedated  Airway & Oxygen Therapy: Patient Spontanous Breathing and Patient connected to face mask oxygen  Post-op Assessment: Report given to RN and Post -op Vital signs reviewed and stable  Post vital signs: Reviewed and stable  Last Vitals:  Vitals Value Taken Time  BP 135/60 01/25/22 1106  Temp 37.1 C 01/25/22 1106  Pulse 88 01/25/22 1106  Resp 24 01/25/22 1106  SpO2 99 % 01/25/22 1106    Last Pain:  Vitals:   01/25/22 1106  TempSrc: Temporal  PainSc: 0-No pain         Complications: No notable events documented.

## 2022-01-25 NOTE — Progress Notes (Signed)
PROGRESS NOTE    [name]  TOI:[id number] DOB: 03/11/1981 DOA: 01/24/2022 PCP: Burnis Medin, MD    Brief Narrative:  41 year old female with history of stable Crohn's disease not on treatment presented to the emergency room with near syncopal episode.  Recently suffered from sinusitis and was using amoxicillin but denies any NSAIDs use.  Did not have syncope.  In the emergency room hemodynamically stable, hemoglobin was 7.2 with recent hemoglobin of 12.  Hemoccult positive.  Admitted with GI consultation.   Assessment & Plan:   Acute blood loss anemia, symptomatic anemia with dizziness lightheadedness, FOBT positive. Hemoglobin 7.2-1 to PRBC-6.9-1 additional unit today.  Clinically stable. Remains on Protonix infusion. Monitoring hemoglobin every 8 hours and transfuse to keep more than 7.  Iron levels are adequate. GI consulted.  Scheduled for upper GI endoscopy today.  If negative, may need colonoscopy. Describes regular menstrual periods, not likely GU source.  Crohn's disease: Not likely active because of no abdominal pain.  Leukocytosis: Likely reactive.  Stable.   DVT prophylaxis: SCDs Start: 01/24/22 1849   Code Status: Full Family Communication: None Disposition Plan: Status is: Inpatient Remains inpatient appropriate because: Blood transfusions, inpatient procedures     Consultants:  Gastroenterology  Procedures:  EGD planned  Antimicrobials:  None   Subjective: Patient seen and examined.  No overnight events.  Discussed about possible EGD today in the morning rounds.  She was agreeable.  Denies any nausea vomiting abdominal pain bloating.  She had few loose bowel movements.  Objective: Vitals:   01/25/22 0800 01/25/22 0943 01/25/22 1010 01/25/22 1106  BP: (!) 111/52 (!) 110/55 120/60   Pulse: 96 93 99 88  Resp: 18 18 18  (!) 24  Temp: 98.5 F (36.9 C) 98.3 F (36.8 C) 98.3 F (36.8 C) 98.7 F (37.1 C)  TempSrc: Oral Oral  Temporal   SpO2: 98% 97%  99%  Weight:    119.8 kg  Height:    5' 2.5" (1.588 m)    Intake/Output Summary (Last 24 hours) at 01/25/2022 1109 Last data filed at 01/25/2022 0503 Gross per 24 hour  Intake 1491.15 ml  Output 3350 ml  Net -1858.85 ml   Filed Weights   01/24/22 1934 01/25/22 1106  Weight: 119.8 kg 119.8 kg    Examination:  General exam: Appears calm and comfortable  Respiratory system: Clear to auscultation. Respiratory effort normal. Cardiovascular system: S1 & S2 heard, RRR. No JVD, murmurs, rubs, gallops or clicks. No pedal edema. Gastrointestinal system: Abdomen is nondistended, soft and nontender. No organomegaly or masses felt. Normal bowel sounds heard. Central nervous system: Alert and oriented. No focal neurological deficits. Extremities: Symmetric 5 x 5 power. Skin: No rashes, lesions or ulcers Psychiatry: Judgement and insight appear normal. Mood & affect appropriate.     Data Reviewed: I have personally reviewed following labs and imaging studies  CBC: Recent Labs  Lab 01/24/22 1223 01/25/22 0632  WBC 20.5* 16.9*  HGB 7.2* 6.9*  HCT 23.5* 22.0*  MCV 78.9* 78.6*  PLT 344 833   Basic Metabolic Panel: Recent Labs  Lab 01/24/22 1223 01/24/22 1238 01/25/22 0328  NA 134*  --  136  K 3.7  --  3.8  CL 106  --  108  CO2 22  --  23  GLUCOSE 132*  --  97  BUN 39*  --  25*  CREATININE 0.56  --  0.63  CALCIUM 7.6*  --  7.4*  MG  --  1.8  --  GFR: Estimated Creatinine Clearance: 118.5 mL/min (by C-G formula based on SCr of 0.63 mg/dL). Liver Function Tests: Recent Labs  Lab 01/25/22 0328  AST 14*  ALT 12  ALKPHOS 42  BILITOT 0.4  PROT 5.2*  ALBUMIN 2.3*  2.3*   No results for input(s): "LIPASE", "AMYLASE" in the last 168 hours. No results for input(s): "AMMONIA" in the last 168 hours. Coagulation Profile: No results for input(s): "INR", "PROTIME" in the last 168 hours. Cardiac Enzymes: Recent Labs  Lab 01/24/22 1238  CKTOTAL 267*    BNP (last 3 results) No results for input(s): "PROBNP" in the last 8760 hours. HbA1C: No results for input(s): "HGBA1C" in the last 72 hours. CBG: Recent Labs  Lab 01/24/22 1214  GLUCAP 125*   Lipid Profile: No results for input(s): "CHOL", "HDL", "LDLCALC", "TRIG", "CHOLHDL", "LDLDIRECT" in the last 72 hours. Thyroid Function Tests: No results for input(s): "TSH", "T4TOTAL", "FREET4", "T3FREE", "THYROIDAB" in the last 72 hours. Anemia Panel: Recent Labs    01/25/22 0328  TIBC 269  IRON 153   Sepsis Labs: No results for input(s): "PROCALCITON", "LATICACIDVEN" in the last 168 hours.  Recent Results (from the past 240 hour(s))  SARS Coronavirus 2 by RT PCR (hospital order, performed in [facility] hospital lab) *cepheid single result test* Anterior Nasal Swab     Status: None   Collection Time: 01/24/22 12:50 PM   Specimen: Anterior Nasal Swab  Result Value Ref Range Status   SARS Coronavirus 2 by RT PCR NEGATIVE NEGATIVE Final    Comment: (NOTE) SARS-CoV-2 target nucleic acids are NOT DETECTED.  The SARS-CoV-2 RNA is generally detectable in upper and lower respiratory specimens during the acute phase of infection. The lowest concentration of SARS-CoV-2 viral copies this assay can detect is 250 copies / mL. A negative result does not preclude SARS-CoV-2 infection and should not be used as the sole basis for treatment or other patient management decisions.  A negative result may occur with improper specimen collection / handling, submission of specimen other than nasopharyngeal swab, presence of viral mutation(s) within the areas targeted by this assay, and inadequate number of viral copies (<250 copies / mL). A negative result must be combined with clinical observations, patient history, and epidemiological information.  Fact Sheet for Patients:   https://www.patel.info/  Fact Sheet for Healthcare  Providers: https://hall.com/  This test is not yet approved or  cleared by the [place] FDA and has been authorized for detection and/or diagnosis of SARS-CoV-2 by FDA under an Emergency Use Authorization (EUA).  This EUA will remain in effect (meaning this test can be used) for the duration of the COVID-19 declaration under Section 564(b)(1) of the Act, 21 U.S.C. section 360bbb-3(b)(1), unless the authorization is terminated or revoked sooner.  Performed at [facility], [place] [address] [place], [place] [address]          Radiology Studies: VAS [name] LOWER EXTREMITY VENOUS (DVT)  Result Date: 01/25/2022  Lower Venous DVT Study Patient Name:  IVETH HEIDEMANN  Date of Exam:   01/25/2022 Medical Rec #: [id number]           Accession #:    [id number] Date of Birth: Sep 23, 1980          Patient Gender: F Patient Age:   66 years Exam Location:  [facility] Procedure:      VAS [name] LOWER EXTREMITY VENOUS (DVT) Referring Phys: Cherylann Ratel --------------------------------------------------------------------------------  Indications: Elevated Ddimer.  Risk Factors: None identified.  Limitations: Body habitus and poor ultrasound/tissue interface. Comparison Study: No prior studies. Performing Technologist: Oliver Hum RVT  Examination Guidelines: A complete evaluation includes B-mode imaging, spectral Doppler, color Doppler, and power Doppler as needed of all accessible portions of each vessel. Bilateral testing is considered an integral part of a complete examination. Limited examinations for reoccurring indications may be performed as noted. The reflux portion of the exam is performed with the patient in reverse Trendelenburg.  +---------+---------------+---------+-----------+----------+--------------+ RIGHT    CompressibilityPhasicitySpontaneityPropertiesThrombus Aging  +---------+---------------+---------+-----------+----------+--------------+ CFV      Full           Yes      Yes                                 +---------+---------------+---------+-----------+----------+--------------+ SFJ      Full                                                        +---------+---------------+---------+-----------+----------+--------------+ FV Prox  Full                                                        +---------+---------------+---------+-----------+----------+--------------+ FV Mid   Full                                                        +---------+---------------+---------+-----------+----------+--------------+ FV DistalFull           Yes      Yes                                 +---------+---------------+---------+-----------+----------+--------------+ PFV      Full                                                        +---------+---------------+---------+-----------+----------+--------------+ POP      Full           Yes      Yes                                 +---------+---------------+---------+-----------+----------+--------------+ PTV      Full                                                        +---------+---------------+---------+-----------+----------+--------------+ PERO     Full                                                        +---------+---------------+---------+-----------+----------+--------------+   +---------+---------------+---------+-----------+----------+--------------+  LEFT     CompressibilityPhasicitySpontaneityPropertiesThrombus Aging +---------+---------------+---------+-----------+----------+--------------+ CFV      Full           Yes      Yes                                 +---------+---------------+---------+-----------+----------+--------------+ SFJ      Full                                                         +---------+---------------+---------+-----------+----------+--------------+ FV Prox  Full                                                        +---------+---------------+---------+-----------+----------+--------------+ FV Mid   Full                                                        +---------+---------------+---------+-----------+----------+--------------+ FV DistalFull                                                        +---------+---------------+---------+-----------+----------+--------------+ PFV      Full                                                        +---------+---------------+---------+-----------+----------+--------------+ POP      Full           Yes      Yes                                 +---------+---------------+---------+-----------+----------+--------------+ PTV      Full                                                        +---------+---------------+---------+-----------+----------+--------------+ PERO     Full                                                        +---------+---------------+---------+-----------+----------+--------------+    Summary: RIGHT: - There is no evidence of deep vein thrombosis in the lower extremity. However, portions of this examination were limited- see technologist comments above.  - No cystic structure found in the popliteal fossa.  LEFT: - There is no evidence of  deep vein thrombosis in the lower extremity. However, portions of this examination were limited- see technologist comments above.  - No cystic structure found in the popliteal fossa.  *See table(s) above for measurements and observations.    Preliminary    CT Angio Chest PE W and/or Wo Contrast  Result Date: 01/24/2022 CLINICAL DATA:  Syncope, positive D-dimer EXAM: CT ANGIOGRAPHY CHEST WITH CONTRAST TECHNIQUE: Multidetector CT imaging of the chest was performed using the standard protocol during bolus administration of intravenous  contrast. Multiplanar CT image reconstructions and MIPs were obtained to evaluate the vascular anatomy. RADIATION DOSE REDUCTION: This exam was performed according to the departmental dose-optimization program which includes automated exposure control, adjustment of the mA and/or kV according to patient size and/or use of iterative reconstruction technique. CONTRAST:  146m OMNIPAQUE IOHEXOL 350 MG/ML SOLN COMPARISON:  Chest radiograph done earlier today FINDINGS: Cardiovascular: There is homogeneous enhancement in thoracic aorta. There are no intraluminal filling defects in central pulmonary artery branches. Evaluation of small peripheral branches he is limited by motion artifacts. Mediastinum/Nodes: No significant lymphadenopathy is seen. Lungs/Pleura: There is no focal pulmonary consolidation. There is no pleural effusion or pneumothorax. Upper Abdomen: Unremarkable. Musculoskeletal: Unremarkable. Review of the MIP images confirms the above findings. IMPRESSION: There is no evidence of central pulmonary artery embolism. There is no evidence of thoracic aortic dissection. There is no focal pulmonary consolidation. Electronically Signed   By: PElmer PickerM.D.   On: 01/24/2022 14:59   CT Head Wo Contrast  Result Date: 01/24/2022 CLINICAL DATA:  Provided history: Headache, syncope. Additional history provided: Head trauma. EXAM: CT HEAD WITHOUT CONTRAST TECHNIQUE: Contiguous axial images were obtained from the base of the skull through the vertex without intravenous contrast. RADIATION DOSE REDUCTION: This exam was performed according to the departmental dose-optimization program which includes automated exposure control, adjustment of the mA and/or kV according to patient size and/or use of iterative reconstruction technique. COMPARISON:  None Available. FINDINGS: Brain: Cerebral volume is normal. There is no acute intracranial hemorrhage. No demarcated cortical infarct. No extra-axial fluid collection.  No evidence of an intracranial mass. No midline shift. Vascular: No hyperdense vessel. Skull: No fracture or aggressive osseous lesion. Sinuses/Orbits: No mass or acute finding within the imaged orbits. No significant paranasal sinus disease. Other: Right parietal scalp hematoma. IMPRESSION: No evidence of acute intracranial abnormality. Right parietal scalp hematoma. Electronically Signed   By: KKellie SimmeringD.O.   On: 01/24/2022 14:59   DG Chest 1 View  Result Date: 01/24/2022 CLINICAL DATA:  Syncope EXAM: CHEST  1 VIEW COMPARISON:  Chest radiograph 11/09/2010 FINDINGS: The cardiomediastinal silhouette is normal. Lung volumes are significantly diminished. There is no focal consolidation or pulmonary edema. There is no pleural effusion or pneumothorax There is no acute osseous abnormality. IMPRESSION: Low lung volumes. Otherwise, no radiographic evidence of acute cardiopulmonary process. Electronically Signed   By: PValetta MoleM.D.   On: 01/24/2022 12:57        Scheduled Meds:  [MAR Hold] calcium-vitamin D  1 tablet Oral QAC breakfast   [MAR Hold] loratadine  10 mg Oral Daily   Continuous Infusions:  [MAR Hold] sodium chloride     pantoprazole 8 mg/hr (01/25/22 0343)     LOS: 1 day    Time spent: 35 minutes    KBarb Merino MD [facility] Pager [phone]

## 2022-01-25 NOTE — H&P (View-Only) (Signed)
Consultation Note   Referring Provider: [facility] PCP: Burnis Medin, MD Primary Gastroenterologist: Oretha Caprice, MD     Reason for consultation: [facility] Day: 2  Assessment / Plan   # 41 yo female with longstanding Crohn's ileocolitis, s/p ileocecectomy in 2011. Maintained on Avesola Q 8 weeks. Currently admitted with melena and acute anemia. BUN elevated at 39 in ED. This seems like an upper GI bleed but right colon source also possible.  Received a u PRBC last night, follow up hgb this am not improved ( 7.2 >> 6.9). She may need another unit of blood  Will likely start with an EGD, possibly today. The risks and benefits of EGD with possible biopsies were discussed with the patient who agrees to proceed.  Continue PPI infusion.    # See PMH for additional medical problems   HPI   [name] is a 41 y.o. female with a past medical history significant for Crohn's Ileocolitis, fibroids, obesity, appendectomy, ventral hernia repair.   See PMH for any additional medical problems.  [name] is followed by [name] for a history of Crohn's disease. She was diagnosed in 2007, underwent ileocecectomy in 2011 for active disease and persistent fistula.  She had been on Remicade but recently switched to Avesola Q 8 weeks. She has been doing well from a Crohn's disease standpoint.   Patient presented to ED yesterday after syncopal episode at work. Yesterday morning she passed a dark stool followed shortly after by a black stool.  She went about her day feeling fine. Then later in the day she began feeling warm. She subsequently had a syncopal episode. She typically doesn't have dark stool or blood in stools. On Amoxicillin for a sinus infection her bowels have been a little loose. She doesn't take NSAIDS.   Pertinent labs / imaging thus far:  WBC 20K >> now 16.9 Hgb 7.2 ( baseline 12) MCV 78 BUN ^ 25, normal  creatinine Platelets 277 Albumin 2.3 / Ca+ low at 7.4 CK ^ 267 Trop 2 CXR without acute findings.  Head CT scan + right parietal scalp hematoma CTA chest negative for PE    Recent antibiotics for a sinus infection   Previous GI Evaluation     Oct 2018 colonoscopy  -Widely patent IC anastomosis from 2011 ileocectomy. - A few small erosions and mild inflammation just proximal to the IC anastomosis, biopsied. - Normal colonic mucosa, biopsied. Diagnosis 1. Colon, biopsy, neoterminal ilium - FOCAL EROSION WITH EXUDATE AND MILD CHRONIC ACTIVE INFLAMMATION. - NO GRANULOMAS OR DYSPLASIA. 2. Colon, biopsy, random - COLONIC MUCOSA WITH NO SIGNIFICANT PATHOLOGIC CHANGES. - NO MICROSCOPIC COLITIS, ACTIVE INFLAMMATION, CHRONIC CHANGES, OR GRANULOMAS. - NEGATIVE FOR DYSPLASIA.   Recent Labs and Imaging VAS [name] LOWER EXTREMITY VENOUS (DVT)  Result Date: 01/25/2022  Lower Venous DVT Study Patient Name:  [name]  Date of Exam:   01/25/2022 Medical Rec #: [id number]           Accession #:    [id number] Date of Birth: [birth year]          Patient Gender: F Patient Age:   32 years Exam Location:  [facility] Procedure:      VAS [name]  LOWER EXTREMITY VENOUS (DVT) Referring Phys: Cherylann Ratel --------------------------------------------------------------------------------  Indications: Elevated Ddimer.  Risk Factors: None identified. Limitations: Body habitus and poor ultrasound/tissue interface. Comparison Study: No prior studies. Performing Technologist: Oliver Hum RVT  Examination Guidelines: A complete evaluation includes B-mode imaging, spectral Doppler, color Doppler, and power Doppler as needed of all accessible portions of each vessel. Bilateral testing is considered an integral part of a complete examination. Limited examinations for reoccurring indications may be performed as noted. The reflux portion of the exam is performed with the patient in reverse Trendelenburg.   +---------+---------------+---------+-----------+----------+--------------+ RIGHT    CompressibilityPhasicitySpontaneityPropertiesThrombus Aging +---------+---------------+---------+-----------+----------+--------------+ CFV      Full           Yes      Yes                                 +---------+---------------+---------+-----------+----------+--------------+ SFJ      Full                                                        +---------+---------------+---------+-----------+----------+--------------+ FV Prox  Full                                                        +---------+---------------+---------+-----------+----------+--------------+ FV Mid   Full                                                        +---------+---------------+---------+-----------+----------+--------------+ FV DistalFull           Yes      Yes                                 +---------+---------------+---------+-----------+----------+--------------+ PFV      Full                                                        +---------+---------------+---------+-----------+----------+--------------+ POP      Full           Yes      Yes                                 +---------+---------------+---------+-----------+----------+--------------+ PTV      Full                                                        +---------+---------------+---------+-----------+----------+--------------+ PERO     Full                                                        +---------+---------------+---------+-----------+----------+--------------+   +---------+---------------+---------+-----------+----------+--------------+  LEFT     CompressibilityPhasicitySpontaneityPropertiesThrombus Aging +---------+---------------+---------+-----------+----------+--------------+ CFV      Full           Yes      Yes                                  +---------+---------------+---------+-----------+----------+--------------+ SFJ      Full                                                        +---------+---------------+---------+-----------+----------+--------------+ FV Prox  Full                                                        +---------+---------------+---------+-----------+----------+--------------+ FV Mid   Full                                                        +---------+---------------+---------+-----------+----------+--------------+ FV DistalFull                                                        +---------+---------------+---------+-----------+----------+--------------+ PFV      Full                                                        +---------+---------------+---------+-----------+----------+--------------+ POP      Full           Yes      Yes                                 +---------+---------------+---------+-----------+----------+--------------+ PTV      Full                                                        +---------+---------------+---------+-----------+----------+--------------+ PERO     Full                                                        +---------+---------------+---------+-----------+----------+--------------+    Summary: RIGHT: - There is no evidence of deep vein thrombosis in the lower extremity. However, portions of this examination were limited- see technologist comments above.  - No cystic structure found in the popliteal fossa.  LEFT: - There is no evidence of  deep vein thrombosis in the lower extremity. However, portions of this examination were limited- see technologist comments above.  - No cystic structure found in the popliteal fossa.  *See table(s) above for measurements and observations.    Preliminary    CT Angio Chest PE W and/or Wo Contrast  Result Date: 01/24/2022 CLINICAL DATA:  Syncope, positive D-dimer EXAM: CT ANGIOGRAPHY CHEST  WITH CONTRAST TECHNIQUE: Multidetector CT imaging of the chest was performed using the standard protocol during bolus administration of intravenous contrast. Multiplanar CT image reconstructions and MIPs were obtained to evaluate the vascular anatomy. RADIATION DOSE REDUCTION: This exam was performed according to the departmental dose-optimization program which includes automated exposure control, adjustment of the mA and/or kV according to patient size and/or use of iterative reconstruction technique. CONTRAST:  138m OMNIPAQUE IOHEXOL 350 MG/ML SOLN COMPARISON:  Chest radiograph done earlier today FINDINGS: Cardiovascular: There is homogeneous enhancement in thoracic aorta. There are no intraluminal filling defects in central pulmonary artery branches. Evaluation of small peripheral branches he is limited by motion artifacts. Mediastinum/Nodes: No significant lymphadenopathy is seen. Lungs/Pleura: There is no focal pulmonary consolidation. There is no pleural effusion or pneumothorax. Upper Abdomen: Unremarkable. Musculoskeletal: Unremarkable. Review of the MIP images confirms the above findings. IMPRESSION: There is no evidence of central pulmonary artery embolism. There is no evidence of thoracic aortic dissection. There is no focal pulmonary consolidation. Electronically Signed   By: PElmer PickerM.D.   On: 01/24/2022 14:59   CT Head Wo Contrast  Result Date: 01/24/2022 CLINICAL DATA:  Provided history: Headache, syncope. Additional history provided: Head trauma. EXAM: CT HEAD WITHOUT CONTRAST TECHNIQUE: Contiguous axial images were obtained from the base of the skull through the vertex without intravenous contrast. RADIATION DOSE REDUCTION: This exam was performed according to the departmental dose-optimization program which includes automated exposure control, adjustment of the mA and/or kV according to patient size and/or use of iterative reconstruction technique. COMPARISON:  None Available.  FINDINGS: Brain: Cerebral volume is normal. There is no acute intracranial hemorrhage. No demarcated cortical infarct. No extra-axial fluid collection. No evidence of an intracranial mass. No midline shift. Vascular: No hyperdense vessel. Skull: No fracture or aggressive osseous lesion. Sinuses/Orbits: No mass or acute finding within the imaged orbits. No significant paranasal sinus disease. Other: Right parietal scalp hematoma. IMPRESSION: No evidence of acute intracranial abnormality. Right parietal scalp hematoma. Electronically Signed   By: KKellie SimmeringD.O.   On: 01/24/2022 14:59   DG Chest 1 View  Result Date: 01/24/2022 CLINICAL DATA:  Syncope EXAM: CHEST  1 VIEW COMPARISON:  Chest radiograph 11/09/2010 FINDINGS: The cardiomediastinal silhouette is normal. Lung volumes are significantly diminished. There is no focal consolidation or pulmonary edema. There is no pleural effusion or pneumothorax There is no acute osseous abnormality. IMPRESSION: Low lung volumes. Otherwise, no radiographic evidence of acute cardiopulmonary process. Electronically Signed   By: PValetta MoleM.D.   On: 01/24/2022 12:57    Labs:  Recent Labs    01/24/22 1223 01/25/22 0632  WBC 20.5* 16.9*  HGB 7.2* 6.9*  HCT 23.5* 22.0*  PLT 344 277   Recent Labs    01/24/22 1223 01/25/22 0328  NA 134* 136  K 3.7 3.8  CL 106 108  CO2 22 23  GLUCOSE 132* 97  BUN 39* 25*  CREATININE 0.56 0.63  CALCIUM 7.6* 7.4*   Recent Labs    01/25/22 0328  PROT 5.2*  ALBUMIN 2.3*  2.3*  AST 14*  ALT 12  ALKPHOS 42  BILITOT 0.4   No results for input(s): "HEPBSAG", "HCVAB", "HEPAIGM", "HEPBIGM" in the last 72 hours. No results for input(s): "LABPROT", "INR" in the last 72 hours.  Past Medical History:  Diagnosis Date   Abrasion of upper arm with infection, left, sequela    started antibiotics lef arm oct 4th started antibiotics healing well   Anemia    hx of 2 yrs ago   Chicken pox    Chronic rhinitis    Contact  dermatitis and other eczema, due to unspecified cause    Crohn's 11-15-11   no issues in 2 yrs   Fibroids 11-15-11   remains with fibroids   Hernia    Incisional hernia, RLQ 10/02/2011   Infertility, female    Mononucleosis    Obesity    Pilonidal abscess    Rectal abscess    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[place])    Vitamin D deficiency     Past Surgical History:  Procedure Laterality Date   APPENDECTOMY  7/06   CESAREAN SECTION N/A 11/30/2020   Procedure: CESAREAN SECTION;  Surgeon: Everett Graff, MD;  Location: [facility] OR[facility];  Service: Obstetrics;  Laterality: N/A;   COLONOSCOPY WITH PROPOFOL N/A 03/07/2017   Procedure: COLONOSCOPY WITH PROPOFOL;  Surgeon: Milus Banister, MD;  Location: [facility] ENDOSCOPY;  Service: Endoscopy;  Laterality: N/A;   EYE SURGERY  11-15-11   2003-multiple stys   ileocecal resection  1/11   crohns disease with fisula/stricture. Dr. Ronnald Collum   INCISIONAL HERNIA REPAIR N/A 12/24/2018   Procedure: OPEN REPAIR VENTRAL INCISIONAL HERNIA WITH MESH PATCH AND PRIMARY REPAIR OF LEFT SPIGELIAN HERNIA;  Surgeon: Armandina Gemma, MD;  Location: [facility] ORS;  Service: General;  Laterality: N/A;   KNEE ARTHROSCOPY  99, 02   Bil.   MYOMECTOMY N/A 11/10/2014   Procedure: MYOMECTOMY;  Surgeon: Everett Graff, MD;  Location: [facility];  Service: Gynecology;  Laterality: N/A;   PILONIDAL CYST EXCISION  2011, 2012   I&Ds   VENTRAL HERNIA REPAIR  11/27/2011   Procedure: LAPAROSCOPIC VENTRAL HERNIA;  Surgeon: Adin Hector, MD;  Location: [facility];  Service: General;  Laterality: N/A;  Laparoscopic Exploration & Repair of Hernia in Abdomen   WISDOM TOOTH EXTRACTION      Family History  Problem Relation Age of Onset   Aneurysm Mother        brain   Heart disease Mother 65       heart anyrism   Healthy Father        fairly   Diabetes Sister        half sister   Bipolar disorder Sister        half sister   Colon cancer Maternal Grandmother    Stomach cancer Paternal Grandfather     Multiple sclerosis Sister     Prior to Admission medications   Medication Sig Start Date End Date Taking? Authorizing Provider  Calcium Carb-Cholecalciferol (CALCIUM/VITAMIN D) 600-400 MG-UNIT TABS Take 1 tablet by mouth daily.   Yes [provider]  fexofenadine (ALLEGRA) 180 MG tablet Take 180 mg by mouth daily.   Yes [provider]  fluticasone (FLONASE) 50 MCG/ACT nasal spray Place 1 spray into both nostrils daily as needed for allergies.   Yes [provider]  metFORMIN (GLUCOPHAGE) 500 MG tablet Take 1 tablet (500 mg total) by mouth 2 (two) times daily with a meal. 12/26/21  Yes Danford, Valetta Fuller D, NP  naproxen (NAPROSYN) 500 MG tablet  Take 500 mg by mouth 2 (two) times daily. 01/11/22  Yes [provider]  nystatin-triamcinolone ointment (MYCOLOG) Apply 1 Application topically 2 (two) times daily as needed (rash). 06/12/21  Yes [provider]  Prenatal Vit-Fe Fumarate-FA (PRENATAL MULTIVITAMIN) TABS tablet Take 1 tablet by mouth daily at 12 noon.   Yes [provider]  Vitamin D, Cholecalciferol, 50 MCG (2000 UT) CAPS Take 2,000 Units by mouth daily.   Yes [provider]  amoxicillin-clavulanate (AUGMENTIN) 875-125 MG tablet Take 1 tablet by mouth 2 (two) times daily. 01/11/22   [provider]    Current Facility-Administered Medications  Medication Dose Route Frequency Provider Last Rate Last Admin   0.9 %  sodium chloride infusion (Manually program via Guardrails IV Fluids)   Intravenous Once Barb Merino, MD       0.9 %  sodium chloride infusion  10 mL/hr Intravenous Once Marylyn Ishihara, Tyrone A, DO       acetaminophen (TYLENOL) tablet 650 mg  650 mg Oral Q6H PRN Marylyn Ishihara, Tyrone A, DO       Or   acetaminophen (TYLENOL) suppository 650 mg  650 mg Rectal Q6H PRN Marylyn Ishihara, Tyrone A, DO       calcium-vitamin D (OSCAL WITH D) 500-5 MG-MCG per tablet 1 tablet  1 tablet Oral QAC breakfast Kyle, Tyrone A, DO       fluticasone  (FLONASE) 50 MCG/ACT nasal spray 1 spray  1 spray Each Nare Daily PRN Marylyn Ishihara, Tyrone A, DO       loratadine (CLARITIN) tablet 10 mg  10 mg Oral Daily Kyle, Tyrone A, DO       ondansetron (ZOFRAN) tablet 4 mg  4 mg Oral Q6H PRN Marylyn Ishihara, Tyrone A, DO       Or   ondansetron (ZOFRAN) injection 4 mg  4 mg Intravenous Q6H PRN Marylyn Ishihara, Tyrone A, DO       pantoprozole (PROTONIX) 80 mg /NS 100 mL infusion  8 mg/hr Intravenous Continuous Kyle, Tyrone A, DO 10 mL/hr at 01/25/22 0343 8 mg/hr at 01/25/22 0343    Allergies as of 01/24/2022   (No Known Allergies)    Social History   Socioeconomic History   Marital status: Married    Spouse name: Hara Milholland   Number of children: Not on file   Years of education: Not on file   Highest education level: Bachelor's degree (e.g., BA, AB, BS)  Occupational History   Occupation: Music therapist  Tobacco Use   Smoking status: Never   Smokeless tobacco: Never  Vaping Use   Vaping Use: Never used  Substance and Sexual Activity   Alcohol use: No   Drug use: No   Sexual activity: Yes    Partners: Male  Other Topics Concern   Not on file  Social History Narrative   married   [place] of 2   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 math    No pets   Sleep 6-7 hours    BF with 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   Walking exercise   Eats balanced generally   Social Determinants of Health   Financial Resource Strain: Low Risk  (04/02/2021)   Overall Financial Resource Strain (CARDIA)    Difficulty of Paying Living Expenses: Not hard at all  Food Insecurity: No Food Insecurity (04/02/2021)   Hunger Vital Sign    Worried About Running Out of Food in the Last Year: Never true    Ran Out of Food in  the Last Year: Never true  Transportation Needs: No Transportation Needs (04/02/2021)   PRAPARE - Hydrologist (Medical): No    Lack of Transportation (Non-Medical): No  Physical Activity: Unknown (04/02/2021)   Exercise Vital Sign     Days of Exercise per Week: 0 days    Minutes of Exercise per Session: Not on file  Stress: No Stress Concern Present (04/02/2021)   [place]    Feeling of Stress : Not at all  Social Connections: [place] (04/02/2021)   Social Connection and Isolation Panel [NHANES]    Frequency of Communication with Friends and Family: More than three times a week    Frequency of Social Gatherings with Friends and Family: Patient refused    Attends Religious Services: More than 4 times per year    Active Member of [employer] or Organizations: Yes    Attends Archivist Meetings: 1 to 4 times per year    Marital Status: Married  Human resources officer Violence: Not on file    Review of Systems: All systems reviewed and negative except where noted in HPI.  Physical Exam: Vital signs in last 24 hours: Temp:  [97.4 F (36.3 C)-99 F (37.2 C)] 98.5 F (36.9 C) (08/31 0800) Pulse Rate:  [83-106] 96 (08/31 0800) Resp:  [16-31] 18 (08/31 0800) BP: (90-135)/(51-78) 111/52 (08/31 0800) SpO2:  [96 %-100 %] 98 % (08/31 0800) Weight:  [119.8 kg] 119.8 kg (08/30 1934) Last BM Date : 01/24/22  General:  Alert female in NAD Psych:  Pleasant, cooperative. Normal mood and affect Eyes: Pupils equal, no icterus. Conjunctive pink Ears:  Normal auditory acuity Nose: No deformity, discharge or lesions Neck:  Supple, no masses felt Lungs:  Clear to auscultation.  Heart:  Regular rate, regular rhythm. No lower extremity edema Abdomen:  Soft, nondistended, nontender, active bowel sounds, no masses felt Rectal :  Deferred Msk: Symmetrical without gross deformities.  Neurologic:  Alert, oriented, grossly normal neurologically Skin:  Intact without significant lesions.    Intake/Output from previous day: 08/30 0701 - 08/31 0700 In: 1491.2 [I.V.:80.9; Blood:315; IV Piggyback:1095.3] Out: 3350 [Urine:3350] Intake/Output this shift:  No  intake/output data recorded.    Principal Problem:   Symptomatic anemia Active Problems:   GIB (gastrointestinal bleeding)   History of Crohn's disease   Leukocytosis   Near syncope   Elevated d-dimer   Hypocalcemia    Tye Savoy, NP-C @  01/25/2022, 8:50 AM    Attending physician's note   I have taken history, reviewed the chart and examined the patient. I performed a substantive portion of this encounter, including complete performance of at least one of the key components, in conjunction with the APP. I agree with the Advanced Practitioner's note, impression and recommendations.   Melena s/o UGI bleed. Hb 12 (baseline) to 6.9 with syncope s/p 1U. Elevated BUN. Has been on Naproxen for ankle pain Crohn's ileocolitis s/p ileocecal resection 2011 on  infliximab [name] Q8weekly. Last colon 02/2017 with  few anastomotic erosions. Microcytic anemia d/t crohns  Plan: -IV  Protonix. -Trend CBC -EGD today. -Stop all NSAIDs -If neg EGD, CTA followed by colon (if needed)  Discussed risks and benefits   Carmell Austria, MD [facility] [phone]

## 2022-01-25 NOTE — Anesthesia Preprocedure Evaluation (Signed)
Anesthesia Evaluation  Patient identified by MRN, date of birth, ID band Patient awake    Reviewed: Allergy & Precautions, NPO status , Patient's Chart, lab work & pertinent test results  Airway Mallampati: I  TM Distance: >3 FB Neck ROM: Full    Dental no notable dental hx.    Pulmonary neg pulmonary ROS,    Pulmonary exam normal        Cardiovascular negative cardio ROS Normal cardiovascular exam     Neuro/Psych negative neurological ROS  negative psych ROS   GI/Hepatic Neg liver ROS,   Endo/Other  Morbid obesity  Renal/GU negative Renal ROS  negative genitourinary   Musculoskeletal negative musculoskeletal ROS (+)   Abdominal (+) + obese,   Peds  Hematology  (+) Blood dyscrasia, anemia ,   Anesthesia Other Findings   Reproductive/Obstetrics                             Anesthesia Physical Anesthesia Plan  ASA: 3  Anesthesia Plan: MAC   Post-op Pain Management:    Induction: Intravenous  PONV Risk Score and Plan: 2 and Propofol infusion, TIVA and Treatment may vary due to age or medical condition  Airway Management Planned: Natural Airway and Mask  Additional Equipment: None  Intra-op Plan:   Post-operative Plan:   Informed Consent: I have reviewed the patients History and Physical, chart, labs and discussed the procedure including the risks, benefits and alternatives for the proposed anesthesia with the patient or authorized representative who has indicated his/her understanding and acceptance.     Dental advisory given  Plan Discussed with: CRNA  Anesthesia Plan Comments:         Anesthesia Quick Evaluation

## 2022-01-25 NOTE — Progress Notes (Signed)
Date and time results received: 01/25/22 0710  Test: Hemoglobin Critical Value: 6.9  Name of Provider Notified: Dr. Sloan Leiter  Orders Received? Or Actions Taken?: Orders Received - See Orders for details Additional 1U of PRBC's being ordered.

## 2022-01-25 NOTE — Op Note (Signed)
[facility] Patient Name: [name] Procedure Date: 01/25/2022 MRN: [id number] Attending MD: Jackquline Denmark , MD Date of Birth: 1981-03-10 CSN: [id number] Age: 41 Admit Type: Inpatient Procedure:                Upper GI endoscopy Indications:              Melena in pt with Crohn's ileocolitis with                            decreased Hb from 12 to 6.9. H/O naproxen use. Providers:                Jackquline Denmark, MD, Mikey College, RN, Frazier Richards,                            Technician Referring MD:              Medicines:                Monitored Anesthesia Care Complications:            No immediate complications. Estimated Blood Loss:     Estimated blood loss: none. Procedure:                Pre-Anesthesia Assessment:                           - Prior to the procedure, a History and Physical                            was performed, and patient medications and                            allergies were reviewed. The patient's tolerance of                            previous anesthesia was also reviewed. The risks                            and benefits of the procedure and the sedation                            options and risks were discussed with the patient.                            All questions were answered, and informed consent                            was obtained. Prior Anticoagulants: The patient has                            taken no previous anticoagulant or antiplatelet                            agents. ASA Grade Assessment: II - A patient with  mild systemic disease. After reviewing the risks                            and benefits, the patient was deemed in                            satisfactory condition to undergo the procedure.                           After obtaining informed consent, the endoscope was                            passed under direct vision. Throughout the                            procedure, the  patient's blood pressure, pulse, and                            oxygen saturations were monitored continuously. The                            [id number]-H190 ([id number]) Olympus endoscope was introduced                            through the mouth, and advanced to the third part                            of duodenum. The upper GI endoscopy was                            accomplished without difficulty. The patient                            tolerated the procedure well. Scope In: Scope Out: Findings:      The examined esophagus was normal with well-defined Z-line at 35 cm.      One localized small superficial erosion with no bleeding and no stigmata       of recent bleeding was found in the gastric antrum. Biopsies were taken       with a cold forceps for histology.      The examined duodenum was normal. At least 3 passes were made.      The exam was otherwise without abnormality. No evidence of upper GI       bleeding. Impression:               - Small antral erosion                           - Otherwise normal EGD. No endoscopic evidence of                            UGI bleeding. Moderate Sedation:      Not Applicable - Patient had care per Anesthesia. Recommendation:           - Clear liquid diet.                           -  Continue present medications.                           - CTA today. If neg, consider colonoscopy in a.m.                           - Await pathology results.                           - The findings and recommendations were discussed                            with the patient's family. Procedure Code(s):        --- Professional ---                           [phone], Esophagogastroduodenoscopy, flexible,                            transoral; with biopsy, single or multiple Diagnosis Code(s):        --- Professional ---                           K31.89, Other diseases of stomach and duodenum                           K92.1, Melena (includes Hematochezia) CPT  copyright 2019 American Medical Association. All rights reserved. The codes documented in this report are preliminary and upon coder review may  be revised to meet current compliance requirements. Jackquline Denmark, MD 01/25/2022 11:55:06 AM This report has been signed electronically. Number of Addenda: 0

## 2022-01-26 ENCOUNTER — Inpatient Hospital Stay (HOSPITAL_COMMUNITY): Payer: BC Managed Care – PPO | Admitting: Anesthesiology

## 2022-01-26 ENCOUNTER — Encounter (HOSPITAL_COMMUNITY): Payer: Self-pay | Admitting: Internal Medicine

## 2022-01-26 ENCOUNTER — Encounter (HOSPITAL_COMMUNITY)
Admission: EM | Disposition: A | Discharge status: Home / Self Care | Payer: Self-pay | Source: Home / Self Care | Attending: Internal Medicine

## 2022-01-26 DIAGNOSIS — K633 Ulcer of intestine: Secondary | ICD-10-CM

## 2022-01-26 DIAGNOSIS — D649 Anemia, unspecified: Secondary | ICD-10-CM | POA: Diagnosis not present

## 2022-01-26 DIAGNOSIS — K625 Hemorrhage of anus and rectum: Secondary | ICD-10-CM | POA: Diagnosis not present

## 2022-01-26 HISTORY — PX: COLONOSCOPY WITH PROPOFOL: SHX5780

## 2022-01-26 HISTORY — PX: BIOPSY: SHX5522

## 2022-01-26 LAB — BPAM RBC
Blood Product Expiration Date: 202309222359
Blood Product Expiration Date: 202309272359
ISSUE DATE / TIME: 202308302011
ISSUE DATE / TIME: 202308310950
Unit Type and Rh: 5100
Unit Type and Rh: 5100

## 2022-01-26 LAB — CBC WITH DIFFERENTIAL/PLATELET
Abs Immature Granulocytes: 0.23 10*3/uL — ABNORMAL HIGH (ref 0.00–0.07)
Basophils Absolute: 0.1 10*3/uL (ref 0.0–0.1)
Basophils Relative: 0 %
Eosinophils Absolute: 0.1 10*3/uL (ref 0.0–0.5)
Eosinophils Relative: 1 %
HCT: 31.9 % — ABNORMAL LOW (ref 36.0–46.0)
Hemoglobin: 9.8 g/dL — ABNORMAL LOW (ref 12.0–15.0)
Immature Granulocytes: 1 %
Lymphocytes Relative: 19 %
Lymphs Abs: 3.7 10*3/uL (ref 0.7–4.0)
MCH: 25.7 pg — ABNORMAL LOW (ref 26.0–34.0)
MCHC: 30.7 g/dL (ref 30.0–36.0)
MCV: 83.7 fL (ref 80.0–100.0)
Monocytes Absolute: 2.3 10*3/uL — ABNORMAL HIGH (ref 0.1–1.0)
Monocytes Relative: 12 %
Neutro Abs: 12.9 10*3/uL — ABNORMAL HIGH (ref 1.7–7.7)
Neutrophils Relative %: 67 %
Platelets: 307 10*3/uL (ref 150–400)
RBC: 3.81 MIL/uL — ABNORMAL LOW (ref 3.87–5.11)
RDW: 19 % — ABNORMAL HIGH (ref 11.5–15.5)
WBC: 19.4 10*3/uL — ABNORMAL HIGH (ref 4.0–10.5)
nRBC: 0.3 % — ABNORMAL HIGH (ref 0.0–0.2)

## 2022-01-26 LAB — TYPE AND SCREEN
ABO/RH(D): O POS
Antibody Screen: NEGATIVE
Unit division: 0
Unit division: 0

## 2022-01-26 LAB — PTH, INTACT AND CALCIUM
Calcium, Total (PTH): 7.6 mg/dL — ABNORMAL LOW (ref 8.7–10.2)
PTH: 22 pg/mL (ref 15–65)

## 2022-01-26 SURGERY — COLONOSCOPY WITH PROPOFOL
Anesthesia: Monitor Anesthesia Care

## 2022-01-26 MED ORDER — LIDOCAINE 2% (20 MG/ML) 5 ML SYRINGE
INTRAMUSCULAR | Status: DC | PRN
  Administered 2022-01-26: 100 mg via INTRAVENOUS

## 2022-01-26 MED ORDER — PROPOFOL 500 MG/50ML IV EMUL
INTRAVENOUS | Status: DC | PRN
  Administered 2022-01-26: 150 ug/kg/min via INTRAVENOUS

## 2022-01-26 MED ORDER — PEG-KCL-NACL-NASULF-NA ASC-C 100 G PO SOLR
0.5000 | Freq: Once | ORAL | Status: AC
  Administered 2022-01-26: 100 g via ORAL
  Filled 2022-01-26: qty 1

## 2022-01-26 MED ORDER — SODIUM CHLORIDE 0.9 % IV SOLN: INTRAVENOUS | Status: DC

## 2022-01-26 MED ORDER — PEG-KCL-NACL-NASULF-NA ASC-C 100 G PO SOLR: 1.0000 | Freq: Once | ORAL | Status: DC

## 2022-01-26 MED ORDER — HYDROCODONE-ACETAMINOPHEN 5-325 MG PO TABS
1.0000 | ORAL_TABLET | Freq: Once | ORAL | Status: AC
  Administered 2022-01-26: 1 via ORAL
  Filled 2022-01-26: qty 1

## 2022-01-26 MED ORDER — LIDOCAINE 5 % EX PTCH
1.0000 | MEDICATED_PATCH | CUTANEOUS | Status: DC
  Administered 2022-01-26 – 2022-01-29 (×4): 1 via TRANSDERMAL
  Filled 2022-01-26 (×4): qty 1

## 2022-01-26 MED ORDER — HYDROCODONE-ACETAMINOPHEN 5-325 MG PO TABS
1.0000 | ORAL_TABLET | Freq: Four times a day (QID) | ORAL | Status: DC | PRN
  Administered 2022-01-26 – 2022-01-28 (×6): 1 via ORAL
  Filled 2022-01-26 (×6): qty 1

## 2022-01-26 MED ORDER — LACTATED RINGERS IV SOLN
INTRAVENOUS | Status: DC
  Administered 2022-01-26: 1000 mL via INTRAVENOUS

## 2022-01-26 SURGICAL SUPPLY — 22 items

## 2022-01-26 NOTE — Progress Notes (Signed)
PROGRESS NOTE  [name] OZH:[id number] DOB: [birth year] DOA: 01/24/2022 PCP: Burnis Medin, MD  HPI/Recap of past 100 hours:  41 year old female with history of stable Crohn's disease not on treatment presented to the emergency room with near syncopal episode.  Recently suffered from sinusitis and was prescribed amoxicillin.  No use of NSAIDs.  In the emergency room hemodynamically stable, hemoglobin was 7.2 with recent hemoglobin of 12.  Hemoccult positive.  Admitted with GI consultation.  Post EGD on 01/25/2022 showing antral erosion.  Plan for colonoscopy on 01/26/2022.  01/26/2022: The patient was seen and examined at bedside.  She denies having any abdominal pain at time of this exam.  Mainly, pain is in her right knee from moving.  No reported melena or hematochezia.    Assessment/Plan: Principal Problem:   Symptomatic anemia Active Problems:   GIB (gastrointestinal bleeding)   History of Crohn's disease   Leukocytosis   Near syncope   Elevated d-dimer   Hypocalcemia  Acute blood loss anemia, symptomatic anemia With dizziness lightheadedness, FOBT positive. Initially hemoglobin 6.9, received 2 units PRBC, repeat hemoglobin this morning 9.9. On Protonix drip Post EGD showing antral erosion. Completed her bowel prep last night, plan for colonoscopy this afternoon. Repeat CBC in the morning   Crohn's disease: Denies abdominal pain..   Leukocytosis: Likely reactive.  Stable.  Afebrile.  Severe morbid obesity BMI 46 Recommend weight loss outpatient with regular physical activity and healthy dieting.  Right knee pain X-ray showing severe osteoarthritis Symptomatic management with as needed analgesics   DVT prophylaxis: SCDs Start: 01/24/22 1849     Code Status: Full Family Communication: None at bedside. Disposition Plan: Status is: Inpatient Remains inpatient appropriate because: Blood transfusions, inpatient procedures       Consultants:   Gastroenterology   Procedures:  EGD  Colonoscopy   Antimicrobials:  None      Status is: Inpatient The patient requires at least 2 midnights for further evaluation and treatment of present condition.    Objective: Vitals:   01/26/22 0023 01/26/22 0551 01/26/22 1331 01/26/22 1437  BP: (!) 152/103 (!) 91/56 (!) 139/48 (!) 138/47  Pulse: 94 100 86 (!) 103  Resp: 18 18 (!) 22 (!) 28  Temp: 98.1 F (36.7 C) 98.2 F (36.8 C) 97.9 F (36.6 C) 98.1 F (36.7 C)  TempSrc: Oral Oral Temporal Temporal  SpO2: 100% 100% 100% 100%  Weight:   120.2 kg   Height:   5' 3"  (1.6 m)     Intake/Output Summary (Last 24 hours) at 01/26/2022 1443 Last data filed at 01/26/2022 1438 Gross per 24 hour  Intake 1455.92 ml  Output 2250 ml  Net -794.08 ml   Filed Weights   01/24/22 1934 01/25/22 1106 01/26/22 1331  Weight: 119.8 kg 119.8 kg 120.2 kg    Exam:  General: 41 y.o. year-old female well developed well nourished in no acute distress.  Alert and oriented x3. Cardiovascular: Regular rate and rhythm with no rubs or gallops.  No thyromegaly or JVD noted.   Respiratory: Clear to auscultation with no wheezes or rales. Good inspiratory effort. Abdomen: Soft nontender nondistended with normal bowel sounds x4 quadrants. Musculoskeletal: Trace lower extremity edema bilaterally.   Skin: No ulcerative lesions noted or rashes, Psychiatry: Mood is appropriate for condition and setting   Data Reviewed: CBC: Recent Labs  Lab 01/24/22 1223 01/25/22 0632 01/25/22 1615 01/26/22 1025  WBC 20.5* 16.9* 17.6* 19.4*  NEUTROABS  --   --  11.2*  12.9*  HGB 7.2* 6.9* 8.5* 9.8*  HCT 23.5* 22.0* 27.0* 31.9*  MCV 78.9* 78.6* 82.6 83.7  PLT 344 277 263 027   Basic Metabolic Panel: Recent Labs  Lab 01/24/22 1223 01/24/22 1238 01/25/22 0328  NA 134*  --  136  K 3.7  --  3.8  CL 106  --  108  CO2 22  --  23  GLUCOSE 132*  --  97  BUN 39*  --  25*  CREATININE 0.56  --  0.63  CALCIUM 7.6*  --   7.4*  MG  --  1.8  --    GFR: Estimated Creatinine Clearance: 119.7 mL/min (by C-G formula based on SCr of 0.63 mg/dL). Liver Function Tests: Recent Labs  Lab 01/25/22 0328  AST 14*  ALT 12  ALKPHOS 42  BILITOT 0.4  PROT 5.2*  ALBUMIN 2.3*  2.3*   No results for input(s): "LIPASE", "AMYLASE" in the last 168 hours. No results for input(s): "AMMONIA" in the last 168 hours. Coagulation Profile: No results for input(s): "INR", "PROTIME" in the last 168 hours. Cardiac Enzymes: Recent Labs  Lab 01/24/22 1238  CKTOTAL 267*   BNP (last 3 results) No results for input(s): "PROBNP" in the last 8760 hours. HbA1C: No results for input(s): "HGBA1C" in the last 72 hours. CBG: Recent Labs  Lab 01/24/22 1214  GLUCAP 125*   Lipid Profile: No results for input(s): "CHOL", "HDL", "LDLCALC", "TRIG", "CHOLHDL", "LDLDIRECT" in the last 72 hours. Thyroid Function Tests: No results for input(s): "TSH", "T4TOTAL", "FREET4", "T3FREE", "THYROIDAB" in the last 72 hours. Anemia Panel: Recent Labs    01/25/22 0328  TIBC 269  IRON 153   Urine analysis:    Component Value Date/Time   COLORURINE STRAW (A) 01/24/2022 1221   APPEARANCEUR CLEAR 01/24/2022 1221   LABSPEC 1.014 01/24/2022 1221   PHURINE 5.0 01/24/2022 1221   GLUCOSEU NEGATIVE 01/24/2022 [place] 04/08/2019 1645   HGBUR NEGATIVE 01/24/2022 1221   HGBUR 3+ 04/07/2010 0841   BILIRUBINUR NEGATIVE 01/24/2022 1221   BILIRUBINUR n 04/07/2014 1509   KETONESUR NEGATIVE 01/24/2022 1221   PROTEINUR NEGATIVE 01/24/2022 1221   UROBILINOGEN 0.2 04/08/2019 1645   NITRITE NEGATIVE 01/24/2022 1221   LEUKOCYTESUR NEGATIVE 01/24/2022 1221   Sepsis Labs: @LABRCNTIP (procalcitonin:4,lacticidven:4)  ) Recent Results (from the past 240 hour(s))  SARS Coronavirus 2 by RT PCR (hospital order, performed in [employer] hospital lab) *cepheid single result test* Anterior Nasal Swab     Status: None   Collection Time: 01/24/22  12:50 PM   Specimen: Anterior Nasal Swab  Result Value Ref Range Status   SARS Coronavirus 2 by RT PCR NEGATIVE NEGATIVE Final    Comment: (NOTE) SARS-CoV-2 target nucleic acids are NOT DETECTED.  The SARS-CoV-2 RNA is generally detectable in upper and lower respiratory specimens during the acute phase of infection. The lowest concentration of SARS-CoV-2 viral copies this assay can detect is 250 copies / mL. A negative result does not preclude SARS-CoV-2 infection and should not be used as the sole basis for treatment or other patient management decisions.  A negative result may occur with improper specimen collection / handling, submission of specimen other than nasopharyngeal swab, presence of viral mutation(s) within the areas targeted by this assay, and inadequate number of viral copies (<250 copies / mL). A negative result must be combined with clinical observations, patient history, and epidemiological information.  Fact Sheet for Patients:   https://www.patel.info/  Fact Sheet for Healthcare Providers:  https://.com/  This test is not yet approved or  cleared by the [place] and has been authorized for detection and/or diagnosis of SARS-CoV-2 by FDA under an Emergency Use Authorization (EUA).  This EUA will remain in effect (meaning this test can be used) for the duration of the COVID-19 declaration under Section 564(b)(1) of the Act, 21 U.S.C. section 360bbb-3(b)(1), unless the authorization is terminated or revoked sooner.  Performed at [facility], [place] [address], [place], [place] [address]       Studies: DG Knee 1-2 Views Right  Result Date: 01/25/2022 CLINICAL DATA:  Right knee pain for 1-2 days.  No known injury. EXAM: RIGHT KNEE - 1-2 VIEW COMPARISON:  None Available. FINDINGS: Severe patellofemoral joint space narrowing with moderate peripheral degenerative osteophytes. Small joint  effusion. There is a 2.3 cm mineralized loose body just lateral to the superior aspect of the lateral femoral condyle, likely within the lateral patellofemoral joint space. Moderate peripheral lateral femoral condyle and mild peripheral lateral tibial plateau degenerative osteophytes. The medial and lateral compartment joint spaces are maintained. No acute fracture or dislocation. IMPRESSION: 1. Severe patellofemoral osteoarthritis. 2. Superolateral knee loose body measuring up to 2.3 cm. Electronically Signed   By: Yvonne Kendall M.D.   On: 01/25/2022 17:50   DG Ankle 2 Views Left  Result Date: 01/25/2022 CLINICAL DATA:  Left ankle pain for 1-2 days. EXAM: LEFT ANKLE - 2 VIEW COMPARISON:  None Available. FINDINGS: Normal bone mineralization. The ankle mortise is symmetric and intact. Minimal distal medial malleolar degenerative spurring. No acute fracture is seen. No dislocation. Joint spaces are preserved. IMPRESSION: No significant osteoarthritis.  No acute fracture. Electronically Signed   By: Yvonne Kendall M.D.   On: 01/25/2022 17:49   CT Angio Abd/Pel w/ and/or w/o  Result Date: 01/25/2022 CLINICAL DATA:  Concern for GI bleeding. History of ileal cecal Crohn's disease. EXAM: CT ANGIOGRAPHY ABDOMEN AND PELVIS WITH CONTRAST AND WITHOUT CONTRAST TECHNIQUE: Multidetector CT imaging of the abdomen and pelvis was performed using the standard protocol during bolus administration of intravenous contrast. Multiplanar reconstructed images and MIPs were obtained and reviewed to evaluate the vascular anatomy. RADIATION DOSE REDUCTION: This exam was performed according to the departmental dose-optimization program which includes automated exposure control, adjustment of the mA and/or kV according to patient size and/or use of iterative reconstruction technique. CONTRAST:  148m OMNIPAQUE IOHEXOL 350 MG/ML SOLN COMPARISON:  08/25/2018 FINDINGS: VASCULAR Aorta: Intact aorta. No acute aortic process, aneurysm,  dissection, or surrounding inflammatory change. No retroperitoneal hemorrhage or hematoma. Celiac: Widely patent origin including its branches. No active bleeding within the GI tract throughout the celiac territory. SMA: Widely patent origin including its central mesenteric branches. No active arterial GI bleeding in the SMA vascular territory. Renals: Single renal arteries appear widely patent. IMA: IMA origin remains patent off the distal aorta including its branches. No active arterial bleeding in the IMA vascular territory. Inflow: Patent without evidence of aneurysm, dissection, vasculitis or significant stenosis. Proximal Outflow: Bilateral common femoral and visualized portions of the superficial and profunda femoral arteries are patent without evidence of aneurysm, dissection, vasculitis or significant stenosis. Veins: Venous phase imaging not performed Review of the MIP images confirms the above findings. NON-VASCULAR Lower chest: Scattered bibasilar atelectasis. No pericardial pleural effusion. Prominent heart size. Hepatobiliary: No focal liver abnormality is seen. No gallstones, gallbladder wall thickening, or biliary dilatation. Pancreas: Unremarkable. No pancreatic ductal dilatation or surrounding inflammatory changes. Spleen: Normal in size without focal  abnormality. Adrenals/Urinary Tract: Adrenal glands are unremarkable. Kidneys are normal, without renal calculi, focal lesion, or hydronephrosis. Bladder is collapsed by Foley catheter. Stomach/Bowel: Negative for bowel obstruction, significant dilatation, ileus, or free air. Small lateral right lower quadrant abdominal wall hernia containing a fluid distended loop of small bowel but without obstruction or incarceration. Similar appearance. Postop changes of the cecum again noted. No free fluid, fluid collection, hemorrhage, hematoma, abscess or ascites. Lymphatic: No bulky adenopathy. Reproductive: Uterine fibroids again noted. No adnexal  abnormality. No free fluid, fluid collection, or hemorrhage in the pelvis. Other: Midline fat containing ventral hernia above the umbilicus. This is slightly enlarged. Previous left lower quadrant spigelian abdominal wall hernia no longer evident, suspect surgical repair. Musculoskeletal: No acute or significant osseous finding. IMPRESSION: VASCULAR Negative for acute arterial GI bleeding in the abdomen or pelvis. No other acute vascular process. NON-VASCULAR No other acute intra-abdominal or pelvic finding by CTA. Slight enlargement of the midline fat containing ventral hernia. Stable chronic and postoperative findings as above. Electronically Signed   By: Jerilynn Mages.  Shick M.D.   On: 01/25/2022 15:22    Scheduled Meds:  [MAR Hold] calcium-vitamin D  1 tablet Oral QAC breakfast   [MAR Hold] Chlorhexidine Gluconate Cloth  6 each Topical Q0600   [MAR Hold] lidocaine  1 patch Transdermal Q24H   [MAR Hold] loratadine  10 mg Oral Daily    Continuous Infusions:  [MAR Hold] sodium chloride     sodium chloride     lactated ringers Stopped (01/26/22 1438)   pantoprazole 8 mg/hr (01/26/22 1157)     LOS: 2 days     Kayleen Memos, MD [facility] Pager [phone]  If 7PM-7AM, please contact night-coverage [email or web address] Password [facility] 01/26/2022, 2:43 PM

## 2022-01-26 NOTE — Transfer of Care (Signed)
Immediate Anesthesia Transfer of Care Note  Patient: Nirel Babler  Procedure(s) Performed: COLONOSCOPY WITH PROPOFOL BIOPSY  Patient Location: Endoscopy Unit  Anesthesia Type:MAC  Level of Consciousness: drowsy  Airway & Oxygen Therapy: Patient Spontanous Breathing and Patient connected to face mask oxygen  Post-op Assessment: Report given to RN and Post -op Vital signs reviewed and stable  Post vital signs: Reviewed and stable  Last Vitals:  Vitals Value Taken Time  BP 138/47 01/26/22 1437  Temp    Pulse 103 01/26/22 1438  Resp 28 01/26/22 1438  SpO2 100 % 01/26/22 1438  Vitals shown include unvalidated device data.  Last Pain:  Vitals:   01/26/22 1331  TempSrc: Temporal  PainSc: 4       Patients Stated Pain Goal: 2 (03/27/58 4585)  Complications: No notable events documented.

## 2022-01-26 NOTE — Op Note (Signed)
[facility] Patient Name: [name] Procedure Date: 01/26/2022 MRN: [id number] Attending MD: Jackquline Denmark , MD Date of Birth: 1981-04-23 CSN: [id number] Age: 41 Admit Type: Outpatient Procedure:                Colonoscopy Indications:              LGI bleed. Crohn's ileocolitis s/p ileocecal                            resection 2011 on infliximab [name] Q8weekly.                            Neg EGD, CTA Providers:                Jackquline Denmark, MD, [name] Reichert, RN, Gloris Ham, Technician Referring MD:              Medicines:                Monitored Anesthesia Care Complications:            No immediate complications. Estimated Blood Loss:     Estimated blood loss: none. Procedure:                Pre-Anesthesia Assessment:                           - Prior to the procedure, a History and Physical                            was performed, and patient medications and                            allergies were reviewed. The patient's tolerance of                            previous anesthesia was also reviewed. The risks                            and benefits of the procedure and the sedation                            options and risks were discussed with the patient.                            All questions were answered, and informed consent                            was obtained. Prior Anticoagulants: The patient has                            taken no previous anticoagulant or antiplatelet                            agents. ASA Grade Assessment: II -  A patient with                            mild systemic disease. After reviewing the risks                            and benefits, the patient was deemed in                            satisfactory condition to undergo the procedure.                           After obtaining informed consent, the colonoscope                            was passed under direct vision. Throughout the                             procedure, the patient's blood pressure, pulse, and                            oxygen saturations were monitored continuously. The                            [id number]-HQ190L ([id number]) Olympus colonoscope was                            introduced through the anus and advanced to the the                            ileocolonic anastomosis. The colonoscopy was                            performed without difficulty. The patient tolerated                            the procedure well. The quality of the bowel                            preparation was good. The terminal ileum, ileocecal                            valve, appendiceal orifice, and rectum were                            photographed. Scope In: 2:10:21 PM Scope Out: 2:31:37 PM Scope Withdrawal Time: 0 hours 15 minutes 40 seconds  Total Procedure Duration: 0 hours 21 minutes 16 seconds  Findings:      There was evidence of a prior end-to-side ileo-colonic anastomosis in       the cecum with multiple ulcers. The largest ulcer measured 1.2 cm. No       active bleeding. There was TI stenosis. The pediatric colonoscopy was       barely passed beyond. Multiple superficial ulcers were noted in the  neo-TI. Biopsies were taken with a cold forceps for histology.      Non-bleeding internal hemorrhoids were found during retroflexion. The       hemorrhoids were small.      The exam was otherwise without abnormality on direct and retroflexion       views.      The Simple Endoscopic Score for Crohn's Disease was determined based on       the endoscopic appearance of the mucosa in the following segments:      - Neo-Ileum/neo-cecum: Findings include large ulcers 0.5-2 cm in size,       greater than 30% ulcerated surfaces, greater than 75% of surfaces       affected and a single narrowing that can be passed. Segment score: 9.      - Remaining colon: Findings include no ulcers present, no ulcerated       surfaces, no  affected surfaces and no narrowings. Segment score: 0.      - Total SES-CD aggregate score: 9. Impression:               - Multiple anastomotic ulcers s/o Crohn's                            recurrence. Biopsied. The ulcers are likely the                            etiology of recent bleeding in setting of naproxen                           - Non-bleeding internal hemorrhoids.                           - The examination was otherwise normal on direct                            and retroflexion views. Moderate Sedation:      Not Applicable - Patient had care per Anesthesia. Recommendation:           - Patient has a contact number available for                            emergencies. The signs and symptoms of potential                            delayed complications were discussed with the                            patient. Return to normal activities tomorrow.                            Written discharge instructions were provided to the                            patient.                           - Resume previous diet.                           -  Continue present medications.                           - No aspirin, ibuprofen, naproxen, or other                            non-steroidal anti-inflammatory drugs.                           - Await pathology results.                           - Use prednisone 40 mg PO once a day x 2 weeks,                            then 38m po qd x 2weeks, then 220mpo qd x 2                            weeks, then 1064mo QD x 2 weeks, then stop.                           - FU with PauNevin Bloodgood 4 weeks.                           - Consider switching infliximab to Stelara or                            Entyvio or Rinvoq at follow-up. For now please                            continue.                           - The findings and recommendations were discussed                            with the patient's family. Procedure Code(s):        --- Professional ---                            [id number], flexible; with biopsy, single                            or multiple Diagnosis Code(s):        --- Professional ---                           Z98.0, Intestinal bypass and anastomosis status                           K64.8, Other hemorrhoids                           K62.5, Hemorrhage of anus and rectum CPT copyright 2019 American Medical Association. All rights reserved. The codes documented in this report are preliminary and  upon coder review may  be revised to meet current compliance requirements. Jackquline Denmark, MD 01/26/2022 2:52:59 PM This report has been signed electronically. Number of Addenda: 0

## 2022-01-26 NOTE — Interval H&P Note (Signed)
History and Physical Interval Note:  01/26/2022 1:25 PM  [name]  has presented today for surgery, with the diagnosis of Gastrointestinal bleeding, anemia. Crohn's disease..  The various methods of treatment have been discussed with the patient and family. After consideration of risks, benefits and other options for treatment, the patient has consented to  Procedure(s): COLONOSCOPY WITH PROPOFOL (N/A) COLONOSCOPY (N/A) as a surgical intervention.  The patient's history has been reviewed, patient examined, no change in status, stable for surgery.  I have reviewed the patient's chart and labs.  Questions were answered to the patient's satisfaction.     Jackquline Denmark

## 2022-01-26 NOTE — Anesthesia Preprocedure Evaluation (Addendum)
Anesthesia Evaluation  Patient identified by MRN, date of birth, ID band Patient awake    Reviewed: Allergy & Precautions, NPO status , Patient's Chart, lab work & pertinent test results  Airway Mallampati: II  TM Distance: >3 FB Neck ROM: Full    Dental no notable dental hx.    Pulmonary neg pulmonary ROS,    Pulmonary exam normal        Cardiovascular negative cardio ROS Normal cardiovascular exam     Neuro/Psych negative neurological ROS  negative psych ROS   GI/Hepatic negative GI ROS, Neg liver ROS,   Endo/Other  Morbid obesity  Renal/GU negative Renal ROS     Musculoskeletal negative musculoskeletal ROS (+)   Abdominal (+) + obese,   Peds  Hematology  (+) Blood dyscrasia, anemia ,   Anesthesia Other Findings Gastrointestinal bleeding Anemia Crohn's disease  Reproductive/Obstetrics                            Anesthesia Physical Anesthesia Plan  ASA: 3  Anesthesia Plan: MAC   Post-op Pain Management:    Induction: Intravenous  PONV Risk Score and Plan: 2 and Propofol infusion and Treatment may vary due to age or medical condition  Airway Management Planned: Simple Face Mask  Additional Equipment:   Intra-op Plan:   Post-operative Plan:   Informed Consent: I have reviewed the patients History and Physical, chart, labs and discussed the procedure including the risks, benefits and alternatives for the proposed anesthesia with the patient or authorized representative who has indicated his/her understanding and acceptance.     Dental advisory given  Plan Discussed with: CRNA  Anesthesia Plan Comments:        Anesthesia Quick Evaluation

## 2022-01-26 NOTE — Plan of Care (Signed)
Problem: Education: Goal: Knowledge of General Education information will improve Description: Including pain rating scale, medication(s)/side effects and non-pharmacologic comfort measures Outcome: Progressing   Problem: Clinical Measurements: Goal: Ability to maintain clinical measurements within normal limits will improve Outcome: Progressing   Problem: Elimination: Goal: Will not experience complications related to bowel motility Outcome: Progressing   Problem: Pain Managment: Goal: General experience of comfort will improve Outcome: [place], RN 01/26/22 10:23 AM

## 2022-01-26 NOTE — Progress Notes (Signed)
.   Transition of Care [facility]) Screening Note   Patient Details  Name: Keeleigh Terris Date of Birth: [birth year]   Transition of [facility]) CM/SW Contact:    Illene Regulus, LCSW Phone Number: 01/26/2022, 3:26 PM    Transition of Care Department [facility]) has reviewed patient and no TOC needs have been identified at this time. We will continue to monitor patient advancement through interdisciplinary progression rounds. If new patient transition needs arise, please place a TOC consult.

## 2022-01-27 ENCOUNTER — Inpatient Hospital Stay (HOSPITAL_COMMUNITY): Payer: BC Managed Care – PPO

## 2022-01-27 DIAGNOSIS — D649 Anemia, unspecified: Secondary | ICD-10-CM | POA: Diagnosis not present

## 2022-01-27 LAB — CBC
HCT: 25.5 % — ABNORMAL LOW (ref 36.0–46.0)
Hemoglobin: 7.7 g/dL — ABNORMAL LOW (ref 12.0–15.0)
MCH: 25.8 pg — ABNORMAL LOW (ref 26.0–34.0)
MCHC: 30.2 g/dL (ref 30.0–36.0)
MCV: 85.6 fL (ref 80.0–100.0)
Platelets: 271 10*3/uL (ref 150–400)
RBC: 2.98 MIL/uL — ABNORMAL LOW (ref 3.87–5.11)
RDW: 19.6 % — ABNORMAL HIGH (ref 11.5–15.5)
WBC: 16.5 10*3/uL — ABNORMAL HIGH (ref 4.0–10.5)
nRBC: 0.2 % (ref 0.0–0.2)

## 2022-01-27 LAB — SYNOVIAL CELL COUNT + DIFF, W/ CRYSTALS
Crystals, Fluid: NONE SEEN
Eosinophils-Synovial: 0 % (ref 0–1)
Lymphocytes-Synovial Fld: 2 % (ref 0–20)
Monocyte-Macrophage-Synovial Fluid: 12 % — ABNORMAL LOW (ref 50–90)
Neutrophil, Synovial: 86 % — ABNORMAL HIGH (ref 0–25)
WBC, Synovial: 7820 /mm3 — ABNORMAL HIGH (ref 0–200)

## 2022-01-27 MED ORDER — DIPHENHYDRAMINE HCL 50 MG/ML IJ SOLN: 25.0000 mg | Freq: Once | INTRAMUSCULAR | Status: DC

## 2022-01-27 MED ORDER — PANTOPRAZOLE SODIUM 40 MG PO TBEC
40.0000 mg | DELAYED_RELEASE_TABLET | Freq: Every day | ORAL | Status: DC
  Administered 2022-01-28 – 2022-01-29 (×2): 40 mg via ORAL
  Filled 2022-01-27 (×2): qty 1

## 2022-01-27 MED ORDER — PREDNISONE 20 MG PO TABS
30.0000 mg | ORAL_TABLET | Freq: Every day | ORAL | Status: DC
Start: 2022-02-11 — End: 2022-01-28

## 2022-01-27 MED ORDER — PREDNISONE 20 MG PO TABS: 40.0000 mg | ORAL_TABLET | Freq: Every day | ORAL | Status: DC

## 2022-01-27 MED ORDER — PREDNISONE 20 MG PO TABS: 20.0000 mg | ORAL_TABLET | Freq: Every day | ORAL | Status: DC

## 2022-01-27 MED ORDER — PREDNISONE 5 MG PO TABS
10.0000 mg | ORAL_TABLET | Freq: Every day | ORAL | Status: DC
Start: 2022-03-11 — End: 2022-01-28

## 2022-01-27 NOTE — Progress Notes (Signed)
Orthopaedics consulted for R knee pain and swelling.  Pt reported recent fall due to anemia but no immediate pain after fall, but rather apparently atraumatic onset of pain, swelling, decreased motion, and inability to bear weight some time after.  On exam pt unable to tolerate minimal ROM and unable to maintain SLR against resistance.  Diffuse, non-localizing tenderness over entire knee.  Radiographs of R knee demonstrate fairly advanced degenerative patellofemoral arthritis, as well as likely loose body projecting in region of lateral gutter.  While PFJ OA may certainly cause pain and effusion, it is unlikely to cause such a massive effusion and such apparently debilitating pain with such rapid onset.  She reports no previous episodes of anterior knee pain or symptoms related to PFJ OA.  Knee aspiration performed and not concerning for infection with cell count around 7k.  Given complete lack of extensor mechanism function on examination, MRI was obtained.  Currently no report, but independent review reveals completely intact and normal extensor mechanism with redemonstration of PFJ OA, as well as intra-articular loose body in the lateral gutter, which is an unlikely source of anterior and medial pain.  There is an effusion and synovitis.  No orthopaedic interventions indicated at this time.  In this patient with IBD, an autoimmune inflammatory etiology may be a factor.  Consider Rheumatology consultation and workup.  No orthopaedic follow-up is required after discharge.    Additional recommendations: PT/OT WBAT/ROMAT RLE NSAIDs as medically appropriate Remainder of care per primary team  Please re-consult orthopaedics for new/future orthopaedic concerns.  Georgeanna Harrison M.D. Orthopaedic Surgery [facility]

## 2022-01-27 NOTE — Progress Notes (Signed)
Patient discussed with hospitalist.  Imaging reviewed--2 view R knee XR no sunrise view.  R knee pain, possibly post-traumatic after fall.  No concern for septic arthritis per hospitalist.  Ordered repeat XR to include evaluation of PF joint.  Will evaluate after imaging.  Full consult to follow.  Georgeanna Harrison M.D. Orthopaedic Surgery [facility]

## 2022-01-27 NOTE — Progress Notes (Signed)
PT Cancellation Note  Patient Details Name: [name] MRN: [id number] DOB: 24-Sep-1980   Cancelled Treatment:    Reason Eval/Treat Not Completed: Patient at procedure or test/unavailable. IV team nurse in room with patient.  Will check back as schedule permits.    [name] 01/27/2022, 3:18 PM

## 2022-01-27 NOTE — Progress Notes (Signed)
R knee aspiration performed at 1444, with fluid sent for urgent analysis.  As of 4:04 PM there are no updated results, all fluid labs remain pending.  Georgeanna Harrison M.D. Orthopaedic Surgery [facility]

## 2022-01-27 NOTE — Progress Notes (Signed)
PROGRESS NOTE  [name] TKZ:[id number] DOB: Dec 24, 1980 DOA: 01/24/2022 PCP: Burnis Medin, MD  HPI/Recap of past 35 hours: 41 year old female with history of Crohn's disease not on treatment who presented to the emergency room with near syncopal episode.  On NSAIDs, naproxen with presenting hemoglobin of 6.9, recent hemoglobin was 12.  Hemoccult positive.  Seen by GI, post EGD on 01/25/2022 by Dr. Lyndel Safe, [facility]o Sevin, which showed- Small antral erosion- Otherwise normal EGD.  Post colonoscopy on 01/26/2022 by Dr. Lyndel Safe, which showed, Multiple anastomotic ulcers s/o Crohn's recurrence. Biopsied. The ulcers are likely the etiology of recent bleeding in setting of naproxen- Non-bleeding internal hemorrhoids.  Hospital course complicated by right knee pain with effusion.  Admission x-ray of right knee showed severe osteoarthritis.  Consulted orthopedic surgery,  4 view x-ray of right knee showed Increased, moderately large knee joint effusion. No acute fracture identified. Tricompartmental osteoarthrosis, greatest in the patellofemoral compartment with an unchanged loose body.  Post right knee joint aspiration with more than 40 cc of benign-appearing clear yellow fluid obtained with joint fluid sent to the lab for stat analysis.  01/27/2022: Seen and examined at bedside.  Main complaints is of severe right knee pain, prior to joint aspiration.  She is unable to bear weight on it.    Assessment/Plan: Principal Problem:   Symptomatic anemia Active Problems:   GIB (gastrointestinal bleeding)   History of Crohn's disease   Leukocytosis   Near syncope   Elevated d-dimer   Hypocalcemia  Acute blood loss anemia, symptomatic anemia In the setting of Crohn's flare. Presented with dizziness, lightheadedness, positive FOBT, drop in hemoglobin down to 6.9 from baseline of 12. Post colonoscopy on 01/26/2022 by Dr. Lyndel Safe, which showed, Multiple anastomotic ulcers s/o Crohn's recurrence. Biopsied. The  ulcers are likely the etiology of recent bleeding in setting of naproxen- Non-bleeding internal hemorrhoids. Received Protonix drip. Switch to p.o. Protonix 40 mg daily on 01/28/2022. Post EGD on 01/25/2022 showing antral erosion. Continue to monitor H&H.  Large right knee effusion, post fall, POA Consulted orthopedic surgery,  4 view x-ray of right knee showed Increased, moderately large knee joint effusion. No acute fracture identified. Tricompartmental osteoarthrosis, greatest in the patellofemoral compartment with an unchanged loose body.  Post right knee joint aspiration with more than 40 cc of benign-appearing clear yellow fluid obtained with joint fluid sent to the lab for stat analysis. Pain control.   Crohn's disease with flare:  Pharmacy consulted to start prednisone taper as recommended by GI.   Leukocytosis: Suspect reactive, WBC is downtrending with antibiotics.  Severe morbid obesity BMI 46 Recommend weight loss outpatient with regular physical activity and healthy dieting.     DVT prophylaxis: SCDs Start: 01/24/22 1849     Code Status: Full Family Communication: None at bedside. Disposition Plan: Status is: Inpatient Remains inpatient appropriate because: Blood transfusions, inpatient procedures       Consultants:  Gastroenterology   Procedures:  EGD  Colonoscopy   Antimicrobials:  None      Status is: Inpatient The patient requires at least 2 midnights for further evaluation and treatment of present condition.    Objective: Vitals:   01/26/22 1450 01/26/22 1500 01/26/22 2127 01/27/22 1115  BP: (!) 135/56 137/66 (!) 109/57 115/64  Pulse: 99 96 (!) 104 99  Resp: (!) 25 15  16   Temp:   98.2 F (36.8 C) 98.5 F (36.9 C)  TempSrc:   Oral Oral  SpO2: 99% 100% 99% 95%  Weight:  Height:        Intake/Output Summary (Last 24 hours) at 01/27/2022 1518 Last data filed at 01/27/2022 1208 Gross per 24 hour  Intake 71.87 ml  Output 2000 ml  Net  -1928.13 ml   Filed Weights   01/24/22 1934 01/25/22 1106 01/26/22 1331  Weight: 119.8 kg 119.8 kg 120.2 kg    Exam:  General: 41 y.o. year-old female development nourished in no acute stress.  She is alert oriented x3.   Cardiovascular: Regular rate and rhythm no rubs or gallops.   Respiratory: Clear to station no wheezes or rales.   Abdomen: Soft nontender nondistended with normal bowel sounds x4 quadrants. Musculoskeletal: Right knee effusion. Skin: No ulcerative lesions. Psychiatry: Mood is appropriate for condition and setting.   Data Reviewed: CBC: Recent Labs  Lab 01/24/22 1223 01/25/22 0632 01/25/22 1615 01/26/22 1025 01/27/22 0541  WBC 20.5* 16.9* 17.6* 19.4* 16.5*  NEUTROABS  --   --  11.2* 12.9*  --   HGB 7.2* 6.9* 8.5* 9.8* 7.7*  HCT 23.5* 22.0* 27.0* 31.9* 25.5*  MCV 78.9* 78.6* 82.6 83.7 85.6  PLT 344 277 263 307 621   Basic Metabolic Panel: Recent Labs  Lab 01/24/22 1223 01/24/22 1238 01/25/22 0328  NA 134*  --  136  K 3.7  --  3.8  CL 106  --  108  CO2 22  --  23  GLUCOSE 132*  --  97  BUN 39*  --  25*  CREATININE 0.56  --  0.63  CALCIUM 7.6*  --  7.4*  7.6*  MG  --  1.8  --    GFR: Estimated Creatinine Clearance: 119.7 mL/min (by C-G formula based on SCr of 0.63 mg/dL). Liver Function Tests: Recent Labs  Lab 01/25/22 0328  AST 14*  ALT 12  ALKPHOS 42  BILITOT 0.4  PROT 5.2*  ALBUMIN 2.3*  2.3*   No results for input(s): "LIPASE", "AMYLASE" in the last 168 hours. No results for input(s): "AMMONIA" in the last 168 hours. Coagulation Profile: No results for input(s): "INR", "PROTIME" in the last 168 hours. Cardiac Enzymes: Recent Labs  Lab 01/24/22 1238  CKTOTAL 267*   BNP (last 3 results) No results for input(s): "PROBNP" in the last 8760 hours. HbA1C: No results for input(s): "HGBA1C" in the last 72 hours. CBG: Recent Labs  Lab 01/24/22 1214  GLUCAP 125*   Lipid Profile: No results for input(s): "CHOL", "HDL",  "LDLCALC", "TRIG", "CHOLHDL", "LDLDIRECT" in the last 72 hours. Thyroid Function Tests: No results for input(s): "TSH", "T4TOTAL", "FREET4", "T3FREE", "THYROIDAB" in the last 72 hours. Anemia Panel: Recent Labs    01/25/22 0328  TIBC 269  IRON 153   Urine analysis:    Component Value Date/Time   COLORURINE STRAW (A) 01/24/2022 1221   APPEARANCEUR CLEAR 01/24/2022 1221   LABSPEC 1.014 01/24/2022 1221   PHURINE 5.0 01/24/2022 1221   GLUCOSEU NEGATIVE 01/24/2022 1221   GLUCOSEU NEGATIVE 04/08/2019 1645   HGBUR NEGATIVE 01/24/2022 1221   HGBUR 3+ 04/07/2010 0841   BILIRUBINUR NEGATIVE 01/24/2022 1221   BILIRUBINUR n 04/07/2014 1509   KETONESUR NEGATIVE 01/24/2022 1221   PROTEINUR NEGATIVE 01/24/2022 1221   UROBILINOGEN 0.2 04/08/2019 1645   NITRITE NEGATIVE 01/24/2022 1221   LEUKOCYTESUR NEGATIVE 01/24/2022 1221   Sepsis Labs: @LABRCNTIP (procalcitonin:4,lacticidven:4)  ) Recent Results (from the past 240 hour(s))  SARS Coronavirus 2 by RT PCR (hospital order, performed in [facility] hospital lab) *cepheid single result test* Anterior Nasal Swab  Status: None   Collection Time: 01/24/22 12:50 PM   Specimen: Anterior Nasal Swab  Result Value Ref Range Status   SARS Coronavirus 2 by RT PCR NEGATIVE NEGATIVE Final    Comment: (NOTE) SARS-CoV-2 target nucleic acids are NOT DETECTED.  The SARS-CoV-2 RNA is generally detectable in upper and lower respiratory specimens during the acute phase of infection. The lowest concentration of SARS-CoV-2 viral copies this assay can detect is 250 copies / mL. A negative result does not preclude SARS-CoV-2 infection and should not be used as the sole basis for treatment or other patient management decisions.  A negative result may occur with improper specimen collection / handling, submission of specimen other than nasopharyngeal swab, presence of viral mutation(s) within the areas targeted by this assay, and inadequate number of viral  copies (<250 copies / mL). A negative result must be combined with clinical observations, patient history, and epidemiological information.  Fact Sheet for Patients:   https://www.patel.info/  Fact Sheet for Healthcare Providers: https://Roxsana Riding.com/  This test is not yet approved or  cleared by the [place] FDA and has been authorized for detection and/or diagnosis of SARS-CoV-2 by FDA under an Emergency Use Authorization (EUA).  This EUA will remain in effect (meaning this test can be used) for the duration of the COVID-19 declaration under Section 564(b)(1) of the Act, 21 U.S.C. section 360bbb-3(b)(1), unless the authorization is terminated or revoked sooner.  Performed at [facility], [place] [address] [place], [place] [address]       Studies: DG Knee 4 Views W/Patella Right  Result Date: 01/27/2022 CLINICAL DATA:  Anterior and medial right knee pain since a fall 2 days ago. EXAM: RIGHT KNEE - COMPLETE 4+ VIEW COMPARISON:  Right knee radiographs 01/25/2022 FINDINGS: No acute fracture or dislocation is identified. There is a moderately large knee joint effusion which has greatly increased in size since the prior study. Moderate joint space narrowing and moderate marginal osteophytosis involve the patellofemoral compartment. Milder marginal osteophytosis is noted in the medial and lateral compartments where joint space width is preserved. A 2 cm loose body again projects in the region of the lateral patellofemoral joint space. IMPRESSION: 1. Increased, moderately large knee joint effusion. No acute fracture identified. 2. Tricompartmental osteoarthrosis, greatest in the patellofemoral compartment with an unchanged loose body. Electronically Signed   By: Logan Bores M.D.   On: 01/27/2022 11:15    Scheduled Meds:  calcium-vitamin D  1 tablet Oral QAC breakfast   Chlorhexidine Gluconate Cloth  6 each Topical Q0600    lidocaine  1 patch Transdermal Q24H   loratadine  10 mg Oral Daily   [START ON 01/28/2022] pantoprazole  40 mg Oral Q0600    Continuous Infusions:  sodium chloride       LOS: 3 days     Kayleen Memos, MD [facility] Pager [phone]  If 7PM-7AM, please contact night-coverage [email or web address] Password [facility] 01/27/2022, 3:18 PM

## 2022-01-27 NOTE — Consult Note (Signed)
Orthopaedic Consult  Date/Time: 01/27/22 2:06 PM  Patient Name: [name]  Attending Physician: Kayleen Memos, DO    ASSESSMENT & PLAN  Orthopaedic Assessment: 41 y.o. female with apparently atraumatic right knee pain and effusion, very limited range of motion and inability to bear weight due to pain and effusion.  Reductions/Procedures/Splinting/Anesthesia Performed: Reductions: None Splinting/casting: None Procedure(s): Right knee aspiration (20610) -- please see separate procedural documentation Anesthesia: N/A  Plan: Given the presence of pain and effusion of uncertain etiology and inability to mobilize or bear weight due to pain and effusion, I did recommend proceeding with diagnostic aspiration.  Please refer to separate procedural documentation.  Fluid was sent to lab for stat analysis including cell count, Gram stain, aerobic and anaerobic culture, and crystal analysis.  Patient was made n.p.o. pending results of this fluid analysis.  If there is a concern for intra-articular infection based on results of fluid analysis, patient will require surgery to address the infection.  If fluid analysis results are benign and not consistent with infection, recommend obtaining an MRI of the knee in order to evaluate for any acute soft tissue abnormality which may contribute to her inability to mobilize and bear weight.  If this is normal and does not reveal any acute injury, no orthopedic follow-up would be required.  I would recommend rheumatology evaluation and follow-up with rheumatology.  Will follow closely for fluid analysis results, with further treatment recommendations pending those results.   Georgeanna Harrison M.D. Orthopaedic Surgery [facility]   Medical Decision Making  Amount/complexity of data: Is there a current pathologic fracture (e.g. neoplastic, osteoporotic insufficiency fracture)? No Independent interpretation of radiographic studies:  Yes Review of radiology results (e.g. reports): Yes Tests ordered (e.g. additional radiographic studies, labs): Yes Lab results reviewed: Yes Reviewed old records: Yes History from another source (independent historian, e.g. family/friend/etc.): No Discussion of imaging, clinical data, and or management with independent medical provider: Yes Risk: Patient receiving IV controlled substances for pain: Yes Fracture requiring manipulation: No Urgent or emergent (non-elective) surgery likely this admission: Pending test results Presence of medical comorbidities and/or surgical risk factors (e.g. current smoker, CAD, diabetes, COPD, CKD, etc.): Yes Closed fracture management WITHOUT manipulation: No Urgent minor procedure (e.g. joint aspiration, compartment pressure measurement, etc.): Yes Will likely need surgery as an outpatient: No     HPI [name] is a 41 y.o. female. Orthopaedic consultation has specifically been requested to address this patient's current musculoskeletal presentation.  She presented to the hospital after loss of consciousness attributed to anemia in the setting of Crohn's disease.  Initially she did not have right knee pain, but after several procedures developed apparently atraumatic onset of diffuse right knee pain, decreased motion, swelling, and inability to bear weight.  She denies any history of gout or other joint related inflammatory conditions.  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   PMH Past Medical History:  Diagnosis Date   Abrasion of upper arm with infection, left, sequela    started antibiotics lef arm oct 4th started antibiotics healing well   Anemia    hx of 2 yrs ago   Chicken pox    Chronic rhinitis    Contact dermatitis and other eczema, due to unspecified cause    Crohn's 11-15-11   no  issues in 2 yrs   Fibroids  11-15-11   remains with fibroids   Hernia    Incisional hernia, RLQ 10/02/2011   Infertility, female    Mononucleosis    Obesity    Pilonidal abscess    Rectal abscess    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[place])    Vitamin D deficiency      PSH Past Surgical History:  Procedure Laterality Date   APPENDECTOMY  7/06   CESAREAN SECTION N/A 11/30/2020   Procedure: CESAREAN SECTION;  Surgeon: Everett Graff, MD;  Location: [facility];  Service: Obstetrics;  Laterality: N/A;   COLONOSCOPY WITH PROPOFOL N/A 03/07/2017   Procedure: COLONOSCOPY WITH PROPOFOL;  Surgeon: Milus Banister, MD;  Location: [facility];  Service: Endoscopy;  Laterality: N/A;   EYE SURGERY  11-15-11   2003-multiple stys   ileocecal resection  1/11   crohns disease with fisula/stricture. Dr. Ronnald Collum   INCISIONAL HERNIA REPAIR N/A 12/24/2018   Procedure: OPEN REPAIR VENTRAL INCISIONAL HERNIA WITH MESH PATCH AND PRIMARY REPAIR OF LEFT SPIGELIAN HERNIA;  Surgeon: Armandina Gemma, MD;  Location: [facility];  Service: General;  Laterality: N/A;   KNEE ARTHROSCOPY  99, 02   Bil.   MYOMECTOMY N/A 11/10/2014   Procedure: MYOMECTOMY;  Surgeon: Everett Graff, MD;  Location: [facility];  Service: Gynecology;  Laterality: N/A;   PILONIDAL CYST EXCISION  2011, 2012   I&Ds   VENTRAL HERNIA REPAIR  11/27/2011   Procedure: LAPAROSCOPIC VENTRAL HERNIA;  Surgeon: Adin Hector, MD;  Location: [facility];  Service: General;  Laterality: N/A;  Laparoscopic Exploration & Repair of Hernia in Abdomen   WISDOM TOOTH EXTRACTION     Home Medications Prior to Admission medications   Medication Sig Start Date End Date Taking? Authorizing Provider  Calcium Carb-Cholecalciferol (CALCIUM/VITAMIN D) 600-400 MG-UNIT TABS Take 1 tablet by mouth daily.   Yes [provider]  fexofenadine (ALLEGRA) 180 MG tablet Take 180 mg by mouth daily.   Yes [provider]  fluticasone (FLONASE) 50 MCG/ACT nasal spray Place  1 spray into both nostrils daily as needed for allergies.   Yes [provider]  metFORMIN (GLUCOPHAGE) 500 MG tablet Take 1 tablet (500 mg total) by mouth 2 (two) times daily with a meal. 12/26/21  Yes Danford, Katy D, NP  naproxen (NAPROSYN) 500 MG tablet Take 500 mg by mouth 2 (two) times daily. 01/11/22  Yes [provider]  nystatin-triamcinolone ointment (MYCOLOG) Apply 1 Application topically 2 (two) times daily as needed (rash). 06/12/21  Yes [provider]  Prenatal Vit-Fe Fumarate-FA (PRENATAL MULTIVITAMIN) TABS tablet Take 1 tablet by mouth daily at 12 noon.   Yes [provider]  Vitamin D, Cholecalciferol, 50 MCG (2000 UT) CAPS Take 2,000 Units by mouth daily.   Yes [provider]  amoxicillin-clavulanate (AUGMENTIN) 875-125 MG tablet Take 1 tablet by mouth 2 (two) times daily. 01/11/22   [provider]     Allergies No Known Allergies   Family History Family History  Problem Relation Age of Onset   Aneurysm Mother        brain   Heart disease Mother 5       heart anyrism   Healthy Father        fairly   Diabetes Sister        half sister   Bipolar disorder Sister        half sister   Colon cancer Maternal Grandmother    Stomach cancer Paternal Grandfather  Multiple sclerosis Sister     Social History Social History   Socioeconomic History   Marital status: Married    Spouse name: Woodie Trusty   Number of children: Not on file   Years of education: Not on file   Highest education level: Bachelor's degree (e.g., BA, AB, BS)  Occupational History   Occupation: Music therapist  Tobacco Use   Smoking status: Never   Smokeless tobacco: Never  Vaping Use   Vaping Use: Never used  Substance and Sexual Activity   Alcohol use: No   Drug use: No   Sexual activity: Yes    Partners: Male  Other Topics Concern   Not on file  Social History Narrative   married   [place] of 2   Occupation: Music therapist [place]  high school bs in math    No pets   Sleep 6-7 hours    BF with 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   Walking exercise   Eats balanced generally   Social Determinants of Health   Financial Resource Strain: Low Risk  (04/02/2021)   Overall Financial Resource Strain (CARDIA)    Difficulty of Paying Living Expenses: Not hard at all  Food Insecurity: No Food Insecurity (04/02/2021)   Hunger Vital Sign    Worried About Running Out of Food in the Last Year: Never true    [place] in the Last Year: Never true  Transportation Needs: No Transportation Needs (04/02/2021)   PRAPARE - Hydrologist (Medical): No    Lack of Transportation (Non-Medical): No  Physical Activity: Unknown (04/02/2021)   Exercise Vital Sign    Days of Exercise per Week: 0 days    Minutes of Exercise per Session: Not on file  Stress: No Stress Concern Present (04/02/2021)   [employer]    Feeling of Stress : Not at all  Social Connections: [place] (04/02/2021)   Social Connection and Isolation Panel [NHANES]    Frequency of Communication with Friends and Family: More than three times a week    Frequency of Social Gatherings with Friends and Family: Patient refused    Attends Religious Services: More than 4 times per year    Active Member of [employer] or Organizations: Yes    Attends Archivist Meetings: 1 to 4 times per year    Marital Status: Married  Human resources officer Violence: Not on file     Review of Systems MSK: As noted per HPI above GI: No current Nausea/vomiting ENT: Denies sore throat, epistaxis CV: Denies chest pain  Resp: No current shortness of breath  Other than mentioned above, there are no Constitutional, Neurological, Psychiatric, ENT, Ophthalmological, Cardiovascular, Respiratory, GI, GU, Musculoskeletal, Integumentary, Lymphatic, Endocrine or Allergic issues.     Imaging  Independent  interpretation of orthopaedic-relevant films: Multiple views including sunrise projection of the right knee demonstrate advanced degenerative change of the patellofemoral joint and lateral tracking of the patella.  There is an ossific density possibly a loose body projecting in the region of the lateral gutter.  No fractures or acute osseous abnormalities appreciated.  Radiographic results: DG Knee 4 Views W/Patella Right  Result Date: 01/27/2022 CLINICAL DATA:  Anterior and medial right knee pain since a fall 2 days ago. EXAM: RIGHT KNEE - COMPLETE 4+ VIEW COMPARISON:  Right knee radiographs 01/25/2022 FINDINGS: No acute fracture or dislocation is identified. There is a moderately large knee joint effusion which  has greatly increased in size since the prior study. Moderate joint space narrowing and moderate marginal osteophytosis involve the patellofemoral compartment. Milder marginal osteophytosis is noted in the medial and lateral compartments where joint space width is preserved. A 2 cm loose body again projects in the region of the lateral patellofemoral joint space. IMPRESSION: 1. Increased, moderately large knee joint effusion. No acute fracture identified. 2. Tricompartmental osteoarthrosis, greatest in the patellofemoral compartment with an unchanged loose body. Electronically Signed   By: Logan Bores M.D.   On: 01/27/2022 11:15   ECHOCARDIOGRAM COMPLETE  Result Date: 01/25/2022    ECHOCARDIOGRAM REPORT   Patient Name:   DOSIA YODICE Date of Exam: 01/25/2022 Medical Rec #:  [id number]          Height:       62.5 in Accession #:    [id number]         Weight:       264.1 lb Date of Birth:  [birth year]         BSA:          2.164 m Patient Age:    84 years           BP:           124/57 mmHg Patient Gender: F                  HR:           79 bpm. Exam Location:  Inpatient Procedure: 2D Echo Indications:    Syncope  History:        Patient has no prior history of Echocardiogram examinations.                  Signs/Symptoms:Syncope; Risk Factors:Non-Smoker.  Sonographer:    Harvie Junior Referring Phys: [id number] Jonnie Finner  Sonographer Comments: Patient is obese. Image acquisition challenging due to patient body habitus. IMPRESSIONS  1. Left ventricular ejection fraction, by estimation, is 60 to 65%. Left ventricular ejection fraction by 2D MOD biplane is 62.8 %. Left ventricular ejection fraction by PLAX is 61 %. The left ventricle has normal function. The left ventricle has no regional wall motion abnormalities. Left ventricular diastolic parameters were normal.  2. Right ventricular systolic function is normal. The right ventricular size is normal. There is normal pulmonary artery systolic pressure.  3. The mitral valve is normal in structure. Trivial mitral valve regurgitation. No evidence of mitral stenosis.  4. The aortic valve is tricuspid. Aortic valve regurgitation is not visualized. No aortic stenosis is present.  5. The inferior vena cava is normal in size with greater than 50% respiratory variability, suggesting right atrial pressure of 3 mmHg. FINDINGS  Left Ventricle: Left ventricular ejection fraction, by estimation, is 60 to 65%. Left ventricular ejection fraction by PLAX is 61 %. Left ventricular ejection fraction by 2D MOD biplane is 62.8 %. The left ventricle has normal function. The left ventricle has no regional wall motion abnormalities. The left ventricular internal cavity size was normal in size. There is no left ventricular hypertrophy. Left ventricular diastolic parameters were normal. Normal left ventricular filling pressure. Right Ventricle: The right ventricular size is normal. No increase in right ventricular wall thickness. Right ventricular systolic function is normal. There is normal pulmonary artery systolic pressure. The tricuspid regurgitant velocity is 1.89 m/s, and  with an assumed right atrial pressure of 3 mmHg, the estimated right ventricular systolic pressure is 19.4  mmHg. Left Atrium: Left atrial size  was normal in size. Right Atrium: Right atrial size was normal in size. Pericardium: There is no evidence of pericardial effusion. Mitral Valve: The mitral valve is normal in structure. Mild mitral annular calcification. Trivial mitral valve regurgitation. No evidence of mitral valve stenosis. Tricuspid Valve: The tricuspid valve is normal in structure. Tricuspid valve regurgitation is not demonstrated. No evidence of tricuspid stenosis. Aortic Valve: The aortic valve is tricuspid. Aortic valve regurgitation is not visualized. No aortic stenosis is present. Aortic valve mean gradient measures 5.0 mmHg. Aortic valve peak gradient measures 7.8 mmHg. Aortic valve area, by VTI measures 2.17 cm. Pulmonic Valve: The pulmonic valve was normal in structure. Pulmonic valve regurgitation is not visualized. No evidence of pulmonic stenosis. Aorta: The aortic root is normal in size and structure. Venous: The inferior vena cava is normal in size with greater than 50% respiratory variability, suggesting right atrial pressure of 3 mmHg. IAS/Shunts: No atrial level shunt detected by color flow Doppler.  LEFT VENTRICLE PLAX 2D                        Biplane EF (MOD) LV EF:         Left            LV Biplane EF:   Left                ventricular                      ventricular                ejection                         ejection                fraction by                      fraction by                PLAX is 61                       2D MOD                %.                               biplane is LVIDd:         4.60 cm                          62.8 %. LVIDs:         3.10 cm LV PW:         0.80 cm         Diastology LV IVS:        0.90 cm         LV e' medial:    11.70 cm/s LVOT diam:     2.00 cm         LV E/e' medial:  7.4 LV SV:         65              LV e' lateral:   16.80 cm/s LV SV Index:   30  LV E/e' lateral: 5.1 LVOT Area:     3.14 cm  LV Volumes (MOD) LV vol d, MOD     93.1 ml A2C: LV vol d, MOD    111.0 ml A4C: LV vol s, MOD    36.4 ml A2C: LV vol s, MOD    40.4 ml A4C: LV SV MOD A2C:   56.7 ml LV SV MOD A4C:   111.0 ml LV SV MOD BP:    67.2 ml RIGHT VENTRICLE RV Basal diam:  3.20 cm RV Mid diam:    2.40 cm LEFT ATRIUM             Index        RIGHT ATRIUM           Index LA diam:        3.50 cm 1.62 cm/m   RA Area:     11.10 cm LA Vol (A2C):   29.3 ml 13.54 ml/m  RA Volume:   21.90 ml  10.12 ml/m LA Vol (A4C):   19.9 ml 9.19 ml/m LA Biplane Vol: 25.6 ml 11.83 ml/m  AORTIC VALVE                     PULMONIC VALVE AV Area (Vmax):    2.19 cm      PV Vmax:       1.05 m/s AV Area (Vmean):   2.22 cm      PV Peak grad:  4.4 mmHg AV Area (VTI):     2.17 cm AV Vmax:           140.00 cm/s AV Vmean:          102.000 cm/s AV VTI:            0.300 m AV Peak Grad:      7.8 mmHg AV Mean Grad:      5.0 mmHg LVOT Vmax:         97.50 cm/s LVOT Vmean:        72.000 cm/s LVOT VTI:          0.207 m LVOT/AV VTI ratio: 0.69  AORTA Ao Root diam: 2.70 cm Ao Asc diam:  2.60 cm MITRAL VALVE               TRICUSPID VALVE MV Area (PHT): 2.95 cm    TR Peak grad:   14.3 mmHg MV Decel Time: 257 msec    TR Vmax:        189.00 cm/s MR Peak grad: 44.6 mmHg MR Vmax:      334.00 cm/s  SHUNTS MV E velocity: 86.50 cm/s  Systemic VTI:  0.21 m MV A velocity: 55.70 cm/s  Systemic Diam: 2.00 cm MV E/A ratio:  1.55 Skeet Latch MD Electronically signed by Skeet Latch MD Signature Date/Time: 01/25/2022/6:08:12 PM    Final    DG Knee 1-2 Views Right  Result Date: 01/25/2022 CLINICAL DATA:  Right knee pain for 1-2 days.  No known injury. EXAM: RIGHT KNEE - 1-2 VIEW COMPARISON:  None Available. FINDINGS: Severe patellofemoral joint space narrowing with moderate peripheral degenerative osteophytes. Small joint effusion. There is a 2.3 cm mineralized loose body just lateral to the superior aspect of the lateral femoral condyle, likely within the lateral patellofemoral joint space. Moderate peripheral  lateral femoral condyle and mild peripheral lateral tibial plateau degenerative osteophytes. The medial and lateral compartment joint spaces are maintained. No acute fracture or dislocation. IMPRESSION: 1. Severe patellofemoral osteoarthritis.  2. Superolateral knee loose body measuring up to 2.3 cm. Electronically Signed   By: Yvonne Kendall M.D.   On: 01/25/2022 17:50   DG Ankle 2 Views Left  Result Date: 01/25/2022 CLINICAL DATA:  Left ankle pain for 1-2 days. EXAM: LEFT ANKLE - 2 VIEW COMPARISON:  None Available. FINDINGS: Normal bone mineralization. The ankle mortise is symmetric and intact. Minimal distal medial malleolar degenerative spurring. No acute fracture is seen. No dislocation. Joint spaces are preserved. IMPRESSION: No significant osteoarthritis.  No acute fracture. Electronically Signed   By: Yvonne Kendall M.D.   On: 01/25/2022 17:49   CT Angio Abd/Pel w/ and/or w/o  Result Date: 01/25/2022 CLINICAL DATA:  Concern for GI bleeding. History of ileal cecal Crohn's disease. EXAM: CT ANGIOGRAPHY ABDOMEN AND PELVIS WITH CONTRAST AND WITHOUT CONTRAST TECHNIQUE: Multidetector CT imaging of the abdomen and pelvis was performed using the standard protocol during bolus administration of intravenous contrast. Multiplanar reconstructed images and MIPs were obtained and reviewed to evaluate the vascular anatomy. RADIATION DOSE REDUCTION: This exam was performed according to the departmental dose-optimization program which includes automated exposure control, adjustment of the mA and/or kV according to patient size and/or use of iterative reconstruction technique. CONTRAST:  133m OMNIPAQUE IOHEXOL 350 MG/ML SOLN COMPARISON:  08/25/2018 FINDINGS: VASCULAR Aorta: Intact aorta. No acute aortic process, aneurysm, dissection, or surrounding inflammatory change. No retroperitoneal hemorrhage or hematoma. Celiac: Widely patent origin including its branches. No active bleeding within the GI tract throughout the  celiac territory. SMA: Widely patent origin including its central mesenteric branches. No active arterial GI bleeding in the SMA vascular territory. Renals: Single renal arteries appear widely patent. IMA: IMA origin remains patent off the distal aorta including its branches. No active arterial bleeding in the IMA vascular territory. Inflow: Patent without evidence of aneurysm, dissection, vasculitis or significant stenosis. Proximal Outflow: Bilateral common femoral and visualized portions of the superficial and profunda femoral arteries are patent without evidence of aneurysm, dissection, vasculitis or significant stenosis. Veins: Venous phase imaging not performed Review of the MIP images confirms the above findings. NON-VASCULAR Lower chest: Scattered bibasilar atelectasis. No pericardial pleural effusion. Prominent heart size. Hepatobiliary: No focal liver abnormality is seen. No gallstones, gallbladder wall thickening, or biliary dilatation. Pancreas: Unremarkable. No pancreatic ductal dilatation or surrounding inflammatory changes. Spleen: Normal in size without focal abnormality. Adrenals/Urinary Tract: Adrenal glands are unremarkable. Kidneys are normal, without renal calculi, focal lesion, or hydronephrosis. Bladder is collapsed by Foley catheter. Stomach/Bowel: Negative for bowel obstruction, significant dilatation, ileus, or free air. Small lateral right lower quadrant abdominal wall hernia containing a fluid distended loop of small bowel but without obstruction or incarceration. Similar appearance. Postop changes of the cecum again noted. No free fluid, fluid collection, hemorrhage, hematoma, abscess or ascites. Lymphatic: No bulky adenopathy. Reproductive: Uterine fibroids again noted. No adnexal abnormality. No free fluid, fluid collection, or hemorrhage in the pelvis. Other: Midline fat containing ventral hernia above the umbilicus. This is slightly enlarged. Previous left lower quadrant spigelian  abdominal wall hernia no longer evident, suspect surgical repair. Musculoskeletal: No acute or significant osseous finding. IMPRESSION: VASCULAR Negative for acute arterial GI bleeding in the abdomen or pelvis. No other acute vascular process. NON-VASCULAR No other acute intra-abdominal or pelvic finding by CTA. Slight enlargement of the midline fat containing ventral hernia. Stable chronic and postoperative findings as above. Electronically Signed   By: MJerilynn Mages  Shick M.D.   On: 01/25/2022 15:22   VAS [place]OWER EXTREMITY VENOUS (DVT)  Result Date: 01/25/2022  Lower Venous DVT Study Patient Name:  KLYN KROENING  Date of Exam:   01/25/2022 Medical Rec #: [id number]           Accession #:    [id number] Date of Birth: [birth year]          Patient Gender: F Patient Age:   75 years Exam [facility]  [facility] Procedure:      VAS [name] LOWER EXTREMITY VENOUS (DVT) Referring Phys: Cherylann Ratel --------------------------------------------------------------------------------  Indications: Elevated Ddimer.  Risk Factors: None identified. Limitations: Body habitus and poor ultrasound/tissue interface. Comparison Study: No prior studies. Performing Technologist: Oliver Hum RVT  Examination Guidelines: A complete evaluation includes B-mode imaging, spectral Doppler, color Doppler, and power Doppler as needed of all accessible portions of each vessel. Bilateral testing is considered an integral part of a complete examination. Limited examinations for reoccurring indications may be performed as noted. The reflux portion of the exam is performed with the patient in reverse Trendelenburg.  +---------+---------------+---------+-----------+----------+--------------+ RIGHT    CompressibilityPhasicitySpontaneityPropertiesThrombus Aging +---------+---------------+---------+-----------+----------+--------------+ CFV      Full           Yes      Yes                                  +---------+---------------+---------+-----------+----------+--------------+ SFJ      Full                                                        +---------+---------------+---------+-----------+----------+--------------+ FV Prox  Full                                                        +---------+---------------+---------+-----------+----------+--------------+ FV Mid   Full                                                        +---------+---------------+---------+-----------+----------+--------------+ FV DistalFull           Yes      Yes                                 +---------+---------------+---------+-----------+----------+--------------+ PFV      Full                                                        +---------+---------------+---------+-----------+----------+--------------+ POP      Full           Yes      Yes                                 +---------+---------------+---------+-----------+----------+--------------+ PTV  Full                                                        +---------+---------------+---------+-----------+----------+--------------+ PERO     Full                                                        +---------+---------------+---------+-----------+----------+--------------+   +---------+---------------+---------+-----------+----------+--------------+ LEFT     CompressibilityPhasicitySpontaneityPropertiesThrombus Aging +---------+---------------+---------+-----------+----------+--------------+ CFV      Full           Yes      Yes                                 +---------+---------------+---------+-----------+----------+--------------+ SFJ      Full                                                        +---------+---------------+---------+-----------+----------+--------------+ FV Prox  Full                                                         +---------+---------------+---------+-----------+----------+--------------+ FV Mid   Full                                                        +---------+---------------+---------+-----------+----------+--------------+ FV DistalFull                                                        +---------+---------------+---------+-----------+----------+--------------+ PFV      Full                                                        +---------+---------------+---------+-----------+----------+--------------+ POP      Full           Yes      Yes                                 +---------+---------------+---------+-----------+----------+--------------+ PTV      Full                                                        +---------+---------------+---------+-----------+----------+--------------+  PERO     Full                                                        +---------+---------------+---------+-----------+----------+--------------+     Summary: RIGHT: - There is no evidence of deep vein thrombosis in the lower extremity. However, portions of this examination were limited- see technologist comments above.  - No cystic structure found in the popliteal fossa.  LEFT: - There is no evidence of deep vein thrombosis in the lower extremity. However, portions of this examination were limited- see technologist comments above.  - No cystic structure found in the popliteal fossa.  *See table(s) above for measurements and observations. Electronically signed by Orlie Pollen on 01/25/2022 at 1:57:49 PM.    Final    CT Angio Chest PE W and/or Wo Contrast  Result Date: 01/24/2022 CLINICAL DATA:  Syncope, positive D-dimer EXAM: CT ANGIOGRAPHY CHEST WITH CONTRAST TECHNIQUE: Multidetector CT imaging of the chest was performed using the standard protocol during bolus administration of intravenous contrast. Multiplanar CT image reconstructions and MIPs were obtained to evaluate the vascular  anatomy. RADIATION DOSE REDUCTION: This exam was performed according to the departmental dose-optimization program which includes automated exposure control, adjustment of the mA and/or kV according to patient size and/or use of iterative reconstruction technique. CONTRAST:  159m OMNIPAQUE IOHEXOL 350 MG/ML SOLN COMPARISON:  Chest radiograph done earlier today FINDINGS: Cardiovascular: There is homogeneous enhancement in thoracic aorta. There are no intraluminal filling defects in central pulmonary artery branches. Evaluation of small peripheral branches he is limited by motion artifacts. Mediastinum/Nodes: No significant lymphadenopathy is seen. Lungs/Pleura: There is no focal pulmonary consolidation. There is no pleural effusion or pneumothorax. Upper Abdomen: Unremarkable. Musculoskeletal: Unremarkable. Review of the MIP images confirms the above findings. IMPRESSION: There is no evidence of central pulmonary artery embolism. There is no evidence of thoracic aortic dissection. There is no focal pulmonary consolidation. Electronically Signed   By: PElmer PickerM.D.   On: 01/24/2022 14:59   CT Head Wo Contrast  Result Date: 01/24/2022 CLINICAL DATA:  Provided history: Headache, syncope. Additional history provided: Head trauma. EXAM: CT HEAD WITHOUT CONTRAST TECHNIQUE: Contiguous axial images were obtained from the base of the skull through the vertex without intravenous contrast. RADIATION DOSE REDUCTION: This exam was performed according to the departmental dose-optimization program which includes automated exposure control, adjustment of the mA and/or kV according to patient size and/or use of iterative reconstruction technique. COMPARISON:  None Available. FINDINGS: Brain: Cerebral volume is normal. There is no acute intracranial hemorrhage. No demarcated cortical infarct. No extra-axial fluid collection. No evidence of an intracranial mass. No midline shift. Vascular: No hyperdense vessel. Skull:  No fracture or aggressive osseous lesion. Sinuses/Orbits: No mass or acute finding within the imaged orbits. No significant paranasal sinus disease. Other: Right parietal scalp hematoma. IMPRESSION: No evidence of acute intracranial abnormality. Right parietal scalp hematoma. Electronically Signed   By: KKellie SimmeringD.O.   On: 01/24/2022 14:59   DG Chest 1 View  Result Date: 01/24/2022 CLINICAL DATA:  Syncope EXAM: CHEST  1 VIEW COMPARISON:  Chest radiograph 11/09/2010 FINDINGS: The cardiomediastinal silhouette is normal. Lung volumes are significantly diminished. There is no focal consolidation or pulmonary edema. There is no pleural effusion or pneumothorax There is no acute osseous abnormality. IMPRESSION: Low lung  volumes. Otherwise, no radiographic evidence of acute cardiopulmonary process. Electronically Signed   By: Valetta Mole M.D.   On: 01/24/2022 12:57   Labs  Recent Labs    01/24/22 1223 01/25/22 0632 01/25/22 1615 01/26/22 1025 01/27/22 0541  WBC 20.5* 16.9* 17.6* 19.4* 16.5*  HGB 7.2* 6.9* 8.5* 9.8* 7.7*  HCT 23.5* 22.0* 27.0* 31.9* 25.5*  PLT 344 277 263 307 271   Recent Labs    01/24/22 1223 01/25/22 0328  NA 134* 136  K 3.7 3.8  CL 106 108  CO2 22 23  BUN 39* 25*  CREATININE 0.56 0.63  GLUCOSE 132* 97  CALCIUM 7.6* 7.4*  7.6*   Lab Results  Component Value Date   INR 1.47 05/26/2009        Physical Examination  Patient is a 41 y.o. year old female who is overweight and anxious, mood is calm.  Orientation: oriented to person, place, time, and general circumstances  Vital Signs: BP 115/64 (BP Location: Left Leg)   Pulse 99   Temp 98.5 F (36.9 C) (Oral)   Resp 16   Ht 5' 3"  (1.6 m)   Wt 120.2 kg   LMP 12/27/2021   SpO2 95%   BMI 46.94 kg/m    Gait: Unable to ambulate due to right knee pain  Heart: Normal rate Lungs: Non-labored breathing Abdomen: Soft, Non-tender   Right Upper Extremity: Inspection: Atraumatic Palpation: Nontender ROM:  Full, painless Strength: Normal Sensation: Intact to light touch distally Skin: Intact Peripheral Vascular: Well perfused Joint Stability: No instability Reflexes: No pathologic Lymph Nodes: None Palpable Coordination: Intact, normal   Left Upper Extremity: Inspection: Atraumatic Palpation: Nontender ROM: Full, painless Strength: Normal Sensation: Intact to light touch distally Skin: Intact Peripheral Vascular: Well perfused Joint Stability: No instability Reflexes: No pathologic Lymph Nodes: None Palpable Coordination: Intact, normal    Right Lower Extremity: Inspection: Exam somewhat limited by body habitus, but overall atraumatic appearance Palpation: Assessment somewhat limited by body habitus, but palpable effusion detected ROM: Very limited knee range of motion due to pain and apparent effusion, 0 degrees to 15 degrees at most Strength: Unable to maintain straight leg raise against gravity; intact dorsiflexion, plantarflexion, and EHL function Sensation: Intact light touch distally in the superficial peroneal, deep peroneal, and tibial distributions Skin: Intact Peripheral Vascular: Normal DP pulse, warm and well-perfused distally Joint Stability: Knee stable to varus and valgus stress Reflexes: No pathologic Lymph Nodes: None Palpable Coordination: Limited by knee pain and swelling   Left Lower Extremity: Inspection: Atraumatic Palpation: Nontender ROM: Full, painless Strength: Normal Sensation: Intact to light touch distally Skin: Intact Peripheral Vascular: Well perfused Joint Stability: No instability Reflexes: No pathologic Lymph Nodes: None Palpable Coordination: Intact, normal    Pelvis: Skin: Intact Palpation: Nontender Stability: No instability      The review of the patient's medications does not in any way constitute an endorsement, by this clinician,  of their use, dosage, indications, route, efficacy, interactions, or other clinical  parameters.  This note was generated within the [employer] EMR using Dragon medical speech recognition software and may contain inherent errors or omissions not intended by the user. Grammatical and punctuation errors, random word insertions, deletions, pronoun errors and incomplete sentences are occasional consequences of this technology due to software limitations. Not all errors are caught or corrected.  Although every attempt is made to root out erroneus and incomplete transcription, the note may still not fully represent the intent or opinion of the author. If  there are questions or concerns about the content of this note or information contained within the body of this dictation they should be addressed directly with the author for clarification.

## 2022-01-27 NOTE — Progress Notes (Signed)
Synovial cell count not concerning for infection. Okay to have diet. MRI R knee ordered to evaluate for internal derangement, but results unlikely to indicate any acute operative interventions. Consider rheumatology evaluation.

## 2022-01-27 NOTE — Procedures (Signed)
Due to the presence of pain and effusion, aspiration was recommended for diagnostic purposes, in order to help guide further treatment.  Risks of procedure including recurrent effusion, failure to relieve pain, and infection were discussed.  Patient understood and wished to proceed, and verbal consent was obtained.  Right knee was confirmed as the appropriate site and verbal consent was obtained.  Anterolateral suprapatellar approach was utilized.  The skin in this area was thoroughly cleansed and prepped, and an 18-gauge needle was introduced into the joint.  Aspiration yielded > 40 mL of benign appearing clear yellow fluid.  This fluid was sent to the lab for STAT analysis (cell count, gram stain, aerobic and anaerobic culture, and crystals).  Band-Aid was applied over the aspiration site.  Patient tolerated this procedure well and without complication.   Georgeanna Harrison M.D. Orthopaedic Surgery [facility]

## 2022-01-27 NOTE — Progress Notes (Signed)
OT Cancellation Note  Patient Details Name: [name] MRN: [id number] DOB: [birth year]   Cancelled Treatment:    Reason Eval/Treat Not Completed: Other (comment) Patient is in recliner with increased pain and drainage from aspiration site and pending testing of fluid retrieved. Nurse made aware. OT to continue to follow and check back on 01/28/22. Rennie Plowman, MS Acute Rehabilitation Department Office# [phone]   01/27/2022, 3:08 PM

## 2022-01-28 ENCOUNTER — Inpatient Hospital Stay (HOSPITAL_COMMUNITY): Payer: BC Managed Care – PPO

## 2022-01-28 ENCOUNTER — Encounter (HOSPITAL_COMMUNITY): Payer: BC Managed Care – PPO

## 2022-01-28 ENCOUNTER — Encounter (HOSPITAL_COMMUNITY): Payer: Self-pay | Admitting: Gastroenterology

## 2022-01-28 DIAGNOSIS — L538 Other specified erythematous conditions: Secondary | ICD-10-CM

## 2022-01-28 DIAGNOSIS — D649 Anemia, unspecified: Secondary | ICD-10-CM | POA: Diagnosis not present

## 2022-01-28 DIAGNOSIS — M7989 Other specified soft tissue disorders: Secondary | ICD-10-CM | POA: Diagnosis not present

## 2022-01-28 LAB — CBC
HCT: 25.1 % — ABNORMAL LOW (ref 36.0–46.0)
Hemoglobin: 7.7 g/dL — ABNORMAL LOW (ref 12.0–15.0)
MCH: 25.6 pg — ABNORMAL LOW (ref 26.0–34.0)
MCHC: 30.7 g/dL (ref 30.0–36.0)
MCV: 83.4 fL (ref 80.0–100.0)
Platelets: 305 10*3/uL (ref 150–400)
RBC: 3.01 MIL/uL — ABNORMAL LOW (ref 3.87–5.11)
RDW: 19.9 % — ABNORMAL HIGH (ref 11.5–15.5)
WBC: 15.4 10*3/uL — ABNORMAL HIGH (ref 4.0–10.5)
nRBC: 0.3 % — ABNORMAL HIGH (ref 0.0–0.2)

## 2022-01-28 LAB — PREPARE RBC (CROSSMATCH)

## 2022-01-28 MED ORDER — ENOXAPARIN SODIUM 40 MG/0.4ML IJ SOSY
40.0000 mg | PREFILLED_SYRINGE | INTRAMUSCULAR | Status: DC
Start: 2022-01-28 — End: 2022-01-29
  Administered 2022-01-28: 40 mg via SUBCUTANEOUS
  Filled 2022-01-28: qty 0.4

## 2022-01-28 MED ORDER — PREDNISONE 20 MG PO TABS: 20.0000 mg | ORAL_TABLET | Freq: Every day | ORAL | Status: DC

## 2022-01-28 MED ORDER — SODIUM CHLORIDE 0.9% IV SOLUTION: Freq: Once | INTRAVENOUS | Status: AC

## 2022-01-28 MED ORDER — PREDNISONE 5 MG PO TABS
10.0000 mg | ORAL_TABLET | Freq: Every day | ORAL | Status: DC
Start: 2022-03-11 — End: 2022-01-29

## 2022-01-28 MED ORDER — PREDNISONE 20 MG PO TABS
40.0000 mg | ORAL_TABLET | Freq: Every day | ORAL | Status: DC
  Administered 2022-01-28 – 2022-01-29 (×2): 40 mg via ORAL
  Filled 2022-01-28 (×2): qty 2

## 2022-01-28 MED ORDER — PREDNISONE 5 MG PO TABS: 30.0000 mg | ORAL_TABLET | Freq: Every day | ORAL | Status: DC

## 2022-01-28 NOTE — Progress Notes (Signed)
Upper extremity venous right study completed.  Preliminary results relayed to Many, DO and to RN on the floor.  See CV Proc for preliminary results report.   Darlin Coco, RDMS, RVT

## 2022-01-28 NOTE — Progress Notes (Addendum)
PROGRESS NOTE  Anmol Paschen JTT:[id number] DOB: 03-03-1981 DOA: 01/24/2022 PCP: Burnis Medin, MD  HPI/Recap of past 44 hours: 41 year old female with history of Crohn's disease not on treatment, follows with rheumatology outpatient, who presented to [facility] emergency room with near syncopal episode.  Was taking daily NSAIDs, naproxen, for 1 week, with presenting hemoglobin of 6.9, from 12.1 (06/21/21).  Hemoccult positive.  Seen by GI, post EGD on 01/25/2022 by Dr. Lyndel Safe, [facility], showed- Small antral erosion- Otherwise normal EGD.  Post colonoscopy on 01/26/2022 by Dr. Lyndel Safe, showed, Multiple anastomotic ulcers s/o Crohn's recurrence. Biopsied. The ulcers are likely the etiology of recent bleeding in the setting of naproxen.  Also showed non-bleeding internal hemorrhoids.  Hospital course complicated by right knee pain with effusion.  Admission x-ray of right knee showed severe osteoarthritis.  Consulted orthopedic surgery,  4 view x-ray of right knee showed Increased, moderately large knee joint effusion. No acute fracture identified. Tricompartmental osteoarthrosis, greatest in the patellofemoral compartment with an unchanged loose body.  Post right knee joint aspiration with more than 40 cc of benign-appearing clear yellow fluid obtained with joint fluid sent to the lab for stat analysis.  The joint fluid was nonseptic.  MRI right knee without significant findings.  No need to follow-up with orthopedic surgery, signed off, recommended follow-up with rheumatology.  The patient sees rheumatology outpatient for her Crohn's disease.  01/28/2022: The patient was seen and examined at her bedside.  She is on her menses and is concerned about losing more blood.  1 unit PRBCs ordered to be transfused.  Additionally she complains of right upper extremity edema, pain and erythema, involving her right forearm.    Addendum: Doppler ultrasound right upper extremity revealed superficial venous thrombosis.     Assessment/Plan: Principal Problem:   Symptomatic anemia Active Problems:   GIB (gastrointestinal bleeding)   History of Crohn's disease   Leukocytosis   Near syncope   Elevated d-dimer   Hypocalcemia  Acute blood loss anemia, symptomatic anemia In the setting of Crohn's flare. Presented with dizziness, lightheadedness, positive FOBT, drop in hemoglobin down to 6.9 from baseline of 12. Post colonoscopy on 01/26/2022 by Dr. Lyndel Safe, which showed, Multiple anastomotic ulcers s/o Crohn's recurrence. Biopsied. The ulcers are likely the etiology of recent bleeding in setting of naproxen- Also seen were non-bleeding internal hemorrhoids. Received Protonix drip. Switched to p.o. Protonix 40 mg daily on 01/28/2022. Post EGD on 01/25/2022 showing antral erosion. 1 unit PRBCs transfused for hemoglobin of 6.9, repeat 7.7. Currently on her menses, 1 unit PRBCs ordered 01/28/2022. Per GI, consider switching infliximab to Stelara or Entyvio or Rinvoq at follow-up.  Currently on prednisone taper as recommended by GI.  Large right knee effusion, post fall, POA Consulted orthopedic surgery,  4 view x-ray of right knee showed Increased, moderately large knee joint effusion. No acute fracture identified. Tricompartmental osteoarthrosis, greatest in the patellofemoral compartment with an unchanged loose body.  Post right knee joint aspiration with more than 40 cc of benign-appearing clear yellow fluid obtained with joint fluid sent to the lab for stat analysis. Pain control. MRI right knee showed: Tri-compartment osteoarthritis, worst in the patellofemoral compartment.  Large joint effusion with ossified joint body along the superolateral aspect measuring 1.6 cm.  Intra-substance degeneration without definitive meniscus tear.  Grade 1 medial collateral ligament sprain.   Crohn's disease with flare:  Prednisone taper as recommended by GI.  Right upper extremity superficial venous thrombosis Right upper  extremity Doppler ultrasound revealed  findings consistent with acute superficial vein thrombosis involving the right cephalic vein.  Due to recent symptomatic anemia with presyncope, GI bleed with hemoglobin of 6.9, patient on her menses, hematology/oncology Dr. Lindi Adie is consulted to assist with the management, whether or not to anticoagulate to avoid progression to DVT.  Started pharmacological DVT prophylaxis on 01/28/2022.    Will need to repeat right upper extremity duplex ultrasound in 2 to 4 weeks if not anticoagulated to assure resolution of superficial venous thrombosis and no progression to DVT in the setting of chronic inflammatory bowel disease.  Defer further decision to hematology.   Leukocytosis: Suspect reactive, WBC is downtrending without antibiotics.  Severe morbid obesity BMI 46 Recommend weight loss outpatient with regular physical activity and healthy dieting.     DVT prophylaxis: SCDs Start: 01/24/22 1849.  Subcu Lovenox daily 01/28/2022.     Code Status: Full Family Communication: None at bedside. Disposition Plan: Status is: Inpatient Remains inpatient appropriate because: Blood transfusion, inpatient procedures       Consultants:  Gastroenterology, Dr. Lyndel Safe Hematology, Dr. Lindi Adie   Procedures:  EGD  Colonoscopy   Antimicrobials:  None      Status is: Inpatient The patient requires at least 2 midnights for further evaluation and treatment of present condition.    Objective: Vitals:   01/26/22 2127 01/27/22 1115 01/27/22 2059 01/28/22 0456  BP: (!) 109/57 115/64 115/67 (!) 109/58  Pulse: (!) 104 99 93 92  Resp:  16 18 16   Temp: 98.2 F (36.8 C) 98.5 F (36.9 C) 98.4 F (36.9 C) 98.2 F (36.8 C)  TempSrc: Oral Oral Oral Oral  SpO2: 99% 95% 100% 99%  Weight:      Height:       No intake or output data in the 24 hours ending 01/28/22 1452  Filed Weights   01/24/22 1934 01/25/22 1106 01/26/22 1331  Weight: 119.8 kg 119.8 kg 120.2 kg     Exam:  General: 41 y.o. year-old female severely obese in no acute distress.  She is alert and oriented x3.   Cardiovascular: Regular rate and rhythm no rubs or gallops. Respiratory: Clear to auscultation no wheezes or rales. Abdomen: Soft bowel sounds present.   Musculoskeletal: Right knee with improved effusion post joint aspiration. Skin: Right forearm mild erythema and edema Psychiatry: Mood is appropriate for condition and setting.   Data Reviewed: CBC: Recent Labs  Lab 01/25/22 0632 01/25/22 1615 01/26/22 1025 01/27/22 0541 01/28/22 0616  WBC 16.9* 17.6* 19.4* 16.5* 15.4*  NEUTROABS  --  11.2* 12.9*  --   --   HGB 6.9* 8.5* 9.8* 7.7* 7.7*  HCT 22.0* 27.0* 31.9* 25.5* 25.1*  MCV 78.6* 82.6 83.7 85.6 83.4  PLT 277 263 307 271 109   Basic Metabolic Panel: Recent Labs  Lab 01/24/22 1223 01/24/22 1238 01/25/22 0328  NA 134*  --  136  K 3.7  --  3.8  CL 106  --  108  CO2 22  --  23  GLUCOSE 132*  --  97  BUN 39*  --  25*  CREATININE 0.56  --  0.63  CALCIUM 7.6*  --  7.4*  7.6*  MG  --  1.8  --    GFR: Estimated Creatinine Clearance: 119.7 mL/min (by C-G formula based on SCr of 0.63 mg/dL). Liver Function Tests: Recent Labs  Lab 01/25/22 0328  AST 14*  ALT 12  ALKPHOS 42  BILITOT 0.4  PROT 5.2*  ALBUMIN 2.3*  2.3*  No results for input(s): "LIPASE", "AMYLASE" in the last 168 hours. No results for input(s): "AMMONIA" in the last 168 hours. Coagulation Profile: No results for input(s): "INR", "PROTIME" in the last 168 hours. Cardiac Enzymes: Recent Labs  Lab 01/24/22 1238  CKTOTAL 267*   BNP (last 3 results) No results for input(s): "PROBNP" in the last 8760 hours. HbA1C: No results for input(s): "HGBA1C" in the last 72 hours. CBG: Recent Labs  Lab 01/24/22 1214  GLUCAP 125*   Lipid Profile: No results for input(s): "CHOL", "HDL", "LDLCALC", "TRIG", "CHOLHDL", "LDLDIRECT" in the last 72 hours. Thyroid Function Tests: No results for  input(s): "TSH", "T4TOTAL", "FREET4", "T3FREE", "THYROIDAB" in the last 72 hours. Anemia Panel: No results for input(s): "VITAMINB12", "FOLATE", "FERRITIN", "TIBC", "IRON", "RETICCTPCT" in the last 72 hours.  Urine analysis:    Component Value Date/Time   COLORURINE STRAW (A) 01/24/2022 1221   APPEARANCEUR CLEAR 01/24/2022 1221   LABSPEC 1.014 01/24/2022 1221   PHURINE 5.0 01/24/2022 1221   GLUCOSEU NEGATIVE 01/24/2022 [place] 04/08/2019 1645   HGBUR NEGATIVE 01/24/2022 1221   HGBUR 3+ 04/07/2010 0841   BILIRUBINUR NEGATIVE 01/24/2022 1221   BILIRUBINUR n 04/07/2014 1509   KETONESUR NEGATIVE 01/24/2022 1221   PROTEINUR NEGATIVE 01/24/2022 1221   UROBILINOGEN 0.2 04/08/2019 1645   NITRITE NEGATIVE 01/24/2022 1221   LEUKOCYTESUR NEGATIVE 01/24/2022 1221   Sepsis Labs: @LABRCNTIP (procalcitonin:4,lacticidven:4)  ) Recent Results (from the past 240 hour(s))  SARS Coronavirus 2 by RT PCR (hospital order, performed in [place] hospital lab) *cepheid single result test* Anterior Nasal Swab     Status: None   Collection Time: 01/24/22 12:50 PM   Specimen: Anterior Nasal Swab  Result Value Ref Range Status   SARS Coronavirus 2 by RT PCR NEGATIVE NEGATIVE Final    Comment: (NOTE) SARS-CoV-2 target nucleic acids are NOT DETECTED.  The SARS-CoV-2 RNA is generally detectable in upper and lower respiratory specimens during the acute phase of infection. The lowest concentration of SARS-CoV-2 viral copies this assay can detect is 250 copies / mL. A negative result does not preclude SARS-CoV-2 infection and should not be used as the sole basis for treatment or other patient management decisions.  A negative result may occur with improper specimen collection / handling, submission of specimen other than nasopharyngeal swab, presence of viral mutation(s) within the areas targeted by this assay, and inadequate number of viral copies (<250 copies / mL). A negative result  must be combined with clinical observations, patient history, and epidemiological information.  Fact Sheet for Patients:   https://www.patel.info/  Fact Sheet for Healthcare Providers: https://.com/  This test is not yet approved or  cleared by the [place] FDA and has been authorized for detection and/or diagnosis of SARS-CoV-2 by FDA under an Emergency Use Authorization (EUA).  This EUA will remain in effect (meaning this test can be used) for the duration of the COVID-19 declaration under Section 564(b)(1) of the Act, 21 U.S.C. section 360bbb-3(b)(1), unless the authorization is terminated or revoked sooner.  Performed at [facility], [place] [address], [place], [place] [address]   Body fluid culture w Gram Stain     Status: None (Preliminary result)   Collection Time: 01/27/22  2:27 PM   Specimen: Synovial Fluid  Result Value Ref Range Status   Specimen Description   Final    FLUID Performed at [facility] [address] [place], [place] [address]    Special Requests  Final    FLUID Performed at [facility], [place] [address], [place], [place] [address]    Gram Stain PENDING  Incomplete   Culture   Final    NO GROWTH < 24 HOURS Performed at [facility], [place] [address] [place], [place] [address]    Report Status PENDING  Incomplete      Studies: MR KNEE RIGHT WO CONTRAST  Result Date: 01/28/2022 CLINICAL DATA:  Knee trauma, internal derangement suspected, xray done EXAM: MRI OF THE RIGHT KNEE WITHOUT CONTRAST TECHNIQUE: Multiplanar, multisequence MR imaging of the knee was performed. No intravenous contrast was administered. COMPARISON:  Radiograph 01/27/2022 FINDINGS: Degraded image quality due to motion artifact and aliasing. MENISCI Medial: Intrasubstance degeneration.  No definitive tear. Lateral: Intrasubstance degeneration.  No definitive tear.  LIGAMENTS Cruciates: ACL and PCL are intact. Collaterals: Periligamentous edema along the medial collateral ligament, which is otherwise intact. Lateral collateral ligamentous complex is intact. CARTILAGE Patellofemoral: Concave lateral patellar facet with full-thickness cartilage loss. 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 Medial:  No focal chondral defect. Lateral: Intermediate grade partial in the cartilage loss along the anterolateral femoral condyle. JOINT: Large joint effusion with large ossified joint body along the superolateral aspect measuring 1.6 cm (series 14 image 11). POPLITEAL FOSSA: Miniscule Baker's cyst. EXTENSOR MECHANISM: Intact quadriceps tendon. Intact patellar tendon. BONES: Tricompartment osteophyte formation. No evidence of acute fracture. No aggressive osseous lesion. Other: Multiple ganglion cysts along the posterior joint capsule. 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 Large joint effusion with ossified joint body along the superolateral aspect measuring 1.6 cm. Intrasubstance degeneration without definitive meniscus tear. Grade 1 medial collateral ligament sprain. Electronically Signed   By: Maurine Simmering M.D.   On: 01/28/2022 09:49    Scheduled Meds:  sodium chloride   Intravenous Once   calcium-vitamin D  1 tablet Oral QAC breakfast   Chlorhexidine Gluconate Cloth  6 each Topical Q0600   lidocaine  1 patch Transdermal Q24H   loratadine  10 mg Oral Daily   pantoprazole  40 mg Oral Q0600   predniSONE  40 mg Oral Q breakfast   Followed by   Derrill Memo ON 02/11/2022] predniSONE  30 mg Oral Q breakfast   Followed by   Derrill Memo ON 02/25/2022] predniSONE  20 mg Oral Q breakfast   Followed by   Derrill Memo ON 03/11/2022] predniSONE  10 mg Oral Q breakfast    Continuous Infusions:  sodium chloride       LOS: 4 days     Kayleen Memos, MD [facility] Pager [phone]  If  7PM-7AM, please contact night-coverage [email or web address] Password [email or web address] 01/28/2022, 2:52 PM

## 2022-01-28 NOTE — Evaluation (Signed)
Occupational Therapy Evaluation Patient Details Name: [name] MRN: [id number] DOB: [birth year] Today's Date: 01/28/2022   History of Present Illness Patient is a 41 year old female who presented to the hosptial with with near syncopal episode. patient was noted to have Hgb of 6.9 and hemoccult positive. patient was seen by GI with EGD completed on 8/31. colonoscopy on 9/1 showed multiple anastomotic ulcers s/o Crohn's recurrence. patient was noted to have developed R knee pain with effusion during hospitalization.  Ortho was consulted on 9/2 with edema in R knee with aspiration preformed. fluid was no concerning for infection. PMH: crohn's disease, morbid obesity.   Clinical Impression   Patient is a 41 year old female who was admitted for above. Patient was living at home with husband and young child prior level. Patient was noted to have increased pain in RLE, and decreased functional activity tolerance impacting participation in ADLs. Patient needed min guard for transfers with RW with increased time and noted pain with movement. Patient would need increased caregiver support to transition home at this time. Patient would continue to benefit from skilled OT services at this time while admitted and after d/c to address noted deficits in order to improve overall safety and independence in ADLs.       Recommendations for follow up therapy are one component of a multi-disciplinary discharge planning process, led by the attending physician.  Recommendations may be updated based on patient status, additional functional criteria and insurance authorization.   Follow Up Recommendations  Home health OT    Assistance Recommended at Discharge Frequent or constant Supervision/Assistance  Patient can return home with the following A little help with walking and/or transfers;A little help with bathing/dressing/bathroom;Assistance with cooking/housework;Assist for transportation;Help with stairs or  ramp for entrance    Functional Status Assessment  Patient has had a recent decline in their functional status and demonstrates the ability to make significant improvements in function in a reasonable and predictable amount of time.  Equipment Recommendations  Other (comment) (total hip kit)    Recommendations for Other Services       Precautions / Restrictions Precautions Precautions: Fall Restrictions Weight Bearing Restrictions: No Other Position/Activity Restrictions: WBAT RLE per ortho on 9/2      Mobility Bed Mobility     General bed mobility comments: patient was in recliner at start and returned to the same        Balance Overall balance assessment: Mild deficits observed, not formally tested               ADL either performed or assessed with clinical judgement   ADL Overall ADL's : Needs assistance/impaired Eating/Feeding: Modified independent;Sitting   Grooming: Set up;Sitting   Upper Body Bathing: Set up;Sitting   Lower Body Bathing: Moderate assistance;Sit to/from stand;Sitting/lateral leans Lower Body Bathing Details (indicate cue type and reason): was unable to bring legs to lap or reach toes with leaning with incresed pain. patient was educated on reacher use and longhandled sponge. patient to look into objects. Upper Body Dressing : Set up;Sitting   Lower Body Dressing: Sit to/from stand;Sitting/lateral leans;Moderate assistance Lower Body Dressing Details (indicate cue type and reason): educated on reacher use and donning pants over RLE first. needed physical assist to get over feet on this date. Toilet Transfer: Minimal assistance;Ambulation;Rolling walker (2 wheels) Toilet Transfer Details (indicate cue type and reason): with increasd time and noted grimancing with movement Toileting- Clothing Manipulation and Hygiene: Minimal assistance;Sit to/from stand Electrical engineer  Details (indicate cue type and reason): with clothign  management mesh underwear             Vision Patient Visual Report: No change from baseline       Perception     Praxis      Pertinent Vitals/Pain Pain Assessment Pain Assessment: Faces Faces Pain Scale: Hurts even more Pain Location: R knee Pain Descriptors / Indicators: Discomfort, Grimacing Pain Intervention(s): Monitored during session, Repositioned     Hand Dominance     Extremity/Trunk Assessment Upper Extremity Assessment Upper Extremity Assessment: Overall WFL for tasks assessed;RUE deficits/detail RUE Deficits / Details: noted to have large area of redness on anterior forearm with outline drawn by nursing around area. patient noted to have edema in area as well.   Lower Extremity Assessment Lower Extremity Assessment: Defer to PT evaluation       Communication     Cognition Arousal/Alertness: Awake/alert Behavior During Therapy: WFL for tasks assessed/performed Overall Cognitive Status: Within Functional Limits for tasks assessed                      Home Living Family/patient expects to be discharged to:: Private residence Living Arrangements: Spouse/significant other;Children Available Help at Discharge: Family Type of Home: House Home Access: Level entry     Home Layout: Two level Alternate Level Stairs-Number of Steps: 13   Bathroom Shower/Tub: Gaffer;Tub/shower unit (tub shower on 1st floor)         Home Equipment: None          Prior Functioning/Environment Prior Level of Function : Independent/Modified Independent                        OT Problem List: Decreased knowledge of precautions;Decreased knowledge of use of DME or AE;Decreased activity tolerance;Pain      OT Treatment/Interventions: Self-care/ADL training;Patient/family education;DME and/or AE instruction;Therapeutic activities    OT Goals(Current goals can be found in the care plan section) Acute Rehab OT Goals Patient Stated Goal: to get R  arm redness figured out OT Goal Formulation: With patient Time For Goal Achievement: 02/11/22 Potential to Achieve Goals: Fair  OT Frequency: Min 2X/week    Co-evaluation PT/OT/SLP Co-Evaluation/Treatment: Yes Reason for Co-Treatment: To address functional/ADL transfers PT goals addressed during session: Mobility/safety with mobility OT goals addressed during session: ADL's and self-care      AM-PAC OT "6 Clicks" Daily Activity     Outcome Measure Help from another person eating meals?: None Help from another person taking care of personal grooming?: None Help from another person toileting, which includes using toliet, bedpan, or urinal?: A Little Help from another person bathing (including washing, rinsing, drying)?: A Little Help from another person to put on and taking off regular upper body clothing?: None Help from another person to put on and taking off regular lower body clothing?: A Lot 6 Click Score: 20   End of Session Equipment Utilized During Treatment: Gait belt;Rolling walker (2 wheels) Nurse Communication: Mobility status  Activity Tolerance: Patient tolerated treatment well Patient left: in chair;with call bell/phone within reach  OT Visit Diagnosis: Unsteadiness on feet (R26.81);Pain                Time: 1130-1158 OT Time Calculation (min): 28 min Charges:  OT General Charges $OT Visit: 1 Visit OT Evaluation $OT Eval Moderate Complexity: 1 Mod  Sherral Dirocco OTR/L, MS Acute Rehabilitation Department Office# [phone]   Marcellina Millin 01/28/2022, 1:47  PM

## 2022-01-28 NOTE — Evaluation (Signed)
Physical Therapy Evaluation Patient Details Name: [name] MRN: [id number] DOB: 1980-07-02 Today's Date: 01/28/2022  History of Present Illness  Patient is a 41 year old female who presented to the hosptial with with near syncopal episode. patient was noted to have Hgb of 6.9 and hemoccult positive. patient was seen by GI with EGD completed on 8/31. colonoscopy on 9/1 showed multiple anastomotic ulcers s/o Crohn's recurrence. patient was noted to have developed R knee pain with effusion during hospitalization.  Ortho was consulted on 9/2 with edema in R knee with aspiration preformed. fluid was no concerning for infection. PMH: crohn's disease, morbid obesity.  Clinical Impression  Pt admitted as above and presenting with functional mobility limitations 2* R knee pain/swelling and related difficulty tolerating WB.  This date, pt up with RW to ambulate limited distance demonstrating good stability with cues for sequence, posture and position from RW and standing rest breaks for task completion.  Pt would benefit from use of RW for home.     Recommendations for follow up therapy are one component of a multi-disciplinary discharge planning process, led by the attending physician.  Recommendations may be updated based on patient status, additional functional criteria and insurance authorization.  Follow Up Recommendations No PT follow up      Assistance Recommended at Discharge Set up Supervision/Assistance  Patient can return home with the following  Help with stairs or ramp for entrance;Assist for transportation;Assistance with cooking/housework    Equipment Recommendations Rolling walker (2 wheels)  Recommendations for Other Services       Functional Status Assessment       Precautions / Restrictions Precautions Precautions: Fall Precaution Comments: ROM within pt tolerance Restrictions Weight Bearing Restrictions: No Other Position/Activity Restrictions: WBAT RLE per ortho  on 9/2      Mobility  Bed Mobility               General bed mobility comments: patient was in recliner at start and returned to the same    Transfers Overall transfer level: Needs assistance Equipment used: Rolling walker (2 wheels) Transfers: Sit to/from Stand Sit to Stand: Min guard, Supervision           General transfer comment: cues for LE management and use of UEs to self assist    Ambulation/Gait Ambulation/Gait assistance: Min guard, Supervision Gait Distance (Feet): 64 Feet Assistive device: Rolling walker (2 wheels) Gait Pattern/deviations: Step-to pattern, Shuffle, Trunk flexed Gait velocity: decr     General Gait Details: cues for sequence, posture and position from RW.  one extended standing rest break required to complete task 2* fatigue  Stairs            Wheelchair Mobility    Modified Rankin (Stroke Patients Only)       Balance Overall balance assessment: Mild deficits observed, not formally tested                                           Pertinent Vitals/Pain Pain Assessment Pain Assessment: Faces Faces Pain Scale: Hurts even more Pain Location: R knee Pain Descriptors / Indicators: Discomfort, Grimacing Pain Intervention(s): Limited activity within patient's tolerance    Home Living Family/patient expects to be discharged to:: Private residence Living Arrangements: Spouse/significant other;Children Available Help at Discharge: Family Type of Home: House Home Access: Level entry     Alternate Level Stairs-Number of Steps: 13  Home Layout: Two level Home Equipment: None      Prior Function Prior Level of Function : Independent/Modified Independent                     Hand Dominance        Extremity/Trunk Assessment   Upper Extremity Assessment Upper Extremity Assessment: Overall WFL for tasks assessed RUE Deficits / Details: noted to have large area of redness on anterior forearm with  outline drawn by nursing around area. patient noted to have edema in area as well.    Lower Extremity Assessment Lower Extremity Assessment: RLE deficits/detail RLE Deficits / Details: 3/5 quad; AAROM at knee ltd to ~25 degrees 2* pain during functional activity RLE: Unable to fully assess due to pain    Cervical / Trunk Assessment Cervical / Trunk Assessment: Normal  Communication   Communication: No difficulties  Cognition Arousal/Alertness: Awake/alert Behavior During Therapy: WFL for tasks assessed/performed Overall Cognitive Status: Within Functional Limits for tasks assessed                                          General Comments      Exercises General Exercises - Lower Extremity Ankle Circles/Pumps: AROM, Both, 15 reps, Supine   Assessment/Plan    PT Assessment Patient needs continued PT services  PT Problem List Decreased strength;Decreased range of motion;Decreased activity tolerance;Decreased balance;Decreased mobility;Decreased knowledge of use of DME;Obesity;Pain       PT Treatment Interventions DME instruction;Gait training;Stair training;Functional mobility training;Therapeutic activities;Therapeutic exercise;Patient/family education    PT Goals (Current goals can be found in the Care Plan section)  Acute Rehab PT Goals Patient Stated Goal: Regain IND and return home to 41 yr old PT Goal Formulation: With patient Time For Goal Achievement: 02/11/22 Potential to Achieve Goals: Good    Frequency Min 3X/week     Co-evaluation PT/OT/SLP Co-Evaluation/Treatment: Yes Reason for Co-Treatment: To address functional/ADL transfers PT goals addressed during session: Mobility/safety with mobility OT goals addressed during session: ADL's and self-care       AM-PAC PT "6 Clicks" Mobility  Outcome Measure Help needed turning from your back to your side while in a flat bed without using bedrails?: None Help needed moving from lying on your back  to sitting on the side of a flat bed without using bedrails?: None Help needed moving to and from a bed to a chair (including a wheelchair)?: None Help needed standing up from a chair using your arms (e.g., wheelchair or bedside chair)?: A Little Help needed to walk in hospital room?: A Little Help needed climbing 3-5 steps with a railing? : A Little 6 Click Score: 21    End of Session Equipment Utilized During Treatment: Gait belt Activity Tolerance: Patient tolerated treatment well;Patient limited by pain;Patient limited by fatigue Patient left: in chair;with call bell/phone within reach Nurse Communication: Mobility status PT Visit Diagnosis: Difficulty in walking, not elsewhere classified (R26.2);Pain Pain - Right/Left: Right Pain - part of body: Knee    Time: 7544-9201 PT Time Calculation (min) (ACUTE ONLY): 30 min   Charges:   PT Evaluation $PT Eval Low Complexity: 1 Low          Melvin Pager [phone] Office [phone]   Blanche Gallien 01/28/2022, 4:11 PM

## 2022-01-29 DIAGNOSIS — D649 Anemia, unspecified: Secondary | ICD-10-CM | POA: Diagnosis not present

## 2022-01-29 LAB — CBC WITH DIFFERENTIAL/PLATELET
Abs Immature Granulocytes: 0.24 10*3/uL — ABNORMAL HIGH (ref 0.00–0.07)
Basophils Absolute: 0.1 10*3/uL (ref 0.0–0.1)
Basophils Relative: 0 %
Eosinophils Absolute: 0 10*3/uL (ref 0.0–0.5)
Eosinophils Relative: 0 %
HCT: 28.2 % — ABNORMAL LOW (ref 36.0–46.0)
Hemoglobin: 8.8 g/dL — ABNORMAL LOW (ref 12.0–15.0)
Immature Granulocytes: 1 %
Lymphocytes Relative: 17 %
Lymphs Abs: 3.6 10*3/uL (ref 0.7–4.0)
MCH: 26.2 pg (ref 26.0–34.0)
MCHC: 31.2 g/dL (ref 30.0–36.0)
MCV: 83.9 fL (ref 80.0–100.0)
Monocytes Absolute: 1.9 10*3/uL — ABNORMAL HIGH (ref 0.1–1.0)
Monocytes Relative: 9 %
Neutro Abs: 15.4 10*3/uL — ABNORMAL HIGH (ref 1.7–7.7)
Neutrophils Relative %: 73 %
Platelets: 361 10*3/uL (ref 150–400)
RBC: 3.36 MIL/uL — ABNORMAL LOW (ref 3.87–5.11)
RDW: 19.3 % — ABNORMAL HIGH (ref 11.5–15.5)
WBC: 21.2 10*3/uL — ABNORMAL HIGH (ref 4.0–10.5)
nRBC: 0.3 % — ABNORMAL HIGH (ref 0.0–0.2)

## 2022-01-29 LAB — TYPE AND SCREEN
ABO/RH(D): O POS
Antibody Screen: NEGATIVE
Unit division: 0

## 2022-01-29 LAB — BPAM RBC
Blood Product Expiration Date: 202310042359
ISSUE DATE / TIME: 202309031638
Unit Type and Rh: 5100

## 2022-01-29 MED ORDER — PREDNISONE 20 MG PO TABS: 20.0000 mg | ORAL_TABLET | Freq: Every day | ORAL | 0 refills | Status: AC

## 2022-01-29 MED ORDER — PANTOPRAZOLE SODIUM 40 MG PO TBEC
40.0000 mg | DELAYED_RELEASE_TABLET | Freq: Every day | ORAL | 0 refills | Status: DC
Start: 2022-01-30 — End: 2022-04-12

## 2022-01-29 MED ORDER — LIDOCAINE 5 % EX PTCH
1.0000 | MEDICATED_PATCH | CUTANEOUS | 0 refills | Status: DC
Start: 2022-01-30 — End: 2022-02-01

## 2022-01-29 MED ORDER — PREDNISONE 10 MG PO TABS: 10.0000 mg | ORAL_TABLET | Freq: Every day | ORAL | 0 refills | Status: AC

## 2022-01-29 MED ORDER — PREDNISONE 20 MG PO TABS: 40.0000 mg | ORAL_TABLET | Freq: Every day | ORAL | 0 refills | Status: AC

## 2022-01-29 MED ORDER — PREDNISONE 10 MG PO TABS: 30.0000 mg | ORAL_TABLET | Freq: Every day | ORAL | 0 refills | Status: DC

## 2022-01-29 NOTE — Discharge Summary (Signed)
Physician Discharge Summary  [name] IWO:[id number] DOB: [birth year] DOA: 01/24/2022  PCP: Burnis Medin, MD  Admit date: 01/24/2022 Discharge date: 01/29/2022  Admitted From: Home Disposition: Home  Recommendations for Outpatient Follow-up:  Follow up with PCP in 1-2 weeks Please obtain BMP/CBC in one week Please follow-up with your rheumatology  Home Health: N/A Equipment/Devices: N/A  Discharge Condition: Stable CODE STATUS: Full code Diet recommendation: Regular diet  Discharge summary:  41 year old with history of Crohn's disease status post colonic resection and currently on infliximab presented to the ER with near syncopal episode, also regularly using NSAIDs.  Hemoglobin of 6.9 from recent hemoglobin of 12.1.  Hemoccult positive.  Admitted to the hospital with GI bleeding and underwent upper GI endoscopy that was essentially negative and showed a small antral erosion, underwent colonoscopy showed multiple anastomotic ulcers with signs of Crohn's recurrence. Received total 3 units of PRBC with stabilization of hemoglobin. Underwent EGD and colonoscopy as above, currently no evidence of ongoing bleeding.  Patient was started on prednisone prolonged taper as below and will have outpatient GI follow-up.  Hospital course complicated by right knee pain knee effusion, x-ray showed severe osteoarthritis.  MRI with no evidence of complications.  Seen by orthopedics, arthrocentesis that was transudate with no evidence of infection.  Symptomatically treated with improvement.  Continue local therapy, avoid NSAIDs.  Right upper extremity DVT studies revealed superficial venous thrombosis, recent history of GI bleed.  No indication for treatment at this time.  Local therapies with ice compression. Patient has significant leukocytosis with use of high-dose steroids, no evidence of bacterial infection on her GI tract as well as on her right knee.  Plan: Adequately improved.   Discharging home with prednisone taper.  Patient will be seen at the GI office for follow-up.  Patient will call her rheumatologist for follow-up.  Discharge Diagnoses:  Principal Problem:   Symptomatic anemia Active Problems:   GIB (gastrointestinal bleeding)   History of Crohn's disease   Leukocytosis   Near syncope   Elevated d-dimer   Hypocalcemia    Discharge Instructions  Discharge Instructions     Diet - low sodium heart healthy   Complete by: As directed    Increase activity slowly   Complete by: As directed       Allergies as of 01/29/2022   No Known Allergies      Medication List     STOP taking these medications    amoxicillin-clavulanate 875-125 MG tablet Commonly known as: AUGMENTIN   naproxen 500 MG tablet Commonly known as: NAPROSYN       TAKE these medications    Calcium/Vitamin D 600-400 MG-UNIT Tabs Take 1 tablet by mouth daily.   fexofenadine 180 MG tablet Commonly known as: ALLEGRA Take 180 mg by mouth daily.   fluticasone 50 MCG/ACT nasal spray Commonly known as: FLONASE Place 1 spray into both nostrils daily as needed for allergies.   lidocaine 5 % Commonly known as: LIDODERM Place 1 patch onto the skin daily. Remove & Discard patch within 12 hours or as directed by MD Start taking on: January 30, 2022   metFORMIN 500 MG tablet Commonly known as: GLUCOPHAGE Take 1 tablet (500 mg total) by mouth 2 (two) times daily with a meal.   nystatin-triamcinolone ointment Commonly known as: MYCOLOG Apply 1 Application topically 2 (two) times daily as needed (rash).   pantoprazole 40 MG tablet Commonly known as: PROTONIX Take 1 tablet (40 mg total) by mouth daily at 6 (  six) AM. Start taking on: January 30, 2022   predniSONE 20 MG tablet Commonly known as: DELTASONE Take 2 tablets (40 mg total) by mouth daily with breakfast for 13 days. Start taking on: January 30, 2022   predniSONE 10 MG tablet Commonly known as:  DELTASONE Take 3 tablets (30 mg total) by mouth daily with breakfast for 14 days. Start taking on: February 13, 2022   predniSONE 20 MG tablet Commonly known as: DELTASONE Take 1 tablet (20 mg total) by mouth daily with breakfast for 14 days. Start taking on: February 28, 2022   predniSONE 10 MG tablet Commonly known as: DELTASONE Take 1 tablet (10 mg total) by mouth daily with breakfast for 14 days. Start taking on: March 15, 2022   prenatal multivitamin Tabs tablet Take 1 tablet by mouth daily at 12 noon.   vitamin D3 50 MCG (2000 UT) Caps Take 2,000 Units by mouth daily.               Durable Medical Equipment  (From admission, onward)           Start     Ordered   01/28/22 1606  For home use only DME Walker rolling  Once       Question Answer Comment  Walker: With [place]   Patient needs a walker to treat with the following condition Difficulty in walking, not elsewhere classified      01/28/22 1607            Follow-up Information     Willia Craze, NP Follow up on 02/28/2022.   Specialty: Gastroenterology Why: at 8:30 am Contact information: [place] [address] [phone]                No Known Allergies  Consultations: Gastroenterology Orthopedics    Procedures/Studies: VAS [name] UPPER EXTREMITY VENOUS DUPLEX  Result Date: 01/28/2022 UPPER VENOUS STUDY  Patient Name:  [name]  Date of Exam:   01/28/2022 Medical Rec #: [id number]           Accession #:    [id number] Date of Birth: 06-06-1980          Patient Gender: F Patient Age:   41 years Exam Location:  [facility] Procedure:      VAS [name] UPPER EXTREMITY VENOUS DUPLEX Referring Phys: Irene Pap --------------------------------------------------------------------------------  Indications: Erythema, swelling, s/p IV Comparison Study: 01-25-2022 Prior bilateral lower extremity venous was negative                   for DVT. Performing  Technologist: Darlin Coco RDMS, RVT  Examination Guidelines: A complete evaluation includes B-mode imaging, spectral Doppler, color Doppler, and power Doppler as needed of all accessible portions of each vessel. Bilateral testing is considered an integral part of a complete examination. Limited examinations for reoccurring indications may be performed as noted.  Right Findings: +----------+------------+---------+-----------+----------+--------------------+ RIGHT     CompressiblePhasicitySpontaneousProperties      Summary        +----------+------------+---------+-----------+----------+--------------------+ IJV           Full       Yes       Yes                                   +----------+------------+---------+-----------+----------+--------------------+ Subclavian  Yes       Yes                                   +----------+------------+---------+-----------+----------+--------------------+ Axillary      Full       Yes       Yes                                   +----------+------------+---------+-----------+----------+--------------------+ Brachial      Full                                                       +----------+------------+---------+-----------+----------+--------------------+ Radial        Full                                                       +----------+------------+---------+-----------+----------+--------------------+ Ulnar         Full                                                       +----------+------------+---------+-----------+----------+--------------------+ Cephalic      None       No        No                Acute-AC to distal                                                            forearm        +----------+------------+---------+-----------+----------+--------------------+ Basilic       Full                                                        +----------+------------+---------+-----------+----------+--------------------+  Left Findings: +----------+------------+---------+-----------+----------+-------+ LEFT      CompressiblePhasicitySpontaneousPropertiesSummary +----------+------------+---------+-----------+----------+-------+ Subclavian               Yes       Yes                      +----------+------------+---------+-----------+----------+-------+  Summary:  Right: No evidence of deep vein thrombosis in the upper extremity. Findings consistent with acute superficial vein thrombosis involving the right cephalic vein.  Left: No evidence of thrombosis in the subclavian.  *See table(s) above for measurements and observations.  Diagnosing physician: Monica Martinez MD Electronically signed by Monica Martinez MD on 01/28/2022 at 3:45:37 PM.    Final    MR KNEE RIGHT WO CONTRAST  Result Date: 01/28/2022 CLINICAL DATA:  Knee trauma,  internal derangement suspected, xray done EXAM: MRI OF THE RIGHT KNEE WITHOUT CONTRAST TECHNIQUE: Multiplanar, multisequence MR imaging of the knee was performed. No intravenous contrast was administered. COMPARISON:  Radiograph 01/27/2022 FINDINGS: Degraded image quality due to motion artifact and aliasing. MENISCI Medial: Intrasubstance degeneration.  No definitive tear. Lateral: Intrasubstance degeneration.  No definitive tear. LIGAMENTS Cruciates: ACL and PCL are intact. Collaterals: Periligamentous edema along the medial collateral ligament, which is otherwise intact. Lateral collateral ligamentous complex is intact. CARTILAGE Patellofemoral: Concave lateral patellar facet with full-thickness cartilage loss. 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 Medial:  No focal chondral defect. Lateral: Intermediate grade partial in the cartilage loss along the anterolateral femoral condyle. JOINT: Large joint effusion with large ossified joint body along the superolateral aspect measuring 1.6 cm (series 14  image 11). POPLITEAL FOSSA: Miniscule Baker's cyst. EXTENSOR MECHANISM: Intact quadriceps tendon. Intact patellar tendon. BONES: Tricompartment osteophyte formation. No evidence of acute fracture. No aggressive osseous lesion. Other: Multiple ganglion cysts along the posterior joint capsule. 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 Large joint effusion with ossified joint body along the superolateral aspect measuring 1.6 cm. Intrasubstance degeneration without definitive meniscus tear. Grade 1 medial collateral ligament sprain. Electronically Signed   By: Maurine Simmering M.D.   On: 01/28/2022 09:49   DG Knee 4 Views W/Patella Right  Result Date: 01/27/2022 CLINICAL DATA:  Anterior and medial right knee pain since a fall 2 days ago. EXAM: RIGHT KNEE - COMPLETE 4+ VIEW COMPARISON:  Right knee radiographs 01/25/2022 FINDINGS: No acute fracture or dislocation is identified. There is a moderately large knee joint effusion which has greatly increased in size since the prior study. Moderate joint space narrowing and moderate marginal osteophytosis involve the patellofemoral compartment. Milder marginal osteophytosis is noted in the medial and lateral compartments where joint space width is preserved. A 2 cm loose body again projects in the region of the lateral patellofemoral joint space. IMPRESSION: 1. Increased, moderately large knee joint effusion. No acute fracture identified. 2. Tricompartmental osteoarthrosis, greatest in the patellofemoral compartment with an unchanged loose body. Electronically Signed   By: Logan Bores M.D.   On: 01/27/2022 11:15   ECHOCARDIOGRAM COMPLETE  Result Date: 01/25/2022    ECHOCARDIOGRAM REPORT   Patient Name:   QUIDA GLASSER Date of Exam: 01/25/2022 Medical Rec #:  [id number]          Height:       62.5 in Accession #:    [id number]         Weight:       264.1 lb Date of Birth:  [birth year]          BSA:          2.164 m Patient Age:    89 years           BP:           124/57 mmHg Patient Gender: F                  HR:           79 bpm. Exam Location:  Inpatient Procedure: 2D Echo Indications:    Syncope  History:        Patient has no prior history of Echocardiogram examinations.                 Signs/Symptoms:Syncope; Risk Factors:Non-Smoker.  Sonographer:    Harvie Junior Referring Phys: [id number] Jonnie Finner  Sonographer Comments:  Patient is obese. Image acquisition challenging due to patient body habitus. IMPRESSIONS  1. Left ventricular ejection fraction, by estimation, is 60 to 65%. Left ventricular ejection fraction by 2D MOD biplane is 62.8 %. Left ventricular ejection fraction by PLAX is 61 %. The left ventricle has normal function. The left ventricle has no regional wall motion abnormalities. Left ventricular diastolic parameters were normal.  2. Right ventricular systolic function is normal. The right ventricular size is normal. There is normal pulmonary artery systolic pressure.  3. The mitral valve is normal in structure. Trivial mitral valve regurgitation. No evidence of mitral stenosis.  4. The aortic valve is tricuspid. Aortic valve regurgitation is not visualized. No aortic stenosis is present.  5. The inferior vena cava is normal in size with greater than 50% respiratory variability, suggesting right atrial pressure of 3 mmHg. FINDINGS  Left Ventricle: Left ventricular ejection fraction, by estimation, is 60 to 65%. Left ventricular ejection fraction by PLAX is 61 %. Left ventricular ejection fraction by 2D MOD biplane is 62.8 %. The left ventricle has normal function. The left ventricle has no regional wall motion abnormalities. The left ventricular internal cavity size was normal in size. There is no left ventricular hypertrophy. Left ventricular diastolic parameters were normal. Normal left ventricular filling pressure. Right Ventricle: The right ventricular size is normal. No  increase in right ventricular wall thickness. Right ventricular systolic function is normal. There is normal pulmonary artery systolic pressure. The tricuspid regurgitant velocity is 1.89 m/s, and  with an assumed right atrial pressure of 3 mmHg, the estimated right ventricular systolic pressure is 27.7 mmHg. Left Atrium: Left atrial size was normal in size. Right Atrium: Right atrial size was normal in size. Pericardium: There is no evidence of pericardial effusion. Mitral Valve: The mitral valve is normal in structure. Mild mitral annular calcification. Trivial mitral valve regurgitation. No evidence of mitral valve stenosis. Tricuspid Valve: The tricuspid valve is normal in structure. Tricuspid valve regurgitation is not demonstrated. No evidence of tricuspid stenosis. Aortic Valve: The aortic valve is tricuspid. Aortic valve regurgitation is not visualized. No aortic stenosis is present. Aortic valve mean gradient measures 5.0 mmHg. Aortic valve peak gradient measures 7.8 mmHg. Aortic valve area, by VTI measures 2.17 cm. Pulmonic Valve: The pulmonic valve was normal in structure. Pulmonic valve regurgitation is not visualized. No evidence of pulmonic stenosis. Aorta: The aortic root is normal in size and structure. Venous: The inferior vena cava is normal in size with greater than 50% respiratory variability, suggesting right atrial pressure of 3 mmHg. IAS/Shunts: No atrial level shunt detected by color flow Doppler.  LEFT VENTRICLE PLAX 2D                        Biplane EF (MOD) LV EF:         Left            LV Biplane EF:   Left                ventricular                      ventricular                ejection                         ejection  fraction by                      fraction by                PLAX is 61                       2D MOD                %.                               biplane is LVIDd:         4.60 cm                          62.8 %. LVIDs:         3.10 cm LV PW:          0.80 cm         Diastology LV IVS:        0.90 cm         LV e' medial:    11.70 cm/s LVOT diam:     2.00 cm         LV E/e' medial:  7.4 LV SV:         65              LV e' lateral:   16.80 cm/s LV SV Index:   30              LV E/e' lateral: 5.1 LVOT Area:     3.14 cm  LV Volumes (MOD) LV vol d, MOD    93.1 ml A2C: LV vol d, MOD    111.0 ml A4C: LV vol s, MOD    36.4 ml A2C: LV vol s, MOD    40.4 ml A4C: LV SV MOD A2C:   56.7 ml LV SV MOD A4C:   111.0 ml LV SV MOD BP:    67.2 ml RIGHT VENTRICLE RV Basal diam:  3.20 cm RV Mid diam:    2.40 cm LEFT ATRIUM             Index        RIGHT ATRIUM           Index LA diam:        3.50 cm 1.62 cm/m   RA Area:     11.10 cm LA Vol (A2C):   29.3 ml 13.54 ml/m  RA Volume:   21.90 ml  10.12 ml/m LA Vol (A4C):   19.9 ml 9.19 ml/m LA Biplane Vol: 25.6 ml 11.83 ml/m  AORTIC VALVE                     PULMONIC VALVE AV Area (Vmax):    2.19 cm      PV Vmax:       1.05 m/s AV Area (Vmean):   2.22 cm      PV Peak grad:  4.4 mmHg AV Area (VTI):     2.17 cm AV Vmax:           140.00 cm/s AV Vmean:          102.000 cm/s AV VTI:            0.300 m AV Peak Grad:      7.8  mmHg AV Mean Grad:      5.0 mmHg LVOT Vmax:         97.50 cm/s LVOT Vmean:        72.000 cm/s LVOT VTI:          0.207 m LVOT/AV VTI ratio: 0.69  AORTA Ao Root diam: 2.70 cm Ao Asc diam:  2.60 cm MITRAL VALVE               TRICUSPID VALVE MV Area (PHT): 2.95 cm    TR Peak grad:   14.3 mmHg MV Decel Time: 257 msec    TR Vmax:        189.00 cm/s MR Peak grad: 44.6 mmHg MR Vmax:      334.00 cm/s  SHUNTS MV E velocity: 86.50 cm/s  Systemic VTI:  0.21 m MV A velocity: 55.70 cm/s  Systemic Diam: 2.00 cm MV E/A ratio:  1.55 Skeet Latch MD Electronically signed by Skeet Latch MD Signature Date/Time: 01/25/2022/6:08:12 PM    Final    DG Knee 1-2 Views Right  Result Date: 01/25/2022 CLINICAL DATA:  Right knee pain for 1-2 days.  No known injury. EXAM: RIGHT KNEE - 1-2 VIEW COMPARISON:  None Available.  FINDINGS: Severe patellofemoral joint space narrowing with moderate peripheral degenerative osteophytes. Small joint effusion. There is a 2.3 cm mineralized loose body just lateral to the superior aspect of the lateral femoral condyle, likely within the lateral patellofemoral joint space. Moderate peripheral lateral femoral condyle and mild peripheral lateral tibial plateau degenerative osteophytes. The medial and lateral compartment joint spaces are maintained. No acute fracture or dislocation. IMPRESSION: 1. Severe patellofemoral osteoarthritis. 2. Superolateral knee loose body measuring up to 2.3 cm. Electronically Signed   By: Yvonne Kendall M.D.   On: 01/25/2022 17:50   DG Ankle 2 Views Left  Result Date: 01/25/2022 CLINICAL DATA:  Left ankle pain for 1-2 days. EXAM: LEFT ANKLE - 2 VIEW COMPARISON:  None Available. FINDINGS: Normal bone mineralization. The ankle mortise is symmetric and intact. Minimal distal medial malleolar degenerative spurring. No acute fracture is seen. No dislocation. Joint spaces are preserved. IMPRESSION: No significant osteoarthritis.  No acute fracture. Electronically Signed   By: Yvonne Kendall M.D.   On: 01/25/2022 17:49   CT Angio Abd/Pel w/ and/or w/o  Result Date: 01/25/2022 CLINICAL DATA:  Concern for GI bleeding. History of ileal cecal Crohn's disease. EXAM: CT ANGIOGRAPHY ABDOMEN AND PELVIS WITH CONTRAST AND WITHOUT CONTRAST TECHNIQUE: Multidetector CT imaging of the abdomen and pelvis was performed using the standard protocol during bolus administration of intravenous contrast. Multiplanar reconstructed images and MIPs were obtained and reviewed to evaluate the vascular anatomy. RADIATION DOSE REDUCTION: This exam was performed according to the departmental dose-optimization program which includes automated exposure control, adjustment of the mA and/or kV according to patient size and/or use of iterative reconstruction technique. CONTRAST:  113m OMNIPAQUE IOHEXOL 350  MG/ML SOLN COMPARISON:  08/25/2018 FINDINGS: VASCULAR Aorta: Intact aorta. No acute aortic process, aneurysm, dissection, or surrounding inflammatory change. No retroperitoneal hemorrhage or hematoma. Celiac: Widely patent origin including its branches. No active bleeding within the GI tract throughout the celiac territory. SMA: Widely patent origin including its central mesenteric branches. No active arterial GI bleeding in the SMA vascular territory. Renals: Single renal arteries appear widely patent. IMA: IMA origin remains patent off the distal aorta including its branches. No active arterial bleeding in the IMA vascular territory. Inflow: Patent without evidence of aneurysm, dissection, vasculitis or significant stenosis.  Proximal Outflow: Bilateral common femoral and visualized portions of the superficial and profunda femoral arteries are patent without evidence of aneurysm, dissection, vasculitis or significant stenosis. Veins: Venous phase imaging not performed Review of the MIP images confirms the above findings. NON-VASCULAR Lower chest: Scattered bibasilar atelectasis. No pericardial pleural effusion. Prominent heart size. Hepatobiliary: No focal liver abnormality is seen. No gallstones, gallbladder wall thickening, or biliary dilatation. Pancreas: Unremarkable. No pancreatic ductal dilatation or surrounding inflammatory changes. Spleen: Normal in size without focal abnormality. Adrenals/Urinary Tract: Adrenal glands are unremarkable. Kidneys are normal, without renal calculi, focal lesion, or hydronephrosis. Bladder is collapsed by Foley catheter. Stomach/Bowel: Negative for bowel obstruction, significant dilatation, ileus, or free air. Small lateral right lower quadrant abdominal wall hernia containing a fluid distended loop of small bowel but without obstruction or incarceration. Similar appearance. Postop changes of the cecum again noted. No free fluid, fluid collection, hemorrhage, hematoma, abscess  or ascites. Lymphatic: No bulky adenopathy. Reproductive: Uterine fibroids again noted. No adnexal abnormality. No free fluid, fluid collection, or hemorrhage in the pelvis. Other: Midline fat containing ventral hernia above the umbilicus. This is slightly enlarged. Previous left lower quadrant spigelian abdominal wall hernia no longer evident, suspect surgical repair. Musculoskeletal: No acute or significant osseous finding. IMPRESSION: VASCULAR Negative for acute arterial GI bleeding in the abdomen or pelvis. No other acute vascular process. NON-VASCULAR No other acute intra-abdominal or pelvic finding by CTA. Slight enlargement of the midline fat containing ventral hernia. Stable chronic and postoperative findings as above. Electronically Signed   By: Jerilynn Mages.  Shick M.D.   On: 01/25/2022 15:22   VAS [name] LOWER EXTREMITY VENOUS (DVT)  Result Date: 01/25/2022  Lower Venous DVT Study Patient Name:  ARLEEN BAR  Date of Exam:   01/25/2022 Medical Rec #: [id number]           Accession #:    [id number] Date of Birth: [birth year]          Patient Gender: F Patient Age:   74 years Exam Location:  [facility] Procedure:      VAS [name] LOWER EXTREMITY VENOUS (DVT) Referring Phys: Cherylann Ratel --------------------------------------------------------------------------------  Indications: Elevated Ddimer.  Risk Factors: None identified. Limitations: Body habitus and poor ultrasound/tissue interface. Comparison Study: No prior studies. Performing Technologist: Oliver Hum RVT  Examination Guidelines: A complete evaluation includes B-mode imaging, spectral Doppler, color Doppler, and power Doppler as needed of all accessible portions of each vessel. Bilateral testing is considered an integral part of a complete examination. Limited examinations for reoccurring indications may be performed as noted. The reflux portion of the exam is performed with the patient in reverse Trendelenburg.   +---------+---------------+---------+-----------+----------+--------------+ RIGHT    CompressibilityPhasicitySpontaneityPropertiesThrombus Aging +---------+---------------+---------+-----------+----------+--------------+ CFV      Full           Yes      Yes                                 +---------+---------------+---------+-----------+----------+--------------+ SFJ      Full                                                        +---------+---------------+---------+-----------+----------+--------------+ FV Prox  Full                                                        +---------+---------------+---------+-----------+----------+--------------+  FV Mid   Full                                                        +---------+---------------+---------+-----------+----------+--------------+ FV DistalFull           Yes      Yes                                 +---------+---------------+---------+-----------+----------+--------------+ PFV      Full                                                        +---------+---------------+---------+-----------+----------+--------------+ POP      Full           Yes      Yes                                 +---------+---------------+---------+-----------+----------+--------------+ PTV      Full                                                        +---------+---------------+---------+-----------+----------+--------------+ PERO     Full                                                        +---------+---------------+---------+-----------+----------+--------------+   +---------+---------------+---------+-----------+----------+--------------+ LEFT     CompressibilityPhasicitySpontaneityPropertiesThrombus Aging +---------+---------------+---------+-----------+----------+--------------+ CFV      Full           Yes      Yes                                  +---------+---------------+---------+-----------+----------+--------------+ SFJ      Full                                                        +---------+---------------+---------+-----------+----------+--------------+ FV Prox  Full                                                        +---------+---------------+---------+-----------+----------+--------------+ FV Mid   Full                                                        +---------+---------------+---------+-----------+----------+--------------+  FV DistalFull                                                        +---------+---------------+---------+-----------+----------+--------------+ PFV      Full                                                        +---------+---------------+---------+-----------+----------+--------------+ POP      Full           Yes      Yes                                 +---------+---------------+---------+-----------+----------+--------------+ PTV      Full                                                        +---------+---------------+---------+-----------+----------+--------------+ PERO     Full                                                        +---------+---------------+---------+-----------+----------+--------------+     Summary: RIGHT: - There is no evidence of deep vein thrombosis in the lower extremity. However, portions of this examination were limited- see technologist comments above.  - No cystic structure found in the popliteal fossa.  LEFT: - There is no evidence of deep vein thrombosis in the lower extremity. However, portions of this examination were limited- see technologist comments above.  - No cystic structure found in the popliteal fossa.  *See table(s) above for measurements and observations. Electronically signed by Orlie Pollen on 01/25/2022 at 1:57:49 PM.    Final    CT Angio Chest PE W and/or Wo Contrast  Result Date: 01/24/2022 CLINICAL  DATA:  Syncope, positive D-dimer EXAM: CT ANGIOGRAPHY CHEST WITH CONTRAST TECHNIQUE: Multidetector CT imaging of the chest was performed using the standard protocol during bolus administration of intravenous contrast. Multiplanar CT image reconstructions and MIPs were obtained to evaluate the vascular anatomy. RADIATION DOSE REDUCTION: This exam was performed according to the departmental dose-optimization program which includes automated exposure control, adjustment of the mA and/or kV according to patient size and/or use of iterative reconstruction technique. CONTRAST:  169m OMNIPAQUE IOHEXOL 350 MG/ML SOLN COMPARISON:  Chest radiograph done earlier today FINDINGS: Cardiovascular: There is homogeneous enhancement in thoracic aorta. There are no intraluminal filling defects in central pulmonary artery branches. Evaluation of small peripheral branches he is limited by motion artifacts. Mediastinum/Nodes: No significant lymphadenopathy is seen. Lungs/Pleura: There is no focal pulmonary consolidation. There is no pleural effusion or pneumothorax. Upper Abdomen: Unremarkable. Musculoskeletal: Unremarkable. Review of the MIP images confirms the above findings. IMPRESSION: There is no evidence of central pulmonary artery embolism. There is no evidence of thoracic aortic dissection. There is no focal pulmonary consolidation. Electronically  Signed   By: Elmer Picker M.D.   On: 01/24/2022 14:59   CT Head Wo Contrast  Result Date: 01/24/2022 CLINICAL DATA:  Provided history: Headache, syncope. Additional history provided: Head trauma. EXAM: CT HEAD WITHOUT CONTRAST TECHNIQUE: Contiguous axial images were obtained from the base of the skull through the vertex without intravenous contrast. RADIATION DOSE REDUCTION: This exam was performed according to the departmental dose-optimization program which includes automated exposure control, adjustment of the mA and/or kV according to patient size and/or use of iterative  reconstruction technique. COMPARISON:  None Available. FINDINGS: Brain: Cerebral volume is normal. There is no acute intracranial hemorrhage. No demarcated cortical infarct. No extra-axial fluid collection. No evidence of an intracranial mass. No midline shift. Vascular: No hyperdense vessel. Skull: No fracture or aggressive osseous lesion. Sinuses/Orbits: No mass or acute finding within the imaged orbits. No significant paranasal sinus disease. Other: Right parietal scalp hematoma. IMPRESSION: No evidence of acute intracranial abnormality. Right parietal scalp hematoma. Electronically Signed   By: Kellie Simmering D.O.   On: 01/24/2022 14:59   DG Chest 1 View  Result Date: 01/24/2022 CLINICAL DATA:  Syncope EXAM: CHEST  1 VIEW COMPARISON:  Chest radiograph 11/09/2010 FINDINGS: The cardiomediastinal silhouette is normal. Lung volumes are significantly diminished. There is no focal consolidation or pulmonary edema. There is no pleural effusion or pneumothorax There is no acute osseous abnormality. IMPRESSION: Low lung volumes. Otherwise, no radiographic evidence of acute cardiopulmonary process. Electronically Signed   By: Valetta Mole M.D.   On: 01/24/2022 12:57   (Echo, Carotid, EGD, Colonoscopy, ERCP)    Subjective: Patient seen and examined.  Mild pain persist right knee.  Afebrile.  Able to tolerate diet.  She had 1 bowel movement that was loose brown stool.  Denies any abdominal pain or distention.   Discharge Exam: Vitals:   01/28/22 2031 01/29/22 0454  BP: 118/77 136/60  Pulse: (!) 108 96  Resp: 16 17  Temp: 98.1 F (36.7 C) 98.1 F (36.7 C)  SpO2: 98% 96%   Vitals:   01/28/22 1628 01/28/22 1703 01/28/22 2031 01/29/22 0454  BP: (!) 128/58 (!) 140/77 118/77 136/60  Pulse: (!) 107 (!) 108 (!) 108 96  Resp: 16 18 16 17   Temp: 98.1 F (36.7 C) 98.3 F (36.8 C) 98.1 F (36.7 C) 98.1 F (36.7 C)  TempSrc: Oral  Oral Oral  SpO2: 100%  98% 96%  Weight:      Height:        General: Pt  is alert, awake, not in acute distress Sitting in chair. Cardiovascular: RRR, S1/S2 +, no rubs, no gallops Respiratory: CTA bilaterally, no wheezing, no rhonchi Abdominal: Soft, NT, ND, bowel sounds + Extremities: no edema, no cyanosis Right knee with some tenderness on range of motion, no redness or erythema.    The results of significant diagnostics from this hospitalization (including imaging, microbiology, ancillary and laboratory) are listed below for reference.     Microbiology: Recent Results (from the past 240 hour(s))  SARS Coronavirus 2 by RT PCR (hospital order, performed in [facility] hospital lab) *cepheid single result test* Anterior Nasal Swab     Status: None   Collection Time: 01/24/22 12:50 PM   Specimen: Anterior Nasal Swab  Result Value Ref Range Status   SARS Coronavirus 2 by RT PCR NEGATIVE NEGATIVE Final    Comment: (NOTE) SARS-CoV-2 target nucleic acids are NOT DETECTED.  The SARS-CoV-2 RNA is generally detectable in upper and lower respiratory specimens during  the acute phase of infection. The lowest concentration of SARS-CoV-2 viral copies this assay can detect is 250 copies / mL. A negative result does not preclude SARS-CoV-2 infection and should not be used as the sole basis for treatment or other patient management decisions.  A negative result may occur with improper specimen collection / handling, submission of specimen other than nasopharyngeal swab, presence of viral mutation(s) within the areas targeted by this assay, and inadequate number of viral copies (<250 copies / mL). A negative result must be combined with clinical observations, patient history, and epidemiological information.  Fact Sheet for Patients:   https://www.patel.info/  Fact Sheet for Healthcare Providers: https://hall.com/  This test is not yet approved or  cleared by the [place] FDA and has been authorized for detection  and/or diagnosis of SARS-CoV-2 by FDA under an Emergency Use Authorization (EUA).  This EUA will remain in effect (meaning this test can be used) for the duration of the COVID-19 declaration under Section 564(b)(1) of the Act, 21 U.S.C. section 360bbb-3(b)(1), unless the authorization is terminated or revoked sooner.  Performed at [facility], [place] [address] [place], [place] [address]   Anaerobic culture w Gram Stain     Status: None (Preliminary result)   Collection Time: 01/27/22  2:26 PM   Specimen: Synovial Fluid  Result Value Ref Range Status   Specimen Description   Final    FLUID Performed at [place] [address] [place], [place] [address]    Special Requests   Final    FLUID Performed at [facility], [place] [address] [place], [place] [address]    Gram Stain   Final    FEW WBC PRESENT,BOTH PMN AND MONONUCLEAR NO ORGANISMS SEEN Performed at [facility], [place] [address] [place], [place] [address]    Culture   Final    NO ANAEROBES ISOLATED; CULTURE IN PROGRESS FOR 5 DAYS   Report Status PENDING  Incomplete  Body fluid culture w Gram Stain     Status: None (Preliminary result)   Collection Time: 01/27/22  2:27 PM   Specimen: Synovial Fluid  Result Value Ref Range Status   Specimen Description   Final    FLUID Performed at [place] [address], [place], [place] [address]    Special Requests   Final    FLUID Performed at [facility], [place] [address] [place], [place] [address]    Gram Stain   Final    MODERATE WBC PRESENT, PREDOMINANTLY PMN NO ORGANISMS SEEN    Culture   Final    NO GROWTH < 24 HOURS Performed at [place] [address] [place], [place] [address]    Report Status PENDING  Incomplete     Labs: BNP (last 3 results) No results for input(s): "BNP" in the last 8760 hours. Basic Metabolic Panel: Recent Labs  Lab  01/24/22 1223 01/24/22 1238 01/25/22 0328  NA 134*  --  136  K 3.7  --  3.8  CL 106  --  108  CO2 22  --  23  GLUCOSE 132*  --  97  BUN 39*  --  25*  CREATININE 0.56  --  0.63  CALCIUM 7.6*  --  7.4*  7.6*  MG  --  1.8  --    Liver Function Tests: Recent Labs  Lab 01/25/22 0328  AST 14*  ALT 12  ALKPHOS 42  BILITOT 0.4  PROT 5.2*  ALBUMIN 2.3*  2.3*   No results for input(s): "LIPASE", "AMYLASE" in the last 168 hours. No results for input(s): "AMMONIA" in the last 168 hours. CBC: Recent Labs  Lab 01/25/22 1615 01/26/22 1025 01/27/22 0541 01/28/22 0616 01/29/22 0437  WBC 17.6* 19.4* 16.5* 15.4* 21.2*  NEUTROABS 11.2* 12.9*  --   --  15.4*  HGB 8.5* 9.8* 7.7* 7.7* 8.8*  HCT 27.0* 31.9* 25.5* 25.1* 28.2*  MCV 82.6 83.7 85.6 83.4 83.9  PLT 263 307 271 305 361   Cardiac Enzymes: Recent Labs  Lab 01/24/22 1238  CKTOTAL 267*   BNP: Invalid input(s): "POCBNP" CBG: Recent Labs  Lab 01/24/22 1214  GLUCAP 125*   D-Dimer No results for input(s): "DDIMER" in the last 72 hours. Hgb A1c No results for input(s): "HGBA1C" in the last 72 hours. Lipid Profile No results for input(s): "CHOL", "HDL", "LDLCALC", "TRIG", "CHOLHDL", "LDLDIRECT" in the last 72 hours. Thyroid function studies No results for input(s): "TSH", "T4TOTAL", "T3FREE", "THYROIDAB" in the last 72 hours.  Invalid input(s): "FREET3" Anemia work up No results for input(s): "VITAMINB12", "FOLATE", "FERRITIN", "TIBC", "IRON", "RETICCTPCT" in the last 72 hours. Urinalysis    Component Value Date/Time   COLORURINE STRAW (A) 01/24/2022 1221   APPEARANCEUR CLEAR 01/24/2022 1221   LABSPEC 1.014 01/24/2022 1221   PHURINE 5.0 01/24/2022 1221   GLUCOSEU NEGATIVE 01/24/2022 1221   GLUCOSEU NEGATIVE 04/08/2019 1645   HGBUR NEGATIVE 01/24/2022 1221   HGBUR 3+ 04/07/2010 0841   BILIRUBINUR NEGATIVE 01/24/2022 1221   BILIRUBINUR n 04/07/2014 1509   KETONESUR NEGATIVE 01/24/2022 1221   PROTEINUR  NEGATIVE 01/24/2022 1221   UROBILINOGEN 0.2 04/08/2019 1645   NITRITE NEGATIVE 01/24/2022 1221   LEUKOCYTESUR NEGATIVE 01/24/2022 1221   Sepsis Labs Recent Labs  Lab 01/26/22 1025 01/27/22 0541 01/28/22 0616 01/29/22 0437  WBC 19.4* 16.5* 15.4* 21.2*   Microbiology Recent Results (from the past 240 hour(s))  SARS Coronavirus 2 by RT PCR (hospital order, performed in [place] hospital lab) *cepheid single result test* Anterior Nasal Swab     Status: None   Collection Time: 01/24/22 12:50 PM   Specimen: Anterior Nasal Swab  Result Value Ref Range Status   SARS Coronavirus 2 by RT PCR NEGATIVE NEGATIVE Final    Comment: (NOTE) SARS-CoV-2 target nucleic acids are NOT DETECTED.  The SARS-CoV-2 RNA is generally detectable in upper and lower respiratory specimens during the acute phase of infection. The lowest concentration of SARS-CoV-2 viral copies this assay can detect is 250 copies / mL. A negative result does not preclude SARS-CoV-2 infection and should not be used as the sole basis for treatment or other patient management decisions.  A negative result may occur with improper specimen collection / handling, submission of specimen other than nasopharyngeal swab, presence of viral mutation(s) within the areas targeted by this assay, and inadequate number of viral copies (<250 copies / mL). A negative result must be combined with clinical observations, patient history, and epidemiological information.  Fact Sheet for Patients:   https://www.patel.info/  Fact Sheet for Healthcare Providers: https://hall.com/  This test is not yet approved or  cleared by the [place] FDA and has been authorized for detection and/or diagnosis of SARS-CoV-2 by FDA under an Emergency Use Authorization (EUA).  This EUA will remain in effect (meaning this test can be used) for the duration of the COVID-19 declaration under Section 564(b)(1) of the  Act, 21 U.S.C. section 360bbb-3(b)(1), unless the authorization is  terminated or revoked sooner.  Performed at [facility], [place] [address] [place], [place] [address]   Anaerobic culture w Gram Stain     Status: None (Preliminary result)   Collection Time: 01/27/22  2:26 PM   Specimen: Synovial Fluid  Result Value Ref Range Status   Specimen Description   Final    FLUID Performed at [place] [address], [place], [place] [address]    Special Requests   Final    FLUID Performed at [facility], [place] [address], [place], [place] [address]    Gram Stain   Final    FEW WBC PRESENT,BOTH PMN AND MONONUCLEAR NO ORGANISMS SEEN Performed at [facility], [place] [address] [place], [place] [address]    Culture   Final    NO ANAEROBES ISOLATED; CULTURE IN PROGRESS FOR 5 DAYS   Report Status PENDING  Incomplete  Body fluid culture w Gram Stain     Status: None (Preliminary result)   Collection Time: 01/27/22  2:27 PM   Specimen: Synovial Fluid  Result Value Ref Range Status   Specimen Description   Final    FLUID Performed at [place] [address], [place], [place] [address]    Special Requests   Final    FLUID Performed at [facility], [place] [address], [place], [place] [address]    Gram Stain   Final    MODERATE WBC PRESENT, PREDOMINANTLY PMN NO ORGANISMS SEEN    Culture   Final    NO GROWTH < 24 HOURS Performed at [place] [address] [place],  [address]    Report Status PENDING  Incomplete     Time coordinating discharge: 35 minutes  SIGNED:   Barb Merino, MD  [facility] 01/29/2022, 9:02 AM

## 2022-01-29 NOTE — Progress Notes (Signed)
Physical Therapy Treatment Patient Details Name: [name] MRN: [id number] DOB: 10-11-1980 Today's Date: 01/29/2022   History of Present Illness Patient is a 41 year old female who presented to the hosptial with with near syncopal episode. patient was noted to have Hgb of 6.9 and hemoccult positive. patient was seen by GI with EGD completed on 8/31. colonoscopy on 9/1 showed multiple anastomotic ulcers s/o Crohn's recurrence. patient was noted to have developed R knee pain with effusion during hospitalization.  Ortho was consulted on 9/2 with edema in R knee with aspiration preformed. fluid was no concerning for infection. PMH: crohn's disease, morbid obesity.    PT Comments    Pt in good spirits and progressing well with mobility.  Pt up to ambulate limited distance with RW and negotiated stairs with crutch and rail.  Pt eager for dc home this date  Recommendations for follow up therapy are one component of a multi-disciplinary discharge planning process, led by the attending physician.  Recommendations may be updated based on patient status, additional functional criteria and insurance authorization.  Follow Up Recommendations  No PT follow up     Assistance Recommended at Discharge Set up Supervision/Assistance  Patient can return home with the following Help with stairs or ramp for entrance;Assist for transportation;Assistance with cooking/housework   Equipment Recommendations  Rolling walker (2 wheels)    Recommendations for Other Services       Precautions / Restrictions Precautions Precautions: Fall Precaution Comments: ROM within pt tolerance Restrictions Weight Bearing Restrictions: No Other Position/Activity Restrictions: WBAT RLE per ortho on 9/2     Mobility  Bed Mobility               General bed mobility comments: patient was in recliner at start and returned to the same    Transfers Overall transfer level: Needs assistance Equipment used:  Rolling walker (2 wheels) Transfers: Sit to/from Stand Sit to Stand: Supervision           General transfer comment: cues for LE management and use of UEs to self assist    Ambulation/Gait Ambulation/Gait assistance: Supervision Gait Distance (Feet): 70 Feet Assistive device: Rolling walker (2 wheels) Gait Pattern/deviations: Step-to pattern, Shuffle, Trunk flexed Gait velocity: decr     General Gait Details: cues for sequence, posture and position from RW.   Stairs Stairs: Yes Stairs assistance: Min assist Stair Management: Two rails, One rail Left, Step to pattern, Forwards, With crutches Number of Stairs: 7 General stair comments: 2 steps with bil rails; 2+3 steps with rail and crutch.  Cues for sequence   Wheelchair Mobility    Modified Rankin (Stroke Patients Only)       Balance Overall balance assessment: Mild deficits observed, not formally tested                                          Cognition Arousal/Alertness: Awake/alert Behavior During Therapy: WFL for tasks assessed/performed Overall Cognitive Status: Within Functional Limits for tasks assessed                                          Exercises General Exercises - Lower Extremity Ankle Circles/Pumps: AROM, Both, 15 reps, Supine Quad Sets: AROM, 5 reps, Supine Long Arc Quad: AAROM, Left, 5 reps, Seated Heel  Slides: AAROM, Right, 5 reps, Supine    General Comments        Pertinent Vitals/Pain Pain Assessment Pain Assessment: 0-10 Faces Pain Scale: Hurts little more Pain Location: R knee Pain Descriptors / Indicators: Discomfort, Grimacing Pain Intervention(s): Limited activity within patient's tolerance    Home Living                          Prior Function            PT Goals (current goals can now be found in the care plan section) Acute Rehab PT Goals Patient Stated Goal: Regain IND and return home to 41 yr old PT Goal  Formulation: With patient Time For Goal Achievement: 02/11/22 Potential to Achieve Goals: Good Progress towards PT goals: Progressing toward goals    Frequency    Min 3X/week      PT Plan Current plan remains appropriate    Co-evaluation              AM-PAC PT "6 Clicks" Mobility   Outcome Measure  Help needed turning from your back to your side while in a flat bed without using bedrails?: None Help needed moving from lying on your back to sitting on the side of a flat bed without using bedrails?: None Help needed moving to and from a bed to a chair (including a wheelchair)?: None Help needed standing up from a chair using your arms (e.g., wheelchair or bedside chair)?: A Little Help needed to walk in hospital room?: A Little Help needed climbing 3-5 steps with a railing? : A Little 6 Click Score: 21    End of Session Equipment Utilized During Treatment: Gait belt Activity Tolerance: Patient tolerated treatment well;Patient limited by pain;Patient limited by fatigue Patient left: in chair;with call bell/phone within reach Nurse Communication: Mobility status PT Visit Diagnosis: Difficulty in walking, not elsewhere classified (R26.2);Pain Pain - Right/Left: Right Pain - part of body: Knee     Time: 0160-1093 PT Time Calculation (min) (ACUTE ONLY): 23 min  Charges:  $Gait Training: 8-22 mins                     [place] Pager [phone] Office [phone]    Trelon Plush 01/29/2022, 12:48 PM

## 2022-01-29 NOTE — Progress Notes (Signed)
Occupational Therapy Treatment Patient Details Name: [name] MRN: [id number] DOB: 07-Oct-1980 Today's Date: 01/29/2022   History of present illness Patient is a 41 year old female who presented to the hosptial with with near syncopal episode. patient was noted to have Hgb of 6.9 and hemoccult positive. patient was seen by GI with EGD completed on 8/31. colonoscopy on 9/1 showed multiple anastomotic ulcers s/o Crohn's recurrence. patient was noted to have developed R knee pain with effusion during hospitalization.  Ortho was consulted on 9/2 with edema in R knee with aspiration preformed. fluid was no concerning for infection. PMH: crohn's disease, morbid obesity.   OT comments  Patient was noted to have improvement in participation in ADLs. Patient was able to order total hip kit last night [place] with demonstration of AE on this date. Patient was Supervision with AE with increased time. Patient was MI with toileting tasks in bathroom on this date with RW.Patient's discharge plan remains appropriate at this time. OT will continue to follow acutely.     Recommendations for follow up therapy are one component of a multi-disciplinary discharge planning process, led by the attending physician.  Recommendations may be updated based on patient status, additional functional criteria and insurance authorization.    Follow Up Recommendations  No OT follow up    Assistance Recommended at Discharge Set up Supervision/Assistance  Patient can return home with the following  A little help with bathing/dressing/bathroom;Assistance with cooking/housework   Equipment Recommendations  Other (comment) (total hip kit)    Recommendations for Other Services      Precautions / Restrictions Precautions Precautions: Fall Restrictions Weight Bearing Restrictions: No Other Position/Activity Restrictions: WBAT RLE per ortho on 9/2       Mobility Bed Mobility Overal bed mobility: Modified  Independent                  Transfers                         Balance                                           ADL either performed or assessed with clinical judgement   ADL Overall ADL's : Needs assistance/impaired                     Lower Body Dressing: Modified independent;Sit to/from stand Lower Body Dressing Details (indicate cue type and reason): with education on using reacher and sock aid for LB dressing tasks. Toilet Transfer: Supervision/safety;Ambulation;Rolling walker (2 wheels) Toilet Transfer Details (indicate cue type and reason): with increased itme. Toileting- Clothing Manipulation and Hygiene: Modified independent;Sit to/from stand Toileting - Clothing Manipulation Details (indicate cue type and reason): with clothign management mesh underwear       General ADL Comments: Patient was able to complete functional mobility from hospital room to nursing stand with Supervision and RW on this date.    Extremity/Trunk Assessment Upper Extremity Assessment RUE Deficits / Details: noted to have increased edema in R forearm with patient reporting discomfort. patient noted to have superfical DVT in this area per charting. lovenox injection in PM hours on 9/3.            Vision       Perception     Praxis      Cognition Arousal/Alertness:  Awake/alert Behavior During Therapy: WFL for tasks assessed/performed Overall Cognitive Status: Within Functional Limits for tasks assessed                                          Exercises      Shoulder Instructions       General Comments      Pertinent Vitals/ Pain       Pain Assessment Pain Assessment: Faces Faces Pain Scale: Hurts little more Pain Location: R knee Pain Descriptors / Indicators: Discomfort, Grimacing Pain Intervention(s): Limited activity within patient's tolerance, Monitored during session  Home Living                                           Prior Functioning/Environment              Frequency  Min 2X/week        Progress Toward Goals  OT Goals(current goals can now be found in the care plan section)     Acute Rehab OT Goals Patient Stated Goal: to get home soon OT Goal Formulation: With patient Time For Goal Achievement: 02/11/22 Potential to Achieve Goals: [place] Discharge plan needs to be updated    Co-evaluation                 AM-PAC OT "6 Clicks" Daily Activity     Outcome Measure     Help from another person taking care of personal grooming?: None Help from another person toileting, which includes using toliet, bedpan, or urinal?: None Help from another person bathing (including washing, rinsing, drying)?: A Little Help from another person to put on and taking off regular upper body clothing?: None Help from another person to put on and taking off regular lower body clothing?: A Little 6 Click Score: 18    End of Session Equipment Utilized During Treatment: Gait belt;Rolling walker (2 wheels);Other (comment) (total hip kit.)  OT Visit Diagnosis: Unsteadiness on feet (R26.81);Pain   Activity Tolerance Patient tolerated treatment well   Patient Left in chair;with call bell/phone within reach   Nurse Communication Mobility status        Time: 5038-8828 OT Time Calculation (min): 40 min  Charges: OT General Charges $OT Visit: 1 Visit OT Treatments $Self Care/Home Management : 38-52 mins  Rennie Plowman, MS Acute Rehabilitation Department Office# [phone]   Marcellina Millin 01/29/2022, 10:28 AM

## 2022-01-30 ENCOUNTER — Telehealth: Payer: Self-pay

## 2022-01-30 LAB — SURGICAL PATHOLOGY

## 2022-01-30 NOTE — Anesthesia Postprocedure Evaluation (Signed)
Anesthesia Post Note  Patient: [name]  Procedure(s) Performed: COLONOSCOPY WITH PROPOFOL BIOPSY     Patient location during evaluation: Endoscopy Anesthesia Type: MAC Level of consciousness: awake and alert Pain management: pain level controlled Vital Signs Assessment: post-procedure vital signs reviewed and stable Respiratory status: spontaneous breathing, nonlabored ventilation, respiratory function stable and patient connected to nasal cannula oxygen Cardiovascular status: blood pressure returned to baseline and stable Postop Assessment: no apparent nausea or vomiting Anesthetic complications: no   No notable events documented.  Last Vitals:  Vitals:   01/29/22 0454 01/29/22 1327  BP: 136/60 (!) 121/90  Pulse: 96 93  Resp: 17 16  Temp: 36.7 C 36.7 C  SpO2: 96% 99%    Last Pain:  Vitals:   01/29/22 1327  TempSrc: Oral  PainSc:                  [name]

## 2022-01-30 NOTE — Telephone Encounter (Signed)
Transition Care Management Follow-up Telephone Call Date of discharge and from where: [facility] 01-29-22 Dx: symptomatic anemia How have you been since you were released from the hospital? Doing ok  Any questions or concerns? No  Items Reviewed: Did the pt receive and understand the discharge instructions provided? Yes  Medications obtained and verified? Yes  Other? No  Any new allergies since your discharge? No  Dietary orders reviewed? Yes Do you have support at home? Yes   Home Care and Equipment/Supplies: Were home health services ordered? no If so, what is the name of the agency? na  Has the agency set up a time to come to the patient's home? not applicable Were any new equipment or medical supplies ordered?  Yes: walker What is the name of the medical supply agency? Hospital  Were you able to get the supplies/equipment? yes Do you have any questions related to the use of the equipment or supplies? No  Functional Questionnaire: (I = Independent and D = Dependent) ADLs: I  Bathing/Dressing- I  Meal Prep- I  Eating- I  Maintaining continence- I  Transferring/Ambulation- I  Managing Meds- I  Follow up appointments reviewed:  PCP Hospital f/u appt confirmed? Yes  Scheduled to see Dr Regis Bill  on 02-01-22 @ [facility] f/u appt confirmed? Yes  Scheduled to see Dr Chester Holstein on 02-28-22 @ 830am. Are transportation arrangements needed? No  If their condition worsens, is the pt aware to call PCP or go to the Emergency Dept.? Yes Was the patient provided with contact information for the PCP's office or ED? Yes Was to pt encouraged to call back with questions or concerns? Yes

## 2022-01-31 LAB — BODY FLUID CULTURE W GRAM STAIN: Culture: NO GROWTH

## 2022-02-01 ENCOUNTER — Telehealth: Payer: Self-pay | Admitting: Nurse Practitioner

## 2022-02-01 ENCOUNTER — Telehealth (INDEPENDENT_AMBULATORY_CARE_PROVIDER_SITE_OTHER): Payer: BC Managed Care – PPO | Admitting: Internal Medicine

## 2022-02-01 ENCOUNTER — Emergency Department (HOSPITAL_COMMUNITY)
Admission: EM | Admit: 2022-02-01 | Discharge: 2022-02-02 | Disposition: A | Discharge status: Home / Self Care | Payer: BC Managed Care – PPO | Attending: Emergency Medicine | Admitting: Emergency Medicine

## 2022-02-01 ENCOUNTER — Telehealth: Payer: Self-pay | Admitting: Family Medicine

## 2022-02-01 ENCOUNTER — Telehealth: Payer: Self-pay | Admitting: Gastroenterology

## 2022-02-01 ENCOUNTER — Other Ambulatory Visit: Payer: Self-pay

## 2022-02-01 ENCOUNTER — Encounter: Payer: Self-pay | Admitting: Family Medicine

## 2022-02-01 ENCOUNTER — Encounter: Payer: Self-pay | Admitting: Internal Medicine

## 2022-02-01 ENCOUNTER — Encounter (HOSPITAL_COMMUNITY): Payer: Self-pay

## 2022-02-01 ENCOUNTER — Telehealth: Payer: Self-pay

## 2022-02-01 ENCOUNTER — Other Ambulatory Visit (INDEPENDENT_AMBULATORY_CARE_PROVIDER_SITE_OTHER): Payer: BC Managed Care – PPO

## 2022-02-01 VITALS — Ht 63.0 in | Wt 265.0 lb

## 2022-02-01 DIAGNOSIS — K50919 Crohn's disease, unspecified, with unspecified complications: Secondary | ICD-10-CM

## 2022-02-01 DIAGNOSIS — K922 Gastrointestinal hemorrhage, unspecified: Secondary | ICD-10-CM

## 2022-02-01 DIAGNOSIS — R55 Syncope and collapse: Secondary | ICD-10-CM

## 2022-02-01 DIAGNOSIS — N9489 Other specified conditions associated with female genital organs and menstrual cycle: Secondary | ICD-10-CM | POA: Diagnosis not present

## 2022-02-01 DIAGNOSIS — R799 Abnormal finding of blood chemistry, unspecified: Secondary | ICD-10-CM | POA: Diagnosis present

## 2022-02-01 DIAGNOSIS — D509 Iron deficiency anemia, unspecified: Secondary | ICD-10-CM | POA: Diagnosis not present

## 2022-02-01 DIAGNOSIS — K921 Melena: Secondary | ICD-10-CM

## 2022-02-01 DIAGNOSIS — N939 Abnormal uterine and vaginal bleeding, unspecified: Secondary | ICD-10-CM

## 2022-02-01 DIAGNOSIS — Z09 Encounter for follow-up examination after completed treatment for conditions other than malignant neoplasm: Secondary | ICD-10-CM | POA: Diagnosis not present

## 2022-02-01 DIAGNOSIS — D649 Anemia, unspecified: Secondary | ICD-10-CM

## 2022-02-01 DIAGNOSIS — D72829 Elevated white blood cell count, unspecified: Secondary | ICD-10-CM

## 2022-02-01 LAB — COMPREHENSIVE METABOLIC PANEL
ALT: 19 U/L (ref 0–44)
AST: 17 U/L (ref 15–41)
Albumin: 2.7 g/dL — ABNORMAL LOW (ref 3.5–5.0)
Alkaline Phosphatase: 46 U/L (ref 38–126)
Anion gap: 6 (ref 5–15)
BUN: 28 mg/dL — ABNORMAL HIGH (ref 6–20)
CO2: 25 mmol/L (ref 22–32)
Calcium: 8.4 mg/dL — ABNORMAL LOW (ref 8.9–10.3)
Chloride: 106 mmol/L (ref 98–111)
Creatinine, Ser: 0.58 mg/dL (ref 0.44–1.00)
GFR, Estimated: >60 mL/min (ref >60)
Glucose, Bld: 132 mg/dL — ABNORMAL HIGH (ref 70–99)
Potassium: 3.9 mmol/L (ref 3.5–5.1)
Sodium: 137 mmol/L (ref 135–145)
Total Bilirubin: 0.2 mg/dL — ABNORMAL LOW (ref 0.3–1.2)
Total Protein: 6.7 g/dL (ref 6.5–8.1)

## 2022-02-01 LAB — ANAEROBIC CULTURE W GRAM STAIN

## 2022-02-01 LAB — CBC
HCT: 20.9 % — ABNORMAL LOW (ref 36.0–46.0)
Hemoglobin: 6.4 g/dL — CL (ref 12.0–15.0)
MCH: 26.3 pg (ref 26.0–34.0)
MCHC: 30.6 g/dL (ref 30.0–36.0)
MCV: 86 fL (ref 80.0–100.0)
Platelets: 421 10*3/uL — ABNORMAL HIGH (ref 150–400)
RBC: 2.43 MIL/uL — ABNORMAL LOW (ref 3.87–5.11)
RDW: 19.1 % — ABNORMAL HIGH (ref 11.5–15.5)
WBC: 25.4 10*3/uL — ABNORMAL HIGH (ref 4.0–10.5)
nRBC: 1.4 % — ABNORMAL HIGH (ref 0.0–0.2)

## 2022-02-01 LAB — CBC WITH DIFFERENTIAL/PLATELET
Basophils Absolute: 0.1 10*3/uL (ref 0.0–0.1)
Basophils Relative: 0.3 % (ref 0.0–3.0)
Eosinophils Absolute: 0.2 10*3/uL (ref 0.0–0.7)
Eosinophils Relative: 0.9 % (ref 0.0–5.0)
HCT: 18.8 % — CL (ref 36.0–46.0)
Hemoglobin: 5.9 g/dL — CL (ref 12.0–15.0)
Lymphocytes Relative: 30.6 % (ref 12.0–46.0)
Lymphs Abs: 7.5 10*3/uL — ABNORMAL HIGH (ref 0.7–4.0)
MCHC: 31.2 g/dL (ref 30.0–36.0)
MCV: 80.2 fl (ref 78.0–100.0)
Monocytes Absolute: 2.1 10*3/uL — ABNORMAL HIGH (ref 0.1–1.0)
Monocytes Relative: 8.4 % (ref 3.0–12.0)
Neutro Abs: 14.6 10*3/uL — ABNORMAL HIGH (ref 1.4–7.7)
Neutrophils Relative %: 59.8 % (ref 43.0–77.0)
Platelets: 397 10*3/uL (ref 150.0–400.0)
RBC: 2.34 Mil/uL — ABNORMAL LOW (ref 3.87–5.11)
RDW: 20.5 % — ABNORMAL HIGH (ref 11.5–15.5)
WBC: 24.4 10*3/uL (ref 4.0–10.5)

## 2022-02-01 LAB — BASIC METABOLIC PANEL
BUN: 28 mg/dL — ABNORMAL HIGH (ref 6–23)
CO2: 29 mEq/L (ref 19–32)
Calcium: 8.1 mg/dL — ABNORMAL LOW (ref 8.4–10.5)
Chloride: 103 mEq/L (ref 96–112)
Creatinine, Ser: 0.6 mg/dL (ref 0.40–1.20)
GFR: 113.55 mL/min (ref >60.00)
Glucose, Bld: 112 mg/dL — ABNORMAL HIGH (ref 70–99)
Potassium: 3.4 mEq/L — ABNORMAL LOW (ref 3.5–5.1)
Sodium: 137 mEq/L (ref 135–145)

## 2022-02-01 LAB — PREPARE RBC (CROSSMATCH)

## 2022-02-01 LAB — I-STAT BETA HCG BLOOD, ED (MC, WL, AP ONLY): I-stat hCG, quantitative: <5 m[IU]/mL (ref <5)

## 2022-02-01 MED ORDER — SODIUM CHLORIDE 0.9 % IV SOLN: 10.0000 mL/h | Freq: Once | INTRAVENOUS | Status: DC

## 2022-02-01 NOTE — Progress Notes (Signed)
Virtual Visit via Video Note  I connected with [name] on 02/01/22 at  8:30 AM EDT by a video enabled telemedicine application and verified that I am speaking with the correct person using two identifiers. Location patient: home Location provider: home office Persons participating in the virtual visit: patient, provider  Patient aware  of the limitations of evaluation and management by telemedicine and  availability of in person appointments. and agreed to proceed.   HPI: [name] presents for video visit post hospital check.Aug 30 - Sept 4 2023. Presented to hosp 8 30 with syncope and  Gi bleed  anemia  down to  6.9 and given 3 prbcs  Had colon and egd noted  anastomosis ulcers felt rom nsaid s but treated as a Crohn's flare with 40 mg a day of prednisone for about 2 weeks.  She feels this flareup was aggravated by insurance changed to go off Remicade and changed her medicine for her Crohn's. To fu with gi team appt 10 4 , plan RTW Sept 18  After discharge from the hospital she felt stable fairly well until last night when she felt presyncopal again called the EMT who came and noted normal vital signs and she soon felt improved.  No active bleeding but did have some darker stools No stool today.  No abdominal pain fever still quite tired. She is just finishing up her regular.  Usually last 3 to 5 days now on the spot light phase. Was never directed to take iron is aware that it could cause a change in the way the stool looks.  Yesterday ate normally water unsweetened tea fluids adequate.  She is written to be out of work until September 18 asks about whether it should be extended or not.   Dced 9/4  Last hg 8.8 wbc 21.2K( on steroids) Had knee effusion?  Evaluated for infection had been taking naproxen for almost 2 weeks before the onset of the symptoms. Evlautaion for clot with elevated d dimer  RUE dvt  superficial  conservative rx .  Doing ok  ROS: See  pertinent positives and negatives per HPI. 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[place])    Vitamin D deficiency     Past Surgical History:  Procedure Laterality Date   APPENDECTOMY  7/06   BIOPSY  01/25/2022   Procedure: BIOPSY;  Surgeon: Jackquline Denmark, MD;  Location: [facility];  Service: Gastroenterology;;   BIOPSY  01/26/2022   Procedure: BIOPSY;  Surgeon: Jackquline Denmark, MD;  Location: [facility];  Service: Gastroenterology;;   CESAREAN SECTION N/A 11/30/2020   Procedure: CESAREAN SECTION;  Surgeon: Everett Graff, MD;  Location: [facility];  Service: Obstetrics;  Laterality: N/A;   COLONOSCOPY WITH PROPOFOL N/A 03/07/2017  Procedure: COLONOSCOPY WITH PROPOFOL;  Surgeon: Milus Banister, MD;  Location: [facility];  Service: Endoscopy;  Laterality: N/A;   COLONOSCOPY WITH PROPOFOL N/A 01/26/2022   Procedure: COLONOSCOPY WITH PROPOFOL;  Surgeon: Jackquline Denmark, MD;  Location: [facility];  Service: Gastroenterology;  Laterality: N/A;   ESOPHAGOGASTRODUODENOSCOPY (EGD) WITH PROPOFOL N/A 01/25/2022   Procedure: ESOPHAGOGASTRODUODENOSCOPY (EGD) WITH PROPOFOL;  Surgeon: Jackquline Denmark, MD;  Location: [facility];  Service: Gastroenterology;  Laterality: N/A;   EYE SURGERY  11-15-11   2003-multiple stys   ileocecal resection  1/11   crohns disease with fisula/stricture. Dr. Ronnald Collum   INCISIONAL HERNIA REPAIR N/A 12/24/2018    Procedure: OPEN REPAIR VENTRAL INCISIONAL HERNIA WITH MESH PATCH AND PRIMARY REPAIR OF LEFT SPIGELIAN HERNIA;  Surgeon: Armandina Gemma, MD;  Location: [facility];  Service: General;  Laterality: N/A;   KNEE ARTHROSCOPY  99, 02   Bil.   MYOMECTOMY N/A 11/10/2014   Procedure: MYOMECTOMY;  Surgeon: Everett Graff, MD;  Location: [facility];  Service: Gynecology;  Laterality: N/A;   PILONIDAL CYST EXCISION  2011, 2012   I&Ds   VENTRAL HERNIA REPAIR  11/27/2011   Procedure: LAPAROSCOPIC VENTRAL HERNIA;  Surgeon: Adin Hector, MD;  Location: [facility];  Service: General;  Laterality: N/A;  Laparoscopic Exploration & Repair of Hernia in Abdomen   WISDOM TOOTH EXTRACTION      Family History  Problem Relation Age of Onset   Aneurysm Mother        brain   Heart disease Mother 56       heart anyrism   Healthy Father        fairly   Diabetes Sister        half sister   Bipolar disorder Sister        half sister   Colon cancer Maternal Grandmother    Stomach cancer Paternal Grandfather    Multiple sclerosis Sister     Social History   Tobacco Use   Smoking status: Never   Smokeless tobacco: Never  Vaping Use   Vaping Use: Never used  Substance Use Topics   Alcohol use: No   Drug use: No      Current Outpatient Medications:    Calcium Carb-Cholecalciferol (CALCIUM/VITAMIN D) 600-400 MG-UNIT TABS, Take 1 tablet by mouth daily., Disp: , Rfl:    fexofenadine (ALLEGRA) 180 MG tablet, Take 180 mg by mouth daily., Disp: , Rfl:    metFORMIN (GLUCOPHAGE) 500 MG tablet, Take 1 tablet (500 mg total) by mouth 2 (two) times daily with a meal., Disp: 60 tablet, Rfl: 0   pantoprazole (PROTONIX) 40 MG tablet, Take 1 tablet (40 mg total) by mouth daily at 6 (six) AM., Disp: 30 tablet, Rfl: 0   [START ON 03/15/2022] predniSONE (DELTASONE) 10 MG tablet, Take 1 tablet (10 mg total) by mouth daily with breakfast for 14 days., Disp: 14 tablet, Rfl: 0   predniSONE (DELTASONE) 20 MG tablet, Take 2 tablets (40 mg  total) by mouth daily with breakfast for 13 days., Disp: 26 tablet, Rfl: 0   [START ON 02/28/2022] predniSONE (DELTASONE) 20 MG tablet, Take 1 tablet (20 mg total) by mouth daily with breakfast for 14 days., Disp: 14 tablet, Rfl: 0   Prenatal Vit-Fe Fumarate-FA (PRENATAL MULTIVITAMIN) TABS tablet, Take 1 tablet by mouth daily at 12 noon., Disp: , Rfl:    Vitamin D, Cholecalciferol, 50 MCG (2000 UT) CAPS, Take 2,000 Units by mouth daily., Disp: , Rfl:    fluticasone (FLONASE)  50 MCG/ACT nasal spray, Place 1 spray into both nostrils daily as needed for allergies. (Patient not taking: Reported on 02/01/2022), Disp: , Rfl:   EXAM: BP Readings from Last 3 Encounters:  01/29/22 (!) 121/90  12/26/21 100/67  12/05/21 120/78    VITALS per patient if applicable:  GENERAL: alert, oriented, appears well and in no acute distress non toxic  laying down    HEENT: atraumatic, conjunttiva clear, no obvious abnormalities on inspection of external nose and ears  NECK: normal movements of the head and neck  LUNGS: on inspection no signs of respiratory distress, breathing rate appears normal, no obvious gross SOB, gasping or wheezing  CV: no obvious cyanosis nl color  PSYCH/NEURO: pleasant and cooperative, no obvious depression or anxiety, speech and thought processing grossly intact Lab Results  Component Value Date   WBC 21.2 (H) 01/29/2022   HGB 8.8 (L) 01/29/2022   HCT 28.2 (L) 01/29/2022   PLT 361 01/29/2022   GLUCOSE 97 01/25/2022   CHOL 166 03/15/2021   TRIG 57 03/15/2021   HDL 63 03/15/2021   LDLCALC 92 03/15/2021   ALT 12 01/25/2022   AST 14 (L) 01/25/2022   NA 136 01/25/2022   K 3.8 01/25/2022   CL 108 01/25/2022   CREATININE 0.63 01/25/2022   BUN 25 (H) 01/25/2022   CO2 23 01/25/2022   TSH 0.89 08/26/2019   INR 1.47 05/26/2009   HGBA1C 5.8 (H) 12/26/2021    ASSESSMENT AND PLAN:  Discussed the following assessment and plan:    ICD-10-CM   1. Iron deficiency anemia, unspecified  iron deficiency anemia type  D50.9 CBC with Differential/Platelet    Basic metabolic panel    2. Hospital discharge follow-up  Z09     3. Crohn's disease with complication, unspecified gastrointestinal tract location ([place])  K50.919 CBC with Differential/Platelet    Basic metabolic panel    4. Near syncope  R55 CBC with Differential/Platelet    Basic metabolic panel    5. Gastrointestinal hemorrhage, unspecified gastrointestinal hemorrhage type  K92.2 CBC with Differential/Platelet    Basic metabolic panel    6. Leukocytosis, unspecified type  D72.829    presumed from steroids    Perhaps  sx last night related to  menses and blood loss  . Still tired  Plan updated lab today or tomorrow am .  Disc option of adding oral iron in interim but will message  GI team about this realizing still color will change to dark if added.  Plan re contact next week depending for further absence from work .   Counseled.   Expectant management and discussion of plan and treatment with opportunity to ask questions and all were answered. The patient agreed with the plan and demonstrated an understanding of the instructions.   Advised to call back or seek an in-person evaluation if worsening  or having  further concerns  in interim. Return for depending on results next week depending. [name], MD

## 2022-02-01 NOTE — Progress Notes (Signed)
I see that dr Volanda Napoleon reviewed labs and advised seek care in ed   and I agree   no wonder  you felt so bad last night  lower than when you go t admitted to hospital  forwarding to the GI team although some may be out of office or town should have a  back up person

## 2022-02-01 NOTE — Telephone Encounter (Signed)
Pt stated that she was admitted to the hospital last week where she had an Colonoscopy done on 01/26/2022 and an  EGD done on 01/30/2022: Pt stated that she was  discharged and has been taking predisone and was doing great until last night when pt started feeling very hot and like she was going to faint: Pt stated that she called EMS who assessed her and her vitals were fine, Pt had a virtual appt with her PCP this am in which she stated that they ordered labs for her to be done which she is on the way to our office to have labs completed : Pt was made aware that I will contact her tomorrow after labs have resulted;  Pt verbalized understanding with all questions answered.

## 2022-02-01 NOTE — Telephone Encounter (Signed)
Error

## 2022-02-01 NOTE — ED Triage Notes (Signed)
Patient has ulcers in her stomach and has Chrohns disease. Had a dark stool yesterday. Got light headed last night. Today she got blood work, got the call hemoglobin 5.9, was 8.8 when she left the hospital on Monday. Had 3 units of blood transfused on Sunday.

## 2022-02-01 NOTE — Telephone Encounter (Signed)
CRITICAL VALUE STICKER From [facility] CRITICAL VALUE: WBC: 24.4 Hemoglobin:5.9 Hematocrit:18.8  RECEIVER (on-site recipient of call):  DATE & TIME NOTIFIED: 02/01/22 5:05pm  MESSENGER (representative from lab):  MD NOTIFIED: Banks  TIME OF NOTIFICATION:5:07pm  RESPONSE:

## 2022-02-01 NOTE — Telephone Encounter (Signed)
This provider sent critical lab message as pt's PCP not in office.  H/H 5.9/18.8 today.  Per chart review patient recently discharged from hospital, seen for [place] virtually today.  Patient hospitalized 8/30-01/29/2022 for symptomatic anemia 2/2 GIB complicated by Crohn's disease on infliximab.  H/H at time of discharge 8.8/28.2.  During hospitalization patient received 3 units pRBCs.  Also noted increase in WBC.  Elevated during hospitalization secondary to steroid use.  Patient called via cell phone number listed however she did not answer.  VM left requesting patient contact office in regards to labs/proceed to nearest ED.  Grier Mitts, MD   2nd attempt to reach pt.  Called listed house phone number with no answer.  5:57pm Grier Mitts, MD   Patient sent a MyChart message. 6:03pm Grier Mitts, MD.

## 2022-02-01 NOTE — Discharge Instructions (Addendum)
You have known ulcers at your colon anastomosis point secondary to Crohn's flare after your insurance stopped your long-term Crohn's medications.  You had upper endoscopy and colonoscopy 5 and 6 days ago.  I spoke with GI regarding whether or not admitting you would result in any new medical work-up or surgical interventions.  We both feel at this time, since you are otherwise well-appearing and having a  known Crohn's flare. That you should continue your steroids as prescribed, receive 2 units blood transfusion in the emergency department today, and start IV iron transfusions. Dr. Luanna Salk states he will have the office call you first thing tomorrow morning to arrange these transfusions.  He also is going to move up your appointment scheduled for October 4th for closer follow-up.  You are highly recommended to return to emergency department for any worsening of bleeding or development of lightheadedness, dizziness, syncopal symptoms, or syncope for recheck of hemoglobin.

## 2022-02-01 NOTE — Telephone Encounter (Signed)
Patient called, states she had anemic episode yesterday and her iron level is still low. She is wondering if she can get some iron medication call in to her pharmacy. Requesting  a call back. Please call to advise.

## 2022-02-01 NOTE — ED Provider Notes (Signed)
[facility] DEPT Provider Note   CSN: [id number] Arrival date & time: 02/01/22  1918     History  Chief Complaint  Patient presents with   Abnormal Labs    [name] is a 41 y.o. female.  Pt is a 41 yo female with pmh of Crohns disease and gastric ulcers sent by pcp for low hemoglobin levels after melena. Chart review demonstrates patient was admitted 01/24/22-01/29/22 with GI bleeding and underwent upper GI endoscopy that was essentially negative and showed a small antral erosion, underwent colonoscopy showed multiple anastomotic ulcers with signs of Crohn's recurrence. Received total 3 units of PRBC with stabilization of hemoglobin. Patient states she had dark stool yesterday and had blood drawn today with hemoglobin of 5.9. Currently on day 6 of menstrual cycle. Currently only requiring panty liners.   Currently taking prednisone 40 mg x days   The history is provided by the patient. No language interpreter was used.       Home Medications Prior to Admission medications   Medication Sig Start Date End Date Taking? Authorizing Provider  Calcium Carb-Cholecalciferol (CALCIUM/VITAMIN D) 600-400 MG-UNIT TABS Take 1 tablet by mouth daily.   Yes [provider]  fexofenadine (ALLEGRA) 180 MG tablet Take 180 mg by mouth daily.   Yes [provider]  metFORMIN (GLUCOPHAGE) 500 MG tablet Take 1 tablet (500 mg total) by mouth 2 (two) times daily with a meal. 12/26/21  Yes Danford, Valetta Fuller D, NP  pantoprazole (PROTONIX) 40 MG tablet Take 1 tablet (40 mg total) by mouth daily at 6 (six) AM. 01/30/22 03/01/22 Yes Ghimire, Dante Gang, MD  predniSONE (DELTASONE) 20 MG tablet Take 2 tablets (40 mg total) by mouth daily with breakfast for 13 days. 01/30/22 02/12/22 Yes Barb Merino, MD  Prenatal Vit-Fe Fumarate-FA (PRENATAL MULTIVITAMIN) TABS tablet Take 1 tablet by mouth daily at 12 noon.   Yes [provider]  Vitamin D, Cholecalciferol, 50 MCG  (2000 UT) CAPS Take 2,000 Units by mouth daily.   Yes [provider]  predniSONE (DELTASONE) 10 MG tablet Take 1 tablet (10 mg total) by mouth daily with breakfast for 14 days. 03/15/22 03/29/22  Barb Merino, MD  predniSONE (DELTASONE) 20 MG tablet Take 1 tablet (20 mg total) by mouth daily with breakfast for 14 days. 02/28/22 03/14/22  Barb Merino, MD      Allergies    Patient has no known allergies.    Review of Systems   Review of Systems  Constitutional:  Negative for chills and fever.  HENT:  Negative for ear pain and sore throat.   Eyes:  Negative for pain and visual disturbance.  Respiratory:  Negative for cough and shortness of breath.   Cardiovascular:  Negative for chest pain and palpitations.  Gastrointestinal:  Positive for blood in stool. Negative for abdominal pain, diarrhea and vomiting.  Genitourinary:  Negative for dysuria and hematuria.  Musculoskeletal:  Negative for arthralgias and back pain.  Skin:  Negative for color change and rash.  Neurological:  Negative for seizures and syncope.  All other systems reviewed and are negative.   Physical Exam Updated Vital Signs BP (!) 150/77   Pulse (!) 103   Temp 98.9 F (37.2 C) (Oral)   Resp 19   LMP 01/20/2022 (Exact Date)   SpO2 100%  Physical Exam Vitals and nursing note reviewed.  Constitutional:      General: She is not in acute distress.    Appearance: She is well-developed.  HENT:     Head: Normocephalic and atraumatic.  Eyes:     Conjunctiva/sclera: Conjunctivae normal.  Cardiovascular:     Rate and Rhythm: Normal rate and regular rhythm.     Heart sounds: No murmur heard. Pulmonary:     Effort: Pulmonary effort is normal. No respiratory distress.     Breath sounds: Normal breath sounds.  Abdominal:     Palpations: Abdomen is soft.     Tenderness: There is no abdominal tenderness.  Musculoskeletal:        General: No swelling.     Cervical back: Neck supple.  Skin:    General:  Skin is warm and dry.     Capillary Refill: Capillary refill takes less than 2 seconds.  Neurological:     Mental Status: She is alert.  Psychiatric:        Mood and Affect: Mood normal.     ED Results / Procedures / Treatments   Labs (all labs ordered are listed, but only abnormal results are displayed) Labs Reviewed  COMPREHENSIVE METABOLIC PANEL - Abnormal; Notable for the following components:      Result Value   Glucose, Bld 132 (*)    BUN 28 (*)    Calcium 8.4 (*)    Albumin 2.7 (*)    Total Bilirubin 0.2 (*)    All other components within normal limits  CBC - Abnormal; Notable for the following components:   WBC 25.4 (*)    RBC 2.43 (*)    Hemoglobin 6.4 (*)    HCT 20.9 (*)    RDW 19.1 (*)    Platelets 421 (*)    nRBC 1.4 (*)    All other components within normal limits  I-STAT BETA HCG BLOOD, ED ([facility], [facility], [facility] ONLY)  TYPE AND SCREEN  PREPARE RBC (CROSSMATCH)    EKG None  Radiology No results found.  Procedures .Critical Care  Performed by: Lianne Cure, DO Authorized by: Lianne Cure, DO   Critical care provider statement:    Critical care time (minutes):  30   Critical care was necessary to treat or prevent imminent or life-threatening deterioration of the following conditions: severe anemia.   Critical care was time spent personally by me on the following activities:  Development of treatment plan with patient or surrogate, discussions with consultants, evaluation of patient's response to treatment, examination of patient, ordering and review of laboratory studies, ordering and review of radiographic studies, ordering and performing treatments and interventions, pulse oximetry, re-evaluation of patient's condition and review of old charts     Medications Ordered in ED Medications  0.9 %  sodium chloride infusion (has no administration in time range)    ED Course/ Medical Decision Making/ A&P                           Medical Decision  Making Amount and/or Complexity of Data Reviewed Labs: ordered.  Risk Prescription drug management.   41 yo female with pmh of Crohns disease and gastric ulcers sent by pcp for low hemoglobin levels after melena.  Patient is alert oriented x3, no acute distress, afebrile, stable vital signs.  Patient resting comfortably in bed.  Repeat hemoglobin on arrival 6.4.  2 units packed red blood cell ordered.  At this time I consulted [facility] since patient saw them on last admission.  At this time patient is very well-appearing with known ulcers at the anastomosis point  secondary to Crohn's flare after her insurance stopped her long-term Crohn's medications.  Patient had upper endoscopy and colonoscopy 5 and 6 days ago. Pt is also currently on menstrual cycle which is not helping the anemia.  I spoke with GI regarding whether or not admitting this patient would result in any new medical work-up or surgical interventions.  We both feel at this time since patient is otherwise well-appearing and has a known Crohn's flare. That she should continue her steroids as prescribed, receive 2 units blood transfusion in the emergency department today, and start IV iron transfusions. Dr. Luanna Salk states he will have the office call her first thing tomorrow morning to arrange these transfusions.  He also is going to move up her appointment scheduled for October 4th for closer follow-up.  Patient is highly recommended to return to emergency department for any worsening of bleeding or development of lightheadedness, dizziness, syncopal symptoms, or syncope for recheck of hemoglobin.  Patient agreeable to plan.  Patient's abdomen is otherwise soft and nontender.  Increased white blood cell count likely secondary to high-dose steroids.  Patient signed out to oncoming provider pending blood transfusion and discharge.        Final Clinical Impression(s) / ED Diagnoses Final diagnoses:  Symptomatic anemia  Melena   Vaginal bleeding  Crohn's disease with complication, unspecified gastrointestinal tract location [facility])    Rx / DC Orders ED Discharge Orders     None         Lianne Cure, DO 03/54/65 2239

## 2022-02-01 NOTE — Telephone Encounter (Signed)
Several attempts to contact patient via phone were made with no answer.  VM left.  Sent MyChart message.  Advised to proceed to nearest ED.

## 2022-02-02 ENCOUNTER — Encounter: Payer: Self-pay | Admitting: Internal Medicine

## 2022-02-02 ENCOUNTER — Other Ambulatory Visit: Payer: Self-pay

## 2022-02-02 ENCOUNTER — Telehealth: Payer: Self-pay

## 2022-02-02 DIAGNOSIS — D649 Anemia, unspecified: Secondary | ICD-10-CM

## 2022-02-02 DIAGNOSIS — K5090 Crohn's disease, unspecified, without complications: Secondary | ICD-10-CM

## 2022-02-02 LAB — TYPE AND SCREEN
ABO/RH(D): O POS
Antibody Screen: NEGATIVE
Unit division: 0
Unit division: 0

## 2022-02-02 LAB — BPAM RBC
Blood Product Expiration Date: 202310062359
Blood Product Expiration Date: 202310062359
ISSUE DATE / TIME: 202309072132
ISSUE DATE / TIME: 202309072318
Unit Type and Rh: 5100
Unit Type and Rh: 5100

## 2022-02-02 NOTE — Telephone Encounter (Signed)
Spoke with pt this AM. Pt stated that she is feeling better after the transfusions;  Pt questioning Bi[employer]ogic that she is currently on and stating that it is not working: Pt stated that she was on Remicade for 10 years and it worked great for her and she had to switch to [employer] which is not working well for her Crohns:  Pt is also questioning if she needs  IV iron:  Please advise on Pt Biologics and if you would like pt pt to have an referral to hematology for out pt IV iron:

## 2022-02-02 NOTE — Telephone Encounter (Signed)
Spoke to patient to follow up. Patient reports she went to ED last night. Said her vitals were good and was given 2 pints of blood.  Patient added she is waiting on a call from her GI to let know about getting Iron infusion.

## 2022-02-02 NOTE — Progress Notes (Signed)
Separate telephone note will be in the chart. GM

## 2022-02-02 NOTE — Telephone Encounter (Signed)
-----  Message from [place], DO sent at 02/01/2022  9:56 PM EDT ----- Patient went to the ER this evening due to anemia noted on labs by PCM (Hgb 5.9). Otherwise HD stable and no reason for admission per d/w ER attending. Will transfuse 2U in ER and d/c home. She is on Prednisone 40 mg for Crohns. Has f/u appt with PG 10/4, but maybe can find a sooner date w/ Nevin Bloodgood in RG's absence? Will also need to start coordinating transition of outpatient therapy. If thinking about going back on infliximab (or another biosimilar), will need infliximab Abs. Otherwise, could be a good candidate for Entyvio or a new agent such as Skyrizi? I will defer that to you as I have never met this patient.   Can we also coordinate for outpatient IV iron?

## 2022-02-02 NOTE — Telephone Encounter (Signed)
Chart notes indicate GI is aware. Pt as f/u 10/04, but they are hoping to get her in sooner.

## 2022-02-02 NOTE — Telephone Encounter (Signed)
The pt has been advised and will come next week for labs.  The orders have been entered.  Referral to hematology for IV iron has been made as urgent.  Will keep a look out for a sooner appt than 10/4.  No questions at this time.

## 2022-02-02 NOTE — Telephone Encounter (Signed)
This acts as a follow up to the previously forwarded laboratory results from the patient's PCP which were sent to Dr. Ardis Hughs and Dr. Lyndel Safe who are both away and I was covering their box overnight into this morning. I had sent a message to RN Gerarda Fraction to help with asking the patient to come to the emergency department for further evaluation. I later found out after review of the chart this morning that the patient had actually already gone to the emergency department and received blood transfusions. The plan of action has been discussed briefly with Dr. Bryan Lemma, on-call GI for [facility] on 9/7 PM into 9/8 early a.m. There has been confusion in regards to separate plans of action that were trying to be enabled.  I called and spoke with the patient this afternoon to clarify our plan of action as well as to apologize for the confusion with multiple providers trying to try to expedite her care.  The plan of action is as follows after further discussion with Dr. Bryan Lemma and myself this afternoon: 1) IV iron infusion order should be in place.  The patient receives her infusions through [facility] rheumatology.  If it is easier for her and faster for her to have the IV iron infusion and that can be sent over to [facility] rheumatology otherwise an expedited referral to hematology for IV iron infusion should be sent 2) patient should remain on prednisone 40 mg daily for now without plan for tapering as of yet until labs next week are obtained 3) patient could come in on Tuesday or Wednesday to have an ESR/CRP/CBC/calcium/vitamin D performed.  Although she is not due for her next Avsola dosing, it would be ideal to try to obtain Avsola antibody and drug levels (will not be a true trough) as well to see what has developed in regards to her antibody level and if she can pick up a fecal calprotectin stool kit that would be helpful for long-term follow-up as well 4) after discussion with Dr. Bryan Lemma it is not  clear that since she is already on 10 mg/kg of Avsola if an earlier infusion of Avsola will make a difference for her as the patient likely needs to make a transition in her biologic therapies thus the prednisone will act as her therapy for now until a decision is made in regards to potential biologic transitions 5) I would give the patient information about Entyvio as well as Skyrizi as well as Stelara so that she can begin researching potential options that may be offered to her in regards to switching Avsola if necessary and what seems to be evidence of treatment failure on appropriate dosing 6) if possible earlier follow-up should be arranged with NP Chester Holstein or myself or Dr. Bryan Lemma since we have reviewed the case briefly transition and medications will likely be needed  Please reach out to the patient and let her know this plan of action and that [place] and I have discussed this together.  No plan for adjustment of Avsola dosing schedule as we feel together it likely will not be beneficial   Justice Britain, MD [facility] Advanced Endoscopy Office # [phone]

## 2022-02-02 NOTE — Telephone Encounter (Signed)
Spoke with pt this AM:  Documented in different phone note:  Pt verbalized understanding with all questions answered.

## 2022-02-03 NOTE — Progress Notes (Signed)
A separate telephone note is in the chart from my discussion with the patient formally on 02/03/2019 3 PM.

## 2022-02-05 ENCOUNTER — Telehealth: Payer: Self-pay | Admitting: Hematology and Oncology

## 2022-02-05 ENCOUNTER — Encounter: Payer: Self-pay | Admitting: Gastroenterology

## 2022-02-05 NOTE — Telephone Encounter (Signed)
Scheduled appt per 9/8 referral. Pt is aware of appt date and time. Pt is aware to arrive 15 mins prior to appt time and to bring and updated insurance card. Pt is aware of appt location.

## 2022-02-05 NOTE — Telephone Encounter (Signed)
Sorry I just got this message   today  I advise  take  1 twice a day of the 65 elemental As tolerated .  Are you to get iron infusion ? Cannot tell from the EHR.   Hope you are feeling ok ( except very tired )

## 2022-02-06 ENCOUNTER — Telehealth: Payer: Self-pay

## 2022-02-06 NOTE — Telephone Encounter (Signed)
I spoke with Dr Melissa Noon office and was told that Avsola can only be scheduled every 8 weeks.  Insurance will not cover any sooner appts than that.  The pt is asking for a letter to be out of work from 8/30 until the end of the month.  See the note from pt below:  "Currently, I am taking 65 mg (325 mg Ferrous Sulfate) iron pills once a day at lunch time.  With that and eating as much iron enriched foods as possible, I do have more energy than before and have had no anemic episodes.  I have a doctor's appointment with the Hemoglobin department on next Thursday for them to determine the next course of action for the iron.  I do think that the prednisone is working as my bowel movements have returned to brown instead of black yesterday and today.  As far as the infusions, is there a reason that I cannot go back on Remicade?  It has worked since I had the surgery in 2011."

## 2022-02-06 NOTE — Telephone Encounter (Signed)
Good Morning    Thank you for the response.  Currently, I am taking 65 mg (325 mg Ferrous Sulfate) iron pills once a day at lunch time.  With that and eating as much iron enriched foods as possible, I do have more energy than before and have had no anemic episodes.  I have a doctor's appointment with the Hemoglobin department on next Thursday for them to determine the next course of action for the iron.  I do think that the prednisone is working as my bowel movements have returned to brown instead of black yesterday and today.  As far as the infusions, is there a reason that I cannot go back on Remicade?  It has worked since I had the surgery in 2011.   I have attached the letter from my insurance that I received in October 2022 about me having to try Avsola before approving to pay for Remicade.  Yes, can you please write me a letter to inform my job that I have been out of work since August 30th and will need to be out until the end of the month? Thank you.

## 2022-02-06 NOTE — Telephone Encounter (Signed)
All Conversations: Plan of action  [employer]) February 05, 2022 [name] to [facility] (supporting Milus Banister, MD)       02/05/22 10:11 AM Good Morning.  I received a phone call from the Hemoglobin department.  The doctor's appointment will not be until September 21st.  Since, I will not be receiving any iron infusions until after that, should I continue with 35 mg of iron daily?  Also, what is the plan for my infusions?  I have an appointment with Dr. Amil Amen on September 22nd.  Dr. Rush Landmark said that he would try to see if I could get my infusion sooner.  Also, is there anyway that he can recommend Remicade to my insurance, since it has worked for 10 years?  Also, what should I plan for work?  What would the estimate be on returning to work?  Thanks, [name]        02/05/22 10:59 AM You routed this conversation to Mansouraty, Telford Nab., MD  February 06, 2022 Mansouraty, Telford Nab., MD to [name]       02/06/22  5:01 AM Dear Ms. [name], Please continue taking the current iron that you are doing daily.  Do you know if the current iron that you are taking is elemental iron of 35 mg?  If it is then I agree with Dr. Regis Bill of increasing that to twice daily.  If it is not 35 mg of elemental iron, then please let us know, we will then need to send in a stronger prescription.  With your blood transfusions on board, it is okay that the iron infusions will be a little bit later this month but we will try to get those as soon as possible. I discussed your case with a few of my other GI colleagues as well; based on your overall trajectory and the way your colonoscopy looks endoscopically and on pathology, it is not clear to any of [name] that getting your Avsola sooner is going to make a difference in regards to your current clinical status.  We can certainly ask the insurance whether they would give [name] approval but most of [name] believe that the next step  for you is going to be a transition of your biologic therapy.  That is why we want you to begin to research/evaluate medication such as Rinvoq/Entyvio/Stelara.   I will have our team try to get an earlier infusion approved, but for now the plan of action will be to continue taking oral prednisone, get your laboratories, get you back in clinic, and likely consider transitioning your medication from there.   In regards to your plan for work, that is not completely clear.  It is likely reasonable to be out for at least the next 2 weeks so that we can see how your repeat blood counts look this week and how they look when you see hematology and Dr. Amil Amen.  We would be happy to get this letter to you if you need it or if your primary care provider can provide it as well that is okay too. Good luck and good health.   Donna Britain, MD  This MyChart message has not been read. Mansouraty, Telford Nab., MD to Me       02/06/22  5:03 AM Replied to patient on MyChart.  We will see what she states.  If she needs a work note for [name] then I think 2 weeks makes sense for now.  Go ahead and see if we can get an earlier infusion arranged through insurance for Avsola but likely patient will need a complete transition of her medications.  Please also check if Rinvoq/Entyvio/Stelara may have issues getting approved with her current insurance, because those are one of the 3 that we are most likely to transition to for her.  Thanks.  GM

## 2022-02-06 NOTE — Telephone Encounter (Signed)
[name], I have replied to the patient on MyChart. I would like you to cancel the therapeutic drug monitoring Avsola level/antibody level for this week. I want that to be drawn within 1 week of her next infusion to get the best drug level and antibody level. The rest of the labs that we have already ordered will be kept. A work note through the end of this month makes sense due to her ongoing active disease, you can work on that and I am happy to sign it. The patient should be maintained on prednisone 40 mg daily until we get her to her next infusion, so if she needs additional prednisone tablets please make sure she has that available. Thank you for all of your help. GM

## 2022-02-06 NOTE — Telephone Encounter (Signed)
-----   Message from Willia Craze, NP sent at 02/06/2022  1:33 PM EDT ----- Mickel Baas, please call patient and tell her that it is okay to start the oral iron.  Let her know that it would probably turn her stools black and that can make it hard to differentiate from GI bleeding.  If her stools get loose, tar-like or more frequent then that can indicate active GI bleeding and she should let us know.  Thanks ----- Message ----- From: Burnis Medin, MD Sent: 02/01/2022   9:44 AM EDT To: Willia Craze, NP; Jackquline Denmark, MD  Patient is not on iron ;planning  anemia check this week because  she called EMT last night for feelings sx coming back;  ;;please advise her if ok for her to begin iron supplementation( although she knows that stool  color will change ) dr Ardis Hughs and Dr Lyndel Safe NA but you will be seeing her in October . Thanks [facility]

## 2022-02-06 NOTE — Telephone Encounter (Signed)
Spoke with pt and gave her recommendations. Pt verbalized understanding.

## 2022-02-07 ENCOUNTER — Telehealth: Payer: Self-pay | Admitting: Gastroenterology

## 2022-02-07 ENCOUNTER — Other Ambulatory Visit (INDEPENDENT_AMBULATORY_CARE_PROVIDER_SITE_OTHER): Payer: BC Managed Care – PPO

## 2022-02-07 DIAGNOSIS — K5090 Crohn's disease, unspecified, without complications: Secondary | ICD-10-CM

## 2022-02-07 DIAGNOSIS — D649 Anemia, unspecified: Secondary | ICD-10-CM

## 2022-02-07 LAB — CBC WITH DIFFERENTIAL/PLATELET
Basophils Absolute: 0.1 10*3/uL (ref 0.0–0.1)
Basophils Relative: 0.2 % (ref 0.0–3.0)
Eosinophils Absolute: 0.3 10*3/uL (ref 0.0–0.7)
Eosinophils Relative: 0.9 % (ref 0.0–5.0)
HCT: 22.6 % — CL (ref 36.0–46.0)
Hemoglobin: 7 g/dL — CL (ref 12.0–15.0)
Lymphocytes Relative: 22.8 % (ref 12.0–46.0)
Lymphs Abs: 6.9 10*3/uL — ABNORMAL HIGH (ref 0.7–4.0)
MCHC: 30.9 g/dL (ref 30.0–36.0)
MCV: 83.6 fl (ref 78.0–100.0)
Monocytes Absolute: 2 10*3/uL — ABNORMAL HIGH (ref 0.1–1.0)
Monocytes Relative: 6.7 % (ref 3.0–12.0)
Neutro Abs: 20.9 10*3/uL — ABNORMAL HIGH (ref 1.4–7.7)
Neutrophils Relative %: 69.4 % (ref 43.0–77.0)
Platelets: 537 10*3/uL — ABNORMAL HIGH (ref 150.0–400.0)
RBC: 2.71 Mil/uL — ABNORMAL LOW (ref 3.87–5.11)
RDW: 19.4 % — ABNORMAL HIGH (ref 11.5–15.5)
WBC: 30.1 10*3/uL (ref 4.0–10.5)

## 2022-02-07 LAB — VITAMIN D 25 HYDROXY (VIT D DEFICIENCY, FRACTURES): VITD: 37.53 ng/mL (ref 30.00–100.00)

## 2022-02-07 LAB — SEDIMENTATION RATE: Sed Rate: 33 mm/hr — ABNORMAL HIGH (ref 0–20)

## 2022-02-07 LAB — C-REACTIVE PROTEIN: CRP: 1.2 mg/dL (ref 0.5–20.0)

## 2022-02-07 LAB — CALCIUM: Calcium: 8.8 mg/dL (ref 8.4–10.5)

## 2022-02-07 NOTE — Telephone Encounter (Signed)
I have cancelled the avsola antibody   Work note has been written and sent to the pt

## 2022-02-07 NOTE — Telephone Encounter (Signed)
Patient called states she went to the lab and  there are two lab work order in for her. States she would like to know if she need to do the stool sample as well. Requesting a call back. Please call to advise.

## 2022-02-07 NOTE — Telephone Encounter (Signed)
The pt has been advised that she will need to complete the fecal calprotectin as ordered per Dr Bryan Lemma.  She has the kit and will proceed.

## 2022-02-08 ENCOUNTER — Other Ambulatory Visit: Payer: Self-pay

## 2022-02-08 DIAGNOSIS — D649 Anemia, unspecified: Secondary | ICD-10-CM

## 2022-02-09 ENCOUNTER — Other Ambulatory Visit: Payer: Self-pay

## 2022-02-09 ENCOUNTER — Other Ambulatory Visit (INDEPENDENT_AMBULATORY_CARE_PROVIDER_SITE_OTHER): Payer: BC Managed Care – PPO

## 2022-02-09 ENCOUNTER — Emergency Department (HOSPITAL_COMMUNITY)
Admission: EM | Admit: 2022-02-09 | Discharge: 2022-02-09 | Disposition: A | Discharge status: Home / Self Care | Payer: BC Managed Care – PPO | Attending: Emergency Medicine | Admitting: Emergency Medicine

## 2022-02-09 ENCOUNTER — Encounter (HOSPITAL_COMMUNITY): Payer: Self-pay

## 2022-02-09 DIAGNOSIS — K5090 Crohn's disease, unspecified, without complications: Secondary | ICD-10-CM

## 2022-02-09 DIAGNOSIS — D649 Anemia, unspecified: Secondary | ICD-10-CM

## 2022-02-09 DIAGNOSIS — Z79899 Other long term (current) drug therapy: Secondary | ICD-10-CM | POA: Diagnosis not present

## 2022-02-09 DIAGNOSIS — R799 Abnormal finding of blood chemistry, unspecified: Secondary | ICD-10-CM | POA: Diagnosis present

## 2022-02-09 LAB — COMPREHENSIVE METABOLIC PANEL
ALT: 16 U/L (ref 0–44)
AST: 14 U/L — ABNORMAL LOW (ref 15–41)
Albumin: 3.3 g/dL — ABNORMAL LOW (ref 3.5–5.0)
Alkaline Phosphatase: 56 U/L (ref 38–126)
Anion gap: 7 (ref 5–15)
BUN: 15 mg/dL (ref 6–20)
CO2: 27 mmol/L (ref 22–32)
Calcium: 8.9 mg/dL (ref 8.9–10.3)
Chloride: 104 mmol/L (ref 98–111)
Creatinine, Ser: 0.71 mg/dL (ref 0.44–1.00)
GFR, Estimated: >60 mL/min (ref >60)
Glucose, Bld: 135 mg/dL — ABNORMAL HIGH (ref 70–99)
Potassium: 4.6 mmol/L (ref 3.5–5.1)
Sodium: 138 mmol/L (ref 135–145)
Total Bilirubin: 0.3 mg/dL (ref 0.3–1.2)
Total Protein: 6.8 g/dL (ref 6.5–8.1)

## 2022-02-09 LAB — CBC WITH DIFFERENTIAL/PLATELET
Abs Immature Granulocytes: 1.36 10*3/uL — ABNORMAL HIGH (ref 0.00–0.07)
Basophils Absolute: 0.1 10*3/uL (ref 0.0–0.1)
Basophils Absolute: 0.1 10*3/uL (ref 0.0–0.1)
Basophils Relative: 0.5 % (ref 0.0–3.0)
Basophils Relative: 0 %
Eosinophils Absolute: 0.2 10*3/uL (ref 0.0–0.7)
Eosinophils Absolute: 0 10*3/uL (ref 0.0–0.5)
Eosinophils Relative: 0.6 % (ref 0.0–5.0)
Eosinophils Relative: 0 %
HCT: 22.3 % — CL (ref 36.0–46.0)
HCT: 24.9 % — ABNORMAL LOW (ref 36.0–46.0)
Hemoglobin: 6.9 g/dL — CL (ref 12.0–15.0)
Hemoglobin: 7.4 g/dL — ABNORMAL LOW (ref 12.0–15.0)
Immature Granulocytes: 5 %
Lymphocytes Relative: 16.7 % (ref 12.0–46.0)
Lymphocytes Relative: 12 %
Lymphs Abs: 4.6 10*3/uL — ABNORMAL HIGH (ref 0.7–4.0)
Lymphs Abs: 3.5 10*3/uL (ref 0.7–4.0)
MCH: 26.5 pg (ref 26.0–34.0)
MCHC: 30.8 g/dL (ref 30.0–36.0)
MCHC: 29.7 g/dL — ABNORMAL LOW (ref 30.0–36.0)
MCV: 83.5 fl (ref 78.0–100.0)
MCV: 89.2 fL (ref 80.0–100.0)
Monocytes Absolute: 1.4 10*3/uL — ABNORMAL HIGH (ref 0.1–1.0)
Monocytes Absolute: 1.4 10*3/uL — ABNORMAL HIGH (ref 0.1–1.0)
Monocytes Relative: 5.1 % (ref 3.0–12.0)
Monocytes Relative: 5 %
Neutro Abs: 21.4 10*3/uL — ABNORMAL HIGH (ref 1.4–7.7)
Neutro Abs: 22.6 10*3/uL — ABNORMAL HIGH (ref 1.7–7.7)
Neutrophils Relative %: 77.1 % — ABNORMAL HIGH (ref 43.0–77.0)
Neutrophils Relative %: 78 %
Platelets: 516 10*3/uL — ABNORMAL HIGH (ref 150.0–400.0)
Platelets: 555 10*3/uL — ABNORMAL HIGH (ref 150–400)
RBC: 2.67 Mil/uL — ABNORMAL LOW (ref 3.87–5.11)
RBC: 2.79 MIL/uL — ABNORMAL LOW (ref 3.87–5.11)
RDW: 20 % — ABNORMAL HIGH (ref 11.5–15.5)
RDW: 19.4 % — ABNORMAL HIGH (ref 11.5–15.5)
WBC: 27.8 10*3/uL (ref 4.0–10.5)
WBC: 29 10*3/uL — ABNORMAL HIGH (ref 4.0–10.5)
nRBC: 1.2 % — ABNORMAL HIGH (ref 0.0–0.2)

## 2022-02-09 MED ORDER — SODIUM CHLORIDE 0.9 % IV SOLN: 10.0000 mL/h | Freq: Once | INTRAVENOUS | Status: DC

## 2022-02-09 NOTE — ED Triage Notes (Signed)
Patient states she was called by [facility] and told to come to the ED for Hgb-6.9.

## 2022-02-09 NOTE — Discharge Instructions (Signed)
You are given 1 unit of blood in the emergency department for anemia, which may be related to chronic GI bleeding.  Please follow-up next week with the GI clinic as already scheduled.  They should recheck your hemoglobin count early next week if possible.  You may benefit from iron transfusions.

## 2022-02-09 NOTE — ED Provider Notes (Signed)
[facility] DEPT Provider Note   CSN: [id number] Arrival date & time: 02/09/22  1354     History  Chief Complaint  Patient presents with   Abnormal Lab    [name] is a 41 y.o. female presenting to Emergency Department with concern for low hemoglobin level.  The patient was contacted by her GI office told she needs to come to the ED because her hemoglobin is 6.9.  She reports her baseline level is usually around 7.0.  She denies shortness of breath, lightheadedness, dyspnea on exertion.  She has a chronic issue with anemia likely related to her Crohn's disease.  She is scheduled for an iron transfusion next week.  She follows with Dr Ardis Hughs at [facility].  The patient reports her last blood transfusion was approximate week ago when she was seen in the ED on 02/01/22.  Per review of the records the patient's hemoglobin is 6.4 at that time.  She received 2 units of blood as a transfusion.  She denies continued dark or tarry stools.  She is not on blood thinners.  She does maintain on prednisone 40 mg daily for Crohn's disease  HPI     Home Medications Prior to Admission medications   Medication Sig Start Date End Date Taking? Authorizing Provider  Calcium Carb-Cholecalciferol (CALCIUM/VITAMIN D) 600-400 MG-UNIT TABS Take 1 tablet by mouth at bedtime.   Yes [provider]  Cholecalciferol (VITAMIN D3) 25 MCG (1000 UT) CAPS Take 1,000 Units by mouth daily with supper.   Yes [provider]  fexofenadine (ALLEGRA) 180 MG tablet Take 180 mg by mouth at bedtime.   Yes [provider]  Iron, Ferrous Sulfate, 325 (65 Fe) MG TABS Take 325 mg by mouth 2 (two) times daily with a meal.   Yes [provider]  metFORMIN (GLUCOPHAGE) 500 MG tablet Take 1 tablet (500 mg total) by mouth 2 (two) times daily with a meal. 12/26/21  Yes Danford, Valetta Fuller D, NP  pantoprazole (PROTONIX) 40 MG tablet Take 1 tablet (40 mg total) by mouth  daily at 6 (six) AM. 01/30/22 03/01/22 Yes Ghimire, Dante Gang, MD  predniSONE (DELTASONE) 20 MG tablet Take 2 tablets (40 mg total) by mouth daily with breakfast for 13 days. 01/30/22 02/12/22 Yes Barb Merino, MD  Prenatal Vit-Fe Fumarate-FA (PRENATAL MULTIVITAMIN) TABS tablet Take 1 tablet by mouth at bedtime.   Yes [provider]  predniSONE (DELTASONE) 10 MG tablet Take 1 tablet (10 mg total) by mouth daily with breakfast for 14 days. Patient not taking: Reported on 02/09/2022 03/15/22 03/29/22  Barb Merino, MD  predniSONE (DELTASONE) 20 MG tablet Take 1 tablet (20 mg total) by mouth daily with breakfast for 14 days. Patient not taking: Reported on 02/09/2022 02/28/22 03/14/22  Barb Merino, MD      Allergies    Patient has no known allergies.    Review of Systems   Review of Systems  Physical Exam Updated Vital Signs BP (!) 107/52   Pulse 78   Temp 97.8 F (36.6 C) (Oral)   Resp 14   Ht 5' 3"  (1.6 m)   Wt 117.9 kg   LMP 01/20/2022 (Exact Date)   SpO2 100%   BMI 46.06 kg/m  Physical Exam Constitutional:      General: She is not in acute distress. HENT:     Head: Normocephalic and atraumatic.  Eyes:     Conjunctiva/sclera: Conjunctivae normal.     Pupils: Pupils are equal, round,  and reactive to light.  Cardiovascular:     Rate and Rhythm: Normal rate and regular rhythm.  Pulmonary:     Effort: Pulmonary effort is normal. No respiratory distress.  Skin:    General: Skin is warm and dry.  Neurological:     General: No focal deficit present.     Mental Status: She is alert and oriented to person, place, and time. Mental status is at baseline.  Psychiatric:        Mood and Affect: Mood normal.        Behavior: Behavior normal.     ED Results / Procedures / Treatments   Labs (all labs ordered are listed, but only abnormal results are displayed) Labs Reviewed  COMPREHENSIVE METABOLIC PANEL - Abnormal; Notable for the following components:      Result Value    Glucose, Bld 135 (*)    Albumin 3.3 (*)    AST 14 (*)    All other components within normal limits  CBC WITH DIFFERENTIAL/PLATELET - Abnormal; Notable for the following components:   WBC 29.0 (*)    RBC 2.79 (*)    Hemoglobin 7.4 (*)    HCT 24.9 (*)    MCHC 29.7 (*)    RDW 19.4 (*)    Platelets 555 (*)    nRBC 1.2 (*)    Neutro Abs 22.6 (*)    Monocytes Absolute 1.4 (*)    Abs Immature Granulocytes 1.36 (*)    All other components within normal limits  TYPE AND SCREEN  PREPARE RBC (CROSSMATCH)    EKG EKG Interpretation  Date/Time:  Friday February 09 2022 20:09:40 EDT Ventricular Rate:  73 PR Interval:  191 QRS Duration: 89 QT Interval:  379 QTC Calculation: 418 R Axis:   76 Text Interpretation: Sinus rhythm Confirmed by Octaviano Glow [phone] on 02/09/2022 9:33:19 PM  Radiology No results found.  Procedures .Critical Care  Performed by: Wyvonnia Dusky, MD Authorized by: Wyvonnia Dusky, MD   Critical care provider statement:    Critical care time (minutes):  30   Critical care was necessary to treat or prevent imminent or life-threatening deterioration of the following conditions:  Circulatory failure   Critical care was time spent personally by me on the following activities:  Development of treatment plan with patient or surrogate, discussions with consultants, evaluation of patient's response to treatment, examination of patient, ordering and review of laboratory studies, ordering and review of radiographic studies, ordering and performing treatments and interventions, pulse oximetry, re-evaluation of patient's condition and review of old charts   Care discussed with: admitting provider   Comments:     Blood transfusion for anemia     Medications Ordered in ED Medications  0.9 %  sodium chloride infusion (has no administration in time range)    ED Course/ Medical Decision Making/ A&P                           Medical Decision Making Amount and/or  Complexity of Data Reviewed Labs: ordered.  Risk Prescription drug management.   This patient presents to the ED with concern for anemia. This involves an extensive number of treatment options, and is a complaint that carries with it a high risk of complications and morbidity.   Co-morbidities that complicate the patient evaluation: hx of crohn's and GI bleed  External records from outside source obtained and reviewed including GI office evaluations, hgb trends  I ordered  and personally interpreted labs.  The pertinent results include:  hgb 7.4, labs otherwise at baseline (WBC chronically elevated in setting of prednisone 40 mg daily usage)  The patient was maintained on a cardiac monitor.  I personally viewed and interpreted the cardiac monitored which showed an underlying rhythm of: NSR  Per my interpretation the patient's ECG shows NSR   After the interventions noted above, I reevaluated the patient and found that they have: stayed the same  We discussed with the patient about giving 1 unit blood transfusion in the ED versus close follow-up with GI, which is scheduled next week.  However given that she is coming into the weekend, and she is reasonably frustrated but being sent repetitively to the ED for these borderline anemic blood pressures, we did discuss transfusion of a single unit of blood, and the patient is willing and agreeable to have this done.  Given that she likely has an ongoing chronic slow GI, I think a unit of blood at this time would be a reasonable measure to stabilize her through the weekend until she can initiate her blood transfusions.  I consented the patient discussed the risks and benefits of transfusion, including infection and adverse or allergic reaction to the blood.  Dispostion:  After consideration of the diagnostic results and the patients response to treatment, I feel that the patent would benefit from GI outpatient f/u.         Final Clinical  Impression(s) / ED Diagnoses Final diagnoses:  Anemia, unspecified type    Rx / DC Orders ED Discharge Orders     None         Wyvonnia Dusky, MD 02/09/22 2308

## 2022-02-09 NOTE — ED Provider Triage Note (Signed)
Emergency Medicine Provider Triage Evaluation Note  Hala Narula , a 41 y.o. female  was evaluated in triage.  Pt complains of anemia.  Patient has known history of iron deficiency anemia.  Acute anemia comes from gastric ulcers.  Patient follows with gastroenterology service.  She has been transfused twice in the past.  Patient had an appointment with GI today, they ordered CBC.  Hemoglobin 6.9.  Has hematology appointment next Thursday.  Patient currently asymptomatic.   Review of Systems  Positive: As above Negative: Dizziness, lightheadedness, nausea, vomiting, diarrhea melena, hematochezia  Physical Exam  BP 121/85 (BP Location: Left Arm)   Pulse 87   Temp 98.1 F (36.7 C) (Oral)   Resp 18   Ht 5' 3"  (1.6 m)   Wt 117.9 kg   LMP 01/20/2022 (Exact Date)   SpO2 99%   BMI 46.06 kg/m  Gen:   Awake, no distress   Resp:  Normal effort  MSK:   Moves extremities without difficulty  Other:  Pallor on mucous membranes  Medical Decision Making  Medically screening exam initiated at 2:34 PM.  Appropriate orders placed.  Tamella Tuccillo was informed that the remainder of the evaluation will be completed by another provider, this initial triage assessment does not replace that evaluation, and the importance of remaining in the ED until their evaluation is complete.  We will order basic labs, type and screen, EKG   Roylene Reason, Hershal Coria 02/09/22 1444

## 2022-02-10 LAB — TYPE AND SCREEN
ABO/RH(D): O POS
Antibody Screen: NEGATIVE
Unit division: 0

## 2022-02-10 LAB — BPAM RBC
Blood Product Expiration Date: 202310212359
ISSUE DATE / TIME: 202309152117
Unit Type and Rh: 5100

## 2022-02-10 LAB — PREPARE RBC (CROSSMATCH)

## 2022-02-12 ENCOUNTER — Other Ambulatory Visit (INDEPENDENT_AMBULATORY_CARE_PROVIDER_SITE_OTHER): Payer: Self-pay | Admitting: Adult Health

## 2022-02-12 DIAGNOSIS — R7303 Prediabetes: Secondary | ICD-10-CM

## 2022-02-13 DIAGNOSIS — Z0279 Encounter for issue of other medical certificate: Secondary | ICD-10-CM

## 2022-02-14 ENCOUNTER — Other Ambulatory Visit: Payer: BC Managed Care – PPO

## 2022-02-14 DIAGNOSIS — D649 Anemia, unspecified: Secondary | ICD-10-CM

## 2022-02-14 DIAGNOSIS — K5090 Crohn's disease, unspecified, without complications: Secondary | ICD-10-CM

## 2022-02-15 ENCOUNTER — Other Ambulatory Visit: Payer: Self-pay

## 2022-02-15 ENCOUNTER — Inpatient Hospital Stay: Payer: BC Managed Care – PPO | Attending: Hematology and Oncology | Admitting: Hematology and Oncology

## 2022-02-15 ENCOUNTER — Inpatient Hospital Stay: Payer: BC Managed Care – PPO

## 2022-02-15 ENCOUNTER — Encounter: Payer: Self-pay | Admitting: Hematology and Oncology

## 2022-02-15 VITALS — BP 113/62 | HR 90 | Temp 97.7°F | Resp 16 | Ht 63.0 in | Wt 258.0 lb

## 2022-02-15 DIAGNOSIS — Z800 Family history of malignant neoplasm of digestive organs: Secondary | ICD-10-CM | POA: Insufficient documentation

## 2022-02-15 DIAGNOSIS — D509 Iron deficiency anemia, unspecified: Secondary | ICD-10-CM | POA: Insufficient documentation

## 2022-02-15 DIAGNOSIS — Z79899 Other long term (current) drug therapy: Secondary | ICD-10-CM | POA: Insufficient documentation

## 2022-02-15 DIAGNOSIS — D649 Anemia, unspecified: Secondary | ICD-10-CM | POA: Diagnosis present

## 2022-02-15 DIAGNOSIS — K5090 Crohn's disease, unspecified, without complications: Secondary | ICD-10-CM | POA: Insufficient documentation

## 2022-02-15 DIAGNOSIS — D75839 Thrombocytosis, unspecified: Secondary | ICD-10-CM | POA: Insufficient documentation

## 2022-02-15 LAB — FERRITIN: Ferritin: 13 ng/mL (ref 11–307)

## 2022-02-15 LAB — CBC WITH DIFFERENTIAL/PLATELET
Abs Immature Granulocytes: 0.21 10*3/uL — ABNORMAL HIGH (ref 0.00–0.07)
Basophils Absolute: 0 10*3/uL (ref 0.0–0.1)
Basophils Relative: 0 %
Eosinophils Absolute: 0 10*3/uL (ref 0.0–0.5)
Eosinophils Relative: 0 %
HCT: 29.2 % — ABNORMAL LOW (ref 36.0–46.0)
Hemoglobin: 8.9 g/dL — ABNORMAL LOW (ref 12.0–15.0)
Immature Granulocytes: 1 %
Lymphocytes Relative: 11 %
Lymphs Abs: 2.5 10*3/uL (ref 0.7–4.0)
MCH: 26.5 pg (ref 26.0–34.0)
MCHC: 30.5 g/dL (ref 30.0–36.0)
MCV: 86.9 fL (ref 80.0–100.0)
Monocytes Absolute: 1.3 10*3/uL — ABNORMAL HIGH (ref 0.1–1.0)
Monocytes Relative: 6 %
Neutro Abs: 18.9 10*3/uL — ABNORMAL HIGH (ref 1.7–7.7)
Neutrophils Relative %: 82 %
Platelets: 401 10*3/uL — ABNORMAL HIGH (ref 150–400)
RBC: 3.36 MIL/uL — ABNORMAL LOW (ref 3.87–5.11)
RDW: 18.5 % — ABNORMAL HIGH (ref 11.5–15.5)
WBC: 23 10*3/uL — ABNORMAL HIGH (ref 4.0–10.5)
nRBC: 0.1 % (ref 0.0–0.2)

## 2022-02-15 LAB — IRON AND IRON BINDING CAPACITY (CC-WL,HP ONLY)
Iron: 15 ug/dL — ABNORMAL LOW (ref 28–170)
Saturation Ratios: 5 % — ABNORMAL LOW (ref 10.4–31.8)
TIBC: 323 ug/dL (ref 250–450)
UIBC: 308 ug/dL

## 2022-02-15 NOTE — Progress Notes (Signed)
[place] NOTE  Patient Care Team: Panosh, [name], [name] as PCP - General Ardis Hughs Melene Plan, [name] (Gastroenterology) Everett Graff, [name] (Obstetrics and Gynecology) Hennie Duos, [name] (Rheumatology) Georgia Lopes, DO as Consulting Physician [facility]) Nicholas Lose, [name] as Consulting Physician (Hematology and Oncology) Jackquline Denmark, [name] as Consulting Physician (Gastroenterology)  CHIEF COMPLAINTS/PURPOSE OF CONSULTATION:  Anemia  ASSESSMENT & PLAN:  No problem-specific Assessment & Plan notes found for this encounter.  Orders Placed This Encounter  Procedures   CBC with Differential/Platelet    Standing Status:   Standing    Number of Occurrences:   22    Standing Expiration Date:   02/16/2023   Iron and Iron Binding Capacity ([facility] only)    Standing Status:   Future    Standing Expiration Date:   02/16/2023   Ferritin    Standing Status:   Future    Standing Expiration Date:   02/15/2023   This is a very pleasant 41 year old female patient with Crohn's disease, severe anemia with multiple ER visits and hospitalizations recently, currently on prednisone 30 mg, previously on infliximab for the past several years with great response referred to hematology for recommendations regarding severe anemia.  Patient denies any new health complaints today.  She is doing quite well now.  She is taking oral iron once a day.  She denies any more black stools.  Physical examination today, obesity otherwise no findings concerning for severe anemia.  We have reviewed her vitals, no tachycardia or hypotension or hypoxia.   We have recommended repeating labs CBC, iron panel and ferritin. If she continues to have severe iron deficiency anemia, I would recommend considering intravenous iron given the severe anemia and multiple transfusion requirements.  She is agreeable to this.  We have discussed about IV iron in general  There are several formularies of intravenous iron  available in the market.  We have discussed about risk of allergic/infusion reactions including potentially life-threatening anaphylaxis with intravenous iron however these serious allergic reactions are exceedingly rare and overestimated.  In contrast to serious allergic reactions, IV iron may be associated with nonallergic infusion reactions including self-limited urticaria, palpitations, dizziness, neck and back spasm which again occur in less than 1% of the individuals and do not progress to more serious reactions.  She is agreeable to this plan.  She will return to clinic in about 8 weeks with repeat labs.  Thank you for consulting [name] in the care of this patient.  Please not hesitate contact us with any additional questions or concerns.   HISTORY OF PRESENTING ILLNESS:  [name] 41 y.o. female is here because of anemia and thrombocytosis.  This is a 41 year old with Crohn's disease status post colonic resection and currently on infliximab who was recently seen in the emergency room with syncopal episode was found to have Hemoccult positive and hemoglobin of 6.9, significant drop from hemoglobin of 12.1.  She was admitted to the hospital with GI bleeding and underwent upper GI endoscopy that was essentially negative and showed a small antral erosion.  She also underwent colonoscopy which showed multiple anastomotic ulcers with signs of Crohn's recurrence.  She was then given packed red blood cells and was discharged to also follow-up with hematology.  She is here for an initial visit. She is here on iron supplementation, once a day. She couldn't tolerate BID. She didn't have melena in 4/5 days. She feels better. She is on prednisone 30 mg po daily  for crohn's disease which has been helping. She is not on infliximab at this time. Before this flare, she didn't remember having flare since 2011. She wonders if the generic form of infliximab is not helping her and may be also taking the naproxen  might have exacerbated this flare.  Rest of the pertinent 10 point ROS reviewed and negative.  REVIEW OF SYSTEMS:   Constitutional: Denies fevers, chills or abnormal night sweats Eyes: Denies blurriness of vision, double vision or watery eyes Ears, nose, mouth, throat, and face: Denies mucositis or sore throat Respiratory: Denies cough, dyspnea or wheezes Cardiovascular: Denies palpitation, chest discomfort or lower extremity swelling Gastrointestinal:  Denies nausea, heartburn or change in bowel habits Skin: Denies abnormal skin rashes Lymphatics: Denies new lymphadenopathy or easy bruising Neurological:Denies numbness, tingling or new weaknesses Behavioral/Psych: Mood is stable, no new changes  All other systems were reviewed with the patient and are negative.  MEDICAL HISTORY:  Past Medical History:  Diagnosis Date   Abrasion of upper arm with infection, left, sequela    started antibiotics lef arm oct 4th started antibiotics healing well   Anemia    hx of 2 yrs ago   Chicken pox    Chronic rhinitis    Contact dermatitis and other eczema, due to unspecified cause    Crohn's 11-15-11   no issues in 2 yrs   Fibroids 11-15-11   remains with fibroids   Hernia    Incisional hernia, RLQ 10/02/2011   Infertility, female    Mononucleosis    Obesity    Pilonidal abscess    Rectal abscess    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[place])    Vitamin D deficiency     SURGICAL HISTORY: Past Surgical History:  Procedure Laterality Date   APPENDECTOMY  7/06   BIOPSY  01/25/2022   Procedure: BIOPSY;  Surgeon: Jackquline Denmark, [name];  Location: [facility];  Service: Gastroenterology;;   BIOPSY  01/26/2022   Procedure: BIOPSY;  Surgeon: Jackquline Denmark, [name];  Location: [facility];  Service: Gastroenterology;;   CESAREAN SECTION N/A 11/30/2020   Procedure: CESAREAN SECTION;  Surgeon: Everett Graff, [name];  Location: [facility];  Service: Obstetrics;  Laterality: N/A;   COLONOSCOPY WITH PROPOFOL N/A  03/07/2017   Procedure: COLONOSCOPY WITH PROPOFOL;  Surgeon: Milus Banister, [name];  [facility];  Service: Endoscopy;  Laterality: N/A;   COLONOSCOPY WITH PROPOFOL N/A 01/26/2022   Procedure: COLONOSCOPY WITH PROPOFOL;  Surgeon: Jackquline Denmark, [name];  Location: [facility];  Service: Gastroenterology;  Laterality: N/A;   ESOPHAGOGASTRODUODENOSCOPY (EGD) WITH PROPOFOL N/A 01/25/2022   Procedure: ESOPHAGOGASTRODUODENOSCOPY (EGD) WITH PROPOFOL;  Surgeon: Jackquline Denmark, [name];  Location[facility];  Service: Gastroenterology;  Laterality: N/A;   EYE SURGERY  11-15-11   2003-multiple stys   ileocecal resection  1/11   crohns disease with fisula/stricture. Dr. Ronnald Collum   INCISIONAL HERNIA REPAIR N/A 12/24/2018   Procedure: OPEN REPAIR VENTRAL INCISIONAL HERNIA WITH MESH PATCH AND PRIMARY REPAIR OF LEFT SPIGELIAN HERNIA;  Surgeon: Armandina Gemma, [name];  Location: [facility];  Service: General;  Laterality: N/A;   KNEE ARTHROSCOPY  99, 02   Bil.   MYOMECTOMY N/A 11/10/2014   Procedure: MYOMECTOMY;  Surgeon: Everett Graff, [name];  Location: [facility];  Service: Gynecology;  Laterality: N/A;   PILONIDAL CYST EXCISION  2011, 2012   I&Ds   VENTRAL HERNIA REPAIR  11/27/2011   Procedure: LAPAROSCOPIC VENTRAL HERNIA;  Surgeon: Adin Hector, [name];  Location: [facility];  Service: General;  Laterality: N/A;  Laparoscopic Exploration & Repair of Hernia in Abdomen   WISDOM TOOTH EXTRACTION      SOCIAL HISTORY: Social History   Socioeconomic History   Marital status: Married    Spouse name: Shenandoah Yeats   Number of children: Not on file   Years of education: Not on file   Highest education level: Bachelor's degree (e.g., BA, AB, BS)  Occupational History   Occupation: Music therapist  Tobacco Use   Smoking status: Never   Smokeless tobacco: Never  Vaping Use   Vaping Use: Never used  Substance and Sexual Activity   Alcohol use: No   Drug use: No   Sexual activity: Yes    Partners: Male  Other Topics Concern    Not on file  Social History Narrative   married   [place] of 2   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    No pets   Sleep 6-7 hours    BF with 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   Walking exercise   Eats balanced generally   Social Determinants of Health   Financial Resource Strain: Low Risk  (04/02/2021)   Overall Financial Resource Strain (CARDIA)    Difficulty of Paying Living Expenses: Not hard at all  Food Insecurity: No Food Insecurity (04/02/2021)   Hunger Vital Sign    Worried About Running Out of Food in the Last Year: Never true    [place] in the Last Year: Never true  Transportation Needs: No Transportation Needs (04/02/2021)   PRAPARE - Hydrologist (Medical): No    Lack of Transportation (Non-Medical): No  Physical Activity: Unknown (04/02/2021)   Exercise Vital Sign    Days of Exercise per Week: 0 days    Minutes of Exercise per Session: Not on file  Stress: No Stress Concern Present (04/02/2021)   [place]    Feeling of Stress : Not at all  Social Connections: [place] (04/02/2021)   Social Connection and Isolation Panel [NHANES]    Frequency of Communication with Friends and Family: More than three times a week    Frequency of Social Gatherings with Friends and Family: Patient refused    Attends Religious Services: More than 4 times per year    Active Member of [employer] or Organizations: Yes    Attends Archivist Meetings: 1 to 4 times per year    Marital Status: Married  Human resources officer Violence: Not on file    FAMILY HISTORY: Family History  Problem Relation Age of Onset   Aneurysm Mother        brain   Heart disease Mother 48       heart anyrism   Healthy Father        fairly   Diabetes Sister        half sister   Bipolar disorder Sister        half sister   Colon cancer Maternal Grandmother    Stomach cancer Paternal  Grandfather    Multiple sclerosis Sister     ALLERGIES:  has No Known Allergies.  MEDICATIONS:  Current Outpatient Medications  Medication Sig Dispense Refill   Calcium Carb-Cholecalciferol (CALCIUM/VITAMIN D) 600-400 MG-UNIT TABS Take 1 tablet by mouth at bedtime.     Cholecalciferol (VITAMIN D3) 25 MCG (1000 UT) CAPS Take 1,000 Units by mouth daily with supper.  fexofenadine (ALLEGRA) 180 MG tablet Take 180 mg by mouth at bedtime.     Iron, Ferrous Sulfate, 325 (65 Fe) MG TABS Take 325 mg by mouth 2 (two) times daily with a meal.     metFORMIN (GLUCOPHAGE) 500 MG tablet Take 1 tablet (500 mg total) by mouth 2 (two) times daily with a meal. 60 tablet 0   pantoprazole (PROTONIX) 40 MG tablet Take 1 tablet (40 mg total) by mouth daily at 6 (six) AM. 30 tablet 0   [START ON 03/15/2022] predniSONE (DELTASONE) 10 MG tablet Take 1 tablet (10 mg total) by mouth daily with breakfast for 14 days. (Patient not taking: Reported on 02/09/2022) 14 tablet 0   [START ON 02/28/2022] predniSONE (DELTASONE) 20 MG tablet Take 1 tablet (20 mg total) by mouth daily with breakfast for 14 days. (Patient not taking: Reported on 02/09/2022) 14 tablet 0   Prenatal Vit-Fe Fumarate-FA (PRENATAL MULTIVITAMIN) TABS tablet Take 1 tablet by mouth at bedtime.     No current facility-administered medications for this visit.     PHYSICAL EXAMINATION: ECOG PERFORMANCE STATUS: 0 - Asymptomatic  Vitals:   02/15/22 1013  BP: 113/62  Pulse: 90  Resp: 16  Temp: 97.7 F (36.5 C)  SpO2: 99%   Filed Weights   02/15/22 1013  Weight: 258 lb (117 kg)    GENERAL:alert, no distress and comfortable, obese SKIN: skin color, texture, turgor are normal, no rashes or significant lesions EYES: normal, conjunctiva are pink and non-injected, sclera clear OROPHARYNX:no exudate, no erythema and lips, buccal mucosa, and tongue normal  NECK: supple, thyroid normal size, non-tender, without nodularity LYMPH:  no palpable  lymphadenopathy in the cervical, axillary or inguinal LUNGS: clear to auscultation and percussion with normal breathing effort HEART: regular rate & rhythm and no murmurs and no lower extremity edema ABDOMEN:abdomen soft, non-tender and normal bowel sounds Musculoskeletal:no cyanosis of digits and no clubbing  PSYCH: alert & oriented x 3 with fluent speech NEURO: no focal motor/sensory deficits  LABORATORY DATA:  I have reviewed the data as listed Lab Results  Component Value Date   WBC 29.0 (H) 02/09/2022   HGB 7.4 (L) 02/09/2022   HCT 24.9 (L) 02/09/2022   MCV 89.2 02/09/2022   PLT 555 (H) 02/09/2022     Chemistry      Component Value Date/Time   NA 138 02/09/2022 1503   NA 140 12/26/2021 0946   K 4.6 02/09/2022 1503   CL 104 02/09/2022 1503   CO2 27 02/09/2022 1503   BUN 15 02/09/2022 1503   BUN 16 12/26/2021 0946   CREATININE 0.71 02/09/2022 1503      Component Value Date/Time   CALCIUM 8.9 02/09/2022 1503   CALCIUM 7.6 (L) 01/25/2022 0328   ALKPHOS 56 02/09/2022 1503   AST 14 (L) 02/09/2022 1503   ALT 16 02/09/2022 1503   BILITOT 0.3 02/09/2022 1503   BILITOT 0.3 12/26/2021 0946       RADIOGRAPHIC STUDIES: I have personally reviewed the radiological images as listed and agreed with the findings in the report. VAS [name] UPPER EXTREMITY VENOUS DUPLEX  Result Date: 01/28/2022 UPPER VENOUS STUDY  Patient Name:  [name]  Date of Exam:   01/28/2022 Medical Rec #: [id number]           Accession #:    [id number] Date of Birth: 11-Aug-1980          Patient Gender: F Patient Age:   3 years Exam Location:  [facility] Procedure:      VAS [name] UPPER EXTREMITY VENOUS DUPLEX Referring Phys: CAROLE HALL --------------------------------------------------------------------------------  Indications: Erythema, swelling, s/p IV Comparison Study: 01-25-2022 Prior bilateral lower extremity venous was negative                   for DVT. Performing Technologist: Darlin Coco  RDMS, RVT  Examination Guidelines: A complete evaluation includes B-mode imaging, spectral Doppler, color Doppler, and power Doppler as needed of all accessible portions of each vessel. Bilateral testing is considered an integral part of a complete examination. Limited examinations for reoccurring indications may be performed as noted.  Right Findings: +----------+------------+---------+-----------+----------+--------------------+ RIGHT     CompressiblePhasicitySpontaneousProperties      Summary        +----------+------------+---------+-----------+----------+--------------------+ IJV           Full       Yes       Yes                                   +----------+------------+---------+-----------+----------+--------------------+ Subclavian               Yes       Yes                                   +----------+------------+---------+-----------+----------+--------------------+ Axillary      Full       Yes       Yes                                   +----------+------------+---------+-----------+----------+--------------------+ Brachial      Full                                                       +----------+------------+---------+-----------+----------+--------------------+ Radial        Full                                                       +----------+------------+---------+-----------+----------+--------------------+ Ulnar         Full                                                       +----------+------------+---------+-----------+----------+--------------------+ Cephalic      None       No        No                Acute-AC to distal                                                            forearm        +----------+------------+---------+-----------+----------+--------------------+  Basilic       Full                                                        +----------+------------+---------+-----------+----------+--------------------+  Left Findings: +----------+------------+---------+-----------+----------+-------+ LEFT      CompressiblePhasicitySpontaneousPropertiesSummary +----------+------------+---------+-----------+----------+-------+ Subclavian               Yes       Yes                      +----------+------------+---------+-----------+----------+-------+  Summary:  Right: No evidence of deep vein thrombosis in the upper extremity. Findings consistent with acute superficial vein thrombosis involving the right cephalic vein.  Left: No evidence of thrombosis in the subclavian.  *See table(s) above for measurements and observations.  Diagnosing physician: Monica Martinez [name] Electronically signed by Monica Martinez [name] on 01/28/2022 at 3:45:37 PM.    Final    MR KNEE RIGHT WO CONTRAST  Result Date: 01/28/2022 CLINICAL DATA:  Knee trauma, internal derangement suspected, xray done EXAM: MRI OF THE RIGHT KNEE WITHOUT CONTRAST TECHNIQUE: Multiplanar, multisequence MR imaging of the knee was performed. No intravenous contrast was administered. COMPARISON:  Radiograph 01/27/2022 FINDINGS: Degraded image quality due to motion artifact and aliasing. MENISCI Medial: Intrasubstance degeneration.  No definitive tear. Lateral: Intrasubstance degeneration.  No definitive tear. LIGAMENTS Cruciates: ACL and PCL are intact. Collaterals: Periligamentous edema along the medial collateral ligament, which is otherwise intact. Lateral collateral ligamentous complex is intact. CARTILAGE Patellofemoral: Concave lateral patellar facet with full-thickness cartilage loss. 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 Medial:  No focal chondral defect. Lateral: Intermediate grade partial in the cartilage loss along the anterolateral femoral condyle. JOINT: Large joint effusion with large ossified joint body along the superolateral aspect measuring 1.6 cm (series 14  image 11). POPLITEAL FOSSA: Miniscule Baker's cyst. EXTENSOR MECHANISM: Intact quadriceps tendon. Intact patellar tendon. BONES: Tricompartment osteophyte formation. No evidence of acute fracture. No aggressive osseous lesion. Other: Multiple ganglion cysts along the posterior joint capsule. 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 Large joint effusion with ossified joint body along the superolateral aspect measuring 1.6 cm. Intrasubstance degeneration without definitive meniscus tear. Grade 1 medial collateral ligament sprain. Electronically Signed   By: Maurine Simmering M.D.   On: 01/28/2022 09:49   DG Knee 4 Views W/Patella Right  Result Date: 01/27/2022 CLINICAL DATA:  Anterior and medial right knee pain since a fall 2 days ago. EXAM: RIGHT KNEE - COMPLETE 4+ VIEW COMPARISON:  Right knee radiographs 01/25/2022 FINDINGS: No acute fracture or dislocation is identified. There is a moderately large knee joint effusion which has greatly increased in size since the prior study. Moderate joint space narrowing and moderate marginal osteophytosis involve the patellofemoral compartment. Milder marginal osteophytosis is noted in the medial and lateral compartments where joint space width is preserved. A 2 cm loose body again projects in the region of the lateral patellofemoral joint space. IMPRESSION: 1. Increased, moderately large knee joint effusion. No acute fracture identified. 2. Tricompartmental osteoarthrosis, greatest in the patellofemoral compartment with an unchanged loose body. Electronically Signed   By: Logan Bores M.D.   On: 01/27/2022 11:15   ECHOCARDIOGRAM COMPLETE  Result Date: 01/25/2022    ECHOCARDIOGRAM REPORT   Patient Name:   [name] Date  of Exam: 01/25/2022 Medical Rec #:  [id number]          Height:       62.5 in Accession #:    [id number]         Weight:       264.1 lb Date of Birth:  [birth year]          BSA:          2.164 m Patient Age:    26 years           BP:           124/57 mmHg Patient Gender: F                  HR:           79 bpm. Exam Location:  Inpatient Procedure: 2D Echo Indications:    Syncope  History:        Patient has no prior history of Echocardiogram examinations.                 Signs/Symptoms:Syncope; Risk Factors:Non-Smoker.  Sonographer:    Harvie Junior Referring Phys: [id number] Jonnie Finner  Sonographer Comments: Patient is obese. Image acquisition challenging due to patient body habitus. IMPRESSIONS  1. Left ventricular ejection fraction, by estimation, is 60 to 65%. Left ventricular ejection fraction by 2D MOD biplane is 62.8 %. Left ventricular ejection fraction by PLAX is 61 %. The left ventricle has normal function. The left ventricle has no regional wall motion abnormalities. Left ventricular diastolic parameters were normal.  2. Right ventricular systolic function is normal. The right ventricular size is normal. There is normal pulmonary artery systolic pressure.  3. The mitral valve is normal in structure. Trivial mitral valve regurgitation. No evidence of mitral stenosis.  4. The aortic valve is tricuspid. Aortic valve regurgitation is not visualized. No aortic stenosis is present.  5. The inferior vena cava is normal in size with greater than 50% respiratory variability, suggesting right atrial pressure of 3 mmHg. FINDINGS  Left Ventricle: Left ventricular ejection fraction, by estimation, is 60 to 65%. Left ventricular ejection fraction by PLAX is 61 %. Left ventricular ejection fraction by 2D MOD biplane is 62.8 %. The left ventricle has normal function. The left ventricle has no regional wall motion abnormalities. The left ventricular internal cavity size was normal in size. There is no left ventricular hypertrophy. Left ventricular diastolic parameters were normal. Normal left ventricular filling pressure. Right Ventricle: The right ventricular size is normal. No  increase in right ventricular wall thickness. Right ventricular systolic function is normal. There is normal pulmonary artery systolic pressure. The tricuspid regurgitant velocity is 1.89 m/s, and  with an assumed right atrial pressure of 3 mmHg, the estimated right ventricular systolic pressure is 26.8 mmHg. Left Atrium: Left atrial size was normal in size. Right Atrium: Right atrial size was normal in size. Pericardium: There is no evidence of pericardial effusion. Mitral Valve: The mitral valve is normal in structure. Mild mitral annular calcification. Trivial mitral valve regurgitation. No evidence of mitral valve stenosis. Tricuspid Valve: The tricuspid valve is normal in structure. Tricuspid valve regurgitation is not demonstrated. No evidence of tricuspid stenosis. Aortic Valve: The aortic valve is tricuspid. Aortic valve regurgitation is not visualized. No aortic stenosis is present. Aortic valve mean gradient measures 5.0 mmHg. Aortic valve peak gradient measures 7.8 mmHg. Aortic valve area, by VTI measures 2.17 cm. Pulmonic Valve: The pulmonic valve was normal in structure. Pulmonic valve regurgitation  is not visualized. No evidence of pulmonic stenosis. Aorta: The aortic root is normal in size and structure. Venous: The inferior vena cava is normal in size with greater than 50% respiratory variability, suggesting right atrial pressure of 3 mmHg. IAS/Shunts: No atrial level shunt detected by color flow Doppler.  LEFT VENTRICLE PLAX 2D                        Biplane EF (MOD) LV EF:         Left            LV Biplane EF:   Left                ventricular                      ventricular                ejection                         ejection                fraction by                      fraction by                PLAX is 61                       2D MOD                %.                               biplane is LVIDd:         4.60 cm                          62.8 %. LVIDs:         3.10 cm LV PW:          0.80 cm         Diastology LV IVS:        0.90 cm         LV e' medial:    11.70 cm/s LVOT diam:     2.00 cm         LV E/e' medial:  7.4 LV SV:         65              LV e' lateral:   16.80 cm/s LV SV Index:   30              LV E/e' lateral: 5.1 LVOT Area:     3.14 cm  LV Volumes (MOD) LV vol d, MOD    93.1 ml A2C: LV vol d, MOD    111.0 ml A4C: LV vol s, MOD    36.4 ml A2C: LV vol s, MOD    40.4 ml A4C: LV SV MOD A2C:   56.7 ml LV SV MOD A4C:   111.0 ml LV SV MOD BP:    67.2 ml RIGHT VENTRICLE RV Basal diam:  3.20 cm RV Mid diam:    2.40 cm LEFT ATRIUM             Index  RIGHT ATRIUM           Index LA diam:        3.50 cm 1.62 cm/m   RA Area:     11.10 cm LA Vol (A2C):   29.3 ml 13.54 ml/m  RA Volume:   21.90 ml  10.12 ml/m LA Vol (A4C):   19.9 ml 9.19 ml/m LA Biplane Vol: 25.6 ml 11.83 ml/m  AORTIC VALVE                     PULMONIC VALVE AV Area (Vmax):    2.19 cm      PV Vmax:       1.05 m/s AV Area (Vmean):   2.22 cm      PV Peak grad:  4.4 mmHg AV Area (VTI):     2.17 cm AV Vmax:           140.00 cm/s AV Vmean:          102.000 cm/s AV VTI:            0.300 m AV Peak Grad:      7.8 mmHg AV Mean Grad:      5.0 mmHg LVOT Vmax:         97.50 cm/s LVOT Vmean:        72.000 cm/s LVOT VTI:          0.207 m LVOT/AV VTI ratio: 0.69  AORTA Ao Root diam: 2.70 cm Ao Asc diam:  2.60 cm MITRAL VALVE               TRICUSPID VALVE MV Area (PHT): 2.95 cm    TR Peak grad:   14.3 mmHg MV Decel Time: 257 msec    TR Vmax:        189.00 cm/s MR Peak grad: 44.6 mmHg MR Vmax:      334.00 cm/s  SHUNTS MV E velocity: 86.50 cm/s  Systemic VTI:  0.21 m MV A velocity: 55.70 cm/s  Systemic Diam: 2.00 cm MV E/A ratio:  1.55 [name] Electronically signed by [name] Signature Date/Time: 01/25/2022/6:08:12 PM    Final    DG Knee 1-2 Views Right  Result Date: 01/25/2022 CLINICAL DATA:  Right knee pain for 1-2 days.  No known injury. EXAM: RIGHT KNEE - 1-2 VIEW COMPARISON:  None Available.  FINDINGS: Severe patellofemoral joint space narrowing with moderate peripheral degenerative osteophytes. Small joint effusion. There is a 2.3 cm mineralized loose body just lateral to the superior aspect of the lateral femoral condyle, likely within the lateral patellofemoral joint space. Moderate peripheral lateral femoral condyle and mild peripheral lateral tibial plateau degenerative osteophytes. The medial and lateral compartment joint spaces are maintained. No acute fracture or dislocation. IMPRESSION: 1. Severe patellofemoral osteoarthritis. 2. Superolateral knee loose body measuring up to 2.3 cm. Electronically Signed   By: Yvonne Kendall M.D.   On: 01/25/2022 17:50   DG Ankle 2 Views Left  Result Date: 01/25/2022 CLINICAL DATA:  Left ankle pain for 1-2 days. EXAM: LEFT ANKLE - 2 VIEW COMPARISON:  None Available. FINDINGS: Normal bone mineralization. The ankle mortise is symmetric and intact. Minimal distal medial malleolar degenerative spurring. No acute fracture is seen. No dislocation. Joint spaces are preserved. IMPRESSION: No significant osteoarthritis.  No acute fracture. Electronically Signed   By: Yvonne Kendall M.D.   On: 01/25/2022 17:49   CT Angio Abd/Pel w/ and/or w/o  Result Date: 01/25/2022 CLINICAL DATA:  Concern  for GI bleeding. History of ileal cecal Crohn's disease. EXAM: CT ANGIOGRAPHY ABDOMEN AND PELVIS WITH CONTRAST AND WITHOUT CONTRAST TECHNIQUE: Multidetector CT imaging of the abdomen and pelvis was performed using the standard protocol during bolus administration of intravenous contrast. Multiplanar reconstructed images and MIPs were obtained and reviewed to evaluate the vascular anatomy. RADIATION DOSE REDUCTION: This exam was performed according to the departmental dose-optimization program which includes automated exposure control, adjustment of the mA and/or kV according to patient size and/or use of iterative reconstruction technique. CONTRAST:  16m OMNIPAQUE IOHEXOL 350  MG/ML SOLN COMPARISON:  08/25/2018 FINDINGS: VASCULAR Aorta: Intact aorta. No acute aortic process, aneurysm, dissection, or surrounding inflammatory change. No retroperitoneal hemorrhage or hematoma. Celiac: Widely patent origin including its branches. No active bleeding within the GI tract throughout the celiac territory. SMA: Widely patent origin including its central mesenteric branches. No active arterial GI bleeding in the SMA vascular territory. Renals: Single renal arteries appear widely patent. IMA: IMA origin remains patent off the distal aorta including its branches. No active arterial bleeding in the IMA vascular territory. Inflow: Patent without evidence of aneurysm, dissection, vasculitis or significant stenosis. Proximal Outflow: Bilateral common femoral and visualized portions of the superficial and profunda femoral arteries are patent without evidence of aneurysm, dissection, vasculitis or significant stenosis. Veins: Venous phase imaging not performed Review of the MIP images confirms the above findings. NON-VASCULAR Lower chest: Scattered bibasilar atelectasis. No pericardial pleural effusion. Prominent heart size. Hepatobiliary: No focal liver abnormality is seen. No gallstones, gallbladder wall thickening, or biliary dilatation. Pancreas: Unremarkable. No pancreatic ductal dilatation or surrounding inflammatory changes. Spleen: Normal in size without focal abnormality. Adrenals/Urinary Tract: Adrenal glands are unremarkable. Kidneys are normal, without renal calculi, focal lesion, or hydronephrosis. Bladder is collapsed by Foley catheter. Stomach/Bowel: Negative for bowel obstruction, significant dilatation, ileus, or free air. Small lateral right lower quadrant abdominal wall hernia containing a fluid distended loop of small bowel but without obstruction or incarceration. Similar appearance. Postop changes of the cecum again noted. No free fluid, fluid collection, hemorrhage, hematoma, abscess  or ascites. Lymphatic: No bulky adenopathy. Reproductive: Uterine fibroids again noted. No adnexal abnormality. No free fluid, fluid collection, or hemorrhage in the pelvis. Other: Midline fat containing ventral hernia above the umbilicus. This is slightly enlarged. Previous left lower quadrant spigelian abdominal wall hernia no longer evident, suspect surgical repair. Musculoskeletal: No acute or significant osseous finding. IMPRESSION: VASCULAR Negative for acute arterial GI bleeding in the abdomen or pelvis. No other acute vascular process. NON-VASCULAR No other acute intra-abdominal or pelvic finding by CTA. Slight enlargement of the midline fat containing ventral hernia. Stable chronic and postoperative findings as above. Electronically Signed   By: MJerilynn Mages  Shick M.D.   On: 01/25/2022 15:22   VAS [place]OWER EXTREMITY VENOUS (DVT)  Result Date: 01/25/2022  Lower Venous DVT Study Patient Name:  [name] Date of Exam:   01/25/2022 Medical Rec #: [id number]          Accession #:    [id number] of Birth: 1May 11, 1982         Patient Gender: F Patient Age:   89years [facility]      VAS [place] EXTREMITY VENOUS (DVT) Referring Phys: TCherylann Ratel--------------------------------------------------------------------------------  Indications: Elevated Ddimer.  Risk Factors: None identified. Limitations: Body habitus and poor ultrasound/tissue interface. Comparison Study: No prior studies. Performing Technologist: GOliver HumRVT  Examination Guidelines: A complete evaluation includes B-mode  imaging, spectral Doppler, color Doppler, and power Doppler as needed of all accessible portions of each vessel. Bilateral testing is considered an integral part of a complete examination. Limited examinations for reoccurring indications may be performed as noted. The reflux portion of the exam is performed with the patient in reverse Trendelenburg.   +---------+---------------+---------+-----------+----------+--------------+ RIGHT    CompressibilityPhasicitySpontaneityPropertiesThrombus Aging +---------+---------------+---------+-----------+----------+--------------+ CFV      Full           Yes      Yes                                 +---------+---------------+---------+-----------+----------+--------------+ SFJ      Full                                                        +---------+---------------+---------+-----------+----------+--------------+ FV Prox  Full                                                        +---------+---------------+---------+-----------+----------+--------------+ FV Mid   Full                                                        +---------+---------------+---------+-----------+----------+--------------+ FV DistalFull           Yes      Yes                                 +---------+---------------+---------+-----------+----------+--------------+ PFV      Full                                                        +---------+---------------+---------+-----------+----------+--------------+ POP      Full           Yes      Yes                                 +---------+---------------+---------+-----------+----------+--------------+ PTV      Full                                                        +---------+---------------+---------+-----------+----------+--------------+ PERO     Full                                                        +---------+---------------+---------+-----------+----------+--------------+   +---------+---------------+---------+-----------+----------+--------------+  LEFT     CompressibilityPhasicitySpontaneityPropertiesThrombus Aging +---------+---------------+---------+-----------+----------+--------------+ CFV      Full           Yes      Yes                                  +---------+---------------+---------+-----------+----------+--------------+ SFJ      Full                                                        +---------+---------------+---------+-----------+----------+--------------+ FV Prox  Full                                                        +---------+---------------+---------+-----------+----------+--------------+ FV Mid   Full                                                        +---------+---------------+---------+-----------+----------+--------------+ FV DistalFull                                                        +---------+---------------+---------+-----------+----------+--------------+ PFV      Full                                                        +---------+---------------+---------+-----------+----------+--------------+ POP      Full           Yes      Yes                                 +---------+---------------+---------+-----------+----------+--------------+ PTV      Full                                                        +---------+---------------+---------+-----------+----------+--------------+ PERO     Full                                                        +---------+---------------+---------+-----------+----------+--------------+     Summary: RIGHT: - There is no evidence of deep vein thrombosis in the lower extremity. However, portions of this examination were limited- see technologist comments above.  - No cystic structure found in the popliteal fossa.  LEFT: - There is no evidence  of deep vein thrombosis in the lower extremity. However, portions of this examination were limited- see technologist comments above.  - No cystic structure found in the popliteal fossa.  *See table(s) above for measurements and observations. Electronically signed by Orlie Pollen on 01/25/2022 at 1:57:49 PM.    Final    CT Angio Chest PE W and/or Wo Contrast  Result Date: 01/24/2022 CLINICAL  DATA:  Syncope, positive D-dimer EXAM: CT ANGIOGRAPHY CHEST WITH CONTRAST TECHNIQUE: Multidetector CT imaging of the chest was performed using the standard protocol during bolus administration of intravenous contrast. Multiplanar CT image reconstructions and MIPs were obtained to evaluate the vascular anatomy. RADIATION DOSE REDUCTION: This exam was performed according to the departmental dose-optimization program which includes automated exposure control, adjustment of the mA and/or kV according to patient size and/or use of iterative reconstruction technique. CONTRAST:  179m OMNIPAQUE IOHEXOL 350 MG/ML SOLN COMPARISON:  Chest radiograph done earlier today FINDINGS: Cardiovascular: There is homogeneous enhancement in thoracic aorta. There are no intraluminal filling defects in central pulmonary artery branches. Evaluation of small peripheral branches he is limited by motion artifacts. Mediastinum/Nodes: No significant lymphadenopathy is seen. Lungs/Pleura: There is no focal pulmonary consolidation. There is no pleural effusion or pneumothorax. Upper Abdomen: Unremarkable. Musculoskeletal: Unremarkable. Review of the MIP images confirms the above findings. IMPRESSION: There is no evidence of central pulmonary artery embolism. There is no evidence of thoracic aortic dissection. There is no focal pulmonary consolidation. Electronically Signed   By: PElmer PickerM.D.   On: 01/24/2022 14:59   CT Head Wo Contrast  Result Date: 01/24/2022 CLINICAL DATA:  Provided history: Headache, syncope. Additional history provided: Head trauma. EXAM: CT HEAD WITHOUT CONTRAST TECHNIQUE: Contiguous axial images were obtained from the base of the skull through the vertex without intravenous contrast. RADIATION DOSE REDUCTION: This exam was performed according to the departmental dose-optimization program which includes automated exposure control, adjustment of the mA and/or kV according to patient size and/or use of iterative  reconstruction technique. COMPARISON:  None Available. FINDINGS: Brain: Cerebral volume is normal. There is no acute intracranial hemorrhage. No demarcated cortical infarct. No extra-axial fluid collection. No evidence of an intracranial mass. No midline shift. Vascular: No hyperdense vessel. Skull: No fracture or aggressive osseous lesion. Sinuses/Orbits: No mass or acute finding within the imaged orbits. No significant paranasal sinus disease. Other: Right parietal scalp hematoma. IMPRESSION: No evidence of acute intracranial abnormality. Right parietal scalp hematoma. Electronically Signed   By: KKellie SimmeringD.O.   On: 01/24/2022 14:59   DG Chest 1 View  Result Date: 01/24/2022 CLINICAL DATA:  Syncope EXAM: CHEST  1 VIEW COMPARISON:  Chest radiograph 11/09/2010 FINDINGS: The cardiomediastinal silhouette is normal. Lung volumes are significantly diminished. There is no focal consolidation or pulmonary edema. There is no pleural effusion or pneumothorax There is no acute osseous abnormality. IMPRESSION: Low lung volumes. Otherwise, no radiographic evidence of acute cardiopulmonary process. Electronically Signed   By: PValetta MoleM.D.   On: 01/24/2022 12:57    All questions were answered. The patient knows to call the clinic with any problems, questions or concerns. I spent 45 minutes in the care of this patient including H and P, review of records, counseling and coordination of care.     PBenay Pike [name] 02/15/2022 10:44 AM

## 2022-02-16 ENCOUNTER — Other Ambulatory Visit: Payer: Self-pay | Admitting: Hematology and Oncology

## 2022-02-16 ENCOUNTER — Encounter: Payer: Self-pay | Admitting: Hematology and Oncology

## 2022-02-19 ENCOUNTER — Other Ambulatory Visit: Payer: Self-pay | Admitting: *Deleted

## 2022-02-20 ENCOUNTER — Other Ambulatory Visit: Payer: Self-pay | Admitting: *Deleted

## 2022-02-20 ENCOUNTER — Other Ambulatory Visit: Payer: Self-pay

## 2022-02-20 DIAGNOSIS — D649 Anemia, unspecified: Secondary | ICD-10-CM

## 2022-02-20 DIAGNOSIS — K5090 Crohn's disease, unspecified, without complications: Secondary | ICD-10-CM

## 2022-02-20 DIAGNOSIS — R197 Diarrhea, unspecified: Secondary | ICD-10-CM

## 2022-02-21 LAB — INFLIXIMAB+AB (SERIAL MONITOR)
Anti-Infliximab Antibody: <22 ng/mL
Infliximab Drug Level: 2.2 ug/mL

## 2022-02-21 LAB — SERIAL MONITORING

## 2022-02-21 LAB — CALPROTECTIN, FECAL: Calprotectin, Fecal: 166 ug/g — ABNORMAL HIGH (ref 0–120)

## 2022-02-22 ENCOUNTER — Telehealth: Payer: Self-pay | Admitting: Hematology and Oncology

## 2022-02-22 NOTE — Telephone Encounter (Signed)
Called patient to confirm upcoming appointment. Patient is aware.

## 2022-02-23 ENCOUNTER — Inpatient Hospital Stay: Payer: BC Managed Care – PPO

## 2022-02-23 VITALS — BP 117/58 | HR 91 | Temp 98.0°F | Resp 18

## 2022-02-23 DIAGNOSIS — D509 Iron deficiency anemia, unspecified: Secondary | ICD-10-CM

## 2022-02-23 DIAGNOSIS — D649 Anemia, unspecified: Secondary | ICD-10-CM | POA: Diagnosis not present

## 2022-02-23 MED ORDER — SODIUM CHLORIDE 0.9 % IV SOLN
200.0000 mg | Freq: Once | INTRAVENOUS | Status: AC
  Administered 2022-02-23: 200 mg via INTRAVENOUS
  Filled 2022-02-23: qty 200

## 2022-02-23 MED ORDER — SODIUM CHLORIDE 0.9 % IV SOLN: Freq: Once | INTRAVENOUS | Status: AC

## 2022-02-23 NOTE — Patient Instructions (Signed)

## 2022-02-25 ENCOUNTER — Encounter: Payer: Self-pay | Admitting: Gastroenterology

## 2022-02-26 ENCOUNTER — Inpatient Hospital Stay: Payer: BC Managed Care – PPO | Attending: Hematology and Oncology

## 2022-02-26 ENCOUNTER — Telehealth: Payer: Self-pay

## 2022-02-26 VITALS — BP 124/68 | HR 88 | Temp 98.0°F | Resp 18

## 2022-02-26 DIAGNOSIS — D509 Iron deficiency anemia, unspecified: Secondary | ICD-10-CM | POA: Insufficient documentation

## 2022-02-26 MED ORDER — SODIUM CHLORIDE 0.9 % IV SOLN: Freq: Once | INTRAVENOUS | Status: AC

## 2022-02-26 MED ORDER — SODIUM CHLORIDE 0.9 % IV SOLN
200.0000 mg | Freq: Once | INTRAVENOUS | Status: AC
  Administered 2022-02-26: 200 mg via INTRAVENOUS
  Filled 2022-02-26: qty 200

## 2022-02-26 NOTE — Telephone Encounter (Signed)
Patient's therapeutic drug monitoring shows that she does not have high enough levels to likely induce a remission. We are going to plan a 4-week transition for her biologic therapy.  She will continue at 10 mg/kg but moved to every 4 weeks. We will ask her insurance to see if they would potentially let her go back to Remicade rather than Avsola but the patient is aware that insurance company may defer on this but it is worth asking at least. She has a clinic visit coming up soon.   Patty, Lets plan to go ahead and get her set up for every 4 week infusions. Also, please send to our coordinators the asking of her insurance as to whether we can transition back from Avsola to Remicade since it seems that her current medications from a therapeutic drug monitoring standpoint are not likely inducing her.  If we cannot get approval then the plan of action to remain on Avsola is fine for now. Thanks. GM

## 2022-02-26 NOTE — Progress Notes (Signed)
Patient declined to stay 30 minute post observation. VSS at discharge. Patient ambulatory to lobby.

## 2022-02-26 NOTE — Telephone Encounter (Signed)
Patient Message 02/25/2022 [facility]  Milus Banister, MD Gastroenterology   All Conversations: Reply to comment on test results  [facility] Message First) February 25, 2022 [name] to [facility] (supporting Milus Banister, MD)       02/25/22  9:30 PM To Donna Britain, MD   Statement: This means that your current dosing regimen is not adequate enough in getting your body to have levels that are higher in likelihood to get you into remission.    (Question - Does this mean that I have not been in remission for the last 10 years?   I have not had any Crohn's flare-ups that I was aware.  No pain, no issues with bowel movements.  Currently, I am in no pain, bowel movements normal, no foods causing irritation)   Statement: As such, the next step in your treatment would be to strongly consider moving forward with 4-week [name] to try to get more drug in you and at the same time try to get your therapeutic drug trough level to a higher level overall.  If this fails over the course of the next few months then we would have to strongly consider switching medications to get you under control.     (Response - I am okay with 4 weeks)     Statement: It is not clear that the insurance company will allow [name] to transition your Avsola to Remicade either, so I would just continue Avsola for now.   (Question - Is there a reason that the insurance would not allow a transition to Remiciade?  Even still, could we still try and see?)   Thanks.  I will be in the office on Wednesday morning.     [name]  February 26, 2022       02/26/22  8:35 AM You routed this conversation to Mansouraty, Telford Nab., MD  Mansouraty, Telford Nab., MD to [name]       02/26/22  3:30 PM Dear [name],   Answers to your questions: 1) it does not necessarily mean that you have not been in remission for the last 10 years.  What it tells [name] is that at this moment in  time, your levels are not high enough to likely get you into a full remission 2) every 4 weeks makes good sense at least to try to get more medication in you and in theory try to salvage and keep this biologic agent in your armamentarium for now 3) because of it being a biosimilar, it would likely be dismissed but we can certainlly see if they will give approval or not.  I would not want to stop therapy plans while waiting to get a final decision but we can have my team work on that. See you in clinic soon. Good luck and good health.   Donna Britain, MD  This MyChart message has not been read. Mansouraty, Telford Nab., MD      02/26/22  3:33 PM Note Patient's therapeutic drug monitoring shows that she does not have high enough levels to likely induce a remission. We are going to plan a 4-week transition for her biologic therapy.  She will continue at 10 mg/kg but moved to every 4 weeks. We will ask her insurance to see if they would potentially let her go back to Remicade rather than Avsola but the patient is aware that insurance company may defer on this but it is worth asking at least.  She has a clinic visit coming up soon.     [name]. Also, please send to our coordinators the asking of her insurance as to whether we can transition back from Avsola to Remicade since it seems that her current medications from a therapeutic drug monitoring standpoint are not likely inducing her.  If we cannot get approval then the plan of action to remain on Avsola is fine for now. Thanks. GM

## 2022-02-26 NOTE — Telephone Encounter (Signed)
Order sent to Dr Amil Amen to increase Avsola to every 4 weeks.     Monchell can you please review for transition back to Remicade from Avsola due to [place] not being therapeutic?

## 2022-02-26 NOTE — Patient Instructions (Signed)

## 2022-02-28 ENCOUNTER — Other Ambulatory Visit (HOSPITAL_COMMUNITY): Payer: Self-pay

## 2022-02-28 ENCOUNTER — Ambulatory Visit: Payer: BC Managed Care – PPO | Admitting: Nurse Practitioner

## 2022-02-28 ENCOUNTER — Other Ambulatory Visit: Payer: Self-pay

## 2022-02-28 ENCOUNTER — Encounter: Payer: Self-pay | Admitting: Nurse Practitioner

## 2022-02-28 ENCOUNTER — Telehealth: Payer: Self-pay | Admitting: Pharmacy Technician

## 2022-02-28 ENCOUNTER — Other Ambulatory Visit: Payer: BC Managed Care – PPO

## 2022-02-28 ENCOUNTER — Encounter: Payer: Self-pay | Admitting: Hematology and Oncology

## 2022-02-28 VITALS — BP 118/78 | HR 88 | Ht 63.0 in | Wt 262.0 lb

## 2022-02-28 DIAGNOSIS — K5000 Crohn's disease of small intestine without complications: Secondary | ICD-10-CM | POA: Diagnosis not present

## 2022-02-28 NOTE — Progress Notes (Signed)
Chief Complaint:  Hospital follow up on Crohn's / GI bleed   Assessment &  Plan   #41 year old female with longstanding Crohn's ileocolitis, status post ileocecectomy in 2011. Recent admission for GI bleed with multiple anastomotic ulcers found on colonoscopy. She had been taking NSAIDs but also query component of IBD flare, especially given low inflixamab drug level Our office in in process of changing her Avesola schedule from Q 8 weeks to Q 4 weeks In the interim she is on a Prednisone taper, currently at 20 mg daily. She will have gotten another Avesola infusion before steroid taper is complete.   Repeat fecal calprotectin early November Follow up with Dr. Rush Landmark in December. Call in interim if needed   # IDA, secondary to above. Recently required several units of blood (after GI bleed).Followed by Hematology. Undergoing IV iron infusion.   HPI   [name] is a 41 y.o. female known to Dr.  Ardis Hughs with a past medical history significant for Crohn's ileocolitis, iron deficiency anemia, obesity. See PMH /PSH for additional history   Patient is a 41 year old female with longstanding Crohn's ileocolitis, status post ileocecectomy in 2011.  She was previously treated with Remicade, several months ago she was switched to [employer] ( biosimilar).  She thought she was doing well from a standpoint but then was hospitalized late August for melena / symptomatic anemia. White count was 20,000, hemoglobin 7.1 down from baseline of 12.  She had been on Naprosyn for ankle pain.  We saw her in consultation on 01/25/2022, please return to that note.  She underwent inpatient upper endoscopy and colonoscopy. Multiple anastomotic ulcers compatible with Crohn's recurrence were found on colonoscopy and felt to be the etiology of bleeding on naproxen.  Biopsies c/w ileocolonic inflammation and erosion.  Upper endoscopy was unremarkable except for small antral erosion. She was started on a prednisone  taper with plans to talk about transitioning to a different biologic at some point.   Since hospital discharge on 9/4 she has been back to ED several times for blood transfusions even though she hasn't had any further active bleeding. Between hospital admission and several follow up calls / lab there have been several providers involved in her care recently.  Dr. Rush Landmark has offered to be her primary GI until Dr. Ardis Hughs returns. She is also being followed by Hematology for IDA. She has received two doses of IV iron and is scheduled for three more.   Interval History:  Aiana hasn't had any further bleeding. Stools are formed. No abdominal pain. Currently on Prednisone 20 mg daily since Monday ( 3 days ago).  Labs on 9/20 show low Avsola drug level / antibody negative.  Fecal calprotectin 166.  Plan was to change Avsola from Q 8 weeks to Q 4 weeks, our office is currently working on that.   Her last infusion was 9/22.   Previous GI Evaluation   01/25/22 EGD  - Small antral erosion - Otherwise normal EGD. No endoscopic evidence of UGI bleeding STOMACH, BIOPSY:  - Antral and oxyntic mucosa with mild chronic inflammation.  - Immunohistochemistry for Helicobacter pylori is negative.   01/26/22 Colonoscopy  - Multiple anastomotic ulcers s/o Crohn's recurrence. Biopsied. The ulcers are likely the etiology of recent bleeding in setting of naproxen - Non-bleeding internal hemorrhoids. - The examination was otherwise normal on direct and retroflexion views. NEO ILEUM AND NEO CECUM, BIOPSY:  Ileocolonic type mucosa with acute inflammation and erosion  Negative for dysplasia and  granulomas    Labs:     Latest Ref Rng & Units 02/15/2022   11:10 AM 02/09/2022    3:03 PM 02/09/2022    9:22 AM  CBC  WBC 4.0 - 10.5 K/uL 23.0  29.0  27.8 Repeated and verified X2.   Hemoglobin 12.0 - 15.0 g/dL 8.9  7.4  6.9 Repeated and verified X2.   Hematocrit 36.0 - 46.0 % 29.2  24.9  22.3 Repeated and verified  X2.   Platelets 150 - 400 K/uL 401  555  516.0        Latest Ref Rng & Units 02/09/2022    3:03 PM 02/01/2022    8:25 PM 01/25/2022    3:28 AM  Hepatic Function  Total Protein 6.5 - 8.1 g/dL 6.8  6.7  5.2   Albumin 3.5 - 5.0 g/dL 3.3  2.7  2.3    2.3   AST 15 - 41 U/L _0 ALT 0 - 44 U/L _1 Alk Phosphatase 38 - 126 U/L 56  46  42   Total Bilirubin 0.3 - 1.2 mg/dL 0.3  0.2  0.4      Past Medical History:  Diagnosis Date   Abrasion of upper arm with infection, left, sequela    started antibiotics lef arm oct 4th started antibiotics healing well   Anemia    hx of 2 yrs ago   Chicken pox    Chronic rhinitis    Contact dermatitis and other eczema, due to unspecified cause    Crohn's 11-15-11   no issues in 2 yrs   Fibroids 11-15-11   remains with fibroids   Hernia    Incisional hernia, RLQ 10/02/2011   Infertility, female    Mononucleosis    Obesity    Pilonidal abscess    Rectal abscess    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[place])    Vitamin D deficiency     Past Surgical History:  Procedure Laterality Date   APPENDECTOMY  7/06   BIOPSY  01/25/2022   Procedure: BIOPSY;  Surgeon: Jackquline Denmark, MD;  Location: [facility];  Service: Gastroenterology;;   BIOPSY  01/26/2022   Procedure: BIOPSY;  Surgeon: Jackquline Denmark, MD;  Location: [facility];  Service: Gastroenterology;;   CESAREAN SECTION N/A 11/30/2020   Procedure: CESAREAN SECTION;  Surgeon: Everett Graff, MD;  Location: [facility];  Service: Obstetrics;  Laterality: N/A;   COLONOSCOPY WITH PROPOFOL N/A 03/07/2017   Procedure: COLONOSCOPY WITH PROPOFOL;  Surgeon: Milus Banister, MD;  Location: [facility];  Service: Endoscopy;  Laterality: N/A;   COLONOSCOPY WITH PROPOFOL N/A 01/26/2022   Procedure: COLONOSCOPY WITH PROPOFOL;  Surgeon: Jackquline Denmark, MD;  Location: [facility];  Service: Gastroenterology;  Laterality: N/A;   ESOPHAGOGASTRODUODENOSCOPY (EGD) WITH PROPOFOL N/A 01/25/2022   Procedure:  ESOPHAGOGASTRODUODENOSCOPY (EGD) WITH PROPOFOL;  Surgeon: Jackquline Denmark, MD;  Location: [facility];  Service: Gastroenterology;  Laterality: N/A;   EYE SURGERY  11-15-11   2003-multiple stys   ileocecal resection  1/11   crohns disease with fisula/stricture. Dr. Ronnald Collum   INCISIONAL HERNIA REPAIR N/A 12/24/2018   Procedure: OPEN REPAIR VENTRAL INCISIONAL HERNIA WITH MESH PATCH AND PRIMARY REPAIR OF LEFT SPIGELIAN HERNIA;  Surgeon: Armandina Gemma, MD;  Location: [facility];  Service: General;  Laterality: N/A;   KNEE ARTHROSCOPY  99, 02   Bil.   MYOMECTOMY N/A 11/10/2014   Procedure: MYOMECTOMY;  Surgeon: Everett Graff, MD;  Location: [facility];  Service: Gynecology;  Laterality: N/A;   PILONIDAL CYST EXCISION  2011, 2012   I&Ds   VENTRAL HERNIA REPAIR  11/27/2011   Procedure: LAPAROSCOPIC VENTRAL HERNIA;  Surgeon: Adin Hector, MD;  Location: [facility];  Service: General;  Laterality: N/A;  Laparoscopic Exploration & Repair of Hernia in Abdomen   WISDOM TOOTH EXTRACTION      Current Medications, Allergies, Family History and Social History were reviewed in [facility] record.     Current Outpatient Medications  Medication Sig Dispense Refill   Calcium Carb-Cholecalciferol (CALCIUM/VITAMIN D) 600-400 MG-UNIT TABS Take 1 tablet by mouth at bedtime.     Cholecalciferol (VITAMIN D3) 25 MCG (1000 UT) CAPS Take 1,000 Units by mouth daily with supper.     fexofenadine (ALLEGRA) 180 MG tablet Take 180 mg by mouth at bedtime.     Iron, Ferrous Sulfate, 325 (65 Fe) MG TABS Take 325 mg by mouth 2 (two) times daily with a meal.     metFORMIN (GLUCOPHAGE) 500 MG tablet Take 1 tablet (500 mg total) by mouth 2 (two) times daily with a meal. 60 tablet 0   pantoprazole (PROTONIX) 40 MG tablet Take 1 tablet (40 mg total) by mouth daily at 6 (six) AM. 30 tablet 0   [START ON 03/15/2022] predniSONE (DELTASONE) 10 MG tablet Take 1 tablet (10 mg total) by mouth daily with breakfast for 14  days. (Patient not taking: Reported on 02/09/2022) 14 tablet 0   predniSONE (DELTASONE) 20 MG tablet Take 1 tablet (20 mg total) by mouth daily with breakfast for 14 days. (Patient not taking: Reported on 02/09/2022) 14 tablet 0   Prenatal Vit-Fe Fumarate-FA (PRENATAL MULTIVITAMIN) TABS tablet Take 1 tablet by mouth at bedtime.     No current facility-administered medications for this visit.    Review of Systems: No chest pain. No shortness of breath. No urinary complaints.    Physical Exam  Wt Readings from Last 3 Encounters:  02/15/22 258 lb (117 kg)  02/09/22 260 lb (117.9 kg)  02/01/22 265 lb (120.2 kg)    LMP 01/20/2022 (Exact Date)  Constitutional:  Generally well appearing female in no acute distress. Psychiatric: Pleasant. Normal mood and affect. Behavior is normal. EENT: Pupils normal.  Conjunctivae are normal. No scleral icterus. Neck supple.  Cardiovascular: Normal rate, regular rhythm. No edema Pulmonary/chest: Effort normal and breath sounds normal. No wheezing, rales or rhonchi. Abdominal: Soft, nondistended, nontender. Bowel sounds active throughout. There are no masses palpable. No hepatomegaly. Neurological: Alert and oriented to person place and time. Skin: Skin is warm and dry. No rashes noted.  I spent 30 minutes total reviewing records, obtaining history, performing exam, counseling patient and documenting visit / findings.   Tye Savoy, NP  02/28/2022, 8:38 AM

## 2022-02-28 NOTE — Patient Instructions (Addendum)
Continue Prednisone tapers.  Your provider has requested that you go to the basement level for lab work on 03/28/22. Press "B" on the elevator. The lab is located at the first door on the left as you exit the elevator.   We have scheduled your follow up with Dr. Rush Landmark for 05/02/22 at 1:50 pm.  Due to recent changes in healthcare laws, you may see the results of your imaging and laboratory studies on MyChart before your provider has had a chance to review them.  We understand that in some cases there may be results that are confusing or concerning to you. Not all laboratory results come back in the same time frame and the provider may be waiting for multiple results in order to interpret others.  Please give [name] 48 hours in order for your provider to thoroughly review all the results before contacting the office for clarification of your results.    It was a pleasure to see you today!  Thank you for trusting me with your gastrointestinal care!

## 2022-02-28 NOTE — Telephone Encounter (Signed)
PA request has been submitted, and telephone encounter has been created

## 2022-02-28 NOTE — Telephone Encounter (Signed)
Patient Advocate Encounter  Received notification that prior authorization for INFLIXIMAB is required.   PA submitted on 10.4.23 Key [id number] Status is pending    Luciano Cutter, CPhT Patient Advocate Phone: [phone]

## 2022-03-01 ENCOUNTER — Inpatient Hospital Stay: Payer: BC Managed Care – PPO

## 2022-03-01 ENCOUNTER — Other Ambulatory Visit: Payer: Self-pay

## 2022-03-01 VITALS — BP 116/68 | HR 81 | Temp 98.0°F | Resp 19

## 2022-03-01 DIAGNOSIS — D509 Iron deficiency anemia, unspecified: Secondary | ICD-10-CM | POA: Diagnosis not present

## 2022-03-01 MED ORDER — SODIUM CHLORIDE 0.9 % IV SOLN
200.0000 mg | Freq: Once | INTRAVENOUS | Status: AC
  Administered 2022-03-01: 200 mg via INTRAVENOUS
  Filled 2022-03-01: qty 200

## 2022-03-01 MED ORDER — SODIUM CHLORIDE 0.9 % IV SOLN: Freq: Once | INTRAVENOUS | Status: AC

## 2022-03-01 NOTE — Patient Instructions (Signed)

## 2022-03-01 NOTE — Progress Notes (Signed)
Patient declined 30 minute post infusion observation

## 2022-03-01 NOTE — Progress Notes (Signed)
Attending Physician's Attestation   I have reviewed the chart.   I agree with the Advanced Practitioner's note, impression, and recommendations with any updates as below.    Bralyn Folkert Mansouraty, MD [facility] Office # [phone]  

## 2022-03-02 ENCOUNTER — Other Ambulatory Visit (HOSPITAL_COMMUNITY): Payer: Self-pay

## 2022-03-02 ENCOUNTER — Telehealth: Payer: Self-pay

## 2022-03-02 MED FILL — Iron Sucrose Inj 20 MG/ML (Fe Equiv): INTRAVENOUS | Qty: 10 | Status: AC

## 2022-03-02 NOTE — Telephone Encounter (Signed)
Dr Amil Amen of [facility] declines to infusion infliximab more frequently than every 8 weeks.

## 2022-03-03 ENCOUNTER — Inpatient Hospital Stay: Payer: BC Managed Care – PPO

## 2022-03-03 VITALS — BP 108/55 | HR 83 | Temp 98.2°F | Resp 18

## 2022-03-03 DIAGNOSIS — D509 Iron deficiency anemia, unspecified: Secondary | ICD-10-CM | POA: Diagnosis not present

## 2022-03-03 MED ORDER — SODIUM CHLORIDE 0.9 % IV SOLN
200.0000 mg | Freq: Once | INTRAVENOUS | Status: AC
  Administered 2022-03-03: 200 mg via INTRAVENOUS
  Filled 2022-03-03: qty 200

## 2022-03-03 MED ORDER — SODIUM CHLORIDE 0.9 % IV SOLN: Freq: Once | INTRAVENOUS | Status: AC

## 2022-03-03 NOTE — Patient Instructions (Signed)

## 2022-03-05 ENCOUNTER — Inpatient Hospital Stay: Payer: BC Managed Care – PPO

## 2022-03-05 VITALS — BP 106/60 | HR 88 | Temp 98.1°F | Resp 18

## 2022-03-05 DIAGNOSIS — D509 Iron deficiency anemia, unspecified: Secondary | ICD-10-CM | POA: Diagnosis not present

## 2022-03-05 MED ORDER — SODIUM CHLORIDE 0.9 % IV SOLN
200.0000 mg | Freq: Once | INTRAVENOUS | Status: AC
  Administered 2022-03-05: 200 mg via INTRAVENOUS
  Filled 2022-03-05: qty 200

## 2022-03-05 MED ORDER — SODIUM CHLORIDE 0.9 % IV SOLN: Freq: Once | INTRAVENOUS | Status: AC

## 2022-03-05 NOTE — Telephone Encounter (Signed)
Message Received: Today Mansouraty, Telford Nab., MD  Willia Craze, NP; Christy Sartorius, Real Cons, LPN; Hennie Duos, MD Caller: Unspecified (3 days ago, 11:47 AM) Not sure I understand the reasoning to decline the Avsola infusions.  Would like to touchbase.  Not sure, if there is a phone number.   Dr. Amil Amen,  Good afternoon.  I wanted to touchbase in regards to concerns and reasoning, why we cannot proceed with our planning for increased dosing frequency for Avsola.  Her therapeutic drug monitoring levels were low and she hasn't developed antibodies as of yet.  We should be able to continue with increased dosing, in an effort of trying to get her levels up and see if we can salvage this current biologic.  Happy to discuss over the phone as well ([phone]).  Thanks.  GM    I have called Cecille Rubin at [facility] [phone]. Left her a message asking if she could help to get this message to Dr Amil Amen.

## 2022-03-05 NOTE — Patient Instructions (Signed)

## 2022-03-05 NOTE — Progress Notes (Signed)
Patient declined to stay for 30 minute post observation. Patient ambulatory and VSS at discharge.

## 2022-03-06 ENCOUNTER — Telehealth: Payer: Self-pay | Admitting: Pharmacy Technician

## 2022-03-06 ENCOUNTER — Other Ambulatory Visit: Payer: Self-pay

## 2022-03-06 NOTE — Telephone Encounter (Signed)
Wynetta Fines, CPhT  Timothy Lasso, RN; Tresa Res, [facility] Chong Sicilian,  We have received the orders and Josem Kaufmann has been submitted and currently pending approval        Previous Messages    ----- Message -----  From: Timothy Lasso, RN  Sent: 03/06/2022   2:28 PM EDT  To: Wynetta Fines, CPhT   Good afternoon I have a patient that has been getting Avsola every 8 weeks at [facility] but she needs to be increased to every 4 weeks and Dr Amil Amen will not be able to accommodate.  I have entered the orders to be done at [facility].  We already have the PA completed and approved.  (See notes in Epic) She needs to have this done by 10/20.  Can you help with this?  Thank you so much.

## 2022-03-06 NOTE — Telephone Encounter (Addendum)
Auth Submission: APPROVED Payer: [employer] Medication & CPT/J Code(s) submitted: Avsola (infliximab-axxq) [phone] Route of submission (phone, fax, portal): COVER MY MEDS Phone # Fax # Auth type: Buy/Bill Units/visits requested: 5MG/KG Q4WKS (600MG) Reference number: [id number] Approval from:  03/06/22 to  03/05/23 at [place] co-pay card:  Card id: [id number] Cvv: [id number] Exp: 9/27  Id: [id number] Pcn: [id number] Gr: [id number] Novelty: [id number]

## 2022-03-06 NOTE — Telephone Encounter (Signed)
I spoke with Dr. Amil Amen about the overall case and scenario. His facility has had issues with insurance for increased Remicade/Avsola infusions in the past. Even when prior authorizations have been obtained with insurance, in regards to increased frequency of dosing, payment issues have occurred where his office had nonpayment even with prior authorization. This makes it difficult for him and his practice overall. He agrees that if it is felt from a medical standpoint that she would benefit from increased dosing frequency that he will allow [name] as her GI team to dose infusions as we see fit, but it cannot be at his center. As such, the patient will need to be set up at a new site for infusions unfortunately. Until the patient can be set up at one of the new centers, then she will stay at q. 8-week dosing. Please reach out to her and let her know what we need to do in regards to getting her set up for increased frequency of dosing. Once she agrees with this plan of action let me know which facility and we can get the orders set up. Thanks. GM

## 2022-03-06 NOTE — Telephone Encounter (Signed)
Orders for Avsola every 4 weeks sent to [facility] - Note has also been sent to [facility] to inform her of the situation.

## 2022-03-06 NOTE — Telephone Encounter (Signed)
Inbound call from patient requesting a call back to discuss Avsola treatment. Patient stated that she is wanting to discuss where she is needed to have treatment done, and if it is not approved before the 20th of this month what she will need to do. Please advise.

## 2022-03-06 NOTE — Telephone Encounter (Signed)
Dr Rush Landmark have you discussed with Dr Amil Amen the [place]?  She was approved per the PA team. See notes

## 2022-03-06 NOTE — Telephone Encounter (Signed)
The pt has been advised that the [facility] infusion center will be calling her to set up an appt.  She will call if she has any questions or concerns

## 2022-03-06 NOTE — Telephone Encounter (Signed)
Patient Advocate Encounter  Prior Authorization for inFLIXimab 100MG solution has been approved.     Effective: 03-01-2022 to 10-05-024

## 2022-03-06 NOTE — Telephone Encounter (Signed)
Patient Advocate Encounter   Prior Authorization for inFLIXimab 100MG solution has been approved.       Effective: 03-01-2022 to 10-05-024

## 2022-03-07 ENCOUNTER — Encounter: Payer: Self-pay | Admitting: Hematology and Oncology

## 2022-03-07 ENCOUNTER — Other Ambulatory Visit: Payer: Self-pay | Admitting: Pharmacy Technician

## 2022-03-07 ENCOUNTER — Encounter: Payer: Self-pay | Admitting: Gastroenterology

## 2022-03-09 ENCOUNTER — Encounter: Payer: Self-pay | Admitting: Gastroenterology

## 2022-03-16 ENCOUNTER — Ambulatory Visit (INDEPENDENT_AMBULATORY_CARE_PROVIDER_SITE_OTHER): Payer: BC Managed Care – PPO

## 2022-03-16 VITALS — BP 111/71 | HR 81 | Temp 98.3°F | Resp 18 | Ht 63.0 in | Wt 268.4 lb

## 2022-03-16 DIAGNOSIS — Z8719 Personal history of other diseases of the digestive system: Secondary | ICD-10-CM | POA: Diagnosis not present

## 2022-03-16 DIAGNOSIS — K5000 Crohn's disease of small intestine without complications: Secondary | ICD-10-CM | POA: Diagnosis not present

## 2022-03-16 MED ORDER — METHYLPREDNISOLONE SODIUM SUCC 40 MG IJ SOLR: 40.0000 mg | Freq: Once | INTRAMUSCULAR | Status: DC

## 2022-03-16 MED ORDER — ACETAMINOPHEN 325 MG PO TABS: 650.0000 mg | ORAL_TABLET | Freq: Once | ORAL | Status: DC

## 2022-03-16 MED ORDER — DIPHENHYDRAMINE HCL 25 MG PO CAPS: 25.0000 mg | ORAL_CAPSULE | Freq: Once | ORAL | Status: DC

## 2022-03-16 MED ORDER — SODIUM CHLORIDE 0.9 % IV SOLN
5.0000 mg/kg | INTRAVENOUS | Status: DC
  Administered 2022-03-16: 600 mg via INTRAVENOUS
  Filled 2022-03-16: qty 60

## 2022-03-16 NOTE — Progress Notes (Signed)
Diagnosis: Crohn's Disease  Provider:  Marshell Garfinkel MD  Procedure: Infusion  IV Type: Peripheral, IV Location: L Antecubital  Avsola (infliximab-axxq), Dose: 600 mg  Infusion Start Time: 6893  Infusion Stop Time: 1210  Post Infusion IV Care: Patient declined observation and Peripheral IV Discontinued  Discharge: Condition: Good, Destination: Home . AVS provided to patient.   Performed by:  Cleophus Molt, RN

## 2022-03-30 ENCOUNTER — Other Ambulatory Visit: Payer: BC Managed Care – PPO

## 2022-03-30 DIAGNOSIS — R197 Diarrhea, unspecified: Secondary | ICD-10-CM

## 2022-03-30 DIAGNOSIS — K5090 Crohn's disease, unspecified, without complications: Secondary | ICD-10-CM

## 2022-03-30 DIAGNOSIS — D649 Anemia, unspecified: Secondary | ICD-10-CM

## 2022-04-06 LAB — CALPROTECTIN, FECAL: Calprotectin, Fecal: 23 ug/g (ref 0–120)

## 2022-04-09 ENCOUNTER — Encounter: Payer: Self-pay | Admitting: Gastroenterology

## 2022-04-12 ENCOUNTER — Inpatient Hospital Stay: Payer: BC Managed Care – PPO

## 2022-04-12 ENCOUNTER — Inpatient Hospital Stay: Payer: BC Managed Care – PPO | Attending: Hematology and Oncology | Admitting: Hematology and Oncology

## 2022-04-12 ENCOUNTER — Ambulatory Visit: Payer: BC Managed Care – PPO | Admitting: Hematology and Oncology

## 2022-04-12 VITALS — BP 140/76 | HR 88 | Temp 97.9°F | Resp 16 | Wt 272.4 lb

## 2022-04-12 DIAGNOSIS — Z79899 Other long term (current) drug therapy: Secondary | ICD-10-CM | POA: Insufficient documentation

## 2022-04-12 DIAGNOSIS — D509 Iron deficiency anemia, unspecified: Secondary | ICD-10-CM | POA: Diagnosis not present

## 2022-04-12 DIAGNOSIS — D75839 Thrombocytosis, unspecified: Secondary | ICD-10-CM | POA: Insufficient documentation

## 2022-04-12 DIAGNOSIS — K5090 Crohn's disease, unspecified, without complications: Secondary | ICD-10-CM | POA: Diagnosis not present

## 2022-04-12 DIAGNOSIS — D649 Anemia, unspecified: Secondary | ICD-10-CM | POA: Insufficient documentation

## 2022-04-12 LAB — CBC WITH DIFFERENTIAL/PLATELET
Abs Immature Granulocytes: 0.03 10*3/uL (ref 0.00–0.07)
Basophils Absolute: 0 10*3/uL (ref 0.0–0.1)
Basophils Relative: 0 %
Eosinophils Absolute: 0.1 10*3/uL (ref 0.0–0.5)
Eosinophils Relative: 1 %
HCT: 40.5 % (ref 36.0–46.0)
Hemoglobin: 12.7 g/dL (ref 12.0–15.0)
Immature Granulocytes: 0 %
Lymphocytes Relative: 20 %
Lymphs Abs: 2.3 10*3/uL (ref 0.7–4.0)
MCH: 27.9 pg (ref 26.0–34.0)
MCHC: 31.4 g/dL (ref 30.0–36.0)
MCV: 88.8 fL (ref 80.0–100.0)
Monocytes Absolute: 1.4 10*3/uL — ABNORMAL HIGH (ref 0.1–1.0)
Monocytes Relative: 12 %
Neutro Abs: 7.4 10*3/uL (ref 1.7–7.7)
Neutrophils Relative %: 67 %
Platelets: 297 10*3/uL (ref 150–400)
RBC: 4.56 MIL/uL (ref 3.87–5.11)
RDW: 17.2 % — ABNORMAL HIGH (ref 11.5–15.5)
WBC: 11.3 10*3/uL — ABNORMAL HIGH (ref 4.0–10.5)
nRBC: 0 % (ref 0.0–0.2)

## 2022-04-12 LAB — IRON AND IRON BINDING CAPACITY (CC-WL,HP ONLY)
Iron: 24 ug/dL — ABNORMAL LOW (ref 28–170)
Saturation Ratios: 8 % — ABNORMAL LOW (ref 10.4–31.8)
TIBC: 318 ug/dL (ref 250–450)
UIBC: 294 ug/dL (ref 148–442)

## 2022-04-12 LAB — FERRITIN: Ferritin: 27 ng/mL (ref 11–307)

## 2022-04-12 NOTE — Progress Notes (Signed)
[name], [name], [name], [name], [name], [name], [name], [name], [name]  CHIEF COMPLAINTS/PURPOSE OF CONSULTATION:  Anemia  ASSESSMENT & PLAN:   This is a very pleasant 41 year old female patient with Crohn's disease, severe anemia with multiple ER visits and hospitalizations recently, currently on prednisone 30 mg, previously on infliximab for the past several years with great response referred to hematology for recommendations regarding severe anemia.  She received IV iron, tolerated it very well, feels remarkably improved.  Fatigue is better, shortness of breath is better, pica has resolved.  She denies any more episodes of bleeding.  She has not been taking any more naproxen.  No concerns on physical examination today.  We will plan to repeat labs today and she will return to clinic in 6 months or sooner as needed.   HISTORY OF PRESENTING ILLNESS:  [name] 41 y.o. female is here because of anemia and thrombocytosis.  This is a 40 year old with Crohn's disease status post colonic resection and currently on infliximab who was recently seen in the emergency room with syncopal episode was found to have Hemoccult positive and hemoglobin of 6.9, significant drop from hemoglobin of 12.1.  She was admitted to the hospital with GI bleeding and underwent upper GI endoscopy that was essentially negative and showed a small antral erosion.  She also underwent colonoscopy which showed multiple anastomotic ulcers with signs of Crohn's recurrence.  She was then given packed red blood cells and was discharged to also follow-up with hematology.  She is here for a  follow up. She denies any new health complaints.  She feels much better after the IV iron.  She feels much more energetic.  Shortness of breath has significantly improved, pica has resolved.  She denies any blood per stool or melena.  She is now back on infliximab for Crohn's disease.  She is done with prednisone.  No change in bowel habits or urinary habits. Rest of the pertinent 10 point ROS reviewed and negative.  REVIEW OF SYSTEMS:   Constitutional: Denies fevers, chills or abnormal night sweats Eyes: Denies blurriness of vision, double vision or watery eyes Ears, nose, mouth, throat, and face: Denies mucositis or sore throat Respiratory: Denies cough, dyspnea or wheezes Cardiovascular: Denies palpitation, chest discomfort or lower extremity swelling Gastrointestinal:  Denies nausea, heartburn or change in bowel habits Skin: Denies abnormal skin rashes Lymphatics: Denies new lymphadenopathy or easy bruising Neurological:Denies numbness, tingling or new weaknesses Behavioral/Psych: Mood is stable, no new changes  All other systems were reviewed with the patient and are negative.  MEDICAL HISTORY:  Past Medical History:  Diagnosis Date   Abrasion of upper arm with infection, left, sequela    started antibiotics lef arm oct 4th started antibiotics healing well   Anemia    hx of 2 yrs ago   Chicken pox    Chronic rhinitis    Contact dermatitis and other eczema, due to unspecified cause    Crohn's 11-15-11   no issues in 2 yrs   Fibroids 11-15-11   remains with fibroids   Hernia    Incisional hernia, RLQ 10/02/2011   Infertility, female    Mononucleosis    Obesity    Pilonidal abscess  Rectal abscess    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[place])    Vitamin D deficiency     SURGICAL HISTORY: Past Surgical History:  Procedure Laterality Date   APPENDECTOMY  7/06   BIOPSY  01/25/2022   Procedure: BIOPSY;  Surgeon: Jackquline Denmark, MD;  Location: [facility];  Service:  Gastroenterology;;   BIOPSY  01/26/2022   Procedure: BIOPSY;  Surgeon: Jackquline Denmark, MD;  Location: [facility];  Service: Gastroenterology;;   CESAREAN SECTION N/A 11/30/2020   Procedure: CESAREAN SECTION;  Surgeon: Everett Graff, MD;  Location: [facility];  Service: Obstetrics;  Laterality: N/A;   COLONOSCOPY WITH PROPOFOL N/A 03/07/2017   Procedure: COLONOSCOPY WITH PROPOFOL;  Surgeon: Milus Banister, MD;  Location: [facility];  Service: Endoscopy;  Laterality: N/A;   COLONOSCOPY WITH PROPOFOL N/A 01/26/2022   Procedure: COLONOSCOPY WITH PROPOFOL;  Surgeon: Jackquline Denmark, MD;  Location: [facility];  Service: Gastroenterology;  Laterality: N/A;   ESOPHAGOGASTRODUODENOSCOPY (EGD) WITH PROPOFOL N/A 01/25/2022   Procedure: ESOPHAGOGASTRODUODENOSCOPY (EGD) WITH PROPOFOL;  Surgeon: Jackquline Denmark, MD;  Location: [facility];  Service: Gastroenterology;  Laterality: N/A;   EYE SURGERY  11-15-11   2003-multiple stys   ileocecal resection  1/11   crohns disease with fisula/stricture. Dr. Ronnald Collum   INCISIONAL HERNIA REPAIR N/A 12/24/2018   Procedure: OPEN REPAIR VENTRAL INCISIONAL HERNIA WITH MESH PATCH AND PRIMARY REPAIR OF LEFT SPIGELIAN HERNIA;  Surgeon: Armandina Gemma, MD;  Location: [facility];  Service: General;  Laterality: N/A;   KNEE ARTHROSCOPY  99, 02   Bil.   MYOMECTOMY N/A 11/10/2014   Procedure: MYOMECTOMY;  Surgeon: Everett Graff, MD;  Location: [facility];  Service: Gynecology;  Laterality: N/A;   PILONIDAL CYST EXCISION  2011, 2012   I&Ds   VENTRAL HERNIA REPAIR  11/27/2011   Procedure: LAPAROSCOPIC VENTRAL HERNIA;  Surgeon: Adin Hector, MD;  Location: [facility];  Service: General;  Laterality: N/A;  Laparoscopic Exploration & Repair of Hernia in Abdomen   WISDOM TOOTH EXTRACTION      SOCIAL HISTORY: Social History   Socioeconomic History   Marital status: Married    Spouse name: [name]   Number of children: Not on file   Years of education: Not on file   Highest education  level: Bachelor's degree (e.g., BA, AB, BS)  Occupational History   Occupation: Music therapist  Tobacco Use   Smoking status: Never   Smokeless tobacco: Never  Vaping Use   Vaping Use: Never used  Substance and Sexual Activity   Alcohol use: No   Drug use: No   Sexual activity: Yes    Partners: Male  Other Topics Concern   Not on file  Social History Narrative   married   [place] of 2   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    No pets   Sleep 6-7 hours    BF with 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   Walking exercise   Eats balanced generally   Social Determinants of Health   Financial Resource Strain: Low Risk  (04/02/2021)   Overall Financial Resource Strain (CARDIA)    Difficulty of Paying Living Expenses: Not hard at all  Food Insecurity: No Food Insecurity (04/02/2021)   Hunger Vital Sign    Worried About Running Out of Food in the Last Year: Never true    Ran Out of Food in the Last Year: Never true  Transportation Needs: No Transportation Needs (04/02/2021)   [place] - Transportation  Lack of Transportation (Medical): No    Lack of Transportation (Non-Medical): No  Physical Activity: Unknown (04/02/2021)   Exercise Vital Sign    Days of Exercise per Week: 0 days    Minutes of Exercise per Session: Not on file  Stress: No Stress Concern Present (04/02/2021)   [place]    Feeling of Stress : Not at all  Social Connections: [place] (04/02/2021)   Social Connection and Isolation Panel [NHANES]    Frequency of Communication with Friends and Family: More than three times a week    Frequency of Social Gatherings with Friends and Family: Patient refused    Attends Religious Services: More than 4 times per year    Active Member of [employer] or Organizations: Yes    Attends Archivist Meetings: 1 to 4 times per year    Marital Status: Married  Human resources officer Violence: Not on file     FAMILY HISTORY: Family History  Problem Relation Age of Onset   Aneurysm Mother        brain   Heart disease Mother 75       heart anyrism   Healthy Father        fairly   Diabetes Sister        half sister   Bipolar disorder Sister        half sister   Colon cancer Maternal Grandmother    Stomach cancer Paternal Grandfather    Multiple sclerosis Sister     ALLERGIES:  has No Known Allergies.  MEDICATIONS:  Current Outpatient Medications  Medication Sig Dispense Refill   Calcium Carb-Cholecalciferol (CALCIUM/VITAMIN D) 600-400 MG-UNIT TABS Take 1 tablet by mouth at bedtime.     Cholecalciferol (VITAMIN D3) 25 MCG (1000 UT) CAPS Take 1,000 Units by mouth daily with supper.     fexofenadine (ALLEGRA) 180 MG tablet Take 180 mg by mouth at bedtime.     Iron, Ferrous Sulfate, 325 (65 Fe) MG TABS Take 325 mg by mouth 2 (two) times daily with a meal. (Patient not taking: Reported on 02/28/2022)     metFORMIN (GLUCOPHAGE) 500 MG tablet Take 1 tablet (500 mg total) by mouth 2 (two) times daily with a meal. 60 tablet 0   pantoprazole (PROTONIX) 40 MG tablet Take 1 tablet (40 mg total) by mouth daily at 6 (six) AM. (Patient not taking: Reported on 02/28/2022) 30 tablet 0   Prenatal Vit-Fe Fumarate-FA (PRENATAL MULTIVITAMIN) TABS tablet Take 1 tablet by mouth at bedtime.     No current facility-administered medications for this visit.     PHYSICAL EXAMINATION: ECOG PERFORMANCE STATUS: 0 - Asymptomatic  Vitals:   04/12/22 0845  BP: (!) 140/76  Pulse: 88  Resp: 16  Temp: 97.9 F (36.6 C)  SpO2: 99%   Filed Weights   04/12/22 0845  Weight: 272 lb 6 oz (123.5 kg)    General appearance: Alert, oriented and in no acute distress, wearing a mask Head HEENT: Has some nasal congestion and nasal twang. Chest: Clear to auscultation bilaterally Abdomen: Soft, nontender, nondistended, no evidence of hepatosplenomegaly Extremities: No lower extremity edema   LABORATORY DATA:  I  have reviewed the data as listed Lab Results  Component Value Date   WBC 23.0 (H) 02/15/2022   HGB 8.9 (L) 02/15/2022   HCT 29.2 (L) 02/15/2022   MCV 86.9 02/15/2022   PLT 401 (H) 02/15/2022     Chemistry  Component Value Date/Time   NA 138 02/09/2022 1503   NA 140 12/26/2021 0946   K 4.6 02/09/2022 1503   CL 104 02/09/2022 1503   CO2 27 02/09/2022 1503   BUN 15 02/09/2022 1503   BUN 16 12/26/2021 0946   CREATININE 0.71 02/09/2022 1503      Component Value Date/Time   CALCIUM 8.9 02/09/2022 1503   CALCIUM 7.6 (L) 01/25/2022 0328   ALKPHOS 56 02/09/2022 1503   AST 14 (L) 02/09/2022 1503   ALT 16 02/09/2022 1503   BILITOT 0.3 02/09/2022 1503   BILITOT 0.3 12/26/2021 0946       RADIOGRAPHIC STUDIES: I have personally reviewed the radiological images as listed and agreed with the findings in the report. No results found.  All questions were answered. The patient knows to call the clinic with any problems, questions or concerns. I spent 20 minutes in the care of this patient including H and P, review of records, counseling and coordination of care.     Benay Pike, MD 04/12/2022 9:01 AM

## 2022-04-13 ENCOUNTER — Ambulatory Visit (INDEPENDENT_AMBULATORY_CARE_PROVIDER_SITE_OTHER): Payer: BC Managed Care – PPO

## 2022-04-13 ENCOUNTER — Encounter: Payer: Self-pay | Admitting: *Deleted

## 2022-04-13 ENCOUNTER — Telehealth: Payer: Self-pay | Admitting: *Deleted

## 2022-04-13 VITALS — BP 104/67 | HR 73 | Resp 20 | Ht 63.0 in | Wt 273.2 lb

## 2022-04-13 DIAGNOSIS — Z8719 Personal history of other diseases of the digestive system: Secondary | ICD-10-CM

## 2022-04-13 DIAGNOSIS — K5000 Crohn's disease of small intestine without complications: Secondary | ICD-10-CM

## 2022-04-13 MED ORDER — ACETAMINOPHEN 325 MG PO TABS: 650.0000 mg | ORAL_TABLET | Freq: Once | ORAL | Status: DC

## 2022-04-13 MED ORDER — METHYLPREDNISOLONE SODIUM SUCC 40 MG IJ SOLR: 40.0000 mg | Freq: Once | INTRAMUSCULAR | Status: DC

## 2022-04-13 MED ORDER — SODIUM CHLORIDE 0.9 % IV SOLN
5.0000 mg/kg | INTRAVENOUS | Status: DC
  Administered 2022-04-13: 600 mg via INTRAVENOUS
  Filled 2022-04-13: qty 60

## 2022-04-13 MED ORDER — DIPHENHYDRAMINE HCL 25 MG PO CAPS: 25.0000 mg | ORAL_CAPSULE | Freq: Once | ORAL | Status: DC

## 2022-04-13 NOTE — Telephone Encounter (Addendum)
-----   Message from Benay Pike, MD sent at 04/12/2022 10:05 AM EST ----- Hb significantly improved. No need for iron supplementation at this time. RTC in 6 months as suggested before.  Called and obtained identified VM message left per above as well as noted pt is active on My Chart and will forward MD message to pt.

## 2022-04-13 NOTE — Progress Notes (Signed)
Diagnosis: Crohn's Disease  Provider:  Marshell Garfinkel MD  Procedure: Infusion  IV Type: Peripheral, IV Location: L Antecubital  Avsola (infliximab-axxq), Dose: 600 mg  Infusion Start Time: 9480  Infusion Stop Time: 1655  Post Infusion IV Care: Peripheral IV Discontinued  Discharge: Condition: Good, Destination: Home . AVS provided to patient.   Performed by:  Koren Shiver, RN

## 2022-05-02 ENCOUNTER — Encounter: Payer: Self-pay | Admitting: Gastroenterology

## 2022-05-02 ENCOUNTER — Ambulatory Visit (INDEPENDENT_AMBULATORY_CARE_PROVIDER_SITE_OTHER): Payer: BC Managed Care – PPO | Admitting: Gastroenterology

## 2022-05-02 VITALS — BP 112/64 | HR 96 | Ht 62.0 in | Wt 277.4 lb

## 2022-05-02 DIAGNOSIS — D500 Iron deficiency anemia secondary to blood loss (chronic): Secondary | ICD-10-CM | POA: Diagnosis not present

## 2022-05-02 DIAGNOSIS — D84821 Immunodeficiency due to drugs: Secondary | ICD-10-CM

## 2022-05-02 DIAGNOSIS — R933 Abnormal findings on diagnostic imaging of other parts of digestive tract: Secondary | ICD-10-CM

## 2022-05-02 DIAGNOSIS — Z79899 Other long term (current) drug therapy: Secondary | ICD-10-CM

## 2022-05-02 DIAGNOSIS — K5090 Crohn's disease, unspecified, without complications: Secondary | ICD-10-CM

## 2022-05-02 DIAGNOSIS — K50918 Crohn's disease, unspecified, with other complication: Secondary | ICD-10-CM

## 2022-05-02 NOTE — Patient Instructions (Addendum)
You will needs labs done on 05/11/22 prior to your Infliximab Infusion. Lab order has been placed in epic.   You will also need repeat labs in Jan 2024 prior to your Infliximab infusion, that will include Infliximab Trough and Drug Level. Order will be placed in epic and expected date will be Jan 2024. Please have [facility] to contact office, if they have any questions. They can reach [place] or [place] at [phone].   Due to recent changes in healthcare laws, you may see the results of your imaging and laboratory studies on MyChart before your provider has had a chance to review them.  We understand that in some cases there may be results that are confusing or concerning to you. Not all laboratory results come back in the same time frame and the provider may be waiting for multiple results in order to interpret others.  Please give [name] 48 hours in order for your provider to thoroughly review all the results before contacting the office for clarification of your results.   Thank you for choosing me and [facility].  Dr. Rush Landmark

## 2022-05-06 ENCOUNTER — Encounter: Payer: Self-pay | Admitting: Gastroenterology

## 2022-05-06 DIAGNOSIS — K5090 Crohn's disease, unspecified, without complications: Secondary | ICD-10-CM | POA: Insufficient documentation

## 2022-05-06 DIAGNOSIS — D84821 Immunodeficiency due to drugs: Secondary | ICD-10-CM | POA: Insufficient documentation

## 2022-05-06 DIAGNOSIS — R933 Abnormal findings on diagnostic imaging of other parts of digestive tract: Secondary | ICD-10-CM | POA: Insufficient documentation

## 2022-05-06 NOTE — Progress Notes (Signed)
[place] VISIT   Primary Care Provider Panosh, [name], MD [place]  [id number] [phone]   Patient Profile: [name] is a 41 y.o. female with a pmh significant for Crohn's ileocolitis disease (status post ileocecectomy) and now on every 4 week Infliximab/Avsola, status post appendectomy, obesity, allergies, iron deficiency.  The patient presents to the [facility] for an evaluation and management of problem(s) noted below:  Problem List 1. Crohn's disease of intestine with other complication ([place])   2. Iron deficiency anemia due to chronic blood loss   3. Abnormal colonoscopy   4. Immunosuppression due to drug therapy [facility])     History of Present Illness Please see prior notes for full details of HPI.  Interval History Since the patient's last clinic visit, the patient has been doing quite well.  Overall she is feeling better.  She is tolerating every 4 week Avsola infusions.  She is wondering what the long-term plan will be.  She is not having any new or progressive GI symptoms otherwise.  She is not taking any nonsteroidals.  BMs per day -1-3 bowel movements Nocturnal BMs -none Blood -none Mucous -none Tenesmus -none Urgency -none Skin Manifestations -none Eye Manifestations -none Joint Manifestations -none  GI Review of Systems Positive as above Negative for pyrosis, dysphagia, odynophagia, nausea, vomiting, pain, melena, hematochezia  Review of Systems General: Denies fevers/chills/weight loss unintentionally Cardiovascular: Denies chest pain Pulmonary: Denies shortness of breath Gastroenterological: See HPI Genitourinary: Denies darkened urine Hematological: Denies easy bruising/bleeding Dermatological: Denies jaundice Psychological: Mood is stable   Medications Current Outpatient Medications  Medication Sig Dispense Refill   Calcium Carb-Cholecalciferol (CALCIUM/VITAMIN D)  600-400 MG-UNIT TABS Take 1 tablet by mouth at bedtime.     Cholecalciferol (VITAMIN D3) 25 MCG (1000 UT) CAPS Take 1,000 Units by mouth daily with supper.     fexofenadine (ALLEGRA) 180 MG tablet Take 180 mg by mouth at bedtime.     inFLIXimab-axxq (AVSOLA) 100 MG injection Inject 1 mg into the vein once a week.     metFORMIN (GLUCOPHAGE) 500 MG tablet Take 1 tablet (500 mg total) by mouth 2 (two) times daily with a meal. 60 tablet 0   Prenatal Vit-Fe Fumarate-FA (PRENATAL MULTIVITAMIN) TABS tablet Take 1 tablet by mouth at bedtime.     No current facility-administered medications for this visit.    Allergies No Known Allergies  Histories Past Medical History:  Diagnosis Date   Abrasion of upper arm with infection, left, sequela    started antibiotics lef arm oct 4th started antibiotics healing well   Anemia    hx of 2 yrs ago   Chicken pox    Chronic rhinitis    Contact dermatitis and other eczema, due to unspecified cause    Crohn's 11-15-11   no issues in 2 yrs   Fibroids 11-15-11   remains with fibroids   Hernia    Incisional hernia, RLQ 10/02/2011   Infertility, female    Mononucleosis    Obesity    Pilonidal abscess    Rectal abscess    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[place])    Vitamin D deficiency    Past Surgical History:  Procedure Laterality Date   APPENDECTOMY  7/06   BIOPSY  01/25/2022   Procedure: BIOPSY;  Surgeon: Jackquline Denmark, MD;  Location: [facility];  Service: Gastroenterology;;   BIOPSY  01/26/2022   Procedure: BIOPSY;  Surgeon: Jackquline Denmark, MD;  Location: [facility];  Service: Gastroenterology;;   CESAREAN SECTION N/A 11/30/2020   Procedure: CESAREAN SECTION;  Surgeon: Everett Graff, MD;  Location: [facility] LD [facility]S;  Service: Obstetrics;  Laterality: N/A;   COLONOSCOPY WITH PROPOFOL N/A 03/07/2017   Procedure: COLONOSCOPY WITH PROPOFOL;  Surgeon: Milus Banister, MD;  Location: [facility];  Service: Endoscopy;  Laterality: N/A;   COLONOSCOPY WITH  PROPOFOL N/A 01/26/2022   Procedure: COLONOSCOPY WITH PROPOFOL;  Surgeon: Jackquline Denmark, MD;  Location: [facility];  Service: Gastroenterology;  Laterality: N/A;   ESOPHAGOGASTRODUODENOSCOPY (EGD) WITH PROPOFOL N/A 01/25/2022   Procedure: ESOPHAGOGASTRODUODENOSCOPY (EGD) WITH PROPOFOL;  Surgeon: Jackquline Denmark, MD;  Location: [facility];  Service: Gastroenterology;  Laterality: N/A;   EYE SURGERY  11-15-11   2003-multiple stys   ileocecal resection  1/11   crohns disease with fisula/stricture. Dr. Ronnald Collum   INCISIONAL HERNIA REPAIR N/A 12/24/2018   Procedure: OPEN REPAIR VENTRAL INCISIONAL HERNIA WITH MESH PATCH AND PRIMARY REPAIR OF LEFT SPIGELIAN HERNIA;  Surgeon: Armandina Gemma, MD;  Location: [facility];  Service: General;  Laterality: N/A;   KNEE ARTHROSCOPY  99, 02   Bil.   MYOMECTOMY N/A 11/10/2014   Procedure: MYOMECTOMY;  Surgeon: Everett Graff, MD;  Location: [facility];  Service: Gynecology;  Laterality: N/A;   PILONIDAL CYST EXCISION  2011, 2012   I&Ds   VENTRAL HERNIA REPAIR  11/27/2011   Procedure: LAPAROSCOPIC VENTRAL HERNIA;  Surgeon: Adin Hector, MD;  Location: [facility];  Service: General;  Laterality: N/A;  Laparoscopic Exploration & Repair of Hernia in Abdomen   WISDOM TOOTH EXTRACTION     Social History   Socioeconomic History   Marital status: Married    Spouse name: Ausha Sieh   Number of children: Not on file   Years of education: Not on file   Highest education level: Bachelor's degree (e.g., BA, AB, BS)  Occupational History   Occupation: Music therapist  Tobacco Use   Smoking status: Never   Smokeless tobacco: Never  Vaping Use   Vaping Use: Never used  Substance and Sexual Activity   Alcohol use: No   Drug use: No   Sexual activity: Yes    Partners: Male  Other Topics Concern   Not on file  Social History Narrative   married   [place] of 2   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    No pets   Sleep 6-7 hours    BF with lymphoma  on chemo   Walking exercise   Eats balanced generally   Social Determinants of Health   Financial Resource Strain: Low Risk  (04/02/2021)   Overall Financial Resource Strain (CARDIA)    Difficulty of Paying Living Expenses: Not hard at all  Food Insecurity: No Food Insecurity (04/02/2021)   Hunger Vital Sign    Worried About Running Out of Food in the Last Year: Never true    [place] in the Last Year: Never true  Transportation Needs: No Transportation Needs (04/02/2021)   PRAPARE - Hydrologist (Medical): No    Lack of Transportation (Non-Medical): No  Physical Activity: Unknown (04/02/2021)   Exercise Vital Sign    Days of Exercise per Week: 0 days    Minutes of Exercise per Session: Not on file  Stress: No Stress Concern Present (04/02/2021)   [place]    Feeling of Stress : Not at all  Social Connections:  Socially Integrated (04/02/2021)   Social Connection and Isolation Panel [NHANES]    Frequency of Communication with Friends and Family: More than three times a week    Frequency of Social Gatherings with Friends and Family: Patient refused    Attends Religious Services: More than 4 times per year    Active Member of [employer] or Organizations: Yes    Attends Archivist Meetings: 1 to 4 times per year    Marital Status: Married  Human resources officer Violence: Not on file   Family History  Problem Relation Age of Onset   Aneurysm Mother        brain   Heart disease Mother 52       heart anyrism   Healthy Father        fairly   Diabetes Sister        half sister   Bipolar disorder Sister        half sister   Multiple sclerosis Sister    Colon cancer Maternal Grandmother    Stomach cancer Paternal Grandfather    Esophageal cancer Neg Hx    Inflammatory bowel disease Neg Hx    Liver disease Neg Hx    Pancreatic cancer Neg Hx    Rectal cancer Neg Hx    I have  reviewed her medical, social, and family history in detail and updated the electronic medical record as necessary.    PHYSICAL EXAMINATION  BP 112/64   Pulse 96   Ht _0  (1.575 m)   Wt 277 lb 6 oz (125.8 kg)   BMI 50.73 kg/m  Wt Readings from Last 3 Encounters:  05/02/22 277 lb 6 oz (125.8 kg)  04/13/22 273 lb 3.2 oz (123.9 kg)  04/12/22 272 lb 6 oz (123.5 kg)  GEN: NAD, appears stated age, doesn't appear chronically ill PSYCH: Cooperative, without pressured speech EYE: Conjunctivae pink, sclerae anicteric ENT: MMM, without oral ulcers CV: Nontachycardic RESP: No audible wheezing GI: NABS, soft, rounded, obese, NTD, without rebound or guarding MSK/EXT: No significant lower extremity edema SKIN: No jaundice NEURO:  Alert & Oriented x 3, no focal deficits   REVIEW OF DATA  I reviewed the following data at the time of this encounter:  GI Procedures and Studies  No new studies to review  Laboratory Studies  Reviewed those in epic  Imaging Studies  No new studies to review   ASSESSMENT  Ms. Prew is a 41 y.o. female with a pmh significant for Crohn's ileocolitis disease (status post ileocecectomy) and now on every 4 week Infliximab/Avsola, status post appendectomy, obesity, allergies, iron deficiency.  The patient is seen today for evaluation and management of:  1. Crohn's disease of intestine with other complication ([place])   2. Iron deficiency anemia due to chronic blood loss   3. Abnormal colonoscopy   4. Immunosuppression due to drug therapy [facility])    The patient is clinically and hemodynamically stable at this time.  She seems to be in a clinical remission currently and her last fecal calprotectin would suggest things are much improved.  Though her endoscopic appearance earlier this year was certainly concerning.  I think she is tolerating the every 4 week Avsola infusions based on her last therapeutic drug monitoring.  I would like her to have her next infusion in  December and then in January before her infusion, she should have therapeutic drug monitoring performed.  Depending on the values there, we will want to consider continuing her every 4 week  dosing versus if she is doing really well considering every 6 week dosing.  I think it would be reasonable to also consider low-dose immunomodulation and so we will see what her labs look like for [name] to determine those next steps.  She should get labs at least every other infusion to see how she is doing and what her inflammatory markers are doing.  We will consider the role of a repeat colonoscopic evaluation in 2024 pending her overall improvement maintaining.  All patient questions were answered to the best of my ability, and the patient agrees to the aforementioned plan of action with follow-up as indicated.   PLAN  Continue Avsola 10 mg/kg every 4 week dosing Laboratories to be obtained prior to your next infusion as outlined below Before January 20 24 infusion, we will want therapeutic drug monitoring and you will come into our lab to have this performed Would like CBC/CMP/ESR/CRP to be drawn every other infusion Consider immunomodulation pending therapeutic drug monitoring results to decrease antibody development   Orders Placed This Encounter  Procedures   CBC   Comp Met (CMET)   Sedimentation rate   C-reactive protein    New Prescriptions   No medications on file   Modified Medications   No medications on file    Planned Follow Up No follow-ups on file.   Total Time in Face-to-Face and in Coordination of Care for patient including independent/personal interpretation/review of prior testing, medical history, examination, medication adjustment, communicating results with the patient directly, and documentation within the EHR is 25 minutes.   Justice Britain, MD [facility] Office # [phone]

## 2022-05-10 ENCOUNTER — Other Ambulatory Visit: Payer: Self-pay

## 2022-05-10 DIAGNOSIS — K50918 Crohn's disease, unspecified, with other complication: Secondary | ICD-10-CM

## 2022-05-11 ENCOUNTER — Other Ambulatory Visit (INDEPENDENT_AMBULATORY_CARE_PROVIDER_SITE_OTHER): Payer: BC Managed Care – PPO

## 2022-05-11 ENCOUNTER — Other Ambulatory Visit: Payer: Self-pay

## 2022-05-11 ENCOUNTER — Ambulatory Visit: Payer: BC Managed Care – PPO

## 2022-05-11 VITALS — BP 111/74 | HR 80 | Temp 97.7°F | Resp 20 | Ht 65.0 in | Wt 272.5 lb

## 2022-05-11 DIAGNOSIS — K5000 Crohn's disease of small intestine without complications: Secondary | ICD-10-CM

## 2022-05-11 DIAGNOSIS — Z8719 Personal history of other diseases of the digestive system: Secondary | ICD-10-CM

## 2022-05-11 LAB — CBC WITH DIFFERENTIAL/PLATELET
Basophils Absolute: 0.1 10*3/uL (ref 0.0–0.1)
Basophils Relative: 1.1 % (ref 0.0–3.0)
Eosinophils Absolute: 0.2 10*3/uL (ref 0.0–0.7)
Eosinophils Relative: 1.5 % (ref 0.0–5.0)
HCT: 38.9 % (ref 36.0–46.0)
Hemoglobin: 12.4 g/dL (ref 12.0–15.0)
Lymphocytes Relative: 29.3 % (ref 12.0–46.0)
Lymphs Abs: 2.9 10*3/uL (ref 0.7–4.0)
MCHC: 31.8 g/dL (ref 30.0–36.0)
MCV: 87.3 fl (ref 78.0–100.0)
Monocytes Absolute: 0.8 10*3/uL (ref 0.1–1.0)
Monocytes Relative: 8.4 % (ref 3.0–12.0)
Neutro Abs: 5.8 10*3/uL (ref 1.4–7.7)
Neutrophils Relative %: 59.7 % (ref 43.0–77.0)
Platelets: 311 10*3/uL (ref 150.0–400.0)
RBC: 4.45 Mil/uL (ref 3.87–5.11)
RDW: 17.2 % — ABNORMAL HIGH (ref 11.5–15.5)
WBC: 9.8 10*3/uL (ref 4.0–10.5)

## 2022-05-11 LAB — SEDIMENTATION RATE: Sed Rate: 47 mm/hr — ABNORMAL HIGH (ref 0–20)

## 2022-05-11 LAB — COMPREHENSIVE METABOLIC PANEL
ALT: 11 U/L (ref 0–35)
AST: 17 U/L (ref 0–37)
Albumin: 3.6 g/dL (ref 3.5–5.2)
Alkaline Phosphatase: 65 U/L (ref 39–117)
BUN: 9 mg/dL (ref 6–23)
CO2: 28 mEq/L (ref 19–32)
Calcium: 8.6 mg/dL (ref 8.4–10.5)
Chloride: 105 mEq/L (ref 96–112)
Creatinine, Ser: 0.66 mg/dL (ref 0.40–1.20)
GFR: 110.76 mL/min (ref >60.00)
Glucose, Bld: 118 mg/dL — ABNORMAL HIGH (ref 70–99)
Potassium: 3.5 mEq/L (ref 3.5–5.1)
Sodium: 138 mEq/L (ref 135–145)
Total Bilirubin: 0.2 mg/dL (ref 0.2–1.2)
Total Protein: 7 g/dL (ref 6.0–8.3)

## 2022-05-11 LAB — HIGH SENSITIVITY CRP: CRP, High Sensitivity: 12.45 mg/L — ABNORMAL HIGH (ref 0.000–5.000)

## 2022-05-11 MED ORDER — SODIUM CHLORIDE 0.9 % IV SOLN
5.0000 mg/kg | INTRAVENOUS | Status: AC
  Administered 2022-05-11: 600 mg via INTRAVENOUS
  Filled 2022-05-11: qty 60

## 2022-05-11 MED ORDER — ACETAMINOPHEN 325 MG PO TABS: 650.0000 mg | ORAL_TABLET | Freq: Once | ORAL | Status: AC

## 2022-05-11 MED ORDER — METHYLPREDNISOLONE SODIUM SUCC 40 MG IJ SOLR: 40.0000 mg | Freq: Once | INTRAMUSCULAR | Status: DC

## 2022-05-11 MED ORDER — DIPHENHYDRAMINE HCL 25 MG PO CAPS: 25.0000 mg | ORAL_CAPSULE | Freq: Once | ORAL | Status: DC

## 2022-05-11 NOTE — Progress Notes (Unsigned)
Diagnosis: Crohn's Disease  Provider:  Marshell Garfinkel MD  Procedure: Infusion  IV Type: Peripheral, IV Location: L Forearm  Avsola (infliximab-axxq), Dose: 600 mg  Infusion Start Time: 0930  Infusion Stop Time: 1150  Post Infusion IV Care: Patient declined observation and Peripheral IV Discontinued  Discharge: Condition: Good, Destination: Home . AVS provided to patient.   Performed by:  Koren Shiver, RN

## 2022-06-06 ENCOUNTER — Other Ambulatory Visit: Payer: Self-pay

## 2022-06-06 ENCOUNTER — Other Ambulatory Visit (INDEPENDENT_AMBULATORY_CARE_PROVIDER_SITE_OTHER): Payer: BC Managed Care – PPO

## 2022-06-06 DIAGNOSIS — Z79899 Other long term (current) drug therapy: Secondary | ICD-10-CM | POA: Diagnosis not present

## 2022-06-06 DIAGNOSIS — D500 Iron deficiency anemia secondary to blood loss (chronic): Secondary | ICD-10-CM | POA: Diagnosis not present

## 2022-06-06 DIAGNOSIS — K50918 Crohn's disease, unspecified, with other complication: Secondary | ICD-10-CM

## 2022-06-06 DIAGNOSIS — D84821 Immunodeficiency due to drugs: Secondary | ICD-10-CM

## 2022-06-06 LAB — CBC
HCT: 37.7 % (ref 36.0–46.0)
Hemoglobin: 12.3 g/dL (ref 12.0–15.0)
MCHC: 32.7 g/dL (ref 30.0–36.0)
MCV: 85.5 fl (ref 78.0–100.0)
Platelets: 365 10*3/uL (ref 150.0–400.0)
RBC: 4.4 Mil/uL (ref 3.87–5.11)
RDW: 16.6 % — ABNORMAL HIGH (ref 11.5–15.5)
WBC: 11.2 10*3/uL — ABNORMAL HIGH (ref 4.0–10.5)

## 2022-06-06 LAB — COMPREHENSIVE METABOLIC PANEL
ALT: 10 U/L (ref 0–35)
AST: 14 U/L (ref 0–37)
Albumin: 3.5 g/dL (ref 3.5–5.2)
Alkaline Phosphatase: 77 U/L (ref 39–117)
BUN: 11 mg/dL (ref 6–23)
CO2: 27 mEq/L (ref 19–32)
Calcium: 8.9 mg/dL (ref 8.4–10.5)
Chloride: 103 mEq/L (ref 96–112)
Creatinine, Ser: 0.67 mg/dL (ref 0.40–1.20)
GFR: 110.3 mL/min (ref >60.00)
Glucose, Bld: 81 mg/dL (ref 70–99)
Potassium: 3.9 mEq/L (ref 3.5–5.1)
Sodium: 138 mEq/L (ref 135–145)
Total Bilirubin: 0.3 mg/dL (ref 0.2–1.2)
Total Protein: 7.4 g/dL (ref 6.0–8.3)

## 2022-06-06 LAB — SEDIMENTATION RATE: Sed Rate: 69 mm/hr — ABNORMAL HIGH (ref 0–20)

## 2022-06-06 LAB — C-REACTIVE PROTEIN: CRP: 2.1 mg/dL (ref 0.5–20.0)

## 2022-06-06 NOTE — Progress Notes (Signed)
Orders entered

## 2022-06-08 ENCOUNTER — Ambulatory Visit (INDEPENDENT_AMBULATORY_CARE_PROVIDER_SITE_OTHER): Payer: BC Managed Care – PPO | Admitting: *Deleted

## 2022-06-08 VITALS — BP 100/68 | HR 93 | Temp 97.0°F | Resp 16 | Ht 63.0 in | Wt 277.2 lb

## 2022-06-08 DIAGNOSIS — K5000 Crohn's disease of small intestine without complications: Secondary | ICD-10-CM

## 2022-06-08 DIAGNOSIS — Z8719 Personal history of other diseases of the digestive system: Secondary | ICD-10-CM

## 2022-06-08 MED ORDER — SODIUM CHLORIDE 0.9 % IV SOLN
5.0000 mg/kg | INTRAVENOUS | Status: DC
  Administered 2022-06-08: 600 mg via INTRAVENOUS
  Filled 2022-06-08: qty 60

## 2022-06-08 MED ORDER — METHYLPREDNISOLONE SODIUM SUCC 40 MG IJ SOLR: 40.0000 mg | Freq: Once | INTRAMUSCULAR | Status: DC

## 2022-06-08 MED ORDER — DIPHENHYDRAMINE HCL 25 MG PO CAPS: 25.0000 mg | ORAL_CAPSULE | Freq: Once | ORAL | Status: DC

## 2022-06-08 MED ORDER — ACETAMINOPHEN 325 MG PO TABS: 650.0000 mg | ORAL_TABLET | Freq: Once | ORAL | Status: DC

## 2022-06-08 NOTE — Patient Instructions (Addendum)
Excuse from Work, [employer], or Physical Activity _____[name]_____________________________________ needs to be excused from: __X__ Work. ____ School. ____ Physical activity. This is effective for the following dates: ____01/12/2024__________________________________. He or she may return to work or school, but should avoid physical activity or other activities from now until _______________. Activity restrictions include: ____ Lifting more than _________ lb / kg. ____ Sitting longer than __________ minutes at a time. ____ Standing longer than ________ minutes at a time. ____ Other activities including: ___________________________________________________________________________________ ____ He or she may return to full physical activity on ________________. Health care provider name (printed): _____Cathy Toney Rakes, RN________________________________________________ Health care provider (signature): _________________________________________________________ Date: ___01/12/2024___________________________ This information is not intended to replace advice given to you by your health care provider. Make sure you discuss any questions you have with your health care provider. Document Revised: 02/01/2021 Document Reviewed: 02/01/2021 Elsevier Patient Education  [place].

## 2022-06-08 NOTE — Progress Notes (Signed)
Diagnosis: Crohn's Disease  Provider:  Marshell Garfinkel MD  Procedure: Infusion  IV Type: Peripheral, IV Location: L Antecubital  Avsola (infliximab-axxq), Dose: 600 mg  Infusion Start Time: 4199  Infusion Stop Time: 1215  Post Infusion IV Care: Patient declined observation and Peripheral IV Discontinued  Discharge: Condition: Good, Destination: Home . AVS provided to patient.   Performed by:  Koren Shiver, RN

## 2022-06-11 LAB — INFLIXIMAB+AB (SERIAL MONITOR)
Anti-Infliximab Antibody: <22 ng/mL
Infliximab Drug Level: 19 ug/mL

## 2022-06-11 LAB — SERIAL MONITORING

## 2022-06-12 ENCOUNTER — Other Ambulatory Visit: Payer: Self-pay

## 2022-06-12 DIAGNOSIS — K50918 Crohn's disease, unspecified, with other complication: Secondary | ICD-10-CM

## 2022-06-13 ENCOUNTER — Ambulatory Visit (INDEPENDENT_AMBULATORY_CARE_PROVIDER_SITE_OTHER): Payer: BC Managed Care – PPO | Admitting: Adult Health

## 2022-06-13 ENCOUNTER — Encounter (INDEPENDENT_AMBULATORY_CARE_PROVIDER_SITE_OTHER): Payer: Self-pay | Admitting: Adult Health

## 2022-06-13 VITALS — BP 106/68 | HR 115 | Temp 99.8°F | Ht 63.0 in | Wt 273.0 lb

## 2022-06-13 DIAGNOSIS — Z6841 Body Mass Index (BMI) 40.0 and over, adult: Secondary | ICD-10-CM

## 2022-06-13 DIAGNOSIS — K50919 Crohn's disease, unspecified, with unspecified complications: Secondary | ICD-10-CM

## 2022-06-13 DIAGNOSIS — R7303 Prediabetes: Secondary | ICD-10-CM

## 2022-06-13 DIAGNOSIS — E669 Obesity, unspecified: Secondary | ICD-10-CM | POA: Diagnosis not present

## 2022-06-13 DIAGNOSIS — E559 Vitamin D deficiency, unspecified: Secondary | ICD-10-CM

## 2022-06-13 MED ORDER — METFORMIN HCL 500 MG PO TABS: ORAL_TABLET | ORAL | 0 refills | Status: DC

## 2022-06-14 LAB — VITAMIN B12: Vitamin B-12: 957 pg/mL (ref 232–1245)

## 2022-06-14 LAB — HEMOGLOBIN A1C
Est. average glucose Bld gHb Est-mCnc: 114 mg/dL
Hgb A1c MFr Bld: 5.6 % (ref 4.8–5.6)

## 2022-06-14 LAB — VITAMIN D 25 HYDROXY (VIT D DEFICIENCY, FRACTURES): Vit D, 25-Hydroxy: 39.5 ng/mL (ref 30.0–100.0)

## 2022-06-14 LAB — INSULIN, RANDOM: INSULIN: 11.5 u[IU]/mL (ref 2.6–24.9)

## 2022-06-20 NOTE — Progress Notes (Addendum)
Chief Complaint:   OBESITY [name] is here to discuss her progress with her obesity treatment plan along with follow-up of her obesity related diagnoses. [name] is on keeping a food journal and adhering to recommended goals of 1300 calories and 90 protein and states she is following her eating plan approximately 0% of the time. [name] states she is not exercising.  Today's visit was #: 32 Starting weight: 293 LBS Starting date: 10/02/2017 Today's weight: 273 LBS Today's date: 06/13/2022 Total lbs lost to date: 20 LBS Total lbs lost since last in-office visit: +23 LBS  Interim History:  Unable to complete IC at [facility] today due to acute URI sx's. She reports nasal congestion and pharyngitis. She used DayQuil yesterday and last night.  Last OV at [facility] was 12/26/21- she was lost to f/u due to the following events. -01/24/22 Hospitalization for syncope -02/01/22 ED visit for symptomatic anemia -02/09/22 ED for anemia She was out of work Aug-Oct  She is Music therapist at [employer]:   1. Pre-diabetes Lab Results  Component Value Date   HGBA1C 5.6 06/13/2022   HGBA1C 5.8 (H) 12/26/2021   HGBA1C 5.2 03/15/2021   She has not been taking Metformin '500mg'$  BID, however agreeable to re-starting therapy.  2. Crohn's disease with complication, unspecified gastrointestinal tract location [facility]) 05/02/22 GI OV Notes: [name] is a 42 y.o. female with a pmh significant for Crohn's ileocolitis disease (status post ileocecectomy) and now on every 4 week Infliximab/Avsola, status post appendectomy, obesity, allergies, iron deficiency.  The patient presents to the [facility] for an evaluation and management of problem(s) noted below: Since the patient's last clinic visit, the patient has been doing quite well.  Overall she is feeling better.  She is tolerating every 4 week Avsola infusions.  PLAN  Continue Avsola 10 mg/kg every 4 week dosing Laboratories  to be obtained prior to your next infusion as outlined below Before January 20 24 infusion, we will want therapeutic drug monitoring and you will come into our lab to have this performed Would like CBC/CMP/ESR/CRP to be drawn every other infusion Consider immunomodulation pending therapeutic drug monitoring results to decrease antibody development  3. Vitamin D deficiency Patient is on OTC vitamin D3 1000 IU daily.   Patient reports stable energy levels.  Assessment/Plan:   1. Pre-diabetes Refill- metFORMIN (GLUCOPHAGE) 500 MG tablet; 1/2 tab with breakfast daily for one week then increase to full tab daily with breakfast  Dispense: 30 tablet; Refill: 0  Check labs today.  - Hemoglobin A1c - Insulin, random - Vitamin B12  2. Crohn's disease with complication, unspecified gastrointestinal tract location [facility]) Follow-up with GI as directed.  3. Vitamin D deficiency Check labs today.  - VITAMIN D 25 Hydroxy (Vit-D Deficiency, Fractures)  4. Obesity, current BMI 48.4 [name] is currently in the action stage of change. As such, her goal is to continue with weight loss efforts. She has agreed to the Category 2 Plan+100 calorie.   Exercise goals:  Increase activity as tolerated.  Behavioral modification strategies: increasing lean protein intake, decreasing simple carbohydrates, meal planning and cooking strategies, keeping healthy foods in the home, and planning for success.  [name] has agreed to follow-up with our clinic in 3 weeks. She was informed of the importance of frequent follow-up visits to maximize her success with intensive lifestyle modifications for her multiple health conditions.   [name] was informed we would discuss her lab results at her next visit  unless there is a critical issue that needs to be addressed sooner. [name] agreed to keep her next visit at the agreed upon time to discuss these results.  Objective:   Blood pressure 106/68, pulse (!) 115, temperature  99.8 F (37.7 C), height '5\' 3"'$  (1.6 m), weight 273 lb (123.8 kg), SpO2 98 %, not currently breastfeeding. Body mass index is 48.36 kg/m.  General: Cooperative, alert, well developed, in no acute distress. HEENT: Conjunctivae and lids unremarkable. Cardiovascular: Regular rhythm.  Lungs: Normal work of breathing. Neurologic: No focal deficits.   Lab Results  Component Value Date   CREATININE 0.67 06/06/2022   BUN 11 06/06/2022   NA 138 06/06/2022   K 3.9 06/06/2022   CL 103 06/06/2022   CO2 27 06/06/2022   Lab Results  Component Value Date   ALT 10 06/06/2022   AST 14 06/06/2022   ALKPHOS 77 06/06/2022   BILITOT 0.3 06/06/2022   Lab Results  Component Value Date   HGBA1C 5.6 06/13/2022   HGBA1C 5.8 (H) 12/26/2021   HGBA1C 5.2 03/15/2021   HGBA1C 5.1 10/31/2020   HGBA1C 5.5 07/06/2020   Lab Results  Component Value Date   INSULIN 11.5 06/13/2022   INSULIN 5.1 12/26/2021   INSULIN 4.4 03/15/2021   INSULIN 4.2 10/31/2020   INSULIN 3.0 07/06/2020   Lab Results  Component Value Date   TSH 0.89 08/26/2019   Lab Results  Component Value Date   CHOL 166 03/15/2021   HDL 63 03/15/2021   LDLCALC 92 03/15/2021   TRIG 57 03/15/2021   CHOLHDL 2.6 03/15/2021   Lab Results  Component Value Date   VD25OH 39.5 06/13/2022   VD25OH 37.53 02/07/2022   VD25OH 44.3 12/26/2021   Lab Results  Component Value Date   WBC 11.2 (H) 06/06/2022   HGB 12.3 06/06/2022   HCT 37.7 06/06/2022   MCV 85.5 06/06/2022   PLT 365.0 06/06/2022   Lab Results  Component Value Date   IRON 24 (L) 04/12/2022   TIBC 318 04/12/2022   FERRITIN 27 04/12/2022   Attestation Statements:   Reviewed by clinician on day of visit: allergies, medications, problem list, medical history, surgical history, family history, social history, and previous encounter notes.  I, Davy Pique, RMA, am acting as Location manager for Mina Marble, NP.  I have reviewed the above documentation for accuracy and  completeness, and I agree with the above. -  Asriel Westrup d. Libero Puthoff, NP-C

## 2022-07-06 ENCOUNTER — Ambulatory Visit (INDEPENDENT_AMBULATORY_CARE_PROVIDER_SITE_OTHER): Payer: BC Managed Care – PPO

## 2022-07-06 ENCOUNTER — Other Ambulatory Visit (INDEPENDENT_AMBULATORY_CARE_PROVIDER_SITE_OTHER): Payer: Self-pay | Admitting: Adult Health

## 2022-07-06 VITALS — BP 113/70 | HR 78 | Temp 96.9°F | Resp 22 | Ht 63.0 in | Wt 275.8 lb

## 2022-07-06 DIAGNOSIS — K5000 Crohn's disease of small intestine without complications: Secondary | ICD-10-CM | POA: Diagnosis not present

## 2022-07-06 DIAGNOSIS — R7303 Prediabetes: Secondary | ICD-10-CM

## 2022-07-06 DIAGNOSIS — Z8719 Personal history of other diseases of the digestive system: Secondary | ICD-10-CM | POA: Diagnosis not present

## 2022-07-06 MED ORDER — ACETAMINOPHEN 325 MG PO TABS: 650.0000 mg | ORAL_TABLET | Freq: Once | ORAL | Status: DC

## 2022-07-06 MED ORDER — DIPHENHYDRAMINE HCL 25 MG PO CAPS: 25.0000 mg | ORAL_CAPSULE | Freq: Once | ORAL | Status: DC

## 2022-07-06 MED ORDER — SODIUM CHLORIDE 0.9 % IV SOLN
5.0000 mg/kg | INTRAVENOUS | Status: DC
  Administered 2022-07-06: 600 mg via INTRAVENOUS
  Filled 2022-07-06: qty 60

## 2022-07-06 MED ORDER — METHYLPREDNISOLONE SODIUM SUCC 40 MG IJ SOLR: 40.0000 mg | Freq: Once | INTRAMUSCULAR | Status: DC

## 2022-07-06 NOTE — Progress Notes (Signed)
Diagnosis: Infusion  Provider:  Marshell Garfinkel MD  Procedure: Infusion  IV Type: Peripheral, IV Location: L Antecubital  Avsola (infliximab-axxq), Dose: 600 mg  Infusion Start Time: [id number]  Infusion Stop Time: [id number]  Post Infusion IV Care: Peripheral IV Discontinued  Discharge: Condition: Good, Destination: Home . AVS Provided  Performed by:  Adelina Mings, LPN

## 2022-07-10 ENCOUNTER — Other Ambulatory Visit: Payer: Self-pay | Admitting: Obstetrics and Gynecology

## 2022-07-10 DIAGNOSIS — N920 Excessive and frequent menstruation with regular cycle: Secondary | ICD-10-CM

## 2022-07-11 ENCOUNTER — Encounter (INDEPENDENT_AMBULATORY_CARE_PROVIDER_SITE_OTHER): Payer: Self-pay | Admitting: Adult Health

## 2022-07-11 ENCOUNTER — Ambulatory Visit (INDEPENDENT_AMBULATORY_CARE_PROVIDER_SITE_OTHER): Payer: BC Managed Care – PPO | Admitting: Adult Health

## 2022-07-11 VITALS — BP 122/83 | HR 89 | Temp 97.6°F | Ht 63.0 in | Wt 272.0 lb

## 2022-07-11 DIAGNOSIS — E559 Vitamin D deficiency, unspecified: Secondary | ICD-10-CM

## 2022-07-11 DIAGNOSIS — R7303 Prediabetes: Secondary | ICD-10-CM

## 2022-07-11 DIAGNOSIS — E669 Obesity, unspecified: Secondary | ICD-10-CM

## 2022-07-11 DIAGNOSIS — R0602 Shortness of breath: Secondary | ICD-10-CM | POA: Diagnosis not present

## 2022-07-11 DIAGNOSIS — Z6841 Body Mass Index (BMI) 40.0 and over, adult: Secondary | ICD-10-CM

## 2022-07-11 MED ORDER — VITAMIN D (ERGOCALCIFEROL) 1.25 MG (50000 UNIT) PO CAPS: 50000.0000 [IU] | ORAL_CAPSULE | ORAL | 0 refills | Status: DC

## 2022-07-11 NOTE — Progress Notes (Signed)
Chief Complaint:   OBESITY [name] is here to discuss her progress with her obesity treatment plan along with follow-up of her obesity related diagnoses. [name] is on the Category 2 Plan and states she is following her eating plan approximately 25% of the time.  [name] states she is not currently exercising.  Today's visit was #: 35 Starting weight: 293 lbs Starting date: 10/02/2017 Today's weight: 272 lbs Today's date: 07/11/2022 Total lbs lost to date: 21 lbs Total lbs lost since last in-office visit: - lbs  Interim History:  10/02/2017 RMR 1896 09/30/2019 RMR 2026 06/02/2021 RMR 1526 Today- 07/11/2022 RMR 1699 Metabolism has increased by 173 cal in 24 period-however still slower than expected.  Subjective:   1. Pre-diabetes Discussed labs Lab Results  Component Value Date   HGBA1C 5.6 06/13/2022   HGBA1C 5.8 (H) 12/26/2021   HGBA1C 5.2 03/15/2021   She has not started Metformin therapy, she has picked up Rx Again discussed risks/benefits of Biguanide therapy.  2. Shortness of breath on exertion Pt endorses dyspnea with exertion, denies CP with exertion. 10/02/2017 RMR 1896 09/30/2019 RMR 2026 06/02/2021 RMR 1526 Today- 07/11/2022 RMR 1699 Metabolism has increased by 173 cal in 24 period-however still slower than expected.  3. Vitamin D deficiency Discussed labs  Latest Reference Range & Units 06/13/22 09:14  Vitamin D, 25-Hydroxy 30.0 - 100.0 ng/mL 39.5  Currently on OTC Vit D3 1000 IU and prenatal Multivitamin She endorses fatigue. She has a 30 month old daughter.   Assessment/Plan:   1. Pre-diabetes Start Metformin as directed.  2. Shortness of breath on exertion Check IC  3. Vitamin D deficiency Stop OTC VIt D3 1000 IU Continue OTC Prenatal Multivitamin Start Ergocalciferol 50,000 IU once weekly Cap Disp 4 RF 0  4. Obesity, current BMI 48.3  [name] is currently in the action stage of change. As such, her goal is to get back to weightloss efforts . She has  agreed to keeping a food journal and adhering to recommended goals of 1200 calories and 85 protein. May also follow Cat 2 Meal plan  Handouts- Cat 2 Meal plan, Grocery List 100/200 Cal Snack Lists  Exercise goals: All adults should avoid inactivity. Some physical activity is better than none, and adults who participate in any amount of physical activity gain some health benefits.  Behavioral modification strategies: increasing lean protein intake, decreasing simple carbohydrates, increasing vegetables, increasing water intake, meal planning and cooking strategies, keeping healthy foods in the home, and planning for success.  [name] has agreed to follow-up with our clinic in 2 weeks. She was informed of the importance of frequent follow-up visits to maximize her success with intensive lifestyle modifications for her multiple health conditions.   Objective:   Blood pressure 122/83, pulse 89, temperature 97.6 F (36.4 C), height 5' 3"$  (1.6 m), weight 272 lb (123.4 kg), SpO2 97 %, not currently breastfeeding. Body mass index is 48.18 kg/m.  General: Cooperative, alert, well developed, in no acute distress. HEENT: Conjunctivae and lids unremarkable. Cardiovascular: Regular rhythm.  Lungs: Normal work of breathing. Neurologic: No focal deficits.   Lab Results  Component Value Date   CREATININE 0.67 06/06/2022   BUN 11 06/06/2022   NA 138 06/06/2022   K 3.9 06/06/2022   CL 103 06/06/2022   CO2 27 06/06/2022   Lab Results  Component Value Date   ALT 10 06/06/2022   AST 14 06/06/2022   ALKPHOS 77 06/06/2022   BILITOT 0.3 06/06/2022   Lab  Results  Component Value Date   HGBA1C 5.6 06/13/2022   HGBA1C 5.8 (H) 12/26/2021   HGBA1C 5.2 03/15/2021   HGBA1C 5.1 10/31/2020   HGBA1C 5.5 07/06/2020   Lab Results  Component Value Date   INSULIN 11.5 06/13/2022   INSULIN 5.1 12/26/2021   INSULIN 4.4 03/15/2021   INSULIN 4.2 10/31/2020   INSULIN 3.0 07/06/2020   Lab Results   Component Value Date   TSH 0.89 08/26/2019   Lab Results  Component Value Date   CHOL 166 03/15/2021   HDL 63 03/15/2021   LDLCALC 92 03/15/2021   TRIG 57 03/15/2021   CHOLHDL 2.6 03/15/2021   Lab Results  Component Value Date   VD25OH 39.5 06/13/2022   VD25OH 37.53 02/07/2022   VD25OH 44.3 12/26/2021   Lab Results  Component Value Date   WBC 11.2 (H) 06/06/2022   HGB 12.3 06/06/2022   HCT 37.7 06/06/2022   MCV 85.5 06/06/2022   PLT 365.0 06/06/2022   Lab Results  Component Value Date   IRON 24 (L) 04/12/2022   TIBC 318 04/12/2022   FERRITIN 27 04/12/2022    Attestation Statements:   Reviewed by clinician on day of visit: allergies, medications, problem list, medical history, surgical history, family history, social history, and previous encounter notes.   I have reviewed the above documentation for accuracy and completeness, and I agree with the above. -  Ranie Chinchilla d. Alanni Vader, NP-C

## 2022-07-20 ENCOUNTER — Other Ambulatory Visit (INDEPENDENT_AMBULATORY_CARE_PROVIDER_SITE_OTHER): Payer: Self-pay | Admitting: Adult Health

## 2022-07-20 DIAGNOSIS — R7303 Prediabetes: Secondary | ICD-10-CM

## 2022-07-23 ENCOUNTER — Telehealth: Payer: Self-pay | Admitting: Hematology and Oncology

## 2022-07-23 NOTE — Telephone Encounter (Signed)
Rescheduled patient per [place] being out of office, left voicemail for patient to call back.

## 2022-07-30 ENCOUNTER — Ambulatory Visit (INDEPENDENT_AMBULATORY_CARE_PROVIDER_SITE_OTHER): Payer: BC Managed Care – PPO | Admitting: Adult Health

## 2022-07-30 ENCOUNTER — Encounter (INDEPENDENT_AMBULATORY_CARE_PROVIDER_SITE_OTHER): Payer: Self-pay | Admitting: Adult Health

## 2022-07-30 VITALS — BP 125/88 | HR 81 | Temp 97.9°F | Ht 63.0 in | Wt 271.0 lb

## 2022-07-30 DIAGNOSIS — E669 Obesity, unspecified: Secondary | ICD-10-CM

## 2022-07-30 DIAGNOSIS — Z6841 Body Mass Index (BMI) 40.0 and over, adult: Secondary | ICD-10-CM | POA: Diagnosis not present

## 2022-07-30 DIAGNOSIS — R7303 Prediabetes: Secondary | ICD-10-CM

## 2022-07-30 DIAGNOSIS — E559 Vitamin D deficiency, unspecified: Secondary | ICD-10-CM | POA: Diagnosis not present

## 2022-07-30 MED ORDER — METFORMIN HCL 500 MG PO TABS: ORAL_TABLET | ORAL | 0 refills | Status: DC

## 2022-07-30 NOTE — Progress Notes (Signed)
Chief Complaint:   OBESITY [name] is here to discuss her progress with her obesity treatment plan along with follow-up of her obesity related diagnoses. [name] is on the Category 2 Plan and states she is following her eating plan approximately 50% of the time.  [name] states she is not currently exercising.  Today's visit was #: 16 Starting weight: 293 lbs Starting date: 10/02/2017 Today's weight: 271 lbs Today's date: 07/30/2022 Total lbs lost to date: 22 lbs Total lbs lost since last in-office visit: - 1 lb  Interim History:  She is has been using Cat 2 Meal Plan as "template" and then tracking intake. She has been increasing protein at each meal and trying to incorporate more vegetables at dinner time.  She endorses decreased sugar/CHO cravings since starting Metformin therapy at last OV.  Subjective:   1. Vitamin D deficiency  Latest Reference Range & Units 06/13/22 09:14  Vitamin D, 25-Hydroxy 30.0 - 100.0 ng/mL 39.5  At last OV- Stopped OTC VIt D3 1000 IU Continued on OTC Prenatal Multivitamin Started weekly Ergocalciferol  2. Pre-diabetes Lab Results  Component Value Date   HGBA1C 5.6 06/13/2022   HGBA1C 5.8 (H) 12/26/2021   HGBA1C 5.2 03/15/2021  At last OV-  She started Metformin therapy at last OV- has titrated up to full tablet '500mg'$  QD. She endorses overall decreased sugar and CHO cravings since starting Metformin, denies GI upset. She is till experiencing late afternoon polyphagia, however has been trying to choose higher protein snacks.  Assessment/Plan:   1. Vitamin D deficiency Continue daily prenatal and weekly Ergocalciferol   2. Pre-diabetes Reill and Increase Metformin therapy- Metformin '500mg'$  full tab at breakfast and 1/2 tab at lunch  Disp 45 RF 0  3. Obesity, current BMI 48.02  [place] is currently in the action stage of change. As such, her goal is to continue with weight loss efforts. She has agreed to keeping a food journal and adhering  to recommended goals of 1200 calories and 85 protein. Continue to use Cat 2 Meal plan as needed. Journaling guidelines 1200 cal with 85 g protein per day.  Exercise goals:  Walking Program on YouTube at least once a week  Behavioral modification strategies: increasing lean protein intake, decreasing simple carbohydrates, increasing vegetables, increasing water intake, no skipping meals, meal planning and cooking strategies, keeping healthy foods in the home, better snacking choices, planning for success, and keeping a strict food journal.  [name] has agreed to follow-up with our clinic in 3 weeks. She was informed of the importance of frequent follow-up visits to maximize her success with intensive lifestyle modifications for her multiple health conditions.   Objective:   Blood pressure 125/88, pulse 81, temperature 97.9 F (36.6 C), height '5\' 3"'$  (1.6 m), weight 271 lb (122.9 kg), SpO2 99 %, not currently breastfeeding. Body mass index is 48.01 kg/m.  General: Cooperative, alert, well developed, in no acute distress. HEENT: Conjunctivae and lids unremarkable. Cardiovascular: Regular rhythm.  Lungs: Normal work of breathing. Neurologic: No focal deficits.   Lab Results  Component Value Date   CREATININE 0.67 06/06/2022   BUN 11 06/06/2022   NA 138 06/06/2022   K 3.9 06/06/2022   CL 103 06/06/2022   CO2 27 06/06/2022   Lab Results  Component Value Date   ALT 10 06/06/2022   AST 14 06/06/2022   ALKPHOS 77 06/06/2022   BILITOT 0.3 06/06/2022   Lab Results  Component Value Date   HGBA1C 5.6 06/13/2022  HGBA1C 5.8 (H) 12/26/2021   HGBA1C 5.2 03/15/2021   HGBA1C 5.1 10/31/2020   HGBA1C 5.5 07/06/2020   Lab Results  Component Value Date   INSULIN 11.5 06/13/2022   INSULIN 5.1 12/26/2021   INSULIN 4.4 03/15/2021   INSULIN 4.2 10/31/2020   INSULIN 3.0 07/06/2020   Lab Results  Component Value Date   TSH 0.89 08/26/2019   Lab Results  Component Value Date   CHOL 166  03/15/2021   HDL 63 03/15/2021   LDLCALC 92 03/15/2021   TRIG 57 03/15/2021   CHOLHDL 2.6 03/15/2021   Lab Results  Component Value Date   VD25OH 39.5 06/13/2022   VD25OH 37.53 02/07/2022   VD25OH 44.3 12/26/2021   Lab Results  Component Value Date   WBC 11.2 (H) 06/06/2022   HGB 12.3 06/06/2022   HCT 37.7 06/06/2022   MCV 85.5 06/06/2022   PLT 365.0 06/06/2022   Lab Results  Component Value Date   IRON 24 (L) 04/12/2022   TIBC 318 04/12/2022   FERRITIN 27 04/12/2022   Attestation Statements:   Reviewed by clinician on day of visit: allergies, medications, problem list, medical history, surgical history, family history, social history, and previous encounter notes.  I have reviewed the above documentation for accuracy and completeness, and I agree with the above. -  Savier Trickett d. Alaster Asfaw, NP-C

## 2022-08-03 ENCOUNTER — Ambulatory Visit (INDEPENDENT_AMBULATORY_CARE_PROVIDER_SITE_OTHER): Payer: BC Managed Care – PPO

## 2022-08-03 VITALS — BP 99/66 | HR 81 | Temp 97.3°F | Resp 20 | Ht 62.5 in | Wt 282.4 lb

## 2022-08-03 DIAGNOSIS — K5000 Crohn's disease of small intestine without complications: Secondary | ICD-10-CM | POA: Diagnosis not present

## 2022-08-03 DIAGNOSIS — Z8719 Personal history of other diseases of the digestive system: Secondary | ICD-10-CM | POA: Diagnosis not present

## 2022-08-03 MED ORDER — DIPHENHYDRAMINE HCL 25 MG PO CAPS: 25.0000 mg | ORAL_CAPSULE | Freq: Once | ORAL | Status: DC

## 2022-08-03 MED ORDER — METHYLPREDNISOLONE SODIUM SUCC 40 MG IJ SOLR: 40.0000 mg | Freq: Once | INTRAMUSCULAR | Status: DC

## 2022-08-03 MED ORDER — ACETAMINOPHEN 325 MG PO TABS: 650.0000 mg | ORAL_TABLET | Freq: Once | ORAL | Status: DC

## 2022-08-03 MED ORDER — SODIUM CHLORIDE 0.9 % IV SOLN
5.0000 mg/kg | INTRAVENOUS | Status: DC
  Administered 2022-08-03: 600 mg via INTRAVENOUS
  Filled 2022-08-03: qty 60

## 2022-08-03 NOTE — Progress Notes (Signed)
Diagnosis: Crohn's Disease  Provider:  Marshell Garfinkel MD  Procedure: Infusion  IV Type: Peripheral, IV Location: L Antecubital  Avsola (infliximab-axxq), Dose: 600 mg  Infusion Start Time: [id number]  Infusion Stop Time: [id number]  Post Infusion IV Care: Patient declined observation and Peripheral IV Discontinued  Discharge: Condition: Good, Destination: Home . AVS Declined  Performed by:  Cleophus Molt, RN

## 2022-08-20 ENCOUNTER — Ambulatory Visit
Admission: RE | Admit: 2022-08-20 | Discharge: 2022-08-20 | Disposition: A | Discharge status: Home / Self Care | Payer: BC Managed Care – PPO | Source: Ambulatory Visit | Attending: Obstetrics and Gynecology | Admitting: Obstetrics and Gynecology

## 2022-08-20 ENCOUNTER — Encounter (INDEPENDENT_AMBULATORY_CARE_PROVIDER_SITE_OTHER): Payer: Self-pay | Admitting: Adult Health

## 2022-08-20 ENCOUNTER — Ambulatory Visit (INDEPENDENT_AMBULATORY_CARE_PROVIDER_SITE_OTHER): Payer: BC Managed Care – PPO | Admitting: Adult Health

## 2022-08-20 VITALS — BP 119/77 | HR 84 | Temp 97.8°F | Ht 63.0 in | Wt 275.0 lb

## 2022-08-20 DIAGNOSIS — E669 Obesity, unspecified: Secondary | ICD-10-CM | POA: Diagnosis not present

## 2022-08-20 DIAGNOSIS — R632 Polyphagia: Secondary | ICD-10-CM

## 2022-08-20 DIAGNOSIS — Z6841 Body Mass Index (BMI) 40.0 and over, adult: Secondary | ICD-10-CM

## 2022-08-20 DIAGNOSIS — N920 Excessive and frequent menstruation with regular cycle: Secondary | ICD-10-CM

## 2022-08-20 DIAGNOSIS — R7303 Prediabetes: Secondary | ICD-10-CM | POA: Diagnosis not present

## 2022-08-20 DIAGNOSIS — E559 Vitamin D deficiency, unspecified: Secondary | ICD-10-CM

## 2022-08-20 MED ORDER — VITAMIN D (ERGOCALCIFEROL) 1.25 MG (50000 UNIT) PO CAPS: 50000.0000 [IU] | ORAL_CAPSULE | ORAL | 0 refills | Status: DC

## 2022-08-20 MED ORDER — METFORMIN HCL 500 MG PO TABS: ORAL_TABLET | ORAL | 0 refills | Status: DC

## 2022-08-20 NOTE — Progress Notes (Addendum)
WEIGHT SUMMARY AND BIOMETRICS  Vitals Temp: 97.8 F (36.6 C) BP: 119/77 Pulse Rate: 84 SpO2: 99 %   Anthropometric Measurements Height:  (1.6 m) Weight: 275 lb (124.7 kg) BMI (Calculated): 48.73 Weight at Last Visit: 271 lb Weight Gained Since Last Visit: 4 lb Starting Weight: 293 lb Total Weight Loss (lbs): 58 lb (26.3 kg)   Body Composition  Body Fat %: 46.6 % Fat Mass (lbs): 128.2 lbs Muscle Mass (lbs): 139.4 lbs Total Body Water (lbs): 98.4 lbs Visceral Fat Rating : 15   Other Clinical Data Fasting: No Labs: No Today's Visit #: 37 Starting Date: 10/02/17    Chief Complaint:   OBESITY [name] is here to discuss her progress with her obesity treatment plan. She is on the the Category 2 Plan and keeping a food journal and adhering to recommended goals of 1200 calories and 85 protein and states she is following her eating plan approximately 25 % of the time.  She states she is not currently exercising.   Interim History:  Morning routine- she wakes herself and her 78 month old daughter "[name]" up NLT 0600. 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  She is on spring break during [employer].  She has not been exercising regularly.  Her husband works Consulting civil engineer- he works in an office, which is about 25 mins from their home.  She packs her lunch and takes food and Metformin to work daily, however has not been able to take Metformin  1/2 tab at lunch- either she forgets or she feels that she didn't have time.  Subjective:   1. Pre-diabetes  Latest Reference Range & Units 06/13/22 09:14  Hemoglobin A1C 4.8 - 5.6 % 5.6  Est. average glucose Bld gHb Est-mCnc mg/dL 540  INSULIN 2.6 - 98.1 uIU/mL 11.5  She is 100% compliant with morning Metformin , 0% with lunch Metformin  1/2 tab   2. Vitamin D deficiency  Latest Reference Range & Units 06/13/22 09:14  Vitamin D, 25-Hydroxy  30.0 - 100.0 ng/mL 39.5  She endorse stable energy levels. She is on weekly Ergocalciferol- denies N/V/Muscle Weakness  3. Polyphagia She endorses late afternoon polyphagia- she has not taken 1/2 tab Metformin  at lunch.   Assessment/Plan:   1. Pre-diabetes Refill - metFORMIN (GLUCOPHAGE) 500 MG tablet; 1 full tab daily with breakfast and 1/2 tab at lunch  Dispense: 45 tablet; Refill: 0 Pack lunch Metformin in with food and set reminder to take when eating lunch  2. Vitamin D deficiency Refill Ergocalciferol 50,000 IU once week Disp 4 RF 0  3. Polyphagia Pack lunch Metformin in with food and set reminder to take when eating lunch  4. Obesity, current BMI 48.90  [name] is currently in the action stage of change. As such, her goal is to continue with weight loss efforts. She has agreed to the Category 2 Plan and keeping a food journal and adhering to recommended goals of 1200 calories and 85 protein.   Exercise goals: All adults should avoid inactivity. Some physical activity is better than none, and adults who participate in any amount of physical activity gain some health benefits. For additional and more extensive health benefits, adults should increase their aerobic physical activity to 300 minutes (5 hours) a week of moderate-intensity, or 150 minutes a week of vigorous-intensity aerobic physical activity, or an equivalent combination of moderate- and vigorous-intensity activity. Additional health  benefits are gained by engaging in physical activity beyond this amount.  Adults should also include muscle-strengthening activities that involve all major muscle groups on 2 or more days a week.  By Next OV:  - 15 min walk and 15 min strength training- each 1 x week   Behavioral modification strategies: increasing lean protein intake, decreasing simple carbohydrates, increasing vegetables, increasing water intake, decreasing liquid calories, better snacking choices, and planning for  success.  [name] has agreed to follow-up with our clinic in 4 weeks. She was informed of the importance of frequent follow-up visits to maximize her success with intensive lifestyle modifications for her multiple health conditions.   Objective:   Blood pressure 119/77, pulse 84, temperature 97.8 F (36.6 C), height 5\' 3"  (1.6 m), weight 275 lb (124.7 kg), SpO2 99 %, not currently breastfeeding. Body mass index is 48.71 kg/m.  General: Cooperative, alert, well developed, in no acute distress. HEENT: Conjunctivae and lids unremarkable. Cardiovascular: Regular rhythm.  Lungs: Normal work of breathing. Neurologic: No focal deficits.   Lab Results  Component Value Date   CREATININE 0.67 06/06/2022   BUN 11 06/06/2022   NA 138 06/06/2022   K 3.9 06/06/2022   CL 103 06/06/2022   CO2 27 06/06/2022   Lab Results  Component Value Date   ALT 10 06/06/2022   AST 14 06/06/2022   ALKPHOS 77 06/06/2022   BILITOT 0.3 06/06/2022   Lab Results  Component Value Date   HGBA1C 5.6 06/13/2022   HGBA1C 5.8 (H) 12/26/2021   HGBA1C 5.2 03/15/2021   HGBA1C 5.1 10/31/2020   HGBA1C 5.5 07/06/2020   Lab Results  Component Value Date   INSULIN 11.5 06/13/2022   INSULIN 5.1 12/26/2021   INSULIN 4.4 03/15/2021   INSULIN 4.2 10/31/2020   INSULIN 3.0 07/06/2020   Lab Results  Component Value Date   TSH 0.89 08/26/2019   Lab Results  Component Value Date   CHOL 166 03/15/2021   HDL 63 03/15/2021   LDLCALC 92 03/15/2021   TRIG 57 03/15/2021   CHOLHDL 2.6 03/15/2021   Lab Results  Component Value Date   VD25OH 39.5 06/13/2022   VD25OH 37.53 02/07/2022   VD25OH 44.3 12/26/2021   Lab Results  Component Value Date   WBC 11.2 (H) 06/06/2022   HGB 12.3 06/06/2022   HCT 37.7 06/06/2022   MCV 85.5 06/06/2022   PLT 365.0 06/06/2022   Lab Results  Component Value Date   IRON 24 (L) 04/12/2022   TIBC 318 04/12/2022   FERRITIN 27 04/12/2022    Attestation Statements:   Reviewed by  clinician on day of visit: allergies, medications, problem list, medical history, surgical history, family history, social history, and previous encounter notes.  I have reviewed the above documentation for accuracy and completeness, and I agree with the above. -  Elita Dame d. Claudina Oliphant, NP-C

## 2022-08-22 ENCOUNTER — Other Ambulatory Visit (INDEPENDENT_AMBULATORY_CARE_PROVIDER_SITE_OTHER): Payer: Self-pay | Admitting: Adult Health

## 2022-08-22 DIAGNOSIS — R7303 Prediabetes: Secondary | ICD-10-CM

## 2022-08-31 ENCOUNTER — Ambulatory Visit (INDEPENDENT_AMBULATORY_CARE_PROVIDER_SITE_OTHER): Payer: BC Managed Care – PPO

## 2022-08-31 ENCOUNTER — Other Ambulatory Visit: Payer: Self-pay

## 2022-08-31 VITALS — BP 124/77 | HR 77 | Temp 98.2°F | Resp 20 | Ht 62.5 in | Wt 282.8 lb

## 2022-08-31 DIAGNOSIS — K5000 Crohn's disease of small intestine without complications: Secondary | ICD-10-CM

## 2022-08-31 DIAGNOSIS — Z8719 Personal history of other diseases of the digestive system: Secondary | ICD-10-CM | POA: Diagnosis not present

## 2022-08-31 MED ORDER — ACETAMINOPHEN 325 MG PO TABS: 650.0000 mg | ORAL_TABLET | Freq: Once | ORAL | Status: DC

## 2022-08-31 MED ORDER — DIPHENHYDRAMINE HCL 25 MG PO CAPS: 25.0000 mg | ORAL_CAPSULE | Freq: Once | ORAL | Status: DC

## 2022-08-31 MED ORDER — METHYLPREDNISOLONE SODIUM SUCC 40 MG IJ SOLR: 40.0000 mg | Freq: Once | INTRAMUSCULAR | Status: DC

## 2022-08-31 MED ORDER — SODIUM CHLORIDE 0.9 % IV SOLN
5.0000 mg/kg | INTRAVENOUS | Status: DC
  Administered 2022-08-31: 600 mg via INTRAVENOUS
  Filled 2022-08-31: qty 60

## 2022-08-31 NOTE — Progress Notes (Signed)
Diagnosis: Crohn's Disease  Provider:  Chilton Greathouse MD  Procedure: Infusion  IV Type: Peripheral, IV Location: L Hand  Avsola (infliximab-axxq), Dose: 600 mg  Infusion Start Time: 0956  Infusion Stop Time: 1215  Post Infusion IV Care: Peripheral IV Discontinued  Discharge: Condition: Good, Destination: Home . AVS Declined  Performed by:  Adriana Mccallum, RN

## 2022-09-03 ENCOUNTER — Encounter: Payer: Self-pay | Admitting: Gastroenterology

## 2022-09-03 ENCOUNTER — Encounter: Payer: Self-pay | Admitting: Hematology and Oncology

## 2022-09-11 ENCOUNTER — Ambulatory Visit (INDEPENDENT_AMBULATORY_CARE_PROVIDER_SITE_OTHER): Payer: BC Managed Care – PPO

## 2022-09-11 ENCOUNTER — Ambulatory Visit: Payer: BC Managed Care – PPO | Admitting: Family Medicine

## 2022-09-11 VITALS — BP 136/80 | HR 104 | Temp 98.5°F | Resp 12 | Ht 62.5 in | Wt 278.5 lb

## 2022-09-11 DIAGNOSIS — R053 Chronic cough: Secondary | ICD-10-CM

## 2022-09-11 DIAGNOSIS — M79671 Pain in right foot: Secondary | ICD-10-CM

## 2022-09-11 DIAGNOSIS — J988 Other specified respiratory disorders: Secondary | ICD-10-CM

## 2022-09-11 MED ORDER — BENZONATATE 100 MG PO CAPS
200.0000 mg | ORAL_CAPSULE | Freq: Two times a day (BID) | ORAL | 0 refills | Status: DC | PRN
Start: 2022-09-11 — End: 2022-09-18

## 2022-09-11 MED ORDER — AZITHROMYCIN 250 MG PO TABS
ORAL_TABLET | ORAL | 0 refills | Status: AC
Start: 2022-09-11 — End: 2022-09-15

## 2022-09-11 MED ORDER — ALBUTEROL SULFATE HFA 108 (90 BASE) MCG/ACT IN AERS
2.0000 | INHALATION_SPRAY | Freq: Four times a day (QID) | RESPIRATORY_TRACT | 0 refills | Status: DC | PRN
Start: 2022-09-11 — End: 2022-09-18

## 2022-09-11 NOTE — Patient Instructions (Addendum)
A few things to remember from today's visit:  Respiratory tract infection - Plan: azithromycin (ZITHROMAX) 250 MG tablet, albuterol (VENTOLIN HFA) 108 (90 Base) MCG/ACT inhaler  Cough, persistent - Plan: DG Chest 2 View, benzonatate (TESSALON) 100 MG capsule  Pain of right heel  Plain Mucinex and adequate hydration. Albuterol inh 2 puff every 6 hours for a week then as needed for wheezing or shortness of breath.  Monitor for fever.  If you need refills for medications you take chronically, please call your pharmacy. Do not use My Chart to request refills or for acute issues that need immediate attention. If you send a my chart message, it may take a few days to be addressed, specially if I am not in the office.  Please be sure medication list is accurate. If a new problem present, please set up appointment sooner than planned today.

## 2022-09-11 NOTE — Progress Notes (Signed)
ACUTE VISIT Chief Complaint  Patient presents with   Nasal Congestion   Cough    X 10 days, productive. Started out with light yellow phlegm, now dark yellow.    Headache   low grade fever   HPI: [name] is a 42 y.o. female with PMHx significant for crohn's disease, vitamin D deficiency, prediabetes, iron deficiency anemia, and immunosuppressive state here today complaining of respiratory symptoms as described above. She traces the onset of her symptoms to April 2nd, following her child's illness contracted from daycare on March 27th. She has been self-medicating with Mucinex both during the day and at night, which initially seemed to alleviate her symptoms, but her cough has remained persistent. Over the recent weekend, she experienced an increase in fatigue and worsening congestion. This morning, she noted the presence of chills, a mild frontal pressure headache, and the production of dark yellow to brownish phlegm when coughing. Cough The current episode started 1 to 4 weeks ago. Associated symptoms include chills, a fever, headaches, nasal congestion, postnasal drip and rhinorrhea. Pertinent negatives include no chest pain, ear congestion, ear pain, heartburn, hemoptysis, myalgias, rash, shortness of breath, weight loss or wheezing. Nothing aggravates the symptoms. Her past medical history is significant for environmental allergies.   She does report a mildly sore throat and a runny nose, with a sensation of congestion in the posterior pharyngeal area.  Taking Tylenol and mucinex.  She also notes that her husband is currently exhibiting a cough.  In addition to her respiratory symptoms, she expresses concern about right heel pain, exacerbated with prolonged walking. This pain has been a consistent issue for approximately one month, stable. No hx of trauma. She has not noted edema or erythema. She has not tried OTC treatments.  Review of Systems  Constitutional:   Positive for chills and fever. Negative for weight loss.  HENT:  Positive for postnasal drip and rhinorrhea. Negative for ear pain and facial swelling.   Respiratory:  Positive for cough. Negative for hemoptysis, shortness of breath and wheezing.   Cardiovascular:  Negative for chest pain.  Gastrointestinal:  Negative for abdominal pain, heartburn, nausea and vomiting.  Musculoskeletal:  Negative for myalgias.  Skin:  Negative for rash.  Allergic/Immunologic: Positive for environmental allergies.  Neurological:  Positive for headaches. Negative for syncope and facial asymmetry.  See other pertinent positives and negatives in HPI.  Current Outpatient Medications on File Prior to Visit  Medication Sig Dispense Refill   Calcium Carb-Cholecalciferol (CALCIUM/VITAMIN D) 600-400 MG-UNIT TABS Take 1 tablet by mouth at bedtime.     fexofenadine (ALLEGRA) 180 MG tablet Take 180 mg by mouth at bedtime.     inFLIXimab-axxq (AVSOLA) 100 MG injection Inject 1 mg into the vein once a week.     metFORMIN (GLUCOPHAGE) 500 MG tablet 1 full tab daily with breakfast and 1/2 tab at lunch 45 tablet 0   Prenatal Vit-Fe Fumarate-FA (PRENATAL MULTIVITAMIN) TABS tablet Take 1 tablet by mouth at bedtime.     Vitamin D, Ergocalciferol, (DRISDOL) 1.25 MG (50000 UNIT) CAPS capsule Take 1 capsule (50,000 Units total) by mouth every 7 (seven) days. 4 capsule 0   Current Facility-Administered Medications on File Prior to Visit  Medication Dose Route Frequency Provider Last Rate Last Admin   acetaminophen (TYLENOL) tablet 650 mg  650 mg Oral Once Mansouraty, Netty Starring., MD       diphenhydrAMINE (BENADRYL) capsule 25 mg  25 mg Oral Once Mansouraty, Netty Starring., MD  inFLIXimab-axxq (AVSOLA) 5 mg/kg = 600 mg in sodium chloride 0.9 % 250 mL infusion  5 mg/kg Intravenous Q30 days Mannam, Praveen, MD   Stopped at 05/11/22 1150   methylPREDNISolone sodium succinate (SOLU-MEDROL) 40 mg/mL injection 40 mg  40 mg Intravenous  Once Mansouraty, Netty Starring., MD       Past Medical History:  Diagnosis Date   Abrasion of upper arm with infection, left, sequela    started antibiotics lef arm oct 4th started antibiotics healing well   Anemia    hx of 2 yrs ago   Chicken pox    Chronic rhinitis    Contact dermatitis and other eczema, due to unspecified cause    Crohn's 11-15-11   no issues in 2 yrs   Fibroids 11-15-11   remains with fibroids   Hernia    Incisional hernia, RLQ 10/02/2011   Infertility, female    Mononucleosis    Obesity    Pilonidal abscess    Rectal abscess    Regional enteritis of small intestine    Vitamin D deficiency    No Known Allergies  Social History   Socioeconomic History   Marital status: Married    Spouse name: [name]   Number of children: Not on file   Years of education: Not on file   Highest education level: Bachelor's degree (e.g., BA, AB, BS)  Occupational History   Occupation: Editor, commissioning  Tobacco Use   Smoking status: Never   Smokeless tobacco: Never  Vaping Use   Vaping Use: Never used  Substance and Sexual Activity   Alcohol use: No   Drug use: No   Sexual activity: Yes    Partners: Male  Other Topics Concern   Not on file  Social History Narrative   married   HH of 2   Occupation: Editor, 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    No pets   Sleep 6-7 hours    BF with 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   Walking exercise   Eats balanced generally   Social Determinants of Health   Financial Resource Strain: Low Risk  (09/11/2022)   Overall Financial Resource Strain (CARDIA)    Difficulty of Paying Living Expenses: Not hard at all  Food Insecurity: No Food Insecurity (09/11/2022)   Hunger Vital Sign    Worried About Running Out of Food in the Last Year: Never true    Ran Out of Food in the Last Year: Never true  Transportation Needs: No Transportation Needs (09/11/2022)   PRAPARE - Administrator, Civil Service (Medical): No    Lack of  Transportation (Non-Medical): No  Physical Activity: Unknown (09/11/2022)   Exercise Vital Sign    Days of Exercise per Week: Patient declined    Minutes of Exercise per Session: Not on file  Stress: No Stress Concern Present (09/11/2022)   [employer] of Occupational Health - Occupational Stress Questionnaire    Feeling of Stress : Only a little  Social Connections: Unknown (09/11/2022)   Social Connection and Isolation Panel [NHANES]    Frequency of Communication with Friends and Family: More than three times a week    Frequency of Social Gatherings with Friends and Family: Patient declined    Attends Religious Services: Patient declined    Database administrator or Organizations: Yes    Attends Banker Meetings: 1 to 4 times per year    Marital Status: Married   Vitals:  09/11/22 1006 09/11/22 1037  BP: 136/80   Pulse: (!) 117 (!) 104  Resp: 12   Temp: 98.5 F (36.9 C)   SpO2: 98%    Body mass index is 50.13 kg/m.  Physical Exam Vitals and nursing note reviewed.  Constitutional:      General: She is not in acute distress.    Appearance: She is well-developed. She is not ill-appearing.  HENT:     Head: Normocephalic and atraumatic.     Right Ear: Tympanic membrane, ear canal and external ear normal.     Left Ear: Tympanic membrane, ear canal and external ear normal.     Nose: Rhinorrhea present.     Right Sinus: No maxillary sinus tenderness or frontal sinus tenderness.     Left Sinus: No maxillary sinus tenderness or frontal sinus tenderness.     Mouth/Throat:     Mouth: Mucous membranes are moist.     Pharynx: Oropharynx is clear.  Eyes:     Conjunctiva/sclera: Conjunctivae normal.  Cardiovascular:     Rate and Rhythm: Regular rhythm. Tachycardia present.     Heart sounds: No murmur heard. Pulmonary:     Effort: Pulmonary effort is normal. Prolonged expiration present. No respiratory distress.     Breath sounds: Normal breath sounds. No stridor.  No wheezing, rhonchi or rales.  Musculoskeletal:     Comments: Right foot: No tenderness upon palpation of heel mainly or plantar fascia into calcaneous. No pain with palpation along planta fascia towards forefoot. Small bunion  Dorsal flexion of first MTP does not elicits pain.  No edema or erythema appreciated on area.  Lymphadenopathy:     Cervical: No cervical adenopathy.  Skin:    General: Skin is warm.     Findings: No erythema or rash.  Neurological:     Mental Status: She is alert and oriented to person, place, and time.  Psychiatric:        Mood and Affect: Mood and affect normal.   ASSESSMENT AND PLAN:  Ms. Gatti was seen for persistent cough and low grade fever.  Respiratory tract infection Because symptoms are recurrent and getting worse + hx of immunosuppressive state, recommend abx treatment. We discussed some side effects, Albuterol inh 2 puff every 6 hours for a week then as needed for wheezing or shortness of breath.  Continue Plain Mucinex. Instructed about warning signs.  -     Azithromycin; 2 tabs day one then 1 tab daily for 4 days.  Dispense: 6 tablet; Refill: 0 -     Albuterol Sulfate HFA; Inhale 2 puffs into the lungs every 6 (six) hours as needed for wheezing or shortness of breath.  Dispense: 8 g; Refill: 0  Cough, persistent CXR ordered today. Benzonatate and plain Mucinex for cough management.  -     DG Chest 2 View; Future -     Benzonatate; Take 2 capsules (200 mg total) by mouth 2 (two) times daily as needed for up to 10 days for cough.  Dispense: 30 capsule; Refill: 0  Pain of right heel We discussed possible causes. ? Plantar fascitis, although pain was not reproducible during examination. Recommend stretching exercises, comfortable shoes,inserts,and night foot splint. If persistent, she can arrange appt wit podiatrist.  Return if symptoms worsen or fail to improve.  Mattilyn Crites G. [place], MD  [facility] [place] office.

## 2022-09-17 ENCOUNTER — Other Ambulatory Visit: Payer: BC Managed Care – PPO

## 2022-09-17 ENCOUNTER — Other Ambulatory Visit (INDEPENDENT_AMBULATORY_CARE_PROVIDER_SITE_OTHER): Payer: Self-pay | Admitting: Adult Health

## 2022-09-17 DIAGNOSIS — K50918 Crohn's disease, unspecified, with other complication: Secondary | ICD-10-CM

## 2022-09-17 DIAGNOSIS — R7303 Prediabetes: Secondary | ICD-10-CM

## 2022-09-18 ENCOUNTER — Ambulatory Visit (INDEPENDENT_AMBULATORY_CARE_PROVIDER_SITE_OTHER): Payer: BC Managed Care – PPO | Admitting: Adult Health

## 2022-09-18 ENCOUNTER — Encounter (INDEPENDENT_AMBULATORY_CARE_PROVIDER_SITE_OTHER): Payer: Self-pay | Admitting: Adult Health

## 2022-09-18 ENCOUNTER — Ambulatory Visit: Payer: BC Managed Care – PPO | Admitting: Gastroenterology

## 2022-09-18 ENCOUNTER — Other Ambulatory Visit (INDEPENDENT_AMBULATORY_CARE_PROVIDER_SITE_OTHER): Payer: BC Managed Care – PPO

## 2022-09-18 ENCOUNTER — Encounter: Payer: Self-pay | Admitting: Gastroenterology

## 2022-09-18 VITALS — BP 110/74 | HR 94 | Temp 97.7°F | Ht 63.0 in | Wt 275.0 lb

## 2022-09-18 VITALS — BP 120/70 | HR 91 | Ht 62.0 in | Wt 281.0 lb

## 2022-09-18 DIAGNOSIS — Z6841 Body Mass Index (BMI) 40.0 and over, adult: Secondary | ICD-10-CM | POA: Diagnosis not present

## 2022-09-18 DIAGNOSIS — D84821 Immunodeficiency due to drugs: Secondary | ICD-10-CM | POA: Diagnosis not present

## 2022-09-18 DIAGNOSIS — E559 Vitamin D deficiency, unspecified: Secondary | ICD-10-CM

## 2022-09-18 DIAGNOSIS — Z8639 Personal history of other endocrine, nutritional and metabolic disease: Secondary | ICD-10-CM

## 2022-09-18 DIAGNOSIS — Z79899 Other long term (current) drug therapy: Secondary | ICD-10-CM | POA: Diagnosis not present

## 2022-09-18 DIAGNOSIS — K5080 Crohn's disease of both small and large intestine without complications: Secondary | ICD-10-CM | POA: Diagnosis not present

## 2022-09-18 DIAGNOSIS — K50918 Crohn's disease, unspecified, with other complication: Secondary | ICD-10-CM

## 2022-09-18 DIAGNOSIS — R7303 Prediabetes: Secondary | ICD-10-CM | POA: Diagnosis not present

## 2022-09-18 DIAGNOSIS — E669 Obesity, unspecified: Secondary | ICD-10-CM

## 2022-09-18 LAB — COMPREHENSIVE METABOLIC PANEL
ALT: 10 U/L (ref 0–35)
AST: 14 U/L (ref 0–37)
Albumin: 3.5 g/dL (ref 3.5–5.2)
Alkaline Phosphatase: 70 U/L (ref 39–117)
BUN: 13 mg/dL (ref 6–23)
CO2: 31 mEq/L (ref 19–32)
Calcium: 9 mg/dL (ref 8.4–10.5)
Chloride: 99 mEq/L (ref 96–112)
Creatinine, Ser: 0.73 mg/dL (ref 0.40–1.20)
GFR: 103.58 mL/min (ref >60.00)
Glucose, Bld: 112 mg/dL — ABNORMAL HIGH (ref 70–99)
Potassium: 3.7 mEq/L (ref 3.5–5.1)
Sodium: 137 mEq/L (ref 135–145)
Total Bilirubin: 0.2 mg/dL (ref 0.2–1.2)
Total Protein: 7.5 g/dL (ref 6.0–8.3)

## 2022-09-18 LAB — CBC
HCT: 37.3 % (ref 36.0–46.0)
Hemoglobin: 11.8 g/dL — ABNORMAL LOW (ref 12.0–15.0)
MCHC: 31.6 g/dL (ref 30.0–36.0)
MCV: 84.1 fl (ref 78.0–100.0)
Platelets: 410 10*3/uL — ABNORMAL HIGH (ref 150.0–400.0)
RBC: 4.44 Mil/uL (ref 3.87–5.11)
RDW: 16.1 % — ABNORMAL HIGH (ref 11.5–15.5)
WBC: 14.8 10*3/uL — ABNORMAL HIGH (ref 4.0–10.5)

## 2022-09-18 LAB — C-REACTIVE PROTEIN: CRP: 3.1 mg/dL (ref 0.5–20.0)

## 2022-09-18 LAB — SEDIMENTATION RATE: Sed Rate: 86 mm/hr — ABNORMAL HIGH (ref 0–20)

## 2022-09-18 MED ORDER — METFORMIN HCL 500 MG PO TABS
ORAL_TABLET | ORAL | 0 refills | Status: DC
Start: 2022-09-18 — End: 2022-11-07

## 2022-09-18 MED ORDER — VITAMIN D (ERGOCALCIFEROL) 1.25 MG (50000 UNIT) PO CAPS: 50000.0000 [IU] | ORAL_CAPSULE | ORAL | 0 refills | Status: DC

## 2022-09-18 NOTE — Progress Notes (Signed)
WEIGHT SUMMARY AND BIOMETRICS  Vitals Temp: 97.7 F (36.5 C) BP: 110/74 Pulse Rate: 94 SpO2: 96 %   Anthropometric Measurements Height:  (1.6 m) Weight: 275 lb (124.7 kg) BMI (Calculated): 48.73 Weight at Last Visit: 275lb Weight Lost Since Last Visit: 0 Weight Gained Since Last Visit: 0 Starting Weight: 293lb Total Weight Loss (lbs): 58 lb (26.3 kg)   Body Composition  Body Fat %: 50.3 % Fat Mass (lbs): 138.6 lbs Muscle Mass (lbs): 130 lbs Total Body Water (lbs): 104.8 lbs Visceral Fat Rating : 17   Other Clinical Data Fasting: yes Labs: no Today's Visit #: 70 Starting Date: 10/12/17    Chief Complaint:   OBESITY [name] is here to discuss her progress with her obesity treatment plan. She is on the the Category 2 Plan and states she is following her eating plan approximately 65 % of the time.  She states she is not currently exercising.   Interim History:  Since last OV at [facility] on 08/20/2022: She has experienced acute R heel pain and URI- both preventing her from exercising.  She has been able to choose better snacks and increased compliance with lunch dose of Metformin . She reports decreased cravings when she takes Metformin- denies GI upset.  Subjective:   1. Pre-diabetes Lab Results  Component Value Date   HGBA1C 5.6 06/13/2022   HGBA1C 5.8 (H) 12/26/2021   HGBA1C 5.2 03/15/2021   She started packing lunch 1/2 tab Metformin  with lunch and will notice and appreciable decrease in cravings. She is 100% complaint with breakfast Metformin  tablet. She denies GI upset. 06/13/2022 CMP GFR >60 She is agreeable to increasing lunch dose to 1/2 to full tab.  2. Vitamin D deficiency She is on weekly Ergocalciferol- denies N/V/Muscle Weakness. She endorses increased fatigue since recent URI.   Assessment/Plan:   1. Pre-diabetes Refill and INCREASE - metFORMIN (GLUCOPHAGE) 500 MG tablet; 1 full tab daily with breakfast and 1 at  lunch  Dispense: 60 tablet; Refill: 0 Check labs at next OV.  2. Vitamin D deficiency Refill  Ergocalciferol 50,000 IU once week Disp 4 RF 0 Check labs at next OV.  4. Obesity, current BMI 48.73  [name] is currently in the action stage of change. As such, her goal is to continue with weight loss efforts. She has agreed to the Category 2 Plan.   Exercise goals: All adults should avoid inactivity. Some physical activity is better than none, and adults who participate in any amount of physical activity gain some health benefits. For additional and more extensive health benefits, adults should increase their aerobic physical activity to 300 minutes (5 hours) a week of moderate-intensity, or 150 minutes a week of vigorous-intensity aerobic physical activity, or an equivalent combination of moderate- and vigorous-intensity activity. Additional health benefits are gained by engaging in physical activity beyond this amount.  Adults should also include muscle-strengthening activities that involve all major muscle groups on 2 or more days a week.  Behavioral modification strategies: increasing lean protein intake, decreasing simple carbohydrates, increasing vegetables, increasing water intake, no skipping meals, meal planning and cooking strategies, better snacking choices, and planning for success.  [name] has agreed to follow-up with our clinic in 3 weeks. She was informed of the importance of frequent follow-up visits to maximize her success with intensive lifestyle modifications for her multiple health conditions.   Check fasting labs at next OV  Objective:   Blood pressure 110/74, pulse 94, temperature 97.7  F (36.5 C), height  (1.6 m), weight 275 lb (124.7 kg), SpO2 96 %, not currently breastfeeding. Body mass index is 48.71 kg/m.  General: Cooperative, alert, well developed, in no acute distress. HEENT: Conjunctivae and lids unremarkable. Cardiovascular: Regular rhythm.  Lungs: Normal  work of breathing. Neurologic: No focal deficits.   Lab Results  Component Value Date   CREATININE 0.67 06/06/2022   BUN 11 06/06/2022   NA 138 06/06/2022   K 3.9 06/06/2022   CL 103 06/06/2022   CO2 27 06/06/2022   Lab Results  Component Value Date   ALT 10 06/06/2022   AST 14 06/06/2022   ALKPHOS 77 06/06/2022   BILITOT 0.3 06/06/2022   Lab Results  Component Value Date   HGBA1C 5.6 06/13/2022   HGBA1C 5.8 (H) 12/26/2021   HGBA1C 5.2 03/15/2021   HGBA1C 5.1 10/31/2020   HGBA1C 5.5 07/06/2020   Lab Results  Component Value Date   INSULIN 11.5 06/13/2022   INSULIN 5.1 12/26/2021   INSULIN 4.4 03/15/2021   INSULIN 4.2 10/31/2020   INSULIN 3.0 07/06/2020   Lab Results  Component Value Date   TSH 0.89 08/26/2019   Lab Results  Component Value Date   CHOL 166 03/15/2021   HDL 63 03/15/2021   LDLCALC 92 03/15/2021   TRIG 57 03/15/2021   CHOLHDL 2.6 03/15/2021   Lab Results  Component Value Date   VD25OH 39.5 06/13/2022   VD25OH 37.53 02/07/2022   VD25OH 44.3 12/26/2021   Lab Results  Component Value Date   WBC 11.2 (H) 06/06/2022   HGB 12.3 06/06/2022   HCT 37.7 06/06/2022   MCV 85.5 06/06/2022   PLT 365.0 06/06/2022   Lab Results  Component Value Date   IRON 24 (L) 04/12/2022   TIBC 318 04/12/2022   FERRITIN 27 04/12/2022    Attestation Statements:   Reviewed by clinician on day of visit: allergies, medications, problem list, medical history, surgical history, family history, social history, and previous encounter notes.  I have reviewed the above documentation for accuracy and completeness, and I agree with the above. -  Tajia Szeliga d. Isabela Nardelli, NP-C

## 2022-09-18 NOTE — Progress Notes (Unsigned)
GASTROENTEROLOGY OUTPATIENT CLINIC VISIT   Primary Care Provider Panosh, Neta Mends, MD [address] [place] [address] [phone]   Patient Profile: [name] is a 42 y.o. female with a pmh significant for Crohn's ileocolitis disease (status post ileocecectomy) and now on every 4 week Infliximab/Avsola, status post appendectomy, obesity, allergies, iron deficiency.  The patient presents to the [facility] for an evaluation and management of problem(s) noted below:  Problem List No diagnosis found.   History of Present Illness Please see prior notes for full details of HPI.  Interval History Since the patient's last clinic visit, the patient has been doing quite well.  Overall she is feeling better.  She is tolerating every 4 week Avsola infusions.  She is wondering what the long-term plan will be.  She is not having any new or progressive GI symptoms otherwise.  She is not taking any nonsteroidals.  BMs per day -1-3 bowel movements Nocturnal BMs -none Blood -none Mucous -none Tenesmus -none Urgency -none Skin Manifestations -none Eye Manifestations -none Joint Manifestations -none  GI Review of Systems Positive as above Negative for pyrosis, dysphagia, odynophagia, nausea, vomiting, pain, melena, hematochezia  Review of Systems General: Denies fevers/chills/weight loss unintentionally Cardiovascular: Denies chest pain Pulmonary: Denies shortness of breath Gastroenterological: See HPI Genitourinary: Denies darkened urine Hematological: Denies easy bruising/bleeding Dermatological: Denies jaundice Psychological: Mood is stable   Medications Current Outpatient Medications  Medication Sig Dispense Refill   albuterol (VENTOLIN HFA) 108 (90 Base) MCG/ACT inhaler Inhale 2 puffs into the lungs every 6 (six) hours as needed for wheezing or shortness of breath. 8 g 0   benzonatate (TESSALON) 100 MG capsule Take 2 capsules (200 mg  total) by mouth 2 (two) times daily as needed for up to 10 days for cough. 30 capsule 0   Calcium Carb-Cholecalciferol (CALCIUM/VITAMIN D) 600-400 MG-UNIT TABS Take 1 tablet by mouth at bedtime.     fexofenadine (ALLEGRA) 180 MG tablet Take 180 mg by mouth at bedtime.     inFLIXimab-axxq (AVSOLA) 100 MG injection Inject 1 mg into the vein once a week.     metFORMIN (GLUCOPHAGE) 500 MG tablet 1 full tab daily with breakfast and 1 at lunch 60 tablet 0   Prenatal Vit-Fe Fumarate-FA (PRENATAL MULTIVITAMIN) TABS tablet Take 1 tablet by mouth at bedtime.     Vitamin D, Ergocalciferol, (DRISDOL) 1.25 MG (50000 UNIT) CAPS capsule Take 1 capsule (50,000 Units total) by mouth every 7 (seven) days. 4 capsule 0   No current facility-administered medications for this visit.   Facility-Administered Medications Ordered in Other Visits  Medication Dose Route Frequency Provider Last Rate Last Admin   acetaminophen (TYLENOL) tablet 650 mg  650 mg Oral Once Mansouraty, Netty Starring., MD       diphenhydrAMINE (BENADRYL) capsule 25 mg  25 mg Oral Once Mansouraty, Netty Starring., MD       inFLIXimab-axxq (AVSOLA) 5 mg/kg = 600 mg in sodium chloride 0.9 % 250 mL infusion  5 mg/kg Intravenous Q30 days Mannam, Colbert Coyer, MD   Stopped at 05/11/22 1150   methylPREDNISolone sodium succinate (SOLU-MEDROL) 40 mg/mL injection 40 mg  40 mg Intravenous Once Mansouraty, Netty Starring., MD        Allergies No Known Allergies  Histories Past Medical History:  Diagnosis Date   Abrasion of upper arm with infection, left, sequela    started antibiotics lef arm oct 4th started antibiotics healing well   Anemia    hx of 2  yrs ago   Chicken pox    Chronic rhinitis    Contact dermatitis and other eczema, due to unspecified cause    Crohn's 11-15-11   no issues in 2 yrs   Fibroids 11-15-11   remains with fibroids   Hernia    Incisional hernia, RLQ 10/02/2011   Infertility, female    Mononucleosis    Obesity    Pilonidal abscess     Rectal abscess    Regional enteritis of small intestine    Vitamin D deficiency    Past Surgical History:  Procedure Laterality Date   APPENDECTOMY  7/06   BIOPSY  01/25/2022   Procedure: BIOPSY;  Surgeon: Lynann Bologna, MD;  Location: [facility];  Service: Gastroenterology;;   BIOPSY  01/26/2022   Procedure: BIOPSY;  Surgeon: Lynann Bologna, MD;  Location: [facility];  Service: Gastroenterology;;   CESAREAN SECTION N/A 11/30/2020   Procedure: CESAREAN SECTION;  Surgeon: Osborn Coho, MD;  Location: [facility];  Service: Obstetrics;  Laterality: N/A;   COLONOSCOPY WITH PROPOFOL N/A 03/07/2017   Procedure: COLONOSCOPY WITH PROPOFOL;  Surgeon: Rachael Fee, MD;  Location: [facility];  Service: Endoscopy;  Laterality: N/A;   COLONOSCOPY WITH PROPOFOL N/A 01/26/2022   Procedure: COLONOSCOPY WITH PROPOFOL;  Surgeon: Lynann Bologna, MD;  Location: [facility];  Service: Gastroenterology;  Laterality: N/A;   ESOPHAGOGASTRODUODENOSCOPY (EGD) WITH PROPOFOL N/A 01/25/2022   Procedure: ESOPHAGOGASTRODUODENOSCOPY (EGD) WITH PROPOFOL;  Surgeon: Lynann Bologna, MD;  Location: [facility];  Service: Gastroenterology;  Laterality: N/A;   EYE SURGERY  11-15-11   2003-multiple stys   ileocecal resection  1/11   crohns disease with fisula/stricture. Dr. Bertram Savin   INCISIONAL HERNIA REPAIR N/A 12/24/2018   Procedure: OPEN REPAIR VENTRAL INCISIONAL HERNIA WITH MESH PATCH AND PRIMARY REPAIR OF LEFT SPIGELIAN HERNIA;  Surgeon: Darnell Level, MD;  Location: [facility];  Service: General;  Laterality: N/A;   KNEE ARTHROSCOPY  99, 02   Bil.   MYOMECTOMY N/A 11/10/2014   Procedure: MYOMECTOMY;  Surgeon: Osborn Coho, MD;  Location: [facility];  Service: Gynecology;  Laterality: N/A;   PILONIDAL CYST EXCISION  2011, 2012   I&Ds   VENTRAL HERNIA REPAIR  11/27/2011   Procedure: LAPAROSCOPIC VENTRAL HERNIA;  Surgeon: Ardeth Sportsman, MD;  Location: [facility];  Service: General;  Laterality: N/A;  Laparoscopic Exploration &  Repair of Hernia in Abdomen   WISDOM TOOTH EXTRACTION     Social History   Socioeconomic History   Marital status: Married    Spouse name: Bryonna Sundby   Number of children: Not on file   Years of education: Not on file   Highest education level: Bachelor's degree (e.g., BA, AB, BS)  Occupational History   Occupation: Editor, commissioning  Tobacco Use   Smoking status: Never   Smokeless tobacco: Never  Vaping Use   Vaping Use: Never used  Substance and Sexual Activity   Alcohol use: No   Drug use: No   Sexual activity: Yes    Partners: Male  Other Topics Concern   Not on file  Social History Narrative   married   HH of 2   Occupation: Editor, 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    No pets   Sleep 6-7 hours    BF with 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   Walking exercise   Eats balanced generally   Social Determinants of Health   Financial Resource Strain: Low Risk  (09/11/2022)   Overall Financial  Resource Strain (CARDIA)    Difficulty of Paying Living Expenses: Not hard at all  Food Insecurity: No Food Insecurity (09/11/2022)   Hunger Vital Sign    Worried About Running Out of Food in the Last Year: Never true    Ran Out of Food in the Last Year: Never true  Transportation Needs: No Transportation Needs (09/11/2022)   PRAPARE - Administrator, Civil Service (Medical): No    Lack of Transportation (Non-Medical): No  Physical Activity: Unknown (09/11/2022)   Exercise Vital Sign    Days of Exercise per Week: Patient declined    Minutes of Exercise per Session: Not on file  Stress: No Stress Concern Present (09/11/2022)   [employer] of Occupational Health - Occupational Stress Questionnaire    Feeling of Stress : Only a little  Social Connections: Unknown (09/11/2022)   Social Connection and Isolation Panel [NHANES]    Frequency of Communication with Friends and Family: More than three times a week    Frequency of Social Gatherings with Friends and Family: Patient  declined    Attends Religious Services: Patient declined    Database administrator or Organizations: Yes    Attends Engineer, structural: 1 to 4 times per year    Marital Status: Married  Catering manager Violence: Not on file   Family History  Problem Relation Age of Onset   Aneurysm Mother        brain   Heart disease Mother 55       heart anyrism   Healthy Father        fairly   Diabetes Sister        half sister   Bipolar disorder Sister        half sister   Multiple sclerosis Sister    Colon cancer Maternal Grandmother    Stomach cancer Paternal Grandfather    Esophageal cancer Neg Hx    Inflammatory bowel disease Neg Hx    Liver disease Neg Hx    Pancreatic cancer Neg Hx    Rectal cancer Neg Hx    I have reviewed her medical, social, and family history in detail and updated the electronic medical record as necessary.    PHYSICAL EXAMINATION  There were no vitals taken for this visit. Wt Readings from Last 3 Encounters:  09/18/22 275 lb (124.7 kg)  09/11/22 278 lb 8 oz (126.3 kg)  08/31/22 282 lb 12.8 oz (128.3 kg)  GEN: NAD, appears stated age, doesn't appear chronically ill PSYCH: Cooperative, without pressured speech EYE: Conjunctivae pink, sclerae anicteric ENT: MMM, without oral ulcers CV: Nontachycardic RESP: No audible wheezing GI: NABS, soft, rounded, obese, NTD, without rebound or guarding MSK/EXT: No significant lower extremity edema SKIN: No jaundice NEURO:  Alert & Oriented x 3, no focal deficits   REVIEW OF DATA  I reviewed the following data at the time of this encounter:  GI Procedures and Studies  No new studies to review  Laboratory Studies  Reviewed those in epic  Imaging Studies  No new studies to review   ASSESSMENT  Ms. Abernathy is a 42 y.o. female with a pmh significant for Crohn's ileocolitis disease (status post ileocecectomy) and now on every 4 week Infliximab/Avsola, status post appendectomy, obesity, allergies, iron  deficiency.  The patient is seen today for evaluation and management of:  No diagnosis found.  The patient is clinically and hemodynamically stable at this time.  She seems to be in a clinical  remission currently and her last fecal calprotectin would suggest things are much improved.  Though her endoscopic appearance earlier this year was certainly concerning.  I think she is tolerating the every 4 week Avsola infusions based on her last therapeutic drug monitoring.  I would like her to have her next infusion in December and then in January before her infusion, she should have therapeutic drug monitoring performed.  Depending on the values there, we will want to consider continuing her every 4 week dosing versus if she is doing really well considering every 6 week dosing.  I think it would be reasonable to also consider low-dose immunomodulation and so we will see what her labs look like for [name] to determine those next steps.  She should get labs at least every other infusion to see how she is doing and what her inflammatory markers are doing.  We will consider the role of a repeat colonoscopic evaluation in 2024 pending her overall improvement maintaining.  All patient questions were answered to the best of my ability, and the patient agrees to the aforementioned plan of action with follow-up as indicated.   PLAN  Continue Avsola 10 mg/kg every 4 week dosing Laboratories to be obtained prior to your next infusion as outlined below Before January 20 24 infusion, we will want therapeutic drug monitoring and you will come into our lab to have this performed Would like CBC/CMP/ESR/CRP to be drawn every other infusion Consider immunomodulation pending therapeutic drug monitoring results to decrease antibody development   No orders of the defined types were placed in this encounter.   New Prescriptions   No medications on file   Modified Medications   No medications on file    Planned Follow  Up No follow-ups on file.   Total Time in Face-to-Face and in Coordination of Care for patient including independent/personal interpretation/review of prior testing, medical history, examination, medication adjustment, communicating results with the patient directly, and documentation within the EHR is 25 minutes.   Corliss Parish, MD [facility] Office # [phone]

## 2022-09-18 NOTE — Patient Instructions (Signed)
Your provider has requested that you go to the basement level for lab work before leaving today. Press "B" on the elevator. The lab is located at the first door on the left as you exit the elevator.  _______________________________________________________  If your blood pressure at your visit was 140/90 or greater, please contact your primary care physician to follow up on this.  _______________________________________________________  If you are age 40 or older, your body mass index should be between 23-30. Your Body mass index is 51.4 kg/m. If this is out of the aforementioned range listed, please consider follow up with your Primary Care Provider.  If you are age 44 or younger, your body mass index should be between 19-25. Your Body mass index is 51.4 kg/m. If this is out of the aformentioned range listed, please consider follow up with your Primary Care Provider.   ________________________________________________________  The [facility] providers would like to encourage you to use [facility] to communicate with providers for non-urgent requests or questions.  Due to long hold times on the telephone, sending your provider a message by [facility] may be a faster and more efficient way to get a response.  Please allow 48 business hours for a response.  Please remember that this is for non-urgent requests.  _______________________________________________________  Thank you for choosing me and [facility].  Dr. Meridee Score

## 2022-09-19 ENCOUNTER — Other Ambulatory Visit: Payer: Self-pay

## 2022-09-19 ENCOUNTER — Other Ambulatory Visit (INDEPENDENT_AMBULATORY_CARE_PROVIDER_SITE_OTHER): Payer: BC Managed Care – PPO

## 2022-09-19 ENCOUNTER — Encounter: Payer: Self-pay | Admitting: Gastroenterology

## 2022-09-19 DIAGNOSIS — D649 Anemia, unspecified: Secondary | ICD-10-CM

## 2022-09-19 DIAGNOSIS — K50918 Crohn's disease, unspecified, with other complication: Secondary | ICD-10-CM | POA: Diagnosis not present

## 2022-09-19 DIAGNOSIS — Z8639 Personal history of other endocrine, nutritional and metabolic disease: Secondary | ICD-10-CM | POA: Insufficient documentation

## 2022-09-19 LAB — IBC + FERRITIN
Ferritin: 7.8 ng/mL — ABNORMAL LOW (ref 10.0–291.0)
Iron: 42 ug/dL (ref 42–145)
Saturation Ratios: 13.9 % — ABNORMAL LOW (ref 20.0–50.0)
TIBC: 302.4 ug/dL (ref 250.0–450.0)
Transferrin: 216 mg/dL (ref 212.0–360.0)

## 2022-09-19 LAB — FOLATE: Folate: >23.5 ng/mL (ref >5.9)

## 2022-09-19 LAB — VITAMIN B12: Vitamin B-12: 544 pg/mL (ref 211–911)

## 2022-09-20 ENCOUNTER — Other Ambulatory Visit: Payer: Self-pay

## 2022-09-20 DIAGNOSIS — K50918 Crohn's disease, unspecified, with other complication: Secondary | ICD-10-CM

## 2022-09-20 LAB — QUANTIFERON-TB GOLD PLUS
Mitogen-NIL: >10 IU/mL
NIL: 0.01 IU/mL
QuantiFERON-TB Gold Plus: NEGATIVE
TB1-NIL: 0 IU/mL
TB2-NIL: 0 IU/mL

## 2022-09-20 MED ORDER — FERROUS GLUCONATE 324 (38 FE) MG PO TABS: 324.0000 mg | ORAL_TABLET | Freq: Every day | ORAL | 3 refills | Status: DC

## 2022-09-21 LAB — CALPROTECTIN, FECAL: Calprotectin, Fecal: 61 ug/g (ref 0–120)

## 2022-09-26 ENCOUNTER — Other Ambulatory Visit: Payer: Self-pay

## 2022-09-26 DIAGNOSIS — Z79899 Other long term (current) drug therapy: Secondary | ICD-10-CM

## 2022-09-26 DIAGNOSIS — K50918 Crohn's disease, unspecified, with other complication: Secondary | ICD-10-CM

## 2022-09-26 DIAGNOSIS — Z8639 Personal history of other endocrine, nutritional and metabolic disease: Secondary | ICD-10-CM

## 2022-09-28 ENCOUNTER — Ambulatory Visit (INDEPENDENT_AMBULATORY_CARE_PROVIDER_SITE_OTHER): Payer: BC Managed Care – PPO

## 2022-09-28 VITALS — BP 110/70 | HR 80 | Temp 97.3°F | Resp 18 | Ht 62.0 in | Wt 283.2 lb

## 2022-09-28 DIAGNOSIS — Z8719 Personal history of other diseases of the digestive system: Secondary | ICD-10-CM | POA: Diagnosis not present

## 2022-09-28 DIAGNOSIS — K5000 Crohn's disease of small intestine without complications: Secondary | ICD-10-CM | POA: Diagnosis not present

## 2022-09-28 MED ORDER — SODIUM CHLORIDE 0.9 % IV SOLN
5.0000 mg/kg | INTRAVENOUS | Status: DC
  Administered 2022-09-28: 600 mg via INTRAVENOUS
  Filled 2022-09-28: qty 60

## 2022-09-28 MED ORDER — METHYLPREDNISOLONE SODIUM SUCC 40 MG IJ SOLR: 40.0000 mg | Freq: Once | INTRAMUSCULAR | Status: DC

## 2022-09-28 MED ORDER — DIPHENHYDRAMINE HCL 25 MG PO CAPS: 25.0000 mg | ORAL_CAPSULE | Freq: Once | ORAL | Status: DC

## 2022-09-28 MED ORDER — ACETAMINOPHEN 325 MG PO TABS: 650.0000 mg | ORAL_TABLET | Freq: Once | ORAL | Status: DC

## 2022-09-28 NOTE — Progress Notes (Signed)
Diagnosis: Crohn's Disease  Provider:  Chilton Greathouse MD  Procedure: IV Infusion  IV Type: Peripheral, IV Location: L Antecubital  Avsola (infliximab-axxq), Dose: 600 mg  Infusion Start Time: 0957  Infusion Stop Time: 1213  Post Infusion IV Care: Peripheral IV Discontinued  Discharge: Condition: Good, Destination: Home . AVS Declined  Performed by:  Marlow Baars [place], RN

## 2022-10-02 ENCOUNTER — Other Ambulatory Visit: Payer: Self-pay | Admitting: Obstetrics and Gynecology

## 2022-10-11 ENCOUNTER — Telehealth: Payer: Self-pay | Admitting: Gastroenterology

## 2022-10-11 ENCOUNTER — Ambulatory Visit: Payer: BC Managed Care – PPO | Admitting: Hematology and Oncology

## 2022-10-11 NOTE — Telephone Encounter (Signed)
The pt has been advised that she will have her labs checked at her next infusion in June.  The pt has been advised of the information and verbalized understanding.

## 2022-10-11 NOTE — Telephone Encounter (Signed)
Inbound call from patient, states she would like Patty to put an order in for labs. Patient states she has a history of iron deficiency and did not have her iron levels checked at her last infusion. Patient states she can feel her iron dropping and would like to get levels checked.

## 2022-10-15 ENCOUNTER — Other Ambulatory Visit (INDEPENDENT_AMBULATORY_CARE_PROVIDER_SITE_OTHER): Payer: Self-pay | Admitting: Adult Health

## 2022-10-15 DIAGNOSIS — R7303 Prediabetes: Secondary | ICD-10-CM

## 2022-10-16 ENCOUNTER — Inpatient Hospital Stay: Payer: BC Managed Care – PPO | Attending: Hematology and Oncology | Admitting: Hematology and Oncology

## 2022-10-16 VITALS — BP 122/65 | HR 85 | Temp 97.9°F | Resp 19 | Ht 62.0 in | Wt 286.1 lb

## 2022-10-16 DIAGNOSIS — D75839 Thrombocytosis, unspecified: Secondary | ICD-10-CM | POA: Insufficient documentation

## 2022-10-16 DIAGNOSIS — Z800 Family history of malignant neoplasm of digestive organs: Secondary | ICD-10-CM | POA: Insufficient documentation

## 2022-10-16 DIAGNOSIS — D649 Anemia, unspecified: Secondary | ICD-10-CM | POA: Insufficient documentation

## 2022-10-16 DIAGNOSIS — D509 Iron deficiency anemia, unspecified: Secondary | ICD-10-CM | POA: Diagnosis not present

## 2022-10-16 NOTE — Progress Notes (Signed)
[facility] CONSULT NOTE  Patient Care Team: Panosh, Neta Mends, MD as PCP - General Christella Hartigan Melton Alar, MD (Gastroenterology) Osborn Coho, MD (Obstetrics and Gynecology) Donnetta Hail, MD (Rheumatology) [place] Nickel, DO as Consulting Physician (Bariatrics) Lynann Bologna, MD as Consulting Physician (Gastroenterology) Rachel Moulds, [name]  CHIEF COMPLAINTS/PURPOSE OF CONSULTATION:  Anemia  ASSESSMENT & PLAN:   This is a very pleasant 42 year old female patient with Crohn's disease, severe anemia with multiple ER visits and hospitalizations recently, currently on infliximab for the past several years with great response referred to hematology initially for recommendations regarding severe anemia.  She received IV iron, tolerated it very well, feels remarkably improved.   She is now here for follow up. She denies any complaints at all except for fatigue. No more hematochezia or melena or exacerbation of Crohn's disease.  She is now on monthly infliximab.  On physical exam, no major findings except for very mild ankle swelling bilaterally.  CBC done most recently showed mild anemia with hemoglobin of 11.8 g/dL, low ferritin at 7.  She is already prescribed oral iron.  I think this is appropriate.  She should continue oral iron and return to clinic in 3 months with repeat labs or sooner as needed.  I do not believe there is any indication for IV iron or packed red blood cells at this time.  She is agreeable to this plan.   HISTORY OF PRESENTING ILLNESS:  [name] 42 y.o. female is here because of anemia and thrombocytosis.  This is a 42 year old with Crohn's disease status post colonic resection and currently on infliximab who was recently seen in the emergency room with syncopal episode was found to have Hemoccult positive and hemoglobin of 6.9, significant drop from hemoglobin of 12.1.  She was admitted to the hospital  with GI bleeding and underwent upper GI endoscopy that was essentially negative and showed a small antral erosion.  She also underwent colonoscopy which showed multiple anastomotic ulcers with signs of Crohn's recurrence.  She was then given packed red blood cells and was discharged to also follow-up with hematology.  She is here for a follow up. She denies any new health complaints except for some mild fatigue.  No shortness of breath, dizziness, palpitations or pica She denies any blood per stool or melena.  Her menstrual cycles are not very heavy, she has 1 or 2 days of heavy menstruation followed by light bleeding which is her normal.  Rest of the pertinent 10 point ROS reviewed and negative.  REVIEW OF SYSTEMS:   Constitutional: Denies fevers, chills or abnormal night sweats Eyes: Denies blurriness of vision, double vision or watery eyes Ears, nose, mouth, throat, and face: Denies mucositis or sore throat Respiratory: Denies cough, dyspnea or wheezes Cardiovascular: Denies palpitation, chest discomfort or lower extremity swelling Gastrointestinal:  Denies nausea, heartburn or change in bowel habits Skin: Denies abnormal skin rashes Lymphatics: Denies new lymphadenopathy or easy bruising Neurological:Denies numbness, tingling or new weaknesses Behavioral/Psych: Mood is stable, no new changes  All other systems were reviewed with the patient and are negative.  MEDICAL HISTORY:  Past Medical History:  Diagnosis Date   Abrasion of upper arm with infection, left, sequela    started antibiotics lef arm oct 4th started antibiotics healing well   Anemia    hx of 2 yrs ago   Chicken pox    Chronic rhinitis    Contact dermatitis and other eczema,  due to unspecified cause    Crohn's 11-15-11   no issues in 2 yrs   Fibroids 11-15-11   remains with fibroids   Hernia    Incisional hernia, RLQ 10/02/2011   Infertility, female    Mononucleosis    Obesity    Pilonidal abscess    Rectal abscess     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HCC)    Vitamin D deficiency     SURGICAL HISTORY: Past Surgical History:  Procedure Laterality Date   APPENDECTOMY  7/06   BIOPSY  01/25/2022   Procedure: BIOPSY;  Surgeon: Lynann Bologna, MD;  Location: [facility];  Service: Gastroenterology;;   BIOPSY  01/26/2022   Procedure: BIOPSY;  Surgeon: Lynann Bologna, MD;  Location: [facility];  Service: Gastroenterology;;   CESAREAN SECTION N/A 11/30/2020   Procedure: CESAREAN SECTION;  Surgeon: Osborn Coho, MD;  Location: [facility]S;  Service: Obstetrics;  Laterality: N/A;   COLONOSCOPY WITH PROPOFOL N/A 03/07/2017   Procedure: COLONOSCOPY WITH PROPOFOL;  Surgeon: Rachael Fee, MD;  Location: [facility];  Service: Endoscopy;  Laterality: N/A;   COLONOSCOPY WITH PROPOFOL N/A 01/26/2022   Procedure: COLONOSCOPY WITH PROPOFOL;  Surgeon: Lynann Bologna, MD;  Location: [facility];  Service: Gastroenterology;  Laterality: N/A;   ESOPHAGOGASTRODUODENOSCOPY (EGD) WITH PROPOFOL N/A 01/25/2022   Procedure: ESOPHAGOGASTRODUODENOSCOPY (EGD) WITH PROPOFOL;  Surgeon: Lynann Bologna, MD;  Location: [facility];  Service: Gastroenterology;  Laterality: N/A;   EYE SURGERY  11-15-11   2003-multiple stys   ileocecal resection  1/11   crohns disease with fisula/stricture. Dr. Bertram Savin   INCISIONAL HERNIA REPAIR N/A 12/24/2018   Procedure: OPEN REPAIR VENTRAL INCISIONAL HERNIA WITH MESH PATCH AND PRIMARY REPAIR OF LEFT SPIGELIAN HERNIA;  Surgeon: Darnell Level, MD;  Location: [facility]S;  Service: General;  Laterality: N/A;   KNEE ARTHROSCOPY  99, 02   Bil.   MYOMECTOMY N/A 11/10/2014   Procedure: MYOMECTOMY;  Surgeon: Osborn Coho, MD;  Location: [facility];  Service: Gynecology;  Laterality: N/A;   PILONIDAL CYST EXCISION  2011, 2012   I&Ds   VENTRAL HERNIA REPAIR  11/27/2011   Procedure: LAPAROSCOPIC VENTRAL HERNIA;  Surgeon: Ardeth Sportsman, MD;  Location: [facility];  Service: General;  Laterality: N/A;  Laparoscopic  Exploration & Repair of Hernia in Abdomen   WISDOM TOOTH EXTRACTION      SOCIAL HISTORY: Social History   Socioeconomic History   Marital status: Married    Spouse name: Selima Guthier   Number of children: Not on file   Years of education: Not on file   Highest education level: Bachelor's degree (e.g., BA, AB, BS)  Occupational History   Occupation: Editor, commissioning  Tobacco Use   Smoking status: Never   Smokeless tobacco: Never  Vaping Use   Vaping Use: Never used  Substance and Sexual Activity   Alcohol use: No   Drug use: No   Sexual activity: Yes    Partners: Male  Other Topics Concern   Not on file  Social History Narrative   married   HH of 2   Occupation: Editor, 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    No pets   Sleep 6-7 hours    BF with 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   Walking exercise   Eats balanced generally   Social Determinants of Health   Financial Resource Strain: Low Risk  (09/11/2022)   Overall Financial Resource Strain (CARDIA)    Difficulty of Paying Living Expenses: Not  hard at all  Food Insecurity: No Food Insecurity (09/11/2022)   Hunger Vital Sign    Worried About Running Out of Food in the Last Year: Never true    Ran Out of Food in the Last Year: Never true  Transportation Needs: No Transportation Needs (09/11/2022)   PRAPARE - Administrator, Civil Service (Medical): No    Lack of Transportation (Non-Medical): No  Physical Activity: Unknown (09/11/2022)   Exercise Vital Sign    Days of Exercise per Week: Patient declined    Minutes of Exercise per Session: Not on file  Stress: No Stress Concern Present (09/11/2022)   [employer] of Occupational Health - Occupational Stress Questionnaire    Feeling of Stress : Only a little  Social Connections: Unknown (09/11/2022)   Social Connection and Isolation Panel [NHANES]    Frequency of Communication with Friends and Family: More than three times a week    Frequency of Social Gatherings  with Friends and Family: Patient declined    Attends Religious Services: Patient declined    Database administrator or Organizations: Yes    Attends Engineer, structural: 1 to 4 times per year    Marital Status: Married  Catering manager Violence: Not on file    FAMILY HISTORY: Family History  Problem Relation Age of Onset   Aneurysm Mother        brain   Heart disease Mother 2       heart anyrism   Healthy Father        fairly   Diabetes Sister        half sister   Bipolar disorder Sister        half sister   Multiple sclerosis Sister    Colon cancer Maternal Grandmother    Stomach cancer Paternal Grandfather    Esophageal cancer Neg Hx    Inflammatory bowel disease Neg Hx    Liver disease Neg Hx    Pancreatic cancer Neg Hx    Rectal cancer Neg Hx     ALLERGIES:  has No Known Allergies.  MEDICATIONS:  Current Outpatient Medications  Medication Sig Dispense Refill   Calcium Carb-Cholecalciferol (CALCIUM/VITAMIN D) 600-400 MG-UNIT TABS Take 1 tablet by mouth at bedtime.     ferrous gluconate (FERGON) 324 MG tablet Take 1 tablet (324 mg total) by mouth daily with breakfast. 30 tablet 3   fexofenadine (ALLEGRA) 180 MG tablet Take 180 mg by mouth at bedtime.     inFLIXimab-axxq (AVSOLA) 100 MG injection Inject 1 mg into the vein once a week.     metFORMIN (GLUCOPHAGE) 500 MG tablet 1 full tab daily with breakfast and 1 at lunch 60 tablet 0   Prenatal Vit-Fe Fumarate-FA (PRENATAL MULTIVITAMIN) TABS tablet Take 1 tablet by mouth at bedtime.     Vitamin D, Ergocalciferol, (DRISDOL) 1.25 MG (50000 UNIT) CAPS capsule Take 1 capsule (50,000 Units total) by mouth every 7 (seven) days. 4 capsule 0   No current facility-administered medications for this visit.   Facility-Administered Medications Ordered in Other Visits  Medication Dose Route Frequency Provider Last Rate Last Admin   acetaminophen (TYLENOL) tablet 650 mg  650 mg Oral Once Mansouraty, Netty Starring., MD        diphenhydrAMINE (BENADRYL) capsule 25 mg  25 mg Oral Once Mansouraty, Netty Starring., MD       inFLIXimab-axxq (AVSOLA) 5 mg/kg = 600 mg in sodium chloride 0.9 % 250 mL infusion  5 mg/kg Intravenous  Q30 days Mannam, Colbert Coyer, MD   Stopped at 05/11/22 1150   methylPREDNISolone sodium succinate (SOLU-MEDROL) 40 mg/mL injection 40 mg  40 mg Intravenous Once Mansouraty, Netty Starring., MD         PHYSICAL EXAMINATION: ECOG PERFORMANCE STATUS: 0 - Asymptomatic  There were no vitals filed for this visit.  There were no vitals filed for this visit.   Physical Exam Constitutional:      Appearance: Normal appearance.  Cardiovascular:     Rate and Rhythm: Normal rate.     Pulses: Normal pulses.  Pulmonary:     Effort: Pulmonary effort is normal.  Musculoskeletal:        General: Normal range of motion.     Cervical back: Normal range of motion. No rigidity.  Lymphadenopathy:     Cervical: No cervical adenopathy.  Skin:    General: Skin is warm and dry.  Neurological:     General: No focal deficit present.     Mental Status: She is alert.       LABORATORY DATA:  I have reviewed the data as listed Lab Results  Component Value Date   WBC 14.8 (H) 09/18/2022   HGB 11.8 (L) 09/18/2022   HCT 37.3 09/18/2022   MCV 84.1 09/18/2022   PLT 410.0 (H) 09/18/2022     Chemistry      Component Value Date/Time   NA 137 09/18/2022 0937   NA 140 12/26/2021 0946   K 3.7 09/18/2022 0937   CL 99 09/18/2022 0937   CO2 31 09/18/2022 0937   BUN 13 09/18/2022 0937   BUN 16 12/26/2021 0946   CREATININE 0.73 09/18/2022 0937      Component Value Date/Time   CALCIUM 9.0 09/18/2022 0937   CALCIUM 7.6 (L) 01/25/2022 0328   ALKPHOS 70 09/18/2022 0937   AST 14 09/18/2022 0937   ALT 10 09/18/2022 0937   BILITOT 0.2 09/18/2022 0937   BILITOT 0.3 12/26/2021 0946       RADIOGRAPHIC STUDIES: I have personally reviewed the radiological images as listed and agreed with the findings in the report. No  results found.  All questions were answered. The patient knows to call the clinic with any problems, questions or concerns. I spent 20 minutes in the care of this patient including H and P, review of records, counseling and coordination of care.     Rachel Moulds, MD 10/16/2022 8:30 AM

## 2022-10-17 ENCOUNTER — Encounter (INDEPENDENT_AMBULATORY_CARE_PROVIDER_SITE_OTHER): Payer: Self-pay | Admitting: Adult Health

## 2022-10-17 ENCOUNTER — Ambulatory Visit (INDEPENDENT_AMBULATORY_CARE_PROVIDER_SITE_OTHER): Payer: BC Managed Care – PPO | Admitting: Adult Health

## 2022-10-17 VITALS — BP 116/80 | HR 96 | Temp 98.2°F | Ht 62.0 in | Wt 282.0 lb

## 2022-10-17 DIAGNOSIS — R5383 Other fatigue: Secondary | ICD-10-CM | POA: Diagnosis not present

## 2022-10-17 DIAGNOSIS — R7303 Prediabetes: Secondary | ICD-10-CM | POA: Diagnosis not present

## 2022-10-17 DIAGNOSIS — E669 Obesity, unspecified: Secondary | ICD-10-CM

## 2022-10-17 DIAGNOSIS — D509 Iron deficiency anemia, unspecified: Secondary | ICD-10-CM

## 2022-10-17 DIAGNOSIS — Z6841 Body Mass Index (BMI) 40.0 and over, adult: Secondary | ICD-10-CM

## 2022-10-17 DIAGNOSIS — R632 Polyphagia: Secondary | ICD-10-CM

## 2022-10-17 DIAGNOSIS — E559 Vitamin D deficiency, unspecified: Secondary | ICD-10-CM

## 2022-10-17 MED ORDER — VITAMIN D (ERGOCALCIFEROL) 1.25 MG (50000 UNIT) PO CAPS: 50000.0000 [IU] | ORAL_CAPSULE | ORAL | 0 refills | Status: DC

## 2022-10-17 NOTE — Progress Notes (Signed)
WEIGHT SUMMARY AND BIOMETRICS  Vitals Temp: 98.2 F (36.8 C) BP: 116/80 Pulse Rate: 96 SpO2: 91 %   Anthropometric Measurements Height: 5\' 2"  (1.575 m) Weight: 282 lb (127.9 kg) BMI (Calculated): 51.57 Starting Weight: 293 lb   Body Composition  Body Fat %: 52.5 % Fat Mass (lbs): 148.2 lbs Muscle Mass (lbs): 127.4 lbs Visceral Fat Rating : 18   Other Clinical Data Fasting: yes Labs: yes Today's Visit #: 43    Chief Complaint:   OBESITY [name] is here to discuss her progress with her obesity treatment plan. She is on the keeping a food journal and adhering to recommended goals of 1300 calories and 100 protein and states she is following her eating plan approximately 65 % of the time. She states she is not currently exercising.   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  She has been drinking Ginger Ale and Root Beer when she eats out.  Discussed nutritional value of Ginger Ale- 190 cal/51 g CHO  Sleep- she will often sleep in recliner due to cough/congestion.  She averages 6 hrs sleep/night.  Hydration-she estimates to drink 80-100 oz water/day  Of Note- She went to [employer] and was fitted with appropriate sized Shon Baton and she reports decreased bilateral foot pain!  Subjective:   1. Iron deficiency anemia, unspecified iron deficiency anemia type 10/16/2022 Oncology OV Note: ASSESSMENT & PLAN:   This is a very pleasant 42 year old female patient with Crohn's disease, severe anemia with multiple ER visits and hospitalizations recently, currently on infliximab for the past several years with great response referred to hematology initially for recommendations regarding severe anemia.  She received IV iron, tolerated it very well, feels remarkably improved.   She is now here for follow  up. She denies any complaints at all except for fatigue. No more hematochezia or melena or exacerbation of Crohn's disease.  She is now on monthly infliximab.  On physical exam, no major findings except for very mild ankle swelling bilaterally.  CBC done most recently showed mild anemia with hemoglobin of 11.8 g/dL, low ferritin at 7.  She is already prescribed oral iron.  I think this is appropriate.  She should continue oral iron and return to clinic in 3 months with repeat labs or sooner as needed.  I do not believe there is any indication for IV iron or packed red blood cells at this time.  She is agreeable to this plan. HPI This is a 42 year old with Crohn's disease status post colonic resection and currently on infliximab who was recently seen in the emergency room with syncopal episode was found to have Hemoccult positive and hemoglobin of 6.9, significant drop from hemoglobin of 12.1.  She was admitted to the hospital with GI bleeding and underwent upper GI endoscopy that was essentially negative and showed a small antral erosion.  She also underwent colonoscopy which showed multiple anastomotic ulcers with signs of Crohn's recurrence.  She was then given packed red blood cells and was discharged to also follow-up with hematology.  She is here for a follow up. She denies any new health complaints except for some mild fatigue.  No shortness of breath, dizziness, palpitations or pica She denies any blood per stool or melena.  Her menstrual cycles are not very heavy, she has 1  or 2 days of heavy menstruation followed by light bleeding which is her normal.  Rest of the pertinent 10 point ROS reviewed and negative.  2. Other fatigue She endorses increase in fatigue over the last several months. Due to worsening allergy sx's she has been sleeping in a recliner.  3. Pre-diabetes Lab Results  Component Value Date   HGBA1C 5.6 06/13/2022   HGBA1C 5.8 (H) 12/26/2021   HGBA1C 5.2 03/15/2021     4.  Vitamin D deficiency  Latest Reference Range & Units 06/13/22 09:14  Vitamin D, 25-Hydroxy 30.0 - 100.0 ng/mL 39.5  She is on weekly Ergocalciferol- denies N/V/Muscle Weakness  5. Polyphagia She endorses increase in sugar/CHO cravings.  She has been drinking Ginger Ale and Root Beer when she eats out.  Discussed nutritional value of Ginger Ale- 190 cal/51 g CHO   Assessment/Plan:   1. Iron deficiency anemia, unspecified iron deficiency anemia type F/u with Oncology as directed  2. Other fatigue Check Labs - TSH + free T4  3. Pre-diabetes Check Labs - Hemoglobin A1c - Insulin, random  4. Vitamin D deficiency Check Labs - VITAMIN D 25 Hydroxy (Vit-D Deficiency, Fractures) Refill Vitamin D, Ergocalciferol, (DRISDOL) 1.25 MG (50000 UNIT) CAPS capsule Take 1 capsule (50,000 Units total) by mouth every 7 (seven) days. Dispense: 4 capsule, Refills: 0 ordered   5. Polyphagia Provided Snack Sheets- encouraged to choose foods from these lists. Increase water Choose Diet Soda options Read Nutritional Labels on Drinks  6. Obesity, current BMI 50.0  [name] is currently in the action stage of change. As such, her goal is to continue with weight loss efforts. She has agreed to keeping a food journal and adhering to recommended goals of 1300 calories and 100 protein.   Exercise goals: All adults should avoid inactivity. Some physical activity is better than none, and adults who participate in any amount of physical activity gain some health benefits.  Behavioral modification strategies: increasing lean protein intake, decreasing simple carbohydrates, increasing vegetables, increasing water intake, decreasing liquid calories, increasing high fiber foods, decreasing eating out, no skipping meals, meal planning and cooking strategies, better snacking choices, and planning for success.  [name] has agreed to follow-up with our clinic in 4 weeks. She was informed of the importance of frequent  follow-up visits to maximize her success with intensive lifestyle modifications for her multiple health conditions.   [name] was informed we would discuss her lab results at her next visit unless there is a critical issue that needs to be addressed sooner. [name] agreed to keep her next visit at the agreed upon time to discuss these results.  Objective:   Blood pressure 116/80, pulse 96, temperature 98.2 F (36.8 C), height 5\' 2"  (1.575 m), weight 282 lb (127.9 kg), SpO2 91 %, not currently breastfeeding. Body mass index is 51.58 kg/m.  General: Cooperative, alert, well developed, in no acute distress. HEENT: Conjunctivae and lids unremarkable. Cardiovascular: Regular rhythm.  Lungs: Normal work of breathing. Neurologic: No focal deficits.   Lab Results  Component Value Date   CREATININE 0.73 09/18/2022   BUN 13 09/18/2022   NA 137 09/18/2022   K 3.7 09/18/2022   CL 99 09/18/2022   CO2 31 09/18/2022   Lab Results  Component Value Date   ALT 10 09/18/2022   AST 14 09/18/2022   ALKPHOS 70 09/18/2022   BILITOT 0.2 09/18/2022   Lab Results  Component Value Date   HGBA1C 5.6 06/13/2022   HGBA1C 5.8 (H) 12/26/2021  HGBA1C 5.2 03/15/2021   HGBA1C 5.1 10/31/2020   HGBA1C 5.5 07/06/2020   Lab Results  Component Value Date   INSULIN 11.5 06/13/2022   INSULIN 5.1 12/26/2021   INSULIN 4.4 03/15/2021   INSULIN 4.2 10/31/2020   INSULIN 3.0 07/06/2020   Lab Results  Component Value Date   TSH 0.89 08/26/2019   Lab Results  Component Value Date   CHOL 166 03/15/2021   HDL 63 03/15/2021   LDLCALC 92 03/15/2021   TRIG 57 03/15/2021   CHOLHDL 2.6 03/15/2021   Lab Results  Component Value Date   VD25OH 39.5 06/13/2022   VD25OH 37.53 02/07/2022   VD25OH 44.3 12/26/2021   Lab Results  Component Value Date   WBC 14.8 (H) 09/18/2022   HGB 11.8 (L) 09/18/2022   HCT 37.3 09/18/2022   MCV 84.1 09/18/2022   PLT 410.0 (H) 09/18/2022   Lab Results  Component Value Date    IRON 42 09/19/2022   TIBC 302.4 09/19/2022   FERRITIN 7.8 (L) 09/19/2022   Attestation Statements:   Reviewed by clinician on day of visit: allergies, medications, problem list, medical history, surgical history, family history, social history, and previous encounter notes.  I have reviewed the above documentation for accuracy and completeness, and I agree with the above. -  Deshawn Witty d. Kenichi Cassada, NP-C

## 2022-10-18 LAB — VITAMIN D 25 HYDROXY (VIT D DEFICIENCY, FRACTURES): Vit D, 25-Hydroxy: 49.4 ng/mL (ref 30.0–100.0)

## 2022-10-18 LAB — TSH+FREE T4
Free T4: 1.19 ng/dL (ref 0.82–1.77)
TSH: 1.05 u[IU]/mL (ref 0.450–4.500)

## 2022-10-18 LAB — HEMOGLOBIN A1C
Est. average glucose Bld gHb Est-mCnc: 117 mg/dL
Hgb A1c MFr Bld: 5.7 % — ABNORMAL HIGH (ref 4.8–5.6)

## 2022-10-18 LAB — INSULIN, RANDOM: INSULIN: 7.2 u[IU]/mL (ref 2.6–24.9)

## 2022-10-26 ENCOUNTER — Ambulatory Visit (INDEPENDENT_AMBULATORY_CARE_PROVIDER_SITE_OTHER): Payer: BC Managed Care – PPO

## 2022-10-26 VITALS — BP 109/69 | HR 88 | Temp 98.0°F | Resp 18 | Ht 62.0 in | Wt 290.4 lb

## 2022-10-26 DIAGNOSIS — Z8719 Personal history of other diseases of the digestive system: Secondary | ICD-10-CM

## 2022-10-26 DIAGNOSIS — K5000 Crohn's disease of small intestine without complications: Secondary | ICD-10-CM | POA: Diagnosis not present

## 2022-10-26 MED ORDER — DIPHENHYDRAMINE HCL 25 MG PO CAPS: 25.0000 mg | ORAL_CAPSULE | Freq: Once | ORAL | Status: DC

## 2022-10-26 MED ORDER — METHYLPREDNISOLONE SODIUM SUCC 40 MG IJ SOLR: 40.0000 mg | Freq: Once | INTRAMUSCULAR | Status: DC

## 2022-10-26 MED ORDER — ACETAMINOPHEN 325 MG PO TABS: 650.0000 mg | ORAL_TABLET | Freq: Once | ORAL | Status: DC

## 2022-10-26 MED ORDER — SODIUM CHLORIDE 0.9 % IV SOLN
5.0000 mg/kg | INTRAVENOUS | Status: DC
  Administered 2022-10-26: 600 mg via INTRAVENOUS
  Filled 2022-10-26: qty 60

## 2022-10-26 NOTE — Progress Notes (Signed)
Diagnosis: Crohn's Disease  Provider:  Chilton Greathouse MD  Procedure: IV Infusion  IV Type: Peripheral, IV Location: L Antecubital  Avsola (infliximab-axxq), Dose: 600 mg  Infusion Start Time: 1012  Infusion Stop Time: 1237  Post Infusion IV Care: Peripheral IV Discontinued  Discharge: Condition: Good, Destination: Home . AVS Declined  Performed by:  Loney Hering, LPN

## 2022-11-07 ENCOUNTER — Encounter (INDEPENDENT_AMBULATORY_CARE_PROVIDER_SITE_OTHER): Payer: Self-pay | Admitting: Adult Health

## 2022-11-07 ENCOUNTER — Ambulatory Visit (INDEPENDENT_AMBULATORY_CARE_PROVIDER_SITE_OTHER): Payer: BC Managed Care – PPO | Admitting: Adult Health

## 2022-11-07 DIAGNOSIS — E559 Vitamin D deficiency, unspecified: Secondary | ICD-10-CM

## 2022-11-07 DIAGNOSIS — D509 Iron deficiency anemia, unspecified: Secondary | ICD-10-CM | POA: Diagnosis not present

## 2022-11-07 DIAGNOSIS — Z6841 Body Mass Index (BMI) 40.0 and over, adult: Secondary | ICD-10-CM

## 2022-11-07 DIAGNOSIS — R7303 Prediabetes: Secondary | ICD-10-CM | POA: Diagnosis not present

## 2022-11-07 DIAGNOSIS — R5383 Other fatigue: Secondary | ICD-10-CM

## 2022-11-07 DIAGNOSIS — E669 Obesity, unspecified: Secondary | ICD-10-CM

## 2022-11-07 MED ORDER — METFORMIN HCL 500 MG PO TABS
ORAL_TABLET | ORAL | 0 refills | Status: DC
Start: 2022-11-07 — End: 2022-12-12

## 2022-11-07 NOTE — Progress Notes (Signed)
WEIGHT SUMMARY AND BIOMETRICS  Vitals Temp: 98.7 F (37.1 C) BP: 97/65 Pulse Rate: 85 SpO2: 96 %   Anthropometric Measurements Height: 5\' 2"  (1.575 m) Weight: 284 lb (128.8 kg) BMI (Calculated): 51.93 Weight at Last Visit: 282 lb Weight Lost Since Last Visit: 0 Weight Gained Since Last Visit: 2 lb Starting Weight: 293 lb Total Weight Loss (lbs): 9 lb (4.082 kg) Peak Weight: 293 lb   Body Composition  Body Fat %: 53.8 % Fat Mass (lbs): 152.8 lbs Muscle Mass (lbs): 124.8 lbs Visceral Fat Rating : 19   Other Clinical Data Fasting: no Labs: no Today's Visit #: 68 Starting Date: 10/02/17    Chief Complaint:   OBESITY [name] is here to discuss her progress with her obesity treatment plan. She is on the the Category 3 Plan and keeping a food journal and adhering to recommended goals of 1300-1500 calories and 100+ protein and states she is following her eating plan approximately 65 % of the time. She states she is not currently exercising.   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 She and her family will travel to [place] for 5 days!  FIRST TRIP SINCE COVID-19 PANDEMIC  Subjective:   1. Pre-diabetes Discussed Labs  Latest Reference Range & Units 10/17/22 08:29  Hemoglobin A1C 4.8 - 5.6 % 5.7 (H)  Est. average glucose Bld gHb Est-mCnc mg/dL 161  INSULIN 2.6 - 09.6 uIU/mL 7.2  (H): Data is abnormally high  2. Other fatigue Discussed Labs  Latest Reference Range & Units 10/17/22 08:29  TSH 0.450 - 4.500 uIU/mL 1.050  T4,Free(Direct) 0.82 - 1.77 ng/dL 0.45   3. Iron deficiency anemia, unspecified iron deficiency anemia type Discussed Labs  Latest Reference Range & Units 09/18/22 09:37  WBC 4.0 - 10.5 K/uL 14.8 (H)  RBC 3.87 - 5.11 Mil/uL 4.44  Hemoglobin 12.0 - 15.0 g/dL 40.9 (L)  HCT 81.1 - 91.4 % 37.3  MCV 78.0 - 100.0 fl 84.1  MCHC 30.0 - 36.0 g/dL 78.2   RDW 95.6 - 21.3 % 16.1 (H)  Platelets 150.0 - 400.0 K/uL 410.0 (H)  (H): Data is abnormally high (L): Data is abnormally low  She denies unusual bleeding/bruising. She denies known family hx of blood disorders  4. Vitamin D deficiency Discussed Labs  Latest Reference Range & Units 10/17/22 08:29  Vitamin D, 25-Hydroxy 30.0 - 100.0 ng/mL 49.4    Assessment/Plan:   1. Pre-diabetes Refill - metFORMIN (GLUCOPHAGE) 500 MG tablet; 1 full tab daily with breakfast and 1 at lunch  Dispense: 60 tablet; Refill: 0  2. Other fatigue Continue to remain well hydrated Practice Good Sleep Hygiene Continue Ergocalciferol and Ferrous Gluconate  3. Iron deficiency anemia, unspecified iron deficiency anemia type F/u with various specialists Continue   ferrous gluconate (FERGON) 324 MG tablet   4. Vitamin D deficiency Continue weekly Ergocalciferol- does not need RF today  5. Obesity, current BMI 50.0  Arlethia is currently in the action stage of change. As such, her goal is to continue with weight loss efforts. She has agreed to the Category 3 Plan and keeping a food journal and adhering to recommended goals of 1300-1500 calories and 100+ protein.   Exercise goals: For substantial health benefits, adults should do at least 150 minutes (2 hours and 30 minutes) a week of moderate-intensity, or 75 minutes (1 hour and 15 minutes) a week of vigorous-intensity aerobic physical  activity, or an equivalent combination of moderate- and vigorous-intensity aerobic activity. Aerobic activity should be performed in episodes of at least 10 minutes, and preferably, it should be spread throughout the week.  Behavioral modification strategies: increasing lean protein intake, decreasing simple carbohydrates, increasing vegetables, increasing water intake, emotional eating strategies, travel eating strategies, and planning for success.  Ivelise has agreed to follow-up with our clinic in 4 weeks. She was informed of  the importance of frequent follow-up visits to maximize her success with intensive lifestyle modifications for her multiple health conditions.   Objective:   Blood pressure 97/65, pulse 85, temperature 98.7 F (37.1 C), height 5\' 2"  (1.575 m), weight 284 lb (128.8 kg), SpO2 96 %, not currently breastfeeding. Body mass index is 51.94 kg/m.  General: Cooperative, alert, well developed, in no acute distress. HEENT: Conjunctivae and lids unremarkable. Cardiovascular: Regular rhythm.  Lungs: Normal work of breathing. Neurologic: No focal deficits.   Lab Results  Component Value Date   CREATININE 0.73 09/18/2022   BUN 13 09/18/2022   NA 137 09/18/2022   K 3.7 09/18/2022   CL 99 09/18/2022   CO2 31 09/18/2022   Lab Results  Component Value Date   ALT 10 09/18/2022   AST 14 09/18/2022   ALKPHOS 70 09/18/2022   BILITOT 0.2 09/18/2022   Lab Results  Component Value Date   HGBA1C 5.7 (H) 10/17/2022   HGBA1C 5.6 06/13/2022   HGBA1C 5.8 (H) 12/26/2021   HGBA1C 5.2 03/15/2021   HGBA1C 5.1 10/31/2020   Lab Results  Component Value Date   INSULIN 7.2 10/17/2022   INSULIN 11.5 06/13/2022   INSULIN 5.1 12/26/2021   INSULIN 4.4 03/15/2021   INSULIN 4.2 10/31/2020   Lab Results  Component Value Date   TSH 1.050 10/17/2022   Lab Results  Component Value Date   CHOL 166 03/15/2021   HDL 63 03/15/2021   LDLCALC 92 03/15/2021   TRIG 57 03/15/2021   CHOLHDL 2.6 03/15/2021   Lab Results  Component Value Date   VD25OH 49.4 10/17/2022   VD25OH 39.5 06/13/2022   VD25OH 37.53 02/07/2022   Lab Results  Component Value Date   WBC 14.8 (H) 09/18/2022   HGB 11.8 (L) 09/18/2022   HCT 37.3 09/18/2022   MCV 84.1 09/18/2022   PLT 410.0 (H) 09/18/2022   Lab Results  Component Value Date   IRON 42 09/19/2022   TIBC 302.4 09/19/2022   FERRITIN 7.8 (L) 09/19/2022   Attestation Statements:   Reviewed by clinician on day of visit: allergies, medications, problem list, medical  history, surgical history, family history, social history, and previous encounter notes.  I have reviewed the above documentation for accuracy and completeness, and I agree with the above. -  Thadeus Gandolfi d. Virl Coble, NP-C

## 2022-11-19 ENCOUNTER — Other Ambulatory Visit (INDEPENDENT_AMBULATORY_CARE_PROVIDER_SITE_OTHER): Payer: Self-pay | Admitting: Adult Health

## 2022-11-23 ENCOUNTER — Ambulatory Visit (INDEPENDENT_AMBULATORY_CARE_PROVIDER_SITE_OTHER): Payer: BC Managed Care – PPO

## 2022-11-23 ENCOUNTER — Other Ambulatory Visit (INDEPENDENT_AMBULATORY_CARE_PROVIDER_SITE_OTHER): Payer: BC Managed Care – PPO

## 2022-11-23 VITALS — BP 115/76 | HR 81 | Temp 97.7°F | Resp 20 | Ht 62.5 in | Wt 290.4 lb

## 2022-11-23 DIAGNOSIS — K5000 Crohn's disease of small intestine without complications: Secondary | ICD-10-CM | POA: Diagnosis not present

## 2022-11-23 DIAGNOSIS — Z8719 Personal history of other diseases of the digestive system: Secondary | ICD-10-CM

## 2022-11-23 LAB — COMPREHENSIVE METABOLIC PANEL
ALT: 10 U/L (ref 0–35)
AST: 14 U/L (ref 0–37)
Albumin: 3.5 g/dL (ref 3.5–5.2)
Alkaline Phosphatase: 73 U/L (ref 39–117)
BUN: 12 mg/dL (ref 6–23)
CO2: 28 mEq/L (ref 19–32)
Calcium: 8.9 mg/dL (ref 8.4–10.5)
Chloride: 102 mEq/L (ref 96–112)
Creatinine, Ser: 0.69 mg/dL (ref 0.40–1.20)
GFR: 109.17 mL/min (ref >60.00)
Glucose, Bld: 83 mg/dL (ref 70–99)
Potassium: 3.8 mEq/L (ref 3.5–5.1)
Sodium: 137 mEq/L (ref 135–145)
Total Bilirubin: 0.4 mg/dL (ref 0.2–1.2)
Total Protein: 6.9 g/dL (ref 6.0–8.3)

## 2022-11-23 LAB — CBC WITH DIFFERENTIAL/PLATELET
Basophils Absolute: 0.1 10*3/uL (ref 0.0–0.1)
Basophils Relative: 0.8 % (ref 0.0–3.0)
Eosinophils Absolute: 0.2 10*3/uL (ref 0.0–0.7)
Eosinophils Relative: 1.6 % (ref 0.0–5.0)
HCT: 40.5 % (ref 36.0–46.0)
Hemoglobin: 12.9 g/dL (ref 12.0–15.0)
Lymphocytes Relative: 30.5 % (ref 12.0–46.0)
Lymphs Abs: 2.9 10*3/uL (ref 0.7–4.0)
MCHC: 31.9 g/dL (ref 30.0–36.0)
MCV: 86.4 fl (ref 78.0–100.0)
Monocytes Absolute: 0.8 10*3/uL (ref 0.1–1.0)
Monocytes Relative: 8.8 % (ref 3.0–12.0)
Neutro Abs: 5.6 10*3/uL (ref 1.4–7.7)
Neutrophils Relative %: 58.3 % (ref 43.0–77.0)
Platelets: 339 10*3/uL (ref 150.0–400.0)
RBC: 4.69 Mil/uL (ref 3.87–5.11)
RDW: 18.4 % — ABNORMAL HIGH (ref 11.5–15.5)
WBC: 9.5 10*3/uL (ref 4.0–10.5)

## 2022-11-23 LAB — C-REACTIVE PROTEIN: CRP: 1.8 mg/dL (ref 0.5–20.0)

## 2022-11-23 LAB — SEDIMENTATION RATE: Sed Rate: 58 mm/hr — ABNORMAL HIGH (ref 0–20)

## 2022-11-23 MED ORDER — ACETAMINOPHEN 325 MG PO TABS: 650.0000 mg | ORAL_TABLET | Freq: Once | ORAL | Status: DC

## 2022-11-23 MED ORDER — SODIUM CHLORIDE 0.9 % IV SOLN
5.0000 mg/kg | INTRAVENOUS | Status: DC
  Administered 2022-11-23: 700 mg via INTRAVENOUS
  Filled 2022-11-23: qty 70

## 2022-11-23 MED ORDER — METHYLPREDNISOLONE SODIUM SUCC 40 MG IJ SOLR: 40.0000 mg | Freq: Once | INTRAMUSCULAR | Status: DC

## 2022-11-23 MED ORDER — DIPHENHYDRAMINE HCL 25 MG PO CAPS: 25.0000 mg | ORAL_CAPSULE | Freq: Once | ORAL | Status: DC

## 2022-11-23 NOTE — Progress Notes (Addendum)
Diagnosis: Crohn's Disease  Provider:  Chilton Greathouse MD  Procedure: IV Infusion  IV Type: Peripheral, IV Location: L Antecubital  Avsola (infliximab-axxq), Dose: 700mg   Infusion Start Time: 1017  Infusion Stop Time: 1233  Post Infusion IV Care: Peripheral IV Discontinued  1.40  pre infusion lab drawn and resulted.  Dr Mansouraty's nurse notified about the lab results.  Discharge: Condition: Good, Destination: Home . AVS Declined  Performed by:  Garnette Czech, RN

## 2022-11-27 ENCOUNTER — Encounter: Payer: Self-pay | Admitting: Gastroenterology

## 2022-11-28 NOTE — Telephone Encounter (Signed)
Dr Meridee Score per your last office note;   "Colonoscopy for evaluation of Mucosal healing this summer/fall pending all is well"  Do you want her to have colon now?

## 2022-11-29 NOTE — Telephone Encounter (Signed)
[name], I still believe it is reasonable to move forward with colonoscopy to ensure that she has mucosal healing. Please offer her next available colonoscopy that works with her schedule with me. Thanks. GM

## 2022-11-30 NOTE — Telephone Encounter (Signed)
Patient has scheduled a colonoscopy for 02/01/23 and previsit 12/31/22. She also notes she had labwork done recently but hasn't heard back. I will forward those labs to Dr Meridee Score as it appears they were ordered under a different physician.

## 2022-11-30 NOTE — Telephone Encounter (Signed)
Left message for patient to call back  

## 2022-12-05 ENCOUNTER — Telehealth: Payer: Self-pay

## 2022-12-05 NOTE — Telephone Encounter (Signed)
Pt aware she needs to keep appt as scheduled.

## 2022-12-05 NOTE — Telephone Encounter (Signed)
-----   Message from Lemar Lofty., MD sent at 12/02/2022  3:14 AM EDT ----- Karen Kitchens, Thank you for forwarding this to me. No change in our plan of action of moving forward with colonoscopy as a scheduled. Please let the patient know this. Thanks. GM ----- Message ----- From: Richardson Chiquito, RN Sent: 11/30/2022   4:27 PM EDT To: Lemar Lofty., MD  Dr Bradly Bienenstock apparently were requested by you but inadvertently were placed with Dr Shirlee More name instead since patient got them drawn at the time of her infusion. Please review and let me know if you have any additional recommendations other than moving forward with colonoscopy (she is scheduled 9/6) Thanks

## 2022-12-12 ENCOUNTER — Ambulatory Visit (INDEPENDENT_AMBULATORY_CARE_PROVIDER_SITE_OTHER): Payer: BC Managed Care – PPO | Admitting: Adult Health

## 2022-12-12 ENCOUNTER — Encounter (INDEPENDENT_AMBULATORY_CARE_PROVIDER_SITE_OTHER): Payer: Self-pay | Admitting: Adult Health

## 2022-12-12 VITALS — BP 104/64 | HR 75 | Temp 98.2°F | Ht 62.0 in | Wt 283.0 lb

## 2022-12-12 DIAGNOSIS — R7303 Prediabetes: Secondary | ICD-10-CM | POA: Diagnosis not present

## 2022-12-12 DIAGNOSIS — R632 Polyphagia: Secondary | ICD-10-CM

## 2022-12-12 DIAGNOSIS — Z6841 Body Mass Index (BMI) 40.0 and over, adult: Secondary | ICD-10-CM | POA: Diagnosis not present

## 2022-12-12 DIAGNOSIS — E669 Obesity, unspecified: Secondary | ICD-10-CM

## 2022-12-12 MED ORDER — METFORMIN HCL 500 MG PO TABS
ORAL_TABLET | ORAL | 0 refills | Status: DC
Start: 2022-12-12 — End: 2023-02-18

## 2022-12-12 NOTE — Progress Notes (Signed)
WEIGHT SUMMARY AND BIOMETRICS  Vitals Temp: 98.2 F (36.8 C) BP: 104/64 Pulse Rate: 75 SpO2: 99 %   Anthropometric Measurements Height: 5\' 2"  (1.575 m) Weight: 283 lb (128.4 kg) BMI (Calculated): 51.75 Weight at Last Visit: 284lb Weight Lost Since Last Visit: 1lb Weight Gained Since Last Visit: 0 Starting Weight: 293lb Total Weight Loss (lbs): 10 lb (4.536 kg) Peak Weight: 293lb   Body Composition  Body Fat %: 50.5 % Fat Mass (lbs): 143 lbs Muscle Mass (lbs): 133.2 lbs Total Body Water (lbs): 103.4 lbs Visceral Fat Rating : 18   Other Clinical Data Fasting: no Labs: no Today's Visit #: 5 Starting Date: 10/02/17    Chief Complaint:   OBESITY [name] is here to discuss her progress with her obesity treatment plan. She is on the keeping a food journal and adhering to recommended goals of 1300-1500 calories and 100+ protein and states she is following her eating plan approximately 80 % of the time. She states she is exercising NEAT Activities.   Interim History:  [name] enjoyed a recent family trip to [place]- [place] 19[place]23  She starting tracking intake consistently November 25, 2022 and since tracking  been consuming >118g protein per day.  Reviewed Bioempednce results with pt: Muscle Mass: +8.4 lbs Adipose Mass: -9.8 lbs  Subjective:   1. Polyphagia Prior to tracking intake on November 25, 2022- she was experiencing polyphagia. Since she started tracking and consuming >100g/day, appetite and polyphagia have dramatically reduced.  2. Pre-diabetes Lab Results  Component Value Date   HGBA1C 5.7 (H) 10/17/2022   HGBA1C 5.6 06/13/2022   HGBA1C 5.8 (H) 12/26/2021    [name] has been taking Metformin 500mg  daily consistently then BID consistently the last two weeks. She denies GI upset with BID dosing. The last several weeks, she denies polyphagia the last two weeks.  Assessment/Plan:   1. Polyphagia Continue to increase protein and limit  sugar/CHO  2. Pre-diabetes Refill - metFORMIN (GLUCOPHAGE) 500 MG tablet; 1 full tab daily with breakfast and 1 at lunch  Dispense: 60 tablet; Refill: 0  3. Obesity, current BMI 50.0  [name] is currently in the action stage of change. As such, her goal is to continue with weight loss efforts. She has agreed to keeping a food journal and adhering to recommended goals of 1300-1500 calories and 100+ protein.   Exercise goals: All adults should avoid inactivity. Some physical activity is better than none, and adults who participate in any amount of physical activity gain some health benefits. Adults should also include muscle-strengthening activities that involve all major muscle groups on 2 or more days a week.  Behavioral modification strategies: increasing lean protein intake, decreasing simple carbohydrates, increasing vegetables, increasing water intake, no skipping meals, meal planning and cooking strategies, and planning for success.  [name] has agreed to follow-up with our clinic in 4 weeks. She was informed of the importance of frequent follow-up visits to maximize her success with intensive lifestyle modifications for her multiple health conditions.   Objective:   Blood pressure 104/64, pulse 75, temperature 98.2 F (36.8 C), height 5\' 2"  (1.575 m), weight 283 lb (128.4 kg), SpO2 99%. Body mass index is 51.76 kg/m.  General: Cooperative, alert, well developed, in no acute distress. HEENT: Conjunctivae and lids unremarkable. Cardiovascular: Regular rhythm.  Lungs: Normal work of breathing. Neurologic: No focal deficits.   Lab Results  Component Value Date   CREATININE 0.69 11/23/2022   BUN 12 11/23/2022  NA 137 11/23/2022   K 3.8 11/23/2022   CL 102 11/23/2022   CO2 28 11/23/2022   Lab Results  Component Value Date   ALT 10 11/23/2022   AST 14 11/23/2022   ALKPHOS 73 11/23/2022   BILITOT 0.4 11/23/2022   Lab Results  Component Value Date   HGBA1C 5.7 (H)  10/17/2022   HGBA1C 5.6 06/13/2022   HGBA1C 5.8 (H) 12/26/2021   HGBA1C 5.2 03/15/2021   HGBA1C 5.1 10/31/2020   Lab Results  Component Value Date   INSULIN 7.2 10/17/2022   INSULIN 11.5 06/13/2022   INSULIN 5.1 12/26/2021   INSULIN 4.4 03/15/2021   INSULIN 4.2 10/31/2020   Lab Results  Component Value Date   TSH 1.050 10/17/2022   Lab Results  Component Value Date   CHOL 166 03/15/2021   HDL 63 03/15/2021   LDLCALC 92 03/15/2021   TRIG 57 03/15/2021   CHOLHDL 2.6 03/15/2021   Lab Results  Component Value Date   VD25OH 49.4 10/17/2022   VD25OH 39.5 06/13/2022   VD25OH 37.53 02/07/2022   Lab Results  Component Value Date   WBC 9.5 11/23/2022   HGB 12.9 11/23/2022   HCT 40.5 11/23/2022   MCV 86.4 11/23/2022   PLT 339.0 11/23/2022   Lab Results  Component Value Date   IRON 42 09/19/2022   TIBC 302.4 09/19/2022   FERRITIN 7.8 (L) 09/19/2022    Attestation Statements:   Reviewed by clinician on day of visit: allergies, medications, problem list, medical history, surgical history, family history, social history, and previous encounter notes.  I have reviewed the above documentation for accuracy and completeness, and I agree with the above. -  Sherlie Boyum d. Piccola Arico, NP-C

## 2022-12-18 ENCOUNTER — Encounter: Payer: Self-pay | Admitting: Gastroenterology

## 2022-12-18 ENCOUNTER — Encounter: Payer: Self-pay | Admitting: Hematology and Oncology

## 2022-12-18 NOTE — Progress Notes (Unsigned)
No chief complaint on file.   HPI: Patient  [name]  42 y.o. comes in today for Preventive Health Care visit   Health Maintenance  Topic Date Due   Hepatitis C Screening  Never done   COVID-19 Vaccine (5 - 2023-24 season) 05/25/2022   INFLUENZA VACCINE  12/27/2022   PAP SMEAR-Modifier  04/27/2024   DTaP/Tdap/Td (4 - Td or Tdap) 10/13/2030   HIV Screening  Completed   HPV VACCINES  Aged Out   Health Maintenance Review LIFESTYLE:  Exercise:   Tobacco/ETS: Alcohol:  Sugar beverages: Sleep: Drug use: no HH of  Work:    ROS:  GEN/ HEENT: No fever, significant weight changes sweats headaches vision problems hearing changes, CV/ PULM; No chest pain shortness of breath cough, syncope,edema  change in exercise tolerance. GI /GU: No adominal pain, vomiting, change in bowel habits. No blood in the stool. No significant GU symptoms. SKIN/HEME: ,no acute skin rashes suspicious lesions or bleeding. No lymphadenopathy, nodules, masses.  NEURO/ PSYCH:  No neurologic signs such as weakness numbness. No depression anxiety. IMM/ Allergy: No unusual infections.  Allergy .   REST of 12 system review negative except as per HPI   Past Medical History:  Diagnosis Date   Abrasion of upper arm with infection, left, sequela    started antibiotics lef arm oct 4th started antibiotics healing well   Anemia    hx of 2 yrs ago   Chicken pox    Chronic rhinitis    Contact dermatitis and other eczema, due to unspecified cause    Crohn's 11-15-11   no issues in 2 yrs   Fibroids 11-15-11   remains with fibroids   Hernia    Incisional hernia, RLQ 10/02/2011   Infertility, female    Mononucleosis    Obesity    Pilonidal abscess    Rectal abscess    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HCC)    Vitamin D deficiency     Past Surgical History:  Procedure Laterality Date   APPENDECTOMY  7/06   BIOPSY  01/25/2022   Procedure: BIOPSY;  Surgeon: Lynann Bologna, MD;  Location: [facility];  Service: Gastroenterology;;   BIOPSY  01/26/2022   Procedure: BIOPSY;  Surgeon: Lynann Bologna, MD;  Location: [facility];  Service: Gastroenterology;;   CESAREAN SECTION N/A 11/30/2020   Procedure: CESAREAN SECTION;  Surgeon: Osborn Coho, MD;  Location: [facility] LD ORS;  Service: Obstetrics;  Laterality: N/A;   COLONOSCOPY WITH PROPOFOL N/A 03/07/2017   Procedure: COLONOSCOPY WITH PROPOFOL;  Surgeon: Rachael Fee, MD;  Location: [facility] ENDOSCOPY;  Service: Endoscopy;  Laterality: N/A;   COLONOSCOPY WITH PROPOFOL N/A 01/26/2022   Procedure: COLONOSCOPY WITH PROPOFOL;  Surgeon: Lynann Bologna, MD;  Location: [facility] ENDOSCOPY;  Service: Gastroenterology;  Laterality: N/A;   ESOPHAGOGASTRODUODENOSCOPY (EGD) WITH PROPOFOL N/A 01/25/2022   Procedure: ESOPHAGOGASTRODUODENOSCOPY (EGD) WITH PROPOFOL;  Surgeon: Lynann Bologna, MD;  Location: [facility] ENDOSCOPY;  Service: Gastroenterology;  Laterality: N/A;   EYE SURGERY  11-15-11   2003-multiple stys   ileocecal resection  1/11   crohns disease with fisula/stricture. Dr. Bertram Savin   INCISIONAL HERNIA REPAIR N/A 12/24/2018   Procedure: OPEN REPAIR VENTRAL INCISIONAL HERNIA WITH MESH PATCH AND PRIMARY REPAIR OF LEFT SPIGELIAN HERNIA;  Surgeon: Darnell Level, MD;  Location: [facility] ORS;  Service: General;  Laterality: N/A;   KNEE ARTHROSCOPY  99, 02   Bil.   MYOMECTOMY N/A 11/10/2014   Procedure: MYOMECTOMY;  Surgeon: Osborn Coho, MD;  Location: [facility] ORS;  Service: Gynecology;  Laterality: N/A;   PILONIDAL CYST EXCISION  2011, 2012   I&Ds   VENTRAL HERNIA REPAIR  11/27/2011   Procedure: LAPAROSCOPIC VENTRAL HERNIA;  Surgeon: Ardeth Sportsman, MD;  Location: [facility] ORS;  Service: General;  Laterality: N/A;  Laparoscopic Exploration & Repair of Hernia in Abdomen   WISDOM TOOTH EXTRACTION      Family History  Problem Relation Age of Onset   Aneurysm Mother        brain   Heart disease Mother 78       heart anyrism   Healthy Father        fairly   Diabetes Sister         half sister   Bipolar disorder Sister        half sister   Multiple sclerosis Sister    Colon cancer Maternal Grandmother    Stomach cancer Paternal Grandfather    Esophageal cancer Neg Hx    Inflammatory bowel disease Neg Hx    Liver disease Neg Hx    Pancreatic cancer Neg Hx    Rectal cancer Neg Hx     Social History   Socioeconomic History   Marital status: Married    Spouse name: Jaelene Garciagarcia   Number of children: Not on file   Years of education: Not on file   Highest education level: Bachelor's degree (e.g., BA, AB, BS)  Occupational History   Occupation: Editor, commissioning  Tobacco Use   Smoking status: Never   Smokeless tobacco: Never  Vaping Use   Vaping status: Never Used  Substance and Sexual Activity   Alcohol use: No   Drug use: No   Sexual activity: Yes    Partners: Male  Other Topics Concern   Not on file  Social History Narrative   married   HH of 2   Occupation: Editor, 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    No pets   Sleep 6-7 hours    BF with 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   Walking exercise   Eats balanced generally   Social Determinants of Health   Financial Resource Strain: Low Risk  (09/11/2022)   Overall Financial Resource Strain (CARDIA)    Difficulty of Paying Living Expenses: Not hard at all  Food Insecurity: No Food Insecurity (09/11/2022)   Hunger Vital Sign    Worried About Running Out of Food in the Last Year: Never true    Ran Out of Food in the Last Year: Never true  Transportation Needs: No Transportation Needs (09/11/2022)   PRAPARE - Administrator, Civil Service (Medical): No    Lack of Transportation (Non-Medical): No  Physical Activity: Unknown (09/11/2022)   Exercise Vital Sign    Days of Exercise per Week: Patient declined    Minutes of Exercise per Session: Not on file  Stress: No Stress Concern Present (09/11/2022)   [employer] of Occupational Health - Occupational Stress Questionnaire    Feeling of  Stress : Only a little  Social Connections: Unknown (09/11/2022)   Social Connection and Isolation Panel [NHANES]    Frequency of Communication with Friends and Family: More than three times a week    Frequency of Social Gatherings with Friends and Family: Patient declined    Attends Religious Services: Patient declined    Active Member of Clubs or Organizations: Yes    Attends Banker Meetings: 1 to 4 times per year  Marital Status: Married    Outpatient Medications Prior to Visit  Medication Sig Dispense Refill   Calcium Carb-Cholecalciferol (CALCIUM/VITAMIN D) 600-400 MG-UNIT TABS Take 1 tablet by mouth at bedtime.     ferrous gluconate (FERGON) 324 MG tablet Take 1 tablet (324 mg total) by mouth daily with breakfast. 30 tablet 3   fexofenadine (ALLEGRA) 180 MG tablet Take 180 mg by mouth at bedtime.     inFLIXimab-axxq (AVSOLA) 100 MG injection Inject 1 mg into the vein once a week.     metFORMIN (GLUCOPHAGE) 500 MG tablet 1 full tab daily with breakfast and 1 at lunch 60 tablet 0   Prenatal Vit-Fe Fumarate-FA (PRENATAL MULTIVITAMIN) TABS tablet Take 1 tablet by mouth at bedtime.     Vitamin D, Ergocalciferol, (DRISDOL) 1.25 MG (50000 UNIT) CAPS capsule Take 1 capsule (50,000 Units total) by mouth every 7 (seven) days. 4 capsule 0   Facility-Administered Medications Prior to Visit  Medication Dose Route Frequency Provider Last Rate Last Admin   acetaminophen (TYLENOL) tablet 650 mg  650 mg Oral Once Mansouraty, Netty Starring., MD       diphenhydrAMINE (BENADRYL) capsule 25 mg  25 mg Oral Once Mansouraty, Netty Starring., MD       methylPREDNISolone sodium succinate (SOLU-MEDROL) 40 mg/mL injection 40 mg  40 mg Intravenous Once Mansouraty, Netty Starring., MD         EXAM:  There were no vitals taken for this visit.  There is no height or weight on file to calculate BMI. Wt Readings from Last 3 Encounters:  12/12/22 283 lb (128.4 kg)  11/23/22 290 lb 6.4 oz (131.7 kg)   11/07/22 284 lb (128.8 kg)    Physical Exam: Vital signs reviewed [id number] is a well-developed well-nourished alert cooperative    who appearsr stated age in no acute distress.  HEENT: normocephalic atraumatic , Eyes: PERRL EOM's full, conjunctiva clear, Nares: paten,t no deformity discharge or tenderness., Ears: no deformity EAC's clear TMs with normal landmarks. Mouth: clear OP, no lesions, edema.  Moist mucous membranes. Dentition in adequate repair. NECK: supple without masses, thyromegaly or bruits. CHEST/PULM:  Clear to auscultation and percussion breath sounds equal no wheeze , rales or rhonchi. No chest wall deformities or tenderness. Breast: normal by inspection . No dimpling, discharge, masses, tenderness or discharge . CV: PMI is nondisplaced, S1 S2 no gallops, murmurs, rubs. Peripheral pulses are full without delay.No JVD .  ABDOMEN: Bowel sounds normal nontender  No guard or rebound, no hepato splenomegal no CVA tenderness.  No hernia. Extremtities:  No clubbing cyanosis or edema, no acute joint swelling or redness no focal atrophy NEURO:  Oriented x3, cranial nerves 3-12 appear to be intact, no obvious focal weakness,gait within normal limits no abnormal reflexes or asymmetrical SKIN: No acute rashes normal turgor, color, no bruising or petechiae. PSYCH: Oriented, good eye contact, no obvious depression anxiety, cognition and judgment appear normal. LN: no cervical axillary inguinal adenopathy  Lab Results  Component Value Date   WBC 9.5 11/23/2022   HGB 12.9 11/23/2022   HCT 40.5 11/23/2022   PLT 339.0 11/23/2022   GLUCOSE 83 11/23/2022   CHOL 166 03/15/2021   TRIG 57 03/15/2021   HDL 63 03/15/2021   LDLCALC 92 03/15/2021   ALT 10 11/23/2022   AST 14 11/23/2022   NA 137 11/23/2022   K 3.8 11/23/2022   CL 102 11/23/2022   CREATININE 0.69 11/23/2022   BUN 12 11/23/2022   CO2 28 11/23/2022  TSH 1.050 10/17/2022   INR 1.47 05/26/2009   HGBA1C 5.7 (H) 10/17/2022     BP Readings from Last 3 Encounters:  12/12/22 104/64  11/23/22 115/76  11/07/22 97/65    Lab results reviewed with patient   ASSESSMENT AND PLAN:  Discussed the following assessment and plan:  No diagnosis found. No follow-ups on file.  Patient Care Team: Madelin Headings, MD as PCP - General Rachael Fee, MD (Gastroenterology) Osborn Coho, MD (Obstetrics and Gynecology) Donnetta Hail, MD (Rheumatology) Roswell Nickel, DO as Consulting Physician [facility]) Lynann Bologna, MD as Consulting Physician (Gastroenterology) Rachel Moulds, [name] There are no Patient Instructions on file for this visit.  Neta Mends. Kym Scannell M.D.

## 2022-12-19 ENCOUNTER — Ambulatory Visit (INDEPENDENT_AMBULATORY_CARE_PROVIDER_SITE_OTHER): Payer: BC Managed Care – PPO | Admitting: Internal Medicine

## 2022-12-19 ENCOUNTER — Telehealth: Payer: Self-pay | Admitting: *Deleted

## 2022-12-19 ENCOUNTER — Other Ambulatory Visit: Payer: Self-pay | Admitting: Gastroenterology

## 2022-12-19 ENCOUNTER — Encounter: Payer: Self-pay | Admitting: Internal Medicine

## 2022-12-19 VITALS — BP 100/66 | HR 79 | Temp 98.4°F | Ht 63.0 in | Wt 284.4 lb

## 2022-12-19 DIAGNOSIS — Z6841 Body Mass Index (BMI) 40.0 and over, adult: Secondary | ICD-10-CM

## 2022-12-19 DIAGNOSIS — Z79899 Other long term (current) drug therapy: Secondary | ICD-10-CM | POA: Diagnosis not present

## 2022-12-19 DIAGNOSIS — Z1159 Encounter for screening for other viral diseases: Secondary | ICD-10-CM | POA: Diagnosis not present

## 2022-12-19 DIAGNOSIS — K5090 Crohn's disease, unspecified, without complications: Secondary | ICD-10-CM

## 2022-12-19 DIAGNOSIS — Z13220 Encounter for screening for lipoid disorders: Secondary | ICD-10-CM

## 2022-12-19 DIAGNOSIS — Z0000 Encounter for general adult medical examination without abnormal findings: Secondary | ICD-10-CM

## 2022-12-19 DIAGNOSIS — M722 Plantar fascial fibromatosis: Secondary | ICD-10-CM

## 2022-12-19 LAB — LIPID PANEL
Cholesterol: 151 mg/dL (ref 0–200)
HDL: 45.2 mg/dL (ref >39.00)
LDL Cholesterol: 96 mg/dL (ref 0–99)
NonHDL: 105.57
Total CHOL/HDL Ratio: 3
Triglycerides: 47 mg/dL (ref 0.0–149.0)
VLDL: 9.4 mg/dL (ref 0.0–40.0)

## 2022-12-19 NOTE — Telephone Encounter (Signed)
Dr Meridee Score her procedure is not until Sept. Do you want to see if she can get to an allowed BMI?

## 2022-12-19 NOTE — Telephone Encounter (Signed)
Please see labs 11/23/22. Ok to refill Ferrous Glucontate?

## 2022-12-19 NOTE — Telephone Encounter (Signed)
Pt had an office visit today with her PCP, weight 50.3 BMI. Does she need to be scheduled at hospital ?

## 2022-12-19 NOTE — Telephone Encounter (Signed)
She has had them all in the hospital.  I am fine with him being in the hospital.  If she wants to give it a go to see if her weight decreases in 3 weeks, then I am okay with that.  She can let us know what she wants to do. GM

## 2022-12-19 NOTE — Telephone Encounter (Signed)
Note her BMI was 51.4 when you saw here in office in April

## 2022-12-19 NOTE — Patient Instructions (Signed)
Continue working on healthy weight loss .  Heel support and   cushion  pm stretch  .   Hx stretch . At night   ice and  am  stretch and foot wear.   Otherwise . If  persistent or progressive consider sports medicine appt referral.    Fasting lipid panel today .

## 2022-12-19 NOTE — Telephone Encounter (Signed)
Was hoping that patient's weight would be down where she could have her procedure done in the [facility]. Since she does not meet criteria, she will need to be done in the hospital. I do not need to see her back in clinic. She can be scheduled on any of my available dates in the hospital. Thanks. GM

## 2022-12-20 LAB — HEPATITIS C ANTIBODY: Hepatitis C Ab: NONREACTIVE

## 2022-12-20 NOTE — Progress Notes (Signed)
Cholesterol level is in favorable range  hep c results pending  expected to be negative

## 2022-12-21 ENCOUNTER — Ambulatory Visit (INDEPENDENT_AMBULATORY_CARE_PROVIDER_SITE_OTHER): Payer: BC Managed Care – PPO

## 2022-12-21 ENCOUNTER — Telehealth: Payer: Self-pay | Admitting: Hematology and Oncology

## 2022-12-21 ENCOUNTER — Ambulatory Visit: Payer: BC Managed Care – PPO

## 2022-12-21 VITALS — BP 112/72 | HR 74 | Temp 97.6°F | Resp 18 | Ht 63.0 in | Wt 288.4 lb

## 2022-12-21 DIAGNOSIS — K5000 Crohn's disease of small intestine without complications: Secondary | ICD-10-CM | POA: Diagnosis not present

## 2022-12-21 DIAGNOSIS — Z8719 Personal history of other diseases of the digestive system: Secondary | ICD-10-CM | POA: Diagnosis not present

## 2022-12-21 MED ORDER — DIPHENHYDRAMINE HCL 25 MG PO CAPS: 25.0000 mg | ORAL_CAPSULE | Freq: Once | ORAL | Status: DC

## 2022-12-21 MED ORDER — METHYLPREDNISOLONE SODIUM SUCC 40 MG IJ SOLR: 40.0000 mg | Freq: Once | INTRAMUSCULAR | Status: DC

## 2022-12-21 MED ORDER — SODIUM CHLORIDE 0.9 % IV SOLN
5.0000 mg/kg | INTRAVENOUS | Status: DC
  Administered 2022-12-21: 600 mg via INTRAVENOUS
  Filled 2022-12-21: qty 60

## 2022-12-21 MED ORDER — ACETAMINOPHEN 325 MG PO TABS: 650.0000 mg | ORAL_TABLET | Freq: Once | ORAL | Status: DC

## 2022-12-21 NOTE — Progress Notes (Signed)
Diagnosis: Crohn's Disease  Provider:  Chilton Greathouse MD  Procedure: IV Infusion  IV Type: Peripheral, IV Location: L Antecubital  Avsola (infliximab-axxq), Dose: 600 mg  Infusion Start Time: 0945  Infusion Stop Time: 1200  Post Infusion IV Care: Peripheral IV Discontinued  Discharge: Condition: Good, Destination: Home . AVS Declined  Performed by:  Adriana Mccallum, RN

## 2022-12-25 ENCOUNTER — Other Ambulatory Visit: Payer: Self-pay

## 2022-12-25 DIAGNOSIS — K50918 Crohn's disease, unspecified, with other complication: Secondary | ICD-10-CM

## 2022-12-25 NOTE — Telephone Encounter (Signed)
The pt has been moved to [facility] on 01/24/23 at 215 pm  The pt has been advised and will keep appt for previsit as planned

## 2022-12-27 ENCOUNTER — Telehealth: Payer: Self-pay | Admitting: *Deleted

## 2022-12-27 NOTE — Telephone Encounter (Signed)
[name],  This pt's BMI is greater than 50; their procedure will need to be performed at the hospital.  Thanks,  John Nulty 

## 2022-12-28 ENCOUNTER — Encounter: Payer: Self-pay | Admitting: Hematology and Oncology

## 2022-12-28 ENCOUNTER — Encounter: Payer: Self-pay | Admitting: Gastroenterology

## 2022-12-31 ENCOUNTER — Ambulatory Visit (AMBULATORY_SURGERY_CENTER): Payer: BC Managed Care – PPO | Admitting: *Deleted

## 2022-12-31 VITALS — Ht 62.0 in | Wt 284.0 lb

## 2022-12-31 DIAGNOSIS — K50918 Crohn's disease, unspecified, with other complication: Secondary | ICD-10-CM

## 2022-12-31 MED ORDER — NA SULFATE-K SULFATE-MG SULF 17.5-3.13-1.6 GM/177ML PO SOLN: 1.0000 | Freq: Once | ORAL | 0 refills | Status: AC

## 2022-12-31 NOTE — Progress Notes (Signed)
Pt's previsit is done over the phone and all paperwork (prep instructions) sent to patient.  Pt's name and DOB verified at the beginning of the previsit.  Pt denies any difficulty with ambulating.    No egg or soy allergy known to patient  No issues known to pt with past sedation with any surgeries or procedures Patient denies ever being told they had issues or difficulty with intubation  No FH of Malignant Hyperthermia Pt is not on diet pills Pt is not on  home 02  Pt is not on blood thinners  Pt denies issues with constipation  Pt is not on dialysis Pt denies any upcoming cardiac testing Pt encouraged to use to use Singlecare or Goodrx to reduce cost  Pt at [employer] d/t BMI

## 2023-01-10 ENCOUNTER — Encounter: Payer: Self-pay | Admitting: Gastroenterology

## 2023-01-14 ENCOUNTER — Encounter: Payer: Self-pay | Admitting: *Deleted

## 2023-01-14 ENCOUNTER — Inpatient Hospital Stay: Payer: BC Managed Care – PPO | Admitting: Hematology and Oncology

## 2023-01-14 ENCOUNTER — Encounter: Payer: Self-pay | Admitting: Hematology and Oncology

## 2023-01-14 ENCOUNTER — Inpatient Hospital Stay: Payer: BC Managed Care – PPO | Attending: Hematology and Oncology

## 2023-01-14 ENCOUNTER — Inpatient Hospital Stay: Payer: BC Managed Care – PPO

## 2023-01-14 VITALS — BP 115/55 | HR 72 | Temp 97.7°F | Resp 18 | Ht 62.0 in | Wt 285.2 lb

## 2023-01-14 DIAGNOSIS — D509 Iron deficiency anemia, unspecified: Secondary | ICD-10-CM

## 2023-01-14 DIAGNOSIS — D649 Anemia, unspecified: Secondary | ICD-10-CM | POA: Diagnosis present

## 2023-01-14 LAB — CBC WITH DIFFERENTIAL/PLATELET
Abs Immature Granulocytes: 0.05 10*3/uL (ref 0.00–0.07)
Basophils Absolute: 0 10*3/uL (ref 0.0–0.1)
Basophils Relative: 0 %
Eosinophils Absolute: 0.2 10*3/uL (ref 0.0–0.5)
Eosinophils Relative: 1 %
HCT: 40.2 % (ref 36.0–46.0)
Hemoglobin: 12.9 g/dL (ref 12.0–15.0)
Immature Granulocytes: 0 %
Lymphocytes Relative: 27 %
Lymphs Abs: 3.2 10*3/uL (ref 0.7–4.0)
MCH: 28.8 pg (ref 26.0–34.0)
MCHC: 32.1 g/dL (ref 30.0–36.0)
MCV: 89.7 fL (ref 80.0–100.0)
Monocytes Absolute: 1.1 10*3/uL — ABNORMAL HIGH (ref 0.1–1.0)
Monocytes Relative: 9 %
Neutro Abs: 7.3 10*3/uL (ref 1.7–7.7)
Neutrophils Relative %: 63 %
Platelets: 318 10*3/uL (ref 150–400)
RBC: 4.48 MIL/uL (ref 3.87–5.11)
RDW: 15.7 % — ABNORMAL HIGH (ref 11.5–15.5)
WBC: 11.9 10*3/uL — ABNORMAL HIGH (ref 4.0–10.5)
nRBC: 0 % (ref 0.0–0.2)

## 2023-01-14 LAB — IRON AND IRON BINDING CAPACITY (CC-WL,HP ONLY)
Iron: 38 ug/dL (ref 28–170)
Saturation Ratios: 13 % (ref 10.4–31.8)
TIBC: 290 ug/dL (ref 250–450)
UIBC: 252 ug/dL (ref 148–442)

## 2023-01-14 LAB — FERRITIN: Ferritin: 13 ng/mL (ref 11–307)

## 2023-01-14 NOTE — Progress Notes (Signed)
[facility] CONSULT NOTE  Patient Care Team: Panosh, Neta Mends, MD as PCP - General (Internal Medicine) Rachael Fee, MD (Gastroenterology) Osborn Coho, MD (Obstetrics and Gynecology) Donnetta Hail, MD (Rheumatology) Roswell Nickel, DO as Consulting Physician (Bariatrics) Lynann Bologna, MD as Consulting Physician (Gastroenterology) Rachel Moulds, [name]  CHIEF COMPLAINTS/PURPOSE OF CONSULTATION:  Anemia  ASSESSMENT & PLAN:   This is a very pleasant 42 year old female patient with Crohn's disease, severe anemia with multiple ER visits and hospitalizations recently, currently on infliximab for the past several years with great response referred to hematology initially for recommendations regarding severe anemia.  She received IV iron, tolerated it very well, feels remarkably improved.   She is now here for follow up. She is now on monthly infliximab.  Since her last visit here, she had no more exacerbations of Crohn's and is doing quite well.  Her fatigue is also not as bothersome.  She is able to do all her chores without much difficulty. No concerns on physical exam today.  CBC today showed hemoglobin of 12.9, iron panel was normal, ferritin level was pending At this time we have recommended that she continue oral iron supplementation once a day and follow-up with [name] in approximately 6 months or sooner if she has any relapse of Crohn's disease.  HISTORY OF PRESENTING ILLNESS:  [name] 42 y.o. female is here because of anemia and thrombocytosis.  This is a 42 year old with Crohn's disease status post colonic resection and currently on infliximab who was recently seen in the emergency room with syncopal episode was found to have Hemoccult positive and hemoglobin of 6.9, significant drop from hemoglobin of 12.1.  She was admitted to the hospital with GI bleeding and underwent upper GI endoscopy that was essentially  negative and showed a small antral erosion.  She also underwent colonoscopy which showed multiple anastomotic ulcers with signs of Crohn's recurrence.  She was then given packed red blood cells and was discharged to also follow-up with hematology.  She is here for a follow up. She denies any new health complaints, fatigue is well-controlled.  She denies any pica, shortness of breath, palpitations. She denies any blood per stool or melena.  She continues to have regular menstrual cycles.  Rest of the pertinent 10 point ROS reviewed and negative  REVIEW OF SYSTEMS:   Constitutional: Denies fevers, chills or abnormal night sweats Eyes: Denies blurriness of vision, double vision or watery eyes Ears, nose, mouth, throat, and face: Denies mucositis or sore throat Respiratory: Denies cough, dyspnea or wheezes Cardiovascular: Denies palpitation, chest discomfort or lower extremity swelling Gastrointestinal:  Denies nausea, heartburn or change in bowel habits Skin: Denies abnormal skin rashes Lymphatics: Denies new lymphadenopathy or easy bruising Neurological:Denies numbness, tingling or new weaknesses Behavioral/Psych: Mood is stable, no new changes  All other systems were reviewed with the patient and are negative.  MEDICAL HISTORY:  Past Medical History:  Diagnosis Date   Abrasion of upper arm with infection, left, sequela    started antibiotics lef arm oct 4th started antibiotics healing well   Anemia    hx of 2 yrs ago   Blood transfusion without reported diagnosis    Chicken pox    Chronic rhinitis    Contact dermatitis and other eczema, due to unspecified cause    Crohn's 11/15/2011   no issues in 2 yrs   Fibroids 11/15/2011   remains with fibroids   Hernia  Incisional hernia, RLQ 10/02/2011   Infertility, female    Mononucleosis    Obesity    Pilonidal abscess    Rectal abscess    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HCC)    Vitamin D deficiency     SURGICAL  HISTORY: Past Surgical History:  Procedure Laterality Date   APPENDECTOMY  11/2004   BIOPSY  01/25/2022   Procedure: BIOPSY;  Surgeon: Lynann Bologna, MD;  Location: [facility];  Service: Gastroenterology;;   BIOPSY  01/26/2022   Procedure: BIOPSY;  Surgeon: Lynann Bologna, MD;  Location: [facility];  Service: Gastroenterology;;   CESAREAN SECTION N/A 11/30/2020   Procedure: CESAREAN SECTION;  Surgeon: Osborn Coho, MD;  Location: [facility]S;  Service: Obstetrics;  Laterality: N/A;   COLONOSCOPY     COLONOSCOPY WITH PROPOFOL N/A 03/07/2017   Procedure: COLONOSCOPY WITH PROPOFOL;  Surgeon: Rachael Fee, MD;  Location: [facility];  Service: Endoscopy;  Laterality: N/A;   COLONOSCOPY WITH PROPOFOL N/A 01/26/2022   Procedure: COLONOSCOPY WITH PROPOFOL;  Surgeon: Lynann Bologna, MD;  Location: [facility];  Service: Gastroenterology;  Laterality: N/A;   ESOPHAGOGASTRODUODENOSCOPY (EGD) WITH PROPOFOL N/A 01/25/2022   Procedure: ESOPHAGOGASTRODUODENOSCOPY (EGD) WITH PROPOFOL;  Surgeon: Lynann Bologna, MD;  Location: [facility];  Service: Gastroenterology;  Laterality: N/A;   EYE SURGERY  11/15/2011   2003-multiple stys   ileocecal resection  05/2009   crohns disease with fisula/stricture. Dr. Bertram Savin   INCISIONAL HERNIA REPAIR N/A 12/24/2018   Procedure: OPEN REPAIR VENTRAL INCISIONAL HERNIA WITH MESH PATCH AND PRIMARY REPAIR OF LEFT SPIGELIAN HERNIA;  Surgeon: Darnell Level, MD;  Location: [facility]S;  Service: General;  Laterality: N/A;   KNEE ARTHROSCOPY  99, 02   Bil.   MYOMECTOMY N/A 11/10/2014   Procedure: MYOMECTOMY;  Surgeon: Osborn Coho, MD;  Location: [facility] ORS;  Service: Gynecology;  Laterality: N/A;   PILONIDAL CYST EXCISION  2011, 2012   I&Ds   UPPER GASTROINTESTINAL ENDOSCOPY     VENTRAL HERNIA REPAIR  11/27/2011   Procedure: LAPAROSCOPIC VENTRAL HERNIA;  Surgeon: Ardeth Sportsman, MD;  Location: [facility];  Service: General;  Laterality: N/A;  Laparoscopic Exploration &  Repair of Hernia in Abdomen   WISDOM TOOTH EXTRACTION      SOCIAL HISTORY: Social History   Socioeconomic History   Marital status: Married    Spouse name: Natalin Reinertson   Number of children: Not on file   Years of education: Not on file   Highest education level: Bachelor's degree (e.g., BA, AB, BS)  Occupational History   Occupation: Editor, commissioning  Tobacco Use   Smoking status: Never   Smokeless tobacco: Never  Vaping Use   Vaping status: Never Used  Substance and Sexual Activity   Alcohol use: No   Drug use: No   Sexual activity: Yes    Partners: Male  Other Topics Concern   Not on file  Social History Narrative   married   HH of 2   Occupation: Editor, 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    No pets   Sleep 6-7 hours    BF with 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   Walking exercise   Eats balanced generally   Social Determinants of Health   Financial Resource Strain: Low Risk  (09/11/2022)   Overall Financial Resource Strain (CARDIA)    Difficulty of Paying Living Expenses: Not hard at all  Food Insecurity: No Food Insecurity (09/11/2022)   Hunger Vital Sign    Worried  About Running Out of Food in the Last Year: Never true    Ran Out of Food in the Last Year: Never true  Transportation Needs: No Transportation Needs (09/11/2022)   PRAPARE - Administrator, Civil Service (Medical): No    Lack of Transportation (Non-Medical): No  Physical Activity: Insufficiently Active (12/19/2022)   Exercise Vital Sign    Days of Exercise per Week: 1 day    Minutes of Exercise per Session: 20 min  Stress: No Stress Concern Present (09/11/2022)   [employer] of Occupational Health - Occupational Stress Questionnaire    Feeling of Stress : Only a little  Social Connections: Socially Integrated (12/19/2022)   Social Connection and Isolation Panel [NHANES]    Frequency of Communication with Friends and Family: More than three times a week    Frequency of Social Gatherings  with Friends and Family: Patient declined    Attends Religious Services: More than 4 times per year    Active Member of [employer] or Organizations: Yes    Attends Engineer, structural: More than 4 times per year    Marital Status: Married  Catering manager Violence: Not At Risk (12/19/2022)   Humiliation, Afraid, Rape, and Kick questionnaire    Fear of Current or Ex-Partner: No    Emotionally Abused: No    Physically Abused: No    Sexually Abused: No    FAMILY HISTORY: Family History  Problem Relation Age of Onset   Aneurysm Mother        brain   Heart disease Mother 20       heart anyrism   Healthy Father        fairly   Diabetes Sister        half sister   Bipolar disorder Sister        half sister   Multiple sclerosis Sister    Stomach cancer Paternal Grandfather    Esophageal cancer Neg Hx    Inflammatory bowel disease Neg Hx    Liver disease Neg Hx    Pancreatic cancer Neg Hx    Rectal cancer Neg Hx    Colon cancer Neg Hx     ALLERGIES:  is allergic to ibuprofen and naproxen.  MEDICATIONS:  Current Outpatient Medications  Medication Sig Dispense Refill   Calcium Carb-Cholecalciferol (CALCIUM/VITAMIN D) 600-400 MG-UNIT TABS Take 1 tablet by mouth at bedtime.     ferrous gluconate (FERGON) 324 MG tablet TAKE 1 TABLET BY MOUTH DAILY WITH BREAKFAST 90 tablet 1   fexofenadine (ALLEGRA) 180 MG tablet Take 180 mg by mouth at bedtime.     inFLIXimab-axxq (AVSOLA) 100 MG injection Inject 1 mg into the vein. Every month     metFORMIN (GLUCOPHAGE) 500 MG tablet 1 full tab daily with breakfast and 1 at lunch 60 tablet 0   Prenatal Vit-Fe Fumarate-FA (PRENATAL MULTIVITAMIN) TABS tablet Take 1 tablet by mouth at bedtime.     Vitamin D, Ergocalciferol, (DRISDOL) 1.25 MG (50000 UNIT) CAPS capsule Take 1 capsule (50,000 Units total) by mouth every 7 (seven) days. 4 capsule 0   No current facility-administered medications for this visit.   Facility-Administered Medications  Ordered in Other Visits  Medication Dose Route Frequency Provider Last Rate Last Admin   acetaminophen (TYLENOL) tablet 650 mg  650 mg Oral Once Mansouraty, Netty Starring., MD       diphenhydrAMINE (BENADRYL) capsule 25 mg  25 mg Oral Once Mansouraty, Netty Starring., MD  methylPREDNISolone sodium succinate (SOLU-MEDROL) 40 mg/mL injection 40 mg  40 mg Intravenous Once Mansouraty, Netty Starring., MD         PHYSICAL EXAMINATION: ECOG PERFORMANCE STATUS: 0 - Asymptomatic  Vitals:   01/14/23 1026  BP: (!) 115/55  Pulse: 72  Resp: 18  Temp: 97.7 F (36.5 C)  SpO2: 100%    Filed Weights   01/14/23 1026  Weight: 285 lb 3.2 oz (129.4 kg)     Physical Exam Constitutional:      Appearance: Normal appearance.  Cardiovascular:     Rate and Rhythm: Normal rate.     Pulses: Normal pulses.  Pulmonary:     Effort: Pulmonary effort is normal.  Musculoskeletal:        General: Normal range of motion.     Cervical back: Normal range of motion. No rigidity.  Lymphadenopathy:     Cervical: No cervical adenopathy.  Skin:    General: Skin is warm and dry.  Neurological:     General: No focal deficit present.     Mental Status: She is alert.      LABORATORY DATA:  I have reviewed the data as listed Lab Results  Component Value Date   WBC 11.9 (H) 01/14/2023   HGB 12.9 01/14/2023   HCT 40.2 01/14/2023   MCV 89.7 01/14/2023   PLT 318 01/14/2023     Chemistry      Component Value Date/Time   NA 137 11/23/2022 1008   NA 140 12/26/2021 0946   K 3.8 11/23/2022 1008   CL 102 11/23/2022 1008   CO2 28 11/23/2022 1008   BUN 12 11/23/2022 1008   BUN 16 12/26/2021 0946   CREATININE 0.69 11/23/2022 1008      Component Value Date/Time   CALCIUM 8.9 11/23/2022 1008   CALCIUM 7.6 (L) 01/25/2022 0328   ALKPHOS 73 11/23/2022 1008   AST 14 11/23/2022 1008   ALT 10 11/23/2022 1008   BILITOT 0.4 11/23/2022 1008   BILITOT 0.3 12/26/2021 0946       RADIOGRAPHIC STUDIES: I have  personally reviewed the radiological images as listed and agreed with the findings in the report. No results found.  All questions were answered. The patient knows to call the clinic with any problems, questions or concerns. I spent 20 minutes in the care of this patient including H and P, review of records, counseling and coordination of care.     Rachel Moulds, MD 01/14/2023 10:48 AM

## 2023-01-16 ENCOUNTER — Encounter (HOSPITAL_COMMUNITY): Payer: Self-pay | Admitting: Gastroenterology

## 2023-01-16 NOTE — Progress Notes (Addendum)
Anesthesia Review:  PCP: [name]- LOV 12/19/22  Oncology- [name] LOV 01/14/2023  Cardiologist : none  Chest x-ray : 09/14/22- 2 view  EKG : 02/09/22  Echo : 01/25/22  Stress test: Cardiac Cath :  Activity level:  Sleep Study/ CPAP : none  Fasting Blood Sugar :      / Checks Blood Sugar -- times a day:   Blood Thinner/ Instructions /Last Dose: ASA / Instructions/ Last Dose :    01/14/23- CBC/Diff- 01/14/2023

## 2023-01-17 ENCOUNTER — Encounter (INDEPENDENT_AMBULATORY_CARE_PROVIDER_SITE_OTHER): Payer: Self-pay | Admitting: Adult Health

## 2023-01-17 ENCOUNTER — Ambulatory Visit (INDEPENDENT_AMBULATORY_CARE_PROVIDER_SITE_OTHER): Payer: BC Managed Care – PPO | Admitting: Adult Health

## 2023-01-17 DIAGNOSIS — E559 Vitamin D deficiency, unspecified: Secondary | ICD-10-CM

## 2023-01-17 DIAGNOSIS — E669 Obesity, unspecified: Secondary | ICD-10-CM | POA: Diagnosis not present

## 2023-01-17 DIAGNOSIS — Z9889 Other specified postprocedural states: Secondary | ICD-10-CM

## 2023-01-17 DIAGNOSIS — Z6841 Body Mass Index (BMI) 40.0 and over, adult: Secondary | ICD-10-CM | POA: Diagnosis not present

## 2023-01-17 MED ORDER — VITAMIN D (ERGOCALCIFEROL) 1.25 MG (50000 UNIT) PO CAPS: 50000.0000 [IU] | ORAL_CAPSULE | ORAL | 0 refills | Status: DC

## 2023-01-17 NOTE — Progress Notes (Signed)
WEIGHT SUMMARY AND BIOMETRICS  Vitals Temp: (!) 97.5 F (36.4 C) BP: 119/79 Pulse Rate: 71 SpO2: 99 %   Anthropometric Measurements Height: 5\' 2"  (1.575 m) Weight: 282 lb (127.9 kg) BMI (Calculated): 51.57 Weight at Last Visit: 283lb Weight Lost Since Last Visit: 1lb Weight Gained Since Last Visit: 0 Starting Weight: 293lb Total Weight Loss (lbs): 11 lb (4.99 kg) Peak Weight: 293lb   Body Composition  Body Fat %: 53.7 % Fat Mass (lbs): 151.4 lbs Muscle Mass (lbs): 124 lbs Visceral Fat Rating : 19   Other Clinical Data Fasting: yes Labs: no Today's Visit #: 1 Starting Date: 10/02/17    Chief Complaint:   OBESITY [name] is here to discuss her progress with her obesity treatment plan. She is on the keeping a food journal and adhering to recommended goals of 1300-1500 calories and 100+ protein and states she is following her eating plan approximately 99 % of the time. She states she is not currently exercising.   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 Students will return to classes next week.  Her daughter goes to local daycare, daughter is 65 years old  Exercise-not currently exercising due to hectic schedule  She has made a real effort to grocery shop on plan, meal plan/prep for week. She plans on taking lunch to work- has small refrigerator and small crock pot to store and prep food.  Subjective:   1. Hx of colonoscopy Followed closely by [facility] Hx of Crohn's disease of both small and large intestine and iron deficiency anemia.  Last Colonoscopy 01/26/2022  She denies first degree family hx of colon cancer  She denies current abdominal pain or hematochezia  2. Vitamin D deficiency  Latest Reference Range & Units 10/17/22 08:29  Vitamin D, 25-Hydroxy 30.0 - 100.0 ng/mL 49.4   She is on weekly Ergocalciferol- denies N/V/Muscle Weakness  Assessment/Plan:   1.  Hx of colonoscopy Complete scheduled C Scope  2. Vitamin D deficiency Refill Vitamin D, Ergocalciferol, (DRISDOL) 1.25 MG (50000 UNIT) CAPS capsule Take 1 capsule (50,000 Units total) by mouth every 7 (seven) days. Dispense: 4 capsule, Refills: 0 ordered   3. Obesity, current BMI 51.57  Colton is currently in the action stage of change. As such, her goal is to continue with weight loss efforts. She has agreed to keeping a food journal and adhering to recommended goals of 1300-1500 calories and 100+ protein.   Exercise goals: All adults should avoid inactivity. Some physical activity is better than none, and adults who participate in any amount of physical activity gain some health benefits.  Behavioral modification strategies: increasing lean protein intake, decreasing simple carbohydrates, increasing vegetables, increasing water intake, no skipping meals, meal planning and cooking strategies, keeping healthy foods in the home, and planning for success.  Mama has agreed to follow-up with our clinic in 4 weeks. She was informed of the importance of frequent follow-up visits to maximize her success with intensive lifestyle modifications for her multiple health conditions.   Objective:   Blood pressure 119/79, pulse 71, temperature (!) 97.5 F (36.4 C), height 5\' 2"  (1.575 m), weight 282 lb (127.9 kg), last menstrual period 12/17/2022, SpO2 99%. Body mass index is 51.58 kg/m.  General: Cooperative, alert, well developed, in no acute distress. HEENT: Conjunctivae and lids unremarkable. Cardiovascular: Regular rhythm.  Lungs: Normal work of breathing. Neurologic: No focal deficits.   Lab Results  Component  Value Date   CREATININE 0.69 11/23/2022   BUN 12 11/23/2022   NA 137 11/23/2022   K 3.8 11/23/2022   CL 102 11/23/2022   CO2 28 11/23/2022   Lab Results  Component Value Date   ALT 10 11/23/2022   AST 14 11/23/2022   ALKPHOS 73 11/23/2022   BILITOT 0.4 11/23/2022   Lab  Results  Component Value Date   HGBA1C 5.7 (H) 10/17/2022   HGBA1C 5.6 06/13/2022   HGBA1C 5.8 (H) 12/26/2021   HGBA1C 5.2 03/15/2021   HGBA1C 5.1 10/31/2020   Lab Results  Component Value Date   INSULIN 7.2 10/17/2022   INSULIN 11.5 06/13/2022   INSULIN 5.1 12/26/2021   INSULIN 4.4 03/15/2021   INSULIN 4.2 10/31/2020   Lab Results  Component Value Date   TSH 1.050 10/17/2022   Lab Results  Component Value Date   CHOL 151 12/19/2022   HDL 45.20 12/19/2022   LDLCALC 96 12/19/2022   TRIG 47.0 12/19/2022   CHOLHDL 3 12/19/2022   Lab Results  Component Value Date   VD25OH 49.4 10/17/2022   VD25OH 39.5 06/13/2022   VD25OH 37.53 02/07/2022   Lab Results  Component Value Date   WBC 11.9 (H) 01/14/2023   HGB 12.9 01/14/2023   HCT 40.2 01/14/2023   MCV 89.7 01/14/2023   PLT 318 01/14/2023   Lab Results  Component Value Date   IRON 38 01/14/2023   TIBC 290 01/14/2023   FERRITIN 13 01/14/2023    Attestation Statements:   Reviewed by clinician on day of visit: allergies, medications, problem list, medical history, surgical history, family history, social history, and previous encounter notes.  I have reviewed the above documentation for accuracy and completeness, and I agree with the above. -  Tomasa Dobransky d. Imani Fiebelkorn, NP-C

## 2023-01-18 ENCOUNTER — Other Ambulatory Visit: Payer: BC Managed Care – PPO

## 2023-01-18 ENCOUNTER — Ambulatory Visit: Payer: BC Managed Care – PPO

## 2023-01-18 ENCOUNTER — Ambulatory Visit: Payer: BC Managed Care – PPO | Admitting: Hematology and Oncology

## 2023-01-18 ENCOUNTER — Encounter: Payer: Self-pay | Admitting: Hematology and Oncology

## 2023-01-18 ENCOUNTER — Encounter: Payer: Self-pay | Admitting: Gastroenterology

## 2023-01-18 VITALS — BP 110/74 | HR 78 | Temp 97.6°F | Resp 16 | Ht 62.0 in | Wt 287.4 lb

## 2023-01-18 DIAGNOSIS — Z8719 Personal history of other diseases of the digestive system: Secondary | ICD-10-CM | POA: Diagnosis not present

## 2023-01-18 DIAGNOSIS — K5000 Crohn's disease of small intestine without complications: Secondary | ICD-10-CM

## 2023-01-18 MED ORDER — METHYLPREDNISOLONE SODIUM SUCC 40 MG IJ SOLR: 40.0000 mg | Freq: Once | INTRAMUSCULAR | Status: DC

## 2023-01-18 MED ORDER — DIPHENHYDRAMINE HCL 25 MG PO CAPS: 25.0000 mg | ORAL_CAPSULE | Freq: Once | ORAL | Status: DC

## 2023-01-18 MED ORDER — ACETAMINOPHEN 325 MG PO TABS: 650.0000 mg | ORAL_TABLET | Freq: Once | ORAL | Status: DC

## 2023-01-18 MED ORDER — SODIUM CHLORIDE 0.9 % IV SOLN
5.0000 mg/kg | INTRAVENOUS | Status: DC
  Administered 2023-01-18: 600 mg via INTRAVENOUS
  Filled 2023-01-18: qty 60

## 2023-01-18 NOTE — Progress Notes (Signed)
Diagnosis: Crohn's Disease  Provider:  Chilton Greathouse MD  Procedure: IV Infusion  IV Type: Peripheral, IV Location: L Antecubital  Avsola (infliximab-axxq), Dose: 600 mg  Infusion Start Time: 0948  Infusion Stop Time: 1205  Post Infusion IV Care: Peripheral IV Discontinued  Discharge: Condition: Good, Destination: Home . AVS Declined  Performed by:  Adriana Mccallum, RN

## 2023-01-22 ENCOUNTER — Encounter: Payer: Self-pay | Admitting: Hematology and Oncology

## 2023-01-22 ENCOUNTER — Encounter: Payer: Self-pay | Admitting: Gastroenterology

## 2023-01-24 ENCOUNTER — Ambulatory Visit (HOSPITAL_COMMUNITY)
Admission: RE | Admit: 2023-01-24 | Discharge: 2023-01-24 | Disposition: A | Discharge status: Home / Self Care | Payer: BC Managed Care – PPO | Source: Ambulatory Visit | Attending: Gastroenterology | Admitting: Gastroenterology

## 2023-01-24 ENCOUNTER — Encounter (HOSPITAL_COMMUNITY): Payer: Self-pay | Admitting: Gastroenterology

## 2023-01-24 ENCOUNTER — Encounter (HOSPITAL_COMMUNITY)
Admission: RE | Disposition: A | Discharge status: Home / Self Care | Payer: Self-pay | Source: Ambulatory Visit | Attending: Gastroenterology

## 2023-01-24 ENCOUNTER — Ambulatory Visit (HOSPITAL_COMMUNITY): Payer: BC Managed Care – PPO | Admitting: Registered Nurse

## 2023-01-24 ENCOUNTER — Other Ambulatory Visit: Payer: Self-pay

## 2023-01-24 DIAGNOSIS — K644 Residual hemorrhoidal skin tags: Secondary | ICD-10-CM | POA: Diagnosis not present

## 2023-01-24 DIAGNOSIS — Z6841 Body Mass Index (BMI) 40.0 and over, adult: Secondary | ICD-10-CM | POA: Insufficient documentation

## 2023-01-24 DIAGNOSIS — K5190 Ulcerative colitis, unspecified, without complications: Secondary | ICD-10-CM

## 2023-01-24 DIAGNOSIS — Z980 Intestinal bypass and anastomosis status: Secondary | ICD-10-CM | POA: Diagnosis not present

## 2023-01-24 DIAGNOSIS — Z1211 Encounter for screening for malignant neoplasm of colon: Secondary | ICD-10-CM | POA: Insufficient documentation

## 2023-01-24 DIAGNOSIS — K633 Ulcer of intestine: Secondary | ICD-10-CM | POA: Insufficient documentation

## 2023-01-24 DIAGNOSIS — K649 Unspecified hemorrhoids: Secondary | ICD-10-CM

## 2023-01-24 DIAGNOSIS — K641 Second degree hemorrhoids: Secondary | ICD-10-CM | POA: Diagnosis not present

## 2023-01-24 DIAGNOSIS — K50918 Crohn's disease, unspecified, with other complication: Secondary | ICD-10-CM

## 2023-01-24 DIAGNOSIS — Z8719 Personal history of other diseases of the digestive system: Secondary | ICD-10-CM

## 2023-01-24 DIAGNOSIS — K5010 Crohn's disease of large intestine without complications: Secondary | ICD-10-CM | POA: Diagnosis not present

## 2023-01-24 DIAGNOSIS — K529 Noninfective gastroenteritis and colitis, unspecified: Secondary | ICD-10-CM | POA: Insufficient documentation

## 2023-01-24 HISTORY — PX: BIOPSY: SHX5522

## 2023-01-24 HISTORY — PX: COLONOSCOPY WITH PROPOFOL: SHX5780

## 2023-01-24 LAB — GLUCOSE, CAPILLARY: Glucose-Capillary: 78 mg/dL (ref 70–99)

## 2023-01-24 SURGERY — COLONOSCOPY WITH PROPOFOL
Anesthesia: Monitor Anesthesia Care

## 2023-01-24 MED ORDER — SODIUM CHLORIDE 0.9 % IV SOLN: INTRAVENOUS | Status: DC

## 2023-01-24 MED ORDER — LIDOCAINE 2% (20 MG/ML) 5 ML SYRINGE
INTRAMUSCULAR | Status: DC | PRN
  Administered 2023-01-24: 50 mg via INTRAVENOUS

## 2023-01-24 MED ORDER — PROPOFOL 500 MG/50ML IV EMUL
INTRAVENOUS | Status: DC | PRN
  Administered 2023-01-24: 20 mg via INTRAVENOUS
  Administered 2023-01-24: 140 ug/kg/min via INTRAVENOUS

## 2023-01-24 MED ORDER — LACTATED RINGERS IV SOLN: INTRAVENOUS | Status: DC

## 2023-01-24 MED ORDER — EPHEDRINE SULFATE-NACL 50-0.9 MG/10ML-% IV SOSY
PREFILLED_SYRINGE | INTRAVENOUS | Status: DC | PRN
  Administered 2023-01-24: 5 mg via INTRAVENOUS

## 2023-01-24 SURGICAL SUPPLY — 22 items
ELECT REM PT RETURN 9FT ADLT (ELECTROSURGICAL)
ELECTRODE REM PT RTRN 9FT ADLT (ELECTROSURGICAL) IMPLANT
FCP BXJMBJMB 240X2.8X (CUTTING FORCEPS)
FLOOR PAD 36X40 (MISCELLANEOUS) ×2
FORCEPS BIOP RAD 4 LRG CAP 4 (CUTTING FORCEPS) IMPLANT
FORCEPS BIOP RJ4 240 W/NDL (CUTTING FORCEPS)
FORCEPS BXJMBJMB 240X2.8X (CUTTING FORCEPS) IMPLANT
INJECTOR/SNARE I SNARE (MISCELLANEOUS) IMPLANT
LUBRICANT JELLY 4.5OZ STERILE (MISCELLANEOUS) IMPLANT
MANIFOLD NEPTUNE II (INSTRUMENTS) IMPLANT
NDL SCLEROTHERAPY 25GX240 (NEEDLE) IMPLANT
NEEDLE SCLEROTHERAPY 25GX240 (NEEDLE)
PAD FLOOR 36X40 (MISCELLANEOUS) ×3 IMPLANT
PROBE APC STR FIRE (PROBE) IMPLANT
PROBE INJECTION GOLD (MISCELLANEOUS)
PROBE INJECTION GOLD 7FR (MISCELLANEOUS) IMPLANT
SNARE ROTATE MED OVAL 20MM (MISCELLANEOUS) IMPLANT
SYR 50ML LL SCALE MARK (SYRINGE) IMPLANT
TRAP SPECIMEN MUCOUS 40CC (MISCELLANEOUS) IMPLANT
TUBING ENDO SMARTCAP PENTAX (MISCELLANEOUS) IMPLANT
TUBING IRRIGATION ENDOGATOR (MISCELLANEOUS) ×3 IMPLANT
WATER STERILE IRR 1000ML POUR (IV SOLUTION) IMPLANT

## 2023-01-24 NOTE — Op Note (Signed)
[facility] Patient Name: [name] Procedure Date: 01/24/2023 MRN: [id number] Attending MD: Corliss Parish , MD, [id number] Date of Birth: 1981/04/19 CSN: [id number] Age: 42 Admit Type: Inpatient Procedure:                Colonoscopy Indications:              High risk colon cancer surveillance: Crohn's                            colitis of 8 (or more) years duration with                            one-third (or more) of the colon involved,                            Incidental - Follow-up of Crohn's disease Providers:                Corliss Parish, MD, Norman Clay, RN, Brion Aliment, Technician Referring MD:             Neta Mends. Panosh Medicines:                Monitored Anesthesia Care Complications:            No immediate complications. Estimated Blood Loss:     Estimated blood loss was minimal. Procedure:                Pre-Anesthesia Assessment:                           - Prior to the procedure, a History and Physical                            was performed, and patient medications and                            allergies were reviewed. The patient's tolerance of                            previous anesthesia was also reviewed. The risks                            and benefits of the procedure and the sedation                            options and risks were discussed with the patient.                            All questions were answered, and informed consent                            was obtained. Prior Anticoagulants: The patient has  taken no anticoagulant or antiplatelet agents. ASA                            Grade Assessment: II - A patient with mild systemic                            disease. After reviewing the risks and benefits,                            the patient was deemed in satisfactory condition to                            undergo the procedure.                            After obtaining informed consent, the colonoscope                            was passed under direct vision. Throughout the                            procedure, the patient's blood pressure, pulse, and                            oxygen saturations were monitored continuously. The                            CF-HQ190L ([id number]) Olympus colonoscope was                            introduced through the anus and advanced to the the                            ileocolonic anastomosis. The colonoscopy was                            performed without difficulty. The patient tolerated                            the procedure. The quality of the bowel preparation                            was good. Scope In: 1:18:33 PM Scope Out: 1:33:18 PM Scope Withdrawal Time: 0 hours 11 minutes 31 seconds  Total Procedure Duration: 0 hours 14 minutes 45 seconds  Findings:      The digital rectal exam findings include hemorrhoids. Pertinent       negatives include no palpable rectal lesions.      There was evidence of a prior functional end-to-end ileo-colonic       anastomosis in the ascending colon. This was patent and was       characterized by mostly healthy appearing mucosa, though there were a       few erosions and 1 ulcer in the base of this anastomosis. The       anastomosis was traversed  with ease. Biopsies were taken with a cold       forceps for histology.      The neo-terminal ileum appeared normal. Biopsies were taken with a cold       forceps for histology.      Normal mucosa was found in the rest of the visualized colon. Biopsies       were taken with a cold forceps for histology from the right colon.       Biopsies were taken with a cold forceps for histology from the left       colon.      Normal mucosa was found in the rectum. No evidence of any fistula.       Biopsies were taken with a cold forceps for histology.      Non-bleeding non-thrombosed internal hemorrhoids were found during        retroflexion, during perianal exam and during digital exam. The       hemorrhoids were Grade II (internal hemorrhoids that prolapse but reduce       spontaneously). Impression:               - Hemorrhoids found on digital rectal exam.                           - Patent functional end-to-end ileo-colonic                            anastomosis, characterized by mostly healthy                            appearing mucosa, though there were a few erosions                            and 1 ulcer in the base. Biopsied.                           - The examined portion of the neoterminal was                            normal. Biopsied.                           - Normal mucosa in the entire examined colon.                            Biopsied both the right and left colon.                           - Normal mucosa in the rectum. No evidence of                            fistula. Biopsied.                           - Non-bleeding non-thrombosed internal hemorrhoids. Moderate Sedation:      Not Applicable - Patient had care per Anesthesia. Recommendation:           - The patient will be observed post-procedure,  until all discharge criteria are met.                           - Discharge patient to home.                           - Patient has a contact number available for                            emergencies. The signs and symptoms of potential                            delayed complications were discussed with the                            patient. Return to normal activities tomorrow.                            Written discharge instructions were provided to the                            patient.                           - Resume previous diet.                           - Continue present medications.                           - Await pathology results.                           - Depending on results, will repeat colonoscopy in                            1 to 2  years for surveillance based on pathology                            results.                           - Will discuss based on final pathology whether                            there is any role of considering short steroid                            taper versus transition of medication therapy if                            she still has active significant disease on                            pathology.                           -  If patient can return fecal calprotectin in the                            next couple of weeks that would be helpful to know                            her level of inflammation as well-based on her                            colonoscopic evaluation too.                           - The findings and recommendations were discussed                            with the patient.                           - The findings and recommendations were discussed                            with the patient's family. Procedure Code(s):        --- Professional ---                           [id number], Colonoscopy, flexible; with biopsy, single                            or multiple Diagnosis Code(s):        --- Professional ---                           K50.10, Crohn's disease of large intestine without                            complications                           Z98.0, Intestinal bypass and anastomosis status                           K64.1, Second degree hemorrhoids CPT copyright 2022 American Medical Association. All rights reserved. The codes documented in this report are preliminary and upon coder review may  be revised to meet current compliance requirements. Corliss Parish, MD 01/24/2023 1:49:05 PM Number of Addenda: 0

## 2023-01-24 NOTE — Anesthesia Procedure Notes (Signed)
Procedure Name: MAC Date/Time: 01/24/2023 1:11 PM  Performed by: Elisabeth Cara, CRNAPre-anesthesia Checklist: Patient identified, Emergency Drugs available, Suction available, Patient being monitored and Timeout performed Patient Re-evaluated:Patient Re-evaluated prior to induction Placement Confirmation: positive ETCO2 Dental Injury: Teeth and Oropharynx as per pre-operative assessment

## 2023-01-24 NOTE — Anesthesia Preprocedure Evaluation (Signed)
Anesthesia Evaluation  Patient identified by MRN, date of birth, ID band Patient awake    Reviewed: Allergy & Precautions, NPO status , Patient's Chart, lab work & pertinent test results  Airway Mallampati: III  TM Distance: >3 FB Neck ROM: Full    Dental  (+) Teeth Intact, Dental Advisory Given   Pulmonary neg pulmonary ROS   Pulmonary exam normal breath sounds clear to auscultation       Cardiovascular negative cardio ROS Normal cardiovascular exam Rhythm:Regular Rate:Normal     Neuro/Psych negative neurological ROS  negative psych ROS   GI/Hepatic Neg liver ROS,,,Crohn's disease   Endo/Other    Morbid obesity (BMI 51)  Renal/GU negative Renal ROS     Musculoskeletal negative musculoskeletal ROS (+)    Abdominal   Peds  Hematology  (+) Blood dyscrasia, anemia   Anesthesia Other Findings Day of surgery medications reviewed with the patient.  Reproductive/Obstetrics                             Anesthesia Physical Anesthesia Plan  ASA: 4  Anesthesia Plan: MAC   Post-op Pain Management: Minimal or no pain anticipated   Induction: Intravenous  PONV Risk Score and Plan: 2 and TIVA and Treatment may vary due to age or medical condition  Airway Management Planned: Natural Airway and Simple Face Mask  Additional Equipment:   Intra-op Plan:   Post-operative Plan:   Informed Consent: I have reviewed the patients History and Physical, chart, labs and discussed the procedure including the risks, benefits and alternatives for the proposed anesthesia with the patient or authorized representative who has indicated his/her understanding and acceptance.     Dental advisory given  Plan Discussed with: CRNA  Anesthesia Plan Comments:        Anesthesia Quick Evaluation

## 2023-01-24 NOTE — H&P (Signed)
GASTROENTEROLOGY PROCEDURE H&P NOTE   Primary Care Physician: Madelin Headings, MD  HPI: [name] is a 41 y.o. female who presents for Colonoscopy for history of Crohn's for surveillance and evaluation of mucosal healing.  Past Medical History:  Diagnosis Date   Abrasion of upper arm with infection, left, sequela    started antibiotics lef arm oct 4th started antibiotics healing well   Anemia    hx of 2 yrs ago   Blood transfusion without reported diagnosis    Chicken pox    Chronic rhinitis    Contact dermatitis and other eczema, due to unspecified cause    Crohn's 11/15/2011   no issues in 2 yrs   Fibroids 11/15/2011   remains with fibroids   Hernia    Incisional hernia, RLQ 10/02/2011   Infertility, female    Mononucleosis    Obesity    Pilonidal abscess    Rectal abscess    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HCC)    Vitamin D deficiency    Past Surgical History:  Procedure Laterality Date   APPENDECTOMY  11/2004   BIOPSY  01/25/2022   Procedure: BIOPSY;  Surgeon: Lynann Bologna, MD;  Location: [facility];  Service: Gastroenterology;;   BIOPSY  01/26/2022   Procedure: BIOPSY;  Surgeon: Lynann Bologna, MD;  Location: [facility];  Service: Gastroenterology;;   CESAREAN SECTION N/A 11/30/2020   Procedure: CESAREAN SECTION;  Surgeon: Osborn Coho, MD;  Location: [facility];  Service: Obstetrics;  Laterality: N/A;   COLONOSCOPY     COLONOSCOPY WITH PROPOFOL N/A 03/07/2017   Procedure: COLONOSCOPY WITH PROPOFOL;  Surgeon: Rachael Fee, MD;  Location: [facility];  Service: Endoscopy;  Laterality: N/A;   COLONOSCOPY WITH PROPOFOL N/A 01/26/2022   Procedure: COLONOSCOPY WITH PROPOFOL;  Surgeon: Lynann Bologna, MD;  Location: [facility];  Service: Gastroenterology;  Laterality: N/A;   ESOPHAGOGASTRODUODENOSCOPY (EGD) WITH PROPOFOL N/A 01/25/2022   Procedure: ESOPHAGOGASTRODUODENOSCOPY (EGD) WITH PROPOFOL;  Surgeon: Lynann Bologna, MD;  Location: [facility];  Service: Gastroenterology;  Laterality: N/A;   EYE SURGERY  11/15/2011   2003-multiple stys   ileocecal resection  05/2009   crohns disease with fisula/stricture. Dr. Bertram Savin   INCISIONAL HERNIA REPAIR N/A 12/24/2018   Procedure: OPEN REPAIR VENTRAL INCISIONAL HERNIA WITH MESH PATCH AND PRIMARY REPAIR OF LEFT SPIGELIAN HERNIA;  Surgeon: Darnell Level, MD;  Location: [facility];  Service: General;  Laterality: N/A;   KNEE ARTHROSCOPY  99, 02   Bil.   MYOMECTOMY N/A 11/10/2014   Procedure: MYOMECTOMY;  Surgeon: Osborn Coho, MD;  Location: [facility];  Service: Gynecology;  Laterality: N/A;   PILONIDAL CYST EXCISION  2011, 2012   I&Ds   UPPER GASTROINTESTINAL ENDOSCOPY     VENTRAL HERNIA REPAIR  11/27/2011   Procedure: LAPAROSCOPIC VENTRAL HERNIA;  Surgeon: Ardeth Sportsman, MD;  Location: [facility];  Service: General;  Laterality: N/A;  Laparoscopic Exploration & Repair of Hernia in Abdomen   WISDOM TOOTH EXTRACTION     No current facility-administered medications for this encounter.   Facility-Administered Medications Ordered in Other Encounters  Medication Dose Route Frequency Provider Last Rate Last Admin   acetaminophen (TYLENOL) tablet 650 mg  650 mg Oral Once Mansouraty, Netty Starring., MD       diphenhydrAMINE (BENADRYL) capsule 25 mg  25 mg Oral Once Mansouraty, Netty Starring., MD       methylPREDNISolone sodium succinate (SOLU-MEDROL) 40 mg/mL injection 40 mg  40 mg Intravenous Once Mansouraty,  Netty Starring., MD       No current facility-administered medications for this encounter.  Facility-Administered Medications Ordered in Other Encounters:    acetaminophen (TYLENOL) tablet 650 mg, 650 mg, Oral, Once, Mansouraty, Netty Starring., MD   diphenhydrAMINE (BENADRYL) capsule 25 mg, 25 mg, Oral, Once, Mansouraty, Netty Starring., MD   methylPREDNISolone sodium succinate (SOLU-MEDROL) 40 mg/mL injection 40 mg, 40 mg, Intravenous, Once, Mansouraty, Netty Starring., MD Allergies  Allergen  Reactions   Ibuprofen Other (See Comments)    Pt states she had GI internal bleeding last year when took it .    Naproxen Other (See Comments)    Pt states she had GI internal bleeding last year when took it    Family History  Problem Relation Age of Onset   Aneurysm Mother        brain   Heart disease Mother 63       heart anyrism   Healthy Father        fairly   Diabetes Sister        half sister   Bipolar disorder Sister        half sister   Multiple sclerosis Sister    Stomach cancer Paternal Grandfather    Esophageal cancer Neg Hx    Inflammatory bowel disease Neg Hx    Liver disease Neg Hx    Pancreatic cancer Neg Hx    Rectal cancer Neg Hx    Colon cancer Neg Hx    Social History   Socioeconomic History   Marital status: Married    Spouse name: Merly Pistole   Number of children: Not on file   Years of education: Not on file   Highest education level: Bachelor's degree (e.g., BA, AB, BS)  Occupational History   Occupation: Editor, commissioning  Tobacco Use   Smoking status: Never   Smokeless tobacco: Never  Vaping Use   Vaping status: Never Used  Substance and Sexual Activity   Alcohol use: No   Drug use: No   Sexual activity: Yes    Partners: Male  Other Topics Concern   Not on file  Social History Narrative   married   HH of 2   Occupation: Editor, 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    No pets   Sleep 6-7 hours    BF with 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   Walking exercise   Eats balanced generally   Social Determinants of Health   Financial Resource Strain: Low Risk  (09/11/2022)   Overall Financial Resource Strain (CARDIA)    Difficulty of Paying Living Expenses: Not hard at all  Food Insecurity: No Food Insecurity (09/11/2022)   Hunger Vital Sign    Worried About Running Out of Food in the Last Year: Never true    Ran Out of Food in the Last Year: Never true  Transportation Needs: No Transportation Needs (09/11/2022)   PRAPARE - Doctor, general practice (Medical): No    Lack of Transportation (Non-Medical): No  Physical Activity: Insufficiently Active (12/19/2022)   Exercise Vital Sign    Days of Exercise per Week: 1 day    Minutes of Exercise per Session: 20 min  Stress: No Stress Concern Present (09/11/2022)   [employer] of Occupational Health - Occupational Stress Questionnaire    Feeling of Stress : Only a little  Social Connections: Socially Integrated (12/19/2022)   Social Connection and Isolation Panel [NHANES]    Frequency of Communication with  Friends and Family: More than three times a week    Frequency of Social Gatherings with Friends and Family: Patient declined    Attends Religious Services: More than 4 times per year    Active Member of [employer] or Organizations: Yes    Attends Engineer, structural: More than 4 times per year    Marital Status: Married  Catering manager Violence: Not At Risk (12/19/2022)   Humiliation, Afraid, Rape, and Kick questionnaire    Fear of Current or Ex-Partner: No    Emotionally Abused: No    Physically Abused: No    Sexually Abused: No    Physical Exam: Today's Vitals   01/24/23 1241  BP: (!) 136/53  Pulse: 80  Resp: (!) 27  Temp: (!) 97.2 F (36.2 C)  TempSrc: Temporal  SpO2: 100%  Weight: 128.8 kg  Height: 5\' 2"  (1.575 m)  PainSc: 0-No pain   Body mass index is 51.94 kg/m. GEN: NAD EYE: Sclerae anicteric ENT: MMM CV: Non-tachycardic GI: Soft, NT/ND NEURO:  Alert & Oriented x 3  Lab Results: No results for input(s): "WBC", "HGB", "HCT", "PLT" in the last 72 hours. BMET No results for input(s): "NA", "K", "CL", "CO2", "GLUCOSE", "BUN", "CREATININE", "CALCIUM" in the last 72 hours. LFT No results for input(s): "PROT", "ALBUMIN", "AST", "ALT", "ALKPHOS", "BILITOT", "BILIDIR", "IBILI" in the last 72 hours. PT/INR No results for input(s): "LABPROT", "INR" in the last 72 hours.   Impression / Plan: This is a 42 y.o.female who presents for  Colonoscopy for history of Crohn's for surveillance and evaluation of mucosal healing.  The risks and benefits of endoscopic evaluation/treatment were discussed with the patient and/or family; these include but are not limited to the risk of perforation, infection, bleeding, missed lesions, lack of diagnosis, severe illness requiring hospitalization, as well as anesthesia and sedation related illnesses.  The patient's history has been reviewed, patient examined, no change in status, and deemed stable for procedure.  The patient and/or family is agreeable to proceed.    Corliss Parish, MD [facility] Office # [phone]

## 2023-01-24 NOTE — Transfer of Care (Signed)
Immediate Anesthesia Transfer of Care Note  Patient: [name]  Procedure(s) Performed: COLONOSCOPY WITH PROPOFOL BIOPSY  Patient Location: Endoscopy Unit  Anesthesia Type:MAC  Level of Consciousness: awake, alert , oriented, and patient cooperative  Airway & Oxygen Therapy: Patient Spontanous Breathing and Patient connected to face mask oxygen  Post-op Assessment: Report given to RN, Post -op Vital signs reviewed and stable, and Patient moving all extremities  Post vital signs: Reviewed and stable  Last Vitals:  Vitals Value Taken Time  BP 106/36 01/24/23 1340  Temp    Pulse 87 01/24/23 1342  Resp 24 01/24/23 1342  SpO2 100 % 01/24/23 1342  Vitals shown include unfiled device data.  Last Pain:  Vitals:   01/24/23 1241  TempSrc: Temporal  PainSc: 0-No pain         Complications: No notable events documented.

## 2023-01-24 NOTE — Anesthesia Postprocedure Evaluation (Signed)
Anesthesia Post Note  Patient: SIMRANJIT CHERRINGTON  Procedure(s) Performed: COLONOSCOPY WITH PROPOFOL BIOPSY     Patient location during evaluation: Endoscopy Anesthesia Type: MAC Level of consciousness: oriented, awake and alert and awake Pain management: pain level controlled Vital Signs Assessment: post-procedure vital signs reviewed and stable Respiratory status: spontaneous breathing, nonlabored ventilation, respiratory function stable and patient connected to nasal cannula oxygen Cardiovascular status: blood pressure returned to baseline and stable Postop Assessment: no headache, no backache and no apparent nausea or vomiting Anesthetic complications: no   No notable events documented.  Last Vitals:  Vitals:   01/24/23 1241 01/24/23 1340  BP: (!) 136/53 (!) 106/36  Pulse: 80 89  Resp: (!) 27 (!) 29  Temp: (!) 36.2 C (!) 36.2 C  SpO2: 100% 100%    Last Pain:  Vitals:   01/24/23 1340  TempSrc: Temporal  PainSc: 0-No pain                 Collene Schlichter

## 2023-01-24 NOTE — Discharge Instructions (Signed)
YOU HAD AN ENDOSCOPIC PROCEDURE TODAY: Refer to the procedure report and other information in the discharge instructions given to you for any specific questions about what was found during the examination. If this information does not answer your questions, please call  office at [phone] to clarify.   YOU SHOULD EXPECT: Some feelings of bloating in the abdomen. Passage of more gas than usual. Walking can help get rid of the air that was put into your GI tract during the procedure and reduce the bloating. If you had a lower endoscopy (such as a colonoscopy or flexible sigmoidoscopy) you may notice spotting of blood in your stool or on the toilet paper. Some abdominal soreness may be present for a day or two, also.  DIET: Your first meal following the procedure should be a light meal and then it is ok to progress to your normal diet. A half-sandwich or bowl of soup is an example of a good first meal. Heavy or fried foods are harder to digest and may make you feel nauseous or bloated. Drink plenty of fluids but you should avoid alcoholic beverages for 24 hours. If you had a esophageal dilation, please see attached instructions for diet.    ACTIVITY: Your care partner should take you home directly after the procedure. You should plan to take it easy, moving slowly for the rest of the day. You can resume normal activity the day after the procedure however YOU SHOULD NOT DRIVE, use power tools, machinery or perform tasks that involve climbing or major physical exertion for 24 hours (because of the sedation medicines used during the test).   SYMPTOMS TO REPORT IMMEDIATELY: A gastroenterologist can be reached at any hour. Please call [phone]  for any of the following symptoms:  Following lower endoscopy (colonoscopy, flexible sigmoidoscopy) Excessive amounts of blood in the stool  Significant tenderness, worsening of abdominal pains  Swelling of the abdomen that is new, acute  Fever of 100 or  higher   FOLLOW UP:  If any biopsies were taken you will be contacted by phone or by letter within the next 1-3 weeks. Call [phone]  if you have not heard about the biopsies in 3 weeks.  Please also call with any specific questions about appointments or follow up tests.  

## 2023-01-28 ENCOUNTER — Encounter (HOSPITAL_COMMUNITY): Payer: Self-pay | Admitting: Gastroenterology

## 2023-01-30 ENCOUNTER — Other Ambulatory Visit: Payer: BC Managed Care – PPO

## 2023-01-31 LAB — SURGICAL PATHOLOGY

## 2023-02-01 ENCOUNTER — Encounter: Payer: BC Managed Care – PPO | Admitting: Gastroenterology

## 2023-02-01 ENCOUNTER — Other Ambulatory Visit: Payer: Self-pay

## 2023-02-01 ENCOUNTER — Encounter: Payer: Self-pay | Admitting: Gastroenterology

## 2023-02-01 DIAGNOSIS — K50918 Crohn's disease, unspecified, with other complication: Secondary | ICD-10-CM

## 2023-02-06 ENCOUNTER — Other Ambulatory Visit (INDEPENDENT_AMBULATORY_CARE_PROVIDER_SITE_OTHER): Payer: Self-pay | Admitting: Adult Health

## 2023-02-11 ENCOUNTER — Other Ambulatory Visit: Payer: BC Managed Care – PPO

## 2023-02-11 DIAGNOSIS — K50918 Crohn's disease, unspecified, with other complication: Secondary | ICD-10-CM

## 2023-02-14 LAB — CALPROTECTIN, FECAL: Calprotectin, Fecal: 88 ug/g (ref 0–120)

## 2023-02-15 ENCOUNTER — Ambulatory Visit: Payer: BC Managed Care – PPO

## 2023-02-18 ENCOUNTER — Ambulatory Visit (INDEPENDENT_AMBULATORY_CARE_PROVIDER_SITE_OTHER): Payer: BC Managed Care – PPO | Admitting: Adult Health

## 2023-02-18 ENCOUNTER — Encounter (INDEPENDENT_AMBULATORY_CARE_PROVIDER_SITE_OTHER): Payer: Self-pay | Admitting: Adult Health

## 2023-02-18 VITALS — BP 120/77 | HR 82 | Temp 98.1°F | Ht 62.0 in | Wt 278.0 lb

## 2023-02-18 DIAGNOSIS — Z6841 Body Mass Index (BMI) 40.0 and over, adult: Secondary | ICD-10-CM | POA: Diagnosis not present

## 2023-02-18 DIAGNOSIS — E669 Obesity, unspecified: Secondary | ICD-10-CM | POA: Diagnosis not present

## 2023-02-18 DIAGNOSIS — E559 Vitamin D deficiency, unspecified: Secondary | ICD-10-CM

## 2023-02-18 DIAGNOSIS — R7303 Prediabetes: Secondary | ICD-10-CM | POA: Diagnosis not present

## 2023-02-18 MED ORDER — METFORMIN HCL 500 MG PO TABS
ORAL_TABLET | ORAL | 0 refills | Status: DC
Start: 2023-02-18 — End: 2023-03-20

## 2023-02-18 MED ORDER — VITAMIN D (ERGOCALCIFEROL) 1.25 MG (50000 UNIT) PO CAPS: 50000.0000 [IU] | ORAL_CAPSULE | ORAL | 0 refills | Status: DC

## 2023-02-18 NOTE — Progress Notes (Signed)
WEIGHT SUMMARY AND BIOMETRICS  Vitals Temp: 98.1 F (36.7 C) BP: 120/77 Pulse Rate: 82 SpO2: 95 %   Anthropometric Measurements Height: 5\' 2"  (1.575 m) Weight: 278 lb (126.1 kg) BMI (Calculated): 50.83 Weight at Last Visit: 282lb Weight Lost Since Last Visit: 4lb Weight Gained Since Last Visit: 0 Starting Weight: 293lb Total Weight Loss (lbs): 15 lb (6.804 kg) Peak Weight: 293lb   Body Composition  Body Fat %: 52.5 % Fat Mass (lbs): 146.2 lbs Muscle Mass (lbs): 125.6 lbs Total Body Water (lbs): 103.4 lbs Visceral Fat Rating : 18   Other Clinical Data Fasting: yes Labs: no Today's Visit #: 34 Starting Date: 10/02/17    Chief Complaint:   OBESITY [name] is here to discuss her progress with her obesity treatment plan. She is on the keeping a food journal and adhering to recommended goals of 1300-1500 calories and 100+ protein and states she is following her eating plan approximately 70 % of the time. Shestates she is exercising -daily walking, a least 6,000 steps/day   Interim History:  Reviewed Bioimpedance results with pt: Muscle Mass: +1.6 lbs Adipose Mass: -5.2 lbs   Sleep- she estimates to sleep 5-7 hrs/night  Exercise-she has been mindful of steps and walking at least 6K/day, goal is >10Kday  Hydration-she estimates to drink at least 100 oz/day  Subjective:   1. Vitamin D deficiency  Latest Reference Range & Units 10/17/22 08:29  Vitamin D, 25-Hydroxy 30.0 - 100.0 ng/mL 49.4   She is on on weekly Ergocalciferol- denies N/V/Muscle Weakness  2. Pre-diabetes Lab Results  Component Value Date   HGBA1C 5.7 (H) 10/17/2022   HGBA1C 5.6 06/13/2022   HGBA1C 5.8 (H) 12/26/2021    She is on Metformin 500mg  - 1 tab at lunch, 1 tab at dinner She reports good control of appetite with this timing of this medication. She denies GI upset.  Assessment/Plan:   1. Vitamin D deficiency Refill Vitamin D, Ergocalciferol, (DRISDOL) 1.25 MG (50000  UNIT) CAPS capsule Take 1 capsule (50,000 Units total) by mouth every 7 (seven) days. Dispense: 4 capsule, Refills: 0 ordered   2. Pre-diabetes Refill - metFORMIN (GLUCOPHAGE) 500 MG tablet; 1 full tab daily with breakfast and 1 at lunch  Dispense: 60 tablet; Refill: 0  3. Obesity, current BMI 50.83  Tremaine is currently in the action stage of change. As such, her goal is to continue with weight loss efforts. She has agreed to keeping a food journal and adhering to recommended goals of 1300-1500 calories and 100+ protein.   Exercise goals: All adults should avoid inactivity. Some physical activity is better than none, and adults who participate in any amount of physical activity gain some health benefits. Adults should also include muscle-strengthening activities that involve all major muscle groups on 2 or more days a week.  Behavioral modification strategies: increasing lean protein intake, decreasing simple carbohydrates, increasing vegetables, increasing water intake, no skipping meals, meal planning and cooking strategies, keeping healthy foods in the home, better snacking choices, planning for success, and keeping a strict food journal.  [name] has agreed to follow-up with our clinic in 4 weeks. She was informed of the importance of frequent follow-up visits to maximize her success with intensive lifestyle modifications for her multiple health conditions.   Check Fasting Labs at next OV  Objective:   Blood pressure 120/77, pulse 82, temperature 98.1 F (36.7 C), height 5\' 2"  (1.575 m), weight 278 lb (126.1 kg), SpO2 95%. Body mass index  is 50.85 kg/m.  General: Cooperative, alert, well developed, in no acute distress. HEENT: Conjunctivae and lids unremarkable. Cardiovascular: Regular rhythm.  Lungs: Normal work of breathing. Neurologic: No focal deficits.   Lab Results  Component Value Date   CREATININE 0.69 11/23/2022   BUN 12 11/23/2022   NA 137 11/23/2022   K 3.8  11/23/2022   CL 102 11/23/2022   CO2 28 11/23/2022   Lab Results  Component Value Date   ALT 10 11/23/2022   AST 14 11/23/2022   ALKPHOS 73 11/23/2022   BILITOT 0.4 11/23/2022   Lab Results  Component Value Date   HGBA1C 5.7 (H) 10/17/2022   HGBA1C 5.6 06/13/2022   HGBA1C 5.8 (H) 12/26/2021   HGBA1C 5.2 03/15/2021   HGBA1C 5.1 10/31/2020   Lab Results  Component Value Date   INSULIN 7.2 10/17/2022   INSULIN 11.5 06/13/2022   INSULIN 5.1 12/26/2021   INSULIN 4.4 03/15/2021   INSULIN 4.2 10/31/2020   Lab Results  Component Value Date   TSH 1.050 10/17/2022   Lab Results  Component Value Date   CHOL 151 12/19/2022   HDL 45.20 12/19/2022   LDLCALC 96 12/19/2022   TRIG 47.0 12/19/2022   CHOLHDL 3 12/19/2022   Lab Results  Component Value Date   VD25OH 49.4 10/17/2022   VD25OH 39.5 06/13/2022   VD25OH 37.53 02/07/2022   Lab Results  Component Value Date   WBC 11.9 (H) 01/14/2023   HGB 12.9 01/14/2023   HCT 40.2 01/14/2023   MCV 89.7 01/14/2023   PLT 318 01/14/2023   Lab Results  Component Value Date   IRON 38 01/14/2023   TIBC 290 01/14/2023   FERRITIN 13 01/14/2023   Attestation Statements:   Reviewed by clinician on day of visit: allergies, medications, problem list, medical history, surgical history, family history, social history, and previous encounter notes.  I have reviewed the above documentation for accuracy and completeness, and I agree with the above. -  Ever Halberg d. Kyl Givler, NP-C

## 2023-02-19 ENCOUNTER — Ambulatory Visit: Payer: BC Managed Care – PPO

## 2023-02-19 VITALS — BP 101/67 | HR 82 | Temp 96.9°F | Resp 18 | Ht 62.5 in | Wt 284.8 lb

## 2023-02-19 DIAGNOSIS — K5000 Crohn's disease of small intestine without complications: Secondary | ICD-10-CM | POA: Diagnosis not present

## 2023-02-19 DIAGNOSIS — Z8719 Personal history of other diseases of the digestive system: Secondary | ICD-10-CM | POA: Diagnosis not present

## 2023-02-19 MED ORDER — ACETAMINOPHEN 325 MG PO TABS: 650.0000 mg | ORAL_TABLET | Freq: Once | ORAL | Status: DC

## 2023-02-19 MED ORDER — SODIUM CHLORIDE 0.9 % IV SOLN
5.0000 mg/kg | INTRAVENOUS | Status: DC
  Administered 2023-02-19: 600 mg via INTRAVENOUS
  Filled 2023-02-19: qty 60

## 2023-02-19 MED ORDER — METHYLPREDNISOLONE SODIUM SUCC 40 MG IJ SOLR: 40.0000 mg | Freq: Once | INTRAMUSCULAR | Status: DC

## 2023-02-19 MED ORDER — DIPHENHYDRAMINE HCL 25 MG PO CAPS: 25.0000 mg | ORAL_CAPSULE | Freq: Once | ORAL | Status: DC

## 2023-02-19 NOTE — Progress Notes (Signed)
Diagnosis: Crohn's Disease  Provider:  Chilton Greathouse MD  Procedure: IV Infusion  IV Type: Peripheral, IV Location: L Antecubital  Avsola (infliximab-axxq), Dose: 600 mg  Infusion Start Time: 1339  Infusion Stop Time: 1555  Post Infusion IV Care: Peripheral IV Discontinued  Discharge: Condition: Good, Destination: Home . AVS Declined  Performed by:  Adriana Mccallum, RN

## 2023-02-25 ENCOUNTER — Telehealth: Payer: Self-pay | Admitting: Pharmacy Technician

## 2023-02-25 NOTE — Telephone Encounter (Signed)
Auth Submission: APPROVED Site of care: Site of care: [facility] Payer: [employer]BS Medication & CPT/J Code(s) submitted: Avsola (infliximab-axxq) [id number] Route of submission (phone, fax, portal):  Phone # Fax # Auth type: Buy/Bill PB Units/visits requested: 5MG /KG- 600mg  [id number] Reference number: [id number] Approval from: 02/20/23 to 02/20/24

## 2023-03-13 ENCOUNTER — Other Ambulatory Visit: Payer: Self-pay

## 2023-03-19 ENCOUNTER — Ambulatory Visit: Payer: BC Managed Care – PPO | Admitting: *Deleted

## 2023-03-19 VITALS — BP 108/70 | HR 84 | Temp 95.0°F | Resp 22 | Ht 62.5 in | Wt 287.6 lb

## 2023-03-19 DIAGNOSIS — Z8719 Personal history of other diseases of the digestive system: Secondary | ICD-10-CM

## 2023-03-19 DIAGNOSIS — K5000 Crohn's disease of small intestine without complications: Secondary | ICD-10-CM | POA: Diagnosis not present

## 2023-03-19 MED ORDER — DIPHENHYDRAMINE HCL 25 MG PO CAPS: 25.0000 mg | ORAL_CAPSULE | Freq: Once | ORAL | Status: DC

## 2023-03-19 MED ORDER — SODIUM CHLORIDE 0.9 % IV SOLN
5.0000 mg/kg | Freq: Once | INTRAVENOUS | Status: AC
  Administered 2023-03-19: 600 mg via INTRAVENOUS
  Filled 2023-03-19: qty 60

## 2023-03-19 MED ORDER — ACETAMINOPHEN 325 MG PO TABS: 650.0000 mg | ORAL_TABLET | Freq: Once | ORAL | Status: DC

## 2023-03-19 MED ORDER — METHYLPREDNISOLONE SODIUM SUCC 40 MG IJ SOLR: 40.0000 mg | Freq: Once | INTRAMUSCULAR | Status: DC

## 2023-03-19 NOTE — Progress Notes (Signed)
Diagnosis: Crohn's Disease  Provider:  Chilton Greathouse MD  Procedure: IV Infusion  IV Type: Peripheral, IV Location: L Antecubital  Avsola (infliximab-axxq), Dose: 600 mg  Infusion Start Time: 1344  Infusion Stop Time: 1555 pm  Post Infusion IV Care: Observation period completed and Peripheral IV Discontinued  Discharge: Condition: Good, Destination: Home . AVS Declined  Performed by:  Adriana Mccallum, RN

## 2023-03-20 ENCOUNTER — Ambulatory Visit (INDEPENDENT_AMBULATORY_CARE_PROVIDER_SITE_OTHER): Payer: BC Managed Care – PPO | Admitting: Adult Health

## 2023-03-20 ENCOUNTER — Encounter (INDEPENDENT_AMBULATORY_CARE_PROVIDER_SITE_OTHER): Payer: Self-pay | Admitting: Adult Health

## 2023-03-20 DIAGNOSIS — E669 Obesity, unspecified: Secondary | ICD-10-CM | POA: Diagnosis not present

## 2023-03-20 DIAGNOSIS — Z6841 Body Mass Index (BMI) 40.0 and over, adult: Secondary | ICD-10-CM

## 2023-03-20 DIAGNOSIS — E559 Vitamin D deficiency, unspecified: Secondary | ICD-10-CM | POA: Diagnosis not present

## 2023-03-20 DIAGNOSIS — R7303 Prediabetes: Secondary | ICD-10-CM | POA: Diagnosis not present

## 2023-03-20 MED ORDER — VITAMIN D (ERGOCALCIFEROL) 1.25 MG (50000 UNIT) PO CAPS: 50000.0000 [IU] | ORAL_CAPSULE | ORAL | 0 refills | Status: DC

## 2023-03-20 MED ORDER — METFORMIN HCL 500 MG PO TABS: ORAL_TABLET | ORAL | 0 refills | Status: DC

## 2023-03-20 NOTE — Progress Notes (Signed)
WEIGHT SUMMARY AND BIOMETRICS  Vitals Temp: 97.9 F (36.6 C) BP: 118/80 Pulse Rate: 84 SpO2: 97 %   Anthropometric Measurements Height: 5\' 2"  (1.575 m) Weight: 282 lb (127.9 kg) BMI (Calculated): 51.57 Weight at Last Visit: 278lb Weight Lost Since Last Visit: 0 Weight Gained Since Last Visit: 4lb Starting Weight: 293lb Total Weight Loss (lbs): 11 lb (4.99 kg)   Body Composition  Body Fat %: 52.3 % Fat Mass (lbs): 147.4 lbs Muscle Mass (lbs): 127.8 lbs Visceral Fat Rating : 18   Other Clinical Data Fasting: yes Labs: yes Today's Visit #: 39 Starting Date: 10/02/17    Chief Complaint:   OBESITY [name] is here to discuss her progress with her obesity treatment plan. She is on the keeping a food journal and adhering to recommended goals of 1300-1500 calories and 100+ protein and states she is following her eating plan approximately 65 % of the time. She states she is not currently exercising.   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 She has lunch daily from 1200-1225 She has a small refrigerator and crock pot in her room. She does not have access to a microwave  Sleep- 5-6 hrs/nightly.  She reports that her 2 yr old daughter is sleeping through the night.  Exercise-none- limited by time.  Hydration-She estimates to drink at least 90 oz water/day- great!  Subjective:   1. Vitamin D deficiency  Latest Reference Range & Units 10/17/22 08:29  Vitamin D, 25-Hydroxy 30.0 - 100.0 ng/mL 49.4   She reports fatigue She estimates to sleep 5-6 hrs/night She is not currently exercising. She estimates to drink at least 90 oz water/day- great! She is on weekly Ergocalciferol  2. Pre-diabetes Lab Results  Component Value Date   HGBA1C 5.7 (H) 10/17/2022   HGBA1C 5.6 06/13/2022   HGBA1C 5.8 (H) 12/26/2021    She is on Metformin 500mg  at breakfast and lunch She denies GI  upset   Assessment/Plan:   1. Vitamin D deficiency Check Labs - VITAMIN D 25 Hydroxy (Vit-D Deficiency, Fractures) Refill Vitamin D, Ergocalciferol, (DRISDOL) 1.25 MG (50000 UNIT) CAPS capsule Take 1 capsule (50,000 Units total) by mouth every 7 (seven) days. Dispense: 4 capsule, Refills: 0 ordered   2. Pre-diabetes Check Labs - Comprehensive metabolic panel - Hemoglobin A1c - Magnesium - Vitamin B12 - Insulin, random Refill - metFORMIN (GLUCOPHAGE) 500 MG tablet; 1 full tab daily with breakfast and 1 at lunch  Dispense: 60 tablet; Refill: 0  3. Obesity, current BMI 51.57  [name] is currently in the action stage of change. As such, her goal is to continue with weight loss efforts. She has agreed to keeping a food journal and adhering to recommended goals of 1300-1500 calories and 100+ protein.   1) Resume consistent tracking of intake 2) Try to meal plan/prep on Sunday for the week 3) Measure out CHO serving AFTER cooking and keep to 1 cup at dinner (ie; rice, pasta)  Exercise goals: All adults should avoid inactivity. Some physical activity is better than none, and adults who participate in any amount of physical activity gain some health benefits. Adults should also include muscle-strengthening activities that involve all major muscle groups on 2 or more days a week.  Behavioral modification strategies: increasing lean protein intake, decreasing simple carbohydrates, increasing vegetables, increasing water intake, no skipping meals, meal planning and cooking strategies, keeping healthy foods in the home,  planning for success, and keeping a strict food journal.  [name] has agreed to follow-up with our clinic in 4 weeks. She was informed of the importance of frequent follow-up visits to maximize her success with intensive lifestyle modifications for her multiple health conditions.   [name] was informed we would discuss her lab results at her next visit unless there is a critical  issue that needs to be addressed sooner. [name] agreed to keep her next visit at the agreed upon time to discuss these results.  Objective:   Blood pressure 118/80, pulse 84, temperature 97.9 F (36.6 C), height 5\' 2"  (1.575 m), weight 282 lb (127.9 kg), SpO2 97%. Body mass index is 51.58 kg/m.  General: Cooperative, alert, well developed, in no acute distress. HEENT: Conjunctivae and lids unremarkable. Cardiovascular: Regular rhythm.  Lungs: Normal work of breathing. Neurologic: No focal deficits.   Lab Results  Component Value Date   CREATININE 0.69 11/23/2022   BUN 12 11/23/2022   NA 137 11/23/2022   K 3.8 11/23/2022   CL 102 11/23/2022   CO2 28 11/23/2022   Lab Results  Component Value Date   ALT 10 11/23/2022   AST 14 11/23/2022   ALKPHOS 73 11/23/2022   BILITOT 0.4 11/23/2022   Lab Results  Component Value Date   HGBA1C 5.7 (H) 10/17/2022   HGBA1C 5.6 06/13/2022   HGBA1C 5.8 (H) 12/26/2021   HGBA1C 5.2 03/15/2021   HGBA1C 5.1 10/31/2020   Lab Results  Component Value Date   INSULIN 7.2 10/17/2022   INSULIN 11.5 06/13/2022   INSULIN 5.1 12/26/2021   INSULIN 4.4 03/15/2021   INSULIN 4.2 10/31/2020   Lab Results  Component Value Date   TSH 1.050 10/17/2022   Lab Results  Component Value Date   CHOL 151 12/19/2022   HDL 45.20 12/19/2022   LDLCALC 96 12/19/2022   TRIG 47.0 12/19/2022   CHOLHDL 3 12/19/2022   Lab Results  Component Value Date   VD25OH 49.4 10/17/2022   VD25OH 39.5 06/13/2022   VD25OH 37.53 02/07/2022   Lab Results  Component Value Date   WBC 11.9 (H) 01/14/2023   HGB 12.9 01/14/2023   HCT 40.2 01/14/2023   MCV 89.7 01/14/2023   PLT 318 01/14/2023   Lab Results  Component Value Date   IRON 38 01/14/2023   TIBC 290 01/14/2023   FERRITIN 13 01/14/2023   Attestation Statements:   Reviewed by clinician on day of visit: allergies, medications, problem list, medical history, surgical history, family history, social history,  and previous encounter notes.  I have reviewed the above documentation for accuracy and completeness, and I agree with the above. -  Luciann Gossett d. Bolivar Koranda, NP-C

## 2023-03-21 ENCOUNTER — Encounter: Payer: Self-pay | Admitting: Internal Medicine

## 2023-03-21 LAB — COMPREHENSIVE METABOLIC PANEL
ALT: 12 [IU]/L (ref 0–32)
AST: 15 [IU]/L (ref 0–40)
Albumin: 3.7 g/dL — ABNORMAL LOW (ref 3.9–4.9)
Alkaline Phosphatase: 84 [IU]/L (ref 44–121)
BUN/Creatinine Ratio: 16 (ref 9–23)
BUN: 12 mg/dL (ref 6–20)
Bilirubin Total: 0.2 mg/dL (ref 0.0–1.2)
CO2: 27 mmol/L (ref 20–29)
Calcium: 9.3 mg/dL (ref 8.7–10.2)
Chloride: 101 mmol/L (ref 96–106)
Creatinine, Ser: 0.73 mg/dL (ref 0.57–1.00)
Globulin, Total: 3.4 g/dL (ref 1.5–4.5)
Glucose: 85 mg/dL (ref 70–99)
Potassium: 4.5 mmol/L (ref 3.5–5.2)
Sodium: 138 mmol/L (ref 134–144)
Total Protein: 7.1 g/dL (ref 6.0–8.5)
eGFR: 107 mL/min/{1.73_m2} (ref >59)

## 2023-03-21 LAB — INSULIN, RANDOM: INSULIN: 7.1 u[IU]/mL (ref 2.6–24.9)

## 2023-03-21 LAB — HEMOGLOBIN A1C
Est. average glucose Bld gHb Est-mCnc: 123 mg/dL
Hgb A1c MFr Bld: 5.9 % — ABNORMAL HIGH (ref 4.8–5.6)

## 2023-03-21 LAB — VITAMIN D 25 HYDROXY (VIT D DEFICIENCY, FRACTURES): Vit D, 25-Hydroxy: 66.5 ng/mL (ref 30.0–100.0)

## 2023-03-21 LAB — MAGNESIUM: Magnesium: 2 mg/dL (ref 1.6–2.3)

## 2023-03-21 LAB — VITAMIN B12: Vitamin B-12: 727 pg/mL (ref 232–1245)

## 2023-04-04 ENCOUNTER — Telehealth: Payer: Self-pay | Admitting: Internal Medicine

## 2023-04-04 DIAGNOSIS — M722 Plantar fascial fibromatosis: Secondary | ICD-10-CM

## 2023-04-04 NOTE — Telephone Encounter (Signed)
Pt is calling back and want a call back.

## 2023-04-04 NOTE — Telephone Encounter (Signed)
Attempted to reach pt. Left a voicemail to call back.

## 2023-04-05 NOTE — Telephone Encounter (Signed)
A referral is placed.  

## 2023-04-05 NOTE — Telephone Encounter (Signed)
Spoke to pt.   Pt reports last time she was seen for cpe in July. She had talk to Dr. Fabian Sharp about her plantar fasciitis. She states she was told if sx not getting better, to update [name] and a referral to sport medicine can place.  Ok to place a sport medicine referral? Please advise  Pt is aware, provider is out and will be back on Tuesday.

## 2023-04-05 NOTE — Telephone Encounter (Signed)
Attempted to reach pt. Left a detail message to send [name] a mychart of her concerns or tell us what are her concerns when call back.

## 2023-04-05 NOTE — Telephone Encounter (Signed)
Yes please refer  to sports medicine for PF

## 2023-04-11 NOTE — Progress Notes (Signed)
[name] D.[facility] [address] [place] [address] Phone: [phone]   Assessment and Plan:     1. Plantar fasciitis of right foot - Chronic with exacerbation, initial sports medicine visit - Most consistent with plantar fasciitis based on HPI, physical exam - Patient cannot tolerate NSAIDs due to history of Crohn's disease and GI bleed - Patient elected for plan fasciitis CSI.  Tolerated well per note below - Start HEP for plantar fasciitis - May use Tylenol for day-to-day pain relief  Procedure: Plantar Fascia Origin Injection  Side: Right Diagnosis: Plantar fasciitis   After explaining the procedure, viable alternatives, risks, and answering any questions, consent was given verbally.  We specifically discussed the risk of fat pad atrophy.  The site was cleaned with alcohol prep.  A steroid injection was performed using 1 ml of 1% lidocaine without epinephrine and 40 mg of Kenalog 40 mg/ml deep to the fascia as to avoid the fat pad. This was well tolerated and resulted in relief.  Needle was removed and dressing placed and post injection instructions were given including a discussion of likely return of pain today after the anesthetic wears off (with the possibility of worsened pain) until the steroid starts to work in 1-3 days.   Pt was advised to call or return to clinic if these symptoms worsen or fail to improve as anticipated.    Pertinent previous records reviewed include none  Follow Up: 4 weeks for reevaluation.  Could discuss physical therapy versus [facility] if no improvement    Subjective:   I, [name], am serving as a Neurosurgeon for Doctor Richardean Sale  Chief Complaint: bilat foot pain   HPI:   04/12/2023 Patient is a 42  year old female with bilat foot pain. Patient states that her pain started in January. Rolled her feet out and treated it as plantar fascitis. Has bought new shoes and those didn't help much.  Pain is worse at night, and when she gets up from a seated position. Has tried RICES. Sitting helps relief the pain. No meds for the pain. No numbness and tingling. No radiating pain .    Relevant Historical Information: History of GI bleed, Crohn's disease, elevated BMI  Additional pertinent review of systems negative.   Current Outpatient Medications:    Calcium Carb-Cholecalciferol (CALCIUM/VITAMIN D) 600-400 MG-UNIT TABS, Take 1 tablet by mouth at bedtime., Disp: , Rfl:    ferrous gluconate (FERGON) 324 MG tablet, TAKE 1 TABLET BY MOUTH DAILY WITH BREAKFAST, Disp: 90 tablet, Rfl: 1   fexofenadine (ALLEGRA) 180 MG tablet, Take 180 mg by mouth at bedtime., Disp: , Rfl:    inFLIXimab-axxq (AVSOLA) 100 MG injection, Inject 1 mg into the vein. Every month, Disp: , Rfl:    metFORMIN (GLUCOPHAGE) 500 MG tablet, 1 full tab daily with breakfast and 1 at lunch, Disp: 60 tablet, Rfl: 0   Prenatal Vit-Fe Fumarate-FA (PRENATAL MULTIVITAMIN) TABS tablet, Take 1 tablet by mouth at bedtime., Disp: , Rfl:    Vitamin D, Ergocalciferol, (DRISDOL) 1.25 MG (50000 UNIT) CAPS capsule, Take 1 capsule (50,000 Units total) by mouth every 7 (seven) days., Disp: 4 capsule, Rfl: 0 No current facility-administered medications for this visit.  Facility-Administered Medications Ordered in Other Visits:    acetaminophen (TYLENOL) tablet 650 mg, 650 mg, Oral, Once, Mansouraty, Netty Starring., MD   diphenhydrAMINE (BENADRYL) capsule 25 mg, 25 mg, Oral, Once, Mansouraty, Netty Starring., MD   methylPREDNISolone sodium succinate (SOLU-MEDROL)  40 mg/mL injection 40 mg, 40 mg, Intravenous, Once, Mansouraty, Netty Starring., MD   Objective:     Vitals:   04/12/23 0936  BP: 118/82  Pulse: 95  SpO2: 97%  Weight: 289 lb (131.1 kg)  Height: 5\' 2"  (1.575 m)      Body mass index is 52.86 kg/m.    Physical Exam:    Gen: Appears well, nad, nontoxic and pleasant Psych: Alert and oriented, appropriate mood and affect Neuro:  sensation intact, strength is 5/5 with df/pf/inv/ev, muscle tone wnl Skin: no susupicious lesions or rashes  Right foot/ankle:  No deformity, no swelling or effusion TTP calcaneal fat pad, medial anterior calcaneus NTTP over fibular head, lat mal, medial mal, achilles, navicular, base of 5th, ATFL, CFL, deltoid, posterior calcaneous or midfoot ROM DF 30, PF 45, inv/ev intact Negative ant drawer, talar tilt, rotation test, squeeze test. Neg thompson No pain with resisted inversion or eversion    Electronically signed by:  [name] D.[facility] 9:57 AM 04/12/23

## 2023-04-12 ENCOUNTER — Ambulatory Visit: Payer: BC Managed Care – PPO | Admitting: Sports Medicine

## 2023-04-12 VITALS — BP 118/82 | HR 95 | Ht 62.0 in | Wt 289.0 lb

## 2023-04-12 DIAGNOSIS — M722 Plantar fascial fibromatosis: Secondary | ICD-10-CM | POA: Diagnosis not present

## 2023-04-12 NOTE — Patient Instructions (Addendum)
PF exercises Continue to use tennis shoes and inserts See me again in 4 weeks

## 2023-04-15 ENCOUNTER — Other Ambulatory Visit (INDEPENDENT_AMBULATORY_CARE_PROVIDER_SITE_OTHER): Payer: Self-pay | Admitting: Adult Health

## 2023-04-16 ENCOUNTER — Ambulatory Visit: Payer: BC Managed Care – PPO | Admitting: *Deleted

## 2023-04-16 VITALS — BP 111/74 | HR 91 | Temp 96.0°F | Resp 16 | Ht 66.0 in | Wt 285.8 lb

## 2023-04-16 DIAGNOSIS — Z8719 Personal history of other diseases of the digestive system: Secondary | ICD-10-CM

## 2023-04-16 DIAGNOSIS — K5000 Crohn's disease of small intestine without complications: Secondary | ICD-10-CM

## 2023-04-16 MED ORDER — DIPHENHYDRAMINE HCL 25 MG PO CAPS: 25.0000 mg | ORAL_CAPSULE | Freq: Once | ORAL | Status: DC

## 2023-04-16 MED ORDER — ACETAMINOPHEN 325 MG PO TABS: 650.0000 mg | ORAL_TABLET | Freq: Once | ORAL | Status: DC

## 2023-04-16 MED ORDER — METHYLPREDNISOLONE SODIUM SUCC 40 MG IJ SOLR: 40.0000 mg | Freq: Once | INTRAMUSCULAR | Status: DC

## 2023-04-16 MED ORDER — SODIUM CHLORIDE 0.9 % IV SOLN
5.0000 mg/kg | Freq: Once | INTRAVENOUS | Status: AC
  Administered 2023-04-16: 700 mg via INTRAVENOUS
  Filled 2023-04-16: qty 70

## 2023-04-16 NOTE — Progress Notes (Signed)
Diagnosis: Crohn's Disease  Provider:  Chilton Greathouse MD  Procedure: IV Infusion  IV Type: Peripheral, IV Location: L Forearm  Avsola (infliximab-axxq), Dose: 700 mg  Infusion Start Time: 1336 pm  Infusion Stop Time: 1550 pm  Post Infusion IV Care: Observation period completed and Peripheral IV Discontinued  Discharge: Condition: Good, Destination: Home . AVS Declined  Performed by:  Forrest Moron, RN

## 2023-04-24 ENCOUNTER — Other Ambulatory Visit: Payer: Self-pay | Admitting: *Deleted

## 2023-04-24 DIAGNOSIS — D509 Iron deficiency anemia, unspecified: Secondary | ICD-10-CM

## 2023-04-29 ENCOUNTER — Inpatient Hospital Stay: Payer: BC Managed Care – PPO | Attending: Hematology and Oncology

## 2023-04-29 ENCOUNTER — Other Ambulatory Visit: Payer: BC Managed Care – PPO

## 2023-04-29 DIAGNOSIS — D509 Iron deficiency anemia, unspecified: Secondary | ICD-10-CM

## 2023-04-29 DIAGNOSIS — D649 Anemia, unspecified: Secondary | ICD-10-CM | POA: Insufficient documentation

## 2023-04-29 LAB — CBC WITH DIFFERENTIAL (CANCER CENTER ONLY)
Abs Immature Granulocytes: 0.08 10*3/uL — ABNORMAL HIGH (ref 0.00–0.07)
Basophils Absolute: 0.1 10*3/uL (ref 0.0–0.1)
Basophils Relative: 1 %
Eosinophils Absolute: 0.2 10*3/uL (ref 0.0–0.5)
Eosinophils Relative: 1 %
HCT: 42.8 % (ref 36.0–46.0)
Hemoglobin: 13.8 g/dL (ref 12.0–15.0)
Immature Granulocytes: 1 %
Lymphocytes Relative: 22 %
Lymphs Abs: 2.6 10*3/uL (ref 0.7–4.0)
MCH: 30.3 pg (ref 26.0–34.0)
MCHC: 32.2 g/dL (ref 30.0–36.0)
MCV: 93.9 fL (ref 80.0–100.0)
Monocytes Absolute: 1 10*3/uL (ref 0.1–1.0)
Monocytes Relative: 9 %
Neutro Abs: 7.9 10*3/uL — ABNORMAL HIGH (ref 1.7–7.7)
Neutrophils Relative %: 66 %
Platelet Count: 327 10*3/uL (ref 150–400)
RBC: 4.56 MIL/uL (ref 3.87–5.11)
RDW: 15.4 % (ref 11.5–15.5)
WBC Count: 11.8 10*3/uL — ABNORMAL HIGH (ref 4.0–10.5)
nRBC: 0 % (ref 0.0–0.2)

## 2023-04-29 LAB — IRON AND IRON BINDING CAPACITY (CC-WL,HP ONLY)
Iron: 55 ug/dL (ref 28–170)
Saturation Ratios: 19 % (ref 10.4–31.8)
TIBC: 291 ug/dL (ref 250–450)
UIBC: 236 ug/dL (ref 148–442)

## 2023-04-29 LAB — CMP (CANCER CENTER ONLY)
ALT: 14 U/L (ref 0–44)
AST: 15 U/L (ref 15–41)
Albumin: 3.6 g/dL (ref 3.5–5.0)
Alkaline Phosphatase: 82 U/L (ref 38–126)
Anion gap: 5 (ref 5–15)
BUN: 19 mg/dL (ref 6–20)
CO2: 29 mmol/L (ref 22–32)
Calcium: 8.7 mg/dL — ABNORMAL LOW (ref 8.9–10.3)
Chloride: 103 mmol/L (ref 98–111)
Creatinine: 0.7 mg/dL (ref 0.44–1.00)
GFR, Estimated: >60 mL/min (ref >60)
Glucose, Bld: 103 mg/dL — ABNORMAL HIGH (ref 70–99)
Potassium: 3.9 mmol/L (ref 3.5–5.1)
Sodium: 137 mmol/L (ref 135–145)
Total Bilirubin: 0.3 mg/dL (ref <1.2)
Total Protein: 7.4 g/dL (ref 6.5–8.1)

## 2023-05-02 ENCOUNTER — Encounter (INDEPENDENT_AMBULATORY_CARE_PROVIDER_SITE_OTHER): Payer: Self-pay | Admitting: Adult Health

## 2023-05-02 ENCOUNTER — Ambulatory Visit (INDEPENDENT_AMBULATORY_CARE_PROVIDER_SITE_OTHER): Payer: BC Managed Care – PPO | Admitting: Adult Health

## 2023-05-02 VITALS — BP 100/65 | HR 83 | Temp 97.5°F | Ht 62.0 in | Wt 284.0 lb

## 2023-05-02 DIAGNOSIS — E66813 Obesity, class 3: Secondary | ICD-10-CM

## 2023-05-02 DIAGNOSIS — Z6841 Body Mass Index (BMI) 40.0 and over, adult: Secondary | ICD-10-CM

## 2023-05-02 DIAGNOSIS — E559 Vitamin D deficiency, unspecified: Secondary | ICD-10-CM | POA: Diagnosis not present

## 2023-05-02 DIAGNOSIS — E669 Obesity, unspecified: Secondary | ICD-10-CM

## 2023-05-02 DIAGNOSIS — M722 Plantar fascial fibromatosis: Secondary | ICD-10-CM | POA: Diagnosis not present

## 2023-05-02 DIAGNOSIS — R7303 Prediabetes: Secondary | ICD-10-CM | POA: Diagnosis not present

## 2023-05-02 DIAGNOSIS — Z0000 Encounter for general adult medical examination without abnormal findings: Secondary | ICD-10-CM

## 2023-05-02 MED ORDER — VITAMIN D (ERGOCALCIFEROL) 1.25 MG (50000 UNIT) PO CAPS: 50000.0000 [IU] | ORAL_CAPSULE | ORAL | 0 refills | Status: DC

## 2023-05-02 MED ORDER — METFORMIN HCL 500 MG PO TABS: ORAL_TABLET | ORAL | 0 refills | Status: DC

## 2023-05-02 NOTE — Progress Notes (Signed)
WEIGHT SUMMARY AND BIOMETRICS  Vitals Temp: (!) 97.5 F (36.4 C) BP: 100/65 Pulse Rate: 83 SpO2: 95 %   Anthropometric Measurements Height: 5\' 2"  (1.575 m) Weight: 284 lb (128.8 kg) BMI (Calculated): 51.93 Weight at Last Visit: 282lb Weight Lost Since Last Visit: 0 Weight Gained Since Last Visit: 2lb Starting Weight: 293lb Total Weight Loss (lbs): 9 lb (4.082 kg)   Body Composition  Body Fat %: 52.8 % Fat Mass (lbs): 150 lbs Muscle Mass (lbs): 127.4 lbs Total Body Water (lbs): 104.4 lbs Visceral Fat Rating : 19   Other Clinical Data Fasting: yes Labs: no Today's Visit #: 29 Starting Date: 10/02/17    Chief Complaint:   OBESITY [name] is here to discuss her progress with her obesity treatment plan. She is on the keeping a food journal and adhering to recommended goals of 1300-1500 calories and 100+ protein and states she is following her eating plan approximately 50 % of the time. She states she is exercising- NONE   Interim History:   2025 Health Goals 1) Restart Journaling 2) Increase daily activity/exercise- walking pad will be delivered Monday 12/9 3) Improve sleep, ie: going to bed earlier and consistently at same time each evening.   Subjective:   1. Plantar fasciitis, right 04/12/2023 Sports Med Ov Notes Assessment and Plan:     1. Plantar fasciitis of right foot - Chronic with exacerbation, initial sports medicine visit - Most consistent with plantar fasciitis based on HPI, physical exam - Patient cannot tolerate NSAIDs due to history of Crohn's disease and GI bleed - Patient elected for plan fasciitis CSI.  Tolerated well per note below - Start HEP for plantar fasciitis - May use Tylenol for day-to-day pain relief   Procedure: Plantar Fascia Origin Injection  Side: Right Diagnosis: Plantar fasciitis   After explaining the procedure, viable alternatives, risks, and answering any questions, consent was given verbally.  We specifically  discussed the risk of fat pad atrophy.  The site was cleaned with alcohol prep.  A steroid injection was performed using 1 ml of 1% lidocaine without epinephrine and 40 mg of Kenalog 40 mg/ml deep to the fascia as to avoid the fat pad. This was well tolerated and resulted in relief.  Needle was removed and dressing placed and post injection instructions were given including a discussion of likely return of pain today after the anesthetic wears off (with the possibility of worsened pain) until the steroid starts to work in 1-3 days.   Pt was advised to call or return to clinic if these symptoms worsen or fail to improve as anticipated.    Pertinent previous records reviewed include none   Follow Up: 4 weeks for reevaluation.  Could discuss physical therapy versus ECSWT if no improvement    2. Healthcare maintenance Discussed Labs 03/20/2023 CMP- Ca++ slightly low at 8.7 Kidney and liver enzymes, normal Mg++ level: 2 , normal  Latest Reference Range & Units 03/20/23 08:36  Vitamin B12 232 - 1,245 pg/mL 727   B12 at goal  3. Pre-diabetes Discussed Labs  Latest Reference Range & Units 03/20/23 08:36  Glucose 70 - 99 mg/dL 85  Hemoglobin Z6X 4.8 - 5.6 % 5.9 (H)  Est. average glucose Bld gHb Est-mCnc mg/dL 096  INSULIN 2.6 - 04.5 uIU/mL 7.1  (H): Data is abnormally high  03/20/2023 CMP- GFR >60  Insulin level slightly improved, with worsening A1c She is on Metformin 500mg  BID with lunch and dinner  4. Vitamin  D deficiency Discussed Labs  Latest Reference Range & Units 03/20/23 08:36  Vitamin D, 25-Hydroxy 30.0 - 100.0 ng/mL 66.5   Level at goal She is on weekly Ergocalciferol- denies N/V/Muscle Weakness  Assessment/Plan:   1. Plantar fasciitis, right Advance activity as tolerated. Keep follow-up with Sports Med tomorrow  2. Healthcare maintenance Continue OTC supplementation and decreased Ergocalciferol  3. Pre-diabetes Refill metFORMIN (GLUCOPHAGE) 500 MG tablet 1 full  tab daily with breakfast and 1 at lunch Dispense: 60 tablet, Refills: 0 ordered   4. Vitamin D deficiency Refill and reduce Vitamin D, Ergocalciferol, (DRISDOL) 1.25 MG (50000 UNIT) CAPS capsule Take 1 capsule (50,000 Units total) by mouth every 14 (fourteen) days. Dispense: 8 capsule, Refills: 0 ordered   5. Obesity, current BMI 51.93  [name] is currently in the action stage of change. As such, her goal is to continue with weight loss efforts. She has agreed to keeping a food journal and adhering to recommended goals of 1300-1500 calories and 100+ protein.   Exercise goals: All adults should avoid inactivity. Some physical activity is better than none, and adults who participate in any amount of physical activity gain some health benefits. Adults should also include muscle-strengthening activities that involve all major muscle groups on 2 or more days a week.  Behavioral modification strategies: increasing lean protein intake, decreasing simple carbohydrates, increasing vegetables, increasing water intake, no skipping meals, meal planning and cooking strategies, keeping healthy foods in the home, planning for success, and keeping a strict food journal.  [name] has agreed to follow-up with our clinic in 4 weeks. She was informed of the importance of frequent follow-up visits to maximize her success with intensive lifestyle modifications for her multiple health conditions.   Objective:   Blood pressure 100/65, pulse 83, temperature (!) 97.5 F (36.4 C), height 5\' 2"  (1.575 m), weight 284 lb (128.8 kg), last menstrual period 04/27/2023, SpO2 95%. Body mass index is 51.94 kg/m.  General: Cooperative, alert, well developed, in no acute distress. HEENT: Conjunctivae and lids unremarkable. Cardiovascular: Regular rhythm.  Lungs: Normal work of breathing. Neurologic: No focal deficits.   Lab Results  Component Value Date   CREATININE 0.70 04/29/2023   BUN 19 04/29/2023   NA 137 04/29/2023    K 3.9 04/29/2023   CL 103 04/29/2023   CO2 29 04/29/2023   Lab Results  Component Value Date   ALT 14 04/29/2023   AST 15 04/29/2023   ALKPHOS 82 04/29/2023   BILITOT 0.3 04/29/2023   Lab Results  Component Value Date   HGBA1C 5.9 (H) 03/20/2023   HGBA1C 5.7 (H) 10/17/2022   HGBA1C 5.6 06/13/2022   HGBA1C 5.8 (H) 12/26/2021   HGBA1C 5.2 03/15/2021   Lab Results  Component Value Date   INSULIN 7.1 03/20/2023   INSULIN 7.2 10/17/2022   INSULIN 11.5 06/13/2022   INSULIN 5.1 12/26/2021   INSULIN 4.4 03/15/2021   Lab Results  Component Value Date   TSH 1.050 10/17/2022   Lab Results  Component Value Date   CHOL 151 12/19/2022   HDL 45.20 12/19/2022   LDLCALC 96 12/19/2022   TRIG 47.0 12/19/2022   CHOLHDL 3 12/19/2022   Lab Results  Component Value Date   VD25OH 66.5 03/20/2023   VD25OH 49.4 10/17/2022   VD25OH 39.5 06/13/2022   Lab Results  Component Value Date   WBC 11.8 (H) 04/29/2023   HGB 13.8 04/29/2023   HCT 42.8 04/29/2023   MCV 93.9 04/29/2023   PLT 327 04/29/2023  Lab Results  Component Value Date   IRON 55 04/29/2023   TIBC 291 04/29/2023   FERRITIN 13 01/14/2023    Attestation Statements:   Reviewed by clinician on day of visit: allergies, medications, problem list, medical history, surgical history, family history, social history, and previous encounter notes.  I have reviewed the above documentation for accuracy and completeness, and I agree with the above. -  Joshuajames Moehring d. Takota Cahalan, NP-C

## 2023-05-08 ENCOUNTER — Other Ambulatory Visit: Payer: Self-pay | Admitting: *Deleted

## 2023-05-08 ENCOUNTER — Encounter: Payer: Self-pay | Admitting: Hematology and Oncology

## 2023-05-08 MED ORDER — FERROUS GLUCONATE 324 (38 FE) MG PO TABS: 324.0000 mg | ORAL_TABLET | Freq: Every day | ORAL | 1 refills | Status: DC

## 2023-05-09 NOTE — Progress Notes (Addendum)
Aleen Sells D.[facility] [address] [place] [address] Phone: [phone]   Assessment and Plan:     1. Plantar fasciitis of right foot - Chronic with exacerbation, subsequent visit - Significant improvement in pain after CSI for plantar fasciitis at previous office visit on 04/12/2023 - Continue physical activity as tolerated - Continue HEP - May use Tylenol and topical Voltaren gel for day-to-day pain relief -Continue to use cushioned tennis shoes and plan fasciitis inserts when standing for prolonged periods -Discussed that I do not recommend OTC or prescription NSAIDs due to past medical history of GI bleed, Crohn's disease  Pertinent previous records reviewed include none  Follow Up: As needed.  Could consider repeat CSI versus further discussing ECSWT versus PRP versus physical therapy   Subjective:   I, [name], am serving as a Neurosurgeon for Doctor Richardean Sale   Chief Complaint: bilat foot pain    HPI:    04/12/2023 Patient is a 42  year old female with bilat foot pain. Patient states that her pain started in January. Rolled her feet out and treated it as plantar fascitis. Has bought new shoes and those didn't help much. Pain is worse at night, and when she gets up from a seated position. Has tried RICES. Sitting helps relief the pain. No meds for the pain. No numbness and tingling. No radiating pain .    05/10/2023 Patient states feet are doing well. When she stands more thats when the pain is increase. But improved   Relevant Historical Information: History of GI bleed, Crohn's disease, elevated BM Additional pertinent review of systems negative.   Current Outpatient Medications:    Calcium Carb-Cholecalciferol (CALCIUM/VITAMIN D) 600-400 MG-UNIT TABS, Take 1 tablet by mouth at bedtime., Disp: , Rfl:    ferrous gluconate (FERGON) 324 MG tablet, Take 1 tablet (324 mg total) by mouth daily with breakfast., Disp:  90 tablet, Rfl: 1   fexofenadine (ALLEGRA) 180 MG tablet, Take 180 mg by mouth at bedtime., Disp: , Rfl:    inFLIXimab-axxq (AVSOLA) 100 MG injection, Inject 1 mg into the vein. Every month, Disp: , Rfl:    metFORMIN (GLUCOPHAGE) 500 MG tablet, 1 full tab daily with breakfast and 1 at lunch, Disp: 60 tablet, Rfl: 0   Prenatal Vit-Fe Fumarate-FA (PRENATAL MULTIVITAMIN) TABS tablet, Take 1 tablet by mouth at bedtime., Disp: , Rfl:    Vitamin D, Ergocalciferol, (DRISDOL) 1.25 MG (50000 UNIT) CAPS capsule, Take 1 capsule (50,000 Units total) by mouth every 14 (fourteen) days., Disp: 8 capsule, Rfl: 0 No current facility-administered medications for this visit.  Facility-Administered Medications Ordered in Other Visits:    acetaminophen (TYLENOL) tablet 650 mg, 650 mg, Oral, Once, Mansouraty, Netty Starring., MD   Objective:     Vitals:   05/10/23 0804  BP: 124/82  Pulse: 91  SpO2: 99%  Weight: 284 lb (128.8 kg)  Height: 5\' 2"  (1.575 m)      Body mass index is 51.94 kg/m.    Physical Exam:    Gen: Appears well, nad, nontoxic and pleasant Psych: Alert and oriented, appropriate mood and affect Neuro: sensation intact, strength is 5/5 with df/pf/inv/ev, muscle tone wnl Skin: no susupicious lesions or rashes   Right foot/ankle:  No deformity, no swelling or effusion TTP mildly calcaneal fat pad NTTP over fibular head, lat mal, medial mal, achilles, navicular, base of 5th, ATFL, CFL, deltoid, posterior calcaneous or midfoot, medial anterior calcaneus ROM DF 30, PF  45, inv/ev intact Negative ant drawer, talar tilt, rotation test, squeeze test. Neg thompson No pain with resisted inversion or eversion    Electronically signed by:  Aleen Sells D.[facility] 8:19 AM 05/10/23

## 2023-05-10 ENCOUNTER — Ambulatory Visit: Payer: BC Managed Care – PPO | Admitting: Sports Medicine

## 2023-05-10 VITALS — BP 124/82 | HR 91 | Ht 62.0 in | Wt 284.0 lb

## 2023-05-10 DIAGNOSIS — M722 Plantar fascial fibromatosis: Secondary | ICD-10-CM

## 2023-05-10 NOTE — Patient Instructions (Signed)
Tylenol as needed Topical voltaren gel as needed Continue to use cushioned tennis shoes and inserts when standing for long periods  As needed follow up

## 2023-05-10 NOTE — Addendum Note (Signed)
Addended by: Richardean Sale on: 05/10/2023 08:19 AM   Modules accepted: Level of Service

## 2023-05-14 ENCOUNTER — Ambulatory Visit: Payer: BC Managed Care – PPO

## 2023-05-14 VITALS — BP 103/68 | HR 80 | Temp 98.0°F | Resp 16 | Ht 62.0 in | Wt 291.8 lb

## 2023-05-14 DIAGNOSIS — K5000 Crohn's disease of small intestine without complications: Secondary | ICD-10-CM

## 2023-05-14 DIAGNOSIS — Z8719 Personal history of other diseases of the digestive system: Secondary | ICD-10-CM

## 2023-05-14 MED ORDER — ACETAMINOPHEN 325 MG PO TABS
650.0000 mg | ORAL_TABLET | Freq: Once | ORAL | Status: DC
Start: 2023-05-14 — End: 2023-05-14

## 2023-05-14 MED ORDER — SODIUM CHLORIDE 0.9 % IV SOLN
5.0000 mg/kg | Freq: Once | INTRAVENOUS | Status: AC
  Administered 2023-05-14: 600 mg via INTRAVENOUS
  Filled 2023-05-14: qty 60

## 2023-05-14 MED ORDER — DIPHENHYDRAMINE HCL 25 MG PO CAPS: 25.0000 mg | ORAL_CAPSULE | Freq: Once | ORAL | Status: DC

## 2023-05-14 MED ORDER — METHYLPREDNISOLONE SODIUM SUCC 40 MG IJ SOLR
40.0000 mg | Freq: Once | INTRAMUSCULAR | Status: DC
Start: 2023-05-14 — End: 2023-05-14

## 2023-05-14 NOTE — Progress Notes (Signed)
Diagnosis: Crohn's Disease  Provider:  Chilton Greathouse MD  Procedure: IV Infusion  IV Type: Peripheral, IV Location: L Antecubital  Patient refused pre-medications. Nurse educated patient and stressed the importance of taking pre-medications as a precaution in the event of a medication reaction. Patient verbalized understanding.  Avsola (infliximab-axxq), Dose: 600 mg  Infusion Start Time: 1428  Infusion Stop Time: 1708  Post Infusion IV Care: Peripheral IV Discontinued  Discharge: Condition: Good, Destination: Home . AVS Declined  Performed by:  Nat Math, RN

## 2023-05-23 ENCOUNTER — Encounter: Payer: Self-pay | Admitting: Gastroenterology

## 2023-05-23 ENCOUNTER — Ambulatory Visit: Payer: BC Managed Care – PPO | Admitting: Gastroenterology

## 2023-05-23 VITALS — BP 112/68 | HR 83 | Ht 62.0 in | Wt 292.0 lb

## 2023-05-23 DIAGNOSIS — K5080 Crohn's disease of both small and large intestine without complications: Secondary | ICD-10-CM | POA: Diagnosis not present

## 2023-05-23 DIAGNOSIS — Z79899 Other long term (current) drug therapy: Secondary | ICD-10-CM | POA: Diagnosis not present

## 2023-05-23 DIAGNOSIS — R195 Other fecal abnormalities: Secondary | ICD-10-CM | POA: Diagnosis not present

## 2023-05-23 DIAGNOSIS — D84821 Immunodeficiency due to drugs: Secondary | ICD-10-CM | POA: Diagnosis not present

## 2023-05-23 NOTE — Progress Notes (Signed)
GASTROENTEROLOGY OUTPATIENT CLINIC VISIT   Primary Care Provider Panosh, Neta Mends, MD [address] [place] [address] [phone]   Patient Profile: [name] is a 42 y.o. 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  The patient presents to the [facility] for an evaluation and management of problem(s) noted below:  Problem List 1. Crohn's disease of both small and large intestine without complication (HCC)   2. Nonspecific abnormal finding in stool contents   3. Immunosuppression due to drug therapy [facility])    Discussed the use of AI scribe software for clinical note transcription with the patient, who gave verbal consent to proceed.  History of Present Illness Please see prior notes for full details of HPI.  Interval History The patient presents for follow-up.  She reports no significant bowel-related symptom changes.  She averages two formed bowel movements per day and no blood or mucus in the stool.  She denies nocturnal symptoms.  She continues on Avsola every 4 weeks, though wonders about extending the interval between infusions to six weeks.  The patient mentioned a recent diagnosis of plantar fasciitis with consideration of topical NSAID use for symptom relief and whether systemic absorption and interaction could occur with her known IBD.  The patient denied any plans for family expansion in the future.   BMs per day -2 bowel movements Nocturnal BMs -none Blood -none Mucous -none Tenesmus -none Urgency -none Skin Manifestations -none Eye Manifestations -none Joint Manifestations -none  GI Review of Systems Positive as above Negative for pyrosis, dysphagia, odynophagia, nausea, vomiting, pain, melena, hematochezia  Review of Systems General: Denies fevers/chills/weight loss unintentionally Cardiovascular:  Denies chest pain Pulmonary: Denies shortness of breath Gastroenterological: See HPI Genitourinary: Denies darkened urine Hematological: Denies easy bruising/bleeding Dermatological: Denies jaundice Psychological: Mood is stable   Medications Current Outpatient Medications  Medication Sig Dispense Refill   Calcium Carb-Cholecalciferol (CALCIUM/VITAMIN D) 600-400 MG-UNIT TABS Take 1 tablet by mouth at bedtime.     ferrous gluconate (FERGON) 324 MG tablet Take 1 tablet (324 mg total) by mouth daily with breakfast. 90 tablet 1   fexofenadine (ALLEGRA) 180 MG tablet Take 180 mg by mouth at bedtime.     inFLIXimab-axxq (AVSOLA) 100 MG injection Inject 1 mg into the vein. Every month     metFORMIN (GLUCOPHAGE) 500 MG tablet 1 full tab daily with breakfast and 1 at lunch 60 tablet 0   Prenatal Vit-Fe Fumarate-FA (PRENATAL MULTIVITAMIN) TABS tablet Take 1 tablet by mouth at bedtime.     Vitamin D, Ergocalciferol, (DRISDOL) 1.25 MG (50000 UNIT) CAPS capsule Take 1 capsule (50,000 Units total) by mouth every 14 (fourteen) days. 8 capsule 0   No current facility-administered medications for this visit.   Facility-Administered Medications Ordered in Other Visits  Medication Dose Route Frequency Provider Last Rate Last Admin   acetaminophen (TYLENOL) tablet 650 mg  650 mg Oral Once Mansouraty, Netty Starring., MD        Allergies Allergies  Allergen Reactions   Ibuprofen Other (See Comments)    Pt states she had GI internal bleeding last year when took it .    Naproxen Other (See Comments)    Pt states she had GI internal bleeding last year when took it     Histories Past Medical History:  Diagnosis Date   Abrasion of upper arm with infection, left, sequela    started antibiotics  lef arm oct 4th started antibiotics healing well   Anemia    hx of 2 yrs ago   Blood transfusion without reported diagnosis    Chicken pox    Chronic rhinitis    Contact dermatitis and other eczema, due to  unspecified cause    Crohn's 11/15/2011   no issues in 2 yrs   Fibroids 11/15/2011   remains with fibroids   Hernia    Incisional hernia, RLQ 10/02/2011   Infertility, female    Mononucleosis    Obesity    Pilonidal abscess    Rectal abscess    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HCC)    Vitamin D deficiency    Past Surgical History:  Procedure Laterality Date   APPENDECTOMY  11/2004   BIOPSY  01/25/2022   Procedure: BIOPSY;  Surgeon: Lynann Bologna, MD;  Location: [facility];  Service: Gastroenterology;;   BIOPSY  01/26/2022   Procedure: BIOPSY;  Surgeon: Lynann Bologna, MD;  Location: [facility];  Service: Gastroenterology;;   BIOPSY  01/24/2023   Procedure: BIOPSY;  Surgeon: Lemar Lofty., MD;  Location: [facility];  Service: Gastroenterology;;   CESAREAN SECTION N/A 11/30/2020   Procedure: CESAREAN SECTION;  Surgeon: Osborn Coho, MD;  Location: [facility]S;  Service: Obstetrics;  Laterality: N/A;   COLONOSCOPY     COLONOSCOPY WITH PROPOFOL N/A 03/07/2017   Procedure: COLONOSCOPY WITH PROPOFOL;  Surgeon: Rachael Fee, MD;  Location: [facility];  Service: Endoscopy;  Laterality: N/A;   COLONOSCOPY WITH PROPOFOL N/A 01/26/2022   Procedure: COLONOSCOPY WITH PROPOFOL;  Surgeon: Lynann Bologna, MD;  Location: [facility];  Service: Gastroenterology;  Laterality: N/A;   COLONOSCOPY WITH PROPOFOL N/A 01/24/2023   Procedure: COLONOSCOPY WITH PROPOFOL;  Surgeon: Meridee Score Netty Starring., MD;  Location: [facility];  Service: Gastroenterology;  Laterality: N/A;   ESOPHAGOGASTRODUODENOSCOPY (EGD) WITH PROPOFOL N/A 01/25/2022   Procedure: ESOPHAGOGASTRODUODENOSCOPY (EGD) WITH PROPOFOL;  Surgeon: Lynann Bologna, MD;  Location: [facility];  Service: Gastroenterology;  Laterality: N/A;   EYE SURGERY  11/15/2011   2003-multiple stys   ileocecal resection  05/2009   crohns disease with fisula/stricture. Dr. Bertram Savin   INCISIONAL HERNIA REPAIR N/A 12/24/2018    Procedure: OPEN REPAIR VENTRAL INCISIONAL HERNIA WITH MESH PATCH AND PRIMARY REPAIR OF LEFT SPIGELIAN HERNIA;  Surgeon: Darnell Level, MD;  Location: [facility];  Service: General;  Laterality: N/A;   KNEE ARTHROSCOPY  99, 02   Bil.   MYOMECTOMY N/A 11/10/2014   Procedure: MYOMECTOMY;  Surgeon: Osborn Coho, MD;  Location: [facility];  Service: Gynecology;  Laterality: N/A;   PILONIDAL CYST EXCISION  2011, 2012   I&Ds   UPPER GASTROINTESTINAL ENDOSCOPY     VENTRAL HERNIA REPAIR  11/27/2011   Procedure: LAPAROSCOPIC VENTRAL HERNIA;  Surgeon: Ardeth Sportsman, MD;  Location: [facility];  Service: General;  Laterality: N/A;  Laparoscopic Exploration & Repair of Hernia in Abdomen   WISDOM TOOTH EXTRACTION     Social History   Socioeconomic History   Marital status: Married    Spouse name: Kylah Puricelli   Number of children: Not on file   Years of education: Not on file   Highest education level: Bachelor's degree (e.g., BA, AB, BS)  Occupational History   Occupation: Editor, commissioning  Tobacco Use   Smoking status: Never   Smokeless tobacco: Never  Vaping Use   Vaping status: Never Used  Substance and Sexual Activity   Alcohol use: No   Drug use: No  Sexual activity: Yes    Partners: Male  Other Topics Concern   Not on file  Social History Narrative   married   HH of 2   Occupation: Editor, 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    No pets   Sleep 6-7 hours    BF with 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   Walking exercise   Eats balanced generally   Social Drivers of Health   Financial Resource Strain: Low Risk  (09/11/2022)   Overall Financial Resource Strain (CARDIA)    Difficulty of Paying Living Expenses: Not hard at all  Food Insecurity: No Food Insecurity (09/11/2022)   Hunger Vital Sign    Worried About Running Out of Food in the Last Year: Never true    Ran Out of Food in the Last Year: Never true  Transportation Needs: No Transportation Needs (09/11/2022)   PRAPARE - Therapist, art (Medical): No    Lack of Transportation (Non-Medical): No  Physical Activity: Insufficiently Active (12/19/2022)   Exercise Vital Sign    Days of Exercise per Week: 1 day    Minutes of Exercise per Session: 20 min  Stress: No Stress Concern Present (09/11/2022)   [employer] of Occupational Health - Occupational Stress Questionnaire    Feeling of Stress : Only a little  Social Connections: Socially Integrated (12/19/2022)   Social Connection and Isolation Panel [NHANES]    Frequency of Communication with Friends and Family: More than three times a week    Frequency of Social Gatherings with Friends and Family: Patient declined    Attends Religious Services: More than 4 times per year    Active Member of [employer] or Organizations: Yes    Attends Engineer, structural: More than 4 times per year    Marital Status: Married  Catering manager Violence: Not At Risk (12/19/2022)   Humiliation, Afraid, Rape, and Kick questionnaire    Fear of Current or Ex-Partner: No    Emotionally Abused: No    Physically Abused: No    Sexually Abused: No   Family History  Problem Relation Age of Onset   Aneurysm Mother        brain   Heart disease Mother 64       heart anyrism   Healthy Father        fairly   Diabetes Sister        half sister   Bipolar disorder Sister        half sister   Multiple sclerosis Sister    Stomach cancer Paternal Grandfather    Esophageal cancer Neg Hx    Inflammatory bowel disease Neg Hx    Liver disease Neg Hx    Pancreatic cancer Neg Hx    Rectal cancer Neg Hx    Colon cancer Neg Hx    I have reviewed her medical, social, and family history in detail and updated the electronic medical record as necessary.    PHYSICAL EXAMINATION  BP 112/68   Pulse 83   Ht 5\' 2"  (1.575 m)   Wt 292 lb (132.5 kg)   LMP 05/18/2023   SpO2 98%   BMI 53.41 kg/m  Wt Readings from Last 3 Encounters:  05/23/23 292 lb (132.5 kg)  05/14/23 291 lb  12.8 oz (132.4 kg)  05/10/23 284 lb (128.8 kg)  GEN: NAD, appears stated age, doesn't appear chronically ill PSYCH: Cooperative, without pressured speech EYE: Conjunctivae pink, sclerae anicteric ENT: MMM,  without oral ulcers CV: Nontachycardic RESP: No audible wheezing GI: NABS, soft, rounded, obese, NTD, without rebound MSK/EXT: No significant lower extremity edema SKIN: No jaundice NEURO:  Alert & Oriented x 3, no focal deficits   REVIEW OF DATA  I reviewed the following data at the time of this encounter:  GI Procedures and Studies  August 2024 colonoscopy - Hemorrhoids found on digital rectal exam. - Patent functional end-to-end ileo-colonic anastomosis, characterized by mostly healthy appearing mucosa, though there were a few erosions and 1 ulcer in the base. Biopsied. - The examined portion of the neoterminal was normal. Biopsied. - Normal mucosa in the entire examined colon. Biopsied both the right and left colon. - Normal mucosa in the rectum. No evidence of fistula. Biopsied. - Non-bleeding non-thrombosed internal hemorrhoids.   Pathology FINAL MICROSCOPIC DIAGNOSIS:  A. NEOTERMINAL ILEO, BIOPSY:  - Ileal mucosa with no specific histopathologic changes  - Negative for acute inflammation, features of chronicity or granulomas  B. ANASTOMOSIS, BIOPSY:  - Patchy severely active chronic enteritis with focal erosion, see  comment  - Negative for granulomas or dysplasia  C. COLON, RIGHT, BIOPSY:  - Colonic mucosa with no specific histopathologic changes  - Negative for acute inflammation, features of chronicity, granulomas or  dysplasia  D. COLON, LEFT, BIOPSY:  - Colonic mucosa with no specific histopathologic changes  - Negative for acute inflammation, features of chronicity, granulomas or  dysplasia  E. RECTAL COLON, BIOPSY:  - Colonic mucosa with no specific histopathologic changes  - Negative for acute inflammation, features of chronicity, granulomas or  dysplasia    Laboratory Studies  Reviewed those in epic  Imaging Studies  No new studies to review   ASSESSMENT  Ms. Sampley is a 42 y.o. 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  The patient is seen today for evaluation and management of:  1. Crohn's disease of both small and large intestine without complication (HCC)   2. Nonspecific abnormal finding in stool contents   3. Immunosuppression due to drug therapy [facility])    The patient is clinically and hemodynamically stable.  She is in clinical remission.  However, her most recent colonoscopy shows that she has not obtained endoscopic remission.  It is possible she may still be able to reach this.  We briefly discussed the role of immunomodulators therapy addition for the patient in regards to continuing her current every 4 week Avsola infusions.  I am a little hesitant to go to every 6 or 8 weeks while she still has this inflammation as well as the persistently elevated fecal calprotectin.  She is going to consider 6-MP and azathioprine after doing some further research.  Will plan to repeat her lab work as well as a fecal calprotectin in March.  If fecal calprotectin is elevating and/or her laboratory evaluations suggest progressive inflammation levels, we will need to consider immunomodulators or in greater detail.  Plan tentatively for repeat colonoscopy in August 2025 to ensure mucosal healing.  All patient questions were answered to the best of my ability, and the patient agrees to the aforementioned plan of action with follow-up as indicated.    PLAN  Laboratories as outlined below to be drawn in March 2025 Continue Avsola 10 mg/kg every 4 week dosing Repeat fecal calprotectin in March 2025 Consider immunomodulation (azathioprine/6-MP) - TPMT testing pending Colonoscopy for evaluation of mucosal healing in 8/25    Orders  Placed This Encounter  Procedures   Calprotectin, Fecal   CBC   Comp Met (CMET)   Sedimentation rate   C-reactive protein   QuantiFERON-TB Gold Plus   Thiopurine methyltransferase(tpmt)rbc    New Prescriptions   No medications on file   Modified Medications   No medications on file    Planned Follow Up No follow-ups on file.   Total Time in Face-to-Face and in Coordination of Care for patient including independent/personal interpretation/review of prior testing, medical history, examination, medication adjustment, communicating results with the patient directly, and documentation within the EHR is 25 minutes.   Corliss Parish, MD [facility] Office # [phone]

## 2023-05-23 NOTE — Patient Instructions (Signed)
Your provider has requested that you go to the basement level for lab work in March 2025. Press "B" on the elevator. The lab is located at the first door on the left as you exit the elevator.  Consider ( Mercaptopurine) or Azathioprine as possible adjunction with Remicade.   _______________________________________________________  If your blood pressure at your visit was 140/90 or greater, please contact your primary care physician to follow up on this.  _______________________________________________________  If you are age 81 or older, your body mass index should be between 23-30. Your Body mass index is 53.41 kg/m. If this is out of the aforementioned range listed, please consider follow up with your Primary Care Provider.  If you are age 60 or younger, your body mass index should be between 19-25. Your Body mass index is 53.41 kg/m. If this is out of the aformentioned range listed, please consider follow up with your Primary Care Provider.   ________________________________________________________  The [facility] providers would like to encourage you to use [facility] to communicate with providers for non-urgent requests or questions.  Due to long hold times on the telephone, sending your provider a message by [facility] may be a faster and more efficient way to get a response.  Please allow 48 business hours for a response.  Please remember that this is for non-urgent requests.  _______________________________________________________  Thank you for choosing me and [facility].  Dr. Meridee Score

## 2023-05-27 ENCOUNTER — Encounter: Payer: Self-pay | Admitting: Gastroenterology

## 2023-05-27 ENCOUNTER — Encounter: Payer: Self-pay | Admitting: Hematology and Oncology

## 2023-06-11 ENCOUNTER — Ambulatory Visit: Payer: BC Managed Care – PPO

## 2023-06-12 ENCOUNTER — Ambulatory Visit: Payer: 59

## 2023-06-12 ENCOUNTER — Encounter: Payer: Self-pay | Admitting: Gastroenterology

## 2023-06-12 ENCOUNTER — Encounter: Payer: Self-pay | Admitting: Hematology and Oncology

## 2023-06-12 VITALS — BP 114/71 | HR 90 | Temp 97.5°F | Resp 20 | Ht 62.5 in | Wt 294.0 lb

## 2023-06-12 DIAGNOSIS — Z8719 Personal history of other diseases of the digestive system: Secondary | ICD-10-CM

## 2023-06-12 DIAGNOSIS — K5000 Crohn's disease of small intestine without complications: Secondary | ICD-10-CM | POA: Diagnosis not present

## 2023-06-12 MED ORDER — DIPHENHYDRAMINE HCL 25 MG PO CAPS
25.0000 mg | ORAL_CAPSULE | Freq: Once | ORAL | Status: DC
Start: 2023-06-12 — End: 2023-06-12

## 2023-06-12 MED ORDER — METHYLPREDNISOLONE SODIUM SUCC 40 MG IJ SOLR: 40.0000 mg | Freq: Once | INTRAMUSCULAR | Status: DC

## 2023-06-12 MED ORDER — ACETAMINOPHEN 325 MG PO TABS
650.0000 mg | ORAL_TABLET | Freq: Once | ORAL | Status: DC
Start: 2023-06-12 — End: 2023-06-12

## 2023-06-12 MED ORDER — SODIUM CHLORIDE 0.9 % IV SOLN
5.0000 mg/kg | Freq: Once | INTRAVENOUS | Status: AC
  Administered 2023-06-12: 700 mg via INTRAVENOUS
  Filled 2023-06-12: qty 70

## 2023-06-12 NOTE — Progress Notes (Signed)
 Diagnosis: Crohn's Disease  Provider:  Praveen Mannam MD  Procedure: IV Infusion  IV Type: Peripheral, IV Location: L Antecubital  Avsola  (infliximab -axxq), Dose: 700mg   Infusion Start Time: 1343  Infusion Stop Time: 1610  Post Infusion IV Care: Peripheral IV Discontinued  Discharge: Condition: Good, Destination: Home . AVS Declined  Performed by:  Star East, LPN

## 2023-06-13 ENCOUNTER — Encounter: Payer: Self-pay | Admitting: Hematology and Oncology

## 2023-06-13 ENCOUNTER — Ambulatory Visit (INDEPENDENT_AMBULATORY_CARE_PROVIDER_SITE_OTHER): Payer: 59 | Admitting: Adult Health

## 2023-06-13 ENCOUNTER — Encounter (INDEPENDENT_AMBULATORY_CARE_PROVIDER_SITE_OTHER): Payer: Self-pay | Admitting: Adult Health

## 2023-06-13 ENCOUNTER — Encounter: Payer: Self-pay | Admitting: Gastroenterology

## 2023-06-13 VITALS — BP 98/62 | HR 93 | Temp 98.6°F | Ht 62.0 in | Wt 288.0 lb

## 2023-06-13 DIAGNOSIS — E669 Obesity, unspecified: Secondary | ICD-10-CM

## 2023-06-13 DIAGNOSIS — R7303 Prediabetes: Secondary | ICD-10-CM | POA: Diagnosis not present

## 2023-06-13 DIAGNOSIS — K5090 Crohn's disease, unspecified, without complications: Secondary | ICD-10-CM

## 2023-06-13 DIAGNOSIS — E559 Vitamin D deficiency, unspecified: Secondary | ICD-10-CM

## 2023-06-13 DIAGNOSIS — Z6841 Body Mass Index (BMI) 40.0 and over, adult: Secondary | ICD-10-CM

## 2023-06-13 DIAGNOSIS — E66813 Obesity, class 3: Secondary | ICD-10-CM

## 2023-06-13 MED ORDER — METFORMIN HCL 500 MG PO TABS: ORAL_TABLET | ORAL | 0 refills | Status: DC

## 2023-06-13 MED ORDER — VITAMIN D (ERGOCALCIFEROL) 1.25 MG (50000 UNIT) PO CAPS: 50000.0000 [IU] | ORAL_CAPSULE | ORAL | 0 refills | Status: DC

## 2023-06-13 NOTE — Progress Notes (Signed)
WEIGHT SUMMARY AND BIOMETRICS  Vitals Temp: 98.6 F (37 C) BP: 98/62 Pulse Rate: 93 SpO2: 96 %   Anthropometric Measurements Height: 5\' 2"  (1.575 m) Weight: 288 lb (130.6 kg) BMI (Calculated): 52.66 Weight at Last Visit: 284lb Weight Lost Since Last Visit: 0 Weight Gained Since Last Visit: 4lb Starting Weight: 293lb Total Weight Loss (lbs): 5 lb (2.268 kg)   Body Composition  Body Fat %: 52.2 % Fat Mass (lbs): 150.6 lbs Muscle Mass (lbs): 131 lbs Total Body Water (lbs): 105.4 lbs Visceral Fat Rating : 19   Other Clinical Data Fasting: yes Labs: no Today's Visit #: 72 Starting Date: 10/02/17    Chief Complaint:   OBESITY [name] is here to discuss her progress with her obesity treatment plan. She is on the keeping a food journal and adhering to recommended goals of 1300-1500 calories and 100+ protein and states she is following her eating plan approximately 60 % of the time. She states she is exercising: None   Interim History:  2025 Health Goals: 1) Increase regular f/u with [facility], every 2 weeks 2) Continue to drink at least 80 oz water/day 3) Use walking pad at least once in two weeks 4) Every 2 weeks, goal weight loss is 0-2 lbs  She provided the following food recall that is typical of a day: Breakfast: Casserole of eggs, egg whites, sausage, and cheese Lunch: Chicken or [place] meatballs-she utilizes crockpot to warm food at work [employer]: Will vary- she has been trying to incorporate vegetables each evening (brussels sprouts or spinach)  She denies family hx of MENS 2 or MTC She denies personal hx of pancreatitis. She and her husband use condoms for birth control. Discussed risks/benefits of GLP-1 and GLP-1/GIP therapy. Unsure if this therapy is covered by her current insurance provider  Subjective:   1. Crohn's disease of intestine without complication (HCC) 05/23/2023 GI OV Notes: Patient Profile: [name] is a 43 y.o. female with a pmh  significant for Crohn's ileocolitis disease (status post ileocecectomy - Q4W Avsola), status post appendectomy, obesity, allergies, iron deficiency.  The patient presents to the [name] for an evaluation and management of problem(s) noted below: Interval History The patient presents for follow-up.  She reports no significant bowel-related symptom changes.  She averages two formed bowel movements per day and no blood or mucus in the stool.  She denies nocturnal symptoms.  She continues on Avsola every 4 weeks, though wonders about extending the interval between infusions to six weeks.  The patient mentioned a recent diagnosis of plantar fasciitis with consideration of topical NSAID use for symptom relief and whether systemic absorption and interaction could occur with her known IBD.  The patient denied any plans for family expansion in the future.  BMs per day -2 bowel movements Nocturnal BMs -none Blood -none Mucous -none Tenesmus -none Urgency -none Skin Manifestations -none Eye Manifestations -none Joint Manifestations -none   GI Procedures and Studies  August 2024 colonoscopy - Hemorrhoids found on digital rectal exam. - Patent functional end-to-end ileo-colonic anastomosis, characterized by mostly healthy appearing mucosa, though there were a few erosions and 1 ulcer in the base. Biopsied. - The examined portion of the neoterminal was normal. Biopsied. - Normal mucosa in the entire examined colon. Biopsied both the right and left colon. - Normal mucosa in the rectum. No evidence of fistula. Biopsied. - Non-bleeding non-thrombosed internal hemorrhoids.    Pathology FINAL MICROSCOPIC DIAGNOSIS:  A. NEOTERMINAL ILEO, BIOPSY:  -  Ileal mucosa with no specific histopathologic changes  - Negative for acute inflammation, features of chronicity or granulomas  B. ANASTOMOSIS, BIOPSY:  - Patchy severely active chronic enteritis with focal erosion, see  comment  - Negative  for granulomas or dysplasia  C. COLON, RIGHT, BIOPSY:  - Colonic mucosa with no specific histopathologic changes  - Negative for acute inflammation, features of chronicity, granulomas or  dysplasia  D. COLON, LEFT, BIOPSY:  - Colonic mucosa with no specific histopathologic changes  - Negative for acute inflammation, features of chronicity, granulomas or  dysplasia  E. RECTAL COLON, BIOPSY:  - Colonic mucosa with no specific histopathologic changes  - Negative for acute inflammation, features of chronicity, granulomas or  dysplasia    Laboratory Studies  Reviewed those in epic   Imaging Studies  No new studies to review     ASSESSMENT  [name] is a 43 y.o. 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  The patient is seen today for evaluation and management of:   1. Crohn's disease of both small and large intestine without complication (HCC)   2. Nonspecific abnormal finding in stool contents   3. Immunosuppression due to drug therapy [name])     The patient is clinically and hemodynamically stable.  She is in clinical remission.  However, her most recent colonoscopy shows that she has not obtained endoscopic remission.  It is possible she may still be able to reach this.  We briefly discussed the role of immunomodulators therapy addition for the patient in regards to continuing her current every 4 week Avsola infusions.  I am a little hesitant to go to every 6 or 8 weeks while she still has this inflammation as well as the persistently elevated fecal calprotectin.  She is going to consider 6-MP and azathioprine after doing some further research.  Will plan to repeat her lab work as well as a fecal calprotectin in March.  If fecal calprotectin is elevating and/or her laboratory evaluations suggest progressive inflammation levels, we will need to consider immunomodulators  or in greater detail.  Plan tentatively for repeat colonoscopy in August 2025 to ensure mucosal healing.  All patient questions were answered to the best of my ability, and the patient agrees to the aforementioned plan of action with follow-up as indicated.     PLAN  Laboratories as outlined below to be drawn in March 2025 Continue Avsola 10 mg/kg every 4 week dosing Repeat fecal calprotectin in March 2025 Consider immunomodulation (azathioprine/6-MP) - TPMT testing pending Colonoscopy for evaluation of mucosal healing in 8/25   2. Vitamin D deficiency  Latest Reference Range & Units 03/20/23 08:36  Vitamin D, 25-Hydroxy 30.0 - 100.0 ng/mL 66.5   She is on Ergocalciferol Q14D She endorses stable energy levels  3. Pre-diabetes She is currently on Metformin 500mg  1 tab at breakfast and 1 tab at lunch She denies GI upset  She reports stable appetite  Assessment/Plan:   1. Crohn's disease of intestine without complication (HCC) Continue Avsola 10 mg/kg every 4 week dosing F/u with GI as directed  2. Vitamin D deficiency (Primary) Refill Vitamin D, Ergocalciferol, (DRISDOL) 1.25 MG (50000 UNIT) CAPS capsule Take 1 capsule (50,000 Units total) by mouth every 14 (fourteen) days. Dispense: 8 capsule, Refills: 0 ordered   3. Pre-diabetes Refill - metFORMIN (GLUCOPHAGE) 500 MG tablet; 1 full tab daily with breakfast and 1 at lunch  Dispense:  60 tablet; Refill: 0  4. Obesity, current BMI 52.66  Almeter is currently in the action stage of change. As such, her goal is to continue with weight loss efforts. She has agreed to keeping a food journal and adhering to recommended goals of 1300-1500 calories and 100+ protein.   Exercise goals: All adults should avoid inactivity. Some physical activity is better than none, and adults who participate in any amount of physical activity gain some health benefits. Adults should also include muscle-strengthening activities that involve all major muscle  groups on 2 or more days a week.  Behavioral modification strategies: increasing lean protein intake, decreasing simple carbohydrates, increasing vegetables, increasing water intake, no skipping meals, meal planning and cooking strategies, keeping healthy foods in the home, ways to avoid boredom eating, better snacking choices, and planning for success.  Lasondra has agreed to follow-up with our clinic in 2 weeks. She was informed of the importance of frequent follow-up visits to maximize her success with intensive lifestyle modifications for her multiple health conditions.   Objective:   Blood pressure 98/62, pulse 93, temperature 98.6 F (37 C), height 5\' 2"  (1.575 m), weight 288 lb (130.6 kg), last menstrual period 06/13/2023, SpO2 96%. Body mass index is 52.68 kg/m.  General: Cooperative, alert, well developed, in no acute distress. HEENT: Conjunctivae and lids unremarkable. Cardiovascular: Regular rhythm.  Lungs: Normal work of breathing. Neurologic: No focal deficits.   Lab Results  Component Value Date   CREATININE 0.70 04/29/2023   BUN 19 04/29/2023   NA 137 04/29/2023   K 3.9 04/29/2023   CL 103 04/29/2023   CO2 29 04/29/2023   Lab Results  Component Value Date   ALT 14 04/29/2023   AST 15 04/29/2023   ALKPHOS 82 04/29/2023   BILITOT 0.3 04/29/2023   Lab Results  Component Value Date   HGBA1C 5.9 (H) 03/20/2023   HGBA1C 5.7 (H) 10/17/2022   HGBA1C 5.6 06/13/2022   HGBA1C 5.8 (H) 12/26/2021   HGBA1C 5.2 03/15/2021   Lab Results  Component Value Date   INSULIN 7.1 03/20/2023   INSULIN 7.2 10/17/2022   INSULIN 11.5 06/13/2022   INSULIN 5.1 12/26/2021   INSULIN 4.4 03/15/2021   Lab Results  Component Value Date   TSH 1.050 10/17/2022   Lab Results  Component Value Date   CHOL 151 12/19/2022   HDL 45.20 12/19/2022   LDLCALC 96 12/19/2022   TRIG 47.0 12/19/2022   CHOLHDL 3 12/19/2022   Lab Results  Component Value Date   VD25OH 66.5 03/20/2023    VD25OH 49.4 10/17/2022   VD25OH 39.5 06/13/2022   Lab Results  Component Value Date   WBC 11.8 (H) 04/29/2023   HGB 13.8 04/29/2023   HCT 42.8 04/29/2023   MCV 93.9 04/29/2023   PLT 327 04/29/2023   Lab Results  Component Value Date   IRON 55 04/29/2023   TIBC 291 04/29/2023   FERRITIN 13 01/14/2023    Attestation Statements:   Reviewed by clinician on day of visit: allergies, medications, problem list, medical history, surgical history, family history, social history, and previous encounter notes.  I have reviewed the above documentation for accuracy and completeness, and I agree with the above. -  Makye Radle d. Sirus Labrie, NP-C

## 2023-06-17 ENCOUNTER — Encounter: Payer: Self-pay | Admitting: Hematology and Oncology

## 2023-06-17 ENCOUNTER — Encounter: Payer: Self-pay | Admitting: Gastroenterology

## 2023-06-25 ENCOUNTER — Other Ambulatory Visit: Payer: Self-pay | Admitting: Obstetrics and Gynecology

## 2023-06-25 DIAGNOSIS — Z1231 Encounter for screening mammogram for malignant neoplasm of breast: Secondary | ICD-10-CM

## 2023-07-05 ENCOUNTER — Other Ambulatory Visit: Payer: Self-pay | Admitting: *Deleted

## 2023-07-05 ENCOUNTER — Telehealth: Payer: Self-pay

## 2023-07-05 DIAGNOSIS — D509 Iron deficiency anemia, unspecified: Secondary | ICD-10-CM

## 2023-07-05 NOTE — Telephone Encounter (Signed)
 Left message for patient confirming lab and dr visit on 07/08/23 with Dr Arno Bibles.  Advised to call back to [phone] if needed to reschedule.

## 2023-07-08 ENCOUNTER — Encounter: Payer: Self-pay | Admitting: Hematology and Oncology

## 2023-07-08 ENCOUNTER — Encounter: Payer: Self-pay | Admitting: Gastroenterology

## 2023-07-08 ENCOUNTER — Inpatient Hospital Stay: Payer: Self-pay

## 2023-07-08 ENCOUNTER — Inpatient Hospital Stay: Payer: 59 | Attending: Hematology and Oncology | Admitting: Hematology and Oncology

## 2023-07-08 VITALS — BP 128/75 | HR 77 | Temp 97.3°F | Resp 16 | Wt 289.7 lb

## 2023-07-08 DIAGNOSIS — D509 Iron deficiency anemia, unspecified: Secondary | ICD-10-CM

## 2023-07-08 DIAGNOSIS — K5090 Crohn's disease, unspecified, without complications: Secondary | ICD-10-CM | POA: Diagnosis not present

## 2023-07-08 LAB — CBC WITH DIFFERENTIAL (CANCER CENTER ONLY)
Abs Immature Granulocytes: 0.04 10*3/uL (ref 0.00–0.07)
Basophils Absolute: 0 10*3/uL (ref 0.0–0.1)
Basophils Relative: 0 %
Eosinophils Absolute: 0.1 10*3/uL (ref 0.0–0.5)
Eosinophils Relative: 1 %
HCT: 44.5 % (ref 36.0–46.0)
Hemoglobin: 14.1 g/dL (ref 12.0–15.0)
Immature Granulocytes: 0 %
Lymphocytes Relative: 25 %
Lymphs Abs: 2.6 10*3/uL (ref 0.7–4.0)
MCH: 30 pg (ref 26.0–34.0)
MCHC: 31.7 g/dL (ref 30.0–36.0)
MCV: 94.7 fL (ref 80.0–100.0)
Monocytes Absolute: 1 10*3/uL (ref 0.1–1.0)
Monocytes Relative: 9 %
Neutro Abs: 6.6 10*3/uL (ref 1.7–7.7)
Neutrophils Relative %: 65 %
Platelet Count: 341 10*3/uL (ref 150–400)
RBC: 4.7 MIL/uL (ref 3.87–5.11)
RDW: 13.9 % (ref 11.5–15.5)
WBC Count: 10.4 10*3/uL (ref 4.0–10.5)
nRBC: 0 % (ref 0.0–0.2)

## 2023-07-08 LAB — CMP (CANCER CENTER ONLY)
ALT: 11 U/L (ref 0–44)
AST: 15 U/L (ref 15–41)
Albumin: 3.7 g/dL (ref 3.5–5.0)
Alkaline Phosphatase: 66 U/L (ref 38–126)
Anion gap: 5 (ref 5–15)
BUN: 18 mg/dL (ref 6–20)
CO2: 29 mmol/L (ref 22–32)
Calcium: 9 mg/dL (ref 8.9–10.3)
Chloride: 104 mmol/L (ref 98–111)
Creatinine: 0.76 mg/dL (ref 0.44–1.00)
GFR, Estimated: >60 mL/min (ref >60)
Glucose, Bld: 91 mg/dL (ref 70–99)
Potassium: 4 mmol/L (ref 3.5–5.1)
Sodium: 138 mmol/L (ref 135–145)
Total Bilirubin: 0.3 mg/dL (ref 0.0–1.2)
Total Protein: 7.6 g/dL (ref 6.5–8.1)

## 2023-07-08 LAB — IRON AND IRON BINDING CAPACITY (CC-WL,HP ONLY)
Iron: 39 ug/dL (ref 28–170)
Saturation Ratios: 13 % (ref 10.4–31.8)
TIBC: 295 ug/dL (ref 250–450)
UIBC: 256 ug/dL (ref 148–442)

## 2023-07-08 LAB — FERRITIN: Ferritin: 17 ng/mL (ref 11–307)

## 2023-07-08 NOTE — Progress Notes (Signed)
 [facility] CONSULT NOTE  Patient Care Team: Panosh, Joaquim Muir, MD as PCP - General (Internal Medicine) Janel Medford, MD (Inactive) (Gastroenterology) Renea Carrion, MD (Obstetrics and Gynecology) Alanson Alliance, MD (Rheumatology) Lorayne Rocks, DO as Consulting Physician (Bariatrics) Lajuan Pila, MD as Consulting Physician (Gastroenterology) Murleen Arms, [name] Murleen Arms, MD as Consulting Physician (Hematology and Oncology)  CHIEF COMPLAINTS/PURPOSE OF CONSULTATION:  Anemia  ASSESSMENT & PLAN:   This is a very pleasant 43 year old female patient with Crohn's disease, severe anemia with multiple ER visits and hospitalizations recently, currently on infliximab  for the past several years with great response referred to hematology here for follow up regarding severe anemia.   Iron  Deficiency Anemia No current symptoms of anemia. Taking iron  65mg  twice daily with darker stools as a side effect. Awaiting lab results to assess response to increased iron  supplementation. -Continue iron  65mg  twice daily. -Labs today indicate no evidence of ongoing anemia. -CBC, Iron  panel normal. Ferritin pending.  Crohn's Disease No recent exacerbations or bloody stools. Currently on Infliximab  and due for colonoscopy and fecal calprotectin to assess disease activity. -Continue Infliximab  as prescribed. -Proceed with planned colonoscopy and fecal calprotectin.  Follow-up in 4-6 months or sooner if symptoms of anemia return.   HISTORY OF PRESENTING ILLNESS:  [name] 42 y.o. female is here because of anemia and thrombocytosis.  This is a 43 year old with Crohn's disease status post colonic resection and currently on infliximab  who was recently seen in the emergency room with syncopal episode was found to have Hemoccult positive and hemoglobin of 6.9, significant drop from hemoglobin of 12.1.  She was admitted to the  hospital with GI bleeding and underwent upper GI endoscopy that was essentially negative and showed a small antral erosion.  She also underwent colonoscopy which showed multiple anastomotic ulcers with signs of Crohn's recurrence.  She was then given packed red blood cells and was discharged to also follow-up with hematology.  Discussed the use of AI scribe software for clinical note transcription with the patient, who gave verbal consent to proceed.  History of Present Illness    [name] is a 43 year old female with Crohn's disease and iron  deficiency who presents for follow-up on her iron  supplementation and Crohn's management.  She is currently taking iron  supplements at a dose of 65 mg twice daily. No significant side effects are noted from the increased dosage, except for occasionally darker stools. No fatigue, cravings for ice chips, or other symptoms typically associated with iron  deficiency are present. She is able to perform daily chores without difficulty and reports no issues with breathing or lightheadedness.  Her Crohn's disease is not in full remission, but she is undergoing further evaluation with a fecal test and colonoscopy. She is currently on infliximab  for management and reports no recent exacerbations or bloody stools.  She menstruates regularly without heavy bleeding.   Rest of the pertinent 10 point ROS reviewed and negative  REVIEW OF SYSTEMS:   Constitutional: Denies fevers, chills or abnormal night sweats Eyes: Denies blurriness of vision, double vision or watery eyes Ears, nose, mouth, throat, and face: Denies mucositis or sore throat Respiratory: Denies cough, dyspnea or wheezes Cardiovascular: Denies palpitation, chest discomfort or lower extremity swelling Gastrointestinal:  Denies nausea, heartburn or change in bowel habits Skin: Denies abnormal skin rashes Lymphatics: Denies new lymphadenopathy or easy bruising Neurological:Denies numbness, tingling or  new weaknesses Behavioral/Psych: Mood is stable, no new  changes  All other systems were reviewed with the patient and are negative.  MEDICAL HISTORY:  Past Medical History:  Diagnosis Date   Abrasion of upper arm with infection, left, sequela    started antibiotics lef arm oct 4th started antibiotics healing well   Anemia    hx of 2 yrs ago   Blood transfusion without reported diagnosis    Chicken pox    Chronic rhinitis    Contact dermatitis and other eczema, due to unspecified cause    Crohn's 11/15/2011   no issues in 2 yrs   Fibroids 11/15/2011   remains with fibroids   Hernia    Incisional hernia, RLQ 10/02/2011   Infertility, female    Mononucleosis    Obesity    Pilonidal abscess    Rectal abscess    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HCC)    Vitamin D  deficiency     SURGICAL HISTORY: Past Surgical History:  Procedure Laterality Date   APPENDECTOMY  11/2004   BIOPSY  01/25/2022   Procedure: BIOPSY;  Surgeon: Lajuan Pila, MD;  Location: [facility];  Service: Gastroenterology;;   BIOPSY  01/26/2022   Procedure: BIOPSY;  Surgeon: Lajuan Pila, MD;  Location: [facility];  Service: Gastroenterology;;   BIOPSY  01/24/2023   Procedure: BIOPSY;  Surgeon: Normie Becton., MD;  Location: [facility];  Service: Gastroenterology;;   CESAREAN SECTION N/A 11/30/2020   Procedure: CESAREAN SECTION;  Surgeon: Renea Carrion, MD;  Location: [facility];  Service: Obstetrics;  Laterality: N/A;   COLONOSCOPY     COLONOSCOPY WITH PROPOFOL  N/A 03/07/2017   Procedure: COLONOSCOPY WITH PROPOFOL ;  Surgeon: Janel Medford, MD;  Location: [facility];  Service: Endoscopy;  Laterality: N/A;   COLONOSCOPY WITH PROPOFOL  N/A 01/26/2022   Procedure: COLONOSCOPY WITH PROPOFOL ;  Surgeon: Lajuan Pila, MD;  Location: [facility];  Service: Gastroenterology;  Laterality: N/A;   COLONOSCOPY WITH PROPOFOL  N/A 01/24/2023   Procedure: COLONOSCOPY WITH PROPOFOL ;  Surgeon:  Mansouraty, Albino Alu., MD;  Location: [facility];  Service: Gastroenterology;  Laterality: N/A;   ESOPHAGOGASTRODUODENOSCOPY (EGD) WITH PROPOFOL  N/A 01/25/2022   Procedure: ESOPHAGOGASTRODUODENOSCOPY (EGD) WITH PROPOFOL ;  Surgeon: Lajuan Pila, MD;  Location: [facility];  Service: Gastroenterology;  Laterality: N/A;   EYE SURGERY  11/15/2011   2003-multiple stys   ileocecal resection  05/2009   crohns disease with fisula/stricture. Dr. Shea Denier   INCISIONAL HERNIA REPAIR N/A 12/24/2018   Procedure: OPEN REPAIR VENTRAL INCISIONAL HERNIA WITH MESH PATCH AND PRIMARY REPAIR OF LEFT SPIGELIAN HERNIA;  Surgeon: Oralee Billow, MD;  Location: [facility];  Service: General;  Laterality: N/A;   KNEE ARTHROSCOPY  99, 02   Bil.   MYOMECTOMY N/A 11/10/2014   Procedure: MYOMECTOMY;  Surgeon: Renea Carrion, MD;  Location: [facility];  Service: Gynecology;  Laterality: N/A;   PILONIDAL CYST EXCISION  2011, 2012   I&Ds   UPPER GASTROINTESTINAL ENDOSCOPY     VENTRAL HERNIA REPAIR  11/27/2011   Procedure: LAPAROSCOPIC VENTRAL HERNIA;  Surgeon: Eddye Goodie, MD;  Location: [facility];  Service: General;  Laterality: N/A;  Laparoscopic Exploration & Repair of Hernia in Abdomen   WISDOM TOOTH EXTRACTION      SOCIAL HISTORY: Social History   Socioeconomic History   Marital status: Married    Spouse name: Delois Gravatt   Number of children: Not on file   Years of education: Not on file   Highest education level: Bachelor's degree (e.g., BA, AB, BS)  Occupational History  Occupation: Editor, commissioning  Tobacco Use   Smoking status: Never   Smokeless tobacco: Never  Vaping Use   Vaping status: Never Used  Substance and Sexual Activity   Alcohol use: No   Drug use: No   Sexual activity: Yes    Partners: Male  Other Topics Concern   Not on file  Social History Narrative   married   HH of 2   Occupation: Editor, 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    No pets   Sleep 6-7 hours    BF with  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   Walking exercise   Eats balanced generally   Social Drivers of Health   Financial Resource Strain: Low Risk  (09/11/2022)   Overall Financial Resource Strain (CARDIA)    Difficulty of Paying Living Expenses: Not hard at all  Food Insecurity: No Food Insecurity (09/11/2022)   Hunger Vital Sign    Worried About Running Out of Food in the Last Year: Never true    Ran Out of Food in the Last Year: Never true  Transportation Needs: No Transportation Needs (09/11/2022)   PRAPARE - Administrator, Civil Service (Medical): No    Lack of Transportation (Non-Medical): No  Physical Activity: Insufficiently Active (12/19/2022)   Exercise Vital Sign    Days of Exercise per Week: 1 day    Minutes of Exercise per Session: 20 min  Stress: No Stress Concern Present (09/11/2022)   [employer] of Occupational Health - Occupational Stress Questionnaire    Feeling of Stress : Only a little  Social Connections: Socially Integrated (12/19/2022)   Social Connection and Isolation Panel [NHANES]    Frequency of Communication with Friends and Family: More than three times a week    Frequency of Social Gatherings with Friends and Family: Patient declined    Attends Religious Services: More than 4 times per year    Active Member of [employer] or Organizations: Yes    Attends Engineer, structural: More than 4 times per year    Marital Status: Married  Catering manager Violence: Not At Risk (12/19/2022)   Humiliation, Afraid, Rape, and Kick questionnaire    Fear of Current or Ex-Partner: No    Emotionally Abused: No    Physically Abused: No    Sexually Abused: No    FAMILY HISTORY: Family History  Problem Relation Age of Onset   Aneurysm Mother        brain   Heart disease Mother 44       heart anyrism   Healthy Father        fairly   Diabetes Sister        half sister   Bipolar disorder Sister        half sister   Multiple sclerosis Sister    Stomach cancer  Paternal Grandfather    Esophageal cancer Neg Hx    Inflammatory bowel disease Neg Hx    Liver disease Neg Hx    Pancreatic cancer Neg Hx    Rectal cancer Neg Hx    Colon cancer Neg Hx     ALLERGIES:  is allergic to ibuprofen  and naproxen .  MEDICATIONS:  Current Outpatient Medications  Medication Sig Dispense Refill   Calcium  Carb-Cholecalciferol  (CALCIUM /VITAMIN D ) 600-400 MG-UNIT TABS Take 1 tablet by mouth at bedtime.     ferrous gluconate  (FERGON) 324 MG tablet Take 1 tablet (324 mg total) by mouth daily with breakfast. 90 tablet 1  fexofenadine (ALLEGRA) 180 MG tablet Take 180 mg by mouth at bedtime.     inFLIXimab -axxq (AVSOLA ) 100 MG injection Inject 1 mg into the vein. Every month     metFORMIN  (GLUCOPHAGE ) 500 MG tablet 1 full tab daily with breakfast and 1 at lunch 60 tablet 0   Prenatal Vit-Fe Fumarate-FA (PRENATAL MULTIVITAMIN) TABS tablet Take 1 tablet by mouth at bedtime.     Vitamin D , Ergocalciferol , (DRISDOL ) 1.25 MG (50000 UNIT) CAPS capsule Take 1 capsule (50,000 Units total) by mouth every 14 (fourteen) days. 8 capsule 0   No current facility-administered medications for this visit.   Facility-Administered Medications Ordered in Other Visits  Medication Dose Route Frequency Provider Last Rate Last Admin   acetaminophen  (TYLENOL ) tablet 650 mg  650 mg Oral Once Mansouraty, Albino Alu., MD         PHYSICAL EXAMINATION: ECOG PERFORMANCE STATUS: 0 - Asymptomatic  Vitals:   07/08/23 0843  BP: 128/75  Pulse: 77  Resp: 16  Temp: (!) 97.3 F (36.3 C)  SpO2: 100%    Filed Weights   07/08/23 0843  Weight: 289 lb 11.2 oz (131.4 kg)     Physical Exam Constitutional:      Appearance: Normal appearance.  Cardiovascular:     Rate and Rhythm: Normal rate.     Pulses: Normal pulses.  Pulmonary:     Effort: Pulmonary effort is normal.  Musculoskeletal:        General: Normal range of motion.     Cervical back: Normal range of motion. No rigidity.   Lymphadenopathy:     Cervical: No cervical adenopathy.  Skin:    General: Skin is warm and dry.  Neurological:     General: No focal deficit present.     Mental Status: She is alert.       LABORATORY DATA:  I have reviewed the data as listed Lab Results  Component Value Date   WBC 11.8 (H) 04/29/2023   HGB 13.8 04/29/2023   HCT 42.8 04/29/2023   MCV 93.9 04/29/2023   PLT 327 04/29/2023     Chemistry      Component Value Date/Time   NA 137 04/29/2023 0748   NA 138 03/20/2023 0836   K 3.9 04/29/2023 0748   CL 103 04/29/2023 0748   CO2 29 04/29/2023 0748   BUN 19 04/29/2023 0748   BUN 12 03/20/2023 0836   CREATININE 0.70 04/29/2023 0748      Component Value Date/Time   CALCIUM  8.7 (L) 04/29/2023 0748   CALCIUM  7.6 (L) 01/25/2022 0328   ALKPHOS 82 04/29/2023 0748   AST 15 04/29/2023 0748   ALT 14 04/29/2023 0748   BILITOT 0.3 04/29/2023 0748       RADIOGRAPHIC STUDIES: I have personally reviewed the radiological images as listed and agreed with the findings in the report. No results found.  All questions were answered. The patient knows to call the clinic with any problems, questions or concerns. I spent 20 minutes in the care of this patient including H and P, review of records, counseling and coordination of care.     Murleen Arms, MD 07/08/2023 8:45 AM

## 2023-07-10 ENCOUNTER — Ambulatory Visit: Payer: 59

## 2023-07-10 VITALS — BP 108/73 | HR 80 | Temp 97.3°F | Resp 20 | Ht 62.5 in | Wt 291.8 lb

## 2023-07-10 DIAGNOSIS — Z8719 Personal history of other diseases of the digestive system: Secondary | ICD-10-CM | POA: Diagnosis not present

## 2023-07-10 DIAGNOSIS — K5000 Crohn's disease of small intestine without complications: Secondary | ICD-10-CM | POA: Diagnosis not present

## 2023-07-10 MED ORDER — METHYLPREDNISOLONE SODIUM SUCC 40 MG IJ SOLR: 40.0000 mg | Freq: Once | INTRAMUSCULAR | Status: DC

## 2023-07-10 MED ORDER — DIPHENHYDRAMINE HCL 25 MG PO CAPS: 25.0000 mg | ORAL_CAPSULE | Freq: Once | ORAL | Status: DC

## 2023-07-10 MED ORDER — ACETAMINOPHEN 325 MG PO TABS: 650.0000 mg | ORAL_TABLET | Freq: Once | ORAL | Status: DC

## 2023-07-10 MED ORDER — SODIUM CHLORIDE 0.9 % IV SOLN
5.0000 mg/kg | Freq: Once | INTRAVENOUS | Status: AC
  Administered 2023-07-10: 700 mg via INTRAVENOUS
  Filled 2023-07-10: qty 70

## 2023-07-10 NOTE — Progress Notes (Signed)
Diagnosis: Crohn's Disease  Provider:  Chilton Greathouse MD  Procedure: IV Infusion  IV Type: Peripheral, IV Location: L Antecubital  Patient stated that they already took their premedications at home at least 30 minutes prior to their infusion appointment. Patient refused Solumedrol pre-medication.  Avsola (infliximab-axxq), Dose: 700 mg  Infusion Start Time: 1345  Infusion Stop Time: 1602  Post Infusion IV Care: Peripheral IV Discontinued  Discharge: Condition: Good, Destination: Home . AVS Declined  Performed by:  Wyvonne Lenz, RN

## 2023-07-11 ENCOUNTER — Encounter (INDEPENDENT_AMBULATORY_CARE_PROVIDER_SITE_OTHER): Payer: Self-pay | Admitting: Family Medicine

## 2023-07-11 ENCOUNTER — Ambulatory Visit (INDEPENDENT_AMBULATORY_CARE_PROVIDER_SITE_OTHER): Payer: 59 | Admitting: Family Medicine

## 2023-07-11 VITALS — BP 102/68 | HR 84 | Temp 98.4°F | Ht 62.0 in | Wt 284.0 lb

## 2023-07-11 DIAGNOSIS — Z6841 Body Mass Index (BMI) 40.0 and over, adult: Secondary | ICD-10-CM | POA: Diagnosis not present

## 2023-07-11 DIAGNOSIS — E669 Obesity, unspecified: Secondary | ICD-10-CM

## 2023-07-11 DIAGNOSIS — K5090 Crohn's disease, unspecified, without complications: Secondary | ICD-10-CM | POA: Diagnosis not present

## 2023-07-11 NOTE — Progress Notes (Signed)
   SUBJECTIVE:  Chief Complaint: Obesity  Interim History: Patient has been journaling the last 3 weeks.  Breakfast and dinner are both controlled and at goal, lunch is hit or miss.  When she looks at her [employer]itness [employer] she was high on fat and high on carbs [employer] indulgence).  She has gotten a walking pad that she needs to plug in. She has been getting around 97g of protein on average in daily.  No upcoming plans for the next few weeks.    [name] is here to discuss her progress with her obesity treatment plan. She is on the keeping a food journal and adhering to recommended goals of 1300-1500 calories and 100+ grams of protein and states she is following her eating plan approximately 75 % of the time. She states she is not exercising.   OBJECTIVE: Visit Diagnoses: Problem List Items Addressed This Visit       Digestive   Crohn's disease of intestine [facility]) - Primary   Patient mentions that her disease is well managed and she is doesn't have much in terms of symptoms.  She mentions she will inform us if anything changes in her treatment plan         07/11/2023    8:00 AM 07/10/2023    4:07 PM 07/10/2023    2:53 PM  Vitals with BMI  Height 5\' 2"     Weight 284 lbs    BMI 51.93    Systolic 102 108 191  Diastolic 68 73 73  Pulse 84 80 74    No data recorded  No data recorded  No data recorded  No data recorded    ASSESSMENT AND PLAN:  Diet: [name] is currently in the action stage of change. As such, her goal is to continue with weight loss efforts. She has agreed to keeping a food journal and adhering to recommended goals of 1300-1500 calories and 95 or more grams of protein daily.  Exercise: [name] has been instructed to work up to a goal of 150 minutes of combined cardio and strengthening exercise per week for weight loss and overall health benefits.   Behavior Modification:  We discussed the following Behavioral Modification Strategies today: increasing lean  protein intake, increasing vegetables, meal planning and cooking strategies, planning for success, and keep a strict food journal.   No follow-ups on file.[name] She was informed of the importance of frequent follow up visits to maximize her success with intensive lifestyle modifications for her multiple health conditions.  Attestation Statements:   Reviewed by clinician on day of visit: allergies, medications, problem list, medical history, surgical history, family history, social history, and previous encounter notes.    Reuben Likes, MD

## 2023-07-15 ENCOUNTER — Encounter: Payer: Self-pay | Admitting: Hematology and Oncology

## 2023-07-15 ENCOUNTER — Ambulatory Visit
Admission: RE | Admit: 2023-07-15 | Discharge: 2023-07-15 | Disposition: A | Discharge status: Home / Self Care | Payer: 59 | Source: Ambulatory Visit

## 2023-07-15 DIAGNOSIS — Z1231 Encounter for screening mammogram for malignant neoplasm of breast: Secondary | ICD-10-CM

## 2023-07-16 ENCOUNTER — Other Ambulatory Visit: Payer: Self-pay | Admitting: *Deleted

## 2023-07-16 MED ORDER — FERROUS GLUCONATE 324 (38 FE) MG PO TABS: 648.0000 mg | ORAL_TABLET | Freq: Every day | ORAL | 1 refills | Status: DC

## 2023-07-16 MED ORDER — PRENATAL MULTIVITAMIN CH: 2.0000 | ORAL_TABLET | Freq: Every day | ORAL | 3 refills | Status: DC

## 2023-07-17 ENCOUNTER — Other Ambulatory Visit (INDEPENDENT_AMBULATORY_CARE_PROVIDER_SITE_OTHER): Payer: Self-pay | Admitting: Adult Health

## 2023-07-21 NOTE — Assessment & Plan Note (Signed)
 Patient mentions that her disease is well managed and she is doesn't have much in terms of symptoms.  She mentions she will inform us if anything changes in her treatment plan

## 2023-07-29 ENCOUNTER — Telehealth: Payer: Self-pay | Admitting: Pharmacy Technician

## 2023-07-29 ENCOUNTER — Telehealth: Payer: Self-pay

## 2023-07-29 ENCOUNTER — Other Ambulatory Visit: Payer: Self-pay | Admitting: Internal Medicine

## 2023-07-29 DIAGNOSIS — Z6841 Body Mass Index (BMI) 40.0 and over, adult: Secondary | ICD-10-CM

## 2023-07-29 DIAGNOSIS — Z79899 Other long term (current) drug therapy: Secondary | ICD-10-CM

## 2023-07-29 DIAGNOSIS — Z13220 Encounter for screening for lipoid disorders: Secondary | ICD-10-CM

## 2023-07-29 DIAGNOSIS — K5090 Crohn's disease, unspecified, without complications: Secondary | ICD-10-CM

## 2023-07-29 DIAGNOSIS — Z0000 Encounter for general adult medical examination without abnormal findings: Secondary | ICD-10-CM

## 2023-07-29 DIAGNOSIS — R7303 Prediabetes: Secondary | ICD-10-CM

## 2023-07-29 NOTE — Progress Notes (Unsigned)
   Future orders placed. Ok to make lab appt pre cpe  Lab Results  Component Value Date   WBC 10.4 07/08/2023   HGB 14.1 07/08/2023   HCT 44.5 07/08/2023   PLT 341 07/08/2023   GLUCOSE 91 07/08/2023   CHOL 151 12/19/2022   TRIG 47.0 12/19/2022   HDL 45.20 12/19/2022   LDLCALC 96 12/19/2022   ALT 11 07/08/2023   AST 15 07/08/2023   NA 138 07/08/2023   K 4.0 07/08/2023   CL 104 07/08/2023   CREATININE 0.76 07/08/2023   BUN 18 07/08/2023   CO2 29 07/08/2023   TSH 1.050 10/17/2022   INR 1.47 05/26/2009   HGBA1C 5.9 (H) 03/20/2023   Last vitamin D Lab Results  Component Value Date   VD25OH 66.5 03/20/2023

## 2023-07-29 NOTE — Telephone Encounter (Signed)
 Copied from CRM [id number]. Topic: Clinical - Request for Lab/Test Order >> Jul 29, 2023  9:35 AM Corin V wrote: Reason for CRM: Patient would like to have her labs ordered for her physical in July and get scheduled for those as well.

## 2023-07-29 NOTE — Telephone Encounter (Signed)
 Auth Submission: APPROVED Site of care: Site of care: [facility] Payer: [employer] Medication & CPT/J Code(s) submitted: Avsola (infliximab-axxq) [id number] Route of submission (phone, fax, portal): PORTAL Phone # Fax # Auth type: Buy/Bill PB Units/visits requested:5MG /KG Q4WKS Reference number: [id number] Approval from: 06/12/23 to 06/10/24

## 2023-07-30 NOTE — Progress Notes (Signed)
Please other encounter .....

## 2023-07-30 NOTE — Telephone Encounter (Signed)
 Spoke to pt. Lab appt is made.

## 2023-08-07 ENCOUNTER — Ambulatory Visit (INDEPENDENT_AMBULATORY_CARE_PROVIDER_SITE_OTHER): Payer: 59

## 2023-08-07 VITALS — BP 124/73 | HR 82 | Temp 98.0°F | Resp 18 | Ht 62.5 in | Wt 292.6 lb

## 2023-08-07 DIAGNOSIS — K5000 Crohn's disease of small intestine without complications: Secondary | ICD-10-CM

## 2023-08-07 DIAGNOSIS — Z8719 Personal history of other diseases of the digestive system: Secondary | ICD-10-CM | POA: Diagnosis not present

## 2023-08-07 MED ORDER — METHYLPREDNISOLONE SODIUM SUCC 40 MG IJ SOLR: 40.0000 mg | Freq: Once | INTRAMUSCULAR | Status: DC

## 2023-08-07 MED ORDER — ACETAMINOPHEN 325 MG PO TABS: 650.0000 mg | ORAL_TABLET | Freq: Once | ORAL | Status: DC

## 2023-08-07 MED ORDER — SODIUM CHLORIDE 0.9 % IV SOLN
5.0000 mg/kg | Freq: Once | INTRAVENOUS | Status: AC
  Administered 2023-08-07: 700 mg via INTRAVENOUS
  Filled 2023-08-07: qty 70

## 2023-08-07 MED ORDER — DIPHENHYDRAMINE HCL 25 MG PO CAPS
25.0000 mg | ORAL_CAPSULE | Freq: Once | ORAL | Status: DC
Start: 2023-08-07 — End: 2023-08-07

## 2023-08-07 NOTE — Progress Notes (Signed)
 Diagnosis: Crohn's Disease  Provider:  Chilton Greathouse MD  Procedure: IV Infusion  IV Type: Peripheral, IV Location: L Antecubital  Remicade (Infliximab), Dose: 700mg   Infusion Start Time: 1351  Infusion Stop Time: 1611  Post Infusion IV Care: Peripheral IV Discontinued  Discharge: Condition: Good, Destination: Home . AVS Declined  Performed by:  Adriana Mccallum, RN   Patient refused pre-medications. Nurse educated patient and stressed the importance of taking pre-medications as a precaution in the event of a medication reaction. Patient verbalized understanding.

## 2023-08-13 ENCOUNTER — Ambulatory Visit (INDEPENDENT_AMBULATORY_CARE_PROVIDER_SITE_OTHER): Payer: 59 | Admitting: Family Medicine

## 2023-08-14 ENCOUNTER — Other Ambulatory Visit (INDEPENDENT_AMBULATORY_CARE_PROVIDER_SITE_OTHER): Payer: Self-pay | Admitting: Adult Health

## 2023-08-14 DIAGNOSIS — R7303 Prediabetes: Secondary | ICD-10-CM

## 2023-09-04 ENCOUNTER — Ambulatory Visit (INDEPENDENT_AMBULATORY_CARE_PROVIDER_SITE_OTHER)

## 2023-09-04 VITALS — BP 113/72 | HR 92 | Temp 97.5°F | Resp 18 | Ht 62.0 in | Wt 295.0 lb

## 2023-09-04 DIAGNOSIS — K5000 Crohn's disease of small intestine without complications: Secondary | ICD-10-CM

## 2023-09-04 DIAGNOSIS — Z8719 Personal history of other diseases of the digestive system: Secondary | ICD-10-CM

## 2023-09-04 MED ORDER — DIPHENHYDRAMINE HCL 25 MG PO CAPS: 25.0000 mg | ORAL_CAPSULE | Freq: Once | ORAL | Status: DC

## 2023-09-04 MED ORDER — SODIUM CHLORIDE 0.9 % IV SOLN
5.0000 mg/kg | Freq: Once | INTRAVENOUS | Status: AC
  Administered 2023-09-04: 700 mg via INTRAVENOUS
  Filled 2023-09-04: qty 70

## 2023-09-04 MED ORDER — ACETAMINOPHEN 325 MG PO TABS: 650.0000 mg | ORAL_TABLET | Freq: Once | ORAL | Status: DC

## 2023-09-04 MED ORDER — METHYLPREDNISOLONE SODIUM SUCC 40 MG IJ SOLR
40.0000 mg | Freq: Once | INTRAMUSCULAR | Status: DC
Start: 2023-09-04 — End: 2023-09-04

## 2023-09-04 NOTE — Progress Notes (Signed)
 Diagnosis: Crohn's Disease  Provider:  Chilton Greathouse MD  Procedure: IV Infusion  IV Type: Peripheral, IV Location: L Antecubital  Avsola (infliximab-axxq), Dose: 700 mg  Infusion Start Time: 1404  Infusion Stop Time: 1614  Post Infusion IV Care: Peripheral IV Discontinued  Discharge: Condition: Good, Destination: Home . AVS Declined  Performed by:  Wyvonne Lenz, RN

## 2023-09-09 ENCOUNTER — Ambulatory Visit (INDEPENDENT_AMBULATORY_CARE_PROVIDER_SITE_OTHER): Payer: 59 | Admitting: Family Medicine

## 2023-09-10 ENCOUNTER — Encounter (INDEPENDENT_AMBULATORY_CARE_PROVIDER_SITE_OTHER): Payer: Self-pay | Admitting: Family Medicine

## 2023-09-10 ENCOUNTER — Ambulatory Visit (INDEPENDENT_AMBULATORY_CARE_PROVIDER_SITE_OTHER): Admitting: Family Medicine

## 2023-09-10 VITALS — BP 104/67 | HR 87 | Temp 97.7°F | Ht 62.0 in | Wt 288.0 lb

## 2023-09-10 DIAGNOSIS — Z6841 Body Mass Index (BMI) 40.0 and over, adult: Secondary | ICD-10-CM

## 2023-09-10 DIAGNOSIS — E66813 Obesity, class 3: Secondary | ICD-10-CM

## 2023-09-10 DIAGNOSIS — E7849 Other hyperlipidemia: Secondary | ICD-10-CM

## 2023-09-10 DIAGNOSIS — E559 Vitamin D deficiency, unspecified: Secondary | ICD-10-CM | POA: Diagnosis not present

## 2023-09-10 DIAGNOSIS — R7303 Prediabetes: Secondary | ICD-10-CM | POA: Diagnosis not present

## 2023-09-10 MED ORDER — VITAMIN D (ERGOCALCIFEROL) 1.25 MG (50000 UNIT) PO CAPS
50000.0000 [IU] | ORAL_CAPSULE | ORAL | 0 refills | Status: DC
Start: 2023-09-10 — End: 2023-11-18

## 2023-09-10 MED ORDER — METFORMIN HCL 500 MG PO TABS
ORAL_TABLET | ORAL | 0 refills | Status: DC
Start: 2023-09-10 — End: 2023-10-15

## 2023-09-10 NOTE — Progress Notes (Signed)
 SUBJECTIVE:  Chief Complaint: Obesity  Interim History: Patient did well logging initially the first two weeks then struggled to maintain consistency with her journaling.  Mentions she missed a few days and then couldn't get the momentum back to journal. Over the next month she is looking for more accountability.    [name] is here to discuss her progress with her obesity treatment plan. She is on the keeping a food journal and adhering to recommended goals of 1300-1500 calories and 95 grams of protein and states she is following her eating plan approximately 50 % of the time. She states she is not exercising.  OBJECTIVE: Visit Diagnoses: Problem List Items Addressed This Visit       Other   Class 3 severe obesity with serious comorbidity and body mass index (BMI) of 50.0 to 59.9 in adult [facility])   Anthropometric Measurements Height: 5\' 2"  (1.575 m) Weight: 288 lb (130.6 kg) BMI (Calculated): 52.66 Weight at Last Visit: 284 lb Weight Lost Since Last Visit: 0 Weight Gained Since Last Visit: 4 Starting Weight: 293 lb Total Weight Loss (lbs): 6 lb (2.722 kg) Body Composition  Body Fat %: 55.5 % Fat Mass (lbs): 160.2 lbs Muscle Mass (lbs): 122 lbs Visceral Fat Rating : 20 Other Clinical Data Fasting: yes Labs: ? Today's Visit #: 74 Starting Date: 10/02/17 Comments: 1300-1500/95       Relevant Medications   metFORMIN  (GLUCOPHAGE ) 500 MG tablet   Vitamin D  deficiency - Primary   On vitamin d  supplementation previously.  Will order Vitamin D  level today and refill Vitamin D  level today.      Relevant Medications   Vitamin D , Ergocalciferol , (DRISDOL ) 1.25 MG (50000 UNIT) CAPS capsule   Other Relevant Orders   VITAMIN D  25 Hydroxy (Vit-D Deficiency, Fractures) (Completed)   Prediabetes   On metformin  and needs refill today.  No GI side effects mentioned.  Will order CMP, A1c and Insulin  level today.  Will discuss labs at next appointment.      Relevant Medications    metFORMIN  (GLUCOPHAGE ) 500 MG tablet   Other Relevant Orders   Comprehensive metabolic panel with GFR (Completed)   Hemoglobin A1c (Completed)   Insulin , random (Completed)   Other hyperlipidemia   Previously elevated LDL but recent levels better controlled.  Not on medication.  Will repeat FLP today and discuss labs at next appointment.      Relevant Orders   Lipid Panel With LDL/HDL Ratio (Completed)   Other Visit Diagnoses       BMI 50.0-59.9, adult (HCC)       Relevant Medications   metFORMIN  (GLUCOPHAGE ) 500 MG tablet       No data recorded      09/10/2023    7:00 AM 09/04/2023    4:24 PM 09/04/2023    1:25 PM  Vitals with BMI  Height 5\' 2"   5\' 2"   Weight 288 lbs  295 lbs  BMI 52.66  53.94  Systolic 104 113 161  Diastolic 67 72 77  Pulse 87 92 91       ASSESSMENT AND PLAN:  Diet: [name] is currently in the action stage of change. As such, her goal is to continue with weight loss efforts and has agreed to keeping a food journal and adhering to recommended goals of 1300-1500 calories and 90 or more grams of protein daily.   Exercise:  For substantial health benefits, adults should do at least 150 minutes (2 hours and 30 minutes) a week  of moderate-intensity, or 75 minutes (1 hour and 15 minutes) a week of vigorous-intensity aerobic physical activity, or an equivalent combination of moderate- and vigorous-intensity aerobic activity. Aerobic activity should be performed in episodes of at least 10 minutes, and preferably, it should be spread throughout the week.  Behavior Modification:  We discussed the following Behavioral Modification Strategies today: increasing lean protein intake, decreasing simple carbohydrates, meal planning and cooking strategies, keeping healthy foods in the home, and planning for success.   Return in about 4 weeks (around 10/08/2023).[name] She was informed of the importance of frequent follow up visits to maximize her success with intensive  lifestyle modifications for her multiple health conditions.  Attestation Statements:   Reviewed by clinician on day of visit: allergies, medications, problem list, medical history, surgical history, family history, social history, and previous encounter notes.     Donaciano Frizzle, MD

## 2023-09-11 LAB — COMPREHENSIVE METABOLIC PANEL WITH GFR
ALT: 11 IU/L (ref 0–32)
AST: 13 IU/L (ref 0–40)
Albumin: 3.8 g/dL — ABNORMAL LOW (ref 3.9–4.9)
Alkaline Phosphatase: 90 IU/L (ref 44–121)
BUN/Creatinine Ratio: 18 (ref 9–23)
BUN: 14 mg/dL (ref 6–24)
Bilirubin Total: 0.2 mg/dL (ref 0.0–1.2)
CO2: 24 mmol/L (ref 20–29)
Calcium: 9.4 mg/dL (ref 8.7–10.2)
Chloride: 100 mmol/L (ref 96–106)
Creatinine, Ser: 0.76 mg/dL (ref 0.57–1.00)
Globulin, Total: 3.1 g/dL (ref 1.5–4.5)
Glucose: 80 mg/dL (ref 70–99)
Potassium: 4.5 mmol/L (ref 3.5–5.2)
Sodium: 139 mmol/L (ref 134–144)
Total Protein: 6.9 g/dL (ref 6.0–8.5)
eGFR: 102 mL/min/{1.73_m2} (ref >59)

## 2023-09-11 LAB — LIPID PANEL WITH LDL/HDL RATIO
Cholesterol, Total: 155 mg/dL (ref 100–199)
HDL: 54 mg/dL (ref >39)
LDL Chol Calc (NIH): 91 mg/dL (ref 0–99)
LDL/HDL Ratio: 1.7 ratio (ref 0.0–3.2)
Triglycerides: 48 mg/dL (ref 0–149)
VLDL Cholesterol Cal: 10 mg/dL (ref 5–40)

## 2023-09-11 LAB — HEMOGLOBIN A1C
Est. average glucose Bld gHb Est-mCnc: 120 mg/dL
Hgb A1c MFr Bld: 5.8 % — ABNORMAL HIGH (ref 4.8–5.6)

## 2023-09-11 LAB — INSULIN, RANDOM: INSULIN: 7.6 u[IU]/mL (ref 2.6–24.9)

## 2023-09-11 LAB — VITAMIN D 25 HYDROXY (VIT D DEFICIENCY, FRACTURES): Vit D, 25-Hydroxy: 49.7 ng/mL (ref 30.0–100.0)

## 2023-09-18 DIAGNOSIS — E7849 Other hyperlipidemia: Secondary | ICD-10-CM | POA: Insufficient documentation

## 2023-09-18 NOTE — Assessment & Plan Note (Signed)
 On metformin  and needs refill today.  No GI side effects mentioned.  Will order CMP, A1c and Insulin  level today.  Will discuss labs at next appointment.

## 2023-09-18 NOTE — Assessment & Plan Note (Signed)
 Previously elevated LDL but recent levels better controlled.  Not on medication.  Will repeat FLP today and discuss labs at next appointment.

## 2023-09-18 NOTE — Assessment & Plan Note (Signed)
 Anthropometric Measurements Height: 5\' 2"  (1.575 m) Weight: 288 lb (130.6 kg) BMI (Calculated): 52.66 Weight at Last Visit: 284 lb Weight Lost Since Last Visit: 0 Weight Gained Since Last Visit: 4 Starting Weight: 293 lb Total Weight Loss (lbs): 6 lb (2.722 kg) Body Composition  Body Fat %: 55.5 % Fat Mass (lbs): 160.2 lbs Muscle Mass (lbs): 122 lbs Visceral Fat Rating : 20 Other Clinical Data Fasting: yes Labs: ? Today's Visit #: 74 Starting Date: 10/02/17 Comments: 1300-1500/95

## 2023-09-18 NOTE — Assessment & Plan Note (Addendum)
 On vitamin d  supplementation previously.  Will order Vitamin D  level today and refill Vitamin D  level today.

## 2023-10-04 ENCOUNTER — Ambulatory Visit

## 2023-10-11 ENCOUNTER — Ambulatory Visit

## 2023-10-11 VITALS — BP 113/69 | HR 85 | Temp 98.7°F | Resp 16 | Ht 62.0 in | Wt 298.2 lb

## 2023-10-11 DIAGNOSIS — K5000 Crohn's disease of small intestine without complications: Secondary | ICD-10-CM | POA: Diagnosis not present

## 2023-10-11 DIAGNOSIS — Z8719 Personal history of other diseases of the digestive system: Secondary | ICD-10-CM | POA: Diagnosis not present

## 2023-10-11 MED ORDER — SODIUM CHLORIDE 0.9 % IV SOLN
5.0000 mg/kg | Freq: Once | INTRAVENOUS | Status: AC
  Administered 2023-10-11: 700 mg via INTRAVENOUS
  Filled 2023-10-11: qty 70

## 2023-10-11 MED ORDER — METHYLPREDNISOLONE SODIUM SUCC 40 MG IJ SOLR
40.0000 mg | Freq: Once | INTRAMUSCULAR | Status: DC
Start: 2023-10-11 — End: 2023-10-11

## 2023-10-11 MED ORDER — DIPHENHYDRAMINE HCL 25 MG PO CAPS: 25.0000 mg | ORAL_CAPSULE | Freq: Once | ORAL | Status: DC

## 2023-10-11 MED ORDER — ACETAMINOPHEN 325 MG PO TABS: 650.0000 mg | ORAL_TABLET | Freq: Once | ORAL | Status: DC

## 2023-10-11 NOTE — Progress Notes (Signed)
 Diagnosis: Crohn's Disease  Provider:  Praveen Mannam MD  Procedure: IV Infusion  IV Type: Peripheral, IV Location: L Forearm  Avsola  (infliximab -axxq), Dose: 700mg   Infusion Start Time: 1349  Infusion Stop Time: 1608  Post Infusion IV Care: Peripheral IV Discontinued  Discharge: Condition: Good, Destination: Home . AVS Declined  Performed by:  Rachelle Bue, RN

## 2023-10-14 ENCOUNTER — Other Ambulatory Visit (INDEPENDENT_AMBULATORY_CARE_PROVIDER_SITE_OTHER): Payer: Self-pay | Admitting: Family Medicine

## 2023-10-14 ENCOUNTER — Encounter: Payer: Self-pay | Admitting: Gastroenterology

## 2023-10-14 DIAGNOSIS — R7303 Prediabetes: Secondary | ICD-10-CM

## 2023-10-15 ENCOUNTER — Ambulatory Visit (INDEPENDENT_AMBULATORY_CARE_PROVIDER_SITE_OTHER): Admitting: Family Medicine

## 2023-10-15 ENCOUNTER — Encounter (INDEPENDENT_AMBULATORY_CARE_PROVIDER_SITE_OTHER): Payer: Self-pay | Admitting: Family Medicine

## 2023-10-15 VITALS — BP 107/70 | HR 87 | Temp 98.2°F | Ht 62.0 in | Wt 293.0 lb

## 2023-10-15 DIAGNOSIS — R7303 Prediabetes: Secondary | ICD-10-CM | POA: Diagnosis not present

## 2023-10-15 DIAGNOSIS — E66813 Obesity, class 3: Secondary | ICD-10-CM

## 2023-10-15 DIAGNOSIS — Z6841 Body Mass Index (BMI) 40.0 and over, adult: Secondary | ICD-10-CM

## 2023-10-15 MED ORDER — METFORMIN HCL 500 MG PO TABS
ORAL_TABLET | ORAL | 0 refills | Status: DC
Start: 2023-10-15 — End: 2023-12-09

## 2023-10-15 NOTE — Telephone Encounter (Signed)
 I think in the setting of her disease state being in equilibrium, it is OK to push out her Avsola  by no more than 2-weeks, so that she may have surgery and minimize issues of wound healing. Thanks. GM

## 2023-10-15 NOTE — Progress Notes (Unsigned)
 SUBJECTIVE:  Chief Complaint: Obesity  Interim History: Patient voices she has not journaled over the last month.  She has plans to journal and started a week ago.  Breakfast and lunch have been more in line with her food goals.  Dinner has been still an area she can be more conscious of choices and she has noticed more sugar cravings.  She has more cravings especially in the evenings. For Memorial Day she is having a cookout and June is going to be less hectic than what it is now.  June she also has wisdom teeth removal and then vacation.   Jenia is here to discuss her progress with her obesity treatment plan. She is on the keeping a food journal and adhering to recommended goals of 1300-1500 calories and 90 grams of protein and states she is following her eating plan approximately 25 % of the time. She states she is not exercising.   OBJECTIVE: Visit Diagnoses: Problem List Items Addressed This Visit       Other   Prediabetes   Relevant Medications   metFORMIN  (GLUCOPHAGE ) 500 MG tablet    Vitals Temp: 98.2 F (36.8 C) BP: 107/70 Pulse Rate: 87 SpO2: 97 %   Anthropometric Measurements Height: 5\' 2"  (1.575 m) Weight: 293 lb (132.9 kg) BMI (Calculated): 53.58 Weight at Last Visit: 288 lb Weight Lost Since Last Visit: 0 Weight Gained Since Last Visit: 5 Starting Weight: 293 lb Total Weight Loss (lbs): 0 lb (0 kg)   Body Composition  Body Fat %: 53.4 % Fat Mass (lbs): 156.6 lbs Muscle Mass (lbs): 129.8 lbs Visceral Fat Rating : 20   Other Clinical Data Fasting: no Labs: no Today's Visit #: 75 Starting Date: 10/02/17 Comments: 1300-1500/95     ASSESSMENT AND PLAN:  Diet: Tomi is currently in the action stage of change. As such, her goal is to continue with weight loss efforts and has agreed to keeping a food journal and adhering to recommended goals of 1300-1400 calories and 90 or more grams of protein daily.   Patient to start food log or journaling  meal plan.  The initial goal will be to habitually log or journal for at least 4 days a week.  The expectation it that patient may not initially meet calorie or protein goals as the nutritional understanding of food intake is begun.  We discussed the 10:1 ratio when reading a food label.  Patient agrees to keep a food log either electronically or on paper and bring to the next appointment to be able to dissect and discuss it with provider.    Exercise:  For substantial health benefits, adults should do at least 150 minutes (2 hours and 30 minutes) a week of moderate-intensity, or 75 minutes (1 hour and 15 minutes) a week of vigorous-intensity aerobic physical activity, or an equivalent combination of moderate- and vigorous-intensity aerobic activity. Aerobic activity should be performed in episodes of at least 10 minutes, and preferably, it should be spread throughout the week.  Behavior Modification:  We discussed the following Behavioral Modification Strategies today: increasing lean protein intake, decreasing simple carbohydrates, increasing vegetables, meal planning and cooking strategies, keeping healthy foods in the home, and planning for success.   Return in about 4 weeks (around 11/12/2023).   She was informed of the importance of frequent follow up visits to maximize her success with intensive lifestyle modifications for her multiple health conditions.  Attestation Statements:   Reviewed by clinician on day of visit: allergies,  medications, problem list, medical history, surgical history, family history, social history, and previous encounter notes.     Donaciano Frizzle, MD

## 2023-10-21 NOTE — Assessment & Plan Note (Signed)
 Anthropometric Measurements Height: 5\' 2"  (1.575 m) Weight: 293 lb (132.9 kg) BMI (Calculated): 53.58 Weight at Last Visit: 288 lb Weight Lost Since Last Visit: 0 Weight Gained Since Last Visit: 5 Starting Weight: 293 lb Total Weight Loss (lbs): 0 lb (0 kg) Body Composition  Body Fat %: 53.4 % Fat Mass (lbs): 156.6 lbs Muscle Mass (lbs): 129.8 lbs Visceral Fat Rating : 20 Other Clinical Data Fasting: no Labs: no Today's Visit #: 75 Starting Date: 10/02/17 Comments: 1300-1500/95

## 2023-10-21 NOTE — Assessment & Plan Note (Signed)
 On metformin  daily.  No GI side effects reported.  Needs a refill today- no change in dose.  Last A1c discussed from prior visit- A1c 0.1 worse than previously.  Patient to work on limiting simple carbohydrates and increasing total protein intake.

## 2023-10-31 NOTE — Telephone Encounter (Signed)
 Please review

## 2023-11-08 ENCOUNTER — Ambulatory Visit (INDEPENDENT_AMBULATORY_CARE_PROVIDER_SITE_OTHER)

## 2023-11-08 VITALS — BP 112/75 | HR 78 | Temp 97.7°F | Resp 24 | Ht 63.0 in | Wt 300.8 lb

## 2023-11-08 DIAGNOSIS — K5000 Crohn's disease of small intestine without complications: Secondary | ICD-10-CM

## 2023-11-08 DIAGNOSIS — Z8719 Personal history of other diseases of the digestive system: Secondary | ICD-10-CM

## 2023-11-08 MED ORDER — ACETAMINOPHEN 325 MG PO TABS: 650.0000 mg | ORAL_TABLET | Freq: Once | ORAL | Status: DC

## 2023-11-08 MED ORDER — METHYLPREDNISOLONE SODIUM SUCC 40 MG IJ SOLR: 40.0000 mg | Freq: Once | INTRAMUSCULAR | Status: DC

## 2023-11-08 MED ORDER — SODIUM CHLORIDE 0.9 % IV SOLN
5.0000 mg/kg | Freq: Once | INTRAVENOUS | Status: AC
  Administered 2023-11-08: 700 mg via INTRAVENOUS
  Filled 2023-11-08: qty 70

## 2023-11-08 MED ORDER — DIPHENHYDRAMINE HCL 25 MG PO CAPS: 25.0000 mg | ORAL_CAPSULE | Freq: Once | ORAL | Status: DC

## 2023-11-08 NOTE — Progress Notes (Signed)
 Diagnosis: Crohn's Disease  Provider:  Praveen Mannam MD  Procedure: IV Infusion  IV Type: Peripheral, IV Location: L Antecubital  Avsola  (infliximab -axxq), Dose: 700mg   Infusion Start Time: 0935  Infusion Stop Time: 1148  Post Infusion IV Care: Peripheral IV Discontinued  Discharge: Condition: Good, Destination: Home . AVS Declined  Performed by:  Star East, LPN

## 2023-11-18 ENCOUNTER — Ambulatory Visit (INDEPENDENT_AMBULATORY_CARE_PROVIDER_SITE_OTHER): Admitting: Family Medicine

## 2023-11-18 ENCOUNTER — Encounter (INDEPENDENT_AMBULATORY_CARE_PROVIDER_SITE_OTHER): Payer: Self-pay | Admitting: Family Medicine

## 2023-11-18 VITALS — BP 113/81 | HR 89 | Temp 98.2°F | Ht 62.0 in | Wt 292.0 lb

## 2023-11-18 DIAGNOSIS — Z6841 Body Mass Index (BMI) 40.0 and over, adult: Secondary | ICD-10-CM | POA: Diagnosis not present

## 2023-11-18 DIAGNOSIS — E559 Vitamin D deficiency, unspecified: Secondary | ICD-10-CM

## 2023-11-18 DIAGNOSIS — R7303 Prediabetes: Secondary | ICD-10-CM

## 2023-11-18 MED ORDER — VITAMIN D (ERGOCALCIFEROL) 1.25 MG (50000 UNIT) PO CAPS: 50000.0000 [IU] | ORAL_CAPSULE | ORAL | 0 refills | Status: DC

## 2023-11-18 NOTE — Progress Notes (Signed)
 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 She has been consistent with her food logging.  Last seven days protein was at 710 and average was 1896.  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  That makes things easier and more difficult for her.  Has been being more conscious and in control of intake since last week.  She stopped doing muffins at breakfast and switched to tortilla.   [name] is here to discuss her progress with her obesity treatment plan. She is on the keeping a food journal and adhering to recommended goals of 1300-1400 calories and 90 protein and states she is following her eating plan approximately 80 % of the time. She states she is not exercising.  OBJECTIVE: Visit Diagnoses: Problem List Items Addressed This Visit       Other   Vitamin D  deficiency   On vitamin d  supplementation previously.  Last vitamin D  level below goal- refill Vitamin D  level today.      Relevant Medications   Vitamin D , Ergocalciferol , (DRISDOL ) 1.25 MG (50000 UNIT) CAPS capsule   Prediabetes - Primary   Recent A1c still in prediabetic range- patient working on limiting simple carbohydrates and increasing total protein intake.  Will need follow up labs in 2 months.      Morbid obesity (HCC)   Anthropometric Measurements Height: 5' 2 (1.575 m) Weight: 292 lb (132.5 kg) BMI (Calculated): 53.39 Weight at Last Visit: 293 lb Weight Lost Since Last Visit: 1 lb Weight Gained Since Last Visit: 0 Starting Weight: 293 lb Total Weight Loss (lbs): 1 lb (0.454 kg) Peak Weight: 295 lb Body Composition  Body Fat %: 53.7 % Fat Mass (lbs): 157 lbs Muscle Mass (lbs): 128.4 lbs Visceral Fat Rating : 20 Other Clinical Data Fasting: no Labs: no Today's Visit #: 76 Starting Date: 10/02/17       Other Visit Diagnoses       BMI 50.0-59.9, adult (HCC)           No data  recorded       11/26/2023   10:03 AM 11/18/2023   10:00 AM 11/08/2023   11:47 AM  Vitals with BMI  Height  5' 2   Weight 304 lbs 292 lbs   BMI 55.59 53.39   Systolic 130 113 887  Diastolic 73 81 75  Pulse 81 89 78      ASSESSMENT AND PLAN:  Diet: [name] is currently in the action stage of change. As such, her goal is to continue with weight loss efforts and has agreed to keeping a food journal and adhering to recommended goals of 1300-1400 calories and 90 or more grams of protein daily.   Exercise:  For substantial health benefits, adults should do at least 150 minutes (2 hours and 30 minutes) a week of moderate-intensity, or 75 minutes (1 hour and 15 minutes) a week of vigorous-intensity aerobic physical activity, or an equivalent combination of moderate- and vigorous-intensity aerobic activity. Aerobic activity should be performed in episodes of at least 10 minutes, and preferably, it should be spread throughout the week.  Behavior Modification:  We discussed the following Behavioral Modification Strategies today: increasing lean protein intake, decreasing simple carbohydrates, increasing vegetables, meal planning and cooking strategies, and planning for success.   Return in about 4 weeks (around 12/16/2023).   She was informed of the importance of frequent follow up  visits to maximize her success with intensive lifestyle modifications for her multiple health conditions.  Attestation Statements:   Reviewed by clinician on day of visit: allergies, medications, problem list, medical history, surgical history, family history, social history, and previous encounter notes.     Adelita Cho, MD

## 2023-11-22 ENCOUNTER — Other Ambulatory Visit (INDEPENDENT_AMBULATORY_CARE_PROVIDER_SITE_OTHER): Payer: Self-pay | Admitting: Family Medicine

## 2023-11-22 DIAGNOSIS — R7303 Prediabetes: Secondary | ICD-10-CM

## 2023-11-25 ENCOUNTER — Other Ambulatory Visit: Payer: Self-pay | Admitting: *Deleted

## 2023-11-25 ENCOUNTER — Telehealth: Payer: Self-pay

## 2023-11-25 DIAGNOSIS — D509 Iron deficiency anemia, unspecified: Secondary | ICD-10-CM

## 2023-11-25 NOTE — Telephone Encounter (Signed)
 Left message to confirm appt for 7/1

## 2023-11-26 ENCOUNTER — Encounter: Payer: Self-pay | Admitting: Hematology and Oncology

## 2023-11-26 ENCOUNTER — Ambulatory Visit: Payer: Self-pay | Admitting: Hematology and Oncology

## 2023-11-26 ENCOUNTER — Inpatient Hospital Stay: Payer: 59 | Attending: Hematology and Oncology

## 2023-11-26 ENCOUNTER — Inpatient Hospital Stay: Payer: 59 | Admitting: Hematology and Oncology

## 2023-11-26 VITALS — BP 130/73 | HR 81 | Temp 98.4°F | Resp 17 | Wt 304.0 lb

## 2023-11-26 DIAGNOSIS — D509 Iron deficiency anemia, unspecified: Secondary | ICD-10-CM | POA: Insufficient documentation

## 2023-11-26 LAB — CBC WITH DIFFERENTIAL (CANCER CENTER ONLY)
Abs Immature Granulocytes: 0.05 10*3/uL (ref 0.00–0.07)
Basophils Absolute: 0 10*3/uL (ref 0.0–0.1)
Basophils Relative: 0 %
Eosinophils Absolute: 0.2 10*3/uL (ref 0.0–0.5)
Eosinophils Relative: 2 %
HCT: 39.6 % (ref 36.0–46.0)
Hemoglobin: 12.9 g/dL (ref 12.0–15.0)
Immature Granulocytes: 1 %
Lymphocytes Relative: 25 %
Lymphs Abs: 2.8 10*3/uL (ref 0.7–4.0)
MCH: 29.6 pg (ref 26.0–34.0)
MCHC: 32.6 g/dL (ref 30.0–36.0)
MCV: 90.8 fL (ref 80.0–100.0)
Monocytes Absolute: 0.9 10*3/uL (ref 0.1–1.0)
Monocytes Relative: 9 %
Neutro Abs: 7.1 10*3/uL (ref 1.7–7.7)
Neutrophils Relative %: 63 %
Platelet Count: 323 10*3/uL (ref 150–400)
RBC: 4.36 MIL/uL (ref 3.87–5.11)
RDW: 14.6 % (ref 11.5–15.5)
WBC Count: 11.1 10*3/uL — ABNORMAL HIGH (ref 4.0–10.5)
nRBC: 0 % (ref 0.0–0.2)

## 2023-11-26 LAB — CMP (CANCER CENTER ONLY)
ALT: 12 U/L (ref 0–44)
AST: 13 U/L — ABNORMAL LOW (ref 15–41)
Albumin: 3.5 g/dL (ref 3.5–5.0)
Alkaline Phosphatase: 62 U/L (ref 38–126)
Anion gap: 4 — ABNORMAL LOW (ref 5–15)
BUN: 11 mg/dL (ref 6–20)
CO2: 32 mmol/L (ref 22–32)
Calcium: 9.1 mg/dL (ref 8.9–10.3)
Chloride: 102 mmol/L (ref 98–111)
Creatinine: 0.66 mg/dL (ref 0.44–1.00)
GFR, Estimated: >60 mL/min (ref >60)
Glucose, Bld: 103 mg/dL — ABNORMAL HIGH (ref 70–99)
Potassium: 3.7 mmol/L (ref 3.5–5.1)
Sodium: 138 mmol/L (ref 135–145)
Total Bilirubin: 0.3 mg/dL (ref 0.0–1.2)
Total Protein: 7 g/dL (ref 6.5–8.1)

## 2023-11-26 LAB — FERRITIN: Ferritin: 31 ng/mL (ref 11–307)

## 2023-11-26 LAB — IRON AND IRON BINDING CAPACITY (CC-WL,HP ONLY)
Iron: 59 ug/dL (ref 28–170)
Saturation Ratios: 23 % (ref 10.4–31.8)
TIBC: 255 ug/dL (ref 250–450)
UIBC: 196 ug/dL (ref 148–442)

## 2023-11-26 NOTE — Progress Notes (Signed)
 [facility] CONSULT NOTE  Patient Care Team: Mannam, Praveen, MD as PCP - General (Pulmonary Disease) Teressa Toribio SQUIBB, MD (Inactive) (Gastroenterology) Henry Slough, MD (Obstetrics and Gynecology) Mai Lynwood FALCON, MD (Rheumatology) Delores Clayborne LABOR, DO as Consulting Physician (Bariatrics) Charlanne Groom, MD as Consulting Physician (Gastroenterology) Loretha Ash, [name] Loretha Ash, MD as Consulting Physician (Hematology and Oncology)  CHIEF COMPLAINTS/PURPOSE OF CONSULTATION:  Anemia  ASSESSMENT & PLAN:   This is a very pleasant 43 year old female patient with Crohn's disease, severe anemia with multiple ER visits and hospitalizations recently, currently on infliximab  for the past several years with great response referred to hematology here for follow up regarding severe anemia.   Assessment and Plan Assessment & Plan Iron  deficiency anemia Managed with oral iron . Hemoglobin normal at 12.9 g/dL. Awaiting iron  panel and ferritin results. Discussed potential IV iron  if oral iron  is inadequate. - Continue oral iron  65 mg once daily. - Review iron  panel and ferritin results when available. - Consider IV iron  if ferritin decreases or oral iron  is inadequate.  Crohn's disease Well-controlled, no exacerbations since 2023. Scheduled for colonoscopy next month. Continues on infliximab . - Continue infliximab  therapy. - Proceed with scheduled colonoscopy next month.  HISTORY OF PRESENTING ILLNESS:  [name] 42 y.o. female is here because of anemia and thrombocytosis.  Discussed the use of AI scribe software for clinical note transcription with the patient, who gave verbal consent to proceed.  History of Present Illness [name] is a 43 year old female with Crohn's disease who presents for a follow-up visit.  Her Crohn's disease is well-controlled with no major exacerbations since 2023. She is scheduled for  a colonoscopy next month. She continues to take infliximab  for her condition.  She is currently taking iron  supplements, 65 mg twice a day, but has missed a few doses, taking at least one pill a day. Her hemoglobin level is 12.9, which is within the normal range. She menstruates regularly, contributing to iron  loss. No symptoms such as brittle nails or ice cravings.  Her white blood cell count is slightly elevated. Her metabolic panel shows normal kidney and liver function. Results of her iron  panel and ferritin levels are pending.  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  Rest of the pertinent 10 point ROS reviewed and negative  MEDICAL HISTORY:  Past Medical History:  Diagnosis Date   Abrasion of upper arm with infection, left, sequela    started antibiotics lef arm oct 4th started antibiotics healing well   Anemia    hx of 2 yrs ago   Blood transfusion without reported diagnosis    Chicken pox    Chronic rhinitis    Contact dermatitis and other eczema, due to unspecified cause    Crohn's 11/15/2011   no issues in 2 yrs   Fibroids 11/15/2011   remains with fibroids   Hernia    Incisional hernia, RLQ 10/02/2011   Infertility, female    Mononucleosis    Obesity    Pilonidal abscess    Rectal abscess    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HCC)    Vitamin D  deficiency     SURGICAL HISTORY: Past Surgical History:  Procedure Laterality Date   APPENDECTOMY  11/2004   BIOPSY  01/25/2022   Procedure: BIOPSY;  Surgeon: Charlanne Groom, MD;  Location: [facility];  Service: Gastroenterology;;   BIOPSY  01/26/2022  Procedure: BIOPSY;  Surgeon: Charlanne Groom, MD;  Location: [facility];  Service: Gastroenterology;;   BIOPSY  01/24/2023   Procedure: BIOPSY;  Surgeon: Wilhelmenia Aloha Raddle., MD;  Location: [facility];  Service: Gastroenterology;;   CESAREAN SECTION N/A 11/30/2020   Procedure: CESAREAN SECTION;   Surgeon: Henry Slough, MD;  Location: [facility];  Service: Obstetrics;  Laterality: N/A;   COLONOSCOPY     COLONOSCOPY WITH PROPOFOL  N/A 03/07/2017   Procedure: COLONOSCOPY WITH PROPOFOL ;  Surgeon: Teressa Toribio SQUIBB, MD;  Location: [facility];  Service: Endoscopy;  Laterality: N/A;   COLONOSCOPY WITH PROPOFOL  N/A 01/26/2022   Procedure: COLONOSCOPY WITH PROPOFOL ;  Surgeon: Charlanne Groom, MD;  Location: [facility];  Service: Gastroenterology;  Laterality: N/A;   COLONOSCOPY WITH PROPOFOL  N/A 01/24/2023   Procedure: COLONOSCOPY WITH PROPOFOL ;  Surgeon: Wilhelmenia Aloha Raddle., MD;  Location: [facility];  Service: Gastroenterology;  Laterality: N/A;   ESOPHAGOGASTRODUODENOSCOPY (EGD) WITH PROPOFOL  N/A 01/25/2022   Procedure: ESOPHAGOGASTRODUODENOSCOPY (EGD) WITH PROPOFOL ;  Surgeon: Charlanne Groom, MD;  Location: [facility];  Service: Gastroenterology;  Laterality: N/A;   EYE SURGERY  11/15/2011   2003-multiple stys   ileocecal resection  05/2009   crohns disease with fisula/stricture. Dr. Jeoffrey Dawn   INCISIONAL HERNIA REPAIR N/A 12/24/2018   Procedure: OPEN REPAIR VENTRAL INCISIONAL HERNIA WITH MESH PATCH AND PRIMARY REPAIR OF LEFT SPIGELIAN HERNIA;  Surgeon: Eletha Boas, MD;  Location: [facility];  Service: General;  Laterality: N/A;   KNEE ARTHROSCOPY  99, 02   Bil.   MYOMECTOMY N/A 11/10/2014   Procedure: MYOMECTOMY;  Surgeon: Slough Henry, MD;  Location: [facility];  Service: Gynecology;  Laterality: N/A;   PILONIDAL CYST EXCISION  2011, 2012   I&Ds   UPPER GASTROINTESTINAL ENDOSCOPY     VENTRAL HERNIA REPAIR  11/27/2011   Procedure: LAPAROSCOPIC VENTRAL HERNIA;  Surgeon: Elspeth KYM Schultze, MD;  Location: [facility];  Service: General;  Laterality: N/A;  Laparoscopic Exploration & Repair of Hernia in Abdomen   WISDOM TOOTH EXTRACTION      SOCIAL HISTORY: Social History   Socioeconomic History   Marital status: Married    Spouse name: Naliyah Neth   Number of children: Not on file    Years of education: Not on file   Highest education level: Bachelor's degree (e.g., BA, AB, BS)  Occupational History   Occupation: Editor, commissioning  Tobacco Use   Smoking status: Never   Smokeless tobacco: Never  Vaping Use   Vaping status: Never Used  Substance and Sexual Activity   Alcohol use: No   Drug use: No   Sexual activity: Yes    Partners: Male  Other Topics Concern   Not on file  Social History Narrative   married   HH of 2   Occupation: Editor, 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    No pets   Sleep 6-7 hours    BF with 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   Walking exercise   Eats balanced generally   Social Drivers of Health   Financial Resource Strain: Low Risk  (09/11/2022)   Overall Financial Resource Strain (CARDIA)    Difficulty of Paying Living Expenses: Not hard at all  Food Insecurity: No Food Insecurity (09/11/2022)   Hunger Vital Sign    Worried About Running Out of Food in the Last Year: Never true    Ran Out of Food in the Last Year: Never true  Transportation Needs: No Transportation Needs (09/11/2022)   PRAPARE - Transportation  Lack of Transportation (Medical): No    Lack of Transportation (Non-Medical): No  Physical Activity: Insufficiently Active (12/19/2022)   Exercise Vital Sign    Days of Exercise per Week: 1 day    Minutes of Exercise per Session: 20 min  Stress: No Stress Concern Present (09/11/2022)   [employer] of Occupational Health - Occupational Stress Questionnaire    Feeling of Stress : Only a little  Social Connections: Socially Integrated (12/19/2022)   Social Connection and Isolation Panel    Frequency of Communication with Friends and Family: More than three times a week    Frequency of Social Gatherings with Friends and Family: Not on file    Attends Religious Services: More than 4 times per year    Active Member of [employer] or Organizations: Yes    Attends Engineer, structural: More than 4 times per year    Marital  Status: Married  Catering manager Violence: Not At Risk (12/19/2022)   Humiliation, Afraid, Rape, and Kick questionnaire    Fear of Current or Ex-Partner: No    Emotionally Abused: No    Physically Abused: No    Sexually Abused: No    FAMILY HISTORY: Family History  Problem Relation Age of Onset   Aneurysm Mother        brain   Heart disease Mother 47       heart anyrism   Healthy Father        fairly   Diabetes Sister        half sister   Bipolar disorder Sister        half sister   Multiple sclerosis Sister    Stomach cancer Paternal Grandfather    Esophageal cancer Neg Hx    Inflammatory bowel disease Neg Hx    Liver disease Neg Hx    Pancreatic cancer Neg Hx    Rectal cancer Neg Hx    Colon cancer Neg Hx     ALLERGIES:  is allergic to ibuprofen  and naproxen .  MEDICATIONS:  Current Outpatient Medications  Medication Sig Dispense Refill   Calcium  Carb-Cholecalciferol  (CALCIUM /VITAMIN D ) 600-400 MG-UNIT TABS Take 1 tablet by mouth at bedtime.     ferrous gluconate  (FERGON) 324 MG tablet Take 2 tablets (648 mg total) by mouth daily with breakfast. 180 tablet 1   fexofenadine (ALLEGRA) 180 MG tablet Take 180 mg by mouth at bedtime.     inFLIXimab -axxq (AVSOLA ) 100 MG injection Inject 1 mg into the vein. Every month     metFORMIN  (GLUCOPHAGE ) 500 MG tablet 1 full tab daily with breakfast and 1 at lunch 60 tablet 0   Vitamin D , Ergocalciferol , (DRISDOL ) 1.25 MG (50000 UNIT) CAPS capsule Take 1 capsule (50,000 Units total) by mouth every 14 (fourteen) days. 8 capsule 0   No current facility-administered medications for this visit.   Facility-Administered Medications Ordered in Other Visits  Medication Dose Route Frequency Provider Last Rate Last Admin   acetaminophen  (TYLENOL ) tablet 650 mg  650 mg Oral Once Mansouraty, Aloha Raddle., MD         PHYSICAL EXAMINATION: ECOG PERFORMANCE STATUS: 0 - Asymptomatic  Vitals:   11/26/23 1003  BP: 130/73  Pulse: 81  Resp: 17   Temp: 98.4 F (36.9 C)  SpO2: 100%    Filed Weights   11/26/23 1003  Weight: (!) 304 lb (137.9 kg)     Physical Exam Constitutional:      Appearance: Normal appearance.   Cardiovascular:     Rate  and Rhythm: Normal rate.     Pulses: Normal pulses.  Pulmonary:     Effort: Pulmonary effort is normal.   Musculoskeletal:        General: Normal range of motion.     Cervical back: Normal range of motion. No rigidity.  Lymphadenopathy:     Cervical: No cervical adenopathy.   Skin:    General: Skin is warm and dry.   Neurological:     General: No focal deficit present.     Mental Status: She is alert.       LABORATORY DATA:  I have reviewed the data as listed Lab Results  Component Value Date   WBC 11.1 (H) 11/26/2023   HGB 12.9 11/26/2023   HCT 39.6 11/26/2023   MCV 90.8 11/26/2023   PLT 323 11/26/2023     Chemistry      Component Value Date/Time   NA 138 11/26/2023 0922   NA 139 09/10/2023 0824   K 3.7 11/26/2023 0922   CL 102 11/26/2023 0922   CO2 32 11/26/2023 0922   BUN 11 11/26/2023 0922   BUN 14 09/10/2023 0824   CREATININE 0.66 11/26/2023 0922      Component Value Date/Time   CALCIUM  9.1 11/26/2023 0922   CALCIUM  7.6 (L) 01/25/2022 0328   ALKPHOS 62 11/26/2023 0922   AST 13 (L) 11/26/2023 0922   ALT 12 11/26/2023 0922   BILITOT 0.3 11/26/2023 0922       RADIOGRAPHIC STUDIES: I have personally reviewed the radiological images as listed and agreed with the findings in the report. No results found.  All questions were answered. The patient knows to call the clinic with any problems, questions or concerns. I spent 20 minutes in the care of this patient including H and P, review of records, counseling and coordination of care.     Amber Stalls, MD 11/26/2023 10:12 AM

## 2023-11-30 NOTE — Assessment & Plan Note (Signed)
 Anthropometric Measurements Height: 5' 2 (1.575 m) Weight: 292 lb (132.5 kg) BMI (Calculated): 53.39 Weight at Last Visit: 293 lb Weight Lost Since Last Visit: 1 lb Weight Gained Since Last Visit: 0 Starting Weight: 293 lb Total Weight Loss (lbs): 1 lb (0.454 kg) Peak Weight: 295 lb Body Composition  Body Fat %: 53.7 % Fat Mass (lbs): 157 lbs Muscle Mass (lbs): 128.4 lbs Visceral Fat Rating : 20 Other Clinical Data Fasting: no Labs: no Today's Visit #: 76 Starting Date: 10/02/17

## 2023-11-30 NOTE — Assessment & Plan Note (Signed)
 On vitamin d  supplementation previously.  Last vitamin D  level below goal- refill Vitamin D  level today.

## 2023-11-30 NOTE — Assessment & Plan Note (Signed)
 Recent A1c still in prediabetic range- patient working on limiting simple carbohydrates and increasing total protein intake.  Will need follow up labs in 2 months.

## 2023-12-06 ENCOUNTER — Other Ambulatory Visit (INDEPENDENT_AMBULATORY_CARE_PROVIDER_SITE_OTHER): Payer: Self-pay | Admitting: Family Medicine

## 2023-12-06 DIAGNOSIS — E559 Vitamin D deficiency, unspecified: Secondary | ICD-10-CM

## 2023-12-09 ENCOUNTER — Encounter (INDEPENDENT_AMBULATORY_CARE_PROVIDER_SITE_OTHER): Payer: Self-pay | Admitting: Family Medicine

## 2023-12-09 ENCOUNTER — Ambulatory Visit (INDEPENDENT_AMBULATORY_CARE_PROVIDER_SITE_OTHER): Admitting: Family Medicine

## 2023-12-09 VITALS — BP 100/67 | HR 104 | Temp 98.1°F | Ht 62.0 in | Wt 289.0 lb

## 2023-12-09 DIAGNOSIS — R7303 Prediabetes: Secondary | ICD-10-CM

## 2023-12-09 DIAGNOSIS — Z6841 Body Mass Index (BMI) 40.0 and over, adult: Secondary | ICD-10-CM | POA: Diagnosis not present

## 2023-12-09 DIAGNOSIS — E559 Vitamin D deficiency, unspecified: Secondary | ICD-10-CM

## 2023-12-09 MED ORDER — METFORMIN HCL 500 MG PO TABS: 500.0000 mg | ORAL_TABLET | Freq: Two times a day (BID) | ORAL | 0 refills | Status: DC

## 2023-12-09 MED ORDER — VITAMIN D (ERGOCALCIFEROL) 1.25 MG (50000 UNIT) PO CAPS
50000.0000 [IU] | ORAL_CAPSULE | ORAL | 0 refills | Status: DC
Start: 2023-12-09 — End: 2024-02-12

## 2023-12-09 NOTE — Progress Notes (Unsigned)
 SUBJECTIVE:  Chief Complaint: Obesity  Interim History: Since last appointment patient went on vacation and mentions that upon returning from her trip she has felt significantly ill.  She voices she hasn't had many accompanying symptoms.  She is taking it slowly in terms of intake and activity.  Her calorie average last week was 1627 and prior to that 1895.  Protein average from last week was 95 and 90.  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  She is trying to get back to planning and prepping at the beginning of the week in preparation to going back to work.  Patient is planning to attend a family reunion at the end of this month.  [name] is here to discuss her progress with her obesity treatment plan. She is on the keeping a food journal and adhering to recommended goals of 1700-1800 calories and 100 grams of protein and states she is following her eating plan approximately 65 % of the time. She states she is not exercising.   OBJECTIVE: Visit Diagnoses: Problem List Items Addressed This Visit       Other   Vitamin D  deficiency   Relevant Medications   Vitamin D , Ergocalciferol , (DRISDOL ) 1.25 MG (50000 UNIT) CAPS capsule   Prediabetes - Primary   Relevant Medications   metFORMIN  (GLUCOPHAGE ) 500 MG tablet   Morbid obesity (HCC)   Relevant Medications   metFORMIN  (GLUCOPHAGE ) 500 MG tablet   Other Visit Diagnoses       BMI 50.0-59.9, adult (HCC)       Relevant Medications   metFORMIN  (GLUCOPHAGE ) 500 MG tablet       Vitals Temp: 98.1 F (36.7 C) BP: 100/67 Pulse Rate: (!) 104 SpO2: 95 %   Anthropometric Measurements Height: 5' 2 (1.575 m) Weight: 289 lb (131.1 kg) BMI (Calculated): 52.85 Weight at Last Visit: 292 lb Weight Lost Since Last Visit: 3 Weight Gained Since Last Visit: 0 Starting Weight: 293 lb Total Weight Loss (lbs): 4 lb (1.814 kg)   Body Composition  Body Fat %: 55.2 % Fat Mass  (lbs): 159.6 lbs Muscle Mass (lbs): 123 lbs Visceral Fat Rating : 20   Other Clinical Data Today's Visit #: 75 Starting Date: 10/02/17 Comments: [id number]     ASSESSMENT AND PLAN: Assessment & Plan Pre-diabetes Recent A1c still in prediabetic range- patient working on limiting simple carbohydrates and increasing total protein intake. Needs refill of her metformin  today.  No GI side effects and no change in treatment/ doses of medication at this time. Vitamin D  deficiency On vitamin d  supplementation previously.  Last vitamin D  level slightly below goal- refill Vitamin D  level today. Morbid obesity (HCC)  BMI 50.0-59.9, adult (HCC)    Diet: Shawnetta is currently in the action stage of change. As such, her goal is to continue with weight loss efforts and has agreed to keeping a food journal and adhering to recommended goals of 1300-1400 calories and 90 protein.   Exercise:  For substantial health benefits, adults should do at least 150 minutes (2 hours and 30 minutes) a week of moderate-intensity, or 75 minutes (1 hour and 15 minutes) a week of vigorous-intensity aerobic physical activity, or an equivalent combination of moderate- and vigorous-intensity aerobic activity. Aerobic activity should be performed in episodes of at least 10 minutes, and preferably, it should be spread throughout the week.  Patient to get walking pad out this week to do  10-15 minutes 3x a week.  Behavior Modification:  We discussed the following Behavioral Modification Strategies today: increasing lean protein intake, decreasing simple carbohydrates, increasing vegetables, meal planning and cooking strategies, and keep a strict food journal.   Return in about 5 weeks (around 01/13/2024).   She was informed of the importance of frequent follow up visits to maximize her success with intensive lifestyle modifications for her multiple health conditions.  Attestation Statements:   Reviewed by clinician on  day of visit: allergies, medications, problem list, medical history, surgical history, family history, social history, and previous encounter notes.     Adelita Cho, MD

## 2023-12-10 NOTE — Assessment & Plan Note (Signed)
 On vitamin d  supplementation previously.  Last vitamin D  level slightly below goal- refill Vitamin D  level today.

## 2023-12-10 NOTE — Assessment & Plan Note (Signed)
 Recent A1c still in prediabetic range- patient working on limiting simple carbohydrates and increasing total protein intake. Needs refill of her metformin  today.  No GI side effects and no change in treatment/ doses of medication at this time.

## 2023-12-18 ENCOUNTER — Other Ambulatory Visit (INDEPENDENT_AMBULATORY_CARE_PROVIDER_SITE_OTHER)

## 2023-12-18 DIAGNOSIS — Z13220 Encounter for screening for lipoid disorders: Secondary | ICD-10-CM

## 2023-12-18 DIAGNOSIS — R7303 Prediabetes: Secondary | ICD-10-CM

## 2023-12-18 DIAGNOSIS — Z79899 Other long term (current) drug therapy: Secondary | ICD-10-CM | POA: Diagnosis not present

## 2023-12-18 DIAGNOSIS — Z6841 Body Mass Index (BMI) 40.0 and over, adult: Secondary | ICD-10-CM | POA: Diagnosis not present

## 2023-12-18 DIAGNOSIS — K5090 Crohn's disease, unspecified, without complications: Secondary | ICD-10-CM | POA: Diagnosis not present

## 2023-12-18 DIAGNOSIS — Z0000 Encounter for general adult medical examination without abnormal findings: Secondary | ICD-10-CM

## 2023-12-18 LAB — BASIC METABOLIC PANEL WITH GFR
BUN: 14 mg/dL (ref 6–23)
CO2: 28 meq/L (ref 19–32)
Calcium: 8.6 mg/dL (ref 8.4–10.5)
Chloride: 104 meq/L (ref 96–112)
Creatinine, Ser: 0.65 mg/dL (ref 0.40–1.20)
GFR: 109.92 mL/min (ref >60.00)
Glucose, Bld: 83 mg/dL (ref 70–99)
Potassium: 4.4 meq/L (ref 3.5–5.1)
Sodium: 139 meq/L (ref 135–145)

## 2023-12-18 LAB — CBC WITH DIFFERENTIAL/PLATELET
Basophils Absolute: 0.1 K/uL (ref 0.0–0.1)
Basophils Relative: 0.7 % (ref 0.0–3.0)
Eosinophils Absolute: 0.1 K/uL (ref 0.0–0.7)
Eosinophils Relative: 0.7 % (ref 0.0–5.0)
HCT: 39.8 % (ref 36.0–46.0)
Hemoglobin: 13.1 g/dL (ref 12.0–15.0)
Lymphocytes Relative: 28 % (ref 12.0–46.0)
Lymphs Abs: 3 K/uL (ref 0.7–4.0)
MCHC: 32.8 g/dL (ref 30.0–36.0)
MCV: 90.2 fl (ref 78.0–100.0)
Monocytes Absolute: 1 K/uL (ref 0.1–1.0)
Monocytes Relative: 9.2 % (ref 3.0–12.0)
Neutro Abs: 6.6 K/uL (ref 1.4–7.7)
Neutrophils Relative %: 61.4 % (ref 43.0–77.0)
Platelets: 357 K/uL (ref 150.0–400.0)
RBC: 4.41 Mil/uL (ref 3.87–5.11)
RDW: 15.7 % — ABNORMAL HIGH (ref 11.5–15.5)
WBC: 10.7 K/uL — ABNORMAL HIGH (ref 4.0–10.5)

## 2023-12-18 LAB — HEPATIC FUNCTION PANEL
ALT: 11 U/L (ref 0–35)
AST: 12 U/L (ref 0–37)
Albumin: 3.4 g/dL — ABNORMAL LOW (ref 3.5–5.2)
Alkaline Phosphatase: 59 U/L (ref 39–117)
Bilirubin, Direct: 0.1 mg/dL (ref 0.0–0.3)
Total Bilirubin: 0.5 mg/dL (ref 0.2–1.2)
Total Protein: 6.5 g/dL (ref 6.0–8.3)

## 2023-12-18 LAB — LIPID PANEL
Cholesterol: 165 mg/dL (ref 0–200)
HDL: 52.3 mg/dL (ref >39.00)
LDL Cholesterol: 104 mg/dL — ABNORMAL HIGH (ref 0–99)
NonHDL: 113.01
Total CHOL/HDL Ratio: 3
Triglycerides: 45 mg/dL (ref 0.0–149.0)
VLDL: 9 mg/dL (ref 0.0–40.0)

## 2023-12-18 LAB — MICROALBUMIN / CREATININE URINE RATIO
Creatinine,U: 99.5 mg/dL
Microalb Creat Ratio: UNDETERMINED mg/g (ref 0.0–30.0)
Microalb, Ur: <0.7 mg/dL

## 2023-12-18 LAB — VITAMIN B12: Vitamin B-12: 1015 pg/mL — ABNORMAL HIGH (ref 211–911)

## 2023-12-18 LAB — TSH: TSH: 1.25 u[IU]/mL (ref 0.35–5.50)

## 2023-12-18 LAB — HEMOGLOBIN A1C: Hgb A1c MFr Bld: 6.2 % (ref 4.6–6.5)

## 2023-12-20 ENCOUNTER — Ambulatory Visit

## 2023-12-20 VITALS — BP 128/78 | HR 80 | Temp 97.7°F | Resp 20 | Ht 62.5 in | Wt 289.6 lb

## 2023-12-20 DIAGNOSIS — K5000 Crohn's disease of small intestine without complications: Secondary | ICD-10-CM

## 2023-12-20 DIAGNOSIS — Z8719 Personal history of other diseases of the digestive system: Secondary | ICD-10-CM

## 2023-12-20 MED ORDER — DIPHENHYDRAMINE HCL 25 MG PO CAPS: 25.0000 mg | ORAL_CAPSULE | Freq: Once | ORAL | Status: DC

## 2023-12-20 MED ORDER — SODIUM CHLORIDE 0.9 % IV SOLN
5.0000 mg/kg | Freq: Once | INTRAVENOUS | Status: AC
  Administered 2023-12-20: 700 mg via INTRAVENOUS
  Filled 2023-12-20: qty 70

## 2023-12-20 MED ORDER — METHYLPREDNISOLONE SODIUM SUCC 40 MG IJ SOLR
40.0000 mg | Freq: Once | INTRAMUSCULAR | Status: DC
Start: 2023-12-20 — End: 2023-12-20

## 2023-12-20 MED ORDER — ACETAMINOPHEN 325 MG PO TABS: 650.0000 mg | ORAL_TABLET | Freq: Once | ORAL | Status: DC

## 2023-12-20 NOTE — Progress Notes (Signed)
 Diagnosis: Crohn's Disease  Provider:  Praveen Mannam MD  Procedure: IV Infusion  IV Type: Peripheral, IV Location: R Forearm  Avsola  (infliximab -axxq), Dose: 700mg   Infusion Start Time: 0925  Infusion Stop Time: 1135  Post Infusion IV Care: Peripheral IV Discontinued  Discharge: Condition: Good, Destination: Home . AVS Declined  Performed by:  Donny Childes, RN

## 2023-12-24 NOTE — Progress Notes (Unsigned)
 No chief complaint on file.   HPI: Patient  [name]  43 y.o. comes in today for Preventive Health Care visit   Gi Weight management   Health Maintenance  Topic Date Due   Hepatitis B Vaccines (1 of 3 - 19+ 3-dose series) Never done   HPV VACCINES (1 - 3-dose SCDM series) Never done   Pneumococcal Vaccine 58-70 Years old (3 of 3 - PCV) 08/25/2020   Cervical Cancer Screening (HPV/Pap Cotest)  02/24/2021   COVID-19 Vaccine (5 - 2024-25 season) 01/27/2023   INFLUENZA VACCINE  12/27/2023   DTaP/Tdap/Td (4 - Td or Tdap) 10/13/2030   Hepatitis C Screening  Completed   HIV Screening  Completed   Meningococcal B Vaccine  Aged Out   Health Maintenance Review LIFESTYLE:  Exercise:   Tobacco/ETS: Alcohol:  Sugar beverages: Sleep: Drug use: no HH of  Work:    ROS:  GEN/ HEENT: No fever, significant weight changes sweats headaches vision problems hearing changes, CV/ PULM; No chest pain shortness of breath cough, syncope,edema  change in exercise tolerance. GI /GU: No adominal pain, vomiting, change in bowel habits. No blood in the stool. No significant GU symptoms. SKIN/HEME: ,no acute skin rashes suspicious lesions or bleeding. No lymphadenopathy, nodules, masses.  NEURO/ PSYCH:  No neurologic signs such as weakness numbness. No depression anxiety. IMM/ Allergy: No unusual infections.  Allergy .   REST of 12 system review negative except as per HPI   Past Medical History:  Diagnosis Date   Abrasion of upper arm with infection, left, sequela    started antibiotics lef arm oct 4th started antibiotics healing well   Anemia    hx of 2 yrs ago   Blood transfusion without reported diagnosis    Chicken pox    Chronic rhinitis    Contact dermatitis and other eczema, due to unspecified cause    Crohn's 11/15/2011   no issues in 2 yrs   Fibroids 11/15/2011   remains with fibroids   Hernia    Incisional hernia, RLQ 10/02/2011   Infertility, female    Mononucleosis     Obesity    Pilonidal abscess    Rectal abscess    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HCC)    Vitamin D  deficiency     Past Surgical History:  Procedure Laterality Date   APPENDECTOMY  11/2004   BIOPSY  01/25/2022   Procedure: BIOPSY;  Surgeon: Charlanne Groom, MD;  Location: [facility];  Service: Gastroenterology;;   BIOPSY  01/26/2022   Procedure: BIOPSY;  Surgeon: Charlanne Groom, MD;  Location: [facility];  Service: Gastroenterology;;   BIOPSY  01/24/2023   Procedure: BIOPSY;  Surgeon: Wilhelmenia Aloha Raddle., MD;  Location: [facility];  Service: Gastroenterology;;   CESAREAN SECTION N/A 11/30/2020   Procedure: CESAREAN SECTION;  Surgeon: Henry Slough, MD;  Location: [facility];  Service: Obstetrics;  Laterality: N/A;   COLONOSCOPY     COLONOSCOPY WITH PROPOFOL  N/A 03/07/2017   Procedure: COLONOSCOPY WITH PROPOFOL ;  Surgeon: Teressa Toribio SQUIBB, MD;  Location: [facility];  Service: Endoscopy;  Laterality: N/A;   COLONOSCOPY WITH PROPOFOL  N/A 01/26/2022   Procedure: COLONOSCOPY WITH PROPOFOL ;  Surgeon: Charlanne Groom, MD;  Location: [facility];  Service: Gastroenterology;  Laterality: N/A;   COLONOSCOPY WITH PROPOFOL  N/A 01/24/2023   Procedure: COLONOSCOPY WITH PROPOFOL ;  Surgeon: Mansouraty, Aloha Raddle., MD;  Location: [facility];  Service: Gastroenterology;  Laterality: N/A;   ESOPHAGOGASTRODUODENOSCOPY (EGD) WITH PROPOFOL  N/A 01/25/2022  Procedure: ESOPHAGOGASTRODUODENOSCOPY (EGD) WITH PROPOFOL ;  Surgeon: Charlanne Groom, MD;  Location: [facility];  Service: Gastroenterology;  Laterality: N/A;   EYE SURGERY  11/15/2011   2003-multiple stys   ileocecal resection  05/2009   crohns disease with fisula/stricture. Dr. Jeoffrey Dawn   INCISIONAL HERNIA REPAIR N/A 12/24/2018   Procedure: OPEN REPAIR VENTRAL INCISIONAL HERNIA WITH MESH PATCH AND PRIMARY REPAIR OF LEFT SPIGELIAN HERNIA;  Surgeon: Eletha Boas, MD;  Location: [facility];  Service: General;  Laterality: N/A;   KNEE  ARTHROSCOPY  99, 02   Bil.   MYOMECTOMY N/A 11/10/2014   Procedure: MYOMECTOMY;  Surgeon: Jon Rummer, MD;  Location: [facility];  Service: Gynecology;  Laterality: N/A;   PILONIDAL CYST EXCISION  2011, 2012   I&Ds   UPPER GASTROINTESTINAL ENDOSCOPY     VENTRAL HERNIA REPAIR  11/27/2011   Procedure: LAPAROSCOPIC VENTRAL HERNIA;  Surgeon: Elspeth KYM Schultze, MD;  Location: [facility];  Service: General;  Laterality: N/A;  Laparoscopic Exploration & Repair of Hernia in Abdomen   WISDOM TOOTH EXTRACTION      Family History  Problem Relation Age of Onset   Aneurysm Mother        brain   Heart disease Mother 43       heart anyrism   Healthy Father        fairly   Diabetes Sister        half sister   Bipolar disorder Sister        half sister   Multiple sclerosis Sister    Stomach cancer Paternal Grandfather    Esophageal cancer Neg Hx    Inflammatory bowel disease Neg Hx    Liver disease Neg Hx    Pancreatic cancer Neg Hx    Rectal cancer Neg Hx    Colon cancer Neg Hx     Social History   Socioeconomic History   Marital status: Married    Spouse name: Azaylah Stailey   Number of children: Not on file   Years of education: Not on file   Highest education level: Bachelor's degree (e.g., BA, AB, BS)  Occupational History   Occupation: Editor, commissioning  Tobacco Use   Smoking status: Never   Smokeless tobacco: Never  Vaping Use   Vaping status: Never Used  Substance and Sexual Activity   Alcohol use: No   Drug use: No   Sexual activity: Yes    Partners: Male  Other Topics Concern   Not on file  Social History Narrative   married   HH of 2   Occupation: Editor, 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    No pets   Sleep 6-7 hours    BF with 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   Walking exercise   Eats balanced generally   Social Drivers of Health   Financial Resource Strain: Low Risk  (09/11/2022)   Overall Financial Resource Strain (CARDIA)    Difficulty of Paying Living Expenses: Not  hard at all  Food Insecurity: No Food Insecurity (09/11/2022)   Hunger Vital Sign    Worried About Running Out of Food in the Last Year: Never true    Ran Out of Food in the Last Year: Never true  Transportation Needs: No Transportation Needs (09/11/2022)   PRAPARE - Administrator, Civil Service (Medical): No    Lack of Transportation (Non-Medical): No  Physical Activity: Insufficiently Active (12/19/2022)   Exercise Vital Sign    Days of Exercise per  Week: 1 day    Minutes of Exercise per Session: 20 min  Stress: No Stress Concern Present (09/11/2022)   [employer] of Occupational Health - Occupational Stress Questionnaire    Feeling of Stress : Only a little  Social Connections: Socially Integrated (12/19/2022)   Social Connection and Isolation Panel    Frequency of Communication with Friends and Family: More than three times a week    Frequency of Social Gatherings with Friends and Family: Not on file    Attends Religious Services: More than 4 times per year    Active Member of Clubs or Organizations: Yes    Attends Engineer, structural: More than 4 times per year    Marital Status: Married    Outpatient Medications Prior to Visit  Medication Sig Dispense Refill   Calcium  Carb-Cholecalciferol  (CALCIUM /VITAMIN D ) 600-400 MG-UNIT TABS Take 1 tablet by mouth at bedtime.     ferrous gluconate  (FERGON) 324 MG tablet Take 2 tablets (648 mg total) by mouth daily with breakfast. 180 tablet 1   fexofenadine (ALLEGRA) 180 MG tablet Take 180 mg by mouth at bedtime.     inFLIXimab -axxq (AVSOLA ) 100 MG injection Inject 1 mg into the vein. Every month     metFORMIN  (GLUCOPHAGE ) 500 MG tablet Take 1 tablet (500 mg total) by mouth 2 (two) times daily with a meal. 1 full tab daily with breakfast and 1 at lunch 180 tablet 0   Vitamin D , Ergocalciferol , (DRISDOL ) 1.25 MG (50000 UNIT) CAPS capsule Take 1 capsule (50,000 Units total) by mouth every 14 (fourteen) days. 8 capsule 0    Facility-Administered Medications Prior to Visit  Medication Dose Route Frequency Provider Last Rate Last Admin   acetaminophen  (TYLENOL ) tablet 650 mg  650 mg Oral Once Mansouraty, Aloha Raddle., MD         EXAM:  There were no vitals taken for this visit.  There is no height or weight on file to calculate BMI. Wt Readings from Last 3 Encounters:  12/20/23 289 lb 9.6 oz (131.4 kg)  12/09/23 289 lb (131.1 kg)  11/26/23 (!) 304 lb (137.9 kg)    Physical Exam: Vital signs reviewed HZW:Uypd is a well-developed well-nourished alert cooperative    who appearsr stated age in no acute distress.  HEENT: normocephalic atraumatic , Eyes: PERRL EOM's full, conjunctiva clear, Nares: paten,t no deformity discharge or tenderness., Ears: no deformity EAC's clear TMs with normal landmarks. Mouth: clear OP, no lesions, edema.  Moist mucous membranes. Dentition in adequate repair. NECK: supple without masses, thyromegaly or bruits. CHEST/PULM:  Clear to auscultation and percussion breath sounds equal no wheeze , rales or rhonchi. No chest wall deformities or tenderness. Breast: normal by inspection . No dimpling, discharge, masses, tenderness or discharge . CV: PMI is nondisplaced, S1 S2 no gallops, murmurs, rubs. Peripheral pulses are full without delay.No JVD .  ABDOMEN: Bowel sounds normal nontender  No guard or rebound, no hepato splenomegal no CVA tenderness.  No hernia. Extremtities:  No clubbing cyanosis or edema, no acute joint swelling or redness no focal atrophy NEURO:  Oriented x3, cranial nerves 3-12 appear to be intact, no obvious focal weakness,gait within normal limits no abnormal reflexes or asymmetrical SKIN: No acute rashes normal turgor, color, no bruising or petechiae. PSYCH: Oriented, good eye contact, no obvious depression anxiety, cognition and judgment appear normal. LN: no cervical axillary i adenopathy  Lab Results  Component Value Date   WBC 10.7 (H) 12/18/2023   HGB  13.1  12/18/2023   HCT 39.8 12/18/2023   PLT 357.0 12/18/2023   GLUCOSE 83 12/18/2023   CHOL 165 12/18/2023   TRIG 45.0 12/18/2023   HDL 52.30 12/18/2023   LDLCALC 104 (H) 12/18/2023   ALT 11 12/18/2023   AST 12 12/18/2023   NA 139 12/18/2023   K 4.4 12/18/2023   CL 104 12/18/2023   CREATININE 0.65 12/18/2023   BUN 14 12/18/2023   CO2 28 12/18/2023   TSH 1.25 12/18/2023   INR 1.47 05/26/2009   HGBA1C 6.2 12/18/2023   MICROALBUR <0.7 12/18/2023    BP Readings from Last 3 Encounters:  12/20/23 128/78  12/09/23 100/67  11/26/23 130/73    Lab results reviewed with patient   ASSESSMENT AND PLAN:  Discussed the following assessment and plan:  No diagnosis found. No follow-ups on file.  Patient Care Team: Amrutha Avera, Apolinar POUR, MD as PCP - General (Internal Medicine) Teressa Toribio SQUIBB, MD (Inactive) (Gastroenterology) Henry Slough, MD (Obstetrics and Gynecology) Mai Lynwood FALCON, MD (Rheumatology) Charlanne Groom, MD as Consulting Physician (Gastroenterology) Loretha Ash, [name] Loretha Ash, MD as Consulting Physician (Hematology and Oncology) There are no Patient Instructions on file for this visit.  Colden Samaras K. Shaylon Aden M.D.

## 2023-12-25 ENCOUNTER — Ambulatory Visit (INDEPENDENT_AMBULATORY_CARE_PROVIDER_SITE_OTHER): Admitting: Internal Medicine

## 2023-12-25 VITALS — BP 110/66 | HR 91 | Temp 98.1°F | Ht 62.5 in | Wt 289.0 lb

## 2023-12-25 DIAGNOSIS — K5090 Crohn's disease, unspecified, without complications: Secondary | ICD-10-CM

## 2023-12-25 DIAGNOSIS — Z0000 Encounter for general adult medical examination without abnormal findings: Secondary | ICD-10-CM

## 2023-12-25 DIAGNOSIS — Z6841 Body Mass Index (BMI) 40.0 and over, adult: Secondary | ICD-10-CM | POA: Diagnosis not present

## 2023-12-25 NOTE — Patient Instructions (Signed)
 Good to see you today  Work on screen limitation to help more sleep hours. In addition to lsi . Consider stop of decrease to reg vitamins  Fu B12 level with  weight management ?   Uncertain if related to illness and meds .  No anemia.   Prevnar 20 sometimes in future.

## 2024-01-07 ENCOUNTER — Ambulatory Visit (INDEPENDENT_AMBULATORY_CARE_PROVIDER_SITE_OTHER): Admitting: Family Medicine

## 2024-01-08 ENCOUNTER — Encounter (INDEPENDENT_AMBULATORY_CARE_PROVIDER_SITE_OTHER): Payer: Self-pay | Admitting: Family Medicine

## 2024-01-08 ENCOUNTER — Ambulatory Visit (INDEPENDENT_AMBULATORY_CARE_PROVIDER_SITE_OTHER): Admitting: Family Medicine

## 2024-01-08 VITALS — BP 93/61 | HR 79 | Temp 97.7°F | Ht 62.0 in | Wt 283.0 lb

## 2024-01-08 DIAGNOSIS — R7303 Prediabetes: Secondary | ICD-10-CM

## 2024-01-08 DIAGNOSIS — E559 Vitamin D deficiency, unspecified: Secondary | ICD-10-CM

## 2024-01-08 DIAGNOSIS — Z6841 Body Mass Index (BMI) 40.0 and over, adult: Secondary | ICD-10-CM

## 2024-01-08 NOTE — Progress Notes (Unsigned)
 SUBJECTIVE:  Chief Complaint: Obesity  Interim History: Patient had a great end of July.  She had a family reunion and took her daughter to see patient's sister.  She is prepping for the beginning of school and goes back to work Monday and kids go back a week later. She has been tracking and sticking between 1500-1600 calories and 123 grams, 135, 117 and 121 grams of protein over the last few weeks. Not anticipating any challenges to staying consistent in food intake over the next few weeks.   [name] is here to discuss her progress with her obesity treatment plan. She is on the keeping a food journal and adhering to recommended goals of 1300-1400 calories and 90 grams of protein and states she is following her eating plan approximately 98 % of the time. She states she is exercising 10 minutes 2 times per week- she is [employer].    OBJECTIVE: Visit Diagnoses: Problem List Items Addressed This Visit       Other   Vitamin D  deficiency   Prediabetes - Primary   Morbid obesity (HCC)   Other Visit Diagnoses       BMI 50.0-59.9, adult (HCC)           Vitals Temp: 97.7 F (36.5 C) BP: 93/61 Pulse Rate: 79 SpO2: 96 %   Anthropometric Measurements Height: 5' 2 (1.575 m) Weight: 283 lb (128.4 kg) BMI (Calculated): 51.75 Weight at Last Visit: 289 lb Weight Lost Since Last Visit: 6 Weight Gained Since Last Visit: 0 Starting Weight: 293 lb Total Weight Loss (lbs): 10 lb (4.536 kg)   Body Composition  Body Fat %: 52 % Fat Mass (lbs): 147.4 lbs Muscle Mass (lbs): 129.4 lbs Visceral Fat Rating : 18   Other Clinical Data Today's Visit #: 20 Starting Date: 10/02/17 Comments: 1700-1800/90     ASSESSMENT AND PLAN: Assessment & Plan Pre-diabetes Recent A1c increased from 5.8-6.2.  Discussed labs from July 23 with patient today and the importance of limiting simple carbohydrates with a focus of increasing her total protein intake.  She agreed to  continue monitoring her for dietary intake with food logging to stay with the calorie and protein goals listed below Vitamin D  deficiency Recent vitamin D  level close to goal.  Continue current vitamin D  supplementation. Morbid obesity (HCC)  BMI 50.0-59.9, adult (HCC)    Diet: [name] is currently in the action stage of change. As such, her goal is to continue with weight loss efforts and has agreed to keeping a food journal and adhering to recommended goals of 1400-1500 calories and 90 or more grams of protein.   Exercise:  For substantial health benefits, adults should do at least 150 minutes (2 hours and 30 minutes) a week of moderate-intensity, or 75 minutes (1 hour and 15 minutes) a week of vigorous-intensity aerobic physical activity, or an equivalent combination of moderate- and vigorous-intensity aerobic activity. Aerobic activity should be performed in episodes of at least 10 minutes, and preferably, it should be spread throughout the week.  Behavior Modification:  We discussed the following Behavioral Modification Strategies today: increasing lean protein intake, decreasing simple carbohydrates, increasing vegetables, meal planning and cooking strategies, planning for success, and keep a strict food journal.   Return in about 5 weeks (around 02/12/2024).   She was informed of the importance of frequent follow up visits to maximize her success with intensive lifestyle modifications for her multiple health conditions.  Attestation Statements:   Reviewed  by clinician on day of visit: allergies, medications, problem list, medical history, surgical history, family history, social history, and previous encounter notes.     Adelita Cho, MD

## 2024-01-15 NOTE — Assessment & Plan Note (Signed)
 Recent vitamin D  level close to goal.  Continue current vitamin D  supplementation.

## 2024-01-15 NOTE — Assessment & Plan Note (Signed)
 Recent A1c increased from 5.8-6.2.  Discussed labs from July 23 with patient today and the importance of limiting simple carbohydrates with a focus of increasing her total protein intake.  She agreed to continue monitoring her for dietary intake with food logging to stay with the calorie and protein goals listed below

## 2024-01-17 ENCOUNTER — Ambulatory Visit

## 2024-01-23 ENCOUNTER — Ambulatory Visit (INDEPENDENT_AMBULATORY_CARE_PROVIDER_SITE_OTHER)

## 2024-01-23 VITALS — BP 112/73 | HR 85 | Temp 98.1°F | Resp 22 | Ht 62.0 in | Wt 284.2 lb

## 2024-01-23 DIAGNOSIS — K5000 Crohn's disease of small intestine without complications: Secondary | ICD-10-CM | POA: Diagnosis not present

## 2024-01-23 DIAGNOSIS — Z8719 Personal history of other diseases of the digestive system: Secondary | ICD-10-CM | POA: Diagnosis not present

## 2024-01-23 MED ORDER — DIPHENHYDRAMINE HCL 25 MG PO CAPS: 25.0000 mg | ORAL_CAPSULE | Freq: Once | ORAL | Status: DC

## 2024-01-23 MED ORDER — ACETAMINOPHEN 325 MG PO TABS
650.0000 mg | ORAL_TABLET | Freq: Once | ORAL | Status: DC
Start: 2024-01-23 — End: 2024-01-23

## 2024-01-23 MED ORDER — METHYLPREDNISOLONE SODIUM SUCC 40 MG IJ SOLR: 40.0000 mg | Freq: Once | INTRAMUSCULAR | Status: DC

## 2024-01-23 MED ORDER — SODIUM CHLORIDE 0.9 % IV SOLN
5.0000 mg/kg | Freq: Once | INTRAVENOUS | Status: AC
  Administered 2024-01-23: 600 mg via INTRAVENOUS
  Filled 2024-01-23: qty 60

## 2024-01-23 NOTE — Progress Notes (Signed)
 Diagnosis: Crohn's Disease  Provider:  Praveen Mannam MD  Procedure: IV Infusion  IV Type: Peripheral, IV Location: R Antecubital  Avsola  (infliximab -axxq), Dose: 600 mg  Infusion Start Time: 1445  Infusion Stop Time: 1659  Post Infusion IV Care: Peripheral IV Discontinued  Discharge: Condition: Good, Destination: Home . AVS Declined  Performed by:  Eleanor DELENA Bloch, RN

## 2024-02-12 ENCOUNTER — Ambulatory Visit (INDEPENDENT_AMBULATORY_CARE_PROVIDER_SITE_OTHER): Admitting: Family Medicine

## 2024-02-12 ENCOUNTER — Encounter (INDEPENDENT_AMBULATORY_CARE_PROVIDER_SITE_OTHER): Payer: Self-pay | Admitting: Family Medicine

## 2024-02-12 VITALS — BP 101/67 | HR 80 | Temp 97.7°F | Ht 62.0 in | Wt 278.0 lb

## 2024-02-12 DIAGNOSIS — Z6841 Body Mass Index (BMI) 40.0 and over, adult: Secondary | ICD-10-CM | POA: Diagnosis not present

## 2024-02-12 DIAGNOSIS — R7303 Prediabetes: Secondary | ICD-10-CM

## 2024-02-12 DIAGNOSIS — E559 Vitamin D deficiency, unspecified: Secondary | ICD-10-CM | POA: Diagnosis not present

## 2024-02-12 MED ORDER — VITAMIN D (ERGOCALCIFEROL) 1.25 MG (50000 UNIT) PO CAPS
50000.0000 [IU] | ORAL_CAPSULE | ORAL | 0 refills | Status: DC
Start: 2024-02-12 — End: 2024-03-18

## 2024-02-12 MED ORDER — METFORMIN HCL 500 MG PO TABS: 500.0000 mg | ORAL_TABLET | Freq: Every day | ORAL | Status: DC

## 2024-02-12 NOTE — Progress Notes (Signed)
 SUBJECTIVE:  Chief Complaint: Obesity  Interim History: Patient has been consistently journaling since our last appointment.  She has been between 1400-1700 calories daily.  Protein intake 132, 126, 129, 122 and 131.  She voices that she has been very consistent with her intake.  One day she did overindulge in calories and another day she didn't meal prep but she did stay mindful of intake. Over the next 6 weeks patient voices that she needs to start exercising and wants to find better choices for meat- making substitution for lightly breaded chicken at breakfast. She enjoys dancing and walking.  Patient is a home workout person.   [name] is here to discuss her progress with her obesity treatment plan. She is on the keeping a food journal and adhering to recommended goals of 1400-1500 calories and 90 grams of protein and states she is following her eating plan approximately 85 % of the time. She states she is not exercising.  OBJECTIVE: Visit Diagnoses: Problem List Items Addressed This Visit       Other   Vitamin D  deficiency   Relevant Medications   Vitamin D , Ergocalciferol , (DRISDOL ) 1.25 MG (50000 UNIT) CAPS capsule   Prediabetes - Primary   Patient reports she has not been consistent with her twice a day dosing of metformin .  She is not experiencing any hunger.  No refill needed but med list updated.      Relevant Medications   metFORMIN  (GLUCOPHAGE ) 500 MG tablet   Morbid obesity (HCC)   Relevant Medications   metFORMIN  (GLUCOPHAGE ) 500 MG tablet   Other Visit Diagnoses       BMI 50.0-59.9, adult (HCC)       Relevant Medications   metFORMIN  (GLUCOPHAGE ) 500 MG tablet       Vitals Temp: 97.7 F (36.5 C) BP: 101/67 Pulse Rate: 80 SpO2: 98 %   Anthropometric Measurements Height: 5' 2 (1.575 m) Weight: 278 lb (126.1 kg) BMI (Calculated): 50.83 Weight at Last Visit: 283 lb Weight Lost Since Last Visit: 5 Weight Gained Since Last Visit: 0 Starting Weight: 293  lb Total Weight Loss (lbs): 15 lb (6.804 kg)   Body Composition  Body Fat %: 51.6 % Fat Mass (lbs): 143.8 lbs Muscle Mass (lbs): 128.2 lbs Visceral Fat Rating : 18   Other Clinical Data Fasting: yes Labs: no Today's Visit #: 79 Starting Date: 10/02/17 Comments: 1400/1500/90     ASSESSMENT AND PLAN: Assessment & Plan Vitamin D  deficiency On prescription strength Vitamin D   with no nausea, vomiting or muscle weakness.  Needs a refill of prescription strength Vitamin D  today. Pre-diabetes Patient reports she has not been consistent with her twice a day dosing of metformin .  She is not experiencing any hunger.  No refill needed but med list updated. Morbid obesity (HCC)  BMI 50.0-59.9, adult (HCC)    Diet: [name] is currently in the action stage of change. As such, her goal is to continue with weight loss efforts and has agreed to keeping a food journal and adhering to recommended goals of 1700-1800 calories and 125 or more grams of protein.   Exercise:  For substantial health benefits, adults should do at least 150 minutes (2 hours and 30 minutes) a week of moderate-intensity, or 75 minutes (1 hour and 15 minutes) a week of vigorous-intensity aerobic physical activity, or an equivalent combination of moderate- and vigorous-intensity aerobic activity. Aerobic activity should be performed in episodes of at least 10 minutes, and preferably, it should be spread  throughout the week.  Behavior Modification:  We discussed the following Behavioral Modification Strategies today: increasing lean protein intake, decreasing simple carbohydrates, increasing vegetables, meal planning and cooking strategies, keeping healthy foods in the home, and keep a strict food journal.   Return in about 6 weeks (around 03/25/2024).   She was informed of the importance of frequent follow up visits to maximize her success with intensive lifestyle modifications for her multiple health  conditions.  Attestation Statements:   Reviewed by clinician on day of visit: allergies, medications, problem list, medical history, surgical history, family history, social history, and previous encounter notes.    Adelita Cho, MD

## 2024-02-12 NOTE — Assessment & Plan Note (Addendum)
 Patient reports she has not been consistent with her twice a day dosing of metformin .  She is not experiencing any hunger.  No refill needed but med list updated.

## 2024-02-19 NOTE — Assessment & Plan Note (Signed)
 On prescription strength Vitamin D   with no nausea, vomiting or muscle weakness.  Needs a refill of prescription strength Vitamin D  today.

## 2024-02-20 ENCOUNTER — Ambulatory Visit (INDEPENDENT_AMBULATORY_CARE_PROVIDER_SITE_OTHER)

## 2024-02-20 VITALS — BP 112/71 | HR 70 | Temp 97.3°F | Resp 20 | Ht 62.5 in | Wt 282.0 lb

## 2024-02-20 DIAGNOSIS — K5000 Crohn's disease of small intestine without complications: Secondary | ICD-10-CM

## 2024-02-20 DIAGNOSIS — Z8719 Personal history of other diseases of the digestive system: Secondary | ICD-10-CM | POA: Diagnosis not present

## 2024-02-20 MED ORDER — DIPHENHYDRAMINE HCL 25 MG PO CAPS: 25.0000 mg | ORAL_CAPSULE | Freq: Once | ORAL | Status: DC

## 2024-02-20 MED ORDER — METHYLPREDNISOLONE SODIUM SUCC 40 MG IJ SOLR: 40.0000 mg | Freq: Once | INTRAMUSCULAR | Status: DC

## 2024-02-20 MED ORDER — SODIUM CHLORIDE 0.9 % IV SOLN
5.0000 mg/kg | Freq: Once | INTRAVENOUS | Status: AC
  Administered 2024-02-20: 600 mg via INTRAVENOUS
  Filled 2024-02-20: qty 60

## 2024-02-20 MED ORDER — ACETAMINOPHEN 325 MG PO TABS: 650.0000 mg | ORAL_TABLET | Freq: Once | ORAL | Status: DC

## 2024-02-20 NOTE — Progress Notes (Signed)
 Diagnosis: Crohn's Disease  Provider:  Praveen Mannam MD  Procedure: IV Infusion  IV Type: Peripheral, IV Location: L Antecubital  Avsola  (infliximab -axxq), Dose: 600 mg  Infusion Start Time: 1356  Infusion Stop Time: 1620  Post Infusion IV Care: Peripheral IV Discontinued  Discharge: Condition: Good, Destination: Home . AVS Declined  Performed by:  Leita FORBES Miles, LPN

## 2024-02-24 ENCOUNTER — Other Ambulatory Visit (INDEPENDENT_AMBULATORY_CARE_PROVIDER_SITE_OTHER): Payer: Self-pay | Admitting: Family Medicine

## 2024-02-24 DIAGNOSIS — E559 Vitamin D deficiency, unspecified: Secondary | ICD-10-CM

## 2024-02-29 ENCOUNTER — Other Ambulatory Visit: Payer: Self-pay | Admitting: Hematology and Oncology

## 2024-03-06 ENCOUNTER — Other Ambulatory Visit (INDEPENDENT_AMBULATORY_CARE_PROVIDER_SITE_OTHER): Payer: Self-pay | Admitting: Family Medicine

## 2024-03-06 DIAGNOSIS — R7303 Prediabetes: Secondary | ICD-10-CM

## 2024-03-18 ENCOUNTER — Encounter (INDEPENDENT_AMBULATORY_CARE_PROVIDER_SITE_OTHER): Payer: Self-pay | Admitting: Family Medicine

## 2024-03-18 ENCOUNTER — Ambulatory Visit (INDEPENDENT_AMBULATORY_CARE_PROVIDER_SITE_OTHER): Admitting: Family Medicine

## 2024-03-18 VITALS — BP 107/70 | HR 79 | Temp 97.7°F | Ht 62.0 in | Wt 272.0 lb

## 2024-03-18 DIAGNOSIS — R7303 Prediabetes: Secondary | ICD-10-CM | POA: Diagnosis not present

## 2024-03-18 DIAGNOSIS — E7849 Other hyperlipidemia: Secondary | ICD-10-CM | POA: Diagnosis not present

## 2024-03-18 DIAGNOSIS — Z6841 Body Mass Index (BMI) 40.0 and over, adult: Secondary | ICD-10-CM

## 2024-03-18 DIAGNOSIS — E559 Vitamin D deficiency, unspecified: Secondary | ICD-10-CM

## 2024-03-18 MED ORDER — VITAMIN D (ERGOCALCIFEROL) 1.25 MG (50000 UNIT) PO CAPS: 50000.0000 [IU] | ORAL_CAPSULE | ORAL | 0 refills | Status: DC

## 2024-03-18 MED ORDER — METFORMIN HCL 500 MG PO TABS: 500.0000 mg | ORAL_TABLET | Freq: Every day | ORAL | 0 refills | Status: AC

## 2024-03-18 NOTE — Progress Notes (Signed)
 SUBJECTIVE:  Chief Complaint: Obesity  Interim History: patient is journaling and trying to get movement when and where she can.  Calorie and protein averages have been 1510, 111; 1424, 111.86; 1665, 116; 1905, 126; 1592, 94.  She hasn't been hungry habitually but occasionally is feeling hunger.  Overall has stayed very consistent with her intake.  Patient is going to [employer] for [place] with her daughter.  Usually for Thanksgiving her daughter goes to her in laws and she stays home and cooks her own Thanksgiving meal.  Patient's birthday is coming up- not anticipating doing anything for her birthday.  Sarayah is here to discuss her progress with her obesity treatment plan. She is on the keeping a food journal and adhering to recommended goals of 1700-1800 calories and 125 grams of protein and states she is following her eating plan approximately 90 % of the time. She states she is not exercising.   OBJECTIVE: Visit Diagnoses: Problem List Items Addressed This Visit   None   Vitals Temp: 97.7 F (36.5 C) BP: 107/70 Pulse Rate: 79 SpO2: 97 %   Anthropometric Measurements Height: 5' 2 (1.575 m) Weight: 272 lb (123.4 kg) BMI (Calculated): 49.74 Weight at Last Visit: 278 lb Weight Lost Since Last Visit: 6 Weight Gained Since Last Visit: 0 Starting Weight: 293 lb Total Weight Loss (lbs): 21 lb (9.526 kg)   Body Composition  Body Fat %: 49.6 % Fat Mass (lbs): 135 lbs Muscle Mass (lbs): 130.2 lbs Total Body Water  (lbs): 105.2 lbs Visceral Fat Rating : 17   Other Clinical Data Today's Visit #: 80 Starting Date: 10/02/17 Comments: 1700-1800/125     ASSESSMENT AND PLAN: Assessment & Plan Prediabetes Tolerating metformin  500 mg daily.  No nausea, cramping, bloating, diarrhea from current dosage.  Fasting for labs today.  Needs CMP, insulin  level, and hemoglobin A1c. Vitamin D  deficiency Patient has been on vitamin D  supplementation for quite some time.  Needs  repeat vitamin D  level today to assess response to treatment. Other hyperlipidemia Patient has been working on lifestyle modifications and dietary changes to help manage her cholesterol.  Will repeat fasting lipid panel today and then risk stratify patient at next appointment with new lab results. Morbid obesity (HCC)  BMI 45.0-49.9, adult (HCC)  Pre-diabetes    Diet: Tamee is currently in the action stage of change. As such, her goal is to continue with weight loss efforts and has agreed to keeping a food journal and adhering to recommended goals of 1700-1800 calories and 125 or more grams protein daily.   Exercise:  For substantial health benefits, adults should do at least 150 minutes (2 hours and 30 minutes) a week of moderate-intensity, or 75 minutes (1 hour and 15 minutes) a week of vigorous-intensity aerobic physical activity, or an equivalent combination of moderate- and vigorous-intensity aerobic activity. Aerobic activity should be performed in episodes of at least 10 minutes, and preferably, it should be spread throughout the week.  Behavior Modification:  We discussed the following Behavioral Modification Strategies today: increasing lean protein intake, decreasing simple carbohydrates, increasing vegetables, meal planning and cooking strategies, and keep a strict food journal.   No follow-ups on file.   She was informed of the importance of frequent follow up visits to maximize her success with intensive lifestyle modifications for her multiple health conditions.  Attestation Statements:   Reviewed by clinician on day of visit: allergies, medications, problem list, medical history, surgical history, family history, social history, and previous  encounter notes.   Adelita Cho, MD

## 2024-03-19 LAB — COMPREHENSIVE METABOLIC PANEL WITH GFR
ALT: 10 IU/L (ref 0–32)
AST: 16 IU/L (ref 0–40)
Albumin: 3.6 g/dL — ABNORMAL LOW (ref 3.9–4.9)
Alkaline Phosphatase: 76 IU/L (ref 41–116)
BUN/Creatinine Ratio: 19 (ref 9–23)
BUN: 14 mg/dL (ref 6–24)
Bilirubin Total: 0.3 mg/dL (ref 0.0–1.2)
CO2: 26 mmol/L (ref 20–29)
Calcium: 9.1 mg/dL (ref 8.7–10.2)
Chloride: 101 mmol/L (ref 96–106)
Creatinine, Ser: 0.74 mg/dL (ref 0.57–1.00)
Globulin, Total: 3.2 g/dL (ref 1.5–4.5)
Glucose: 83 mg/dL (ref 70–99)
Potassium: 4.4 mmol/L (ref 3.5–5.2)
Sodium: 137 mmol/L (ref 134–144)
Total Protein: 6.8 g/dL (ref 6.0–8.5)
eGFR: 105 mL/min/1.73 (ref >59)

## 2024-03-19 LAB — HEMOGLOBIN A1C
Est. average glucose Bld gHb Est-mCnc: 114 mg/dL
Hgb A1c MFr Bld: 5.6 % (ref 4.8–5.6)

## 2024-03-19 LAB — INSULIN, RANDOM: INSULIN: 8.3 u[IU]/mL (ref 2.6–24.9)

## 2024-03-19 LAB — LIPID PANEL WITH LDL/HDL RATIO
Cholesterol, Total: 149 mg/dL (ref 100–199)
HDL: 46 mg/dL (ref >39)
LDL Chol Calc (NIH): 93 mg/dL (ref 0–99)
LDL/HDL Ratio: 2 ratio (ref 0.0–3.2)
Triglycerides: 47 mg/dL (ref 0–149)
VLDL Cholesterol Cal: 10 mg/dL (ref 5–40)

## 2024-03-19 LAB — VITAMIN D 25 HYDROXY (VIT D DEFICIENCY, FRACTURES): Vit D, 25-Hydroxy: 54.7 ng/mL (ref 30.0–100.0)

## 2024-03-20 ENCOUNTER — Ambulatory Visit (INDEPENDENT_AMBULATORY_CARE_PROVIDER_SITE_OTHER)

## 2024-03-20 VITALS — BP 106/68 | HR 72 | Temp 97.9°F | Resp 18 | Ht 62.0 in | Wt 279.0 lb

## 2024-03-20 DIAGNOSIS — Z8719 Personal history of other diseases of the digestive system: Secondary | ICD-10-CM | POA: Diagnosis not present

## 2024-03-20 DIAGNOSIS — K5000 Crohn's disease of small intestine without complications: Secondary | ICD-10-CM | POA: Diagnosis not present

## 2024-03-20 MED ORDER — DIPHENHYDRAMINE HCL 25 MG PO CAPS: 25.0000 mg | ORAL_CAPSULE | Freq: Once | ORAL | Status: DC

## 2024-03-20 MED ORDER — METHYLPREDNISOLONE SODIUM SUCC 40 MG IJ SOLR: 40.0000 mg | Freq: Once | INTRAMUSCULAR | Status: DC

## 2024-03-20 MED ORDER — SODIUM CHLORIDE 0.9 % IV SOLN
5.0000 mg/kg | Freq: Once | INTRAVENOUS | Status: AC
  Administered 2024-03-20: 600 mg via INTRAVENOUS
  Filled 2024-03-20: qty 60

## 2024-03-20 MED ORDER — ACETAMINOPHEN 325 MG PO TABS: 650.0000 mg | ORAL_TABLET | Freq: Once | ORAL | Status: DC

## 2024-03-20 NOTE — Progress Notes (Signed)
 Diagnosis: Crohn's Disease  Provider:  Praveen Mannam MD  Procedure: IV Infusion  IV Type: Peripheral, IV Location: L Antecubital  Avsola  (infliximab -axxq), Dose: 600 mg  Infusion Start Time: 1354  Infusion Stop Time: 1607  Post Infusion IV Care: Patient declined observation and Peripheral IV Discontinued  Discharge: Condition: Good, Destination: Home . AVS Declined  Performed by:  Leita FORBES Miles, LPN

## 2024-03-27 NOTE — Assessment & Plan Note (Signed)
 Tolerating metformin  500 mg daily.  No nausea, cramping, bloating, diarrhea from current dosage.  Fasting for labs today.  Needs CMP, insulin  level, and hemoglobin A1c.

## 2024-03-27 NOTE — Assessment & Plan Note (Signed)
 Patient has been working on lifestyle modifications and dietary changes to help manage her cholesterol.  Will repeat fasting lipid panel today and then risk stratify patient at next appointment with new lab results.

## 2024-03-27 NOTE — Assessment & Plan Note (Signed)
 Patient has been on vitamin D  supplementation for quite some time.  Needs repeat vitamin D  level today to assess response to treatment.

## 2024-04-02 ENCOUNTER — Encounter (INDEPENDENT_AMBULATORY_CARE_PROVIDER_SITE_OTHER): Payer: Self-pay | Admitting: Family Medicine

## 2024-04-02 ENCOUNTER — Other Ambulatory Visit (INDEPENDENT_AMBULATORY_CARE_PROVIDER_SITE_OTHER): Payer: Self-pay

## 2024-04-02 DIAGNOSIS — E559 Vitamin D deficiency, unspecified: Secondary | ICD-10-CM

## 2024-04-02 MED ORDER — VITAMIN D (ERGOCALCIFEROL) 1.25 MG (50000 UNIT) PO CAPS: 50000.0000 [IU] | ORAL_CAPSULE | ORAL | 0 refills | Status: DC

## 2024-04-17 ENCOUNTER — Ambulatory Visit (INDEPENDENT_AMBULATORY_CARE_PROVIDER_SITE_OTHER)

## 2024-04-17 VITALS — BP 105/71 | HR 78 | Temp 98.2°F | Resp 20 | Ht 62.0 in | Wt 280.2 lb

## 2024-04-17 DIAGNOSIS — Z8719 Personal history of other diseases of the digestive system: Secondary | ICD-10-CM | POA: Diagnosis not present

## 2024-04-17 DIAGNOSIS — K5000 Crohn's disease of small intestine without complications: Secondary | ICD-10-CM

## 2024-04-17 MED ORDER — ACETAMINOPHEN 325 MG PO TABS: 650.0000 mg | ORAL_TABLET | Freq: Once | ORAL | Status: DC

## 2024-04-17 MED ORDER — DIPHENHYDRAMINE HCL 25 MG PO CAPS: 25.0000 mg | ORAL_CAPSULE | Freq: Once | ORAL | Status: DC

## 2024-04-17 MED ORDER — SODIUM CHLORIDE 0.9 % IV SOLN
5.0000 mg/kg | Freq: Once | INTRAVENOUS | Status: AC
  Administered 2024-04-17: 600 mg via INTRAVENOUS
  Filled 2024-04-17: qty 60

## 2024-04-17 MED ORDER — METHYLPREDNISOLONE SODIUM SUCC 40 MG IJ SOLR: 40.0000 mg | Freq: Once | INTRAMUSCULAR | Status: DC

## 2024-04-17 NOTE — Progress Notes (Signed)
 Diagnosis: Crohn's Disease  Provider:  Praveen Mannam MD  Procedure: IV Infusion  IV Type: Peripheral, IV Location: L Antecubital  Avsola  (infliximab -axxq), Dose: 600mg   Infusion Start Time: 1343  Infusion Stop Time: 1600  Post Infusion IV Care: Peripheral IV Discontinued  Discharge: Condition: Stable, Destination: Home . AVS Declined  Performed by:  Tramaine Sauls, RN

## 2024-04-22 ENCOUNTER — Ambulatory Visit (INDEPENDENT_AMBULATORY_CARE_PROVIDER_SITE_OTHER): Payer: Self-pay | Admitting: Family Medicine

## 2024-04-22 VITALS — BP 98/64 | HR 76 | Temp 98.0°F | Ht 62.0 in | Wt 270.0 lb

## 2024-04-22 DIAGNOSIS — R7303 Prediabetes: Secondary | ICD-10-CM | POA: Diagnosis not present

## 2024-04-22 DIAGNOSIS — Z6841 Body Mass Index (BMI) 40.0 and over, adult: Secondary | ICD-10-CM

## 2024-04-22 DIAGNOSIS — E7849 Other hyperlipidemia: Secondary | ICD-10-CM

## 2024-04-22 NOTE — Progress Notes (Signed)
 SUBJECTIVE:  Chief Complaint: Obesity  Interim History: Patient brought food log today.  [name] mentions [name] was a bit off calorie wise and/ or protein.  [name] is averaging 1700-1800 calories and 100-130s calories.  [name] had a few outlier averages.  The week of her birthday [name] had a few reward desserts and meals.  The last 7 days [name] hasn't felt hungry but ate more snacks than normal.  For Thanksgiving [name] is cooking for herself and isn't sure what that will be yet.  For December [name] has 3 weeks of work then Christmas.  Otherwise [name] does not have anything else planned.  Journaling will be the easiest to stay consistent with.  [name] is not anticipating many obstacles.     [name] is here to discuss her progress with her obesity treatment plan. [name] is on the keeping a food journal and adhering to recommended goals of 1700-1800 calories and 125 grams of protein and states [name] is following her eating plan approximately 75-80 % of the time. [name] states [name] is exercising 5 minutes 3 times per week.   OBJECTIVE: Visit Diagnoses: Problem List Items Addressed This Visit   None   Vitals Temp: 98 F (36.7 C) BP: 98/64 Pulse Rate: 76 SpO2: 97 %   Anthropometric Measurements Height: 5' 2 (1.575 m) Weight: 270 lb (122.5 kg) BMI (Calculated): 49.37 Weight at Last Visit: 272 lb Weight Lost Since Last Visit: 2 Weight Gained Since Last Visit: 0 Starting Weight: 293 lb Total Weight Loss (lbs): 23 lb (10.4 kg)   Body Composition  Body Fat %: 50.1 % Fat Mass (lbs): 135.6 lbs Muscle Mass (lbs): 128.4 lbs Total Body Water  (lbs): 104 lbs Visceral Fat Rating : 17   Other Clinical Data Today's Visit #: 66 Starting Date: 10/02/17 Comments: 1700-1800/125     ASSESSMENT AND PLAN: Assessment & Plan Prediabetes Patient tolerating 500 mg of metformin  daily with no GI side effects.  A1c done at last appointment showing significant improvement from 6.2 down to 5.6 which is out of prediabetic range.   Patient has been working on limiting simple carbohydrates and staying consistent within her calorie and protein goals daily.  Continue current treatment and no refill of metformin  needed today. Other hyperlipidemia LDL from last appointment at 93.  This is improved from 104 at prior labs.  HDL has decreased from 52 down to 46 which is expected with weight loss.  Patient to continue lifestyle modification which includes increasing physical activity and logging food intake daily. BMI 45.0-49.9, adult [facility])  Morbid obesity (HCC)    Diet: [name] is currently in the action stage of change. As such, her goal is to continue with weight loss efforts and has agreed to keeping a food journal and adhering to recommended goals of 1700-1800 calories and 125 or more grams of protein daily.   Exercise:  For substantial health benefits, adults should do at least 150 minutes (2 hours and 30 minutes) a week of moderate-intensity, or 75 minutes (1 hour and 15 minutes) a week of vigorous-intensity aerobic physical activity, or an equivalent combination of moderate- and vigorous-intensity aerobic activity. Aerobic activity should be performed in episodes of at least 10 minutes, and preferably, it should be spread throughout the week.  Behavior Modification:  We discussed the following Behavioral Modification Strategies today: increasing lean protein intake, decreasing simple carbohydrates, increasing vegetables, meal planning and cooking strategies, planning for success, and keep a strict food journal.   Follow-up in 6 weeks  [name]  was informed of the importance of frequent follow up visits to maximize her success with intensive lifestyle modifications for her multiple health conditions.  Attestation Statements:   Reviewed by clinician on day of visit: allergies, medications, problem list, medical history, surgical history, family history, social history, and previous encounter notes.   Donna Cho, MD

## 2024-04-27 NOTE — Assessment & Plan Note (Signed)
 Patient tolerating 500 mg of metformin  daily with no GI side effects.  A1c done at last appointment showing significant improvement from 6.2 down to 5.6 which is out of prediabetic range.  Patient has been working on limiting simple carbohydrates and staying consistent within her calorie and protein goals daily.  Continue current treatment and no refill of metformin  needed today.

## 2024-04-27 NOTE — Assessment & Plan Note (Signed)
 LDL from last appointment at 93.  This is improved from 104 at prior labs.  HDL has decreased from 52 down to 46 which is expected with weight loss.  Patient to continue lifestyle modification which includes increasing physical activity and logging food intake daily.

## 2024-05-18 ENCOUNTER — Ambulatory Visit

## 2024-05-18 VITALS — BP 103/67 | HR 81 | Temp 97.8°F | Resp 20 | Ht 62.0 in | Wt 276.8 lb

## 2024-05-18 DIAGNOSIS — Z8719 Personal history of other diseases of the digestive system: Secondary | ICD-10-CM | POA: Diagnosis not present

## 2024-05-18 DIAGNOSIS — K5000 Crohn's disease of small intestine without complications: Secondary | ICD-10-CM

## 2024-05-18 MED ORDER — DIPHENHYDRAMINE HCL 25 MG PO CAPS
25.0000 mg | ORAL_CAPSULE | Freq: Once | ORAL | Status: DC
  Filled 2024-05-18: qty 1

## 2024-05-18 MED ORDER — SODIUM CHLORIDE 0.9 % IV SOLN
5.0000 mg/kg | Freq: Once | INTRAVENOUS | Status: AC
  Administered 2024-05-18: 600 mg via INTRAVENOUS
  Filled 2024-05-18: qty 60

## 2024-05-18 MED ORDER — METHYLPREDNISOLONE SODIUM SUCC 40 MG IJ SOLR
40.0000 mg | Freq: Once | INTRAMUSCULAR | Status: DC
  Filled 2024-05-18: qty 1

## 2024-05-18 MED ORDER — ACETAMINOPHEN 325 MG PO TABS
650.0000 mg | ORAL_TABLET | Freq: Once | ORAL | Status: DC
  Filled 2024-05-18: qty 2

## 2024-05-18 NOTE — Progress Notes (Signed)
 Diagnosis: Crohn's Disease  Provider:  Mannam, Praveen MD  Procedure: IV Infusion  IV Type: Peripheral, IV Location: L Antecubital  Avsola  (infliximab -axxq), Dose: 600 mg  Infusion Start Time: 1042  Infusion Stop Time: 1301  Post Infusion IV Care: Peripheral IV Discontinued  Discharge: Condition: Stable, Destination: Home . AVS Declined  Performed by:  Rocky FORBES Sar, RN

## 2024-05-26 ENCOUNTER — Other Ambulatory Visit: Payer: Self-pay | Admitting: *Deleted

## 2024-05-26 DIAGNOSIS — D509 Iron deficiency anemia, unspecified: Secondary | ICD-10-CM

## 2024-05-27 ENCOUNTER — Telehealth: Payer: Self-pay

## 2024-05-27 NOTE — Telephone Encounter (Signed)
 Pt confirmed appt on 1/2

## 2024-05-28 ENCOUNTER — Telehealth: Payer: Self-pay | Admitting: Pharmacy Technician

## 2024-05-28 NOTE — Telephone Encounter (Signed)
 Auth Submission: APPROVED Site of care: Site of care: [facility] Payer: [employer] Medication & CPT/J Code(s) submitted: Avsola  (infliximab -axxq) V4878 Diagnosis Code: K50.00 Route of submission (phone, fax, portal): LATENT Phone # Fax # Auth type: Buy/Bill PB Units/visits requested: Q4WKS Reference number: [id number] Approval from: 05/22/24 to 05/22/25

## 2024-05-29 ENCOUNTER — Inpatient Hospital Stay: Attending: Hematology and Oncology

## 2024-05-29 ENCOUNTER — Inpatient Hospital Stay: Admitting: Hematology and Oncology

## 2024-05-29 VITALS — BP 117/58 | HR 75 | Temp 97.8°F | Resp 18 | Wt 274.9 lb

## 2024-05-29 DIAGNOSIS — Z800 Family history of malignant neoplasm of digestive organs: Secondary | ICD-10-CM | POA: Diagnosis not present

## 2024-05-29 DIAGNOSIS — K5090 Crohn's disease, unspecified, without complications: Secondary | ICD-10-CM | POA: Diagnosis not present

## 2024-05-29 DIAGNOSIS — D509 Iron deficiency anemia, unspecified: Secondary | ICD-10-CM

## 2024-05-29 LAB — CBC WITH DIFFERENTIAL (CANCER CENTER ONLY)
Abs Immature Granulocytes: 0.04 K/uL (ref 0.00–0.07)
Basophils Absolute: 0 K/uL (ref 0.0–0.1)
Basophils Relative: 0 %
Eosinophils Absolute: 0.3 K/uL (ref 0.0–0.5)
Eosinophils Relative: 3 %
HCT: 40.5 % (ref 36.0–46.0)
Hemoglobin: 13.1 g/dL (ref 12.0–15.0)
Immature Granulocytes: 0 %
Lymphocytes Relative: 27 %
Lymphs Abs: 2.7 K/uL (ref 0.7–4.0)
MCH: 28.7 pg (ref 26.0–34.0)
MCHC: 32.3 g/dL (ref 30.0–36.0)
MCV: 88.6 fL (ref 80.0–100.0)
Monocytes Absolute: 0.9 K/uL (ref 0.1–1.0)
Monocytes Relative: 8 %
Neutro Abs: 6.3 K/uL (ref 1.7–7.7)
Neutrophils Relative %: 62 %
Platelet Count: 354 K/uL (ref 150–400)
RBC: 4.57 MIL/uL (ref 3.87–5.11)
RDW: 14.6 % (ref 11.5–15.5)
WBC Count: 10.2 K/uL (ref 4.0–10.5)
nRBC: 0 % (ref 0.0–0.2)

## 2024-05-29 LAB — CMP (CANCER CENTER ONLY)
ALT: 11 U/L (ref 0–44)
AST: 16 U/L (ref 15–41)
Albumin: 3.8 g/dL (ref 3.5–5.0)
Alkaline Phosphatase: 86 U/L (ref 38–126)
Anion gap: 8 (ref 5–15)
BUN: 13 mg/dL (ref 6–20)
CO2: 28 mmol/L (ref 22–32)
Calcium: 9.2 mg/dL (ref 8.9–10.3)
Chloride: 101 mmol/L (ref 98–111)
Creatinine: 0.71 mg/dL (ref 0.44–1.00)
GFR, Estimated: >60 mL/min
Glucose, Bld: 93 mg/dL (ref 70–99)
Potassium: 4.2 mmol/L (ref 3.5–5.1)
Sodium: 137 mmol/L (ref 135–145)
Total Bilirubin: 0.3 mg/dL (ref 0.0–1.2)
Total Protein: 7.5 g/dL (ref 6.5–8.1)

## 2024-05-29 LAB — FERRITIN: Ferritin: 32 ng/mL (ref 11–307)

## 2024-05-29 LAB — IRON AND IRON BINDING CAPACITY (CC-WL,HP ONLY)
Iron: 32 ug/dL (ref 28–170)
Saturation Ratios: 11 % (ref 10.4–31.8)
TIBC: 281 ug/dL (ref 250–450)
UIBC: 249 ug/dL

## 2024-05-29 NOTE — Progress Notes (Signed)
 [facility] CONSULT NOTE  Patient Care Team: Panosh, Apolinar POUR, MD as PCP - General (Internal Medicine) Teressa Toribio SQUIBB, MD (Inactive) (Gastroenterology) Henry Slough, MD (Obstetrics and Gynecology) Mai Lynwood FALCON, MD (Rheumatology) Charlanne Groom, MD as Consulting Physician (Gastroenterology) Loretha Ash, [name] Loretha Ash, MD as Consulting Physician (Hematology and Oncology)  CHIEF COMPLAINTS/PURPOSE OF CONSULTATION:  Anemia  ASSESSMENT & PLAN:   This is a very pleasant 44 year old female patient with Crohn's disease, severe anemia with multiple ER visits and hospitalizations recently, currently on infliximab  for the past several years with great response referred to hematology here for follow up regarding severe anemia.   Assessment and Plan Assessment & Plan  Iron  deficiency anemia Iron  deficiency anemia is well-controlled with normal hemoglobin and no symptoms. Regular menstruation may increase iron  needs. - Reviewed stable hemoglobin and CBC. - Continue oral iron  supplementation once daily in the morning. - Discussed alternate day dosing as acceptable if labs from today are satisfactory. - Ordered iron  studies; will follow up on results. - Advised to contact office if anemia symptoms develop. - Plan to follow up in one year if iron  studies stable.  Crohn's disease Crohn's disease is well-controlled on infliximab  with no exacerbations or new symptoms. - Confirmed continued infliximab  therapy. - Reminded to schedule colonoscopy with Dr. Washington. - Plan to continue current management and follow up as needed.  HISTORY OF PRESENTING ILLNESS:  [name] 44 y.o. female is here because of anemia and thrombocytosis.  Discussed the use of AI scribe software for clinical note transcription with the patient, who gave verbal consent to proceed.  History of Present Illness  [name] is a 44 year old  female with Crohn's disease and iron  deficiency anemia who presents for routine hematology follow-up to monitor anemia.  She has iron  deficiency anemia managed with daily oral iron  supplementation. She denies symptoms of anemia, including pica, lightheadedness, or decreased energy, and reports no limitations in daily activities. Hemoglobin remains stable at 13.1 g/dL since July. She menstruates regularly, which may contribute to ongoing iron  requirements.  She reports no exacerbations or new gastrointestinal symptoms of Crohn's disease since 2023 and continues infliximab  therapy.  She has not initiated any new medications since her last visit and continues infliximab , oral iron , metformin , and vitamin D . No new health concerns or medication changes were discussed.  Rest of the pertinent 10 point ROS reviewed and negative  MEDICAL HISTORY:  Past Medical History:  Diagnosis Date   Abrasion of upper arm with infection, left, sequela    started antibiotics lef arm oct 4th started antibiotics healing well   Anemia    hx of 2 yrs ago   Blood transfusion without reported diagnosis    Chicken pox    Chronic rhinitis    Contact dermatitis and other eczema, due to unspecified cause    Crohn's 11/15/2011   no issues in 2 yrs   Fibroids 11/15/2011   remains with fibroids   Hernia    Incisional hernia, RLQ 10/02/2011   Infertility, female    Mononucleosis    Obesity    Pilonidal abscess    Rectal abscess    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HCC)    Vitamin D  deficiency     SURGICAL HISTORY: Past Surgical History:  Procedure Laterality Date   APPENDECTOMY  11/2004   BIOPSY  01/25/2022   Procedure: BIOPSY;  Surgeon: Charlanne Groom, MD;  Location: [facility];  Service: Gastroenterology;;  BIOPSY  01/26/2022   Procedure: BIOPSY;  Surgeon: Charlanne Groom, MD;  Location: [facility];  Service: Gastroenterology;;   BIOPSY  01/24/2023   Procedure: BIOPSY;  Surgeon: Wilhelmenia Aloha Raddle., MD;  Location: [facility];  Service: Gastroenterology;;   CESAREAN SECTION N/A 11/30/2020   Procedure: CESAREAN SECTION;  Surgeon: Henry Slough, MD;  Location: [facility];  Service: Obstetrics;  Laterality: N/A;   COLONOSCOPY     COLONOSCOPY WITH PROPOFOL  N/A 03/07/2017   Procedure: COLONOSCOPY WITH PROPOFOL ;  Surgeon: Teressa Toribio SQUIBB, MD;  Location: [facility];  Service: Endoscopy;  Laterality: N/A;   COLONOSCOPY WITH PROPOFOL  N/A 01/26/2022   Procedure: COLONOSCOPY WITH PROPOFOL ;  Surgeon: Charlanne Groom, MD;  Location: [facility];  Service: Gastroenterology;  Laterality: N/A;   COLONOSCOPY WITH PROPOFOL  N/A 01/24/2023   Procedure: COLONOSCOPY WITH PROPOFOL ;  Surgeon: Mansouraty, Aloha Raddle., MD;  Location: [facility];  Service: Gastroenterology;  Laterality: N/A;   ESOPHAGOGASTRODUODENOSCOPY (EGD) WITH PROPOFOL  N/A 01/25/2022   Procedure: ESOPHAGOGASTRODUODENOSCOPY (EGD) WITH PROPOFOL ;  Surgeon: Charlanne Groom, MD;  Location: [facility];  Service: Gastroenterology;  Laterality: N/A;   EYE SURGERY  11/15/2011   2003-multiple stys   ileocecal resection  05/2009   crohns disease with fisula/stricture. Dr. Jeoffrey Dawn   INCISIONAL HERNIA REPAIR N/A 12/24/2018   Procedure: OPEN REPAIR VENTRAL INCISIONAL HERNIA WITH MESH PATCH AND PRIMARY REPAIR OF LEFT SPIGELIAN HERNIA;  Surgeon: Eletha Boas, MD;  Location: [facility];  Service: General;  Laterality: N/A;   KNEE ARTHROSCOPY  99, 02   Bil.   MYOMECTOMY N/A 11/10/2014   Procedure: MYOMECTOMY;  Surgeon: Slough Henry, MD;  Location: [facility];  Service: Gynecology;  Laterality: N/A;   PILONIDAL CYST EXCISION  2011, 2012   I&Ds   UPPER GASTROINTESTINAL ENDOSCOPY     VENTRAL HERNIA REPAIR  11/27/2011   Procedure: LAPAROSCOPIC VENTRAL HERNIA;  Surgeon: Elspeth KYM Schultze, MD;  Location: [facility];  Service: General;  Laterality: N/A;  Laparoscopic Exploration & Repair of Hernia in Abdomen   WISDOM TOOTH EXTRACTION      SOCIAL  HISTORY: Social History   Socioeconomic History   Marital status: Married    Spouse name: Jocilyn Trego   Number of children: Not on file   Years of education: Not on file   Highest education level: Bachelor's degree (e.g., BA, AB, BS)  Occupational History   Occupation: editor, commissioning  Tobacco Use   Smoking status: Never   Smokeless tobacco: Never  Vaping Use   Vaping status: Never Used  Substance and Sexual Activity   Alcohol use: No   Drug use: No   Sexual activity: Yes    Partners: Male  Other Topics Concern   Not on file  Social History Narrative   married   HH of 2   Occupation: editor, 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    No pets   Sleep 6-7 hours    BF with 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   Walking exercise   Eats balanced generally   Social Drivers of Health   Tobacco Use: Low Risk (03/18/2024)   Patient History    Smoking Tobacco Use: Never    Smokeless Tobacco Use: Never    Passive Exposure: Not on file  Financial Resource Strain: Low Risk (12/25/2023)   Overall Financial Resource Strain (CARDIA)    Difficulty of Paying Living Expenses: Not hard at all  Food Insecurity: No Food Insecurity (12/25/2023)   Epic    Worried About Running  Out of Food in the Last Year: Never true    Ran Out of Food in the Last Year: Never true  Transportation Needs: No Transportation Needs (12/25/2023)   Epic    Lack of Transportation (Medical): No    Lack of Transportation (Non-Medical): No  Physical Activity: Insufficiently Active (12/25/2023)   Exercise Vital Sign    Days of Exercise per Week: 1 day    Minutes of Exercise per Session: 70 min  Stress: No Stress Concern Present (12/25/2023)   [employer] of Occupational Health - Occupational Stress Questionnaire    Feeling of Stress: Not at all  Social Connections: Socially Integrated (12/25/2023)   Social Connection and Isolation Panel    Frequency of Communication with Friends and Family: More than three times a week     Frequency of Social Gatherings with Friends and Family: Patient declined    Attends Religious Services: More than 4 times per year    Active Member of [employer] or Organizations: Yes    Attends Banker Meetings: 1 to 4 times per year    Marital Status: Married  Catering Manager Violence: Not At Risk (12/19/2022)   Humiliation, Afraid, Rape, and Kick questionnaire    Fear of Current or Ex-Partner: No    Emotionally Abused: No    Physically Abused: No    Sexually Abused: No  Depression (PHQ2-9): Low Risk (09/04/2023)   Depression (PHQ2-9)    PHQ-2 Score: 0  Alcohol Screen: Low Risk (12/19/2022)   Alcohol Screen    Last Alcohol Screening Score (AUDIT): 0  Housing: Unknown (12/25/2023)   Epic    Unable to Pay for Housing in the Last Year: No    Number of Times Moved in the Last Year: Not on file    Homeless in the Last Year: No  Utilities: Not At Risk (12/19/2022)   AHC Utilities    Threatened with loss of utilities: No  Health Literacy: Adequate Health Literacy (12/19/2022)   B1300 Health Literacy    Frequency of need for help with medical instructions: Never    FAMILY HISTORY: Family History  Problem Relation Age of Onset   Aneurysm Mother        brain   Heart disease Mother 66       heart anyrism   Healthy Father        fairly   Diabetes Sister        half sister   Bipolar disorder Sister        half sister   Multiple sclerosis Sister    Stomach cancer Paternal Grandfather    Esophageal cancer Neg Hx    Inflammatory bowel disease Neg Hx    Liver disease Neg Hx    Pancreatic cancer Neg Hx    Rectal cancer Neg Hx    Colon cancer Neg Hx     ALLERGIES:  is allergic to ibuprofen  and naproxen .  MEDICATIONS:  Current Outpatient Medications  Medication Sig Dispense Refill   Calcium  Carb-Cholecalciferol  (CALCIUM /VITAMIN D ) 600-400 MG-UNIT TABS Take 1 tablet by mouth at bedtime.     ferrous gluconate  (FERGON) 324 MG tablet TAKE 2 TABLETS (648 MG TOTAL) BY MOUTH DAILY  WITH BREAKFAST. 180 tablet 1   fexofenadine (ALLEGRA) 180 MG tablet Take 180 mg by mouth at bedtime.     inFLIXimab -axxq (AVSOLA ) 100 MG injection Inject 1 mg into the vein. Every month     metFORMIN  (GLUCOPHAGE ) 500 MG tablet Take 1 tablet (500 mg total) by mouth  daily with breakfast. 90 tablet 0   Vitamin D , Ergocalciferol , (DRISDOL ) 1.25 MG (50000 UNIT) CAPS capsule Take 1 capsule (50,000 Units total) by mouth every 14 (fourteen) days. 4 capsule 0   No current facility-administered medications for this visit.   Facility-Administered Medications Ordered in Other Visits  Medication Dose Route Frequency Provider Last Rate Last Admin   acetaminophen  (TYLENOL ) tablet 650 mg  650 mg Oral Once Mansouraty, Aloha Raddle., MD         PHYSICAL EXAMINATION: ECOG PERFORMANCE STATUS: 0 - Asymptomatic  Vitals:   05/29/24 0943  BP: (!) 117/58  Pulse: 75  Resp: 18  Temp: 97.8 F (36.6 C)  SpO2: 100%    Filed Weights   05/29/24 0943  Weight: 274 lb 14.4 oz (124.7 kg)     Physical Exam Constitutional:      Appearance: Normal appearance.  Cardiovascular:     Rate and Rhythm: Normal rate.     Pulses: Normal pulses.  Pulmonary:     Effort: Pulmonary effort is normal.  Musculoskeletal:        General: Normal range of motion.     Cervical back: Normal range of motion. No rigidity.  Lymphadenopathy:     Cervical: No cervical adenopathy.  Skin:    General: Skin is warm and dry.  Neurological:     General: No focal deficit present.     Mental Status: She is alert.       LABORATORY DATA:  I have reviewed the data as listed Lab Results  Component Value Date   WBC 10.2 05/29/2024   HGB 13.1 05/29/2024   HCT 40.5 05/29/2024   MCV 88.6 05/29/2024   PLT 354 05/29/2024     Chemistry      Component Value Date/Time   NA 137 03/18/2024 0836   K 4.4 03/18/2024 0836   CL 101 03/18/2024 0836   CO2 26 03/18/2024 0836   BUN 14 03/18/2024 0836   CREATININE 0.74 03/18/2024 0836    CREATININE 0.66 11/26/2023 0922      Component Value Date/Time   CALCIUM  9.1 03/18/2024 0836   CALCIUM  7.6 (L) 01/25/2022 0328   ALKPHOS 76 03/18/2024 0836   AST 16 03/18/2024 0836   AST 13 (L) 11/26/2023 0922   ALT 10 03/18/2024 0836   ALT 12 11/26/2023 0922   BILITOT 0.3 03/18/2024 0836   BILITOT 0.3 11/26/2023 0922       RADIOGRAPHIC STUDIES: I have personally reviewed the radiological images as listed and agreed with the findings in the report. No results found.  All questions were answered. The patient knows to call the clinic with any problems, questions or concerns. I spent 20 minutes in the care of this patient including H and P, review of records, counseling and coordination of care.     Amber Stalls, MD 05/29/2024 9:46 AM

## 2024-06-02 ENCOUNTER — Ambulatory Visit: Payer: Self-pay | Admitting: Hematology and Oncology

## 2024-06-03 ENCOUNTER — Encounter: Payer: Self-pay | Admitting: Gastroenterology

## 2024-06-03 ENCOUNTER — Encounter: Payer: Self-pay | Admitting: Hematology and Oncology

## 2024-06-03 NOTE — Telephone Encounter (Signed)
-----   Message from Amber Stalls, MD sent at 06/02/2024  8:32 PM EST ----- Ferritin is stable, ok to continue iron  every other day.  Thanks,

## 2024-06-03 NOTE — Telephone Encounter (Signed)
 Pt made aware of results and recommendations. She knows to call with any concerns.

## 2024-06-04 ENCOUNTER — Encounter (INDEPENDENT_AMBULATORY_CARE_PROVIDER_SITE_OTHER): Payer: Self-pay | Admitting: Family Medicine

## 2024-06-04 ENCOUNTER — Ambulatory Visit (INDEPENDENT_AMBULATORY_CARE_PROVIDER_SITE_OTHER): Payer: Self-pay | Admitting: Family Medicine

## 2024-06-04 VITALS — BP 100/65 | HR 89 | Temp 97.5°F | Ht 62.0 in | Wt 271.0 lb

## 2024-06-04 DIAGNOSIS — Z6841 Body Mass Index (BMI) 40.0 and over, adult: Secondary | ICD-10-CM

## 2024-06-04 DIAGNOSIS — E559 Vitamin D deficiency, unspecified: Secondary | ICD-10-CM | POA: Diagnosis not present

## 2024-06-04 DIAGNOSIS — D509 Iron deficiency anemia, unspecified: Secondary | ICD-10-CM

## 2024-06-04 MED ORDER — VITAMIN D (ERGOCALCIFEROL) 1.25 MG (50000 UNIT) PO CAPS: 50000.0000 [IU] | ORAL_CAPSULE | ORAL | 0 refills | Status: AC

## 2024-06-04 NOTE — Progress Notes (Signed)
 "  SUBJECTIVE:  Chief Complaint: Obesity  Interim History: Patient mentions when she looks back on her journaling number she was between 1800-2000 calories and she was getting in protein but her carb intake was higher over the last few weeks.   Over the course of the next month she mentions she does not have much going on.  She is proud of herself for enjoying her indulgences but then getting back on track after Valentine's Day.   [name] is here to discuss her progress with her obesity treatment plan. She is on the keeping a food journal and adhering to recommended goals of 1700-1800 calories and 125 grams of protein and states she is following her eating plan approximately 65 % of the time. She states she is exercising 10 minutes 1 times per week.   OBJECTIVE: Visit Diagnoses: Problem List Items Addressed This Visit       Other   Vitamin D  deficiency - Primary   Relevant Medications   Vitamin D , Ergocalciferol , (DRISDOL ) 1.25 MG (50000 UNIT) CAPS capsule   IDA (iron  deficiency anemia)   Morbid obesity (HCC)   Other Visit Diagnoses       BMI 45.0-49.9, adult (HCC)           Vitals Temp: (!) 97.5 F (36.4 C) BP: 100/65 Pulse Rate: 89 SpO2: 97 %   Anthropometric Measurements Height: 5' 2 (1.575 m) Weight: 271 lb (122.9 kg) BMI (Calculated): 49.55 Weight at Last Visit: 272 lb Weight Lost Since Last Visit: 0 Weight Gained Since Last Visit: 1 Starting Weight: 293 lb Total Weight Loss (lbs): 22 lb (9.979 kg)   Body Composition  Body Fat %: 51.6 % Fat Mass (lbs): 140.2 lbs Muscle Mass (lbs): 125 lbs Total Body Water  (lbs): 103 lbs Visceral Fat Rating : 18   Other Clinical Data Today's Visit #: 97 Starting Date: 10/02/17 Comments: 1700-1800/125     ASSESSMENT AND PLAN: Assessment & Plan Vitamin D  deficiency Doing well on prescription strength Vitamin D .  No nausea, vomiting or muscle weakness.  Needs refill today for prescription strength Vitamin D  every 2  weeks.  Will repeat vitamin d  level next appointment to ensure no over supplementation. Iron  deficiency anemia, unspecified iron  deficiency anemia type Recent labs are within normal limits.  Patient reports iron  supplementation has been changed to every other day not.  Will follow up on repeat labs in 3 months. Morbid obesity (HCC)  BMI 45.0-49.9, adult (HCC)    Diet: [name] is currently in the action stage of change. As such, her goal is to continue with weight loss efforts and has agreed to keeping a food journal and adhering to recommended goals of 1700-1800 calories and 125 or more grams of protein daily.  Patient to start food log or journaling meal plan.  The initial goal will be to habitually log or journal for at least 4 days a week.  The expectation it that patient may not initially meet calorie or protein goals as the nutritional understanding of food intake is begun.  We discussed the 10:1 ratio when reading a food label.  Patient agrees to keep a food log either electronically or on paper and bring to the next appointment to be able to dissect and discuss it with provider.     Exercise:  All adults should avoid inactivity. Some activity is better than none, and adults who participate in any amount of physical activity, gain some health benefits.  Patient to message in two weeks to follow  up on quantity of physical activity she has completed.  Behavior Modification:  We discussed the following Behavioral Modification Strategies today: increasing lean protein intake, decreasing simple carbohydrates, meal planning and cooking strategies, planning for success, and keep a strict food journal.   Return in about 4 weeks (around 07/02/2024) for fasting labs and IC.   She was informed of the importance of frequent follow up visits to maximize her success with intensive lifestyle modifications for her multiple health conditions.  Attestation Statements:   Reviewed by clinician on day of  visit: allergies, medications, problem list, medical history, surgical history, family history, social history, and previous encounter notes.     Donna Cho, MD "

## 2024-06-04 NOTE — Assessment & Plan Note (Signed)
 Doing well on prescription strength Vitamin D .  No nausea, vomiting or muscle weakness.  Needs refill today for prescription strength Vitamin D  every 2 weeks.  Will repeat vitamin d  level next appointment to ensure no over supplementation.

## 2024-06-04 NOTE — Assessment & Plan Note (Signed)
 Recent labs are within normal limits.  Patient reports iron  supplementation has been changed to every other day not.  Will follow up on repeat labs in 3 months.

## 2024-06-11 ENCOUNTER — Encounter: Payer: Self-pay | Admitting: Internal Medicine

## 2024-06-16 ENCOUNTER — Ambulatory Visit

## 2024-06-17 ENCOUNTER — Ambulatory Visit

## 2024-06-17 VITALS — BP 99/57 | HR 84 | Temp 97.4°F | Resp 22 | Ht 63.0 in | Wt 277.2 lb

## 2024-06-17 DIAGNOSIS — Z8719 Personal history of other diseases of the digestive system: Secondary | ICD-10-CM

## 2024-06-17 DIAGNOSIS — K5000 Crohn's disease of small intestine without complications: Secondary | ICD-10-CM

## 2024-06-17 MED ORDER — ACETAMINOPHEN 325 MG PO TABS: 650.0000 mg | ORAL_TABLET | Freq: Once | ORAL | Status: DC

## 2024-06-17 MED ORDER — DIPHENHYDRAMINE HCL 25 MG PO CAPS: 25.0000 mg | ORAL_CAPSULE | Freq: Once | ORAL | Status: DC

## 2024-06-17 MED ORDER — METHYLPREDNISOLONE SODIUM SUCC 40 MG IJ SOLR: 40.0000 mg | Freq: Once | INTRAMUSCULAR | Status: DC

## 2024-06-17 MED ORDER — SODIUM CHLORIDE 0.9 % IV SOLN
5.0000 mg/kg | Freq: Once | INTRAVENOUS | Status: AC
  Administered 2024-06-17: 600 mg via INTRAVENOUS
  Filled 2024-06-17: qty 60

## 2024-06-17 NOTE — Progress Notes (Signed)
 Diagnosis: , Crohn's Disease  Provider:  Praveen Mannam MD  Procedure: IV Infusion  IV Type: Peripheral, IV Location: R Forearm  , Avsola  (infliximab -axxq), Dose: 600 mg  Infusion Start Time: 1400  Infusion Stop Time: 1614  Post Infusion IV Care: Peripheral IV Discontinued  Discharge: Condition: Good, Destination: Home . AVS Declined  Performed by:  Donny Childes, RN

## 2024-06-17 NOTE — Telephone Encounter (Signed)
 Sorry I missed this message  Did you stop the b12?  And also  level can be done anytinme   and shouldn't be  crucial to your clinical status at this time  You could ask oncology to do with next lab  or the next time we do other  lab  However if you  are concern and want before  then ok to make a lab appt and bet a b12 level

## 2024-06-21 ENCOUNTER — Other Ambulatory Visit (INDEPENDENT_AMBULATORY_CARE_PROVIDER_SITE_OTHER): Payer: Self-pay | Admitting: Family Medicine

## 2024-06-21 DIAGNOSIS — E559 Vitamin D deficiency, unspecified: Secondary | ICD-10-CM

## 2024-07-02 ENCOUNTER — Ambulatory Visit (INDEPENDENT_AMBULATORY_CARE_PROVIDER_SITE_OTHER): Admitting: Family Medicine

## 2024-07-02 NOTE — Progress Notes (Unsigned)
 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 Wilhelmenia, who presents to clinic today to discuss colonoscopy.    05/23/2023 office visit with Dr. Wilhelmenia.  At that time doing well with 2 formed bowel movements a day.  Continued on Avsola  every 4 weeks.  At that visit recommended Avsola  10 mg/kg every 4 weeks.  Repeat fecal calprotectin in March 2025.  Consider TPMT testing.  Colonoscopy for evaluation mucosal healing August 2025.  Labs recommended in March to include fecal calprotectin, CBC, CMP, ESR, CRP, quant to [facility] TB Gold and TPMT.    03/18/2024 vitamin D  normal.    05/29/2024 CMP, CBC, iron  studies, ferritin normal.  GI Procedures and Studies  August 2024 colonoscopy - Hemorrhoids found on digital rectal exam. - Patent functional end-to-end ileo-colonic anastomosis, characterized by mostly healthy appearing mucosa, though there were a few erosions and 1 ulcer in the base. Biopsied. - The examined portion of the neoterminal was normal. Biopsied. - Normal mucosa in the entire examined colon. Biopsied both the right and left colon. - Normal mucosa in the rectum. No evidence of fistula. Biopsied. - Non-bleeding non-thrombosed internal hemorrhoids.    Pathology FINAL MICROSCOPIC DIAGNOSIS:  A. NEOTERMINAL ILEO, BIOPSY:  - Ileal mucosa with no specific histopathologic changes  - Negative for acute inflammation, features of chronicity or granulomas  B. ANASTOMOSIS, BIOPSY:  - Patchy severely active chronic enteritis with focal erosion, see  comment  - Negative for granulomas or dysplasia  C. COLON, RIGHT, BIOPSY:  - Colonic mucosa with no specific histopathologic changes  - Negative for acute inflammation, features of chronicity,  granulomas or  dysplasia  D. COLON, LEFT, BIOPSY:  - Colonic mucosa with no specific histopathologic changes  - Negative for acute inflammation, features of chronicity, granulomas or  dysplasia  E. RECTAL COLON, BIOPSY:  - Colonic mucosa with no specific histopathologic changes  - Negative for acute inflammation, features of chronicity, granulomas or  dysplasia    Past Medical History:  Diagnosis Date   Abrasion of upper arm with infection, left, sequela    started antibiotics lef arm oct 4th started antibiotics healing well   Anemia    hx of 2 yrs ago   Blood transfusion without reported diagnosis    Chicken pox    Chronic rhinitis    Contact dermatitis and other eczema, due to unspecified cause    Crohn's 11/15/2011   no issues in 2 yrs   Fibroids 11/15/2011   remains with fibroids   Hernia    Incisional hernia, RLQ 10/02/2011   Infertility, female    Mononucleosis    Obesity    Pilonidal abscess    Rectal abscess    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HCC)    Vitamin D  deficiency     Past Surgical History:  Procedure Laterality Date   APPENDECTOMY  11/2004   BIOPSY  01/25/2022   Procedure: BIOPSY;  Surgeon: Charlanne Groom, MD;  Location: [facility];  Service: Gastroenterology;;   BIOPSY  01/26/2022   Procedure: BIOPSY;  Surgeon: Charlanne Groom, MD;  Location: [facility];  Service: Gastroenterology;;   BIOPSY  01/24/2023   Procedure: BIOPSY;  Surgeon: Wilhelmenia Aloha Raddle., MD;  Location: [facility];  Service: Gastroenterology;;   CESAREAN SECTION N/A 11/30/2020   Procedure:  CESAREAN SECTION;  Surgeon: Henry Slough, MD;  Location: [facility];  Service: Obstetrics;  Laterality: N/A;   COLONOSCOPY     COLONOSCOPY WITH PROPOFOL  N/A 03/07/2017   Procedure: COLONOSCOPY WITH PROPOFOL ;  Surgeon: Teressa Toribio SQUIBB, MD;  Location: [facility];  Service: Endoscopy;  Laterality: N/A;   COLONOSCOPY WITH PROPOFOL  N/A 01/26/2022   Procedure: COLONOSCOPY WITH PROPOFOL ;   Surgeon: Charlanne Groom, MD;  Location: [facility];  Service: Gastroenterology;  Laterality: N/A;   COLONOSCOPY WITH PROPOFOL  N/A 01/24/2023   Procedure: COLONOSCOPY WITH PROPOFOL ;  Surgeon: Mansouraty, Aloha Raddle., MD;  Location: [facility];  Service: Gastroenterology;  Laterality: N/A;   ESOPHAGOGASTRODUODENOSCOPY (EGD) WITH PROPOFOL  N/A 01/25/2022   Procedure: ESOPHAGOGASTRODUODENOSCOPY (EGD) WITH PROPOFOL ;  Surgeon: Charlanne Groom, MD;  Location: [facility];  Service: Gastroenterology;  Laterality: N/A;   EYE SURGERY  11/15/2011   2003-multiple stys   ileocecal resection  05/2009   crohns disease with fisula/stricture. Dr. Jeoffrey Dawn   INCISIONAL HERNIA REPAIR N/A 12/24/2018   Procedure: OPEN REPAIR VENTRAL INCISIONAL HERNIA WITH MESH PATCH AND PRIMARY REPAIR OF LEFT SPIGELIAN HERNIA;  Surgeon: Eletha Boas, MD;  Location: [facility];  Service: General;  Laterality: N/A;   KNEE ARTHROSCOPY  99, 02   Bil.   MYOMECTOMY N/A 11/10/2014   Procedure: MYOMECTOMY;  Surgeon: Slough Henry, MD;  Location: [facility];  Service: Gynecology;  Laterality: N/A;   PILONIDAL CYST EXCISION  2011, 2012   I&Ds   UPPER GASTROINTESTINAL ENDOSCOPY     VENTRAL HERNIA REPAIR  11/27/2011   Procedure: LAPAROSCOPIC VENTRAL HERNIA;  Surgeon: Elspeth KYM Schultze, MD;  Location: [facility];  Service: General;  Laterality: N/A;  Laparoscopic Exploration & Repair of Hernia in Abdomen   WISDOM TOOTH EXTRACTION      Current Outpatient Medications  Medication Sig Dispense Refill   Calcium  Carb-Cholecalciferol  (CALCIUM /VITAMIN D ) 600-400 MG-UNIT TABS Take 1 tablet by mouth at bedtime.     ferrous gluconate  (FERGON) 324 MG tablet TAKE 2 TABLETS (648 MG TOTAL) BY MOUTH DAILY WITH BREAKFAST. 180 tablet 1   fexofenadine (ALLEGRA) 180 MG tablet Take 180 mg by mouth at bedtime.     inFLIXimab -axxq (AVSOLA ) 100 MG injection Inject 1 mg into the vein. Every month     metFORMIN  (GLUCOPHAGE ) 500 MG tablet Take 1 tablet (500 mg total) by  mouth daily with breakfast. 90 tablet 0   Vitamin D , Ergocalciferol , (DRISDOL ) 1.25 MG (50000 UNIT) CAPS capsule Take 1 capsule (50,000 Units total) by mouth every 14 (fourteen) days. 4 capsule 0   No current facility-administered medications for this visit.   Facility-Administered Medications Ordered in Other Visits  Medication Dose Route Frequency Provider Last Rate Last Admin   acetaminophen  (TYLENOL ) tablet 650 mg  650 mg Oral Once Mansouraty, Aloha Raddle., MD        Allergies as of 07/03/2024 - Review Complete 06/04/2024  Allergen Reaction Noted   Ibuprofen  Other (See Comments) 12/19/2022   Naproxen  Other (See Comments) 01/06/2022    Family History  Problem Relation Age of Onset   Aneurysm Mother        brain   Heart disease Mother 91       heart anyrism   Healthy Father        fairly   Diabetes Sister        half sister   Bipolar disorder Sister        half sister   Multiple sclerosis Sister    Stomach cancer Paternal Grandfather  Esophageal cancer Neg Hx    Inflammatory bowel disease Neg Hx    Liver disease Neg Hx    Pancreatic cancer Neg Hx    Rectal cancer Neg Hx    Colon cancer Neg Hx     Social History   Socioeconomic History   Marital status: Married    Spouse name: Jaiyana Canale   Number of children: Not on file   Years of education: Not on file   Highest education level: Bachelor's degree (e.g., BA, AB, BS)  Occupational History   Occupation: editor, commissioning  Tobacco Use   Smoking status: Never   Smokeless tobacco: Never  Vaping Use   Vaping status: Never Used  Substance and Sexual Activity   Alcohol use: No   Drug use: No   Sexual activity: Yes    Partners: Male  Other Topics Concern   Not on file  Social History Narrative   married   HH of 2   Occupation: editor, 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    No pets   Sleep 6-7 hours    BF with 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   Walking exercise   Eats balanced generally   Social Drivers of  Health   Tobacco Use: Low Risk (06/04/2024)   Patient History    Smoking Tobacco Use: Never    Smokeless Tobacco Use: Never    Passive Exposure: Not on file  Financial Resource Strain: Low Risk (12/25/2023)   Overall Financial Resource Strain (CARDIA)    Difficulty of Paying Living Expenses: Not hard at all  Food Insecurity: No Food Insecurity (12/25/2023)   Epic    Worried About Programme Researcher, Broadcasting/film/video in the Last Year: Never true    Ran Out of Food in the Last Year: Never true  Transportation Needs: No Transportation Needs (12/25/2023)   Epic    Lack of Transportation (Medical): No    Lack of Transportation (Non-Medical): No  Physical Activity: Insufficiently Active (12/25/2023)   Exercise Vital Sign    Days of Exercise per Week: 1 day    Minutes of Exercise per Session: 70 min  Stress: No Stress Concern Present (12/25/2023)   [employer] of Occupational Health - Occupational Stress Questionnaire    Feeling of Stress: Not at all  Social Connections: Socially Integrated (12/25/2023)   Social Connection and Isolation Panel    Frequency of Communication with Friends and Family: More than three times a week    Frequency of Social Gatherings with Friends and Family: Patient declined    Attends Religious Services: More than 4 times per year    Active Member of [employer] or Organizations: Yes    Attends Banker Meetings: 1 to 4 times per year    Marital Status: Married  Catering Manager Violence: Not At Risk (12/19/2022)   Humiliation, Afraid, Rape, and Kick questionnaire    Fear of Current or Ex-Partner: No    Emotionally Abused: No    Physically Abused: No    Sexually Abused: No  Depression (PHQ2-9): Low Risk (09/04/2023)   Depression (PHQ2-9)    PHQ-2 Score: 0  Alcohol Screen: Low Risk (12/19/2022)   Alcohol Screen    Last Alcohol Screening Score (AUDIT): 0  Housing: Unknown (12/25/2023)   Epic    Unable to Pay for Housing in the Last Year: No    Number of Times Moved in  the Last Year: Not on file    Homeless in the Last Year: No  Utilities: Not At Risk (12/19/2022)   [facility] Utilities    Threatened with loss of utilities: No  Health Literacy: Adequate Health Literacy (12/19/2022)   B1300 Health Literacy    Frequency of need for help with medical instructions: Never    Review of Systems:    Constitutional: No weight loss, fever, chills, weakness or fatigue HEENT: Eyes: No change in vision               Ears, Nose, Throat:  No change in hearing or congestion Skin: No rash or itching Cardiovascular: No chest pain, chest pressure or palpitations   Respiratory: No SOB or cough Gastrointestinal: See HPI and otherwise negative Genitourinary: No dysuria or change in urinary frequency Neurological: No headache, dizziness or syncope Musculoskeletal: No new muscle or joint pain Hematologic: No bleeding or bruising Psychiatric: No history of depression or anxiety    Physical Exam:  Vital signs: There were no vitals taken for this visit.  Constitutional:   Pleasant Caucasian female appears to be in NAD, Well developed, Well nourished, alert and cooperative Head:  Normocephalic and atraumatic. Eyes:   PEERL, EOMI. No icterus. Conjunctiva pink. Ears:  Normal auditory acuity. Neck:  Supple Throat: Oral cavity and pharynx without inflammation, swelling or lesion.  Respiratory: Respirations even and unlabored. Lungs clear to auscultation bilaterally.   No wheezes, crackles, or rhonchi.  Cardiovascular: Normal S1, S2. No MRG. Regular rate and rhythm. No peripheral edema, cyanosis or pallor.  Gastrointestinal:  Soft, nondistended, nontender. No rebound or guarding. Normal bowel sounds. No appreciable masses or hepatomegaly. Rectal:  Not performed.  Msk:  Symmetrical without gross deformities. Without edema, no deformity or joint abnormality.  Neurologic:  Alert and  oriented x4;  grossly normal neurologically.  Skin:   Dry and intact without significant lesions or  rashes. Psychiatric: Oriented to person, place and time. Demonstrates good judgement and reason without abnormal affect or behaviors.  RELEVANT LABS AND IMAGING: CBC    Component Value Date/Time   WBC 10.2 05/29/2024 0917   WBC 10.7 (H) 12/18/2023 0900   RBC 4.57 05/29/2024 0917   HGB 13.1 05/29/2024 0917   HGB 11.2 10/02/2017 1245   HCT 40.5 05/29/2024 0917   HCT 36.9 10/02/2017 1245   PLT 354 05/29/2024 0917   MCV 88.6 05/29/2024 0917   MCV 82 10/02/2017 1245   MCH 28.7 05/29/2024 0917   MCHC 32.3 05/29/2024 0917   RDW 14.6 05/29/2024 0917   RDW 17.4 (H) 10/02/2017 1245   LYMPHSABS 2.7 05/29/2024 0917   LYMPHSABS 3.9 (H) 10/02/2017 1245   MONOABS 0.9 05/29/2024 0917   EOSABS 0.3 05/29/2024 0917   EOSABS 0.2 10/02/2017 1245   BASOSABS 0.0 05/29/2024 0917   BASOSABS 0.0 10/02/2017 1245    CMP     Component Value Date/Time   NA 137 05/29/2024 0917   NA 137 03/18/2024 0836   K 4.2 05/29/2024 0917   CL 101 05/29/2024 0917   CO2 28 05/29/2024 0917   GLUCOSE 93 05/29/2024 0917   BUN 13 05/29/2024 0917   BUN 14 03/18/2024 0836   CREATININE 0.71 05/29/2024 0917   CALCIUM  9.2 05/29/2024 0917   CALCIUM  7.6 (L) 01/25/2022 0328   PROT 7.5 05/29/2024 0917   PROT 6.8 03/18/2024 0836   ALBUMIN 3.8 05/29/2024 0917   ALBUMIN 3.6 (L) 03/18/2024 0836   AST 16 05/29/2024 0917   ALT 11 05/29/2024 0917   ALKPHOS 86 05/29/2024 0917   BILITOT 0.3 05/29/2024 0917   GFRNONAA >  60 05/29/2024 0917   GFRAA 139 07/06/2020 0741    Assessment: 1. ***  Plan: 1. ***     Delon Failing, PA-C [facility] 07/02/2024, 11:37 AM  Cc: Charlett Apolinar POUR, MD  "

## 2024-07-03 ENCOUNTER — Ambulatory Visit: Admitting: Physician Assistant

## 2024-07-15 ENCOUNTER — Ambulatory Visit

## 2024-07-22 ENCOUNTER — Ambulatory Visit (INDEPENDENT_AMBULATORY_CARE_PROVIDER_SITE_OTHER): Admitting: Family Medicine

## 2024-08-05 ENCOUNTER — Ambulatory Visit: Admitting: Gastroenterology

## 2024-08-13 ENCOUNTER — Ambulatory Visit (INDEPENDENT_AMBULATORY_CARE_PROVIDER_SITE_OTHER): Admitting: Family Medicine

## 2025-05-31 ENCOUNTER — Inpatient Hospital Stay: Admitting: Hematology and Oncology

## 2025-05-31 ENCOUNTER — Inpatient Hospital Stay

## 2025-06-01 ENCOUNTER — Inpatient Hospital Stay: Admitting: Hematology and Oncology

## 2025-06-01 ENCOUNTER — Inpatient Hospital Stay
